# Patient Record
Sex: Male | Born: 1950 | Race: White | Hispanic: No | Marital: Single | Smoking: Current every day smoker
Health system: Southern US, Community
[De-identification: ages and names within clinical notes are randomized; demographics above are authoritative.]

## PROBLEM LIST (undated history)

## (undated) DIAGNOSIS — I5021 Acute systolic (congestive) heart failure: Secondary | ICD-10-CM

## (undated) DIAGNOSIS — E7800 Pure hypercholesterolemia, unspecified: Secondary | ICD-10-CM

## (undated) DIAGNOSIS — Z951 Presence of aortocoronary bypass graft: Secondary | ICD-10-CM

## (undated) DIAGNOSIS — Z720 Tobacco use: Secondary | ICD-10-CM

## (undated) DIAGNOSIS — J9601 Acute respiratory failure with hypoxia: Secondary | ICD-10-CM

## (undated) DIAGNOSIS — I10 Essential (primary) hypertension: Secondary | ICD-10-CM

## (undated) DIAGNOSIS — J449 Chronic obstructive pulmonary disease, unspecified: Secondary | ICD-10-CM

## (undated) HISTORY — PX: FEMORAL ARTERY - FEMORAL ARTERY BYPASS GRAFT: SUR179

## (undated) HISTORY — PX: OTHER SURGICAL HISTORY: SHX169

## (undated) HISTORY — PX: TOE AMPUTATION: SHX809

---

## 2003-01-29 ENCOUNTER — Emergency Department (HOSPITAL_COMMUNITY): Admission: EM | Admit: 2003-01-29 | Discharge: 2003-01-29 | Payer: Self-pay | Admitting: Emergency Medicine

## 2014-02-11 ENCOUNTER — Encounter (HOSPITAL_COMMUNITY): Payer: Self-pay | Admitting: Emergency Medicine

## 2014-02-11 ENCOUNTER — Inpatient Hospital Stay (HOSPITAL_COMMUNITY)
Admission: EM | Admit: 2014-02-11 | Discharge: 2014-02-20 | DRG: 233 | Disposition: A | Discharge status: Home / Self Care | Payer: BLUE CROSS/BLUE SHIELD | Attending: Thoracic Surgery (Cardiothoracic Vascular Surgery) | Admitting: Thoracic Surgery (Cardiothoracic Vascular Surgery)

## 2014-02-11 ENCOUNTER — Emergency Department (HOSPITAL_COMMUNITY): Payer: BLUE CROSS/BLUE SHIELD

## 2014-02-11 DIAGNOSIS — F1010 Alcohol abuse, uncomplicated: Secondary | ICD-10-CM | POA: Diagnosis present

## 2014-02-11 DIAGNOSIS — J9600 Acute respiratory failure, unspecified whether with hypoxia or hypercapnia: Secondary | ICD-10-CM | POA: Diagnosis present

## 2014-02-11 DIAGNOSIS — I5043 Acute on chronic combined systolic (congestive) and diastolic (congestive) heart failure: Secondary | ICD-10-CM | POA: Diagnosis present

## 2014-02-11 DIAGNOSIS — F17210 Nicotine dependence, cigarettes, uncomplicated: Secondary | ICD-10-CM | POA: Diagnosis present

## 2014-02-11 DIAGNOSIS — J9601 Acute respiratory failure with hypoxia: Secondary | ICD-10-CM | POA: Diagnosis present

## 2014-02-11 DIAGNOSIS — R9431 Abnormal electrocardiogram [ECG] [EKG]: Secondary | ICD-10-CM | POA: Diagnosis present

## 2014-02-11 DIAGNOSIS — R778 Other specified abnormalities of plasma proteins: Secondary | ICD-10-CM | POA: Diagnosis present

## 2014-02-11 DIAGNOSIS — I5022 Chronic systolic (congestive) heart failure: Secondary | ICD-10-CM | POA: Diagnosis present

## 2014-02-11 DIAGNOSIS — I471 Supraventricular tachycardia: Secondary | ICD-10-CM

## 2014-02-11 DIAGNOSIS — E7800 Pure hypercholesterolemia, unspecified: Secondary | ICD-10-CM | POA: Diagnosis present

## 2014-02-11 DIAGNOSIS — I214 Non-ST elevation (NSTEMI) myocardial infarction: Principal | ICD-10-CM | POA: Diagnosis present

## 2014-02-11 DIAGNOSIS — I25118 Atherosclerotic heart disease of native coronary artery with other forms of angina pectoris: Secondary | ICD-10-CM

## 2014-02-11 DIAGNOSIS — E222 Syndrome of inappropriate secretion of antidiuretic hormone: Secondary | ICD-10-CM | POA: Diagnosis not present

## 2014-02-11 DIAGNOSIS — J440 Chronic obstructive pulmonary disease with acute lower respiratory infection: Secondary | ICD-10-CM | POA: Diagnosis present

## 2014-02-11 DIAGNOSIS — J9811 Atelectasis: Secondary | ICD-10-CM | POA: Diagnosis not present

## 2014-02-11 DIAGNOSIS — I2582 Chronic total occlusion of coronary artery: Secondary | ICD-10-CM | POA: Diagnosis present

## 2014-02-11 DIAGNOSIS — R0602 Shortness of breath: Secondary | ICD-10-CM | POA: Diagnosis not present

## 2014-02-11 DIAGNOSIS — I5021 Acute systolic (congestive) heart failure: Secondary | ICD-10-CM | POA: Diagnosis present

## 2014-02-11 DIAGNOSIS — E872 Acidosis, unspecified: Secondary | ICD-10-CM | POA: Diagnosis present

## 2014-02-11 DIAGNOSIS — D62 Acute posthemorrhagic anemia: Secondary | ICD-10-CM | POA: Diagnosis not present

## 2014-02-11 DIAGNOSIS — I255 Ischemic cardiomyopathy: Secondary | ICD-10-CM | POA: Diagnosis present

## 2014-02-11 DIAGNOSIS — I509 Heart failure, unspecified: Secondary | ICD-10-CM | POA: Diagnosis present

## 2014-02-11 DIAGNOSIS — I10 Essential (primary) hypertension: Secondary | ICD-10-CM | POA: Diagnosis present

## 2014-02-11 DIAGNOSIS — J209 Acute bronchitis, unspecified: Secondary | ICD-10-CM | POA: Diagnosis present

## 2014-02-11 DIAGNOSIS — Z720 Tobacco use: Secondary | ICD-10-CM | POA: Diagnosis present

## 2014-02-11 DIAGNOSIS — I2699 Other pulmonary embolism without acute cor pulmonale: Secondary | ICD-10-CM

## 2014-02-11 DIAGNOSIS — Z79899 Other long term (current) drug therapy: Secondary | ICD-10-CM | POA: Diagnosis not present

## 2014-02-11 DIAGNOSIS — I2510 Atherosclerotic heart disease of native coronary artery without angina pectoris: Secondary | ICD-10-CM

## 2014-02-11 DIAGNOSIS — I2584 Coronary atherosclerosis due to calcified coronary lesion: Secondary | ICD-10-CM | POA: Diagnosis present

## 2014-02-11 DIAGNOSIS — R000 Tachycardia, unspecified: Secondary | ICD-10-CM | POA: Diagnosis not present

## 2014-02-11 DIAGNOSIS — J441 Chronic obstructive pulmonary disease with (acute) exacerbation: Secondary | ICD-10-CM | POA: Diagnosis present

## 2014-02-11 DIAGNOSIS — I25110 Atherosclerotic heart disease of native coronary artery with unstable angina pectoris: Secondary | ICD-10-CM | POA: Diagnosis present

## 2014-02-11 DIAGNOSIS — Z951 Presence of aortocoronary bypass graft: Secondary | ICD-10-CM

## 2014-02-11 DIAGNOSIS — I248 Other forms of acute ischemic heart disease: Secondary | ICD-10-CM | POA: Diagnosis present

## 2014-02-11 DIAGNOSIS — E785 Hyperlipidemia, unspecified: Secondary | ICD-10-CM | POA: Diagnosis present

## 2014-02-11 DIAGNOSIS — J449 Chronic obstructive pulmonary disease, unspecified: Secondary | ICD-10-CM | POA: Diagnosis present

## 2014-02-11 DIAGNOSIS — R7989 Other specified abnormal findings of blood chemistry: Secondary | ICD-10-CM | POA: Diagnosis present

## 2014-02-11 HISTORY — DX: Tobacco use: Z72.0

## 2014-02-11 HISTORY — DX: Pure hypercholesterolemia, unspecified: E78.00

## 2014-02-11 HISTORY — DX: Chronic obstructive pulmonary disease, unspecified: J44.9

## 2014-02-11 HISTORY — DX: Acute systolic (congestive) heart failure: I50.21

## 2014-02-11 HISTORY — DX: Presence of aortocoronary bypass graft: Z95.1

## 2014-02-11 HISTORY — DX: Essential (primary) hypertension: I10

## 2014-02-11 HISTORY — DX: Acute respiratory failure with hypoxia: J96.01

## 2014-02-11 LAB — CBC WITH DIFFERENTIAL/PLATELET
Basophils Absolute: 0 10*3/uL (ref 0.0–0.1)
Basophils Relative: 0 % (ref 0–1)
Eosinophils Absolute: 0.1 10*3/uL (ref 0.0–0.7)
Eosinophils Relative: 1 % (ref 0–5)
HCT: 40.8 % (ref 39.0–52.0)
Hemoglobin: 13.7 g/dL (ref 13.0–17.0)
Lymphocytes Relative: 10 % — ABNORMAL LOW (ref 12–46)
Lymphs Abs: 1 10*3/uL (ref 0.7–4.0)
MCH: 33.3 pg (ref 26.0–34.0)
MCHC: 33.6 g/dL (ref 30.0–36.0)
MCV: 99 fL (ref 78.0–100.0)
Monocytes Absolute: 0.6 10*3/uL (ref 0.1–1.0)
Monocytes Relative: 6 % (ref 3–12)
Neutro Abs: 8.2 10*3/uL — ABNORMAL HIGH (ref 1.7–7.7)
Neutrophils Relative %: 83 % — ABNORMAL HIGH (ref 43–77)
Platelets: 181 10*3/uL (ref 150–400)
RBC: 4.12 MIL/uL — ABNORMAL LOW (ref 4.22–5.81)
RDW: 12.9 % (ref 11.5–15.5)
WBC: 9.9 10*3/uL (ref 4.0–10.5)

## 2014-02-11 LAB — TROPONIN I
Troponin I: 0.09 ng/mL — ABNORMAL HIGH (ref <0.031)
Troponin I: 0.93 ng/mL (ref <0.031)

## 2014-02-11 LAB — COMPREHENSIVE METABOLIC PANEL
ALT: 24 U/L (ref 0–53)
AST: 33 U/L (ref 0–37)
Albumin: 3.6 g/dL (ref 3.5–5.2)
Alkaline Phosphatase: 52 U/L (ref 39–117)
Anion gap: 11 (ref 5–15)
BUN: 7 mg/dL (ref 6–23)
CO2: 23 mmol/L (ref 19–32)
Calcium: 8.9 mg/dL (ref 8.4–10.5)
Chloride: 97 mmol/L (ref 96–112)
Creatinine, Ser: 0.98 mg/dL (ref 0.50–1.35)
GFR calc Af Amer: >90 mL/min (ref >90)
GFR calc non Af Amer: 84 mL/min — ABNORMAL LOW (ref >90)
Glucose, Bld: 221 mg/dL — ABNORMAL HIGH (ref 70–99)
Potassium: 4.3 mmol/L (ref 3.5–5.1)
Sodium: 131 mmol/L — ABNORMAL LOW (ref 135–145)
Total Bilirubin: 0.5 mg/dL (ref 0.3–1.2)
Total Protein: 6.9 g/dL (ref 6.0–8.3)

## 2014-02-11 LAB — I-STAT ARTERIAL BLOOD GAS, ED
Acid-base deficit: 5 mmol/L — ABNORMAL HIGH (ref 0.0–2.0)
Bicarbonate: 20.9 mEq/L (ref 20.0–24.0)
O2 Saturation: 99 %
Patient temperature: 97.9
TCO2: 22 mmol/L (ref 0–100)
pCO2 arterial: 39.8 mmHg (ref 35.0–45.0)
pH, Arterial: 7.325 — ABNORMAL LOW (ref 7.350–7.450)
pO2, Arterial: 171 mmHg — ABNORMAL HIGH (ref 80.0–100.0)

## 2014-02-11 LAB — I-STAT CHEM 8, ED
BUN: 9 mg/dL (ref 6–23)
Calcium, Ion: 1.17 mmol/L (ref 1.13–1.30)
Chloride: 98 mmol/L (ref 96–112)
Creatinine, Ser: 0.9 mg/dL (ref 0.50–1.35)
Glucose, Bld: 220 mg/dL — ABNORMAL HIGH (ref 70–99)
HCT: 46 % (ref 39.0–52.0)
Hemoglobin: 15.6 g/dL (ref 13.0–17.0)
Potassium: 4.2 mmol/L (ref 3.5–5.1)
Sodium: 132 mmol/L — ABNORMAL LOW (ref 135–145)
TCO2: 20 mmol/L (ref 0–100)

## 2014-02-11 LAB — I-STAT CG4 LACTIC ACID, ED: Lactic Acid, Venous: 3.05 mmol/L (ref 0.5–2.0)

## 2014-02-11 LAB — I-STAT TROPONIN, ED: Troponin i, poc: 0.05 ng/mL (ref 0.00–0.08)

## 2014-02-11 LAB — TSH: TSH: 1.427 u[IU]/mL (ref 0.350–4.500)

## 2014-02-11 LAB — BRAIN NATRIURETIC PEPTIDE: B Natriuretic Peptide: 824.8 pg/mL — ABNORMAL HIGH (ref 0.0–100.0)

## 2014-02-11 MED ORDER — METHYLPREDNISOLONE SODIUM SUCC 125 MG IJ SOLR
125.0000 mg | Freq: Once | INTRAMUSCULAR | Status: AC
  Administered 2014-02-11: 125 mg via INTRAVENOUS
  Filled 2014-02-11: qty 2

## 2014-02-11 MED ORDER — GUAIFENESIN ER 600 MG PO TB12
600.0000 mg | ORAL_TABLET | Freq: Two times a day (BID) | ORAL | Status: DC
  Administered 2014-02-11 – 2014-02-16 (×11): 600 mg via ORAL
  Filled 2014-02-11 (×13): qty 1

## 2014-02-11 MED ORDER — ALBUTEROL (5 MG/ML) CONTINUOUS INHALATION SOLN
10.0000 mg/h | INHALATION_SOLUTION | RESPIRATORY_TRACT | Status: DC
  Administered 2014-02-11: 10 mg/h via RESPIRATORY_TRACT
  Filled 2014-02-11: qty 20

## 2014-02-11 MED ORDER — IPRATROPIUM-ALBUTEROL 0.5-2.5 (3) MG/3ML IN SOLN
3.0000 mL | RESPIRATORY_TRACT | Status: DC
  Administered 2014-02-11 – 2014-02-13 (×5): 3 mL via RESPIRATORY_TRACT
  Filled 2014-02-11 (×6): qty 3

## 2014-02-11 MED ORDER — METHYLPREDNISOLONE SODIUM SUCC 125 MG IJ SOLR
60.0000 mg | Freq: Four times a day (QID) | INTRAMUSCULAR | Status: DC
  Administered 2014-02-12 (×2): 60 mg via INTRAVENOUS
  Filled 2014-02-11: qty 0.96
  Filled 2014-02-11: qty 2
  Filled 2014-02-11 (×3): qty 0.96

## 2014-02-11 MED ORDER — PRAVASTATIN SODIUM 40 MG PO TABS
40.0000 mg | ORAL_TABLET | Freq: Every day | ORAL | Status: DC
  Administered 2014-02-11 – 2014-02-12 (×2): 40 mg via ORAL
  Filled 2014-02-11 (×2): qty 1

## 2014-02-11 MED ORDER — ACETAMINOPHEN 650 MG RE SUPP: 650.0000 mg | Freq: Four times a day (QID) | RECTAL | Status: DC | PRN

## 2014-02-11 MED ORDER — HEPARIN BOLUS VIA INFUSION
3000.0000 [IU] | Freq: Once | INTRAVENOUS | Status: AC
  Administered 2014-02-12: 3000 [IU] via INTRAVENOUS
  Filled 2014-02-11: qty 3000

## 2014-02-11 MED ORDER — ACETAMINOPHEN 325 MG PO TABS: 650.0000 mg | ORAL_TABLET | Freq: Four times a day (QID) | ORAL | Status: DC | PRN

## 2014-02-11 MED ORDER — CARVEDILOL 25 MG PO TABS
25.0000 mg | ORAL_TABLET | Freq: Two times a day (BID) | ORAL | Status: DC
  Administered 2014-02-12 – 2014-02-16 (×9): 25 mg via ORAL
  Filled 2014-02-11 (×13): qty 1

## 2014-02-11 MED ORDER — ENOXAPARIN SODIUM 40 MG/0.4ML ~~LOC~~ SOLN: 40.0000 mg | SUBCUTANEOUS | Status: DC

## 2014-02-11 MED ORDER — ONDANSETRON HCL 4 MG/2ML IJ SOLN: 4.0000 mg | Freq: Four times a day (QID) | INTRAMUSCULAR | Status: DC | PRN

## 2014-02-11 MED ORDER — AMLODIPINE BESYLATE 5 MG PO TABS
5.0000 mg | ORAL_TABLET | Freq: Every day | ORAL | Status: DC
  Administered 2014-02-12 – 2014-02-13 (×2): 5 mg via ORAL
  Filled 2014-02-11 (×2): qty 1

## 2014-02-11 MED ORDER — SODIUM CHLORIDE 0.9 % IJ SOLN
3.0000 mL | Freq: Two times a day (BID) | INTRAMUSCULAR | Status: DC
  Administered 2014-02-11 – 2014-02-12 (×2): 3 mL via INTRAVENOUS

## 2014-02-11 MED ORDER — ASPIRIN EC 325 MG PO TBEC
325.0000 mg | DELAYED_RELEASE_TABLET | Freq: Once | ORAL | Status: AC
  Administered 2014-02-11: 325 mg via ORAL
  Filled 2014-02-11: qty 1

## 2014-02-11 MED ORDER — AZITHROMYCIN 500 MG IV SOLR
500.0000 mg | INTRAVENOUS | Status: DC
  Administered 2014-02-11 – 2014-02-12 (×2): 500 mg via INTRAVENOUS
  Filled 2014-02-11 (×3): qty 500

## 2014-02-11 MED ORDER — ASPIRIN EC 325 MG PO TBEC
325.0000 mg | DELAYED_RELEASE_TABLET | Freq: Every day | ORAL | Status: DC
  Administered 2014-02-12 – 2014-02-16 (×5): 325 mg via ORAL
  Filled 2014-02-11 (×6): qty 1

## 2014-02-11 MED ORDER — SPIRONOLACTONE 25 MG PO TABS
25.0000 mg | ORAL_TABLET | Freq: Every day | ORAL | Status: DC
  Administered 2014-02-11 – 2014-02-16 (×6): 25 mg via ORAL
  Filled 2014-02-11 (×7): qty 1

## 2014-02-11 MED ORDER — ONDANSETRON HCL 4 MG PO TABS: 4.0000 mg | ORAL_TABLET | Freq: Four times a day (QID) | ORAL | Status: DC | PRN

## 2014-02-11 MED ORDER — DEXTROSE 5 % IV SOLN
1.0000 g | INTRAVENOUS | Status: DC
  Administered 2014-02-11: 1 g via INTRAVENOUS
  Filled 2014-02-11 (×2): qty 10

## 2014-02-11 MED ORDER — HEPARIN (PORCINE) IN NACL 100-0.45 UNIT/ML-% IJ SOLN
1000.0000 [IU]/h | INTRAMUSCULAR | Status: DC
  Administered 2014-02-12: 750 [IU]/h via INTRAVENOUS
  Filled 2014-02-11 (×3): qty 250

## 2014-02-11 NOTE — ED Notes (Signed)
Admitting MD in to assess pt for admission at this time 

## 2014-02-11 NOTE — ED Provider Notes (Signed)
CSN: [id number]     Arrival date & time 02/11/14  1910 History   First MD Initiated Contact with Patient 02/11/14 1920     Chief Complaint  Patient presents with  . Respiratory Distress     (Consider location/radiation/quality/duration/timing/severity/associated sxs/prior Treatment) Patient is a 65 y.o. male presenting with shortness of breath. The history is provided by the patient and the EMS personnel. The history is limited by the condition of the patient. No language interpreter was used.  Shortness of Breath Severity:  Severe Onset quality:  Gradual Timing:  Constant Progression:  Worsening Chronicity:  New Relieved by:  Nothing Worsened by:  Nothing tried Ineffective treatments:  None tried Associated symptoms: wheezing   Associated symptoms: no abdominal pain, no chest pain, no cough and no fever   Wheezing:    Severity:  Moderate   Onset quality:  Gradual   Timing:  Constant   Progression:  Unchanged   Chronicity:  New   Past Medical History  Diagnosis Date  . Hypertension   . High cholesterol    History reviewed. No pertinent past surgical history. History reviewed. No pertinent family history. History  Substance Use Topics  . Smoking status: Current Every Day Smoker    Types: Cigarettes  . Smokeless tobacco: Not on file  . Alcohol Use: Yes     Comment: Six pack a day of beer    Review of Systems  Unable to perform ROS: Acuity of condition  Constitutional: Negative for fever.  Respiratory: Positive for shortness of breath and wheezing. Negative for cough.   Cardiovascular: Negative for chest pain.  Gastrointestinal: Negative for abdominal pain.      Allergies  Review of patient's allergies indicates no known allergies.  Home Medications   Prior to Admission medications   Medication Sig Start Date End Date Taking? Authorizing Provider  amLODipine (NORVASC) 5 MG tablet Take 5 mg by mouth daily.   Yes Historical Provider, MD  carvedilol (COREG)  25 MG tablet Take 25 mg by mouth 2 (two) times daily with a meal.   Yes Historical Provider, MD  pravastatin (PRAVACHOL) 40 MG tablet Take 40 mg by mouth daily.   Yes Historical Provider, MD  spironolactone (ALDACTONE) 25 MG tablet Take 25 mg by mouth daily.   Yes Historical Provider, MD   BP 153/91 mmHg  Pulse 98  Temp(Src) 97 F (36.1 C) (Axillary)  Resp 24  Ht 5\' 5"  (1.651 m)  Wt 143 lb 1.3 oz (64.9 kg)  BMI 23.81 kg/m2  SpO2 95% Physical Exam  Constitutional: He is oriented to person, place, and time. He appears well-developed and well-nourished. He appears lethargic. He appears ill. He appears distressed.  HENT:  Head: Normocephalic and atraumatic.  Eyes: Pupils are equal, round, and reactive to light.  Neck: Normal range of motion.  Cardiovascular: Normal rate, regular rhythm and normal heart sounds.   Pulmonary/Chest: He is in respiratory distress (Severe, unable to answer yes or no questions.). He has decreased breath sounds in the right middle field, the right lower field, the left middle field and the left lower field. He has no wheezes. He has no rhonchi. He has no rales.  Abdominal: Soft. He exhibits no distension. There is no tenderness. There is no rebound and no guarding.  Musculoskeletal: He exhibits no edema or tenderness.  Neurological: He is oriented to person, place, and time. He appears lethargic. He exhibits normal muscle tone.  Skin: Skin is warm and dry.  Nursing note and  vitals reviewed.   ED Course  Procedures (including critical care time) Labs Review Labs Reviewed  COMPREHENSIVE METABOLIC PANEL - Abnormal; Notable for the following:    Sodium 131 (*)    Glucose, Bld 221 (*)    GFR calc non Af Amer 84 (*)    All other components within normal limits  TROPONIN I - Abnormal; Notable for the following:    Troponin I 0.09 (*)    All other components within normal limits  CBC WITH DIFFERENTIAL/PLATELET - Abnormal; Notable for the following:    RBC 4.12  (*)    Neutrophils Relative % 83 (*)    Neutro Abs 8.2 (*)    Lymphocytes Relative 10 (*)    All other components within normal limits  TROPONIN I - Abnormal; Notable for the following:    Troponin I 0.93 (*)    All other components within normal limits  TROPONIN I - Abnormal; Notable for the following:    Troponin I 1.27 (*)    All other components within normal limits  TROPONIN I - Abnormal; Notable for the following:    Troponin I 2.17 (*)    All other components within normal limits  BRAIN NATRIURETIC PEPTIDE - Abnormal; Notable for the following:    B Natriuretic Peptide 824.8 (*)    All other components within normal limits  HEPARIN LEVEL (UNFRACTIONATED) - Abnormal; Notable for the following:    Heparin Unfractionated 0.10 (*)    All other components within normal limits  I-STAT ARTERIAL BLOOD GAS, ED - Abnormal; Notable for the following:    pH, Arterial 7.325 (*)    pO2, Arterial 171.0 (*)    Acid-base deficit 5.0 (*)    All other components within normal limits  I-STAT CG4 LACTIC ACID, ED - Abnormal; Notable for the following:    Lactic Acid, Venous 3.05 (*)    All other components within normal limits  I-STAT CHEM 8, ED - Abnormal; Notable for the following:    Sodium 132 (*)    Glucose, Bld 220 (*)    All other components within normal limits  MRSA PCR SCREENING  TSH  INFLUENZA PANEL BY PCR (TYPE A & B, H1N1)  HEMOGLOBIN A1C  CBC  HEPARIN LEVEL (UNFRACTIONATED)  Randolm Idol, ED    Imaging Review Dg Chest Port 1 View  02/11/2014   CLINICAL DATA:  Respiratory distress.  Smoker.  Hypertension.  EXAM: PORTABLE CHEST - 1 VIEW  COMPARISON:  None.  FINDINGS: The heart size and mediastinal contours are within normal limits. Both lungs are clear. Pulmonary hyperinflation noted and suspicious for COPD. No evidence of pleural effusion or pneumothorax. The visualized skeletal structures are unremarkable.  IMPRESSION: Pulmonary hyperinflation, suspicious for COPD.  No  acute findings.   Electronically Signed   By: Earle Gell M.D.   On: 02/11/2014 19:34     EKG Interpretation   Date/Time:  Thursday February 11 2014 19:14:08 EST Ventricular Rate:  98 PR Interval:  165 QRS Duration: 105 QT Interval:  381 QTC Calculation: 486 R Axis:   92 Text Interpretation:  Normal sinus rhythm Probable inferior infarct,  recent Abnormal lateral Q waves Abnrm T, consider ischemia, anterolateral  lds No old tracing to compare Confirmed by GOLDSTON  MD, [place] ([id number]) on  02/11/2014 8:01:00 PM      MDM   Final diagnoses:  COPD with acute exacerbation  Elevated troponin  Abnormal EKG  Acute respiratory failure with hypoxia   Patient is a  64 year old Caucasian male with no pertinent past medical history however he has not been evaluated by a physician for several years who comes to the emergency department today with shortness of breath for the past week. Physical exam as above. In route to the emergency Department patient had significant hypoxia with his highest oxygen saturation being 85% on CPAP. Per EMS he had wheezing in the field and was treated with DuoNeb with moderate improvement. Upon arrival to the emergency department patient's O2 saturation is 81% on CPAP. Patient was transitioned to BiPAP with improvement in his oxygen saturations to 95%. Patient does appear lethargic with significant respiratory distress.  Patient does express improvement after being placed on BiPAP as a result we will not intubate on this time and will provide the patient with a trial of BiPAP.  Initial workup included a lactic acid, chem 8, troponin, CMP, CBC, chest x-ray, ABG, and an EKG.  Chest x-ray demonstrated no consolidations. There was pulmonary hyperinflation concerning for potential COPD. Patient has never been diagnosed with COPD in the past.  With no consolidations on chest x-ray doubt a pneumonia. Patient was treated with a continuous DuoNeb and Solu-Medrol after the results of his  chest x-ray. After being on the continuous DuoNeb, BiPAP and Solu-Medrol for approximately an hour patient had significant improvement in his shortness of breath and mental status. With improvement on treatment with COPD medications I doubt PE. The remainder of the workup demonstrated a pH of 7.325 with a PCO2 of 39. CBC was unremarkable. Troponin was elevated to 0.09. With the patient's hypoxia and significant shortness of breath this is likely secondary to demand however he was treated with aspirin in the event that this is secondary to ACS. Lactic acid was elevated at 3.05 which is consistent with the patient's significant respiratory distress prior to arrival. Patient was treated with fluids. Patient was felt to require admission to stepdown unit for BiPAP and a potential COPD exacerbation. The patient was admitted to the hospitalist service in a good condition. Labs and imaging were reviewed by myself and considered in medical decision-making. Imaging was interpreted by radiology. Care was discussed with my attending Dr. Regenia Skeeter.   Katheren Shams, MD 02/12/14 1204  Ephraim Hamburger, MD 02/20/14 [phone]

## 2014-02-11 NOTE — ED Notes (Signed)
Pt to ED via [facility] with c/o resp. Distress.  Wife reported to EMS that pt has had a cough for approx 1 week, after coming home from work today pt started to have resp. Distress.  On EMS arrival pt very pale and  diaphoretic and in tripod position.  Pt was given 2 neb tx enroute to hospital and placed on c-pap.  EMS reports pt was on c-pap for approx 10 mins before having 02 sats greater than 90.

## 2014-02-11 NOTE — ED Notes (Signed)
CRITICAL VALUE ALERT  Critical value received:  Lactic Acid 3.05  Date of notification:  02/11/2014  Time of notification:  1937  Critical value read back:Yes.    Nurse who received alert:  MG Edla Para,RN  MD notified (1st page):  Dr. Regenia Skeeter  Time of first page:  1937  MD notified (2nd page):  Time of second page:  Responding MD:  Dr. Regenia Skeeter  Time MD responded: [phone]

## 2014-02-11 NOTE — H&P (Signed)
[facility] History and Physical  Patient: Caleb [name]  MRN: [id number]  DOB: 26-Oct-1950  DOS: the patient was seen and examined on 02/11/2014 PCP: No primary care provider on file.  Chief Complaint: Shortness of breath  HPI: Caleb [name] is a 64 y.o. male with Past medical history of hypertension, active smoker. The patient presented with complains of shortness of breath. Patient today went to his wife with the complaint of severe shortness of breath and requested to call EMS. A she mentions that he has been having cough with a runny nose for last 1 week. Denies any fevers denies any chest pain denies any palpitation. He has been having progressively worsening shortness of breath as well over last 1 week and when EMS saw the patient the patient was diaphoretic and pale and was in significant respiratory distress. Patient was initially placed on C Pap and was brought here. At the time of my evaluation patient mentions his breathing was significantly better he continues to denies any complaint of chest pain. He denies any complaints of chest pain shortness of breath even on exertion prior to this one week episodes. He denies any fever or chills denies any nausea or vomiting denies any choking episode. Denies any recent travel surgeries or immobilization. Denies any prior diagnosis of COPD or heart failure or chest coronary artery disease. Patient is an active smoker smokes one to 2 pack a day. Patient mentions he drinks 6-7 cans of beer on a daily basis.  The patient is coming from home. And at his baseline independent for most of his ADL.  Review of Systems: as mentioned in the history of present illness.  A Comprehensive review of the other systems is negative.  Past Medical History  Diagnosis Date  . Hypertension   . High cholesterol    History reviewed. No pertinent past surgical history. Social History:  reports that he has been smoking Cigarettes.  He does not have any  smokeless tobacco history on file. He reports that he drinks alcohol. His drug history is not on file.  No Known Allergies  No family history on file.  Prior to Admission medications   Medication Sig Start Date End Date Taking? Authorizing Provider  amLODipine (NORVASC) 5 MG tablet Take 5 mg by mouth daily.   Yes Historical Provider, MD  carvedilol (COREG) 25 MG tablet Take 25 mg by mouth 2 (two) times daily with a meal.   Yes Historical Provider, MD  pravastatin (PRAVACHOL) 40 MG tablet Take 40 mg by mouth daily.   Yes Historical Provider, MD  spironolactone (ALDACTONE) 25 MG tablet Take 25 mg by mouth daily.   Yes Historical Provider, MD    Physical Exam: Filed Vitals:   02/11/14 2015 02/11/14 2030 02/11/14 2045 02/11/14 2051  BP: 151/78 149/81 159/86 159/86  Pulse: 89 87 93 93  Temp:      TempSrc:      Resp: 25 25 22 19   Height:      Weight:      SpO2: 100% 100% 100% 100%    General: Alert, Awake and Oriented to Time, Place and Person. Appear in mild distress Eyes: PERRL ENT: Oral Mucosa clear moist. Neck: mild JVD Cardiovascular: S1 and S2 Present, no Murmur, Peripheral Pulses Present Respiratory: Bilateral Air entry equal and Decreased, faint bilateral Crackles, no wheezes Abdomen: Bowel Sound present, Soft and no tender Skin: no Rash Extremities: no Pedal edema, no calf tenderness Neurologic: Grossly no focal neuro deficit.  Labs  on Admission:  CBC:  Recent Labs Lab 02/11/14 1930 02/11/14 1939  WBC 9.9  --   NEUTROABS 8.2*  --   HGB 13.7 15.6  HCT 40.8 46.0  MCV 99.0  --   PLT 181  --     CMP     Component Value Date/Time   NA 132* 02/11/2014 1939   K 4.2 02/11/2014 1939   CL 98 02/11/2014 1939   CO2 23 02/11/2014 1930   GLUCOSE 220* 02/11/2014 1939   BUN 9 02/11/2014 1939   CREATININE 0.90 02/11/2014 1939   CALCIUM 8.9 02/11/2014 1930   PROT 6.9 02/11/2014 1930   ALBUMIN 3.6 02/11/2014 1930   AST 33 02/11/2014 1930   ALT 24 02/11/2014 1930    ALKPHOS 52 02/11/2014 1930   BILITOT 0.5 02/11/2014 1930   GFRNONAA 84* 02/11/2014 1930   GFRAA >90 02/11/2014 1930    No results for input(s): LIPASE, AMYLASE in the last 168 hours.   Recent Labs Lab 02/11/14 1930  TROPONINI 0.09*   BNP (last 3 results) No results for input(s): PROBNP in the last 8760 hours.  Radiological Exams on Admission: Dg Chest Port 1 View  02/11/2014   CLINICAL DATA:  Respiratory distress.  Smoker.  Hypertension.  EXAM: PORTABLE CHEST - 1 VIEW  COMPARISON:  None.  FINDINGS: The heart size and mediastinal contours are within normal limits. Both lungs are clear. Pulmonary hyperinflation noted and suspicious for COPD. No evidence of pleural effusion or pneumothorax. The visualized skeletal structures are unremarkable.  IMPRESSION: Pulmonary hyperinflation, suspicious for COPD.  No acute findings.   Electronically Signed   By: Earle Gell M.D.   On: 02/11/2014 19:34    EKG: Independently reviewed. normal sinus rhythm, nonspecific ST and T waves changes.  Assessment/Plan Principal Problem:   COPD with acute exacerbation Active Problems:   Elevated troponin   Abnormal EKG   Acute respiratory failure   1. COPD with acute exacerbation The patient presented with complaints of shortness of breath, cough;  he had expiratory wheezing, respiratory distress, tachypnea, with use of accessory muscles of respiration, hypoxia. The patient has evidence of acute hypoxic respiratory failure.  Patient will be admitted for COPD exacerbation secondary to acute bronchitis. I will get sputum culture, urine antigens, influenza PCR. I will treat the patient with IV Solu-Medrol 60 mg Q6 hours, DUONEB every 4 hours, oxygen as needed, IV antibiotics ceftriaxone azithromycin. Mucinex as needed. BiPAP as needed  2. Elevated troponin. The patient presented with respiratory symptoms. His EKG showed nonspecific ST-T wave changes. His initial troponin was negative and on recheck was  elevated. He did receive aspirin. Patient denies any complaints of chest pain or exertional dyspnea. At present we will follow serial troponin.  3. Hypertension Patient has history of hypertension but is not compliant with his medications. At present I would resume his home medications. Continue close monitoring in stepdown.  4. Active smoker. Patient was counseled against smoking. Patient wants to quit smoking. We'll start with nicotine patch.  5. Alcohol abuse. Patient drinks 6-7 beers a day last drink was today. At present he does not have any evidence of withdrawal. I would place him on CIWA protocol and thiamine and folate acid  Advance goals of care discussion: Full code   DVT Prophylaxis: subcutaneous Heparin Nutrition: Cardiac diet  Family Communication: Family was present at bedside, opportunity was given to ask question and all questions were answered satisfactorily at the time of interview. Disposition: Admitted to inpatient in  step-down unit.  Author: Berle Mull, MD [facility] Pager: [phone] 02/11/2014, 9:29 PM    If 7PM-7AM, please contact night-coverage [email or web address] Password [email or web address]  Addendum: Patient's repeat troponin was significantly elevated from 0.09-0.93. Patient continues to deny any complaints of chest pain. Cardiology was consulted. Patient was placed on heparin for therapeutic anticoagulation. Aspirin 325. Echo program in the morning. Patient will be placed nothing by mouth.  Geroge Gilliam 5:16 AM 02/12/2014

## 2014-02-11 NOTE — ED Notes (Signed)
Dr. Goldston notified of elevated CG-4 

## 2014-02-11 NOTE — Progress Notes (Signed)
CRITICAL VALUE ALERT  Critical value received:  Troponin 0.93  Date of notification: 02/11/2014  Time of notification:  2325  Critical value read back:yes  Nurse who received alert:  Montez Morita, RN  MD notified (1st page):  Berle Mull, MD  Time of first page:  2330  Responding MD:  Berle Mull, MD  Time MD responded:  [phone]

## 2014-02-11 NOTE — Progress Notes (Signed)
Pt removed from BiPap per MD order. Pt placed on 4L [place]. Sats 93-94%. WOB is normal, no distress. Pt states he can breathe a lot better that before he came. Pt states he is comfortable.

## 2014-02-11 NOTE — ED Notes (Signed)
Attempted report 

## 2014-02-11 NOTE — ED Notes (Signed)
Pt tolerating bi-pap at this time with no complaints voiced.  Family at bedside

## 2014-02-11 NOTE — Progress Notes (Signed)
ANTICOAGULATION CONSULT NOTE - Initial Consult  Pharmacy Consult for heparin Indication: chest pain/ACS  No Known Allergies  Patient Measurements: Height: 5\' 5"  (165.1 cm) Weight: 140 lb (63.504 kg) IBW/kg (Calculated) : 61.5  Vital Signs: Temp: 97.7 F (36.5 C) (01/28 1917) Temp Source: Axillary (01/28 1917) BP: 156/87 mmHg (01/28 2130) Pulse Rate: 94 (01/28 2235)  Labs:  Recent Labs  02/11/14 1930 02/11/14 1939 02/11/14 2207  HGB 13.7 15.6  --   HCT 40.8 46.0  --   PLT 181  --   --   CREATININE 0.98 0.90  --   TROPONINI 0.09*  --  0.93*    Estimated Creatinine Clearance: 71.2 mL/min (by C-G formula based on Cr of 0.9).   Medical History: Past Medical History  Diagnosis Date  . Hypertension   . High cholesterol     Medications:  Prescriptions prior to admission  Medication Sig Dispense Refill Last Dose  . amLODipine (NORVASC) 5 MG tablet Take 5 mg by mouth daily.   Past Week at Unknown time  . carvedilol (COREG) 25 MG tablet Take 25 mg by mouth 2 (two) times daily with a meal.   02/11/2014 at 0800  . pravastatin (PRAVACHOL) 40 MG tablet Take 40 mg by mouth daily.   Past Week at Unknown time  . spironolactone (ALDACTONE) 25 MG tablet Take 25 mg by mouth daily.   Past Week at Unknown time   Scheduled:  . [START ON 02/12/2014] amLODipine  5 mg Oral Daily  . [START ON 02/12/2014] aspirin EC  325 mg Oral Daily  . azithromycin  500 mg Intravenous Q24H  . [START ON 02/12/2014] carvedilol  25 mg Oral BID WC  . cefTRIAXone (ROCEPHIN)  IV  1 g Intravenous Q24H  . [START ON 02/12/2014] enoxaparin (LOVENOX) injection  40 mg Subcutaneous Q24H  . guaiFENesin  600 mg Oral BID  . ipratropium-albuterol  3 mL Nebulization Q4H  . methylPREDNISolone (SOLU-MEDROL) injection  60 mg Intravenous Q6H  . pravastatin  40 mg Oral Daily  . sodium chloride  3 mL Intravenous Q12H  . spironolactone  25 mg Oral Daily   Infusions:  . albuterol 10 mg/hr (02/11/14 2051)     Assessment: 64yo male c/o respiratory distress, admitted for COPD exacerbation, w/ elevated and rising troponin, to begin heparin.  Goal of Therapy:  Heparin level 0.3-0.7 units/ml Monitor platelets by anticoagulation protocol: Yes   Plan:  Will give heparin 3000 units IV bolus x1 followed by gtt at 750 units/hr and monitor heparin levels and CBC.  Wynona Neat, PharmD, BCPS  02/11/2014,11:31 PM

## 2014-02-11 NOTE — ED Notes (Signed)
Port CXR at bedside at this time.  Pt tolerating c-pap.

## 2014-02-11 NOTE — Progress Notes (Signed)
Pt was placed back on BiPAP prior to transferring to 2C14. Pt was on 5L [place] w/ Sats 86-88%. Pt was is no distress, unable to maintain Sats at this time. Report given to RT for 2C and patient transported without complication.

## 2014-02-12 ENCOUNTER — Encounter (HOSPITAL_COMMUNITY)
Admission: EM | Disposition: A | Discharge status: Home / Self Care | Payer: Self-pay | Source: Home / Self Care | Attending: Internal Medicine

## 2014-02-12 DIAGNOSIS — I2510 Atherosclerotic heart disease of native coronary artery without angina pectoris: Secondary | ICD-10-CM

## 2014-02-12 DIAGNOSIS — E872 Acidosis, unspecified: Secondary | ICD-10-CM | POA: Diagnosis present

## 2014-02-12 DIAGNOSIS — R072 Precordial pain: Secondary | ICD-10-CM

## 2014-02-12 DIAGNOSIS — J9601 Acute respiratory failure with hypoxia: Secondary | ICD-10-CM | POA: Diagnosis present

## 2014-02-12 DIAGNOSIS — R000 Tachycardia, unspecified: Secondary | ICD-10-CM | POA: Diagnosis present

## 2014-02-12 DIAGNOSIS — R7989 Other specified abnormal findings of blood chemistry: Secondary | ICD-10-CM

## 2014-02-12 DIAGNOSIS — R778 Other specified abnormalities of plasma proteins: Secondary | ICD-10-CM | POA: Diagnosis present

## 2014-02-12 HISTORY — PX: LEFT HEART CATHETERIZATION WITH CORONARY ANGIOGRAM: SHX5451

## 2014-02-12 LAB — CREATININE, SERUM
Creatinine, Ser: 0.83 mg/dL (ref 0.50–1.35)
GFR calc Af Amer: >90 mL/min (ref >90)
GFR calc non Af Amer: >90 mL/min (ref >90)

## 2014-02-12 LAB — CBC
HCT: 41.3 % (ref 39.0–52.0)
HCT: 42 % (ref 39.0–52.0)
Hemoglobin: 14.3 g/dL (ref 13.0–17.0)
Hemoglobin: 14.7 g/dL (ref 13.0–17.0)
MCH: 33.4 pg (ref 26.0–34.0)
MCH: 34.5 pg — ABNORMAL HIGH (ref 26.0–34.0)
MCHC: 34.6 g/dL (ref 30.0–36.0)
MCHC: 35 g/dL (ref 30.0–36.0)
MCV: 96.5 fL (ref 78.0–100.0)
MCV: 98.6 fL (ref 78.0–100.0)
Platelets: 173 10*3/uL (ref 150–400)
Platelets: 168 10*3/uL (ref 150–400)
RBC: 4.28 MIL/uL (ref 4.22–5.81)
RBC: 4.26 MIL/uL (ref 4.22–5.81)
RDW: 12.9 % (ref 11.5–15.5)
RDW: 12.9 % (ref 11.5–15.5)
WBC: 11 10*3/uL — ABNORMAL HIGH (ref 4.0–10.5)
WBC: 10.6 10*3/uL — ABNORMAL HIGH (ref 4.0–10.5)

## 2014-02-12 LAB — TROPONIN I
Troponin I: 1.27 ng/mL (ref <0.031)
Troponin I: 2.17 ng/mL (ref <0.031)

## 2014-02-12 LAB — INFLUENZA PANEL BY PCR (TYPE A & B)
H1N1 flu by pcr: NOT DETECTED
Influenza A By PCR: NEGATIVE
Influenza B By PCR: NEGATIVE

## 2014-02-12 LAB — MRSA PCR SCREENING: MRSA by PCR: NEGATIVE

## 2014-02-12 LAB — HEPARIN LEVEL (UNFRACTIONATED)
Heparin Unfractionated: 0.1 IU/mL — ABNORMAL LOW (ref 0.30–0.70)
Heparin Unfractionated: 0.2 IU/mL — ABNORMAL LOW (ref 0.30–0.70)

## 2014-02-12 SURGERY — LEFT HEART CATHETERIZATION WITH CORONARY ANGIOGRAM
Anesthesia: LOCAL

## 2014-02-12 MED ORDER — SODIUM CHLORIDE 0.9 % IV SOLN: 250.0000 mL | INTRAVENOUS | Status: DC | PRN

## 2014-02-12 MED ORDER — CETYLPYRIDINIUM CHLORIDE 0.05 % MT LIQD
7.0000 mL | Freq: Two times a day (BID) | OROMUCOSAL | Status: DC
Start: 2014-02-12 — End: 2014-02-14
  Administered 2014-02-13 (×2): 7 mL via OROMUCOSAL

## 2014-02-12 MED ORDER — FENTANYL CITRATE 0.05 MG/ML IJ SOLN
INTRAMUSCULAR | Status: AC
  Filled 2014-02-12: qty 2

## 2014-02-12 MED ORDER — LIDOCAINE HCL (PF) 1 % IJ SOLN
INTRAMUSCULAR | Status: AC
  Filled 2014-02-12: qty 30

## 2014-02-12 MED ORDER — LORAZEPAM 1 MG PO TABS
1.0000 mg | ORAL_TABLET | Freq: Four times a day (QID) | ORAL | Status: DC | PRN
Start: 2014-02-12 — End: 2014-02-15
  Administered 2014-02-14: 1 mg via ORAL
  Filled 2014-02-12: qty 1

## 2014-02-12 MED ORDER — FUROSEMIDE 10 MG/ML IJ SOLN
40.0000 mg | Freq: Once | INTRAMUSCULAR | Status: AC
  Administered 2014-02-12: 40 mg via INTRAMUSCULAR

## 2014-02-12 MED ORDER — ENOXAPARIN SODIUM 80 MG/0.8ML ~~LOC~~ SOLN
1.0000 mg/kg | Freq: Two times a day (BID) | SUBCUTANEOUS | Status: DC
  Administered 2014-02-13 – 2014-02-15 (×5): 65 mg via SUBCUTANEOUS
  Filled 2014-02-12 (×7): qty 0.8

## 2014-02-12 MED ORDER — VITAMIN B-1 100 MG PO TABS
100.0000 mg | ORAL_TABLET | Freq: Every day | ORAL | Status: DC
  Administered 2014-02-12 – 2014-02-16 (×5): 100 mg via ORAL
  Filled 2014-02-12 (×6): qty 1

## 2014-02-12 MED ORDER — HEPARIN BOLUS VIA INFUSION
2000.0000 [IU] | Freq: Once | INTRAVENOUS | Status: AC
  Administered 2014-02-12: 2000 [IU] via INTRAVENOUS
  Filled 2014-02-12: qty 2000

## 2014-02-12 MED ORDER — NITROGLYCERIN 1 MG/10 ML FOR IR/CATH LAB
INTRA_ARTERIAL | Status: AC
  Filled 2014-02-12: qty 10

## 2014-02-12 MED ORDER — HEPARIN SODIUM (PORCINE) 1000 UNIT/ML IJ SOLN
INTRAMUSCULAR | Status: AC
  Filled 2014-02-12: qty 1

## 2014-02-12 MED ORDER — VERAPAMIL HCL 2.5 MG/ML IV SOLN
INTRAVENOUS | Status: AC
  Filled 2014-02-12: qty 2

## 2014-02-12 MED ORDER — CHLORHEXIDINE GLUCONATE 0.12 % MT SOLN
15.0000 mL | Freq: Two times a day (BID) | OROMUCOSAL | Status: DC
  Administered 2014-02-12 – 2014-02-14 (×4): 15 mL via OROMUCOSAL
  Filled 2014-02-12 (×5): qty 15

## 2014-02-12 MED ORDER — SODIUM CHLORIDE 0.9 % IJ SOLN: 3.0000 mL | INTRAMUSCULAR | Status: DC | PRN

## 2014-02-12 MED ORDER — HEPARIN (PORCINE) IN NACL 100-0.45 UNIT/ML-% IJ SOLN
1200.0000 [IU]/h | INTRAMUSCULAR | Status: DC
  Filled 2014-02-12: qty 250

## 2014-02-12 MED ORDER — SODIUM CHLORIDE 0.9 % IV SOLN: INTRAVENOUS | Status: DC

## 2014-02-12 MED ORDER — FUROSEMIDE 10 MG/ML IJ SOLN
INTRAMUSCULAR | Status: AC
  Filled 2014-02-12: qty 4

## 2014-02-12 MED ORDER — OXYCODONE-ACETAMINOPHEN 5-325 MG PO TABS: 1.0000 | ORAL_TABLET | ORAL | Status: DC | PRN

## 2014-02-12 MED ORDER — HEPARIN (PORCINE) IN NACL 2-0.9 UNIT/ML-% IJ SOLN
INTRAMUSCULAR | Status: AC
  Filled 2014-02-12: qty 1000

## 2014-02-12 MED ORDER — HYDRALAZINE HCL 25 MG PO TABS
25.0000 mg | ORAL_TABLET | Freq: Three times a day (TID) | ORAL | Status: DC
  Administered 2014-02-12 – 2014-02-16 (×13): 25 mg via ORAL
  Filled 2014-02-12 (×18): qty 1

## 2014-02-12 MED ORDER — ONDANSETRON HCL 4 MG/2ML IJ SOLN: 4.0000 mg | Freq: Four times a day (QID) | INTRAMUSCULAR | Status: DC | PRN

## 2014-02-12 MED ORDER — SODIUM CHLORIDE 0.9 % IJ SOLN: 3.0000 mL | Freq: Two times a day (BID) | INTRAMUSCULAR | Status: DC

## 2014-02-12 MED ORDER — LORAZEPAM 2 MG/ML IJ SOLN: 1.0000 mg | Freq: Four times a day (QID) | INTRAMUSCULAR | Status: DC | PRN

## 2014-02-12 MED ORDER — ACETAMINOPHEN 325 MG PO TABS: 650.0000 mg | ORAL_TABLET | ORAL | Status: DC | PRN

## 2014-02-12 MED ORDER — THIAMINE HCL 100 MG/ML IJ SOLN
100.0000 mg | Freq: Every day | INTRAMUSCULAR | Status: DC
  Filled 2014-02-12 (×2): qty 1

## 2014-02-12 MED ORDER — ADULT MULTIVITAMIN W/MINERALS CH
1.0000 | ORAL_TABLET | Freq: Every day | ORAL | Status: DC
  Administered 2014-02-12 – 2014-02-16 (×5): 1 via ORAL
  Filled 2014-02-12 (×6): qty 1

## 2014-02-12 MED ORDER — FOLIC ACID 1 MG PO TABS
1.0000 mg | ORAL_TABLET | Freq: Every day | ORAL | Status: DC
  Administered 2014-02-12 – 2014-02-16 (×5): 1 mg via ORAL
  Filled 2014-02-12 (×6): qty 1

## 2014-02-12 MED ORDER — ASPIRIN 81 MG PO CHEW
81.0000 mg | CHEWABLE_TABLET | ORAL | Status: DC
Start: 2014-02-13 — End: 2014-02-12

## 2014-02-12 MED ORDER — PREDNISONE 50 MG PO TABS
50.0000 mg | ORAL_TABLET | Freq: Every day | ORAL | Status: DC
  Administered 2014-02-13: 50 mg via ORAL
  Filled 2014-02-12 (×2): qty 1

## 2014-02-12 MED ORDER — IPRATROPIUM-ALBUTEROL 0.5-2.5 (3) MG/3ML IN SOLN
RESPIRATORY_TRACT | Status: AC
  Administered 2014-02-12: 3 mL
  Filled 2014-02-12: qty 3

## 2014-02-12 NOTE — Progress Notes (Signed)
Utilization Review Completed.  

## 2014-02-12 NOTE — Progress Notes (Signed)
Patient Demographics  Caleb [name], is a 64 y.o. male, DOB - [birth year], CNO:[id number]  Admit date - 02/11/2014   Admitting Physician Berle Mull, MD  Outpatient Primary MD for the patient is No primary care provider on file.  LOS - 1   Chief Complaint  Patient presents with  . Respiratory Distress        Subjective:   Caleb [name] today has, No headache, No chest pain, No abdominal pain - No Nausea, No new weakness tingling or numbness, No Cough -  Improved SOB.    Assessment & Plan    1. COPD with mild acute exacerbation - ABG not impressive, no wheezing on exam, tapered down to oral steroids along with oxygen and nebulizer treatments as needed.    2. NSTEMI Elevated troponin. This was the primary problem causing shortness of breath, cardiology on board, currently on heparin drip along with aspirin, beta blocker and statin for secondary prevention. We'll defer further management to cardiology. He is chest pain-free. Obtain echogram.    3. Essential Hypertension - poor compliance with home medications, home medications resumed and added hydralazine for better control, monitor blood pressure.    4. Ongoing smoking and I'll call abuse. Counseled to quit both. CIWA protocol.    Code Status: Full  Family Communication: None present  Disposition Plan: Home   Procedures   TTE   Consults Cardiology   Medications  Scheduled Meds: . amLODipine  5 mg Oral Daily  . antiseptic oral rinse  7 mL Mouth Rinse q12n4p  . aspirin EC  325 mg Oral Daily  . azithromycin  500 mg Intravenous Q24H  . carvedilol  25 mg Oral BID WC  . chlorhexidine  15 mL Mouth Rinse BID  . folic acid  1 mg Oral Daily  . guaiFENesin  600 mg Oral BID  . ipratropium-albuterol  3 mL Nebulization Q4H  .  multivitamin with minerals  1 tablet Oral Daily  . pravastatin  40 mg Oral Daily  . [START ON 02/13/2014] predniSONE  50 mg Oral Q breakfast  . sodium chloride  3 mL Intravenous Q12H  . spironolactone  25 mg Oral Daily  . thiamine  100 mg Oral Daily   Or  . thiamine  100 mg Intravenous Daily   Continuous Infusions: . albuterol 10 mg/hr (02/11/14 2051)  . heparin 1,000 Units/hr (02/12/14 0805)   PRN Meds:.acetaminophen **OR** [DISCONTINUED] acetaminophen, LORazepam **OR** LORazepam, ondansetron **OR** ondansetron (ZOFRAN) IV  DVT Prophylaxis   Heparin   Gtt  Lab Results  Component Value Date   PLT 181 02/11/2014    Antibiotics     Anti-infectives    Start     Dose/Rate Route Frequency Ordered Stop   02/11/14 2130  cefTRIAXone (ROCEPHIN) 1 g in dextrose 5 % 50 mL IVPB  Status:  Discontinued     1 g100 mL/hr over 30 Minutes Intravenous Every 24 hours 02/11/14 2128 02/12/14 1139   02/11/14 2130  azithromycin (ZITHROMAX) 500 mg in dextrose 5 % 250 mL IVPB     500 mg250 mL/hr over 60 Minutes Intravenous Every 24 hours 02/11/14 2128            Objective:   Filed Vitals:   02/12/14 0400 02/12/14  0500 02/12/14 0810 02/12/14 0827  BP: 153/89   153/91  Pulse: 97  107 98  Temp: 97 F (36.1 C)   97 F (36.1 C)  TempSrc: Oral   Axillary  Resp: 23  23 24   Height:      Weight:  64.9 kg (143 lb 1.3 oz)    SpO2: 97%  88% 95%    Wt Readings from Last 3 Encounters:  02/12/14 64.9 kg (143 lb 1.3 oz)     Intake/Output Summary (Last 24 hours) at 02/12/14 1140 Last data filed at 02/12/14 0947  Gross per 24 hour  Intake    365 ml  Output    350 ml  Net     15 ml     Physical Exam  Awake Alert, Oriented X 3, No new F.N deficits, Normal affect [place].AT,PERRAL Supple Neck,No JVD, No cervical lymphadenopathy appriciated.  Symmetrical Chest wall movement, Good air movement bilaterally, CTAB RRR,No Gallops,Rubs or new Murmurs, No Parasternal Heave +ve B.Sounds, Abd Soft, No  tenderness, No organomegaly appriciated, No rebound - guarding or rigidity. No Cyanosis, Clubbing or edema, No new Rash or bruise      Data Review   Micro Results Recent Results (from the past 240 hour(s))  MRSA PCR Screening     Status: None   Collection Time: 02/11/14 10:41 PM  Result Value Ref Range Status   MRSA by PCR NEGATIVE NEGATIVE Final    Comment:        The GeneXpert MRSA Assay (FDA approved for NASAL specimens only), is one component of a comprehensive MRSA colonization surveillance program. It is not intended to diagnose MRSA infection nor to guide or monitor treatment for MRSA infections.     Radiology Reports Dg Chest Port 1 View  02/11/2014   CLINICAL DATA:  Respiratory distress.  Smoker.  Hypertension.  EXAM: PORTABLE CHEST - 1 VIEW  COMPARISON:  None.  FINDINGS: The heart size and mediastinal contours are within normal limits. Both lungs are clear. Pulmonary hyperinflation noted and suspicious for COPD. No evidence of pleural effusion or pneumothorax. The visualized skeletal structures are unremarkable.  IMPRESSION: Pulmonary hyperinflation, suspicious for COPD.  No acute findings.   Electronically Signed   By: Earle Gell M.D.   On: 02/11/2014 19:34     CBC  Recent Labs Lab 02/11/14 1930 02/11/14 1939  WBC 9.9  --   HGB 13.7 15.6  HCT 40.8 46.0  PLT 181  --   MCV 99.0  --   MCH 33.3  --   MCHC 33.6  --   RDW 12.9  --   LYMPHSABS 1.0  --   MONOABS 0.6  --   EOSABS 0.1  --   BASOSABS 0.0  --     Chemistries   Recent Labs Lab 02/11/14 1930 02/11/14 1939  [name] 131* 132*  K 4.3 4.2  CL 97 98  CO2 23  --   GLUCOSE 221* 220*  BUN 7 9  CREATININE 0.98 0.90  CALCIUM 8.9  --   AST 33  --   ALT 24  --   ALKPHOS 52  --   BILITOT 0.5  --    ------------------------------------------------------------------------------------------------------------------ estimated creatinine clearance is 71.2 mL/min (by C-G formula based on Cr of  0.9). ------------------------------------------------------------------------------------------------------------------ No results for input(s): HGBA1C in the last 72 hours. ------------------------------------------------------------------------------------------------------------------ No results for input(s): CHOL, HDL, LDLCALC, TRIG, CHOLHDL, LDLDIRECT in the last 72 hours. ------------------------------------------------------------------------------------------------------------------  Recent Labs  02/11/14 2207  TSH 1.427   ------------------------------------------------------------------------------------------------------------------  No results for input(s): VITAMINB12, FOLATE, FERRITIN, TIBC, IRON, RETICCTPCT in the last 72 hours.  Coagulation profile No results for input(s): INR, PROTIME in the last 168 hours.  No results for input(s): DDIMER in the last 72 hours.  Cardiac Enzymes  Recent Labs Lab 02/11/14 2207 02/12/14 0357 02/12/14 0927  TROPONINI 0.93* 1.27* 2.17*   ------------------------------------------------------------------------------------------------------------------ Invalid input(s): POCBNP     Time Spent in minutes  35   Thiago Ragsdale K M.D on 02/12/2014 at 11:40 AM  Between 7am to 7pm - Pager - [phone]  After 7pm go to [email or web address] - [facility] Office  [phone]

## 2014-02-12 NOTE — Assessment & Plan Note (Signed)
Per [facility] - Dr. Candiss Norse

## 2014-02-12 NOTE — Progress Notes (Addendum)
ANTICOAGULATION CONSULT NOTE - Follow Up Consult  Pharmacy Consult for heparin Indication: chest pain/ACS  No Known Allergies  Patient Measurements: Height: 5\' 5"  (165.1 cm) Weight: 143 lb 1.3 oz (64.9 kg) IBW/kg (Calculated) : 61.5 Heparin Dosing Weight: 65 kg  Vital Signs: Temp: 97 F (36.1 C) (01/29 0400) Temp Source: Oral (01/29 0400) BP: 153/89 mmHg (01/29 0400) Pulse Rate: 97 (01/29 0400)  Labs:  Recent Labs  02/11/14 1930 02/11/14 1939 02/11/14 2207 02/12/14 0357 02/12/14 0650  HGB 13.7 15.6  --   --   --   HCT 40.8 46.0  --   --   --   PLT 181  --   --   --   --   HEPARINUNFRC  --   --   --   --  0.10*  CREATININE 0.98 0.90  --   --   --   TROPONINI 0.09*  --  0.93* 1.27*  --     Estimated Creatinine Clearance: 71.2 mL/min (by C-G formula based on Cr of 0.9).   Medications:  Infusions:  . albuterol 10 mg/hr (02/11/14 2051)  . heparin 750 Units/hr (02/12/14 0109)    Assessment: 64 yo male c/o respiratory distress, admitted for COPD exacerbation, with elevated and rising troponin, to begin heparin. Elevated troponin likely due to demand ischemia.   Heparin level is SUBtherpaeutic at 0.1 on 750 units/hr. No bleeding noted, CBC was normal last night. Spoke with RN and no problems with infusion.  Goal of Therapy:  Heparin level 0.3-0.7 units/ml Monitor platelets by anticoagulation protocol: Yes   Plan:  - Heparin 2000 units IV bolus then increase drip to 1000 units/hr - 6 hr heparin level and CBC - Daily heparin level and CBC - Monitor for s/sx of bleeding  [facility], [place].D., BCPS Clinical Pharmacist Pager: [phone] 02/12/2014 7:41 AM   Addendum:  Heparin level still comes back low this PM. Will adjust rate again.   Plan  Rebolus with 2000 units x1 Increase rate to 1200 units/hr F/u with 6 hr level  Onnie Boer, PharmD Pager: [phone] 02/12/2014 3:37 PM

## 2014-02-12 NOTE — Progress Notes (Signed)
Pt left floor for cath lab.

## 2014-02-12 NOTE — Assessment & Plan Note (Signed)
He does have lateral ST changes that are suggestive of Ischemia.   Definitely warrants ischemic evaluation - but depending upon clinical condition tomorrow - can determine if Myoview vs. Cath is best approach.

## 2014-02-12 NOTE — Assessment & Plan Note (Signed)
Probably related to underlying condition. Is already on high dose BB.  May consider (for short term) changing to Diltiazem from Amlodipine if HR continues to stay elevated once initial SIRS response dissipates.

## 2014-02-12 NOTE — CV Procedure (Addendum)
Left Heart Catheterization with Coronary Angiography Report  Caleb [name]  64 y.o.  male 22-Oct-1950  Procedure Date: 02/12/2014 Referring Physician: Glenetta Hew, M.D. Primary Cardiologist: Glenetta Hew, M.D.  INDICATIONS: Non-ST elevation MI  PROCEDURE: 1. Left heart catheterization; 2. Coronary angiography; 3. Left ventriculography  CONSENT:  The risks, benefits, and details of the procedure were explained in detail to the patient. Risks including death, stroke, heart attack, kidney injury, allergy, limb ischemia, bleeding and radiation injury were discussed.  The patient verbalized understanding and wanted to proceed.  Informed written consent was obtained.  PROCEDURE TECHNIQUE:  After Xylocaine anesthesia a 5 French Slender sheath was placed in the right radial artery with an angiocath and the modified Seldinger technique.  Coronary angiography was done using a 5 F JR4 and JL 3.5 cm catheter.  Left ventriculography was done using the JR 4 catheter and hand injection. A single dose of intracoronary nitroglycerin was given into the left main, 200 g. This was done prior to completion of left coronary images.  Images were reviewed.   CONTRAST:  Total of 75 cc.  COMPLICATIONS:  Brief hypoxemia with O2 saturations in the mid 70s a promptly improved into the low 90s with oxygen.  HEMODYNAMICS:  Aortic pressure 142/78 mmHg; LV pressure 145/22 mmHg; LVEDP 34 mmHg  ANGIOGRAPHIC DATA:   The left main coronary artery is calcified but widely patent..  The left anterior descending artery is heavily calcified in the proximal and mid segment. In the mid segment just beyond the origin of the first of perforator there is an eccentric high-grade stenosis that appears to represent a ruptured plaque. The first diagonal arises proximal to this stenosis and a large second diagonal arises distal to the stenosis. After the second diagonal there is segmental 70-80% stenosis in the LAD. This is also  within a heavily calcified segment. The LAD supplies collaterals to the PDA and distal right coronary..  The left circumflex artery is is severely diseased. After the small first obtuse marginal the circumflex is totally occluded and fills late by left to right collaterals. The late filling circumflex demonstrates a large second obtuse marginal and a smaller bifurcating third obtuse marginal.  The right coronary artery is heavily calcified and somewhat tortuous proximally. The distal artery contains an eccentric thrombus filled 95% stenosis. The PDA is subtotally occluded proximally. A moderate-sized left ventricular branch arises distally.[name]  PCI RESULTS: Not indicated  LEFT VENTRICULOGRAM:  Left ventricular angiogram was done in the 30 RAO projection and revealed global hypokinesis with ejection fraction of 25%.   IMPRESSIONS:  1. Severe three-vessel coronary disease with high-grade distal RCA, occlusion of the PDA, total occlusion of the proximal to mid circumflex, and high-grade obstruction in the proximal to mid LAD. The LAD supplies collaterals to the PDA and there are left to left collaterals to the circumflex.  2. Severe left ventricular systolic dysfunction with an EF of 25%, elevated LVEDP, and left ventricular enlargement. Findings are compatible with an ischemic cardiomyopathy.   RECOMMENDATION:  IV furosemide to establish diuresis.  Supplemental oxygen to keep O2 saturation greater than 90.  Transferred to the TCU/2H stepdown unit to be more closely observed.  The patient's coronary anatomy is surgical. He will need to have surgical consultation (not called as case completed at 7:10P) to determine if he is indeed a surgical candidate. Comorbidities may exclude surgery as a treatment. The anatomy is not ideally suited for PCI given heavy calcification, multifocal lesions, and total  occlusions of Cfx and PDA.[name]

## 2014-02-12 NOTE — Assessment & Plan Note (Signed)
With hypoxia to the point of metabolic (lactic) acidosis - I suspect that Demand ischemia is the most likely Dx.  He appears to have stabilized & plans are to d/c BiPAP. May consider gentle diuresis if CXR suggests pulm edema off of BiPap.

## 2014-02-12 NOTE — Progress Notes (Signed)
Inpatient Diabetes Program Recommendations  AACE/ADA: New Consensus Statement on Inpatient Glycemic Control (2013)  Target Ranges:  Prepandial:   less than 140 mg/dL      Peak postprandial:   less than 180 mg/dL (1-2 hours)      Critically ill patients:  140 - 180 mg/dL     Results for BASIM, BARTNIK (MRN [id number]) as of 02/12/2014 10:18  Ref. Range 02/11/2014 19:39  Glucose Latest Range: 70-99 mg/dL 220 (H)     Admitted with COPD.  History of HTN, Tobacco, Daily ETOH (6-7 cans beer daily).  No mention of DM in H&P.  Note A1c pending.  Patient currently receiving IV Solumedrol 60 mg Q6 hours.     MD- Please consider checking CBGs and cover with Novolog Sensitive SSI if elevated while patient on steroids     Will follow Wyn Quaker RN, MSN, CDE Diabetes Coordinator Inpatient Diabetes Program Team Pager: [phone] (8a-10p)

## 2014-02-12 NOTE — Progress Notes (Signed)
TR band removed at 2217. Gauze and tegaderm dressing applied. Site is a level 0.

## 2014-02-12 NOTE — H&P (View-Only) (Signed)
ANTICOAGULATION CONSULT NOTE - Follow Up Consult  Pharmacy Consult for heparin Indication: chest pain/ACS  No Known Allergies  Patient Measurements: Height: 5\' 5"  (165.1 cm) Weight: 143 lb 1.3 oz (64.9 kg) IBW/kg (Calculated) : 61.5 Heparin Dosing Weight: 65 kg  Vital Signs: Temp: 97 F (36.1 C) (01/29 0400) Temp Source: Oral (01/29 0400) BP: 153/89 mmHg (01/29 0400) Pulse Rate: 97 (01/29 0400)  Labs:  Recent Labs  02/11/14 1930 02/11/14 1939 02/11/14 2207 02/12/14 0357 02/12/14 0650  HGB 13.7 15.6  --   --   --   HCT 40.8 46.0  --   --   --   PLT 181  --   --   --   --   HEPARINUNFRC  --   --   --   --  0.10*  CREATININE 0.98 0.90  --   --   --   TROPONINI 0.09*  --  0.93* 1.27*  --     Estimated Creatinine Clearance: 71.2 mL/min (by C-G formula based on Cr of 0.9).   Medications:  Infusions:  . albuterol 10 mg/hr (02/11/14 2051)  . heparin 750 Units/hr (02/12/14 0109)    Assessment: 64 yo male c/o respiratory distress, admitted for COPD exacerbation, with elevated and rising troponin, to begin heparin. Elevated troponin likely due to demand ischemia.   Heparin level is SUBtherpaeutic at 0.1 on 750 units/hr. No bleeding noted, CBC was normal last night. Spoke with RN and no problems with infusion.  Goal of Therapy:  Heparin level 0.3-0.7 units/ml Monitor platelets by anticoagulation protocol: Yes   Plan:  - Heparin 2000 units IV bolus then increase drip to 1000 units/hr - 6 hr heparin level and CBC - Daily heparin level and CBC - Monitor for s/sx of bleeding  [facility], [place].D., BCPS Clinical Pharmacist Pager: [phone] 02/12/2014 7:41 AM   Addendum:  Heparin level still comes back low this PM. Will adjust rate again.   Plan  Rebolus with 2000 units x1 Increase rate to 1200 units/hr F/u with 6 hr level  Onnie Boer, PharmD Pager: [phone] 02/12/2014 3:37 PM

## 2014-02-12 NOTE — Assessment & Plan Note (Addendum)
No Sx that would be consistent with Angina - so ACS is less likely. However - elevated troponin to this level is a poor prognostic indicator -- cannot exclude underlying CAD (and concomitant NSTEMI).  Agree with Heparin x ~48 hr. Is on BB, ASA, & CCB + Statin. Would Check 2 D echo as this would help direct further diagnostic choices - Myoview ~Sunday vs. Cath Monday --> once stabilized from COPD/Pulm standpoint.

## 2014-02-12 NOTE — Interval H&P Note (Signed)
Cath Lab Visit (complete for each Cath Lab visit)  Clinical Evaluation Leading to the Procedure:   ACS: Yes.    Non-ACS:    Anginal Classification: CCS II  Anti-ischemic medical therapy: Maximal Therapy (2 or more classes of medications)  Non-Invasive Test Results: No non-invasive testing performed  Prior CABG: No previous CABG      History and Physical Interval Note:  02/12/2014 6:13 PM  Caleb [name]  has presented today for surgery, with the diagnosis of positive troponins  The various methods of treatment have been discussed with the patient and family. After consideration of risks, benefits and other options for treatment, the patient has consented to  Procedure(s): LEFT HEART CATHETERIZATION WITH CORONARY ANGIOGRAM (N/A) as a surgical intervention .  The patient's history has been reviewed, patient examined, no change in status, stable for surgery.  I have reviewed the patient's chart and labs.  Questions were answered to the patient's satisfaction.     Sinclair Grooms

## 2014-02-12 NOTE — Consult Note (Signed)
Referring Physician: Dr. Berle Mull Primary Cardiologist: None Reason for Consultation: elevated troponin   HPI: Caleb [name] is a 64 yo man with a significant smoking history, HTN and HLD who presented with SOB.  History is limited due to patient requiring BiPAP.  He has had progressive SOB over the last week and apparently at work today and became more SOB.  He arrived home after which time his wife called EMS.  Upon EMS arrival, he had wheezing in the field and was treated with DuoNeb with moderate improvement but was hypoxic even on CPAP with highest O2 sat 89%.  Upon arrival to ED, O2 sats on CPAP 81%, pt was lethargic and was then transitioned to BiPAP with improvement in sats to mid 90s.  CXR with hyperinflation but no infiltrates, LA notable at 3.05, BNP 825 and troponin 0.09.   He was admitted to step down unit by hospitalist but repeat troponin resulted at 0.9 prompting cardiology consultation.    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  He denies any chest pain now or prior to coming in.  He is resting comfortably on BiPAP.       Review of Systems:     Cardiac Review of Systems: {Y] = yes [ ]  = no  Chest Pain [    ]  Resting SOB [ y  ] Exertional SOB  [ y ]  Orthopnea [  ]   Pedal Edema [   ]    Palpitations [  ] Syncope  [  ]   Presyncope [   ]  General Review of Systems: [Y] = yes [  ]=no Constitional: recent weight change [  ]; anorexia [  ]; fatigue [  ]; nausea [  ]; night sweats [  ]; fever [  ]; or chills [  ];                                                                     Eyes : blurred vision [  ]; diplopia [   ]; vision changes [  ];  Amaurosis fugax[  ]; Resp: cough [  ];  wheezing[ y ];  hemoptysis[  ];  PND [  ];  GI:  gallstones[  ], vomiting[  ];  dysphagia[  ]; melena[  ];  hematochezia [  ]; heartburn[  ];   GU: kidney stones [  ]; hematuria[  ];   dysuria [  ];  nocturia[  ]; incontinence [  ];             Skin: rash,  swelling[  ];, hair loss[  ];  peripheral edema[  ];  or itching[  ]; Musculosketetal: myalgias[  ];  joint swelling[  ];  joint erythema[  ];  joint pain[  ];  back pain[  ];  Heme/Lymph: bruising[  ];  bleeding[  ];  anemia[  ];  Neuro: TIA[  ];  headaches[  ];  stroke[  ];  vertigo[  ];  seizures[  ];   paresthesias[  ];  difficulty walking[  ];  Psych:depression[  ]; anxiety[  ];  Endocrine: diabetes[  ];  thyroid dysfunction[  ];  Other:  Past  Medical History  Diagnosis Date  . Hypertension   . High cholesterol     Medications Prior to Admission  Medication Sig Dispense Refill  . amLODipine (NORVASC) 5 MG tablet Take 5 mg by mouth daily.    . carvedilol (COREG) 25 MG tablet Take 25 mg by mouth 2 (two) times daily with a meal.    . pravastatin (PRAVACHOL) 40 MG tablet Take 40 mg by mouth daily.    [name] spironolactone (ALDACTONE) 25 MG tablet Take 25 mg by mouth daily.       [name] amLODipine  5 mg Oral Daily  . aspirin EC  325 mg Oral Daily  . azithromycin  500 mg Intravenous Q24H  . carvedilol  25 mg Oral BID WC  . cefTRIAXone (ROCEPHIN)  IV  1 g Intravenous Q24H  . folic acid  1 mg Oral Daily  . guaiFENesin  600 mg Oral BID  . ipratropium-albuterol  3 mL Nebulization Q4H  . methylPREDNISolone (SOLU-MEDROL) injection  60 mg Intravenous Q6H  . multivitamin with minerals  1 tablet Oral Daily  . pravastatin  40 mg Oral Daily  . sodium chloride  3 mL Intravenous Q12H  . spironolactone  25 mg Oral Daily  . thiamine  100 mg Oral Daily   Or  . thiamine  100 mg Intravenous Daily    Infusions: . albuterol 10 mg/hr (02/11/14 2051)  . heparin 750 Units/hr (02/12/14 0109)    No Known Allergies  History   Social History  . Marital Status: Single    Spouse Name: N/A    Number of Children: N/A  . Years of Education: N/A   Occupational History  . Not on file.   Social History Main Topics  . Smoking status: Current Every Day Smoker    Types: Cigarettes  . Smokeless tobacco:  Not on file  . Alcohol Use: Yes     Comment: Six pack a day of beer  . Drug Use: Not on file  . Sexual Activity: Not on file   Other Topics Concern  . Not on file   Social History Narrative  . No narrative on file    History reviewed. No pertinent family history.  PHYSICAL EXAM: Filed Vitals:   02/12/14 0000  BP: 157/88  Pulse: 96  Temp:   Resp: 19     Intake/Output Summary (Last 24 hours) at 02/12/14 0132 Last data filed at 02/12/14 0000  Gross per 24 hour  Intake      0 ml  Output    350 ml  Net   -350 ml    General:  Older appearing gentleman, comfortable on BiPAP HEENT: normal Neck: supple. JVP elevated with +HJR. Carotids 2+ bilat; no bruits. No lymphadenopathy or thryomegaly appreciated. Cor: PMI nondisplaced. Tachy, regular. Normal S1, S2.  No rubs, gallops or murmurs. Lungs: Fair air movement, bibasilar crackles, expiratory wheezes much more notable with forced expiration, no rhonchi Abdomen: soft, nontender, nondistended. No hepatosplenomegaly. No bruits or masses. Good bowel sounds. Extremities: no cyanosis, clubbing, rash, edema Neuro: alert & oriented x 3, cranial nerves grossly intact. moves all 4 extremities w/o difficulty. Affect pleasant.  ECG: baseline wander, borderline sinus tach, rightward axis, nonspecific ST-T  Results for orders placed or performed during the hospital encounter of 02/11/14 (from the past 24 hour(s))  I-Stat arterial blood gas, ED     Status: Abnormal   Collection Time: 02/11/14  7:25 PM  Result Value Ref Range   pH, Arterial 7.325 (L)  7.350 - 7.450   pCO2 arterial 39.8 35.0 - 45.0 mmHg   pO2, Arterial 171.0 (H) 80.0 - 100.0 mmHg   Bicarbonate 20.9 20.0 - 24.0 mEq/L   TCO2 22 0 - 100 mmol/L   O2 Saturation 99.0 %   Acid-base deficit 5.0 (H) 0.0 - 2.0 mmol/L   Patient temperature 97.9 F    Collection site RADIAL, ALLEN'S TEST ACCEPTABLE    Drawn by RT    Sample type ARTERIAL   Comprehensive metabolic panel     Status:  Abnormal   Collection Time: 02/11/14  7:30 PM  Result Value Ref Range   Sodium 131 (L) 135 - 145 mmol/L   Potassium 4.3 3.5 - 5.1 mmol/L   Chloride 97 96 - 112 mmol/L   CO2 23 19 - 32 mmol/L   Glucose, Bld 221 (H) 70 - 99 mg/dL   BUN 7 6 - 23 mg/dL   Creatinine, Ser 0.98 0.50 - 1.35 mg/dL   Calcium 8.9 8.4 - 10.5 mg/dL   Total Protein 6.9 6.0 - 8.3 g/dL   Albumin 3.6 3.5 - 5.2 g/dL   AST 33 0 - 37 U/L   ALT 24 0 - 53 U/L   Alkaline Phosphatase 52 39 - 117 U/L   Total Bilirubin 0.5 0.3 - 1.2 mg/dL   GFR calc non Af Amer 84 (L) >90 mL/min   GFR calc Af Amer >90 >90 mL/min   Anion gap 11 5 - 15  Troponin I     Status: Abnormal   Collection Time: 02/11/14  7:30 PM  Result Value Ref Range   Troponin I 0.09 (H) <0.031 ng/mL  CBC with Differential     Status: Abnormal   Collection Time: 02/11/14  7:30 PM  Result Value Ref Range   WBC 9.9 4.0 - 10.5 K/uL   RBC 4.12 (L) 4.22 - 5.81 MIL/uL   Hemoglobin 13.7 13.0 - 17.0 g/dL   HCT 40.8 39.0 - 52.0 %   MCV 99.0 78.0 - 100.0 fL   MCH 33.3 26.0 - 34.0 pg   MCHC 33.6 30.0 - 36.0 g/dL   RDW 12.9 11.5 - 15.5 %   Platelets 181 150 - 400 K/uL   Neutrophils Relative % 83 (H) 43 - 77 %   Neutro Abs 8.2 (H) 1.7 - 7.7 K/uL   Lymphocytes Relative 10 (L) 12 - 46 %   Lymphs Abs 1.0 0.7 - 4.0 K/uL   Monocytes Relative 6 3 - 12 %   Monocytes Absolute 0.6 0.1 - 1.0 K/uL   Eosinophils Relative 1 0 - 5 %   Eosinophils Absolute 0.1 0.0 - 0.7 K/uL   Basophils Relative 0 0 - 1 %   Basophils Absolute 0.0 0.0 - 0.1 K/uL  I-stat troponin, ED     Status: None   Collection Time: 02/11/14  7:38 PM  Result Value Ref Range   Troponin i, poc 0.05 0.00 - 0.08 ng/mL   Comment 3          I-stat Chem 8, ED     Status: Abnormal   Collection Time: 02/11/14  7:39 PM  Result Value Ref Range   Sodium 132 (L) 135 - 145 mmol/L   Potassium 4.2 3.5 - 5.1 mmol/L   Chloride 98 96 - 112 mmol/L   BUN 9 6 - 23 mg/dL   Creatinine, Ser 0.90 0.50 - 1.35 mg/dL   Glucose,  Bld 220 (H) 70 - 99 mg/dL   Calcium, Ion 1.17  1.13 - 1.30 mmol/L   TCO2 20 0 - 100 mmol/L   Hemoglobin 15.6 13.0 - 17.0 g/dL   HCT 46.0 39.0 - 52.0 %  I-Stat CG4 Lactic Acid, ED     Status: Abnormal   Collection Time: 02/11/14  7:40 PM  Result Value Ref Range   Lactic Acid, Venous 3.05 (HH) 0.5 - 2.0 mmol/L   Comment NOTIFIED PHYSICIAN   Troponin I     Status: Abnormal   Collection Time: 02/11/14 10:07 PM  Result Value Ref Range   Troponin I 0.93 (HH) <0.031 ng/mL  Brain natriuretic peptide     Status: Abnormal   Collection Time: 02/11/14 10:07 PM  Result Value Ref Range   B Natriuretic Peptide 824.8 (H) 0.0 - 100.0 pg/mL  TSH     Status: None   Collection Time: 02/11/14 10:07 PM  Result Value Ref Range   TSH 1.427 0.350 - 4.500 uIU/mL  MRSA PCR Screening     Status: None   Collection Time: 02/11/14 10:41 PM  Result Value Ref Range   MRSA by PCR NEGATIVE NEGATIVE   Dg Chest Port 1 View  02/11/2014   CLINICAL DATA:  Respiratory distress.  Smoker.  Hypertension.  EXAM: PORTABLE CHEST - 1 VIEW  COMPARISON:  None.  FINDINGS: The heart size and mediastinal contours are within normal limits. Both lungs are clear. Pulmonary hyperinflation noted and suspicious for COPD. No evidence of pleural effusion or pneumothorax. The visualized skeletal structures are unremarkable.  IMPRESSION: Pulmonary hyperinflation, suspicious for COPD.  No acute findings.   Electronically Signed   By: Earle Gell M.D.   On: 02/11/2014 19:34     ASSESSMENT: 64 yo man found to have mildly elevated troponin in the setting of acute hypoxic respiratory failure and associated lactic acidosis.  It seems that the most likely etiology of his acute decompensation is underlying/undiagnosed COPD given presentation and clinical course in the ED.  However, I cannot exclude some component of CHF as he has elevated JVP with +HJR and a significantly elevated BNP.  His troponin is likely a result of demand ischemia in an older man  with multiple risk factor making the presence of CAD very likely with the above clinical scenario with hypoxia, lactic acidosis, tachycardia and hypertension.  ACS is unlikely in the above presentation and absence of ischemic symptoms.   PLAN/DISCUSSION: Recommend aggressive treatment of his underlying hypoxic respiratory failure likely due to COPD.  He does appear to have extra volume by JVP and BNP and would therefore recommend diuresis.  As for the demand ischemia, would recommend ASA, risk stratification with A1C and lipids, TSH, statin therapy and attempts to reduce demand by better BP and HR control.  Would recommend beta blocker but given respiratory status, could use CCB instead such as diltiazem.  Would obtain an echocardiogram once patient is more stable and HR slower for more accurate assessment. Further recommendations based on clinical course and above results.

## 2014-02-12 NOTE — Progress Notes (Signed)
Echocardiogram 2D Echocardiogram has been performed.  Caleb [name] 02/12/2014, 9:31 AM

## 2014-02-12 NOTE — Progress Notes (Signed)
Subjective/Objective Breathing much better - No CP. Denied PND/Orthopnea or edema PTA ~3 day of indolent worsening cough & dyspnea - No CP  Scheduled Meds: . amLODipine  5 mg Oral Daily  . antiseptic oral rinse  7 mL Mouth Rinse q12n4p  . aspirin EC  325 mg Oral Daily  . azithromycin  500 mg Intravenous Q24H  . carvedilol  25 mg Oral BID WC  . cefTRIAXone (ROCEPHIN)  IV  1 g Intravenous Q24H  . chlorhexidine  15 mL Mouth Rinse BID  . folic acid  1 mg Oral Daily  . guaiFENesin  600 mg Oral BID  . ipratropium-albuterol  3 mL Nebulization Q4H  . methylPREDNISolone (SOLU-MEDROL) injection  60 mg Intravenous Q6H  . multivitamin with minerals  1 tablet Oral Daily  . pravastatin  40 mg Oral Daily  . sodium chloride  3 mL Intravenous Q12H  . spironolactone  25 mg Oral Daily  . thiamine  100 mg Oral Daily   Or  . thiamine  100 mg Intravenous Daily   Continuous Infusions: . albuterol 10 mg/hr (02/11/14 2051)  . heparin 1,000 Units/hr (02/12/14 0805)   PRN Meds:acetaminophen **OR** acetaminophen, LORazepam **OR** LORazepam, ondansetron **OR** ondansetron (ZOFRAN) IV   Intake/Output Summary (Last 24 hours) at 02/12/14 0839 Last data filed at 02/12/14 0530  Gross per 24 hour  Intake    362 ml  Output    350 ml  Net     12 ml   Wt Readings from Last 3 Encounters:  02/12/14 143 lb 1.3 oz (64.9 kg)    Vital signs in last 24 hours: Temp:  [97 F (36.1 C)-97.7 F (36.5 C)] 97 F (36.1 C) (01/29 0827) Pulse Rate:  [87-107] 98 (01/29 0827) Resp:  [19-31] 24 (01/29 0827) BP: (138-159)/(73-91) 153/91 mmHg (01/29 0827) SpO2:  [88 %-100 %] 95 % (01/29 0827) FiO2 (%):  [40 %-100 %] 60 % (01/29 0400) Weight:  [140 lb (63.504 kg)-143 lb 1.3 oz (64.9 kg)] 143 lb 1.3 oz (64.9 kg) (01/29 0500)  Intake/Output last 3 shifts: I/O last 3 completed shifts: In: 362 [I.V.:62; IV Piggyback:300] Out: 350 [Urine:350] Intake/Output this shift:    PHYSICAL EXAM: General: Older appearing  gentleman, comfortable on BiPAP HEENT: normal Neck: supple. JVP mildly with +HJR. Carotids 2+ bilat; no bruits. No lymphadenopathy or thryomegaly appreciated. Cor: PMI nondisplaced. Tachy, regular. Normal S1, S2. No rubs, gallops or murmurs. Lungs: decreased air movement - but relatively clear on BiAPAP Abdomen: soft, nontender, nondistended. No hepatosplenomegaly. No bruits or masses. Good bowel sounds. Extremities: no cyanosis, clubbing, rash, edema Neuro: alert & oriented x 3, cranial nerves grossly intact. moves all 4 extremities w/o difficulty. Affect pleasant.    Problem Assessment/Plan Cardiovascular and Mediastinum Demand ischemia of myocardium Assessment & Plan No Sx that would be consistent with Angina - so ACS is less likely. However - elevated troponin to this level is a poor prognostic indicator -- cannot exclude underlying CAD (and concomitant NSTEMI).  Agree with Heparin x ~48 hr. Is on BB, ASA, & CCB + Statin. Would Check 2 D echo as this would help direct further diagnostic choices - Myoview ~Sunday vs. Cath Monday --> once stabilized from COPD/Pulm standpoint.  Respiratory * COPD with acute exacerbation Assessment & Plan Per [facility] - Dr. Candiss Norse  Acute respiratory failure with hypoxia Assessment & Plan With hypoxia to the point of metabolic (lactic) acidosis - I suspect that Demand ischemia is the most likely Dx.  He appears  to have stabilized & plans are to d/c BiPAP. May consider gentle diuresis if CXR suggests pulm edema off of BiPap.  Other Abnormal EKG Assessment & Plan He does have lateral ST changes that are suggestive of Ischemia.   Definitely warrants ischemic evaluation - but depending upon clinical condition tomorrow - can determine if Myoview vs. Cath is best approach.  Sinus tachycardia Assessment & Plan Probably related to underlying condition. Is already on high dose BB.  May consider (for short term) changing to Diltiazem from Amlodipine if HR  continues to stay elevated once initial SIRS response dissipates.    Leonie Man, M.D., M.S. Interventional Cardiologist   Pager # [phone]

## 2014-02-13 ENCOUNTER — Inpatient Hospital Stay (HOSPITAL_COMMUNITY): Payer: BLUE CROSS/BLUE SHIELD

## 2014-02-13 ENCOUNTER — Encounter (HOSPITAL_COMMUNITY): Payer: Self-pay | Admitting: Interventional Cardiology

## 2014-02-13 DIAGNOSIS — Z720 Tobacco use: Secondary | ICD-10-CM | POA: Diagnosis present

## 2014-02-13 DIAGNOSIS — I2510 Atherosclerotic heart disease of native coronary artery without angina pectoris: Secondary | ICD-10-CM

## 2014-02-13 DIAGNOSIS — I248 Other forms of acute ischemic heart disease: Secondary | ICD-10-CM

## 2014-02-13 DIAGNOSIS — I25118 Atherosclerotic heart disease of native coronary artery with other forms of angina pectoris: Secondary | ICD-10-CM

## 2014-02-13 DIAGNOSIS — E7800 Pure hypercholesterolemia, unspecified: Secondary | ICD-10-CM | POA: Diagnosis present

## 2014-02-13 DIAGNOSIS — I10 Essential (primary) hypertension: Secondary | ICD-10-CM | POA: Diagnosis present

## 2014-02-13 LAB — CBC
HCT: 42.1 % (ref 39.0–52.0)
Hemoglobin: 14.4 g/dL (ref 13.0–17.0)
MCH: 34.1 pg — ABNORMAL HIGH (ref 26.0–34.0)
MCHC: 34.2 g/dL (ref 30.0–36.0)
MCV: 99.8 fL (ref 78.0–100.0)
Platelets: 174 10*3/uL (ref 150–400)
RBC: 4.22 MIL/uL (ref 4.22–5.81)
RDW: 13.1 % (ref 11.5–15.5)
WBC: 12.5 10*3/uL — ABNORMAL HIGH (ref 4.0–10.5)

## 2014-02-13 LAB — URINALYSIS, ROUTINE W REFLEX MICROSCOPIC
Bilirubin Urine: NEGATIVE
Glucose, UA: NEGATIVE mg/dL
Hgb urine dipstick: NEGATIVE
Ketones, ur: NEGATIVE mg/dL
Leukocytes, UA: NEGATIVE
Nitrite: NEGATIVE
Protein, ur: NEGATIVE mg/dL
Specific Gravity, Urine: 1.025 (ref 1.005–1.030)
Urobilinogen, UA: 0.2 mg/dL (ref 0.0–1.0)
pH: 6.5 (ref 5.0–8.0)

## 2014-02-13 LAB — BASIC METABOLIC PANEL
Anion gap: 9 (ref 5–15)
BUN: 12 mg/dL (ref 6–23)
CO2: 25 mmol/L (ref 19–32)
Calcium: 8.8 mg/dL (ref 8.4–10.5)
Chloride: 96 mmol/L (ref 96–112)
Creatinine, Ser: 0.81 mg/dL (ref 0.50–1.35)
GFR calc Af Amer: >90 mL/min (ref >90)
GFR calc non Af Amer: >90 mL/min (ref >90)
Glucose, Bld: 145 mg/dL — ABNORMAL HIGH (ref 70–99)
Potassium: 3.7 mmol/L (ref 3.5–5.1)
Sodium: 130 mmol/L — ABNORMAL LOW (ref 135–145)

## 2014-02-13 LAB — HEMOGLOBIN A1C
Hgb A1c MFr Bld: 5.8 % — ABNORMAL HIGH (ref 4.8–5.6)
Mean Plasma Glucose: 120 mg/dL

## 2014-02-13 MED ORDER — LISINOPRIL 5 MG PO TABS
5.0000 mg | ORAL_TABLET | Freq: Every day | ORAL | Status: DC
  Administered 2014-02-13 – 2014-02-16 (×4): 5 mg via ORAL
  Filled 2014-02-13 (×5): qty 1

## 2014-02-13 MED ORDER — DEXTROSE 5 % IV SOLN
1.0000 g | Freq: Three times a day (TID) | INTRAVENOUS | Status: DC
  Administered 2014-02-13 – 2014-02-14 (×2): 1 g via INTRAVENOUS
  Filled 2014-02-13 (×5): qty 1

## 2014-02-13 MED ORDER — SODIUM CHLORIDE 0.9 % IV SOLN
INTRAVENOUS | Status: DC
  Administered 2014-02-13: via INTRAVENOUS

## 2014-02-13 MED ORDER — IOHEXOL 350 MG/ML SOLN
80.0000 mL | Freq: Once | INTRAVENOUS | Status: AC | PRN
  Administered 2014-02-13: 80 mL via INTRAVENOUS

## 2014-02-13 MED ORDER — IPRATROPIUM-ALBUTEROL 0.5-2.5 (3) MG/3ML IN SOLN: 3.0000 mL | RESPIRATORY_TRACT | Status: DC | PRN

## 2014-02-13 NOTE — Progress Notes (Signed)
Instructed patient on I/S and importance of using daily before surgery. Patient stated he understood and he is willing to use it 10 times per hour while he is awake. Demonstrated correct use technique.  Initial trail was up to 1500 and kept bulb between the arrows on the side of I/S. Will follow use with patient.

## 2014-02-13 NOTE — Progress Notes (Signed)
ANTIBIOTIC CONSULT NOTE - INITIAL  Pharmacy Consult for Cefepime Indication: acute bronchitis  No Known Allergies  Patient Measurements: Height: 5\' 5"  (165.1 cm) Weight: 141 lb 1.5 oz (64 kg) IBW/kg (Calculated) : 61.5  Vital Signs: Temp: 98.2 F (36.8 C) (01/30 1130) Temp Source: Oral (01/30 1130) BP: 130/56 mmHg (01/30 1337) Pulse Rate: 76 (01/30 1200) Intake/Output from previous day: 01/29 0701 - 01/30 0700 In: 313 [I.V.:63; IV Piggyback:250] Out: 950 [Urine:950] Intake/Output from this shift: Total I/O In: 1170 [P.O.:1100; I.V.:70] Out: [place]:  Recent Labs  02/11/14 1939 02/12/14 1448 02/12/14 2205 02/13/14 0405  WBC  --  11.0* 10.6* 12.5*  HGB 15.6 14.3 14.7 14.4  PLT  --  173 168 174  CREATININE 0.90  --  0.83 0.81   Estimated Creatinine Clearance: 79.1 mL/min (by C-G formula based on Cr of 0.81). No results for input(s): VANCOTROUGH, VANCOPEAK, VANCORANDOM, GENTTROUGH, GENTPEAK, GENTRANDOM, TOBRATROUGH, TOBRAPEAK, TOBRARND, AMIKACINPEAK, AMIKACINTROU, AMIKACIN in the last 72 hours.   Microbiology: Recent Results (from the past 720 hour(s))  MRSA PCR Screening     Status: None   Collection Time: 02/11/14 10:41 PM  Result Value Ref Range Status   MRSA by PCR NEGATIVE NEGATIVE Final    Comment:        The GeneXpert MRSA Assay (FDA approved for NASAL specimens only), is one component of a comprehensive MRSA colonization surveillance program. It is not intended to diagnose MRSA infection nor to guide or monitor treatment for MRSA infections.     Medical History: Past Medical History  Diagnosis Date  . Acute respiratory failure with hypoxia   . COPD (chronic obstructive pulmonary disease) 02/11/2014  . Acute systolic congestive heart failure 02/11/2014  . Tobacco abuse   . Essential hypertension   . Hypercholesteremia     Medications:  Prescriptions prior to admission  Medication Sig Dispense Refill Last Dose  . amLODipine  (NORVASC) 5 MG tablet Take 5 mg by mouth daily.   Past Week at Unknown time  . carvedilol (COREG) 25 MG tablet Take 25 mg by mouth 2 (two) times daily with a meal.   02/11/2014 at 0800  . pravastatin (PRAVACHOL) 40 MG tablet Take 40 mg by mouth daily.   Past Week at Unknown time  . spironolactone (ALDACTONE) 25 MG tablet Take 25 mg by mouth daily.   Past Week at Unknown time   Scheduled:  . antiseptic oral rinse  7 mL Mouth Rinse q12n4p  . aspirin EC  325 mg Oral Daily  . carvedilol  25 mg Oral BID WC  . chlorhexidine  15 mL Mouth Rinse BID  . enoxaparin (LOVENOX) injection  1 mg/kg Subcutaneous Q12H  . folic acid  1 mg Oral Daily  . guaiFENesin  600 mg Oral BID  . hydrALAZINE  25 mg Oral 3 times per day  . lisinopril  5 mg Oral Daily  . multivitamin with minerals  1 tablet Oral Daily  . spironolactone  25 mg Oral Daily  . thiamine  100 mg Oral Daily   Infusions:  . sodium chloride 10 mL/hr at 02/13/14 0000  . albuterol 10 mg/hr (02/11/14 2051)   Assessment: 64yo male presents with 2wk history of productive cough and SOB. Pharmacy is consulted to dose cefepime for acute bronchitis. Pt is afebrile, WBC 12.5, sCr 0.81 with CrCl ~ 80 mL/min.  Goal of Therapy:  Resolution of infection  Plan:  Cefepime 1g IV q8h Follow up culture results, renal function, and  clinical course  Andrey Cota. Diona Foley, PharmD Clinical Pharmacist Pager [phone] 02/13/2014,3:56 PM

## 2014-02-13 NOTE — Progress Notes (Signed)
Patient Demographics  Caleb [name], is a 64 y.o. male, DOB - 1950/05/16, ZHY:[id number]  Admit date - 02/11/2014   Admitting Physician Berle Mull, MD  Outpatient Primary MD for the patient is No primary care provider on file.  LOS - 2   Chief Complaint  Patient presents with  . Respiratory Distress        Subjective:   [name] today has, No headache, No chest pain, No abdominal pain - No Nausea, No new weakness tingling or numbness, No Cough -  Improved SOB.    Assessment & Plan    1. COPD without exacerbation - ABG not impressive, no wheezing on exam, stopped steroids and antibiotics.    2. NSTEMI Elevated troponin. This was the primary problem causing shortness of breath, cardiology on board, currently on full dose Lovenox along with aspirin, beta blocker and statin for secondary prevention. Left heart cath shows severe triple-vessel disease, cardiology will be consulting cardiothoracic surgery. He is currently pain-free.    3. Essential Hypertension - poor compliance with home medications, home medications resumed and added hydralazine for better control, monitor blood pressure.    4. Ongoing smoking and Alcohol abuse. Counseled to quit both. CIWA protocol.    5. 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 Compensated.    Code Status: Full  Family Communication: None present  Disposition Plan: Home   Procedures   TTE - There is akinesis in the basal and mid inferoseptal, inferior, inferolateral walls, all of the apical sepgments and in the true apex. This is suggestive of multivessel disease. Overall LVEF is  severely impaired estimated at 30-35%. There is abnormal relaxation and normal size left atrium, suggestive that LV dysfunction is fairly recent. RV size and function is normal.    Left heart cath 02-12-14 Dr Tamala Julian   IMPRESSIONS: 1. Severe three-vessel coronary disease with high-grade distal RCA, occlusion of the PDA, total occlusion of the proximal to mid circumflex, and high-grade obstruction in the proximal to mid LAD. The LAD supplies collaterals to the PDA and there are left to left collaterals to the circumflex.  2. Severe left ventricular systolic dysfunction with an EF of 25%, elevated LVEDP, and left ventricular enlargement. Findings are compatible with an ischemic cardiomyopathy.   RECOMMENDATION: IV furosemide to establish diuresis.  Supplemental oxygen to keep O2 saturation greater than 90.  Transferred to the TCU/2H stepdown unit to be more closely observed.  The patient's coronary anatomy is surgical. He will need to have surgical consultation (not called as case completed at 7:10P) to determine if he is indeed a surgical candidate. Comorbidities may exclude surgery as a treatment. The anatomy is not ideally suited for PCI given heavy calcification, multifocal lesions, and total occlusions of Cfx and PDA..    Consults Cardiology, [facility]   Medications  Scheduled Meds: . amLODipine  5 mg Oral Daily  . antiseptic oral rinse  7 mL Mouth Rinse q12n4p  . aspirin EC  325 mg Oral Daily  . carvedilol  25 mg Oral BID WC  . chlorhexidine  15 mL Mouth Rinse BID  . enoxaparin (LOVENOX) injection  1 mg/kg Subcutaneous Q12H  . folic acid  1 mg Oral Daily  . guaiFENesin  600 mg Oral BID  . hydrALAZINE  25 mg Oral 3 times per day  . multivitamin with minerals  1 tablet Oral Daily  . spironolactone  25 mg Oral Daily  . thiamine  100 mg Oral Daily   Or  . thiamine  100 mg Intravenous Daily   Continuous Infusions: . sodium chloride 10 mL/hr at 02/13/14 0000  . albuterol  10 mg/hr (02/11/14 2051)   PRN Meds:.acetaminophen, ipratropium-albuterol, LORazepam **OR** LORazepam, ondansetron (ZOFRAN) IV, oxyCODONE-acetaminophen  DVT Prophylaxis   Heparin   Gtt  Lab Results  Component Value Date   PLT 174 02/13/2014    Antibiotics     Anti-infectives    Start     Dose/Rate Route Frequency Ordered Stop   02/11/14 2130  cefTRIAXone (ROCEPHIN) 1 g in dextrose 5 % 50 mL IVPB  Status:  Discontinued     1 g100 mL/hr over 30 Minutes Intravenous Every 24 hours 02/11/14 2128 02/12/14 1139   02/11/14 2130  azithromycin (ZITHROMAX) 500 mg in dextrose 5 % 250 mL IVPB  Status:  Discontinued     500 mg250 mL/hr over 60 Minutes Intravenous Every 24 hours 02/11/14 2128 02/13/14 1048          Objective:   Filed Vitals:   02/13/14 0345 02/13/14 0400 02/13/14 0600 02/13/14 0800  BP: 139/58 147/77 156/72 153/75  Pulse: 72 74 83 92  Temp: 98.2 F (36.8 C)   98.1 F (36.7 C)  TempSrc: Oral   Oral  Resp: 16  16 17   Height:      Weight: 64 kg (141 lb 1.5 oz)     SpO2: 96%  96% 97%    Wt Readings from Last 3 Encounters:  02/13/14 64 kg (141 lb 1.5 oz)     Intake/Output Summary (Last 24 hours) at 02/13/14 1049 Last data filed at 02/13/14 1000  Gross per 24 hour  Intake    800 ml  Output   1225 ml  Net   -425 ml     Physical Exam  Awake Alert, Oriented X 3, No new F.N deficits, Normal affect [place].AT,PERRAL Supple Neck,No JVD, No cervical lymphadenopathy appriciated.  Symmetrical Chest wall movement, Good air movement bilaterally, CTAB RRR,No Gallops,Rubs or new Murmurs, No Parasternal Heave +ve B.Sounds, Abd Soft, No tenderness, No organomegaly appriciated, No rebound - guarding or rigidity. No Cyanosis, Clubbing or edema, No new Rash or bruise      Data Review   Micro Results Recent Results (from the past 240 hour(s))  MRSA PCR Screening     Status: None   Collection Time: 02/11/14 10:41 PM  Result Value Ref Range Status   MRSA by PCR NEGATIVE  NEGATIVE Final    Comment:        The GeneXpert MRSA Assay (FDA approved for NASAL specimens only), is one component of a comprehensive MRSA colonization surveillance program. It is not intended to diagnose MRSA infection nor to guide or monitor treatment for MRSA infections.     Radiology Reports Dg Chest Port 1 View  02/11/2014   CLINICAL DATA:  Respiratory distress.  Smoker.  Hypertension.  EXAM: PORTABLE CHEST - 1 VIEW  COMPARISON:  None.  FINDINGS: The heart size and mediastinal contours are within normal limits. Both lungs are clear. Pulmonary hyperinflation noted and suspicious for COPD. No evidence of pleural effusion or pneumothorax. The visualized skeletal structures are unremarkable.  IMPRESSION: Pulmonary hyperinflation, suspicious for COPD.  No acute findings.   Electronically Signed   By: Earle Gell M.D.   On: 02/11/2014 19:34     CBC  Recent Labs Lab 02/11/14 1930 02/11/14 1939 02/12/14 1448 02/12/14 2205 02/13/14 0405  WBC 9.9  --  11.0* 10.6* 12.5*  HGB 13.7 15.6 14.3 14.7 14.4  HCT 40.8 46.0 41.3 42.0 42.1  PLT 181  --  173 168 174  MCV 99.0  --  96.5 98.6 99.8  MCH 33.3  --  33.4 34.5* 34.1*  MCHC 33.6  --  34.6 35.0 34.2  RDW 12.9  --  12.9 12.9 13.1  LYMPHSABS 1.0  --   --   --   --   MONOABS 0.6  --   --   --   --   EOSABS 0.1  --   --   --   --   BASOSABS 0.0  --   --   --   --     Chemistries   Recent Labs Lab 02/11/14 1930 02/11/14 1939 02/12/14 2205 02/13/14 0405  NA 131* 132*  --  130*  K 4.3 4.2  --  3.7  CL 97 98  --  96  CO2 23  --   --  25  GLUCOSE 221* 220*  --  145*  BUN 7 9  --  12  CREATININE 0.98 0.90 0.83 0.81  CALCIUM 8.9  --   --  8.8  AST 33  --   --   --   ALT 24  --   --   --   ALKPHOS 52  --   --   --   BILITOT 0.5  --   --   --    ------------------------------------------------------------------------------------------------------------------ estimated creatinine clearance is 79.1 mL/min (by C-G formula  based on Cr of 0.81). ------------------------------------------------------------------------------------------------------------------  Recent Labs  02/11/14 2207  HGBA1C 5.8*   ------------------------------------------------------------------------------------------------------------------ No results for input(s): CHOL, HDL, LDLCALC, TRIG, CHOLHDL, LDLDIRECT in the last 72 hours. ------------------------------------------------------------------------------------------------------------------  Recent Labs  02/11/14 2207  TSH 1.427   ------------------------------------------------------------------------------------------------------------------ No results for input(s): VITAMINB12, FOLATE, FERRITIN, TIBC, IRON, RETICCTPCT in the last 72 hours.  Coagulation profile No results for input(s): INR, PROTIME in the last 168 hours.  No results for input(s): DDIMER in the last 72 hours.  Cardiac Enzymes  Recent Labs Lab 02/11/14 2207 02/12/14 0357 02/12/14 0927  TROPONINI 0.93* 1.27* 2.17*   ------------------------------------------------------------------------------------------------------------------ Invalid input(s): POCBNP     Time Spent in minutes  35   Quandarius Nill K M.D on 02/13/2014 at 10:49 AM  Between 7am to 7pm - Pager - [phone]  After 7pm go to [email or web address] - [facility] Office  [phone]

## 2014-02-13 NOTE — Consult Note (Addendum)
[address]       ,[place] [address]             [phone]          CARDIOTHORACIC SURGERY CONSULTATION REPORT  PCP is [name], MD Referring Provider is Sinclair Grooms, MD   Reason for consultation:  Severe 3-vessel CAD  HPI:  Patient is a 64 year old white male with no previous cardiac history but risk factors notable for history of hypertension, hypercholesterolemia, and long-standing tobacco abuse. Patient states that he first began to experience increased productive cough and shortness of breath approximately 2 weeks ago. Symptoms waxed and waned in severity until he developed somewhat acute onset of severe resting shortness of breath which prompted presentation to the emergency department 02/11/2014. On arrival of patient had diffuse wheezing in both lung fields and resting hypoxemia with oxygen saturation 89% on oxygen therapy with CPAP. Baseline electrocardiogram's were notable for sinus tachycardia with nonspecific ST and T wave changes, and portable chest x-ray revealed hyperinflation consistent with COPD.   He was has been treated for presumed acute flare of COPD with symptomatic improvement.  Initial set of cardiac enzymes were borderline positive, but the patient has subsequently ruled in for an acute non-ST segment elevation myocardial infarction with Troponin I measured 2.17.  BNP level was elevated 824.8.  The patient was subsequently seen in consultation by cardiology and transthoracic echocardiogram revealed severe left ventricular systolic dysfunction with ejection fraction estimated 30-35%. The patient underwent left heart catheterization by Dr. Tamala Julian which revealed severe three-vessel coronary artery disease and ischemic cardiomyopathy with ejection fraction estimated 25%. Left ventricular end-diastolic pressure was elevated to 34 mmHg. Right heart catheterization was not performed. Elective cardiothoracic surgical consultation was  requested.  The patient is divorced but lives with his ex-wife in [place]. He has 2 grown daughters. 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 He has a long-standing history of heavy tobacco abuse, typically smoking in excess of 1 pack of cigarettes daily. He drinks approximately one sixpack of beer daily. He lives a sedentary lifestyle.  He denies any history of chest pain or chest tightness either with activity or rest. He has not had any chest discomfort during his recent acute illness. He denies any problems with shortness of breath, although he admits that he gets short of breath with physical activity such as walking up a flight of stairs.  He states that his breathing got progressively worse over the past 2 weeks in association with a productive cough including intermittent yellow sputum production. He has not had associated fevers or chills. He denies any history of PND, orthopnea, palpitations, dizzy spells, syncope, or lower extremity edema.  Past Medical History  Diagnosis Date  . Acute respiratory failure with hypoxia   . COPD (chronic obstructive pulmonary disease) 02/11/2014  . Acute systolic congestive heart failure 02/11/2014  . Tobacco abuse   . Essential hypertension   . Hypercholesteremia     Past Surgical History  Procedure Laterality Date  . Left heart catheterization with coronary angiogram N/A 02/12/2014    Procedure: LEFT HEART CATHETERIZATION WITH CORONARY ANGIOGRAM;  Surgeon: Sinclair Grooms, MD;  Location: [facility] CATH LAB;  Service: Cardiovascular;  Laterality: N/A;    History reviewed. No pertinent family history.  History   Social History  . Marital Status: Single    Spouse Name: N/A    Number of Children: N/A  . Years of Education: N/A  Occupational History  . Not on file.   Social History Main Topics  . Smoking status: Current Every Day Smoker    Types: Cigarettes  . Smokeless tobacco: Not on file  . Alcohol Use: Yes     Comment: Six pack a day of  beer  . Drug Use: Not on file  . Sexual Activity: Not on file   Other Topics Concern  . Not on file   Social History Narrative    Prior to Admission medications   Medication Sig Start Date End Date Taking? Authorizing Provider  amLODipine (NORVASC) 5 MG tablet Take 5 mg by mouth daily.   Yes Historical Provider, MD  carvedilol (COREG) 25 MG tablet Take 25 mg by mouth 2 (two) times daily with a meal.   Yes Historical Provider, MD  pravastatin (PRAVACHOL) 40 MG tablet Take 40 mg by mouth daily.   Yes Historical Provider, MD  spironolactone (ALDACTONE) 25 MG tablet Take 25 mg by mouth daily.   Yes Historical Provider, MD    Current Facility-Administered Medications  Medication Dose Route Frequency Provider Last Rate Last Dose  . 0.9 %  sodium chloride infusion   Intravenous Continuous Thurnell Lose, MD 10 mL/hr at 02/13/14 0000    . acetaminophen (TYLENOL) tablet 650 mg  650 mg Oral Q4H PRN Belva Crome III, MD      . albuterol (PROVENTIL,VENTOLIN) solution continuous neb  10 mg/hr Nebulization Continuous Katheren Shams, MD 2 mL/hr at 02/11/14 2051 10 mg/hr at 02/11/14 2051  . antiseptic oral rinse (CPC / CETYLPYRIDINIUM CHLORIDE 0.05%) solution 7 mL  7 mL Mouth Rinse q12n4p Berle Mull, MD   7 mL at 02/13/14 1221  . aspirin EC tablet 325 mg  325 mg Oral Daily Berle Mull, MD   325 mg at 02/13/14 1006  . carvedilol (COREG) tablet 25 mg  25 mg Oral BID WC Berle Mull, MD   25 mg at 02/13/14 0836  . chlorhexidine (PERIDEX) 0.12 % solution 15 mL  15 mL Mouth Rinse BID Berle Mull, MD   15 mL at 02/13/14 0842  . enoxaparin (LOVENOX) injection 65 mg  1 mg/kg Subcutaneous Q12H Belva Crome III, MD   65 mg at 02/13/14 0836  . folic acid (FOLVITE) tablet 1 mg  1 mg Oral Daily Berle Mull, MD   1 mg at 02/13/14 1007  . guaiFENesin (MUCINEX) 12 hr tablet 600 mg  600 mg Oral BID Berle Mull, MD   600 mg at 02/13/14 1007  . hydrALAZINE (APRESOLINE) tablet 25 mg  25 mg Oral 3 times per day  Thurnell Lose, MD   25 mg at 02/13/14 1337  . ipratropium-albuterol (DUONEB) 0.5-2.5 (3) MG/3ML nebulizer solution 3 mL  3 mL Nebulization Q4H PRN John P Day, RRT      . lisinopril (PRINIVIL,ZESTRIL) tablet 5 mg  5 mg Oral Daily Thurnell Lose, MD   5 mg at 02/13/14 1219  . LORazepam (ATIVAN) tablet 1 mg  1 mg Oral Q6H PRN Berle Mull, MD       Or  . LORazepam (ATIVAN) injection 1 mg  1 mg Intravenous Q6H PRN Berle Mull, MD      . multivitamin with minerals tablet 1 tablet  1 tablet Oral Daily Berle Mull, MD   1 tablet at 02/13/14 1007  . ondansetron (ZOFRAN) injection 4 mg  4 mg Intravenous Q6H PRN Sinclair Grooms, MD      . oxyCODONE-acetaminophen (PERCOCET/ROXICET) 5-325 MG  per tablet 1-2 tablet  1-2 tablet Oral Q4H PRN Belva Crome III, MD      . spironolactone (ALDACTONE) tablet 25 mg  25 mg Oral Daily Berle Mull, MD   25 mg at 02/13/14 1006  . thiamine (VITAMIN B-1) tablet 100 mg  100 mg Oral Daily Berle Mull, MD   100 mg at 02/13/14 1007    No Known Allergies    Review of Systems:   General:  normal appetite, normal energy, no weight gain, no weight loss, no fever  Cardiac:  no chest pain with exertion, no chest pain at rest, +SOB with exertion, + resting SOB at the time of admission, no PND, no orthopnea, no palpitations, no arrhythmia, no atrial fibrillation, no LE edema, no dizzy spells, no syncope  Respiratory:  + shortness of breath, no home oxygen, + productive cough, no dry cough, no bronchitis, + wheezing, no hemoptysis, no asthma, no pain with inspiration or cough, no sleep apnea, no CPAP at night  GI:   no difficulty swallowing, no reflux, no frequent heartburn, no hiatal hernia, no abdominal pain, no constipation, no diarrhea, no hematochezia, no hematemesis, no melena  GU:   no dysuria,  no frequency, no urinary tract infection, no hematuria, no enlarged prostate, no kidney stones, no kidney disease  Vascular:  no pain suggestive of claudication, + pain and  numbness in feet, no leg cramps, no varicose veins, no DVT, no non-healing foot ulcer  Neuro:   no stroke, no TIA's, no seizures, no headaches, no temporary blindness one eye,  no slurred speech, + reported h/o peripheral neuropathy, no chronic pain, no instability of gait, no memory/cognitive dysfunction  Musculoskeletal: no arthritis, no joint swelling, no myalgias, no difficulty walking, normal mobility   Skin:   no rash, no itching, no skin infections, no pressure sores or ulcerations  Psych:   no anxiety, no depression, no nervousness, no unusual recent stress  Eyes:   no blurry vision, no floaters, no recent vision changes, + wears glasses   ENT:   no hearing loss, edentulous with no remaining teeth, + dentures, last saw dentist many years ago  Hematologic:  no easy bruising, no abnormal bleeding, no clotting disorder, no frequent epistaxis  Endocrine:  no diabetes, does not check CBG's at home     Physical Exam:   BP 130/56 mmHg  Pulse 76  Temp(Src) 98.2 F (36.8 C) (Oral)  Resp 19  Ht 5\' 5"  (1.651 m)  Wt 64 kg (141 lb 1.5 oz)  BMI 23.48 kg/m2  SpO2 97%  General:  Well developed male appears older than stated age in NAD  HEENT:  Unremarkable   Neck:   no JVD, no bruits, no adenopathy   Chest:   Scattered rhonchi both lung fields, symmetrical breath sounds, no wheezes, + inspiratory crackles  CV:   RRR, no murmur   Abdomen:  soft, non-tender, no masses   Extremities:  warm, well-perfused, pulses diminished, no lower extremity edema  Rectal/GU  Deferred  Neuro:   Grossly non-focal and symmetrical throughout  Skin:   Clean and dry, no rashes, no breakdown  Diagnostic Tests:  Transthoracic Echocardiography  Patient:  Jrue, Jarriel MR #:    [id number] Study Date: 02/12/2014 Gender:   M Age:    63 Height:   165.1 cm Weight:   64.9 kg BSA:    1.73 m^2 Pt. Status: Room:    [place] Tresa Res, RDCS ATTENDING  Lala Lund  K ADMITTING  Berle Mull D ORDERING   Patel, Pranav D REFERRING  Berle Mull D PERFORMING  [facility], Inpatient  cc:  ------------------------------------------------------------------- LV EF: 30% -  35%  ------------------------------------------------------------------- Indications:   Chest pain 786.51.  ------------------------------------------------------------------- History:  PMH:  Chronic obstructive pulmonary disease. Risk factors: Current tobacco use.  ------------------------------------------------------------------- Study Conclusions  - Left ventricle: The cavity size was normal. There was mild concentric hypertrophy. Systolic function was moderately to severely reduced. The estimated ejection fraction was in the range of 30% to 35%. Wall motion was normal; there were no regional wall motion abnormalities. Doppler parameters are consistent with abnormal left ventricular relaxation (grade 1 diastolic dysfunction). - Aortic valve: Trileaflet; normal thickness leaflets. There was no stenosis. There was no significant regurgitation. - Aortic root: The aortic root was normal in size. - Mitral valve: Structurally normal valve. Transvalvular velocity was within the normal range. There was no evidence for stenosis. There was mild regurgitation. - Left atrium: The atrium was normal in size. - Right ventricle: Systolic function was normal. - Right atrium: The atrium was normal in size. - Pulmonary arteries: Systolic pressure was within the normal range. PA peak pressure: 38 mm Hg (S). - Pericardium, extracardiac: There was no pericardial effusion.  Impressions:  - There is akinesis in the basal and mid inferoseptal, inferior, inferolateral walls, all of the apical sepgments and in the true apex. This is suggestive of multivessel disease. Overall LVEF is severely impaired estimated at 30-35%. There is abnormal  relaxation and normal size left atrium, suggestive that LV dysfunction is fairly recent. RV size and function is normal.  Transthoracic echocardiography. M-mode, complete 2D, spectral Doppler, and color Doppler. Birthdate: Patient birthdate: 01/24/1950. Age: Patient is 64 yr old. Sex: Gender: male. BMI: 23.8 kg/m^2. Blood pressure:   153/91 Patient status: Inpatient. Study date: Study date: 02/12/2014. Study time: 09:00 AM. Location: ICU/CCU  -------------------------------------------------------------------  ------------------------------------------------------------------- Left ventricle: The cavity size was normal. There was mild concentric hypertrophy. Systolic function was moderately to severely reduced. The estimated ejection fraction was in the range of 30% to 35%. Wall motion was normal; there were no regional wall motion abnormalities. Doppler parameters are consistent with abnormal left ventricular relaxation (grade 1 diastolic dysfunction).  ------------------------------------------------------------------- Aortic valve:  Trileaflet; normal thickness leaflets. Mobility was not restricted. Doppler:  There was no stenosis.  There was no significant regurgitation.  ------------------------------------------------------------------- Aorta: Aortic root: The aortic root was normal in size.  ------------------------------------------------------------------- Mitral valve:  Structurally normal valve.  Mobility was not restricted. Doppler: Transvalvular velocity was within the normal range. There was no evidence for stenosis. There was mild regurgitation.  ------------------------------------------------------------------- Left atrium: The atrium was normal in size.  ------------------------------------------------------------------- Right ventricle: The cavity size was normal. Wall thickness was normal. Systolic function was  normal.  ------------------------------------------------------------------- Pulmonic valve:  Structurally normal valve.  Cusp separation was normal. Doppler: Transvalvular velocity was within the normal range. There was no evidence for stenosis. There was no regurgitation.  ------------------------------------------------------------------- Tricuspid valve:  Structurally normal valve.  Doppler: Transvalvular velocity was within the normal range. There was trivial regurgitation.  ------------------------------------------------------------------- Pulmonary artery:  The main pulmonary artery was normal-sized. Systolic pressure was within the normal range.  ------------------------------------------------------------------- Right atrium: The atrium was normal in size.  ------------------------------------------------------------------- Pericardium: There was no pericardial effusion.  ------------------------------------------------------------------- Systemic veins: Inferior vena cava: The vessel was normal in size.  ------------------------------------------------------------------- Measurements  Left ventricle              Value  Reference LV ID, ED, PLAX chordal         48.8 mm   43 - 52 LV ID, ES, PLAX chordal     (H)   42.9 mm   23 - 38 LV fx shortening, PLAX chordal  (L)   12  %   >=29 LV PW thickness, ED           11.2 mm   --------- IVS/LV PW ratio, ED           1.17     <=1.3 LV ejection fraction, 1-p A4C      39  %   --------- LV end-diastolic volume, 2-p       114  ml   --------- LV end-systolic volume, 2-p       74  ml   --------- LV ejection fraction, 2-p        36  %   --------- Stroke volume, 2-p            41  ml   --------- LV end-diastolic volume/bsa, 2-p     66  ml/m^2 --------- LV end-systolic  volume/bsa, 2-p     42  ml/m^2 --------- Stroke volume/bsa, 2-p          23.4 ml/m^2 --------- LV e&', lateral              6.2  cm/s  --------- LV E/e&', lateral             9.23     --------- LV e&', medial              4.79 cm/s  --------- LV E/e&', medial             11.94    --------- LV e&', average              5.5  cm/s  --------- LV E/e&', average             10.41    ---------  Ventricular septum            Value    Reference IVS thickness, ED            13.1 mm   ---------  LVOT                   Value    Reference LVOT ID, S                18  mm   --------- LVOT area                2.54 cm^2  ---------  Aorta                  Value    Reference Aortic root ID, ED            32  mm   ---------  Left atrium               Value    Reference LA ID, A-P, ES              38  mm   --------- LA ID/bsa, A-P              2.19 cm/m^2 <=2.2 LA volume, S               54.9 ml   --------- LA volume/bsa, S             31.7 ml/m^2 --------- LA  volume, ES, 1-p A4C          46.5 ml   --------- LA volume/bsa, ES, 1-p A4C        26.8 ml/m^2 --------- LA volume, ES, 1-p A2C          59.8 ml   --------- LA volume/bsa, ES, 1-p A2C        34.5 ml/m^2 ---------  Mitral valve               Value    Reference Mitral E-wave peak velocity       57.2 cm/s  --------- Mitral A-wave peak velocity       83.9 cm/s  --------- Mitral deceleration time         169  ms   150 - 230 Mitral E/A ratio, peak          0.7     ---------  Pulmonary arteries             Value    Reference PA pressure, S, DP        (H)   38  mm Hg <=30  Tricuspid valve             Value    Reference Tricuspid regurg peak velocity      296  cm/s  --------- Tricuspid peak RV-RA gradient      35  mm Hg ---------  Systemic veins              Value    Reference Estimated CVP              3   mm Hg ---------  Right ventricle             Value    Reference RV pressure, S, DP        (H)   38  mm Hg <=30 RV s&', lateral, S            11.3 cm/s  ---------  Legend: (L) and (H) mark values outside specified reference range.  ------------------------------------------------------------------- Prepared and Electronically Authenticated by  Ena Dawley, M.D. 2016-01-29T11:51:38     Left Heart Catheterization with Coronary Angiography Report  FREDI GEILER  64 y.o.  male 04/06/1950  Procedure Date: 02/12/2014 Referring Physician: Glenetta Hew, M.D. Primary Cardiologist: Glenetta Hew, M.D.  INDICATIONS: Non-ST elevation MI  PROCEDURE: 1. Left heart catheterization; 2. Coronary angiography; 3. Left ventriculography  CONSENT:  The risks, benefits, and details of the procedure were explained in detail to the patient. Risks including death, stroke, heart attack, kidney injury, allergy, limb ischemia, bleeding and radiation injury were discussed. The patient verbalized understanding and wanted to proceed. Informed written consent was obtained.  PROCEDURE TECHNIQUE: After Xylocaine anesthesia a 5 French Slender sheath was placed in the right radial artery with an angiocath and the modified Seldinger technique. Coronary angiography was done using a 5 F JR4 and JL 3.5 cm catheter. Left ventriculography was done using the JR 4 catheter and hand injection. A single dose of intracoronary nitroglycerin was given into the left  main, 200 g. This was done prior to completion of left coronary images.  Images were reviewed.  CONTRAST: Total of 75 cc.  COMPLICATIONS: Brief hypoxemia with O2 saturations in the mid 70s a promptly improved into the low 90s with oxygen.  HEMODYNAMICS: Aortic pressure 142/78 mmHg; LV pressure 145/22 mmHg; LVEDP 34 mmHg  ANGIOGRAPHIC DATA: The left main coronary artery is calcified but widely patent..  The  left anterior descending artery is heavily calcified in the proximal and mid segment. In the mid segment just beyond the origin of the first of perforator there is an eccentric high-grade stenosis that appears to represent a ruptured plaque. The first diagonal arises proximal to this stenosis and a large second diagonal arises distal to the stenosis. After the second diagonal there is segmental 70-80% stenosis in the LAD. This is also within a heavily calcified segment. The LAD supplies collaterals to the PDA and distal right coronary..  The left circumflex artery is is severely diseased. After the small first obtuse marginal the circumflex is totally occluded and fills late by left to right collaterals. The late filling circumflex demonstrates a large second obtuse marginal and a smaller bifurcating third obtuse marginal.  The right coronary artery is heavily calcified and somewhat tortuous proximally. The distal artery contains an eccentric thrombus filled 95% stenosis. The PDA is subtotally occluded proximally. A moderate-sized left ventricular branch arises distally.[name]  PCI RESULTS: Not indicated  LEFT VENTRICULOGRAM: Left ventricular angiogram was done in the 30 RAO projection and revealed global hypokinesis with ejection fraction of 25%.   IMPRESSIONS: 1. Severe three-vessel coronary disease with high-grade distal RCA, occlusion of the PDA, total occlusion of the proximal to mid circumflex, and high-grade obstruction in the proximal to mid LAD. The LAD supplies collaterals  to the PDA and there are left to left collaterals to the circumflex.  2. Severe left ventricular systolic dysfunction with an EF of 25%, elevated LVEDP, and left ventricular enlargement. Findings are compatible with an ischemic cardiomyopathy.   RECOMMENDATION: IV furosemide to establish diuresis.  Supplemental oxygen to keep O2 saturation greater than 90.  Transferred to the TCU/2H stepdown unit to be more closely observed.  The patient's coronary anatomy is surgical. He will need to have surgical consultation (not called as case completed at 7:10P) to determine if he is indeed a surgical candidate. Comorbidities may exclude surgery as a treatment. The anatomy is not ideally suited for PCI given heavy calcification, multifocal lesions, and total occlusions of Cfx and PDA..   Impression:  I have personally reviewed the patient's transthoracic echocardiogram and diagnostic cardiac catheterization films. The patient has severe three-vessel coronary artery disease with likely chronic ischemic cardiomyopathy and recent acute myocardial infarction complicated by acute systolic congestive heart failure. The patient also has likely severe COPD with long-standing tobacco abuse and probable acute flare of COPD with bronchitis.  I agree the patient would likely benefit from coronary artery bypass grafting at some point, but the question will be one of timing and associated risks of respiratory failure.    Plan:  I have discussed the findings from the patient's echocardiogram and diagnostic cardiac catheterization at length with the patient and his family in his hospital room this afternoon. We discussed the indications, risks, and potential benefits of coronary artery bypass grafting.  Alternative treatment strategies have been discussed including long term medical therapy and PCI.  The impact of the patient's underlying chronic lung disease and long-standing tobacco abuse was discussed.  The importance  of long term risk modification have been emphasized.  All questions answered.  I agree with plans to continue medical treatment for COPD flare with bronchodilators and antibiotics as well as diuretic therapy for congestive heart failure. Will get CTA chest to r/o PE (doubtful) and better characterize the severity of COPD.  We will obtain formal pulmonary function tests early this week.  The patient should be seen in consultation by  Pulmonary Medicine to assist with risk stratification, medical tune-up and long-term follow up.  Depending upon clinical progress the patient may need to be evaluated by the advanced heart failure team.  We will continue to follow along over the next few days and establish a definitive plan for management depending upon subsequent diagnostic testing and the patient's clinical progress.   I spent in excess of 120 minutes during the conduct of this hospital consultation and >50% of this time involved direct face-to-face encounter for counseling and/or coordination of the patient's care.   Valentina Gu. [name], MD 02/13/2014 3:17 PM

## 2014-02-13 NOTE — Progress Notes (Signed)
PROGRESS NOTE  Subjective:   Sergio is a 64 yo with hx of COPD and CAD. Was admitted with unstable angina.  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 Denies any pain at this time   Objective:    Vital Signs:   Temp:  [98.1 F (36.7 C)-98.2 F (36.8 C)] 98.1 F (36.7 C) (01/30 0800) Pulse Rate:  [72-102] 92 (01/30 0800) Resp:  [16-24] 17 (01/30 0800) BP: (105-163)/(58-89) 153/75 mmHg (01/30 0800) SpO2:  [91 %-97 %] 97 % (01/30 0800) FiO2 (%):  [28 %-40 %] 28 % (01/29 1538) Weight:  [141 lb 1.5 oz (64 kg)] 141 lb 1.5 oz (64 kg) (01/30 0345)  Last BM Date: 02/12/14   24-hour weight change: Weight change: 1 lb 1.5 oz (0.496 kg)  Weight trends: Filed Weights   02/11/14 2300 02/12/14 0500 02/13/14 0345  Weight: 141 lb 1.5 oz (64 kg) 143 lb 1.3 oz (64.9 kg) 141 lb 1.5 oz (64 kg)    Intake/Output:  01/29 0701 - 01/30 0700 In: 313 [I.V.:63; IV Piggyback:250] Out: 950 [Urine:950] Total I/O In: 490 [P.O.:460; I.V.:30] Out: 275 [Urine:275]   Physical Exam: BP 153/75 mmHg  Pulse 92  Temp(Src) 98.1 F (36.7 C) (Oral)  Resp 17  Ht 5\' 5"  (1.651 m)  Wt 141 lb 1.5 oz (64 kg)  BMI 23.48 kg/m2  SpO2 97%  Wt Readings from Last 3 Encounters:  02/13/14 141 lb 1.5 oz (64 kg)    General: Vital signs reviewed and noted.   Head: Normocephalic, atraumatic.  Eyes: conjunctivae/corneas clear.  EOM's intact.   Throat: normal  Neck:  normal   Lungs:    basilar rales   Heart:   RR, normal S1S2   Abdomen:  Soft, non-tender, non-distended    Extremities: Right radial cath site look ok    Neurologic: A&O X3, CN II - XII are grossly intact.   Psych: Normal     Labs: BMET:  Recent Labs  02/11/14 1930 02/11/14 1939 02/12/14 2205 02/13/14 0405  NA 131* 132*  --  130*  K 4.3 4.2  --  3.7  CL 97 98  --  96  CO2 23  --   --  25  GLUCOSE 221* 220*  --  145*  BUN 7 9  --  12  CREATININE 0.98 0.90 0.83 0.81  CALCIUM 8.9  --   --  8.8     Liver function tests:  Recent Labs  02/11/14 1930  AST 33  ALT 24  ALKPHOS 52  BILITOT 0.5  PROT 6.9  ALBUMIN 3.6   No results for input(s): LIPASE, AMYLASE in the last 72 hours.  CBC:  Recent Labs  02/11/14 1930  02/12/14 2205 02/13/14 0405  WBC 9.9  < > 10.6* 12.5*  NEUTROABS 8.2*  --   --   --   HGB 13.7  < > 14.7 14.4  HCT 40.8  < > 42.0 42.1  MCV 99.0  < > 98.6 99.8  PLT 181  < > 168 174  < > = values in this interval not displayed.  Cardiac Enzymes:  Recent Labs  02/11/14 1930 02/11/14 2207 02/12/14 0357 02/12/14 0927  TROPONINI 0.09* 0.93* 1.27* 2.17*    Coagulation Studies: No results for input(s): LABPROT, INR in the last 72 hours.  Other: Invalid input(s): POCBNP No results for input(s): DDIMER in the last 72 hours.  Recent Labs  02/11/14 2207  HGBA1C 5.8*   No results for input(s): CHOL, HDL, LDLCALC, TRIG, CHOLHDL in the last 72 hours.  Recent Labs  02/11/14 2207  TSH 1.427   No results for input(s): VITAMINB12, FOLATE, FERRITIN, TIBC, IRON, RETICCTPCT in the last 72 hours.   Other results:  EKG :    NSR , ST depression in the ant. Lat leads.     Medications:    Infusions: . sodium chloride 10 mL/hr at 02/13/14 0000  . albuterol 10 mg/hr (02/11/14 2051)    Scheduled Medications: . antiseptic oral rinse  7 mL Mouth Rinse q12n4p  . aspirin EC  325 mg Oral Daily  . carvedilol  25 mg Oral BID WC  . chlorhexidine  15 mL Mouth Rinse BID  . enoxaparin (LOVENOX) injection  1 mg/kg Subcutaneous Q12H  . folic acid  1 mg Oral Daily  . guaiFENesin  600 mg Oral BID  . hydrALAZINE  25 mg Oral 3 times per day  . lisinopril  5 mg Oral Daily  . multivitamin with minerals  1 tablet Oral Daily  . spironolactone  25 mg Oral Daily  . thiamine  100 mg Oral Daily   Or  . thiamine  100 mg Intravenous Daily    I have reviewed the cardiac cath angiograms.   It shows a critical mid RCA stenosis and severe mid LAD disease , moderate LM  disease   Assessment/ Plan:   1. CAD.  Pt was admitted with progressive dyspnea.    Had +_ troponins.  Cath revealed significant  3 V CAD.  He has been referred to TCTS. Is currently pain free.  Breathing ok   2. COPD - advised him to stop smoking   3. Essential HTN:  BP is fairly well controlled.  A bit elevated ths am.   4. Cigarette smoker - advised cessation   Disposition: keep in step down.  TCTS to see .  Length of Stay: 2  Thayer Headings, Brooke Bonito., MD, [facility] 02/13/2014, 11:08 AM Office [phone] Pager [phone]

## 2014-02-14 DIAGNOSIS — I10 Essential (primary) hypertension: Secondary | ICD-10-CM

## 2014-02-14 DIAGNOSIS — E785 Hyperlipidemia, unspecified: Secondary | ICD-10-CM

## 2014-02-14 DIAGNOSIS — Z720 Tobacco use: Secondary | ICD-10-CM

## 2014-02-14 LAB — PREALBUMIN: Prealbumin: 24.6 mg/dL (ref 17.0–34.0)

## 2014-02-14 LAB — COMPREHENSIVE METABOLIC PANEL
ALT: 19 U/L (ref 0–53)
AST: 23 U/L (ref 0–37)
Albumin: 2.9 g/dL — ABNORMAL LOW (ref 3.5–5.2)
Alkaline Phosphatase: 38 U/L — ABNORMAL LOW (ref 39–117)
Anion gap: 5 (ref 5–15)
BUN: 17 mg/dL (ref 6–23)
CO2: 28 mmol/L (ref 19–32)
Calcium: 8.4 mg/dL (ref 8.4–10.5)
Chloride: 97 mmol/L (ref 96–112)
Creatinine, Ser: 0.81 mg/dL (ref 0.50–1.35)
GFR calc Af Amer: >90 mL/min (ref >90)
GFR calc non Af Amer: >90 mL/min (ref >90)
Glucose, Bld: 135 mg/dL — ABNORMAL HIGH (ref 70–99)
Potassium: 3.5 mmol/L (ref 3.5–5.1)
Sodium: 130 mmol/L — ABNORMAL LOW (ref 135–145)
Total Bilirubin: 0.7 mg/dL (ref 0.3–1.2)
Total Protein: 5.3 g/dL — ABNORMAL LOW (ref 6.0–8.3)

## 2014-02-14 LAB — CBC
HCT: 38.7 % — ABNORMAL LOW (ref 39.0–52.0)
Hemoglobin: 13.1 g/dL (ref 13.0–17.0)
MCH: 33.9 pg (ref 26.0–34.0)
MCHC: 33.9 g/dL (ref 30.0–36.0)
MCV: 100.3 fL — ABNORMAL HIGH (ref 78.0–100.0)
Platelets: 140 10*3/uL — ABNORMAL LOW (ref 150–400)
RBC: 3.86 MIL/uL — ABNORMAL LOW (ref 4.22–5.81)
RDW: 13 % (ref 11.5–15.5)
WBC: 8.8 10*3/uL (ref 4.0–10.5)

## 2014-02-14 LAB — LIPID PANEL
Cholesterol: 218 mg/dL — ABNORMAL HIGH (ref 0–200)
HDL: 50 mg/dL (ref >39)
LDL Cholesterol: 139 mg/dL — ABNORMAL HIGH (ref 0–99)
Total CHOL/HDL Ratio: 4.4 RATIO
Triglycerides: 143 mg/dL (ref <150)
VLDL: 29 mg/dL (ref 0–40)

## 2014-02-14 LAB — OSMOLALITY, URINE: Osmolality, Ur: 562 mOsm/kg (ref 390–1090)

## 2014-02-14 LAB — PROTIME-INR
INR: 1.1 (ref 0.00–1.49)
Prothrombin Time: 14.3 seconds (ref 11.6–15.2)

## 2014-02-14 LAB — CREATININE, URINE, RANDOM: Creatinine, Urine: 91.28 mg/dL

## 2014-02-14 LAB — OSMOLALITY: Osmolality: 275 mOsm/kg (ref 275–300)

## 2014-02-14 LAB — SODIUM, URINE, RANDOM: Sodium, Ur: 48 mmol/L

## 2014-02-14 LAB — APTT: aPTT: 32 seconds (ref 24–37)

## 2014-02-14 MED ORDER — LEVOFLOXACIN IN D5W 750 MG/150ML IV SOLN
750.0000 mg | INTRAVENOUS | Status: DC
  Administered 2014-02-14 – 2014-02-16 (×3): 750 mg via INTRAVENOUS
  Filled 2014-02-14 (×3): qty 150

## 2014-02-14 MED ORDER — BUDESONIDE 0.5 MG/2ML IN SUSP
0.5000 mg | Freq: Two times a day (BID) | RESPIRATORY_TRACT | Status: DC
  Administered 2014-02-14 – 2014-02-20 (×10): 0.5 mg via RESPIRATORY_TRACT
  Filled 2014-02-14 (×15): qty 2

## 2014-02-14 MED ORDER — ALBUTEROL SULFATE (2.5 MG/3ML) 0.083% IN NEBU
2.5000 mg | INHALATION_SOLUTION | Freq: Four times a day (QID) | RESPIRATORY_TRACT | Status: DC
  Administered 2014-02-14 – 2014-02-17 (×10): 2.5 mg via RESPIRATORY_TRACT
  Filled 2014-02-14 (×12): qty 3

## 2014-02-14 MED ORDER — MOMETASONE FURO-FORMOTEROL FUM 100-5 MCG/ACT IN AERO
2.0000 | INHALATION_SPRAY | Freq: Two times a day (BID) | RESPIRATORY_TRACT | Status: DC
Start: 2014-02-14 — End: 2014-02-14
  Administered 2014-02-14: 2 via RESPIRATORY_TRACT
  Filled 2014-02-14: qty 8.8

## 2014-02-14 MED ORDER — TIOTROPIUM BROMIDE MONOHYDRATE 18 MCG IN CAPS
18.0000 ug | ORAL_CAPSULE | Freq: Every day | RESPIRATORY_TRACT | Status: DC
  Administered 2014-02-14 – 2014-02-16 (×3): 18 ug via RESPIRATORY_TRACT
  Filled 2014-02-14: qty 5

## 2014-02-14 NOTE — Progress Notes (Addendum)
Patient Demographics  Caleb [name], is a 64 y.o. male, DOB - [birth year], KZL:[id number]  Admit date - 02/11/2014   Admitting Physician Caleb Mull, MD  Outpatient Primary MD for the patient is Caleb Seller, MD  LOS - 3   Chief Complaint  Patient presents with  . Respiratory Distress        Subjective:   Caleb [name] today has, No headache, No chest pain, No abdominal pain - No Nausea, No new weakness tingling or numbness, No Cough -  Improved SOB.    Assessment & Plan    1. COPD without exacerbation - ABG not impressive, no wheezing on exam, stopped steroids , added antibiotics per [facility] request. Added Spiriva, Dulera and PRN Nebs, titrate off COPD, per [facility] PCCM input before CABG.    2. NSTEMI Elevated troponin. This was the primary problem causing shortness of breath, cardiology on board, currently on full dose Lovenox along with aspirin, beta blocker and statin for secondary prevention. Left heart cath shows severe triple-vessel disease, consulted cardiothoracic surgery. He is currently pain-free. Likely CABG in 1-2 days.    3. Essential Hypertension - poor compliance with home medications, home medications resumed and added hydralazine for better control, monitor blood pressure.    4. Ongoing smoking and Alcohol abuse. Counseled to quit both. CIWA protocol.    5. 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 Compensated.    6.Mild hyponatremia, Euvolemic  - check Ur lytes and Osm, fluid restrict.     Code Status: Full  Family Communication: None present  Disposition Plan: Home   Procedures   TTE  - There is akinesis in  the basal and mid inferoseptal, inferior, inferolateral walls, all of the apical sepgments and in the trueapex.This is suggestive of multivessel disease.Overall LVEF is severely impaired estimated at 30-35%.There is abnormal relaxation and normal size left atrium,suggestive that LV dysfunction is fairly recent.RV size and function is normal.    Left heart cath 02-12-14 Dr Caleb Julian   IMPRESSIONS: 1. Severe three-vessel coronary disease with high-grade distal RCA, occlusion of the PDA, total occlusion of the proximal to mid circumflex, and high-grade obstruction in the proximal to mid LAD. The LAD supplies collaterals to the PDA and there are left to left collaterals to the circumflex.  2. Severe left ventricular systolic dysfunction with an EF of 25%, elevated LVEDP, and left ventricular enlargement. Findings are compatible with an ischemic cardiomyopathy.   RECOMMENDATION: IV furosemide to establish diuresis.  Supplemental oxygen to keep O2 saturation greater than 90.  Transferred to the TCU/2H stepdown unit to be more closely observed.  The patient's coronary anatomy is surgical. He will need to have surgical consultation (not called as case completed at 7:10P) to determine if he is indeed a surgical candidate. Comorbidities may exclude surgery as a treatment. The anatomy is not ideally suited for PCI given heavy calcification, multifocal lesions, and total occlusions of Cfx and PDA..    Consults Cardiology, [facility]   Medications  Scheduled Meds: . antiseptic oral rinse  7 mL Mouth Rinse q12n4p  . aspirin EC  325 mg Oral Daily  . carvedilol  25 mg Oral BID WC  . ceFEPime (MAXIPIME) IV  1 g Intravenous Q8H  . chlorhexidine  15 mL Mouth Rinse BID  . enoxaparin (LOVENOX) injection  1 mg/kg Subcutaneous Q12H  . folic acid  1 mg Oral Daily  . guaiFENesin  600 mg Oral BID  . hydrALAZINE  25 mg Oral 3 times per day  . lisinopril  5 mg Oral Daily  . mometasone-formoterol  2 puff  Inhalation BID  . multivitamin with minerals  1 tablet Oral Daily  . spironolactone  25 mg Oral Daily  . thiamine  100 mg Oral Daily  . tiotropium  18 mcg Inhalation Daily   Continuous Infusions: . sodium chloride 10 mL/hr at 02/13/14 0000   PRN Meds:.acetaminophen, ipratropium-albuterol, LORazepam **OR** LORazepam, ondansetron (ZOFRAN) IV, oxyCODONE-acetaminophen  DVT Prophylaxis   Heparin   Gtt  Lab Results  Component Value Date   PLT 140* 02/14/2014    Antibiotics     Anti-infectives    Start     Dose/Rate Route Frequency Ordered Stop   02/13/14 1630  ceFEPIme (MAXIPIME) 1 g in dextrose 5 % 50 mL IVPB     1 g100 mL/hr over 30 Minutes Intravenous Every 8 hours 02/13/14 1603     02/11/14 2130  cefTRIAXone (ROCEPHIN) 1 g in dextrose 5 % 50 mL IVPB  Status:  Discontinued     1 g100 mL/hr over 30 Minutes Intravenous Every 24 hours 02/11/14 2128 02/12/14 1139   02/11/14 2130  azithromycin (ZITHROMAX) 500 mg in dextrose 5 % 250 mL IVPB  Status:  Discontinued     500 mg250 mL/hr over 60 Minutes Intravenous Every 24 hours 02/11/14 2128 02/13/14 1048          Objective:   Filed Vitals:   02/14/14 0357 02/14/14 0400 02/14/14 0500 02/14/14 0642  BP: 133/69 125/53  123/59  Pulse: 62 58    Temp: 97.9 F (36.6 C)     TempSrc: Oral     Resp: 15 14    Height:      Weight:   62.7 kg (138 lb 3.7 oz)   SpO2: 97% 97%      Wt Readings from Last 3 Encounters:  02/14/14 62.7 kg (138 lb 3.7 oz)     Intake/Output Summary (Last 24 hours) at 02/14/14 0809 Last data filed at 02/14/14 0001  Gross per 24 hour  Intake    880 ml  Output    600 ml  Net    280 ml     Physical Exam  Awake Alert, Oriented X 3, No new F.N deficits, Normal affect [place].AT,PERRAL Supple Neck,No JVD, No cervical lymphadenopathy appriciated.  Symmetrical Chest wall movement, Good air movement bilaterally, CTAB RRR,No Gallops,Rubs or new Murmurs, No Parasternal Heave +ve B.Sounds, Abd Soft, No tenderness,  No organomegaly appriciated, No rebound - guarding or rigidity. No Cyanosis, Clubbing or edema, No new Rash or bruise      Data Review   Micro Results Recent Results (from the past 240 hour(s))  MRSA PCR Screening     Status: None   Collection Time: 02/11/14 10:41 PM  Result Value Ref Range Status   MRSA by PCR NEGATIVE NEGATIVE Final    Comment:        The GeneXpert MRSA Assay (FDA approved for NASAL specimens only), is one component of a comprehensive MRSA colonization surveillance program. It is not intended to diagnose MRSA infection nor to guide or monitor treatment for MRSA infections.  Radiology Reports Ct Angio Chest Pe W/cm &/or Wo Cm  02/13/2014   CLINICAL DATA:  Shortness of breath for 1 week.  EXAM: CT ANGIOGRAPHY CHEST WITH CONTRAST  TECHNIQUE: Multidetector CT imaging of the chest was performed using the standard protocol during bolus administration of intravenous contrast. Multiplanar CT image reconstructions and MIPs were obtained to evaluate the vascular anatomy.  CONTRAST:  80 mL OMNIPAQUE IOHEXOL 350 MG/ML SOLN  COMPARISON:  Single view of the chest 02/11/2014.  FINDINGS: No pulmonary embolus is identified. Small to moderate bilateral pleural effusions are seen. There is no pericardial effusion. Cardiomegaly is identified. There is calcific aortic and coronary atherosclerosis. No axillary, hilar or mediastinal lymphadenopathy is seen. The lungs demonstrate some compressive and dependent atelectatic change but are otherwise clear.  Visualized upper abdomen shows a few small calcifications in the liver consistent with old granulomatous disease. Imaged upper abdomen is otherwise unremarkable. Remote lower left rib fractures are noted. The patient has remote bilateral rib fractures. No lytic or sclerotic bony lesion is seen.  Review of the MIP images confirms the above findings.  IMPRESSION: Negative for pulmonary embolus.  Small to moderate bilateral pleural effusions  with associated mild compressive atelectasis.  Cardiomegaly without CT evidence of pulmonary edema.  Calcific aortic and coronary atherosclerosis.   Electronically Signed   By: Inge Rise M.D.   On: 02/13/2014 18:30   Dg Chest Port 1 View  02/11/2014   CLINICAL DATA:  Respiratory distress.  Smoker.  Hypertension.  EXAM: PORTABLE CHEST - 1 VIEW  COMPARISON:  None.  FINDINGS: The heart size and mediastinal contours are within normal limits. Both lungs are clear. Pulmonary hyperinflation noted and suspicious for COPD. No evidence of pleural effusion or pneumothorax. The visualized skeletal structures are unremarkable.  IMPRESSION: Pulmonary hyperinflation, suspicious for COPD.  No acute findings.   Electronically Signed   By: Earle Gell M.D.   On: 02/11/2014 19:34     CBC  Recent Labs Lab 02/11/14 1930 02/11/14 1939 02/12/14 1448 02/12/14 2205 02/13/14 0405 02/14/14 0243  WBC 9.9  --  11.0* 10.6* 12.5* 8.8  HGB 13.7 15.6 14.3 14.7 14.4 13.1  HCT 40.8 46.0 41.3 42.0 42.1 38.7*  PLT 181  --  173 168 174 140*  MCV 99.0  --  96.5 98.6 99.8 100.3*  MCH 33.3  --  33.4 34.5* 34.1* 33.9  MCHC 33.6  --  34.6 35.0 34.2 33.9  RDW 12.9  --  12.9 12.9 13.1 13.0  LYMPHSABS 1.0  --   --   --   --   --   MONOABS 0.6  --   --   --   --   --   EOSABS 0.1  --   --   --   --   --   BASOSABS 0.0  --   --   --   --   --     Chemistries   Recent Labs Lab 02/11/14 1930 02/11/14 1939 02/12/14 2205 02/13/14 0405 02/14/14 0243  [name] 131* 132*  --  130* 130*  K 4.3 4.2  --  3.7 3.5  CL 97 98  --  96 97  CO2 23  --   --  25 28  GLUCOSE 221* 220*  --  145* 135*  BUN 7 9  --  12 17  CREATININE 0.98 0.90 0.83 0.81 0.81  CALCIUM 8.9  --   --  8.8 8.4  AST 33  --   --   --  23  ALT 24  --   --   --  19  ALKPHOS 52  --   --   --  38*  BILITOT 0.5  --   --   --  0.7   ------------------------------------------------------------------------------------------------------------------ estimated  creatinine clearance is 79.1 mL/min (by C-G formula based on Cr of 0.81). ------------------------------------------------------------------------------------------------------------------  Recent Labs  02/11/14 2207  HGBA1C 5.8*   ------------------------------------------------------------------------------------------------------------------  Recent Labs  02/14/14 0243  CHOL 218*  HDL 50  LDLCALC 139*  TRIG 143  CHOLHDL 4.4   ------------------------------------------------------------------------------------------------------------------  Recent Labs  02/11/14 2207  TSH 1.427   ------------------------------------------------------------------------------------------------------------------ No results for input(s): VITAMINB12, FOLATE, FERRITIN, TIBC, IRON, RETICCTPCT in the last 72 hours.  Coagulation profile  Recent Labs Lab 02/14/14 0243  INR 1.10    No results for input(s): DDIMER in the last 72 hours.  Cardiac Enzymes  Recent Labs Lab 02/11/14 2207 02/12/14 0357 02/12/14 0927  TROPONINI 0.93* 1.27* 2.17*   ------------------------------------------------------------------------------------------------------------------ Invalid input(s): POCBNP     Time Spent in minutes  35   Corra Kaine K M.D on 02/14/2014 at 8:09 AM  Between 7am to 7pm - Pager - [phone]  After 7pm go to [email or web address] - [facility] Office  [phone]

## 2014-02-14 NOTE — Progress Notes (Signed)
Patient called out and stated his IV was bleeding. Small amount of blood on bed sheets and gown. IV line disconnected from hub. Patient stated he didn't touch his IV. Patient cleaned up gown changed and linens changed. While standing at bedside patient trembling and arms and legs shaking. CIWA score equaled 11 at 0100. Ativan 1mg  given PO. Will monitor closely.

## 2014-02-14 NOTE — Consult Note (Signed)
Dictation #:  [id number]

## 2014-02-14 NOTE — Progress Notes (Signed)
PROGRESS NOTE  Subjective:   Caleb [name] is a 64 yo with hx of COPD and CAD. Was admitted with unstable angina.  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 Denies any pain at this time   Objective:    Vital Signs:   Temp:  [97.8 F (36.6 C)-98.4 F (36.9 C)] 97.9 F (36.6 C) (01/31 0815) Pulse Rate:  [58-109] 65 (01/31 1100) Resp:  [14-19] 16 (01/31 1100) BP: (109-139)/(48-72) 139/54 mmHg (01/31 0915) SpO2:  [94 %-100 %] 97 % (01/31 1100) Weight:  [138 lb 3.7 oz (62.7 kg)] 138 lb 3.7 oz (62.7 kg) (01/31 0500)  Last BM Date: 02/12/14   24-hour weight change: Weight change: -2 lb 13.9 oz (-1.3 kg)  Weight trends: Filed Weights   02/12/14 0500 02/13/14 0345 02/14/14 0500  Weight: 143 lb 1.3 oz (64.9 kg) 141 lb 1.5 oz (64 kg) 138 lb 3.7 oz (62.7 kg)    Intake/Output:  01/30 0701 - 01/31 0700 In: 2751 [P.O.:1100; I.V.:90; IV Piggyback:100] Out: 875 [Urine:875] Total I/O In: 360 [P.O.:360] Out: 550 [Urine:550]   Physical Exam: BP 139/54 mmHg  Pulse 65  Temp(Src) 97.9 F (36.6 C) (Oral)  Resp 16  Ht 5\' 5"  (1.651 m)  Wt 138 lb 3.7 oz (62.7 kg)  BMI 23.00 kg/m2  SpO2 97%  Wt Readings from Last 3 Encounters:  02/14/14 138 lb 3.7 oz (62.7 kg)    General: Vital signs reviewed and noted.   Head: Normocephalic, atraumatic.  Eyes: conjunctivae/corneas clear.  EOM's intact.   Throat: normal  Neck:  normal   Lungs:    basilar rales , mmostly cleared with deep breaths  Heart:   RR, normal S1S2   Abdomen:  Soft, non-tender, non-distended    Extremities: Right radial cath site look ok    Neurologic: A&O X3, CN II - XII are grossly intact.   Psych: Normal     Labs: BMET:  Recent Labs  02/13/14 0405 02/14/14 0243  NA 130* 130*  K 3.7 3.5  CL 96 97  CO2 25 28  GLUCOSE 145* 135*  BUN 12 17  CREATININE 0.81 0.81  CALCIUM 8.8 8.4    Liver function tests:  Recent Labs  02/11/14 1930 02/14/14 0243  AST 33 23  ALT 24  19  ALKPHOS 52 38*  BILITOT 0.5 0.7  PROT 6.9 5.3*  ALBUMIN 3.6 2.9*   No results for input(s): LIPASE, AMYLASE in the last 72 hours.  CBC:  Recent Labs  02/11/14 1930  02/13/14 0405 02/14/14 0243  WBC 9.9  < > 12.5* 8.8  NEUTROABS 8.2*  --   --   --   HGB 13.7  < > 14.4 13.1  HCT 40.8  < > 42.1 38.7*  MCV 99.0  < > 99.8 100.3*  PLT 181  < > 174 140*  < > = values in this interval not displayed.  Cardiac Enzymes:  Recent Labs  02/11/14 1930 02/11/14 2207 02/12/14 0357 02/12/14 0927  TROPONINI 0.09* 0.93* 1.27* 2.17*    Coagulation Studies:  Recent Labs  02/14/14 0243  LABPROT 14.3  INR 1.10    Other: Invalid input(s): POCBNP No results for input(s): DDIMER in the last 72 hours.  Recent Labs  02/11/14 2207  HGBA1C 5.8*    Recent Labs  02/14/14 0243  CHOL 218*  HDL 50  LDLCALC 139*  TRIG 143  CHOLHDL 4.4    Recent Labs  02/11/14  2207  TSH 1.427   No results for input(s): VITAMINB12, FOLATE, FERRITIN, TIBC, IRON, RETICCTPCT in the last 72 hours.   Other results:  EKG :    NSR , ST depression in the ant. Lat leads.     Medications:    Infusions: . sodium chloride 10 mL/hr at 02/13/14 0000    Scheduled Medications: . aspirin EC  325 mg Oral Daily  . carvedilol  25 mg Oral BID WC  . enoxaparin (LOVENOX) injection  1 mg/kg Subcutaneous Q12H  . folic acid  1 mg Oral Daily  . guaiFENesin  600 mg Oral BID  . hydrALAZINE  25 mg Oral 3 times per day  . lisinopril  5 mg Oral Daily  . mometasone-formoterol  2 puff Inhalation BID  . multivitamin with minerals  1 tablet Oral Daily  . spironolactone  25 mg Oral Daily  . thiamine  100 mg Oral Daily  . tiotropium  18 mcg Inhalation Daily    I have reviewed the cardiac cath angiograms.   It shows a critical mid RCA stenosis and severe mid LAD disease , moderate LM disease   Assessment/ Plan:   1. CAD.  Pt was admitted with progressive dyspnea.    Had +_ troponins.  Cath revealed  significant  3 V CAD.  He has been referred to TCTS. Is currently pain free.  Breathing ok  2. COPD - advised him to stop smoking   3. Essential HTN:  BP is fairly well controlled.   4. Cigarette smoker - advised cessation  Disposition:      Length of Stay: 3  Thayer Headings, Brooke Bonito., MD, [facility] 02/14/2014, 11:15 AM Office [phone] Pager [phone]

## 2014-02-14 NOTE — Progress Notes (Signed)
ANTIBIOTIC CONSULT NOTE - INITIAL  Pharmacy Consult for Levaquin Indication: rule out pneumonia  No Known Allergies  Patient Measurements: Height: 5\' 5"  (165.1 cm) Weight: 138 lb 3.7 oz (62.7 kg) IBW/kg (Calculated) : 61.5   Vital Signs: Temp: 98 F (36.7 C) (01/31 1626) Temp Source: Oral (01/31 1626) BP: 147/65 mmHg (01/31 1600) Pulse Rate: 64 (01/31 1600) Intake/Output from previous day: 01/30 0701 - 01/31 0700 In: 9892 [P.O.:1100; I.V.:90; IV Piggyback:100] Out: 875 [Urine:875] Intake/Output from this shift: Total I/O In: 660 [P.O.:660] Out: 850 [Urine:850]  Labs:  Recent Labs  02/12/14 2205 02/13/14 0405 02/14/14 0243 02/14/14 1355  WBC 10.6* 12.5* 8.8  --   HGB 14.7 14.4 13.1  --   PLT 168 174 140*  --   LABCREA  --   --   --  91.28  CREATININE 0.83 0.81 0.81  --    Estimated Creatinine Clearance: 79.1 mL/min (by C-G formula based on Cr of 0.81). No results for input(s): VANCOTROUGH, VANCOPEAK, VANCORANDOM, GENTTROUGH, GENTPEAK, GENTRANDOM, TOBRATROUGH, TOBRAPEAK, TOBRARND, AMIKACINPEAK, AMIKACINTROU, AMIKACIN in the last 72 hours.   Microbiology: Recent Results (from the past 720 hour(s))  MRSA PCR Screening     Status: None   Collection Time: 02/11/14 10:41 PM  Result Value Ref Range Status   MRSA by PCR NEGATIVE NEGATIVE Final    Comment:        The GeneXpert MRSA Assay (FDA approved for NASAL specimens only), is one component of a comprehensive MRSA colonization surveillance program. It is not intended to diagnose MRSA infection nor to guide or monitor treatment for MRSA infections.     Medical History: Past Medical History  Diagnosis Date  . Acute respiratory failure with hypoxia   . COPD (chronic obstructive pulmonary disease) 02/11/2014  . Acute systolic congestive heart failure 02/11/2014  . Tobacco abuse   . Essential hypertension   . Hypercholesteremia     Medications:  Prescriptions prior to admission  Medication Sig Dispense  Refill Last Dose  . amLODipine (NORVASC) 5 MG tablet Take 5 mg by mouth daily.   Past Week at Unknown time  . carvedilol (COREG) 25 MG tablet Take 25 mg by mouth 2 (two) times daily with a meal.   02/11/2014 at 0800  . pravastatin (PRAVACHOL) 40 MG tablet Take 40 mg by mouth daily.   Past Week at Unknown time  . spironolactone (ALDACTONE) 25 MG tablet Take 25 mg by mouth daily.   Past Week at Unknown time   Scheduled:  . albuterol  2.5 mg Nebulization QID  . aspirin EC  325 mg Oral Daily  . budesonide (PULMICORT) nebulizer solution  0.5 mg Nebulization BID  . carvedilol  25 mg Oral BID WC  . enoxaparin (LOVENOX) injection  1 mg/kg Subcutaneous Q12H  . folic acid  1 mg Oral Daily  . guaiFENesin  600 mg Oral BID  . hydrALAZINE  25 mg Oral 3 times per day  . lisinopril  5 mg Oral Daily  . multivitamin with minerals  1 tablet Oral Daily  . spironolactone  25 mg Oral Daily  . thiamine  100 mg Oral Daily  . tiotropium  18 mcg Inhalation Daily   Infusions:  . sodium chloride 10 mL/hr at 02/13/14 0000   Assessment: 64yo male here for NSTEMI. Pharmacy is consulted to dose levaquin for possible CAP. Pt is afebrile, WBC 8.8, sCr 0.81 and QTc 487.  Goal of Therapy:  Resolution of infection  Plan:  Levaquin 750mg  IV  q24h Expected duration 7 days with resolution of temperature and/or normalization of WBC Follow up culture results, renal function, and clinical course  Andrey Cota. Diona Foley, PharmD Clinical Pharmacist Pager [phone] 02/14/2014,4:30 PM

## 2014-02-15 ENCOUNTER — Inpatient Hospital Stay (HOSPITAL_COMMUNITY): Payer: BLUE CROSS/BLUE SHIELD

## 2014-02-15 ENCOUNTER — Other Ambulatory Visit: Payer: Self-pay | Admitting: *Deleted

## 2014-02-15 DIAGNOSIS — I5022 Chronic systolic (congestive) heart failure: Secondary | ICD-10-CM | POA: Diagnosis present

## 2014-02-15 DIAGNOSIS — I509 Heart failure, unspecified: Secondary | ICD-10-CM

## 2014-02-15 DIAGNOSIS — I5021 Acute systolic (congestive) heart failure: Secondary | ICD-10-CM

## 2014-02-15 DIAGNOSIS — J410 Simple chronic bronchitis: Secondary | ICD-10-CM

## 2014-02-15 DIAGNOSIS — I2510 Atherosclerotic heart disease of native coronary artery without angina pectoris: Secondary | ICD-10-CM | POA: Diagnosis present

## 2014-02-15 LAB — PULMONARY FUNCTION TEST
FEF 25-75 Post: 1.68 L/sec
FEF 25-75 Pre: 1.44 L/sec
FEF2575-%Change-Post: 16 %
FEF2575-%Pred-Post: 74 %
FEF2575-%Pred-Pre: 63 %
FEV1-%Change-Post: 2 %
FEV1-%Pred-Post: 71 %
FEV1-%Pred-Pre: 69 %
FEV1-Post: 2.01 L
FEV1-Pre: 1.95 L
FEV1FVC-%Change-Post: 0 %
FEV1FVC-%Pred-Pre: 100 %
FEV6-%Change-Post: 3 %
FEV6-%Pred-Post: 73 %
FEV6-%Pred-Pre: 71 %
FEV6-Post: 2.61 L
FEV6-Pre: 2.54 L
FEV6FVC-%Change-Post: 0 %
FEV6FVC-%Pred-Post: 104 %
FEV6FVC-%Pred-Pre: 104 %
FVC-%Change-Post: 2 %
FVC-%Pred-Post: 70 %
FVC-%Pred-Pre: 68 %
FVC-Post: 2.67 L
FVC-Pre: 2.59 L
Post FEV1/FVC ratio: 75 %
Post FEV6/FVC ratio: 98 %
Pre FEV1/FVC ratio: 75 %
Pre FEV6/FVC Ratio: 98 %

## 2014-02-15 LAB — HEMOGLOBIN A1C
Hgb A1c MFr Bld: 5.2 % (ref 4.8–5.6)
Mean Plasma Glucose: 103 mg/dL

## 2014-02-15 LAB — CBC
HCT: 38.7 % — ABNORMAL LOW (ref 39.0–52.0)
Hemoglobin: 13.1 g/dL (ref 13.0–17.0)
MCH: 33.1 pg (ref 26.0–34.0)
MCHC: 33.9 g/dL (ref 30.0–36.0)
MCV: 97.7 fL (ref 78.0–100.0)
Platelets: 129 10*3/uL — ABNORMAL LOW (ref 150–400)
RBC: 3.96 MIL/uL — ABNORMAL LOW (ref 4.22–5.81)
RDW: 12.7 % (ref 11.5–15.5)
WBC: 6 10*3/uL (ref 4.0–10.5)

## 2014-02-15 LAB — BASIC METABOLIC PANEL
Anion gap: 6 (ref 5–15)
BUN: 14 mg/dL (ref 6–23)
CO2: 26 mmol/L (ref 19–32)
Calcium: 8.3 mg/dL — ABNORMAL LOW (ref 8.4–10.5)
Chloride: 96 mmol/L (ref 96–112)
Creatinine, Ser: 0.82 mg/dL (ref 0.50–1.35)
GFR calc Af Amer: >90 mL/min (ref >90)
GFR calc non Af Amer: >90 mL/min (ref >90)
Glucose, Bld: 103 mg/dL — ABNORMAL HIGH (ref 70–99)
Potassium: 3.6 mmol/L (ref 3.5–5.1)
Sodium: 128 mmol/L — ABNORMAL LOW (ref 135–145)

## 2014-02-15 LAB — SURGICAL PCR SCREEN
MRSA, PCR: NEGATIVE
Staphylococcus aureus: NEGATIVE

## 2014-02-15 LAB — PROTIME-INR
INR: 1.02 (ref 0.00–1.49)
Prothrombin Time: 13.5 seconds (ref 11.6–15.2)

## 2014-02-15 MED ORDER — CHLORDIAZEPOXIDE HCL 5 MG PO CAPS
5.0000 mg | ORAL_CAPSULE | Freq: Three times a day (TID) | ORAL | Status: DC
  Administered 2014-02-15 – 2014-02-16 (×6): 5 mg via ORAL
  Filled 2014-02-15 (×6): qty 1

## 2014-02-15 MED ORDER — FUROSEMIDE 10 MG/ML IJ SOLN
20.0000 mg | Freq: Once | INTRAMUSCULAR | Status: AC
  Administered 2014-02-15: 20 mg via INTRAVENOUS
  Filled 2014-02-15: qty 2

## 2014-02-15 MED ORDER — POLYETHYLENE GLYCOL 3350 17 G PO PACK
17.0000 g | PACK | Freq: Two times a day (BID) | ORAL | Status: DC
  Administered 2014-02-15 – 2014-02-16 (×3): 17 g via ORAL
  Filled 2014-02-15 (×5): qty 1

## 2014-02-15 MED ORDER — OXYCODONE-ACETAMINOPHEN 5-325 MG PO TABS: 1.0000 | ORAL_TABLET | ORAL | Status: DC | PRN

## 2014-02-15 MED ORDER — ENOXAPARIN SODIUM 60 MG/0.6ML ~~LOC~~ SOLN
1.0000 mg/kg | Freq: Two times a day (BID) | SUBCUTANEOUS | Status: AC
  Administered 2014-02-15 – 2014-02-16 (×2): 60 mg via SUBCUTANEOUS
  Filled 2014-02-15 (×3): qty 0.6

## 2014-02-15 MED ORDER — DOCUSATE SODIUM 100 MG PO CAPS
200.0000 mg | ORAL_CAPSULE | Freq: Two times a day (BID) | ORAL | Status: DC
  Administered 2014-02-15 – 2014-02-16 (×4): 200 mg via ORAL
  Filled 2014-02-15 (×6): qty 2

## 2014-02-15 MED ORDER — FUROSEMIDE 10 MG/ML IJ SOLN
40.0000 mg | Freq: Once | INTRAMUSCULAR | Status: AC
  Administered 2014-02-16: 40 mg via INTRAVENOUS
  Filled 2014-02-15: qty 4

## 2014-02-15 NOTE — Progress Notes (Signed)
CARDIAC REHAB PHASE I   PRE:  Rate/Rhythm: 72 SR    BP: sitting 122/53    SaO2: 98 RA  MODE:  Ambulation: 390 ft   POST:  Rate/Rhythm: 93 SR    BP: sitting 119/47     SaO2: 96 RA  Tolerated fairly well. Denied SOB or CP. Slightly off balance, he sts due to neuropathy. Encouraged him to bring in his shoes for walking. Discussed sternal precautions, mobility, IS. Gave [facility] booklet, guideline and instructions to watch preop video. Voiced understanding. Wants to know when his surgery is.  [id number]   Josephina Shih [place] CES, ACSM 02/15/2014 2:15 PM

## 2014-02-15 NOTE — Progress Notes (Signed)
Patient Demographics  Caleb [name], is a 64 y.o. male, DOB - 12-Feb-1950, VAN:[id number]  Admit date - 02/11/2014   Admitting Physician Berle Mull, MD  Outpatient Primary MD for the patient is [name], MD  LOS - 4   Chief Complaint  Patient presents with  . Respiratory Distress      Brief summary  This is a 64 year old Caucasian gentleman with history of alcohol and smoking abuse who was admitted to the hospital for shortness of breath, this was initially thought to be COPD exacerbation however further workup revealed that  his shortness of breath was secondary to non-ST elevation MI. He was seen by cardiology, pulmonary, underwent left heart cath showing severe three-vessel disease, now seen by thoracic surgery. Due for open heart surgery later this week. His pulmonary status is stable and he is oxygen free.    Subjective:   Caleb [name] today has, No headache, No chest pain, No abdominal pain - No Nausea, No new weakness tingling or numbness, No Cough -  Improved SOB.    Assessment & Plan    1. COPD without exacerbation - ABG not impressive, no wheezing on exam, stopped steroids , added Levaquin per [facility] request prior to CABG. Added Spiriva, Dulera and PRN Nebs, titrate off COPD, per [facility] PCCM input before CABG he twisted and appreciated. No acute issues he is off oxygen.    2. NSTEMI Elevated troponin. This was the primary problem causing shortness of breath, cardiology on board, currently on full dose Lovenox along with aspirin, beta blocker and statin for secondary prevention. Left heart cath shows severe triple-vessel disease, consulted cardiothoracic surgery. He is currently pain-free. Likely CABG in 1-2 days. He is undergoing preop vascular ultrasound.    3. Essential Hypertension  - poor compliance with home medications, home medications resumed and added hydralazine for better control, monitor blood pressure.    4. Ongoing smoking and Alcohol abuse. Counseled to quit both. CIWA protocol.    5. 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 Compensated.    6.Mild hyponatremia, Euvolemic  - per electrolytes likely SIADH, fluid restriction and gentle Lasix. Repeat BMP.    Code Status: Full  Family Communication: None present  Disposition Plan: Home   Procedures   TTE  - There is akinesis in the basal and mid inferoseptal, inferior, inferolateral walls, all of the apical sepgments and in the trueapex.This is suggestive of multivessel disease.Overall LVEF is severely impaired estimated at 30-35%.There is abnormal relaxation and normal size left atrium,suggestive that LV dysfunction is fairly recent.RV size and function is normal.   Vascular ultrasound pending.    Left heart cath 02-12-14 Dr Tamala Julian   IMPRESSIONS: 1. Severe three-vessel coronary disease with high-grade distal RCA, occlusion of the PDA, total occlusion of the proximal to mid circumflex, and high-grade obstruction in the proximal to mid LAD. The LAD supplies collaterals to the PDA and there are left to left collaterals to the circumflex.  2. Severe left ventricular systolic dysfunction with an EF of 25%, elevated LVEDP, and left ventricular enlargement. Findings are compatible with an ischemic cardiomyopathy.   RECOMMENDATION: IV furosemide to  establish diuresis.  Supplemental oxygen to keep O2 saturation greater than 90.  Transferred to the TCU/2H stepdown unit to be more closely observed.  The patient's coronary anatomy is surgical. He will need to have surgical consultation (not called as case completed at 7:10P) to determine if he is  indeed a surgical candidate. Comorbidities may exclude surgery as a treatment. The anatomy is not ideally suited for PCI given heavy calcification, multifocal lesions, and total occlusions of Cfx and PDA..    Consults Cardiology, [facility], pulmonary   Medications  Scheduled Meds: . albuterol  2.5 mg Nebulization QID  . aspirin EC  325 mg Oral Daily  . budesonide (PULMICORT) nebulizer solution  0.5 mg Nebulization BID  . carvedilol  25 mg Oral BID WC  . chlordiazePOXIDE  5 mg Oral TID  . docusate sodium  200 mg Oral BID  . enoxaparin (LOVENOX) injection  1 mg/kg Subcutaneous Q12H  . folic acid  1 mg Oral Daily  . furosemide  20 mg Intravenous Once  . guaiFENesin  600 mg Oral BID  . hydrALAZINE  25 mg Oral 3 times per day  . levofloxacin (LEVAQUIN) IV  750 mg Intravenous Q24H  . lisinopril  5 mg Oral Daily  . multivitamin with minerals  1 tablet Oral Daily  . polyethylene glycol  17 g Oral BID  . spironolactone  25 mg Oral Daily  . thiamine  100 mg Oral Daily  . tiotropium  18 mcg Inhalation Daily   Continuous Infusions: . sodium chloride 10 mL/hr at 02/13/14 0000   PRN Meds:.acetaminophen, ipratropium-albuterol, ondansetron (ZOFRAN) IV, oxyCODONE-acetaminophen  DVT Prophylaxis   Heparin   Gtt  Lab Results  Component Value Date   PLT 129* 02/15/2014    Antibiotics     Anti-infectives    Start     Dose/Rate Route Frequency Ordered Stop   02/14/14 1700  levofloxacin (LEVAQUIN) IVPB 750 mg     750 mg100 mL/hr over 90 Minutes Intravenous Every 24 hours 02/14/14 1635     02/13/14 1630  ceFEPIme (MAXIPIME) 1 g in dextrose 5 % 50 mL IVPB  Status:  Discontinued     1 g100 mL/hr over 30 Minutes Intravenous Every 8 hours 02/13/14 1603 02/14/14 0920   02/11/14 2130  cefTRIAXone (ROCEPHIN) 1 g in dextrose 5 % 50 mL IVPB  Status:  Discontinued     1 g100 mL/hr over 30 Minutes Intravenous Every 24 hours 02/11/14 2128 02/12/14 1139   02/11/14 2130  azithromycin (ZITHROMAX) 500 mg in  dextrose 5 % 250 mL IVPB  Status:  Discontinued     500 mg250 mL/hr over 60 Minutes Intravenous Every 24 hours 02/11/14 2128 02/13/14 1048          Objective:   Filed Vitals:   02/15/14 0345 02/15/14 0624 02/15/14 0752 02/15/14 0850  BP: 157/75 163/74 144/71   Pulse: 66  72   Temp: 98.5 F (36.9 C)  98.6 F (37 C)   TempSrc: Oral  Oral   Resp: 20  14   Height:      Weight: 60.4 kg (133 lb 2.5 oz)     SpO2: 95%  98% 99%    Wt Readings from Last 3 Encounters:  02/15/14 60.4 kg (133 lb 2.5 oz)     Intake/Output Summary (Last 24 hours) at 02/15/14 0939 Last data filed at 02/15/14 0900  Gross per 24 hour  Intake   1370 ml  Output   2925 ml  Net  -  1555 ml     Physical Exam  Awake Alert, Oriented X 3, No new F.N deficits, Normal affect [place].AT,PERRAL Supple Neck,No JVD, No cervical lymphadenopathy appriciated.  Symmetrical Chest wall movement, Good air movement bilaterally, CTAB RRR,No Gallops,Rubs or new Murmurs, No Parasternal Heave +ve B.Sounds, Abd Soft, No tenderness, No organomegaly appriciated, No rebound - guarding or rigidity. No Cyanosis, Clubbing or edema, No new Rash or bruise      Data Review   Micro Results Recent Results (from the past 240 hour(s))  MRSA PCR Screening     Status: None   Collection Time: 02/11/14 10:41 PM  Result Value Ref Range Status   MRSA by PCR NEGATIVE NEGATIVE Final    Comment:        The GeneXpert MRSA Assay (FDA approved for NASAL specimens only), is one component of a comprehensive MRSA colonization surveillance program. It is not intended to diagnose MRSA infection nor to guide or monitor treatment for MRSA infections.     Radiology Reports Ct Angio Chest Pe W/cm &/or Wo Cm  02/13/2014   CLINICAL DATA:  Shortness of breath for 1 week.  EXAM: CT ANGIOGRAPHY CHEST WITH CONTRAST  TECHNIQUE: Multidetector CT imaging of the chest was performed using the standard protocol during bolus administration of intravenous  contrast. Multiplanar CT image reconstructions and MIPs were obtained to evaluate the vascular anatomy.  CONTRAST:  80 mL OMNIPAQUE IOHEXOL 350 MG/ML SOLN  COMPARISON:  Single view of the chest 02/11/2014.  FINDINGS: No pulmonary embolus is identified. Small to moderate bilateral pleural effusions are seen. There is no pericardial effusion. Cardiomegaly is identified. There is calcific aortic and coronary atherosclerosis. No axillary, hilar or mediastinal lymphadenopathy is seen. The lungs demonstrate some compressive and dependent atelectatic change but are otherwise clear.  Visualized upper abdomen shows a few small calcifications in the liver consistent with old granulomatous disease. Imaged upper abdomen is otherwise unremarkable. Remote lower left rib fractures are noted. The patient has remote bilateral rib fractures. No lytic or sclerotic bony lesion is seen.  Review of the MIP images confirms the above findings.  IMPRESSION: Negative for pulmonary embolus.  Small to moderate bilateral pleural effusions with associated mild compressive atelectasis.  Cardiomegaly without CT evidence of pulmonary edema.  Calcific aortic and coronary atherosclerosis.   Electronically Signed   By: Inge Rise M.D.   On: 02/13/2014 18:30   Dg Chest Port 1 View  02/11/2014   CLINICAL DATA:  Respiratory distress.  Smoker.  Hypertension.  EXAM: PORTABLE CHEST - 1 VIEW  COMPARISON:  None.  FINDINGS: The heart size and mediastinal contours are within normal limits. Both lungs are clear. Pulmonary hyperinflation noted and suspicious for COPD. No evidence of pleural effusion or pneumothorax. The visualized skeletal structures are unremarkable.  IMPRESSION: Pulmonary hyperinflation, suspicious for COPD.  No acute findings.   Electronically Signed   By: Earle Gell M.D.   On: 02/11/2014 19:34     CBC  Recent Labs Lab 02/11/14 1930  02/12/14 1448 02/12/14 2205 02/13/14 0405 02/14/14 0243 02/15/14 0245  WBC 9.9  --   11.0* 10.6* 12.5* 8.8 6.0  HGB 13.7  < > 14.3 14.7 14.4 13.1 13.1  HCT 40.8  < > 41.3 42.0 42.1 38.7* 38.7*  PLT 181  --  173 168 174 140* 129*  MCV 99.0  --  96.5 98.6 99.8 100.3* 97.7  MCH 33.3  --  33.4 34.5* 34.1* 33.9 33.1  MCHC 33.6  --  34.6 35.0  34.2 33.9 33.9  RDW 12.9  --  12.9 12.9 13.1 13.0 12.7  LYMPHSABS 1.0  --   --   --   --   --   --   MONOABS 0.6  --   --   --   --   --   --   EOSABS 0.1  --   --   --   --   --   --   BASOSABS 0.0  --   --   --   --   --   --   < > = values in this interval not displayed.  Chemistries   Recent Labs Lab 02/11/14 1930 02/11/14 1939 02/12/14 2205 02/13/14 0405 02/14/14 0243 02/15/14 0245  [name] 131* 132*  --  130* 130* 128*  K 4.3 4.2  --  3.7 3.5 3.6  CL 97 98  --  96 97 96  CO2 23  --   --  25 28 26   GLUCOSE 221* 220*  --  145* 135* 103*  BUN 7 9  --  12 17 14   CREATININE 0.98 0.90 0.83 0.81 0.81 0.82  CALCIUM 8.9  --   --  8.8 8.4 8.3*  AST 33  --   --   --  23  --   ALT 24  --   --   --  19  --   ALKPHOS 52  --   --   --  38*  --   BILITOT 0.5  --   --   --  0.7  --    ------------------------------------------------------------------------------------------------------------------ estimated creatinine clearance is 76.7 mL/min (by C-G formula based on Cr of 0.82). ------------------------------------------------------------------------------------------------------------------ No results for input(s): HGBA1C in the last 72 hours. ------------------------------------------------------------------------------------------------------------------  Recent Labs  02/14/14 0243  CHOL 218*  HDL 50  LDLCALC 139*  TRIG 143  CHOLHDL 4.4   ------------------------------------------------------------------------------------------------------------------ No results for input(s): TSH, T4TOTAL, T3FREE, THYROIDAB in the last 72 hours.  Invalid input(s):  FREET3 ------------------------------------------------------------------------------------------------------------------ No results for input(s): VITAMINB12, FOLATE, FERRITIN, TIBC, IRON, RETICCTPCT in the last 72 hours.  Coagulation profile  Recent Labs Lab 02/14/14 0243  INR 1.10    No results for input(s): DDIMER in the last 72 hours.  Cardiac Enzymes  Recent Labs Lab 02/11/14 2207 02/12/14 0357 02/12/14 0927  TROPONINI 0.93* 1.27* 2.17*   ------------------------------------------------------------------------------------------------------------------ Invalid input(s): POCBNP     Time Spent in minutes  35   Joseline Mccampbell K M.D on 02/15/2014 at 9:39 AM  Between 7am to 7pm - Pager - [phone]  After 7pm go to [email or web address] - [facility] Office  [phone]

## 2014-02-15 NOTE — Progress Notes (Signed)
Patient ID: Caleb [name], male   DOB: Aug 31, 1950, 64 y.o.   MRN: [id number]      PROGRESS NOTE  Subjective:   Caleb [name] is a 64 yo with hx of COPD and CAD. Was admitted with unstable angina.  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 Denies any pain at this time   Objective:    Vital Signs:   Temp:  [97.3 F (36.3 C)-98.5 F (36.9 C)] 98.5 F (36.9 C) (02/01 0345) Pulse Rate:  [63-76] 66 (02/01 0345) Resp:  [13-22] 20 (02/01 0345) BP: (126-163)/(51-75) 163/74 mmHg (02/01 0624) SpO2:  [92 %-100 %] 95 % (02/01 0345) Weight:  [60.4 kg (133 lb 2.5 oz)] 60.4 kg (133 lb 2.5 oz) (02/01 0345)  Last BM Date: 02/12/14   24-hour weight change: Weight change: -2.3 kg (-5 lb 1.1 oz)  Weight trends: Filed Weights   02/13/14 0345 02/14/14 0500 02/15/14 0345  Weight: 64 kg (141 lb 1.5 oz) 62.7 kg (138 lb 3.7 oz) 60.4 kg (133 lb 2.5 oz)    Intake/Output:  01/31 0701 - 02/01 0700 In: 1250 [P.O.:1100; IV Piggyback:150] Out: 2925 [Urine:2925]     Physical Exam: BP 163/74 mmHg  Pulse 66  Temp(Src) 98.5 F (36.9 C) (Oral)  Resp 20  Ht 5\' 5"  (1.651 m)  Wt 60.4 kg (133 lb 2.5 oz)  BMI 22.16 kg/m2  SpO2 95%  Wt Readings from Last 3 Encounters:  02/15/14 60.4 kg (133 lb 2.5 oz)    General: Vital signs reviewed and noted.   Head: Normocephalic, atraumatic.  Eyes: conjunctivae/corneas clear.  EOM's intact.   Throat: normal  Neck:  normal   Lungs:    basilar rales , mmostly cleared with deep breaths  Heart:   RR, normal S1S2   Abdomen:  Soft, non-tender, non-distended    Extremities: Right radial cath site look ok    Neurologic: A&O X3, CN II - XII are grossly intact.   Psych: Normal     Labs: BMET:  Recent Labs  02/14/14 0243 02/15/14 0245  NA 130* 128*  K 3.5 3.6  CL 97 96  CO2 28 26  GLUCOSE 135* 103*  BUN 17 14  CREATININE 0.81 0.82  CALCIUM 8.4 8.3*    Liver function tests:  Recent Labs  02/14/14 0243  AST 23  ALT 19    ALKPHOS 38*  BILITOT 0.7  PROT 5.3*  ALBUMIN 2.9*   No results for input(s): LIPASE, AMYLASE in the last 72 hours.  CBC:  Recent Labs  02/14/14 0243 02/15/14 0245  WBC 8.8 6.0  HGB 13.1 13.1  HCT 38.7* 38.7*  MCV 100.3* 97.7  PLT 140* 129*    Cardiac Enzymes:  Recent Labs  02/12/14 0927  TROPONINI 2.17*    Coagulation Studies:  Recent Labs  02/14/14 0243  LABPROT 14.3  INR 1.10    Other: Invalid input(s): POCBNP No results for input(s): DDIMER in the last 72 hours. No results for input(s): HGBA1C in the last 72 hours.  Recent Labs  02/14/14 0243  CHOL 218*  HDL 50  LDLCALC 139*  TRIG 143  CHOLHDL 4.4     Other results:  EKG :    NSR , ST depression in the ant. Lat leads.     Medications:    Infusions: . sodium chloride 10 mL/hr at 02/13/14 0000    Scheduled Medications: . albuterol  2.5 mg Nebulization QID  . aspirin EC  325 mg  Oral Daily  . budesonide (PULMICORT) nebulizer solution  0.5 mg Nebulization BID  . carvedilol  25 mg Oral BID WC  . chlordiazePOXIDE  5 mg Oral TID  . docusate sodium  200 mg Oral BID  . enoxaparin (LOVENOX) injection  1 mg/kg Subcutaneous Q12H  . folic acid  1 mg Oral Daily  . furosemide  20 mg Intravenous Once  . guaiFENesin  600 mg Oral BID  . hydrALAZINE  25 mg Oral 3 times per day  . levofloxacin (LEVAQUIN) IV  750 mg Intravenous Q24H  . lisinopril  5 mg Oral Daily  . multivitamin with minerals  1 tablet Oral Daily  . polyethylene glycol  17 g Oral BID  . spironolactone  25 mg Oral Daily  . thiamine  100 mg Oral Daily  . tiotropium  18 mcg Inhalation Daily    I have reviewed the cardiac cath angiograms.   It shows a critical mid RCA stenosis and severe mid LAD disease , moderate LM disease   Assessment/ Plan:   1. CAD.   Severe 3VD  Positive troponin  Dr [name] seeing  Suspect he will have surgery latter this week Lungs improved  Carotids and PFT's pending for today  2. COPD - advised him to stop  smoking  Dr Gwenette Greet does not think "COPD" prohibitive of surgery PFTls pending   3. Essential HTN:  BP is fairly well controlled.   4. Cigarette smoker - advised cessation  Disposition:      Length of Stay: 4  [place]

## 2014-02-15 NOTE — Care Management Note (Signed)
    Page 1 of 1   02/15/2014     10:41:31 AM CARE MANAGEMENT NOTE 02/15/2014  Patient:  Caleb [name], Caleb [name]   Account Number:  [id number]  Date Initiated:  02/15/2014  Documentation initiated by:  Elissa Hefty  Subjective/Objective Assessment:   adm w resp failure     Action/Plan:   lives w fam, pcp dr Josph Macho wilson   Anticipated DC Date:     Anticipated DC Plan:  [place] offered to / List presented to:             Status of service:   Medicare Important Message given?  YES (If response is "NO", the following Medicare IM given date fields will be blank) Date Medicare IM given:  02/15/2014 Medicare IM given by:  Elissa Hefty Date Additional Medicare IM given:   Additional Medicare IM given by:    Discharge Disposition:    Per UR Regulation:  Reviewed for med. necessity/level of care/duration of stay  If discussed at [place] of Stay Meetings, dates discussed:   02/16/2014    Comments:

## 2014-02-15 NOTE — Progress Notes (Signed)
[facility]  S: Feeling well, denies chest pain O:  Filed Vitals:   02/15/14 0624 02/15/14 0752 02/15/14 0850 02/15/14 1223  BP: 163/74 144/71  119/58  Pulse:  72  67  Temp:  98.6 F (37 C)  98.2 F (36.8 C)  TempSrc:  Oral  Oral  Resp:  14    Height:      Weight:      SpO2:  98% 99% 95%   Gen: sitting up in chair, comfortable HEENT: NCAT, EOMi PULM: CTA B CV: RRR, no mgr AB: BS+, soft, nontender Ext: warm, no edema Neuro: A&Ox4, maew  Spirometry reviewed> only moderate small airway airway obstruction, not consistent with COPD  Impression: 1) Moderate small airways disease related to active smoking, but no COPD 2) CAD  Plan: OK to proceed with surgery from pulmonary standpoint  PCCM will be available prn  Roselie Awkward, MD [facility] Pager: [phone] Cell: [phone] If no response, call [phone]

## 2014-02-15 NOTE — Consult Note (Signed)
Caleb [name], PRIOLA NO.:  [id number]  MEDICAL RECORD NO.:  [id number]  LOCATION:  2H23C                        FACILITY:  [facility]  PHYSICIAN:  Kathee Delton, MD,FCCPDATE OF BIRTH:  1950/12/26  DATE OF CONSULTATION:  02/14/2014 DATE OF DISCHARGE:                                CONSULTATION   REFERRING PHYSICIAN:  Sinclair Grooms, MD.  HISTORY OF PRESENT ILLNESS:  The patient is a 64 year old male, who I have been asked to see for preop pulmonary evaluation prior to CABG. The patient has a long history of tobacco use with breathing issues, but has never had pulmonary function studies or formally diagnosed with COPD.  At baseline, he tells me that he went to bring groceries from the car, walking up 1 flight of stairs, but not doing slow walking over short distances.  He has an intermittent cough that is occasionally productive of foamy mucus.  He continues to smoke 1 pack of cigarettes a day.  He was recently admitted with worsening shortness of breath, chest congestion, and some cough with purulence.  Since that time, he has had a cardiac evaluation, which shows an ejection fraction of 30-35% and 3- vessel coronary disease, which is going to require CABG.  The patient has been treated with bronchodilators and feels that he is much improved from admission.  PAST MEDICAL HISTORY:  Significant for: 1. Hypertension. 2. Dyslipidemia.  ALLERGIES:  The patient has known drug allergies.  SOCIAL HISTORY:  He has been smoking all of his life anywhere from 1-2 packs per day.  He does drink alcohol, but denies recreational drug use.  FAMILY HISTORY:  Noncontributory to current issues.  REVIEW OF SYSTEMS:  A 10-point review of systems was unremarkable except for that listed in history of present illness.  PHYSICAL EXAMINATION:  GENERAL:  He is an overweight male in no acute distress. VITAL SIGNS:  Blood pressure is 135/72, pulse is 60 and regular, respiratory  rate 14.  He is afebrile.  O2 saturation on room air is 96%. HEENT:  Pupils equal, round, reactive to light and accommodation. Extraocular muscles are intact.  Nares are patent without discharge. Oropharynx is clear. NECK:  Supple without JVD or lymphadenopathy. CHEST:  Reveals totally clear breath sounds with no wheezes, and only very faint basilar crackles. CARDIAC:  Regular rate and rhythm. ABDOMEN:  Soft, nontender, nondistended with good bowel sounds. GENITAL/RECTAL/BREAST:  Not done and not indicated. EXTREMITIES:  Lower extremities are without edema.  No cyanosis. NEUROLOGIC:  He is alert and oriented, and moves all 4 extremities.  LABORATORY DATA:  Chest x-ray shows mild hyperinflation with no acute process.  IMPRESSION:  Acute asthmatic bronchitis related to ongoing smoking with airway inflammation versus a true chronic obstructive pulmonary disease exacerbation.  It is really unclear whether the patient even has chronic obstructive pulmonary disease, and will obviously need pulmonary function studies for further evaluation.  He is much improved since being in the hospital and treated aggressively.  He has been found to have a cardiomyopathy and 3-vessel coronary disease, and he is needing to undergo coronary artery bypass graft.  Without pulmonary function studies, it is  very difficult to risk stratify this patient for pulmonary risk postoperatively.  However, his lungs are totally clear today with no wheezing, and his oxygen saturations are 96% on room air. I suspect that he is not at significant risk for postop pulmonary complications at this time.  If the surgery is felt to be more urgent and the benefits outweigh the risks, then I would proceed with the planned surgery and will be happy to follow with you in the postoperative period.  If it is not of an emergent nature, perhaps we can get pulmonary function studies while he is in the hospital for further risk  stratification.  SUGGESTIONS: 1. Continue on an aggressive bronchodilator regimen while in the     hospital. 2. Schedule for PFTs either prior to his surgery or as an outpatient     after the patient's surgery. 3. I have encouraged the patient to consider total smoking cessation.     Kathee Delton, MD,FCCP     [facility]/MEDQ  D:  02/14/2014  T:  02/15/2014  Job:  [id number]

## 2014-02-15 NOTE — Progress Notes (Signed)
[address]       [place] [address]             [phone]     CARDIOTHORACIC SURGERY PROGRESS NOTE  3 Days Post-Op  S/P Procedure(s) (LRB): LEFT HEART CATHETERIZATION WITH CORONARY ANGIOGRAM (N/A)  Subjective: No chest pain.  Breathing improved.  Cough improved.  Objective: Vital signs in last 24 hours: Temp:  [97.3 F (36.3 C)-98.9 F (37.2 C)] 98.9 F (37.2 C) (02/01 1609) Pulse Rate:  [63-72] 68 (02/01 1609) Cardiac Rhythm:  [-] Normal sinus rhythm (02/01 0830) Resp:  [14-22] 14 (02/01 0752) BP: (116-163)/(51-75) 116/60 mmHg (02/01 1609) SpO2:  [92 %-99 %] 96 % (02/01 1609) Weight:  [60.4 kg (133 lb 2.5 oz)] 60.4 kg (133 lb 2.5 oz) (02/01 0345)  Physical Exam:  Rhythm:   sinus  Breath sounds: Bibasilar insp crackles  Heart sounds:  RRR  Incisions:  n/a  Abdomen:  soft  Extremities:  warm   Intake/Output from previous day: 01/31 0701 - 02/01 0700 In: 1250 [P.O.:1100; IV Piggyback:150] Out: 2925 [Urine:2925] Intake/Output this shift: Total I/O In: 120 [P.O.:120] Out: 1000 [Urine:1000]  Lab Results:  Recent Labs  02/14/14 0243 02/15/14 0245  WBC 8.8 6.0  HGB 13.1 13.1  HCT 38.7* 38.7*  PLT 140* 129*   BMET:  Recent Labs  02/14/14 0243 02/15/14 0245  NA 130* 128*  K 3.5 3.6  CL 97 96  CO2 28 26  GLUCOSE 135* 103*  BUN 17 14  CREATININE 0.81 0.82  CALCIUM 8.4 8.3*    CBG (last 3)  No results for input(s): GLUCAP in the last 72 hours. PT/INR:   Recent Labs  02/15/14 1105  LABPROT 13.5  INR 1.02    CT:   CT ANGIOGRAPHY CHEST WITH CONTRAST  TECHNIQUE: Multidetector CT imaging of the chest was performed using the standard protocol during bolus administration of intravenous contrast. Multiplanar CT image reconstructions and MIPs were obtained to evaluate the vascular anatomy.  CONTRAST: 80 mL OMNIPAQUE IOHEXOL 350 MG/ML SOLN  COMPARISON: Single view of the chest 02/11/2014.  FINDINGS: No pulmonary  embolus is identified. Small to moderate bilateral pleural effusions are seen. There is no pericardial effusion. Cardiomegaly is identified. There is calcific aortic and coronary atherosclerosis. No axillary, hilar or mediastinal lymphadenopathy is seen. The lungs demonstrate some compressive and dependent atelectatic change but are otherwise clear.  Visualized upper abdomen shows a few small calcifications in the liver consistent with old granulomatous disease. Imaged upper abdomen is otherwise unremarkable. Remote lower left rib fractures are noted. The patient has remote bilateral rib fractures. No lytic or sclerotic bony lesion is seen.  Review of the MIP images confirms the above findings.  IMPRESSION: Negative for pulmonary embolus.  Small to moderate bilateral pleural effusions with associated mild compressive atelectasis.  Cardiomegaly without CT evidence of pulmonary edema.  Calcific aortic and coronary atherosclerosis.   Electronically Signed  By: Inge Rise M.D.  On: 02/13/2014 18:30       Pulmonary Function Tests  Baseline      Post-bronchodilator  FVC  2.59 L  (68% predicted) FVC  2.67 L  (70% predicted) FEV1  1.95 L  (69% predicted) FEV1  2.01 L  (71% predicted) FEF25-75 1.44 L  (63% predicted) FEF25-75 1.68 L  (74% predicted)  TLC  Not done RV  Not done DLCO  Not done     Assessment/Plan:  The patient has severe three-vessel coronary artery disease  with likely chronic ischemic cardiomyopathy and recent acute myocardial infarction complicated by acute systolic congestive heart failure.  Symptoms have improved with diuretic therapy, The patient also has COPD with long-standing tobacco abuse and acute flare of productive cough consistent with bronchitis.   However, his cough has cleared rapidly with bronchodilators and antibiotics, and PFT's reveal only mild-moderate obstruction.  I agree that it's probably reasonable to proceed with  CABG in the near future.  I have reviewed the indications, risks, and potential benefits of coronary artery bypass grafting with the patient this evening.  Alternative treatment strategies have been discussed.  The patient understands and accepts all potential associated risks of surgery including but not limited to risk of death, stroke or other neurologic complication, myocardial infarction, congestive heart failure, respiratory failure, renal failure, bleeding requiring blood transfusion and/or reexploration, aortic dissection or other major vascular complication, arrhythmia, heart block or bradycardia requiring permanent pacemaker, pneumonia, pleural effusion, wound infection, pulmonary embolus or other thromboembolic complication, chronic pain or other delayed complications related to median sternotomy, or the late recurrence of symptomatic ischemic heart disease and/or congestive heart failure.  The importance of long term risk modification have been emphasized.  All questions answered.  We plan to proceed with surgery on Wednesday 02/17/2014.   I spent in excess of 30 minutes during the conduct of this hospital encounter and >50% of this time involved direct face-to-face encounter with the patient for counseling and/or coordination of their care.   Otto Felkins H 02/15/2014 6:28 PM

## 2014-02-16 ENCOUNTER — Encounter (HOSPITAL_COMMUNITY): Payer: BLUE CROSS/BLUE SHIELD

## 2014-02-16 DIAGNOSIS — Z01810 Encounter for preprocedural cardiovascular examination: Secondary | ICD-10-CM

## 2014-02-16 LAB — CBC
HCT: 39.8 % (ref 39.0–52.0)
Hemoglobin: 13.8 g/dL (ref 13.0–17.0)
MCH: 33.9 pg (ref 26.0–34.0)
MCHC: 34.7 g/dL (ref 30.0–36.0)
MCV: 97.8 fL (ref 78.0–100.0)
Platelets: 121 10*3/uL — ABNORMAL LOW (ref 150–400)
RBC: 4.07 MIL/uL — ABNORMAL LOW (ref 4.22–5.81)
RDW: 12.6 % (ref 11.5–15.5)
WBC: 5.6 10*3/uL (ref 4.0–10.5)

## 2014-02-16 LAB — POCT I-STAT 3, ART BLOOD GAS (G3+)
Acid-base deficit: 2 mmol/L (ref 0.0–2.0)
Bicarbonate: 21.5 mEq/L (ref 20.0–24.0)
O2 Saturation: 97 %
TCO2: 22 mmol/L (ref 0–100)
pCO2 arterial: 32.8 mmHg — ABNORMAL LOW (ref 35.0–45.0)
pH, Arterial: 7.424 (ref 7.350–7.450)
pO2, Arterial: 84 mmHg (ref 80.0–100.0)

## 2014-02-16 LAB — ABO/RH: ABO/RH(D): O POS

## 2014-02-16 LAB — TYPE AND SCREEN
ABO/RH(D): O POS
Antibody Screen: NEGATIVE

## 2014-02-16 LAB — BASIC METABOLIC PANEL
Anion gap: 6 (ref 5–15)
BUN: 13 mg/dL (ref 6–23)
CO2: 25 mmol/L (ref 19–32)
Calcium: 8.2 mg/dL — ABNORMAL LOW (ref 8.4–10.5)
Chloride: 98 mmol/L (ref 96–112)
Creatinine, Ser: 0.9 mg/dL (ref 0.50–1.35)
GFR calc Af Amer: >90 mL/min (ref >90)
GFR calc non Af Amer: 87 mL/min — ABNORMAL LOW (ref >90)
Glucose, Bld: 112 mg/dL — ABNORMAL HIGH (ref 70–99)
Potassium: 3.7 mmol/L (ref 3.5–5.1)
Sodium: 129 mmol/L — ABNORMAL LOW (ref 135–145)

## 2014-02-16 MED ORDER — PLASMA-LYTE 148 IV SOLN
INTRAVENOUS | Status: AC
  Administered 2014-02-17: 400 mL
  Filled 2014-02-16: qty 2.5

## 2014-02-16 MED ORDER — DEXTROSE 5 % IV SOLN
30.0000 ug/min | INTRAVENOUS | Status: AC
  Administered 2014-02-17: 15 ug/min via INTRAVENOUS
  Filled 2014-02-16: qty 2

## 2014-02-16 MED ORDER — MAGNESIUM SULFATE 50 % IJ SOLN
40.0000 meq | INTRAMUSCULAR | Status: DC
  Filled 2014-02-16: qty 10

## 2014-02-16 MED ORDER — DEXTROSE 5 % IV SOLN
1.5000 g | INTRAVENOUS | Status: AC
  Administered 2014-02-17: .75 g via INTRAVENOUS
  Administered 2014-02-17: 1.5 g via INTRAVENOUS
  Filled 2014-02-16 (×2): qty 1.5

## 2014-02-16 MED ORDER — METOPROLOL TARTRATE 12.5 MG HALF TABLET
12.5000 mg | ORAL_TABLET | Freq: Once | ORAL | Status: AC
  Administered 2014-02-17: 12.5 mg via ORAL
  Filled 2014-02-16: qty 1

## 2014-02-16 MED ORDER — VANCOMYCIN HCL 10 G IV SOLR
1250.0000 mg | INTRAVENOUS | Status: AC
  Administered 2014-02-17: 1250 mg via INTRAVENOUS
  Filled 2014-02-16 (×2): qty 1250

## 2014-02-16 MED ORDER — SODIUM CHLORIDE 0.9 % IV SOLN
INTRAVENOUS | Status: AC
  Administered 2014-02-17: 2 [IU]/h via INTRAVENOUS
  Filled 2014-02-16: qty 2.5

## 2014-02-16 MED ORDER — HEPARIN SODIUM (PORCINE) 1000 UNIT/ML IJ SOLN
INTRAMUSCULAR | Status: DC
  Filled 2014-02-16: qty 30

## 2014-02-16 MED ORDER — DEXTROSE 5 % IV SOLN
750.0000 mg | INTRAVENOUS | Status: DC
  Filled 2014-02-16 (×2): qty 750

## 2014-02-16 MED ORDER — CHLORHEXIDINE GLUCONATE 4 % EX LIQD
60.0000 mL | Freq: Once | CUTANEOUS | Status: AC
  Administered 2014-02-17: 4 via TOPICAL
  Filled 2014-02-16: qty 60

## 2014-02-16 MED ORDER — DOPAMINE-DEXTROSE 3.2-5 MG/ML-% IV SOLN
0.0000 ug/kg/min | INTRAVENOUS | Status: DC
  Filled 2014-02-16: qty 250

## 2014-02-16 MED ORDER — POTASSIUM CHLORIDE 2 MEQ/ML IV SOLN
80.0000 meq | INTRAVENOUS | Status: DC
  Filled 2014-02-16: qty 40

## 2014-02-16 MED ORDER — CHLORHEXIDINE GLUCONATE 4 % EX LIQD
60.0000 mL | Freq: Once | CUTANEOUS | Status: AC
  Administered 2014-02-16: 4 via TOPICAL
  Filled 2014-02-16: qty 60

## 2014-02-16 MED ORDER — TEMAZEPAM 15 MG PO CAPS
15.0000 mg | ORAL_CAPSULE | Freq: Once | ORAL | Status: AC | PRN
  Filled 2014-02-16: qty 1

## 2014-02-16 MED ORDER — SODIUM CHLORIDE 0.9 % IV SOLN
INTRAVENOUS | Status: AC
  Administered 2014-02-17: 69.7 mL/h via INTRAVENOUS
  Filled 2014-02-16: qty 40

## 2014-02-16 MED ORDER — EPINEPHRINE HCL 1 MG/ML IJ SOLN
0.0000 ug/min | INTRAVENOUS | Status: DC
  Filled 2014-02-16: qty 4

## 2014-02-16 MED ORDER — DEXMEDETOMIDINE HCL IN NACL 400 MCG/100ML IV SOLN
0.1000 ug/kg/h | INTRAVENOUS | Status: AC
  Administered 2014-02-17: 0.3 ug/kg/h via INTRAVENOUS
  Filled 2014-02-16: qty 100

## 2014-02-16 MED ORDER — VANCOMYCIN HCL 1000 MG IV SOLR
INTRAVENOUS | Status: AC
  Administered 2014-02-17: 1000 mL
  Filled 2014-02-16 (×2): qty 1000

## 2014-02-16 MED ORDER — NITROGLYCERIN IN D5W 200-5 MCG/ML-% IV SOLN
2.0000 ug/min | INTRAVENOUS | Status: AC
  Administered 2014-02-17: 16.61 ug/min via INTRAVENOUS
  Filled 2014-02-16: qty 250

## 2014-02-16 MED ORDER — BISACODYL 5 MG PO TBEC
5.0000 mg | DELAYED_RELEASE_TABLET | Freq: Once | ORAL | Status: AC
  Administered 2014-02-16: 5 mg via ORAL
  Filled 2014-02-16: qty 1

## 2014-02-16 NOTE — Progress Notes (Signed)
Patient Demographics  Caleb [name], is a 64 y.o. male, DOB - 1950-11-25, WFU:[id number]  Admit date - 02/11/2014   Admitting Physician Berle Mull, MD  Outpatient Primary MD for the patient is [name], MD  LOS - 5   Chief Complaint  Patient presents with  . Respiratory Distress      Brief summary  This is a 64 year old Caucasian gentleman with history of alcohol and smoking abuse who was admitted to the hospital for shortness of breath, this was initially thought to be COPD exacerbation however further workup revealed that  his shortness of breath was secondary to non-ST elevation MI. He was seen by cardiology, pulmonary, underwent left heart cath showing severe three-vessel disease, now seen by thoracic surgery. Due for open heart surgery on 02/17/2014. His pulmonary status is stable and he is oxygen free.    Subjective:   [name] today has, No headache, No chest pain, No abdominal pain - No Nausea, No new weakness tingling or numbness, No Cough -  Improved SOB.    Assessment & Plan    1. COPD without exacerbation - ABG not impressive, no wheezing on exam, stopped steroids , added Levaquin per [facility] request prior to CABG. Added Spiriva, Dulera and PRN Nebs, titrate off COPD, per [facility] PCCM input before CABG was requested and appreciated. No acute issues he is off oxygen. CABG likely to 02/17/2014. He is symptom-free.    2. NSTEMI Elevated troponin. This was the primary problem causing shortness of breath, cardiology on board, currently on full dose Lovenox along with aspirin, beta blocker and statin for secondary prevention. Left heart cath shows severe triple-vessel disease, consulted cardiothoracic surgery. He is currently pain-free. CABG likely to 02/17/2014. He is undergoing preop  vascular ultrasound.    3. Essential Hypertension - poor compliance with home medications, home medications resumed and added hydralazine for better control, stable blood pressure.    4. Ongoing smoking and Alcohol abuse. Counseled to quit both. No signs of DTs. On low-dose Librium. CIWA protocol as needed.    5. 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 Compensated.    6.Mild hyponatremia, Euvolemic  - per electrolytes likely SIADH, fluid restriction and gentle Lasix. Monitor BMP.    Code Status: Full  Family Communication: None present  Disposition Plan: Home   Procedures   TTE  - There is akinesis in the basal and mid inferoseptal, inferior, inferolateral walls, all of the apical sepgments and in the trueapex.This is suggestive of multivessel disease.Overall LVEF is severely impaired estimated at 30-35%.There is abnormal relaxation and normal size left atrium,suggestive that LV dysfunction is fairly recent.RV size and function is normal.   Vascular ultrasound pending.    Left heart cath 02-12-14 Dr Tamala Julian   IMPRESSIONS: 1. Severe three-vessel coronary disease with high-grade distal RCA, occlusion of the PDA, total occlusion of the proximal to mid circumflex, and high-grade obstruction in the proximal to mid LAD. The LAD supplies collaterals to the PDA and there are left to left collaterals to the circumflex.  2. Severe left ventricular systolic dysfunction with an EF of 25%, elevated LVEDP, and left ventricular  enlargement. Findings are compatible with an ischemic cardiomyopathy.   RECOMMENDATION: IV furosemide to establish diuresis.  Supplemental oxygen to keep O2 saturation greater than 90.  Transferred to the TCU/2H stepdown unit to be more closely observed.  The patient's coronary anatomy is surgical. He  will need to have surgical consultation (not called as case completed at 7:10P) to determine if he is indeed a surgical candidate. Comorbidities may exclude surgery as a treatment. The anatomy is not ideally suited for PCI given heavy calcification, multifocal lesions, and total occlusions of Cfx and PDA..    Consults Cardiology, [facility], pulmonary   Medications  Scheduled Meds: . albuterol  2.5 mg Nebulization QID  . aspirin EC  325 mg Oral Daily  . budesonide (PULMICORT) nebulizer solution  0.5 mg Nebulization BID  . carvedilol  25 mg Oral BID WC  . chlordiazePOXIDE  5 mg Oral TID  . docusate sodium  200 mg Oral BID  . enoxaparin (LOVENOX) injection  1 mg/kg Subcutaneous Q12H  . folic acid  1 mg Oral Daily  . furosemide  40 mg Intravenous Once  . guaiFENesin  600 mg Oral BID  . hydrALAZINE  25 mg Oral 3 times per day  . levofloxacin (LEVAQUIN) IV  750 mg Intravenous Q24H  . lisinopril  5 mg Oral Daily  . multivitamin with minerals  1 tablet Oral Daily  . polyethylene glycol  17 g Oral BID  . spironolactone  25 mg Oral Daily  . thiamine  100 mg Oral Daily  . tiotropium  18 mcg Inhalation Daily   Continuous Infusions: . sodium chloride 10 mL/hr at 02/13/14 0000   PRN Meds:.acetaminophen, ipratropium-albuterol, ondansetron (ZOFRAN) IV, oxyCODONE-acetaminophen  DVT Prophylaxis   Lovenox  Lab Results  Component Value Date   PLT 121* 02/16/2014    Antibiotics     Anti-infectives    Start     Dose/Rate Route Frequency Ordered Stop   02/14/14 1700  levofloxacin (LEVAQUIN) IVPB 750 mg     750 mg100 mL/hr over 90 Minutes Intravenous Every 24 hours 02/14/14 1635     02/13/14 1630  ceFEPIme (MAXIPIME) 1 g in dextrose 5 % 50 mL IVPB  Status:  Discontinued     1 g100 mL/hr over 30 Minutes Intravenous Every 8 hours 02/13/14 1603 02/14/14 0920   02/11/14 2130  cefTRIAXone (ROCEPHIN) 1 g in dextrose 5 % 50 mL IVPB  Status:  Discontinued     1 g100 mL/hr over 30 Minutes Intravenous  Every 24 hours 02/11/14 2128 02/12/14 1139   02/11/14 2130  azithromycin (ZITHROMAX) 500 mg in dextrose 5 % 250 mL IVPB  Status:  Discontinued     500 mg250 mL/hr over 60 Minutes Intravenous Every 24 hours 02/11/14 2128 02/13/14 1048          Objective:   Filed Vitals:   02/15/14 2320 02/16/14 0354 02/16/14 0621 02/16/14 0816  BP: 117/59 152/76 139/67 134/64  Pulse: 62 71  65  Temp: 98.1 F (36.7 C) 98.2 F (36.8 C)  98 F (36.7 C)  TempSrc: Oral Oral  Oral  Resp: 18 18    Height:      Weight:  61.689 kg (136 lb)    SpO2: 96% 99%  98%    Wt Readings from Last 3 Encounters:  02/16/14 61.689 kg (136 lb)     Intake/Output Summary (Last 24 hours) at 02/16/14 0857 Last data filed at 02/15/14 1858  Gross per 24 hour  Intake  520 ml  Output   1000 ml  Net   -480 ml     Physical Exam  Awake Alert, Oriented X 3, No new F.N deficits, Normal affect [place].AT,PERRAL Supple Neck,No JVD, No cervical lymphadenopathy appriciated.  Symmetrical Chest wall movement, Good air movement bilaterally, CTAB RRR,No Gallops,Rubs or new Murmurs, No Parasternal Heave +ve B.Sounds, Abd Soft, No tenderness, No organomegaly appriciated, No rebound - guarding or rigidity. No Cyanosis, Clubbing or edema, No new Rash or bruise      Data Review   Micro Results Recent Results (from the past 240 hour(s))  MRSA PCR Screening     Status: None   Collection Time: 02/11/14 10:41 PM  Result Value Ref Range Status   MRSA by PCR NEGATIVE NEGATIVE Final    Comment:        The GeneXpert MRSA Assay (FDA approved for NASAL specimens only), is one component of a comprehensive MRSA colonization surveillance program. It is not intended to diagnose MRSA infection nor to guide or monitor treatment for MRSA infections.   Surgical pcr screen     Status: None   Collection Time: 02/15/14  9:26 PM  Result Value Ref Range Status   MRSA, PCR NEGATIVE NEGATIVE Final   Staphylococcus aureus NEGATIVE NEGATIVE  Final    Comment:        The Xpert SA Assay (FDA approved for NASAL specimens in patients over 66 years of age), is one component of a comprehensive surveillance program.  Test performance has been validated by [facility] for patients greater than or equal to 31 year old. It is not intended to diagnose infection nor to guide or monitor treatment.     Radiology Reports Ct Angio Chest Pe W/cm &/or Wo Cm  02/13/2014   CLINICAL DATA:  Shortness of breath for 1 week.  EXAM: CT ANGIOGRAPHY CHEST WITH CONTRAST  TECHNIQUE: Multidetector CT imaging of the chest was performed using the standard protocol during bolus administration of intravenous contrast. Multiplanar CT image reconstructions and MIPs were obtained to evaluate the vascular anatomy.  CONTRAST:  80 mL OMNIPAQUE IOHEXOL 350 MG/ML SOLN  COMPARISON:  Single view of the chest 02/11/2014.  FINDINGS: No pulmonary embolus is identified. Small to moderate bilateral pleural effusions are seen. There is no pericardial effusion. Cardiomegaly is identified. There is calcific aortic and coronary atherosclerosis. No axillary, hilar or mediastinal lymphadenopathy is seen. The lungs demonstrate some compressive and dependent atelectatic change but are otherwise clear.  Visualized upper abdomen shows a few small calcifications in the liver consistent with old granulomatous disease. Imaged upper abdomen is otherwise unremarkable. Remote lower left rib fractures are noted. The patient has remote bilateral rib fractures. No lytic or sclerotic bony lesion is seen.  Review of the MIP images confirms the above findings.  IMPRESSION: Negative for pulmonary embolus.  Small to moderate bilateral pleural effusions with associated mild compressive atelectasis.  Cardiomegaly without CT evidence of pulmonary edema.  Calcific aortic and coronary atherosclerosis.   Electronically Signed   By: Inge Rise M.D.   On: 02/13/2014 18:30   Dg Chest Port 1 View  02/11/2014    CLINICAL DATA:  Respiratory distress.  Smoker.  Hypertension.  EXAM: PORTABLE CHEST - 1 VIEW  COMPARISON:  None.  FINDINGS: The heart size and mediastinal contours are within normal limits. Both lungs are clear. Pulmonary hyperinflation noted and suspicious for COPD. No evidence of pleural effusion or pneumothorax. The visualized skeletal structures are unremarkable.  IMPRESSION: Pulmonary hyperinflation, suspicious  for COPD.  No acute findings.   Electronically Signed   By: Earle Gell M.D.   On: 02/11/2014 19:34     CBC  Recent Labs Lab 02/11/14 1930  02/12/14 2205 02/13/14 0405 02/14/14 0243 02/15/14 0245 02/16/14 0240  WBC 9.9  < > 10.6* 12.5* 8.8 6.0 5.6  HGB 13.7  < > 14.7 14.4 13.1 13.1 13.8  HCT 40.8  < > 42.0 42.1 38.7* 38.7* 39.8  PLT 181  < > 168 174 140* 129* 121*  MCV 99.0  < > 98.6 99.8 100.3* 97.7 97.8  MCH 33.3  < > 34.5* 34.1* 33.9 33.1 33.9  MCHC 33.6  < > 35.0 34.2 33.9 33.9 34.7  RDW 12.9  < > 12.9 13.1 13.0 12.7 12.6  LYMPHSABS 1.0  --   --   --   --   --   --   MONOABS 0.6  --   --   --   --   --   --   EOSABS 0.1  --   --   --   --   --   --   BASOSABS 0.0  --   --   --   --   --   --   < > = values in this interval not displayed.  Chemistries   Recent Labs Lab 02/11/14 1930 02/11/14 1939 02/12/14 2205 02/13/14 0405 02/14/14 0243 02/15/14 0245 02/16/14 0240  NA 131* 132*  --  130* 130* 128* 129*  K 4.3 4.2  --  3.7 3.5 3.6 3.7  CL 97 98  --  96 97 96 98  CO2 23  --   --  25 28 26 25   GLUCOSE 221* 220*  --  145* 135* 103* 112*  BUN 7 9  --  12 17 14 13   CREATININE 0.98 0.90 0.83 0.81 0.81 0.82 0.90  CALCIUM 8.9  --   --  8.8 8.4 8.3* 8.2*  AST 33  --   --   --  23  --   --   ALT 24  --   --   --  19  --   --   ALKPHOS 52  --   --   --  38*  --   --   BILITOT 0.5  --   --   --  0.7  --   --    ------------------------------------------------------------------------------------------------------------------ estimated creatinine clearance is  71.2 mL/min (by C-G formula based on Cr of 0.9). ------------------------------------------------------------------------------------------------------------------  Recent Labs  02/14/14 0243  HGBA1C 5.2   ------------------------------------------------------------------------------------------------------------------  Recent Labs  02/14/14 0243  CHOL 218*  HDL 50  LDLCALC 139*  TRIG 143  CHOLHDL 4.4   ------------------------------------------------------------------------------------------------------------------ No results for input(s): TSH, T4TOTAL, T3FREE, THYROIDAB in the last 72 hours.  Invalid input(s): FREET3 ------------------------------------------------------------------------------------------------------------------ No results for input(s): VITAMINB12, FOLATE, FERRITIN, TIBC, IRON, RETICCTPCT in the last 72 hours.  Coagulation profile  Recent Labs Lab 02/14/14 0243 02/15/14 1105  INR 1.10 1.02    No results for input(s): DDIMER in the last 72 hours.  Cardiac Enzymes  Recent Labs Lab 02/11/14 2207 02/12/14 0357 02/12/14 0927  TROPONINI 0.93* 1.27* 2.17*   ------------------------------------------------------------------------------------------------------------------ Invalid input(s): POCBNP     Time Spent in minutes  35   Waunetta Riggle K M.D on 02/16/2014 at 8:57 AM  Between 7am to 7pm - Pager - [phone]  After 7pm go to [email or web address] - [facility] Office  [phone]

## 2014-02-16 NOTE — Progress Notes (Addendum)
VASCULAR LAB PRELIMINARY  PRELIMINARY  PRELIMINARY  PRELIMINARY  Pre-op Cardiac Surgery  Carotid Findings:  Bilateral:  1-39% ICA stenosis.  Vertebral artery flow is antegrade on the right. The left vertebral artery could not be visualized well enough to evaluate   Upper Extremity Right Left  Brachial Pressures 95  Triphasic  96  Triphasic   Radial Waveforms Triphasic  Triphasic   Ulnar Waveforms Triphasic  Triphasic   Palmar Arch (Allen's Test) PPG obliterates with both radial and ulnar compression.  Really.  Did it multiple times PPG obliterates with radial compression, normal with ulnar compression.   Findings:      Lower  Extremity Right Left  Dorsalis Pedis    Anterior Tibial Palpable   Posterior Tibial    Ankle/Brachial Indices      Findings:  Bilateral palpable pedal pulses   SLAUGHTER, [place], RVS 02/16/2014, 10:53 AM

## 2014-02-16 NOTE — Progress Notes (Signed)
Subjective:   Day of hospitalization: 5  64 YOM p/w cough and SOB.  EKG showed nonspecific ST-T wave changes with initial trop neg.  Repeat trop was elevated at 2/17.  TTE showed LVEF of 30-35%.  LHC showed severe 3V disease.     Objective:   Vital signs in last 24 hours: Filed Vitals:   02/15/14 2121 02/15/14 2320 02/16/14 0354 02/16/14 0621  BP: 125/55 117/59 152/76 139/67  Pulse:  62 71   Temp:  98.1 F (36.7 C) 98.2 F (36.8 C)   TempSrc:  Oral Oral   Resp:  18 18   Height:      Weight:   136 lb (61.689 kg)   SpO2:  96% 99%     Weight: Filed Weights   02/14/14 0500 02/15/14 0345 02/16/14 0354  Weight: 138 lb 3.7 oz (62.7 kg) 133 lb 2.5 oz (60.4 kg) 136 lb (61.689 kg)    I/Os:  Intake/Output Summary (Last 24 hours) at 02/16/14 1093 Last data filed at 02/15/14 1858  Gross per 24 hour  Intake    520 ml  Output   1000 ml  Net   -480 ml    Physical Exam: Constitutional: Vital signs reviewed.  Patient is sitting up/lying in bed in no acute distress and cooperative with exam.   HEENT: [place]/AT; PERRL, EOMI, conjunctivae normal/pale, no scleral icterus  Cardiovascular: RRR, no MRG Pulmonary/Chest: normal respiratory effort, no accessory muscle use, CTAB, no wheezes, rales, or rhonchi Abdominal: Soft. +BS, NT/ND Neurological: A&O x3, CN II-XII grossly intact; non-focal exam Extremities: 2+DP b/l, no C/C/E  Skin: Warm, dry and intact. No rash  Lab Results:  BMP:  Recent Labs Lab 02/15/14 0245 02/16/14 0240  NA 128* 129*  K 3.6 3.7  CL 96 98  CO2 26 25  GLUCOSE 103* 112*  BUN 14 13  CREATININE 0.82 0.90  CALCIUM 8.3* 8.2*    CBC:  Recent Labs Lab 02/11/14 1930  02/15/14 0245 02/16/14 0240  WBC 9.9  < > 6.0 5.6  NEUTROABS 8.2*  --   --   --   HGB 13.7  < > 13.1 13.8  HCT 40.8  < > 38.7* 39.8  MCV 99.0  < > 97.7 97.8  PLT 181  < > 129* 121*  < > = values in this interval not displayed.  Coagulation:  Recent Labs Lab 02/14/14 0243  02/15/14 1105  LABPROT 14.3 13.5  INR 1.10 1.02    CBG:           No results for input(s): GLUCAP in the last 168 hours.         HA1C:       Recent Labs Lab 02/14/14 0243  HGBA1C 5.2    Lipid Panel:  Recent Labs Lab 02/14/14 0243  CHOL 218*  HDL 50  LDLCALC 139*  TRIG 143  CHOLHDL 4.4    LFTs:  Recent Labs Lab 02/11/14 1930 02/14/14 0243  AST 33 23  ALT 24 19  ALKPHOS 52 38*  BILITOT 0.5 0.7  PROT 6.9 5.3*  ALBUMIN 3.6 2.9*    Pancreatic Enzymes: No results for input(s): LIPASE, AMYLASE in the last 168 hours.  Lactic Acid/Procalcitonin:  Recent Labs Lab 02/11/14 1940  LATICACIDVEN 3.05*    Ammonia: No results for input(s): AMMONIA in the last 168 hours.  Cardiac Enzymes:  Recent Labs Lab 02/11/14 2207 02/12/14 0357 02/12/14 0927  TROPONINI 0.93* 1.27* 2.17*    EKG: EKG Interpretation  Date/Time:  Thursday February 11 2014 19:14:08 EST Ventricular Rate:  98 PR Interval:  165 QRS Duration: 105 QT Interval:  381 QTC Calculation: 486 R Axis:   92 Text Interpretation:  Normal sinus rhythm Probable inferior infarct, recent Abnormal lateral Q waves Abnrm T, consider ischemia, anterolateral lds No old tracing to compare Confirmed by GOLDSTON  MD, SCOTT ([id number]) on 02/11/2014 8:01:00 PM   BNP: No results for input(s): PROBNP in the last 168 hours.  D-Dimer: No results for input(s): DDIMER in the last 168 hours.  Urinalysis:  Recent Labs Lab 02/13/14 1645  COLORURINE YELLOW  LABSPEC 1.025  PHURINE 6.5  GLUCOSEU NEGATIVE  HGBUR NEGATIVE  BILIRUBINUR NEGATIVE  KETONESUR NEGATIVE  PROTEINUR NEGATIVE  UROBILINOGEN 0.2  NITRITE NEGATIVE  LEUKOCYTESUR NEGATIVE    Micro Results: Recent Results (from the past 240 hour(s))  MRSA PCR Screening     Status: None   Collection Time: 02/11/14 10:41 PM  Result Value Ref Range Status   MRSA by PCR NEGATIVE NEGATIVE Final    Comment:        The GeneXpert MRSA Assay (FDA approved for  NASAL specimens only), is one component of a comprehensive MRSA colonization surveillance program. It is not intended to diagnose MRSA infection nor to guide or monitor treatment for MRSA infections.   Surgical pcr screen     Status: None   Collection Time: 02/15/14  9:26 PM  Result Value Ref Range Status   MRSA, PCR NEGATIVE NEGATIVE Final   Staphylococcus aureus NEGATIVE NEGATIVE Final    Comment:        The Xpert SA Assay (FDA approved for NASAL specimens in patients over 20 years of age), is one component of a comprehensive surveillance program.  Test performance has been validated by [facility] for patients greater than or equal to 20 year old. It is not intended to diagnose infection nor to guide or monitor treatment.     Blood Culture: No results found for: SDES, SPECREQUEST, CULT, REPTSTATUS  Studies/Results: No results found.  Medications:  Scheduled Meds: . albuterol  2.5 mg Nebulization QID  . aspirin EC  325 mg Oral Daily  . budesonide (PULMICORT) nebulizer solution  0.5 mg Nebulization BID  . carvedilol  25 mg Oral BID WC  . chlordiazePOXIDE  5 mg Oral TID  . docusate sodium  200 mg Oral BID  . enoxaparin (LOVENOX) injection  1 mg/kg Subcutaneous Q12H  . folic acid  1 mg Oral Daily  . furosemide  40 mg Intravenous Once  . guaiFENesin  600 mg Oral BID  . hydrALAZINE  25 mg Oral 3 times per day  . levofloxacin (LEVAQUIN) IV  750 mg Intravenous Q24H  . lisinopril  5 mg Oral Daily  . multivitamin with minerals  1 tablet Oral Daily  . polyethylene glycol  17 g Oral BID  . spironolactone  25 mg Oral Daily  . thiamine  100 mg Oral Daily  . tiotropium  18 mcg Inhalation Daily   Continuous Infusions: . sodium chloride 10 mL/hr at 02/13/14 0000   PRN Meds: acetaminophen, ipratropium-albuterol, ondansetron (ZOFRAN) IV, oxyCODONE-acetaminophen  Antibiotics: Antibiotics Given (last 72 hours)    Date/Time Action Medication Dose Rate   02/13/14 1643  Given   ceFEPIme (MAXIPIME) 1 g in dextrose 5 % 50 mL IVPB 1 g 100 mL/hr   02/14/14 0001 Given   ceFEPIme (MAXIPIME) 1 g in dextrose 5 % 50 mL IVPB 1 g 100 mL/hr   02/14/14 1723 Given  levofloxacin (LEVAQUIN) IVPB 750 mg 750 mg 100 mL/hr   02/15/14 1700 Given   levofloxacin (LEVAQUIN) IVPB 750 mg 750 mg 100 mL/hr      Day of Hospitalization: 5  Consults: Treatment Team:  Rounding Lbcardiology, MD Rexene Alberts, MD  Assessment/Plan:   1. CAD- Severe 3VD s/p LHC.  Carotids pending.  CABG planned for tomorrow per Dr. [name].   2. COPD- OK to proceed with surgery from pulmonology standpoint.   3. Essential HTN- BP controlled.   4. Cigarette smoker- Advised cessation    LOS: 5 days   Jones Bales, MD PGY-2, Internal Medicine Teaching Service 02/16/2014, 6:29 AM   Patient examined chart reviewed.  No chest pain  For CABG in am  Consider decreasing lasix given Hyponatremia  On aldactone as well  No murmur on exam  Jenkins Rouge

## 2014-02-17 ENCOUNTER — Inpatient Hospital Stay (HOSPITAL_COMMUNITY): Payer: BLUE CROSS/BLUE SHIELD | Admitting: Certified Registered Nurse Anesthetist

## 2014-02-17 ENCOUNTER — Inpatient Hospital Stay (HOSPITAL_COMMUNITY): Payer: BLUE CROSS/BLUE SHIELD

## 2014-02-17 ENCOUNTER — Encounter (HOSPITAL_COMMUNITY): Payer: Self-pay | Admitting: Thoracic Surgery (Cardiothoracic Vascular Surgery)

## 2014-02-17 ENCOUNTER — Encounter (HOSPITAL_COMMUNITY)
Admission: EM | Disposition: A | Discharge status: Home / Self Care | Payer: BLUE CROSS/BLUE SHIELD | Source: Home / Self Care | Attending: Internal Medicine

## 2014-02-17 DIAGNOSIS — Z951 Presence of aortocoronary bypass graft: Secondary | ICD-10-CM

## 2014-02-17 DIAGNOSIS — I5020 Unspecified systolic (congestive) heart failure: Secondary | ICD-10-CM

## 2014-02-17 DIAGNOSIS — I2510 Atherosclerotic heart disease of native coronary artery without angina pectoris: Secondary | ICD-10-CM

## 2014-02-17 HISTORY — PX: INTRAOPERATIVE TRANSESOPHAGEAL ECHOCARDIOGRAM: SHX5062

## 2014-02-17 HISTORY — DX: Presence of aortocoronary bypass graft: Z95.1

## 2014-02-17 HISTORY — PX: CORONARY ARTERY BYPASS GRAFT: SHX141

## 2014-02-17 LAB — POCT I-STAT, CHEM 8
BUN: 14 mg/dL (ref 6–23)
BUN: 12 mg/dL (ref 6–23)
BUN: 10 mg/dL (ref 6–23)
BUN: 12 mg/dL (ref 6–23)
BUN: 11 mg/dL (ref 6–23)
BUN: 10 mg/dL (ref 6–23)
BUN: 8 mg/dL (ref 6–23)
Calcium, Ion: 1.19 mmol/L (ref 1.13–1.30)
Calcium, Ion: 1.2 mmol/L (ref 1.13–1.30)
Calcium, Ion: 0.94 mmol/L — ABNORMAL LOW (ref 1.13–1.30)
Calcium, Ion: 1.2 mmol/L (ref 1.13–1.30)
Calcium, Ion: 1.02 mmol/L — ABNORMAL LOW (ref 1.13–1.30)
Calcium, Ion: 1 mmol/L — ABNORMAL LOW (ref 1.13–1.30)
Calcium, Ion: 1.25 mmol/L (ref 1.13–1.30)
Chloride: 96 mmol/L (ref 96–112)
Chloride: 96 mmol/L (ref 96–112)
Chloride: 91 mmol/L — ABNORMAL LOW (ref 96–112)
Chloride: 97 mmol/L (ref 96–112)
Chloride: 95 mmol/L — ABNORMAL LOW (ref 96–112)
Chloride: 93 mmol/L — ABNORMAL LOW (ref 96–112)
Chloride: 104 mmol/L (ref 96–112)
Creatinine, Ser: 0.6 mg/dL (ref 0.50–1.35)
Creatinine, Ser: 0.6 mg/dL (ref 0.50–1.35)
Creatinine, Ser: 0.4 mg/dL — ABNORMAL LOW (ref 0.50–1.35)
Creatinine, Ser: 0.6 mg/dL (ref 0.50–1.35)
Creatinine, Ser: 0.5 mg/dL (ref 0.50–1.35)
Creatinine, Ser: 0.6 mg/dL (ref 0.50–1.35)
Creatinine, Ser: 0.6 mg/dL (ref 0.50–1.35)
Glucose, Bld: 127 mg/dL — ABNORMAL HIGH (ref 70–99)
Glucose, Bld: 120 mg/dL — ABNORMAL HIGH (ref 70–99)
Glucose, Bld: 103 mg/dL — ABNORMAL HIGH (ref 70–99)
Glucose, Bld: 117 mg/dL — ABNORMAL HIGH (ref 70–99)
Glucose, Bld: 111 mg/dL — ABNORMAL HIGH (ref 70–99)
Glucose, Bld: 111 mg/dL — ABNORMAL HIGH (ref 70–99)
Glucose, Bld: 118 mg/dL — ABNORMAL HIGH (ref 70–99)
HCT: 39 % (ref 39.0–52.0)
HCT: 35 % — ABNORMAL LOW (ref 39.0–52.0)
HCT: 24 % — ABNORMAL LOW (ref 39.0–52.0)
HCT: 35 % — ABNORMAL LOW (ref 39.0–52.0)
HCT: 26 % — ABNORMAL LOW (ref 39.0–52.0)
HCT: 24 % — ABNORMAL LOW (ref 39.0–52.0)
HCT: 24 % — ABNORMAL LOW (ref 39.0–52.0)
Hemoglobin: 13.3 g/dL (ref 13.0–17.0)
Hemoglobin: 11.9 g/dL — ABNORMAL LOW (ref 13.0–17.0)
Hemoglobin: 11.9 g/dL — ABNORMAL LOW (ref 13.0–17.0)
Hemoglobin: 8.2 g/dL — ABNORMAL LOW (ref 13.0–17.0)
Hemoglobin: 8.8 g/dL — ABNORMAL LOW (ref 13.0–17.0)
Hemoglobin: 8.2 g/dL — ABNORMAL LOW (ref 13.0–17.0)
Hemoglobin: 8.2 g/dL — ABNORMAL LOW (ref 13.0–17.0)
Potassium: 3.7 mmol/L (ref 3.5–5.1)
Potassium: 3.9 mmol/L (ref 3.5–5.1)
Potassium: 3.7 mmol/L (ref 3.5–5.1)
Potassium: 3.8 mmol/L (ref 3.5–5.1)
Potassium: 4.5 mmol/L (ref 3.5–5.1)
Potassium: 4.3 mmol/L (ref 3.5–5.1)
Potassium: 4.4 mmol/L (ref 3.5–5.1)
Sodium: 129 mmol/L — ABNORMAL LOW (ref 135–145)
Sodium: 129 mmol/L — ABNORMAL LOW (ref 135–145)
Sodium: 130 mmol/L — ABNORMAL LOW (ref 135–145)
Sodium: 132 mmol/L — ABNORMAL LOW (ref 135–145)
Sodium: 130 mmol/L — ABNORMAL LOW (ref 135–145)
Sodium: 129 mmol/L — ABNORMAL LOW (ref 135–145)
Sodium: 133 mmol/L — ABNORMAL LOW (ref 135–145)
TCO2: 22 mmol/L (ref 0–100)
TCO2: 23 mmol/L (ref 0–100)
TCO2: 22 mmol/L (ref 0–100)
TCO2: 24 mmol/L (ref 0–100)
TCO2: 22 mmol/L (ref 0–100)
TCO2: 20 mmol/L (ref 0–100)
TCO2: 17 mmol/L (ref 0–100)

## 2014-02-17 LAB — POCT I-STAT 3, ART BLOOD GAS (G3+)
Acid-Base Excess: 1 mmol/L (ref 0.0–2.0)
Acid-base deficit: 1 mmol/L (ref 0.0–2.0)
Acid-base deficit: 4 mmol/L — ABNORMAL HIGH (ref 0.0–2.0)
Acid-base deficit: 6 mmol/L — ABNORMAL HIGH (ref 0.0–2.0)
Acid-base deficit: 4 mmol/L — ABNORMAL HIGH (ref 0.0–2.0)
Bicarbonate: 24.3 mEq/L — ABNORMAL HIGH (ref 20.0–24.0)
Bicarbonate: 25.8 mEq/L — ABNORMAL HIGH (ref 20.0–24.0)
Bicarbonate: 24.1 mEq/L — ABNORMAL HIGH (ref 20.0–24.0)
Bicarbonate: 21.3 mEq/L (ref 20.0–24.0)
Bicarbonate: 19.7 mEq/L — ABNORMAL LOW (ref 20.0–24.0)
Bicarbonate: 20.3 mEq/L (ref 20.0–24.0)
O2 Saturation: 100 %
O2 Saturation: 100 %
O2 Saturation: 99 %
O2 Saturation: 98 %
O2 Saturation: 99 %
O2 Saturation: 95 %
Patient temperature: 35.7
Patient temperature: 36.6
Patient temperature: 36.7
TCO2: 25 mmol/L (ref 0–100)
TCO2: 27 mmol/L (ref 0–100)
TCO2: 25 mmol/L (ref 0–100)
TCO2: 22 mmol/L (ref 0–100)
TCO2: 21 mmol/L (ref 0–100)
TCO2: 21 mmol/L (ref 0–100)
pCO2 arterial: 36.7 mmHg (ref 35.0–45.0)
pCO2 arterial: 39.9 mmHg (ref 35.0–45.0)
pCO2 arterial: 42.4 mmHg (ref 35.0–45.0)
pCO2 arterial: 38.1 mmHg (ref 35.0–45.0)
pCO2 arterial: 38.1 mmHg (ref 35.0–45.0)
pCO2 arterial: 34.4 mmHg — ABNORMAL LOW (ref 35.0–45.0)
pH, Arterial: 7.43 (ref 7.350–7.450)
pH, Arterial: 7.419 (ref 7.350–7.450)
pH, Arterial: 7.362 (ref 7.350–7.450)
pH, Arterial: 7.348 — ABNORMAL LOW (ref 7.350–7.450)
pH, Arterial: 7.321 — ABNORMAL LOW (ref 7.350–7.450)
pH, Arterial: 7.378 (ref 7.350–7.450)
pO2, Arterial: 404 mmHg — ABNORMAL HIGH (ref 80.0–100.0)
pO2, Arterial: 416 mmHg — ABNORMAL HIGH (ref 80.0–100.0)
pO2, Arterial: 138 mmHg — ABNORMAL HIGH (ref 80.0–100.0)
pO2, Arterial: 115 mmHg — ABNORMAL HIGH (ref 80.0–100.0)
pO2, Arterial: 124 mmHg — ABNORMAL HIGH (ref 80.0–100.0)
pO2, Arterial: 76 mmHg — ABNORMAL LOW (ref 80.0–100.0)

## 2014-02-17 LAB — CBC
HCT: 40.2 % (ref 39.0–52.0)
HCT: 27.3 % — ABNORMAL LOW (ref 39.0–52.0)
HCT: 25 % — ABNORMAL LOW (ref 39.0–52.0)
Hemoglobin: 14.1 g/dL (ref 13.0–17.0)
Hemoglobin: 9.5 g/dL — ABNORMAL LOW (ref 13.0–17.0)
Hemoglobin: 8.5 g/dL — ABNORMAL LOW (ref 13.0–17.0)
MCH: 33.7 pg (ref 26.0–34.0)
MCH: 33.3 pg (ref 26.0–34.0)
MCH: 33.1 pg (ref 26.0–34.0)
MCHC: 35.1 g/dL (ref 30.0–36.0)
MCHC: 34.8 g/dL (ref 30.0–36.0)
MCHC: 34 g/dL (ref 30.0–36.0)
MCV: 96.2 fL (ref 78.0–100.0)
MCV: 95.8 fL (ref 78.0–100.0)
MCV: 97.3 fL (ref 78.0–100.0)
Platelets: 124 10*3/uL — ABNORMAL LOW (ref 150–400)
Platelets: 87 10*3/uL — ABNORMAL LOW (ref 150–400)
Platelets: 76 10*3/uL — ABNORMAL LOW (ref 150–400)
RBC: 4.18 MIL/uL — ABNORMAL LOW (ref 4.22–5.81)
RBC: 2.85 MIL/uL — ABNORMAL LOW (ref 4.22–5.81)
RBC: 2.57 MIL/uL — ABNORMAL LOW (ref 4.22–5.81)
RDW: 12.6 % (ref 11.5–15.5)
RDW: 12.6 % (ref 11.5–15.5)
RDW: 12.9 % (ref 11.5–15.5)
WBC: 8.8 10*3/uL (ref 4.0–10.5)
WBC: 7.9 10*3/uL (ref 4.0–10.5)
WBC: 7.8 10*3/uL (ref 4.0–10.5)

## 2014-02-17 LAB — HEMOGLOBIN AND HEMATOCRIT, BLOOD
HCT: 23.9 % — ABNORMAL LOW (ref 39.0–52.0)
Hemoglobin: 8.5 g/dL — ABNORMAL LOW (ref 13.0–17.0)

## 2014-02-17 LAB — GLUCOSE, CAPILLARY
Glucose-Capillary: 108 mg/dL — ABNORMAL HIGH (ref 70–99)
Glucose-Capillary: 113 mg/dL — ABNORMAL HIGH (ref 70–99)
Glucose-Capillary: 115 mg/dL — ABNORMAL HIGH (ref 70–99)
Glucose-Capillary: 119 mg/dL — ABNORMAL HIGH (ref 70–99)
Glucose-Capillary: 164 mg/dL — ABNORMAL HIGH (ref 70–99)
Glucose-Capillary: 69 mg/dL — ABNORMAL LOW (ref 70–99)
Glucose-Capillary: 93 mg/dL (ref 70–99)
Glucose-Capillary: 97 mg/dL (ref 70–99)

## 2014-02-17 LAB — BASIC METABOLIC PANEL
Anion gap: 9 (ref 5–15)
BUN: 16 mg/dL (ref 6–23)
CO2: 22 mmol/L (ref 19–32)
Calcium: 8.5 mg/dL (ref 8.4–10.5)
Chloride: 96 mmol/L (ref 96–112)
Creatinine, Ser: 0.88 mg/dL (ref 0.50–1.35)
GFR calc Af Amer: >90 mL/min (ref >90)
GFR calc non Af Amer: 88 mL/min — ABNORMAL LOW (ref >90)
Glucose, Bld: 113 mg/dL — ABNORMAL HIGH (ref 70–99)
Potassium: 3.6 mmol/L (ref 3.5–5.1)
Sodium: 127 mmol/L — ABNORMAL LOW (ref 135–145)

## 2014-02-17 LAB — MAGNESIUM: Magnesium: 3.3 mg/dL — ABNORMAL HIGH (ref 1.5–2.5)

## 2014-02-17 LAB — PROTIME-INR
INR: 1.31 (ref 0.00–1.49)
Prothrombin Time: 16.4 seconds — ABNORMAL HIGH (ref 11.6–15.2)

## 2014-02-17 LAB — POCT I-STAT 4, (NA,K, GLUC, HGB,HCT)
Glucose, Bld: 98 mg/dL (ref 70–99)
HCT: 28 % — ABNORMAL LOW (ref 39.0–52.0)
Hemoglobin: 9.5 g/dL — ABNORMAL LOW (ref 13.0–17.0)
Potassium: 4.1 mmol/L (ref 3.5–5.1)
Sodium: 132 mmol/L — ABNORMAL LOW (ref 135–145)

## 2014-02-17 LAB — APTT: aPTT: 35 seconds (ref 24–37)

## 2014-02-17 LAB — PLATELET COUNT: Platelets: 83 10*3/uL — ABNORMAL LOW (ref 150–400)

## 2014-02-17 LAB — CREATININE, SERUM
Creatinine, Ser: 0.73 mg/dL (ref 0.50–1.35)
GFR calc Af Amer: >90 mL/min (ref >90)
GFR calc non Af Amer: >90 mL/min (ref >90)

## 2014-02-17 SURGERY — CORONARY ARTERY BYPASS GRAFTING (CABG)
Anesthesia: General | Site: Chest

## 2014-02-17 MED ORDER — MORPHINE SULFATE 2 MG/ML IJ SOLN: 1.0000 mg | INTRAMUSCULAR | Status: DC | PRN

## 2014-02-17 MED ORDER — CEFUROXIME SODIUM 1.5 G IJ SOLR
1.5000 g | Freq: Two times a day (BID) | INTRAMUSCULAR | Status: AC
  Administered 2014-02-17 – 2014-02-19 (×4): 1.5 g via INTRAVENOUS
  Filled 2014-02-17 (×5): qty 1.5

## 2014-02-17 MED ORDER — BISACODYL 5 MG PO TBEC
10.0000 mg | DELAYED_RELEASE_TABLET | Freq: Every day | ORAL | Status: DC
  Administered 2014-02-18 – 2014-02-20 (×3): 10 mg via ORAL
  Filled 2014-02-17 (×3): qty 2

## 2014-02-17 MED ORDER — NITROGLYCERIN IN D5W 200-5 MCG/ML-% IV SOLN: 0.0000 ug/min | INTRAVENOUS | Status: DC

## 2014-02-17 MED ORDER — LACTATED RINGERS IV SOLN: 500.0000 mL | Freq: Once | INTRAVENOUS | Status: AC | PRN

## 2014-02-17 MED ORDER — PROPOFOL 10 MG/ML IV BOLUS
INTRAVENOUS | Status: DC | PRN
  Administered 2014-02-17: 50 mg via INTRAVENOUS

## 2014-02-17 MED ORDER — TRAMADOL HCL 50 MG PO TABS
50.0000 mg | ORAL_TABLET | ORAL | Status: DC | PRN
  Administered 2014-02-18: 100 mg via ORAL
  Filled 2014-02-17: qty 2

## 2014-02-17 MED ORDER — LIDOCAINE HCL (CARDIAC) 20 MG/ML IV SOLN
INTRAVENOUS | Status: DC | PRN
  Administered 2014-02-17: 100 mg via INTRAVENOUS

## 2014-02-17 MED ORDER — FENTANYL CITRATE 0.05 MG/ML IJ SOLN
INTRAMUSCULAR | Status: AC
  Filled 2014-02-17: qty 5

## 2014-02-17 MED ORDER — PROTAMINE SULFATE 10 MG/ML IV SOLN
INTRAVENOUS | Status: AC
  Filled 2014-02-17: qty 25

## 2014-02-17 MED ORDER — DEXTROSE 50 % IV SOLN
12.0000 mL | Freq: Once | INTRAVENOUS | Status: AC
  Administered 2014-02-17: 12 mL via INTRAVENOUS
  Filled 2014-02-17: qty 50

## 2014-02-17 MED ORDER — DIPHENHYDRAMINE HCL 50 MG/ML IJ SOLN
INTRAMUSCULAR | Status: DC | PRN
  Administered 2014-02-17: 12.5 mg via INTRAVENOUS

## 2014-02-17 MED ORDER — SODIUM CHLORIDE 0.45 % IV SOLN
INTRAVENOUS | Status: DC
  Administered 2014-02-17: 15:00:00 via INTRAVENOUS

## 2014-02-17 MED ORDER — PROTAMINE SULFATE 10 MG/ML IV SOLN
INTRAVENOUS | Status: DC | PRN
Start: 2014-02-17 — End: 2014-02-17
  Administered 2014-02-17: 100 mg via INTRAVENOUS

## 2014-02-17 MED ORDER — NITROGLYCERIN 0.2 MG/ML ON CALL CATH LAB
INTRAVENOUS | Status: DC | PRN
  Administered 2014-02-17: 20 ug via INTRAVENOUS

## 2014-02-17 MED ORDER — FENTANYL CITRATE 0.05 MG/ML IJ SOLN
INTRAMUSCULAR | Status: DC | PRN
  Administered 2014-02-17: 200 ug via INTRAVENOUS
  Administered 2014-02-17: 50 ug via INTRAVENOUS
  Administered 2014-02-17: 1000 ug via INTRAVENOUS
  Administered 2014-02-17: 150 ug via INTRAVENOUS
  Administered 2014-02-17: 100 ug via INTRAVENOUS
  Administered 2014-02-17: 250 ug via INTRAVENOUS

## 2014-02-17 MED ORDER — SUCCINYLCHOLINE CHLORIDE 20 MG/ML IJ SOLN
INTRAMUSCULAR | Status: AC
  Filled 2014-02-17: qty 1

## 2014-02-17 MED ORDER — SODIUM CHLORIDE 0.9 % IV SOLN
INTRAVENOUS | Status: AC
  Administered 2014-02-17: 15:00:00 via INTRAVENOUS

## 2014-02-17 MED ORDER — ROCURONIUM BROMIDE 50 MG/5ML IV SOLN
INTRAVENOUS | Status: AC
  Filled 2014-02-17: qty 2

## 2014-02-17 MED ORDER — MIDAZOLAM HCL 2 MG/2ML IJ SOLN
INTRAMUSCULAR | Status: AC
  Filled 2014-02-17: qty 2

## 2014-02-17 MED ORDER — SODIUM CHLORIDE 0.9 % IV SOLN
INTRAVENOUS | Status: DC
  Administered 2014-02-17: 15:00:00 via INTRAVENOUS

## 2014-02-17 MED ORDER — LACTATED RINGERS IV SOLN
INTRAVENOUS | Status: DC
  Administered 2014-02-17: 17:00:00 via INTRAVENOUS

## 2014-02-17 MED ORDER — ONDANSETRON HCL 4 MG/2ML IJ SOLN: 4.0000 mg | Freq: Four times a day (QID) | INTRAMUSCULAR | Status: DC | PRN

## 2014-02-17 MED ORDER — ARTIFICIAL TEARS OP OINT
TOPICAL_OINTMENT | OPHTHALMIC | Status: AC
  Filled 2014-02-17: qty 3.5

## 2014-02-17 MED ORDER — INSULIN REGULAR BOLUS VIA INFUSION
0.0000 [IU] | Freq: Three times a day (TID) | INTRAVENOUS | Status: DC
  Filled 2014-02-17: qty 10

## 2014-02-17 MED ORDER — PROPOFOL 10 MG/ML IV BOLUS
INTRAVENOUS | Status: AC
  Filled 2014-02-17: qty 20

## 2014-02-17 MED ORDER — MIDAZOLAM HCL 5 MG/5ML IJ SOLN
INTRAMUSCULAR | Status: DC | PRN
  Administered 2014-02-17: 1 mg via INTRAVENOUS
  Administered 2014-02-17: 5 mg via INTRAVENOUS
  Administered 2014-02-17 (×3): 2 mg via INTRAVENOUS

## 2014-02-17 MED ORDER — LACTATED RINGERS IV SOLN
INTRAVENOUS | Status: DC | PRN
  Administered 2014-02-17 (×2): via INTRAVENOUS

## 2014-02-17 MED ORDER — PANTOPRAZOLE SODIUM 40 MG PO TBEC
40.0000 mg | DELAYED_RELEASE_TABLET | Freq: Every day | ORAL | Status: DC
  Administered 2014-02-19 – 2014-02-20 (×2): 40 mg via ORAL
  Filled 2014-02-17 (×2): qty 1

## 2014-02-17 MED ORDER — HEMOSTATIC AGENTS (NO CHARGE) OPTIME
TOPICAL | Status: DC | PRN
  Administered 2014-02-17: 3 via TOPICAL

## 2014-02-17 MED ORDER — METOPROLOL TARTRATE 25 MG/10 ML ORAL SUSPENSION
12.5000 mg | Freq: Two times a day (BID) | ORAL | Status: DC
  Filled 2014-02-17 (×3): qty 5

## 2014-02-17 MED ORDER — INSULIN ASPART 100 UNIT/ML ~~LOC~~ SOLN
0.0000 [IU] | SUBCUTANEOUS | Status: DC
  Administered 2014-02-18 (×2): 4 [IU] via SUBCUTANEOUS

## 2014-02-17 MED ORDER — OXYCODONE HCL 5 MG PO TABS: 5.0000 mg | ORAL_TABLET | ORAL | Status: DC | PRN

## 2014-02-17 MED ORDER — HEPARIN SODIUM (PORCINE) 1000 UNIT/ML IJ SOLN
INTRAMUSCULAR | Status: DC | PRN
  Administered 2014-02-17: 17000 [IU] via INTRAVENOUS
  Administered 2014-02-17: 3000 [IU] via INTRAVENOUS

## 2014-02-17 MED ORDER — SODIUM CHLORIDE 0.9 % IV SOLN: 250.0000 mL | INTRAVENOUS | Status: DC

## 2014-02-17 MED ORDER — ASPIRIN 81 MG PO CHEW: 324.0000 mg | CHEWABLE_TABLET | Freq: Every day | ORAL | Status: DC

## 2014-02-17 MED ORDER — DEXTROSE 50 % IV SOLN
INTRAVENOUS | Status: AC
  Administered 2014-02-17: 17:00:00
  Filled 2014-02-17: qty 50

## 2014-02-17 MED ORDER — ALBUMIN HUMAN 5 % IV SOLN
INTRAVENOUS | Status: DC | PRN
  Administered 2014-02-17 (×3): via INTRAVENOUS

## 2014-02-17 MED ORDER — ASPIRIN EC 325 MG PO TBEC
325.0000 mg | DELAYED_RELEASE_TABLET | Freq: Every day | ORAL | Status: DC
  Administered 2014-02-18 – 2014-02-20 (×3): 325 mg via ORAL
  Filled 2014-02-17 (×3): qty 1

## 2014-02-17 MED ORDER — LACTATED RINGERS IV SOLN
INTRAVENOUS | Status: DC | PRN
  Administered 2014-02-17: 08:00:00 via INTRAVENOUS

## 2014-02-17 MED ORDER — VANCOMYCIN HCL IN DEXTROSE 1-5 GM/200ML-% IV SOLN
1000.0000 mg | Freq: Once | INTRAVENOUS | Status: AC
  Administered 2014-02-17: 1000 mg via INTRAVENOUS
  Filled 2014-02-17: qty 200

## 2014-02-17 MED ORDER — FAMOTIDINE IN NACL 20-0.9 MG/50ML-% IV SOLN
20.0000 mg | Freq: Two times a day (BID) | INTRAVENOUS | Status: AC
  Administered 2014-02-17 – 2014-02-18 (×2): 20 mg via INTRAVENOUS
  Filled 2014-02-17: qty 50

## 2014-02-17 MED ORDER — METOPROLOL TARTRATE 12.5 MG HALF TABLET
12.5000 mg | ORAL_TABLET | Freq: Two times a day (BID) | ORAL | Status: DC
  Administered 2014-02-18: 12.5 mg via ORAL
  Filled 2014-02-17 (×5): qty 1

## 2014-02-17 MED ORDER — ACETAMINOPHEN 160 MG/5ML PO SOLN: 1000.0000 mg | Freq: Four times a day (QID) | ORAL | Status: DC

## 2014-02-17 MED ORDER — ROCURONIUM BROMIDE 100 MG/10ML IV SOLN
INTRAVENOUS | Status: DC | PRN
  Administered 2014-02-17: 50 mg via INTRAVENOUS
  Administered 2014-02-17 (×2): 20 mg via INTRAVENOUS
  Administered 2014-02-17: 30 mg via INTRAVENOUS

## 2014-02-17 MED ORDER — LIDOCAINE HCL (CARDIAC) 20 MG/ML IV SOLN
INTRAVENOUS | Status: AC
  Filled 2014-02-17: qty 5

## 2014-02-17 MED ORDER — POTASSIUM CHLORIDE 10 MEQ/50ML IV SOLN: 10.0000 meq | INTRAVENOUS | Status: AC

## 2014-02-17 MED ORDER — ACETAMINOPHEN 160 MG/5ML PO SOLN
650.0000 mg | Freq: Once | ORAL | Status: AC
Start: 2014-02-17 — End: 2014-02-17

## 2014-02-17 MED ORDER — METOPROLOL TARTRATE 1 MG/ML IV SOLN: 2.5000 mg | INTRAVENOUS | Status: DC | PRN

## 2014-02-17 MED ORDER — ACETAMINOPHEN 500 MG PO TABS
1000.0000 mg | ORAL_TABLET | Freq: Four times a day (QID) | ORAL | Status: DC
  Administered 2014-02-18 – 2014-02-20 (×9): 1000 mg via ORAL
  Filled 2014-02-17 (×14): qty 2

## 2014-02-17 MED ORDER — SODIUM CHLORIDE 0.9 % IJ SOLN
3.0000 mL | Freq: Two times a day (BID) | INTRAMUSCULAR | Status: DC
  Administered 2014-02-18 – 2014-02-19 (×4): 3 mL via INTRAVENOUS

## 2014-02-17 MED ORDER — ALBUMIN HUMAN 5 % IV SOLN
250.0000 mL | INTRAVENOUS | Status: AC | PRN
  Administered 2014-02-17 (×3): 250 mL via INTRAVENOUS
  Filled 2014-02-17: qty 250

## 2014-02-17 MED ORDER — PHENYLEPHRINE HCL 10 MG/ML IJ SOLN: 0.0000 ug/min | INTRAVENOUS | Status: DC

## 2014-02-17 MED ORDER — ALBUTEROL SULFATE (2.5 MG/3ML) 0.083% IN NEBU: 2.5000 mg | INHALATION_SOLUTION | RESPIRATORY_TRACT | Status: DC | PRN

## 2014-02-17 MED ORDER — CHLORHEXIDINE GLUCONATE 0.12 % MT SOLN
OROMUCOSAL | Status: AC
  Administered 2014-02-17: 15 mL
  Filled 2014-02-17: qty 15

## 2014-02-17 MED ORDER — LACTATED RINGERS IV SOLN
INTRAVENOUS | Status: DC | PRN
  Administered 2014-02-17 (×2): via INTRAVENOUS

## 2014-02-17 MED ORDER — CALCIUM CHLORIDE 10 % IV SOLN
INTRAVENOUS | Status: DC | PRN
  Administered 2014-02-17 (×3): 300 mg via INTRAVENOUS

## 2014-02-17 MED ORDER — MIDAZOLAM HCL 2 MG/2ML IJ SOLN: 2.0000 mg | INTRAMUSCULAR | Status: DC | PRN

## 2014-02-17 MED ORDER — MORPHINE SULFATE 2 MG/ML IJ SOLN: 2.0000 mg | INTRAMUSCULAR | Status: DC | PRN

## 2014-02-17 MED ORDER — 0.9 % SODIUM CHLORIDE (POUR BTL) OPTIME
TOPICAL | Status: DC | PRN
  Administered 2014-02-17: 2000 mL

## 2014-02-17 MED ORDER — DEXMEDETOMIDINE HCL IN NACL 200 MCG/50ML IV SOLN
0.0000 ug/kg/h | INTRAVENOUS | Status: DC
  Administered 2014-02-17: 0.2 ug/kg/h via INTRAVENOUS
  Filled 2014-02-17: qty 50

## 2014-02-17 MED ORDER — CHLORHEXIDINE GLUCONATE 0.12 % MT SOLN
15.0000 mL | Freq: Two times a day (BID) | OROMUCOSAL | Status: DC
  Administered 2014-02-17 – 2014-02-19 (×4): 15 mL via OROMUCOSAL
  Filled 2014-02-17 (×8): qty 15

## 2014-02-17 MED ORDER — MAGNESIUM SULFATE 4 GM/100ML IV SOLN
4.0000 g | Freq: Once | INTRAVENOUS | Status: AC
  Administered 2014-02-17: 4 g via INTRAVENOUS
  Filled 2014-02-17: qty 100

## 2014-02-17 MED ORDER — SODIUM CHLORIDE 0.9 % IJ SOLN
INTRAMUSCULAR | Status: AC
  Filled 2014-02-17: qty 20

## 2014-02-17 MED ORDER — ACETAMINOPHEN 650 MG RE SUPP
650.0000 mg | Freq: Once | RECTAL | Status: AC
  Administered 2014-02-17: 650 mg via RECTAL

## 2014-02-17 MED ORDER — SODIUM CHLORIDE 0.9 % IV SOLN: INTRAVENOUS | Status: DC

## 2014-02-17 MED ORDER — SODIUM CHLORIDE 0.9 % IJ SOLN: 3.0000 mL | INTRAMUSCULAR | Status: DC | PRN

## 2014-02-17 MED ORDER — BISACODYL 10 MG RE SUPP: 10.0000 mg | Freq: Every day | RECTAL | Status: DC

## 2014-02-17 MED ORDER — DOCUSATE SODIUM 100 MG PO CAPS
200.0000 mg | ORAL_CAPSULE | Freq: Every day | ORAL | Status: DC
  Administered 2014-02-18 – 2014-02-20 (×3): 200 mg via ORAL
  Filled 2014-02-17 (×3): qty 2

## 2014-02-17 MED ORDER — MIDAZOLAM HCL 10 MG/2ML IJ SOLN
INTRAMUSCULAR | Status: AC
  Filled 2014-02-17: qty 2

## 2014-02-17 MED ORDER — CETYLPYRIDINIUM CHLORIDE 0.05 % MT LIQD
7.0000 mL | Freq: Four times a day (QID) | OROMUCOSAL | Status: DC
  Administered 2014-02-18 (×2): 7 mL via OROMUCOSAL

## 2014-02-17 SURGICAL SUPPLY — 127 items
ADAPTER CARDIO PERF ANTE/RETRO (ADAPTER) ×1 IMPLANT
ADPR PRFSN 84XANTGRD RTRGD (ADAPTER) ×2
APL SKNCLS STERI-STRIP NONHPOA (GAUZE/BANDAGES/DRESSINGS) ×2
APPLIER CLIP 9.375 MED OPEN (MISCELLANEOUS)
APPLIER CLIP 9.375 SM OPEN (CLIP)
APR CLP MED 9.3 20 MLT OPN (MISCELLANEOUS)
APR CLP SM 9.3 20 MLT OPN (CLIP)
ATTRACTOMAT 16X20 MAGNETIC DRP (DRAPES) ×3 IMPLANT
BAG DECANTER FOR FLEXI CONT (MISCELLANEOUS) ×3 IMPLANT
BANDAGE ELASTIC 4 VELCRO ST LF (GAUZE/BANDAGES/DRESSINGS) ×3 IMPLANT
BANDAGE ELASTIC 6 VELCRO ST LF (GAUZE/BANDAGES/DRESSINGS) ×3 IMPLANT
BASKET HEART (ORDER IN 25'S) (MISCELLANEOUS) ×1
BASKET HEART (ORDER IN 25S) (MISCELLANEOUS) ×2 IMPLANT
BENZOIN TINCTURE PRP APPL 2/3 (GAUZE/BANDAGES/DRESSINGS) ×3 IMPLANT
BLADE STERNUM SYSTEM 6 (BLADE) ×3 IMPLANT
BLADE SURG ROTATE 9660 (MISCELLANEOUS) IMPLANT
BNDG GAUZE ELAST 4 BULKY (GAUZE/BANDAGES/DRESSINGS) ×3 IMPLANT
CANISTER SUCTION 2500CC (MISCELLANEOUS) ×3 IMPLANT
CANNULA EZ GLIDE AORTIC 21FR (CANNULA) ×6 IMPLANT
CANNULA GUNDRY RCSP 15FR (MISCELLANEOUS) IMPLANT
CANNULA VENOUS LOW PROF 34X46 (CANNULA) ×3 IMPLANT
CARDIAC SUCTION (MISCELLANEOUS) ×3 IMPLANT
CATH CPB KIT OWEN (MISCELLANEOUS) ×3 IMPLANT
CATH THORACIC 28FR (CATHETERS) IMPLANT
CATH THORACIC 28FR RT ANG (CATHETERS) IMPLANT
CATH THORACIC 36FR (CATHETERS) ×3 IMPLANT
CATH THORACIC 36FR RT ANG (CATHETERS) ×3 IMPLANT
CLIP APPLIE 9.375 MED OPEN (MISCELLANEOUS) IMPLANT
CLIP APPLIE 9.375 SM OPEN (CLIP) IMPLANT
CLIP FOGARTY SPRING 6M (CLIP) IMPLANT
CLIP TI MEDIUM 24 (CLIP) IMPLANT
CLIP TI WIDE RED SMALL 24 (CLIP) IMPLANT
CONN ST 1/4X3/8  BEN (MISCELLANEOUS) ×1
CONN ST 1/4X3/8 BEN (MISCELLANEOUS) IMPLANT
CONN Y 3/8X3/8X3/8  BEN (MISCELLANEOUS) ×1
CONN Y 3/8X3/8X3/8 BEN (MISCELLANEOUS) IMPLANT
COVER SURGICAL LIGHT HANDLE (MISCELLANEOUS) ×3 IMPLANT
CRADLE DONUT ADULT HEAD (MISCELLANEOUS) ×3 IMPLANT
DRAIN CHANNEL 32F RND 10.7 FF (WOUND CARE) ×3 IMPLANT
DRAPE CARDIOVASCULAR INCISE (DRAPES) ×3
DRAPE INCISE IOBAN 66X45 STRL (DRAPES) ×3 IMPLANT
DRAPE SLUSH/WARMER DISC (DRAPES) ×3 IMPLANT
DRAPE SRG 135X102X78XABS (DRAPES) ×2 IMPLANT
DRSG COVADERM 4X10 (GAUZE/BANDAGES/DRESSINGS) ×1 IMPLANT
DRSG COVADERM 4X14 (GAUZE/BANDAGES/DRESSINGS) ×3 IMPLANT
ELECT BLADE 4.0 EZ CLEAN MEGAD (MISCELLANEOUS) ×3
ELECT REM PT RETURN 9FT ADLT (ELECTROSURGICAL) ×6
ELECTRODE BLDE 4.0 EZ CLN MEGD (MISCELLANEOUS) IMPLANT
ELECTRODE REM PT RTRN 9FT ADLT (ELECTROSURGICAL) ×4 IMPLANT
GAUZE SPONGE 4X4 12PLY STRL (GAUZE/BANDAGES/DRESSINGS) ×6 IMPLANT
GLOVE BIO SURGEON STRL SZ 6 (GLOVE) IMPLANT
GLOVE BIO SURGEON STRL SZ 6.5 (GLOVE) IMPLANT
GLOVE BIO SURGEON STRL SZ7 (GLOVE) IMPLANT
GLOVE BIO SURGEON STRL SZ7.5 (GLOVE) IMPLANT
GLOVE BIOGEL PI IND STRL 6 (GLOVE) IMPLANT
GLOVE BIOGEL PI IND STRL 6.5 (GLOVE) IMPLANT
GLOVE BIOGEL PI IND STRL 7.0 (GLOVE) IMPLANT
GLOVE BIOGEL PI INDICATOR 6 (GLOVE)
GLOVE BIOGEL PI INDICATOR 6.5 (GLOVE)
GLOVE BIOGEL PI INDICATOR 7.0 (GLOVE)
GLOVE EUDERMIC 7 POWDERFREE (GLOVE) IMPLANT
GLOVE ORTHO TXT STRL SZ7.5 (GLOVE) ×6 IMPLANT
GOWN STRL REUS W/ TWL LRG LVL3 (GOWN DISPOSABLE) ×8 IMPLANT
GOWN STRL REUS W/TWL LRG LVL3 (GOWN DISPOSABLE) ×12
HEMOSTAT POWDER SURGIFOAM 1G (HEMOSTASIS) ×9 IMPLANT
INSERT FOGARTY 61MM (MISCELLANEOUS) IMPLANT
INSERT FOGARTY XLG (MISCELLANEOUS) ×3 IMPLANT
KIT BASIN OR (CUSTOM PROCEDURE TRAY) ×3 IMPLANT
KIT ROOM TURNOVER OR (KITS) ×3 IMPLANT
KIT SUCTION CATH 14FR (SUCTIONS) ×15 IMPLANT
KIT VASOVIEW W/TROCAR VH 2000 (KITS) ×3 IMPLANT
LEAD PACING MYOCARDI (MISCELLANEOUS) ×3 IMPLANT
LIQUID BAND (GAUZE/BANDAGES/DRESSINGS) ×1 IMPLANT
MARKER GRAFT CORONARY BYPASS (MISCELLANEOUS) ×9 IMPLANT
NS IRRIG 1000ML POUR BTL (IV SOLUTION) ×15 IMPLANT
PACK OPEN HEART (CUSTOM PROCEDURE TRAY) ×3 IMPLANT
PAD ARMBOARD 7.5X6 YLW CONV (MISCELLANEOUS) ×3 IMPLANT
PAD ELECT DEFIB RADIOL ZOLL (MISCELLANEOUS) ×3 IMPLANT
PENCIL BUTTON HOLSTER BLD 10FT (ELECTRODE) ×3 IMPLANT
PUNCH AORTIC ROTATE 4.0MM (MISCELLANEOUS) ×1 IMPLANT
PUNCH AORTIC ROTATE 4.5MM 8IN (MISCELLANEOUS) IMPLANT
PUNCH AORTIC ROTATE 5MM 8IN (MISCELLANEOUS) IMPLANT
SET CARDIOPLEGIA MPS 5001102 (MISCELLANEOUS) ×1 IMPLANT
SOLUTION ANTI FOG 6CC (MISCELLANEOUS) IMPLANT
SPONGE GAUZE 4X4 12PLY STER LF (GAUZE/BANDAGES/DRESSINGS) ×2 IMPLANT
SPONGE LAP 18X18 X RAY DECT (DISPOSABLE) IMPLANT
SPONGE LAP 4X18 X RAY DECT (DISPOSABLE) IMPLANT
SUT BONE WAX W31G (SUTURE) ×3 IMPLANT
SUT ETHIBOND X763 2 0 SH 1 (SUTURE) ×6 IMPLANT
SUT MNCRL AB 3-0 PS2 18 (SUTURE) ×6 IMPLANT
SUT MNCRL AB 4-0 PS2 18 (SUTURE) IMPLANT
SUT PDS AB 1 CTX 36 (SUTURE) ×6 IMPLANT
SUT PROLENE 2 0 SH DA (SUTURE) IMPLANT
SUT PROLENE 3 0 SH DA (SUTURE) ×3 IMPLANT
SUT PROLENE 3 0 SH1 36 (SUTURE) IMPLANT
SUT PROLENE 4 0 RB 1 (SUTURE)
SUT PROLENE 4 0 SH DA (SUTURE) ×1 IMPLANT
SUT PROLENE 4-0 RB1 .5 CRCL 36 (SUTURE) IMPLANT
SUT PROLENE 5 0 C 1 36 (SUTURE) IMPLANT
SUT PROLENE 6 0 C 1 30 (SUTURE) ×2 IMPLANT
SUT PROLENE 7.0 RB 3 (SUTURE) ×9 IMPLANT
SUT PROLENE 8 0 BV175 6 (SUTURE) ×2 IMPLANT
SUT PROLENE BLUE 7 0 (SUTURE) ×3 IMPLANT
SUT PROLENE POLY MONO (SUTURE) IMPLANT
SUT SILK  1 MH (SUTURE) ×2
SUT SILK 1 MH (SUTURE) ×2 IMPLANT
SUT STEEL 6MS V (SUTURE) IMPLANT
SUT STEEL STERNAL CCS#1 18IN (SUTURE) IMPLANT
SUT STEEL SZ 6 DBL 3X14 BALL (SUTURE) IMPLANT
SUT VIC AB 1 CTX 36 (SUTURE)
SUT VIC AB 1 CTX36XBRD ANBCTR (SUTURE) IMPLANT
SUT VIC AB 2-0 CT1 27 (SUTURE) ×3
SUT VIC AB 2-0 CT1 TAPERPNT 27 (SUTURE) IMPLANT
SUT VIC AB 2-0 CTX 27 (SUTURE) IMPLANT
SUT VIC AB 3-0 SH 27 (SUTURE)
SUT VIC AB 3-0 SH 27X BRD (SUTURE) IMPLANT
SUT VIC AB 3-0 X1 27 (SUTURE) ×1 IMPLANT
SUT VICRYL 4-0 PS2 18IN ABS (SUTURE) IMPLANT
SUTURE E-PAK OPEN HEART (SUTURE) ×3 IMPLANT
SYSTEM SAHARA CHEST DRAIN ATS (WOUND CARE) ×3 IMPLANT
TAPE CLOTH SURG 4X10 WHT LF (GAUZE/BANDAGES/DRESSINGS) ×1 IMPLANT
TOWEL OR 17X24 6PK STRL BLUE (TOWEL DISPOSABLE) ×6 IMPLANT
TOWEL OR 17X26 10 PK STRL BLUE (TOWEL DISPOSABLE) ×6 IMPLANT
TRAY FOLEY IC TEMP SENS 16FR (CATHETERS) ×3 IMPLANT
TUBING INSUFFLATION (TUBING) ×3 IMPLANT
UNDERPAD 30X30 INCONTINENT (UNDERPADS AND DIAPERS) ×3 IMPLANT
WATER STERILE IRR 1000ML POUR (IV SOLUTION) ×6 IMPLANT

## 2014-02-17 NOTE — Progress Notes (Signed)
  Echocardiogram Echocardiogram Transesophageal has been performed.  Darlina Sicilian M 02/17/2014, 9:26 AM

## 2014-02-17 NOTE — Brief Op Note (Addendum)
02/11/2014 - 02/17/2014      [address]       [place] [address]             [phone]     02/11/2014 - 02/17/2014  12:06 PM  PATIENT:  Caleb [name]  64 y.o. male  PRE-OPERATIVE DIAGNOSIS:  CAD  POST-OPERATIVE DIAGNOSIS:  CAD  PROCEDURE:  Procedure(s): CORONARY ARTERY BYPASS GRAFTING (CABG)X3 LIMA-LAD; RIMA-RCA; SVG-OM1 INTRAOPERATIVE TRANSESOPHAGEAL ECHOCARDIOGRAM [facility] RIGHT THIGH  SURGEON:    Rexene Alberts, MD  ASSISTANTS:  Jadene Pierini, PA-C and Lemar Lofty, PA-S  ANESTHESIA:   Lillia Abed, MD  CROSSCLAMP TIME:   66'  CARDIOPULMONARY BYPASS TIME: 40'  FINDINGS:  Mild global LV systolic dysfunction, EF 56%  Good quality LIMA and RIMA conduit for grafting  Good quality SVG conduit for grafting  Diffuse CAD with fair target vessels for grafting  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

## 2014-02-17 NOTE — Anesthesia Procedure Notes (Addendum)
Anesthesia Procedure Note PA catheter:  Routine monitors. Timeout, sterile prep, drape, FBP R neck.  Trendelenburg position.  1% [place] local, finder and trocar RIJ 1st pass with US guidance.  Cordis placed over J wire. PA catheter in easily.  Sterile dressing applied.  Patient tolerated well, VSS.  C Jackson, MD  07:55-08:06 Procedure Name: Intubation Date/Time: 02/17/2014 8:50 AM Performed by: FLORES, ROBERT Pre-anesthesia Checklist: Patient identified, Emergency Drugs available, Suction available and Patient being monitored Patient Re-evaluated:Patient Re-evaluated prior to inductionOxygen Delivery Method: Circle system utilized Preoxygenation: Pre-oxygenation with 100% oxygen Intubation Type: IV induction Ventilation: Mask ventilation without difficulty and Oral airway inserted - appropriate to patient size Laryngoscope Size: Miller and 3 Grade View: Grade I Tube type: Oral Tube size: 7.5 mm Number of attempts: 1 Airway Equipment and Method: Stylet and Oral airway Placement Confirmation: ETT inserted through vocal cords under direct vision,  breath sounds checked- equal and bilateral,  positive ETCO2 and CO2 detector Secured at: 22 cm Tube secured with: Tape Dental Injury: Teeth and Oropharynx as per pre-operative assessment      

## 2014-02-17 NOTE — Anesthesia Postprocedure Evaluation (Signed)
Anesthesia Post Note  Patient: Caleb [name]  Procedure(s) Performed: Procedure(s) (LRB): CORONARY ARTERY BYPASS GRAFTING (CABG) (N/A) INTRAOPERATIVE TRANSESOPHAGEAL ECHOCARDIOGRAM (N/A)  Anesthesia type: General  Patient location: ICU  Post pain: Pain level controlled  Post assessment: Post-op Vital signs reviewed  Last Vitals:  Filed Vitals:   02/17/14 1515  BP:   Pulse: 86  Temp: 35.4 C  Resp: 12    Post vital signs: stable  Level of consciousness: Patient remains intubated per anesthesia plan  Complications: No apparent anesthesia complications

## 2014-02-17 NOTE — Anesthesia Preprocedure Evaluation (Signed)
Anesthesia Evaluation  Patient identified by MRN, date of birth, ID band Patient awake    Reviewed: Allergy & Precautions, NPO status , Patient's Chart, lab work & pertinent test results, reviewed documented beta blocker date and time   Airway Mallampati: II  TM Distance: >3 FB Neck ROM: Full    Dental  (+) Lower Dentures, Upper Dentures   Pulmonary neg shortness of breath, neg sleep apnea, COPDCurrent Smoker,  breath sounds clear to auscultation        Cardiovascular Exercise Tolerance: Poor METS: 3 - Mets hypertension, Pt. on medications and Pt. on home beta blockers + angina at rest and with exertion + CAD, +CHF and + DOE Rhythm:Regular Rate:Normal     Neuro/Psych Neuropathy in feet negative neurological ROS  negative psych ROS   GI/Hepatic negative GI ROS, (+)     substance abuse  alcohol use, 6 beers/day   Endo/Other  neg diabetes  Renal/GU negative Renal ROS     Musculoskeletal negative musculoskeletal ROS (+)   Abdominal Normal abdominal exam  (+)   Peds  Hematology negative hematology ROS (+)   Anesthesia Other Findings   Reproductive/Obstetrics negative OB ROS                             Anesthesia Physical Anesthesia Plan  ASA: IV  Anesthesia Plan: General   Post-op Pain Management:    Induction: Intravenous  Airway Management Planned: Oral ETT  Additional Equipment: Arterial line, CVP, PA Cath and TEE  Intra-op Plan:   Post-operative Plan: Post-operative intubation/ventilation  Informed Consent: I have reviewed the patients History and Physical, chart, labs and discussed the procedure including the risks, benefits and alternatives for the proposed anesthesia with the patient or authorized representative who has indicated his/her understanding and acceptance.     Plan Discussed with:   Anesthesia Plan Comments:         Anesthesia Quick Evaluation

## 2014-02-17 NOTE — Progress Notes (Signed)
Patient extubated at 1830 and put on 4L [place]. Following commands, performing IS and coughing and deep breathing. Patient is A&O, states that he isn't in any pain. Will continue to monitor. Richardean Sale, RN

## 2014-02-17 NOTE — Progress Notes (Signed)
Subjective:   Day of hospitalization: 6  63 YOM p/w cough and SOB.  LHC revealed multivessel disease--going for CABG today.  Denies CP.     Objective:   Vital signs in last 24 hours: Filed Vitals:   02/16/14 2308 02/17/14 0000 02/17/14 0500 02/17/14 0538  BP: 154/61 127/47 138/64 138/62  Pulse: 60 59 62 67  Temp: 98.1 F (36.7 C)  98.2 F (36.8 C)   TempSrc: Oral  Oral   Resp: 16  20   Height:      Weight:      SpO2: 99% 96% 96%     Weight: Filed Weights   02/14/14 0500 02/15/14 0345 02/16/14 0354  Weight: 62.7 kg (138 lb 3.7 oz) 60.4 kg (133 lb 2.5 oz) 61.689 kg (136 lb)    I/Os:  Intake/Output Summary (Last 24 hours) at 02/17/14 0635 Last data filed at 02/16/14 1653  Gross per 24 hour  Intake   1110 ml  Output      0 ml  Net   1110 ml    Physical Exam: Constitutional: Vital signs reviewed.  Patient is in no acute distress and cooperative with exam.   HEENT: [place]/AT; PERRL, EOMI, conjunctivae normal, no scleral icterus  Cardiovascular: RRR, no MRG Pulmonary/Chest: normal respiratory effort, no accessory muscle use, CTAB, no wheezes, rales, or rhonchi Abdominal: Soft. +BS, NT/ND Neurological: A&O x3, CN II-XII grossly intact; non-focal exam Extremities: 2+DP b/l, no C/C/E  Skin: Warm, dry and intact. No rash  Lab Results:  BMP:  Recent Labs Lab 02/16/14 0240 02/17/14 0300  NA 129* 127*  K 3.7 3.6  CL 98 96  CO2 25 22  GLUCOSE 112* 113*  BUN 13 16  CREATININE 0.90 0.88  CALCIUM 8.2* 8.5    CBC:  Recent Labs Lab 02/11/14 1930  02/16/14 0240 02/17/14 0300  WBC 9.9  < > 5.6 8.8  NEUTROABS 8.2*  --   --   --   HGB 13.7  < > 13.8 14.1  HCT 40.8  < > 39.8 40.2  MCV 99.0  < > 97.8 96.2  PLT 181  < > 121* 124*  < > = values in this interval not displayed.  Coagulation:  Recent Labs Lab 02/14/14 0243 02/15/14 1105  LABPROT 14.3 13.5  INR 1.10 1.02    CBG:           No results for input(s): GLUCAP in the last 168  hours.         HA1C:       Recent Labs Lab 02/14/14 0243  HGBA1C 5.2    Lipid Panel:  Recent Labs Lab 02/14/14 0243  CHOL 218*  HDL 50  LDLCALC 139*  TRIG 143  CHOLHDL 4.4    LFTs:  Recent Labs Lab 02/11/14 1930 02/14/14 0243  AST 33 23  ALT 24 19  ALKPHOS 52 38*  BILITOT 0.5 0.7  PROT 6.9 5.3*  ALBUMIN 3.6 2.9*    Pancreatic Enzymes: No results for input(s): LIPASE, AMYLASE in the last 168 hours.  Lactic Acid/Procalcitonin:  Recent Labs Lab 02/11/14 1940  LATICACIDVEN 3.05*    Ammonia: No results for input(s): AMMONIA in the last 168 hours.  Cardiac Enzymes:  Recent Labs Lab 02/11/14 2207 02/12/14 0357 02/12/14 0927  TROPONINI 0.93* 1.27* 2.17*    EKG: EKG Interpretation  Date/Time:  Thursday February 11 2014 19:14:08 EST Ventricular Rate:  98 PR Interval:  165 QRS Duration: 105 QT Interval:  381 QTC Calculation: 486  R Axis:   92 Text Interpretation:  Normal sinus rhythm Probable inferior infarct, recent Abnormal lateral Q waves Abnrm T, consider ischemia, anterolateral lds No old tracing to compare Confirmed by GOLDSTON  MD, SCOTT ([id number]) on 02/11/2014 8:01:00 PM   BNP: No results for input(s): PROBNP in the last 168 hours.  D-Dimer: No results for input(s): DDIMER in the last 168 hours.  Urinalysis:  Recent Labs Lab 02/13/14 1645  COLORURINE YELLOW  LABSPEC 1.025  PHURINE 6.5  GLUCOSEU NEGATIVE  HGBUR NEGATIVE  BILIRUBINUR NEGATIVE  KETONESUR NEGATIVE  PROTEINUR NEGATIVE  UROBILINOGEN 0.2  NITRITE NEGATIVE  LEUKOCYTESUR NEGATIVE    Micro Results: Recent Results (from the past 240 hour(s))  MRSA PCR Screening     Status: None   Collection Time: 02/11/14 10:41 PM  Result Value Ref Range Status   MRSA by PCR NEGATIVE NEGATIVE Final    Comment:        The GeneXpert MRSA Assay (FDA approved for NASAL specimens only), is one component of a comprehensive MRSA colonization surveillance program. It is not intended to  diagnose MRSA infection nor to guide or monitor treatment for MRSA infections.   Surgical pcr screen     Status: None   Collection Time: 02/15/14  9:26 PM  Result Value Ref Range Status   MRSA, PCR NEGATIVE NEGATIVE Final   Staphylococcus aureus NEGATIVE NEGATIVE Final    Comment:        The Xpert SA Assay (FDA approved for NASAL specimens in patients over 81 years of age), is one component of a comprehensive surveillance program.  Test performance has been validated by [facility] for patients greater than or equal to 41 year old. It is not intended to diagnose infection nor to guide or monitor treatment.     Blood Culture: No results found for: SDES, SPECREQUEST, CULT, REPTSTATUS  Studies/Results: No results found.  Medications:  Scheduled Meds: . albuterol  2.5 mg Nebulization QID  . aminocaproic acid (AMICAR) for OHS   Intravenous To OR  . aspirin EC  325 mg Oral Daily  . budesonide (PULMICORT) nebulizer solution  0.5 mg Nebulization BID  . carvedilol  25 mg Oral BID WC  . cefUROXime (ZINACEF)  IV  1.5 g Intravenous To OR  . cefUROXime (ZINACEF)  IV  750 mg Intravenous To OR  . chlordiazePOXIDE  5 mg Oral TID  . dexmedetomidine  0.1-0.7 mcg/kg/hr Intravenous To OR  . docusate sodium  200 mg Oral BID  . DOPamine  0-10 mcg/kg/min Intravenous To OR  . epinephrine  0-10 mcg/min Intravenous To OR  . folic acid  1 mg Oral Daily  . guaiFENesin  600 mg Oral BID  . heparin-papaverine-plasmalyte irrigation   Irrigation To OR  . heparin 30,000 units/NS 1000 mL solution for CELLSAVER   Other To OR  . hydrALAZINE  25 mg Oral 3 times per day  . insulin (NOVOLIN-R) infusion   Intravenous To OR  . levofloxacin (LEVAQUIN) IV  750 mg Intravenous Q24H  . lisinopril  5 mg Oral Daily  . magnesium sulfate  40 mEq Other To OR  . multivitamin with minerals  1 tablet Oral Daily  . nitroGLYCERIN  2-200 mcg/min Intravenous To OR  . phenylephrine (NEO-SYNEPHRINE) Adult infusion   30-200 mcg/min Intravenous To OR  . polyethylene glycol  17 g Oral BID  . potassium chloride  80 mEq Other To OR  . spironolactone  25 mg Oral Daily  . thiamine  100 mg  Oral Daily  . tiotropium  18 mcg Inhalation Daily  . vancomycin 1000 mg in NS (1000 ml) irrigation for Dr. [name] case   Irrigation To OR  . vancomycin  1,250 mg Intravenous To OR   Continuous Infusions: . sodium chloride 10 mL/hr at 02/13/14 0000   PRN Meds: acetaminophen, ipratropium-albuterol, ondansetron (ZOFRAN) IV, oxyCODONE-acetaminophen  Antibiotics: Antibiotics Given (last 72 hours)    Date/Time Action Medication Dose Rate   02/14/14 1723 Given   levofloxacin (LEVAQUIN) IVPB 750 mg 750 mg 100 mL/hr   02/15/14 1700 Given   levofloxacin (LEVAQUIN) IVPB 750 mg 750 mg 100 mL/hr   02/16/14 1653 Given   levofloxacin (LEVAQUIN) IVPB 750 mg 750 mg 100 mL/hr      Day of Hospitalization: 6  Consults: Treatment Team:  Rounding Lbcardiology, MD Rexene Alberts, MD  Assessment/Plan:   1. CAD- Denies CP.  Severe 3VD s/p LHC.  CABG today by Dr. [name]  2. COPD- OK to proceed with surgery from pulmonology standpoint  3. Essential HTN- BP controlled  4. Cigarette smoker- Advised cessation  5. Acute hyponatremia- Cont to monitor closely     LOS: 6 days   Jones Bales, MD PGY-2, Internal Medicine Teaching Service 02/17/2014, 6:35 AM   Patient examined chart reviewed.  Lungs improved with no active wheezing  BP well controlled For CABG today.  All questions answered  Jenkins Rouge

## 2014-02-17 NOTE — Progress Notes (Signed)
[facility] BRIEF SICU PROGRESS NOTE  Day of Surgery  S/P Procedure(s) (LRB): CORONARY ARTERY BYPASS GRAFTING (CABG) (N/A) INTRAOPERATIVE TRANSESOPHAGEAL ECHOCARDIOGRAM (N/A)   Starting to wake on vent, follows commands NSR w/ stable hemodynamics on very low dose Neo drip O2 sats 100% Chest tube output low UOP excellent Labs okay  Plan: Continue routine early postop  Norton Bivins H 02/17/2014 6:28 PM

## 2014-02-17 NOTE — Transfer of Care (Signed)
Immediate Anesthesia Transfer of Care Note  Patient: Caleb [name]  Procedure(s) Performed: Procedure(s) with comments: CORONARY ARTERY BYPASS GRAFTING (CABG) (N/A) - Times 3 using bilateral mammary arteries and endoscopically harvested right saphenous vein INTRAOPERATIVE TRANSESOPHAGEAL ECHOCARDIOGRAM (N/A)  Patient Location: SICU  Anesthesia Type:General  Level of Consciousness: sedated and Patient remains intubated per anesthesia plan  Airway & Oxygen Therapy: Patient remains intubated per anesthesia plan and Patient placed on Ventilator (see vital sign flow sheet for setting)  Post-op Assessment: Report given to RN and Post -op Vital signs reviewed and stable  Post vital signs: Reviewed and stable  Last Vitals:  Filed Vitals:   02/17/14 0538  BP: 138/62  Pulse: 67  Temp:   Resp:     Complications: No apparent anesthesia complications

## 2014-02-17 NOTE — Op Note (Signed)
CARDIOTHORACIC SURGERY OPERATIVE NOTE  Date of Procedure: 02/17/2014  Preoperative Diagnosis:   Severe 3-vessel Coronary Artery Disease  S/P Acute Non-ST Segment Elevation Myocardial Infarction  Postoperative Diagnosis: Same  Procedure:    Coronary Artery Bypass Grafting x 3   Left Internal Mammary Artery to Distal Left Anterior Descending Coronary Artery  Right Internal Mammary Artery to Distal Right Coronary Artery  Saphenous Vein Graft to First Obtuse Marginal Branch of Left Circumflex Coronary Artery  Endoscopic Vein Harvest from Right Thigh  Surgeon: Valentina Gu. [name], MD  Assistant: John Giovanni, PA-C and Lemar Lofty, PA-S  Anesthesia: Lillia Abed, MD  Operative Findings:  Mild global LV systolic dysfunction, EF 95%  Good quality LIMA and RIMA conduit for grafting  Good quality SVG conduit for grafting  Diffuse CAD with fair target vessels for grafting  PDA chronically occluded - not graftable    BRIEF CLINICAL NOTE AND INDICATIONS FOR SURGERY  Patient is a 64 year old white male with no previous cardiac history but risk factors notable for history of hypertension, hypercholesterolemia, and long-standing tobacco abuse. Patient states that he first began to experience increased productive cough and shortness of breath approximately 2 weeks ago. Symptoms waxed and waned in severity until he developed somewhat acute onset of severe resting shortness of breath which prompted presentation to the emergency department 02/11/2014. On arrival of patient had diffuse wheezing in both lung fields and resting hypoxemia with oxygen saturation 89% on oxygen therapy with CPAP. Baseline electrocardiogram's were notable for sinus tachycardia with nonspecific ST and T wave changes, and portable chest x-ray revealed hyperinflation consistent with COPD. He was has been treated for presumed acute flare of COPD with symptomatic improvement. Initial set of cardiac enzymes were borderline  positive, but the patient has subsequently ruled in for an acute non-ST segment elevation myocardial infarction with Troponin I measured 2.17. BNP level was elevated 824.8. The patient was subsequently seen in consultation by cardiology and transthoracic echocardiogram revealed severe left ventricular systolic dysfunction with ejection fraction estimated 30-35%. The patient underwent left heart catheterization by Dr. Tamala Julian which revealed severe three-vessel coronary artery disease and ischemic cardiomyopathy with ejection fraction estimated 25%. Left ventricular end-diastolic pressure was elevated to 34 mmHg. Right heart catheterization was not performed. Elective cardiothoracic surgical consultation was requested.  The patient has been seen in consultation and counseled at length regarding the indications, risks and potential benefits of surgery.  All questions have been answered, and the patient provides full informed consent for the operation as described.    DETAILS OF THE OPERATIVE PROCEDURE  Preparation:  The patient is brought to the operating room on the above mentioned date and central monitoring was established by the anesthesia team including placement of Swan-Ganz catheter and radial arterial line. The patient is placed in the supine position on the operating table.  Intravenous antibiotics are administered. General endotracheal anesthesia is induced uneventfully. A Foley catheter is placed.  Baseline transesophageal echocardiogram was performed.  Findings were notable for mild global LV systolic dysfunction with ejection fraction estimated 50%.  LV function appeared considerably improved in comparison with how it looked at the time of admission.  There were no other significant abnormalities.  The patient's chest, abdomen, both groins, and both lower extremities are prepared and draped in a sterile manner. A time out procedure is performed.   Surgical Approach and Conduit Harvest:  A  median sternotomy incision was performed and both the left and right internal mammary arteries are dissected from the chest  wall and prepared for bypass grafting. The internal mammary arteries are both notably good quality conduit. Simultaneously, saphenous vein is obtained from the patient's right thigh using endoscopic vein harvest technique. The saphenous vein is notably good quality conduit. After removal of the saphenous vein, the small surgical incisions in the lower extremity are closed with absorbable suture. Following systemic heparinization, the internal mammary arteries were transected distally noted to have excellent flow.   Extracorporeal Cardiopulmonary Bypass and Myocardial Protection:  The pericardium is opened. The ascending aorta is normal size and mildly diseased in appearance.  There was hard calcified plaque in the transverse arch The ascending aorta and the right atrium are cannulated for cardioplegia bypass.  Adequate heparinization is verified.    A retrograde cardioplegia cannula is placed through the right atrium into the coronary sinus.  The entire pre-bypass portion of the operation was notable for stable hemodynamics.  Cardiopulmonary bypass was begun and the surface of the heart is inspected. Distal target vessels are selected for coronary artery bypass grafting. A cardioplegia cannula is placed in the ascending aorta.  A temperature probe was placed in the interventricular septum.  The patient is allowed to cool passively to [facility] systemic temperature.  The aortic cross clamp is applied and cold blood cardioplegia is delivered initially in an antegrade fashion through the aortic root.   Supplemental cardioplegia is given retrograde through the coronary sinus catheter.  Iced saline slush is applied for topical hypothermia.  The initial cardioplegic arrest is rapid with early diastolic arrest.  Repeat doses of cardioplegia are administered intermittently throughout the entire  cross clamp portion of the operation through the aortic root, through the coronary sinus catheter, and through subsequently placed vein grafts in order to maintain completely flat electrocardiogram and septal myocardial temperature below 15C.  Myocardial protection was felt to be excellent.  Coronary Artery Bypass Grafting:   The first obtuse marginal branch of the left circumflex coronary artery was grafted using a reversed saphenous vein graft in an end-to-side fashion.  At the site of distal anastomosis the target vessel was fair to good quality and measured approximately 1.5 mm in diameter.  The distal right coronary artery was grafted with the right internal mammary artery in an end-to-side fashion.  At the site of distal anastomosis the target vessel was diffusely diseased but good quality and measured approximately 2.0 mm in diameter.  The distal left anterior coronary artery was grafted with the left internal mammary artery in an end-to-side fashion.  At the site of distal anastomosis the target vessel was diffusely diseased and fair quality and measured approximately 1.5 mm in diameter.  The single proximal vein graft anastomosis was placed directly to the ascending aorta prior to removal of the aortic cross clamp.  The septal myocardial temperature rose rapidly after reperfusion of the internal mammary artery grafts.  The aortic cross clamp was removed after a total cross clamp time of 77 minutes.   Procedure Completion:  All proximal and distal coronary anastomoses were inspected for hemostasis and appropriate graft orientation. Epicardial pacing wires are fixed to the right ventricular outflow tract and to the right atrial appendage. The patient is rewarmed to 37C temperature. The patient is weaned and disconnected from cardiopulmonary bypass.  The patient's rhythm at separation from bypass was sinus.  The patient was weaned from cardiopulmonary bypass without any inotropic support.  Total cardiopulmonary bypass time for the operation was 99 minutes.  Followup transesophageal echocardiogram performed after separation from bypass revealed no  changes from the preoperative exam.  The aortic and venous cannula were removed uneventfully. Protamine was administered to reverse the anticoagulation. The mediastinum and pleural space were inspected for hemostasis and irrigated with saline solution. The mediastinum and both pleural spaces were drained using 4 chest tubes placed through separate stab incisions inferiorly.  The soft tissues anterior to the aorta were reapproximated loosely. The sternum is closed with double strength sternal wire. The soft tissues anterior to the sternum were closed in multiple layers and the skin is closed with a running subcuticular skin closure.  The post-bypass portion of the operation was notable for stable rhythm and hemodynamics.  No blood products were administered during the operation.   Disposition:  The patient tolerated the procedure well and is transported to the surgical intensive care in stable condition. There are no intraoperative complications. All sponge instrument and needle counts are verified correct at completion of the operation.    Valentina Gu. [name] MD 02/17/2014 1:29 PM

## 2014-02-17 NOTE — Progress Notes (Signed)
Notified RT Abigail Butts that patient meets criteria for wean. Informed that she will be here a few minutes is in the middle of a procedure.

## 2014-02-18 ENCOUNTER — Encounter (HOSPITAL_COMMUNITY): Payer: Self-pay | Admitting: Thoracic Surgery (Cardiothoracic Vascular Surgery)

## 2014-02-18 ENCOUNTER — Inpatient Hospital Stay (HOSPITAL_COMMUNITY): Payer: BLUE CROSS/BLUE SHIELD

## 2014-02-18 DIAGNOSIS — Z951 Presence of aortocoronary bypass graft: Secondary | ICD-10-CM

## 2014-02-18 LAB — POCT I-STAT 3, ART BLOOD GAS (G3+)
Acid-base deficit: 5 mmol/L — ABNORMAL HIGH (ref 0.0–2.0)
Bicarbonate: 19 mEq/L — ABNORMAL LOW (ref 20.0–24.0)
O2 Saturation: 97 %
Patient temperature: 36.9
TCO2: 20 mmol/L (ref 0–100)
pCO2 arterial: 31.6 mmHg — ABNORMAL LOW (ref 35.0–45.0)
pH, Arterial: 7.386 (ref 7.350–7.450)
pO2, Arterial: 96 mmHg (ref 80.0–100.0)

## 2014-02-18 LAB — CBC
HCT: 25.4 % — ABNORMAL LOW (ref 39.0–52.0)
HCT: 28.4 % — ABNORMAL LOW (ref 39.0–52.0)
Hemoglobin: 8.7 g/dL — ABNORMAL LOW (ref 13.0–17.0)
Hemoglobin: 9.6 g/dL — ABNORMAL LOW (ref 13.0–17.0)
MCH: 33.3 pg (ref 26.0–34.0)
MCH: 33.7 pg (ref 26.0–34.0)
MCHC: 34.3 g/dL (ref 30.0–36.0)
MCHC: 33.8 g/dL (ref 30.0–36.0)
MCV: 97.3 fL (ref 78.0–100.0)
MCV: 99.6 fL (ref 78.0–100.0)
Platelets: 96 10*3/uL — ABNORMAL LOW (ref 150–400)
Platelets: 115 10*3/uL — ABNORMAL LOW (ref 150–400)
RBC: 2.61 MIL/uL — ABNORMAL LOW (ref 4.22–5.81)
RBC: 2.85 MIL/uL — ABNORMAL LOW (ref 4.22–5.81)
RDW: 13 % (ref 11.5–15.5)
RDW: 13.1 % (ref 11.5–15.5)
WBC: 10.1 10*3/uL (ref 4.0–10.5)
WBC: 12.4 10*3/uL — ABNORMAL HIGH (ref 4.0–10.5)

## 2014-02-18 LAB — CREATININE, SERUM
Creatinine, Ser: 0.84 mg/dL (ref 0.50–1.35)
GFR calc Af Amer: >90 mL/min (ref >90)
GFR calc non Af Amer: 90 mL/min — ABNORMAL LOW (ref >90)

## 2014-02-18 LAB — BASIC METABOLIC PANEL
Anion gap: 5 (ref 5–15)
BUN: 8 mg/dL (ref 6–23)
CO2: 21 mmol/L (ref 19–32)
Calcium: 7.9 mg/dL — ABNORMAL LOW (ref 8.4–10.5)
Chloride: 106 mmol/L (ref 96–112)
Creatinine, Ser: 0.76 mg/dL (ref 0.50–1.35)
GFR calc Af Amer: >90 mL/min (ref >90)
GFR calc non Af Amer: >90 mL/min (ref >90)
Glucose, Bld: 113 mg/dL — ABNORMAL HIGH (ref 70–99)
Potassium: 4.3 mmol/L (ref 3.5–5.1)
Sodium: 132 mmol/L — ABNORMAL LOW (ref 135–145)

## 2014-02-18 LAB — GLUCOSE, CAPILLARY
Glucose-Capillary: 104 mg/dL — ABNORMAL HIGH (ref 70–99)
Glucose-Capillary: 107 mg/dL — ABNORMAL HIGH (ref 70–99)
Glucose-Capillary: 112 mg/dL — ABNORMAL HIGH (ref 70–99)
Glucose-Capillary: 127 mg/dL — ABNORMAL HIGH (ref 70–99)
Glucose-Capillary: 139 mg/dL — ABNORMAL HIGH (ref 70–99)
Glucose-Capillary: 184 mg/dL — ABNORMAL HIGH (ref 70–99)

## 2014-02-18 LAB — MAGNESIUM
Magnesium: 3 mg/dL — ABNORMAL HIGH (ref 1.5–2.5)
Magnesium: 2.4 mg/dL (ref 1.5–2.5)

## 2014-02-18 MED ORDER — SODIUM CHLORIDE 0.9 % IJ SOLN: 3.0000 mL | INTRAMUSCULAR | Status: DC | PRN

## 2014-02-18 MED ORDER — FUROSEMIDE 40 MG PO TABS
40.0000 mg | ORAL_TABLET | Freq: Every day | ORAL | Status: DC
  Administered 2014-02-19 – 2014-02-20 (×2): 40 mg via ORAL
  Filled 2014-02-18 (×2): qty 1

## 2014-02-18 MED ORDER — MOVING RIGHT ALONG BOOK
Freq: Once | Status: AC
  Administered 2014-02-18: 09:00:00
  Filled 2014-02-18: qty 1

## 2014-02-18 MED ORDER — MORPHINE SULFATE 2 MG/ML IJ SOLN
2.0000 mg | INTRAMUSCULAR | Status: DC | PRN
  Administered 2014-02-18: 2 mg via INTRAVENOUS
  Filled 2014-02-18: qty 1

## 2014-02-18 MED ORDER — POTASSIUM CHLORIDE CRYS ER 20 MEQ PO TBCR
20.0000 meq | EXTENDED_RELEASE_TABLET | Freq: Every day | ORAL | Status: DC
  Administered 2014-02-19 – 2014-02-20 (×2): 20 meq via ORAL
  Filled 2014-02-18 (×3): qty 1

## 2014-02-18 MED ORDER — SODIUM CHLORIDE 0.9 % IV SOLN
250.0000 mL | INTRAVENOUS | Status: DC | PRN
Start: 2014-02-18 — End: 2014-02-20

## 2014-02-18 MED ORDER — SODIUM CHLORIDE 0.9 % IJ SOLN
3.0000 mL | Freq: Two times a day (BID) | INTRAMUSCULAR | Status: DC
  Administered 2014-02-18 – 2014-02-19 (×3): 3 mL via INTRAVENOUS

## 2014-02-18 MED ORDER — FUROSEMIDE 10 MG/ML IJ SOLN
20.0000 mg | Freq: Four times a day (QID) | INTRAMUSCULAR | Status: AC
  Administered 2014-02-18 (×3): 20 mg via INTRAVENOUS
  Filled 2014-02-18 (×3): qty 2

## 2014-02-18 MED ORDER — PRAVASTATIN SODIUM 40 MG PO TABS
40.0000 mg | ORAL_TABLET | Freq: Every day | ORAL | Status: DC
  Administered 2014-02-18 – 2014-02-19 (×2): 40 mg via ORAL
  Filled 2014-02-18 (×3): qty 1

## 2014-02-18 MED FILL — Heparin Sodium (Porcine) Inj 1000 Unit/ML: INTRAMUSCULAR | Qty: 30 | Status: AC

## 2014-02-18 MED FILL — Magnesium Sulfate Inj 50%: INTRAMUSCULAR | Qty: 10 | Status: AC

## 2014-02-18 MED FILL — Lidocaine HCl IV Inj 20 MG/ML: INTRAVENOUS | Qty: 5 | Status: AC

## 2014-02-18 MED FILL — Heparin Sodium (Porcine) Inj 1000 Unit/ML: INTRAMUSCULAR | Qty: 10 | Status: AC

## 2014-02-18 MED FILL — Mannitol IV Soln 20%: INTRAVENOUS | Qty: 500 | Status: AC

## 2014-02-18 MED FILL — Potassium Chloride Inj 2 mEq/ML: INTRAVENOUS | Qty: 40 | Status: AC

## 2014-02-18 MED FILL — Sodium Chloride IV Soln 0.9%: INTRAVENOUS | Qty: 1000 | Status: AC

## 2014-02-18 MED FILL — Electrolyte-R (PH 7.4) Solution: INTRAVENOUS | Qty: 5000 | Status: AC

## 2014-02-18 NOTE — Progress Notes (Signed)
[address]       ,[place] [address]             [phone]        CARDIOTHORACIC SURGERY PROGRESS NOTE   R1 Day Post-Op Procedure(s) (LRB): CORONARY ARTERY BYPASS GRAFTING (CABG) (N/A) INTRAOPERATIVE TRANSESOPHAGEAL ECHOCARDIOGRAM (N/A)  Subjective: Looks good.  Mild soreness in chest.  Breathing comfortably.  Objective: Vital signs: BP Readings from Last 1 Encounters:  02/18/14 117/56   Pulse Readings from Last 1 Encounters:  02/18/14 73   Resp Readings from Last 1 Encounters:  02/18/14 19   Temp Readings from Last 1 Encounters:  02/18/14 98.8 F (37.1 C)     Hemodynamics: PAP: (25-45)/(13-24) 33/14 mmHg CO:  [3.1 L/min-4.2 L/min] 4.1 L/min CI:  [1.9 L/min/m2-2.5 L/min/m2] 2.4 L/min/m2  Physical Exam:  Rhythm:   sinus  Breath sounds: clear  Heart sounds:  RRR  Incisions:  Dressing dry, intact  Abdomen:  Soft, non-distended, non-tender  Extremities:  Warm, well-perfused  Chest tubes:  Low volume thin serosanguinous output   Intake/Output from previous day: 02/03 0701 - 02/04 0700 In: 6630.4 [I.V.:4737.4; Blood:263; NG/GT:30; IV Piggyback:1600] Out: 9794 [Urine:3815; Blood:500; Chest Tube:746] Intake/Output this shift: Total I/O In: 50 [I.V.:50] Out: 80 [Urine:50; Chest Tube:30]  Lab Results:  CBC: Recent Labs  02/17/14 1947 02/17/14 1951 02/18/14 0415  WBC 7.8  --  10.1  HGB 8.5* 8.2* 8.7*  HCT 25.0* 24.0* 25.4*  PLT 76*  --  96*    BMET:  Recent Labs  02/17/14 0300  02/17/14 1951 02/18/14 0415  NA 127*  < > 133* 132*  K 3.6  < > 4.4 4.3  CL 96  < > 104 106  CO2 22  --   --  21  GLUCOSE 113*  < > 118* 113*  BUN 16  < > 8 8  CREATININE 0.88  < > 0.60 0.76  CALCIUM 8.5  --   --  7.9*  < > = values in this interval not displayed.   CBG (last 3)   Recent Labs  02/17/14 1857 02/17/14 2330 02/18/14 0350  GLUCAP 108* 164* 107*    ABG    Component Value Date/Time   PHART 7.386 02/18/2014 0436   PCO2ART 31.6* 02/18/2014 0436   PO2ART 96.0 02/18/2014 0436   HCO3 19.0* 02/18/2014 0436   TCO2 20 02/18/2014 0436   ACIDBASEDEF 5.0* 02/18/2014 0436   O2SAT 97.0 02/18/2014 0436    CXR: PORTABLE CHEST - 1 VIEW  COMPARISON: Portable chest x-ray of 02/17/2014  FINDINGS: The endotracheal tube has been removed. There is little change in aeration with bilateral chest tubes present. No pneumothorax is seen. Cardiomegaly is stable. Median sternotomy sutures are noted. Swan-Ganz catheter is unchanged in position  IMPRESSION: 1. Endotracheal tube removed. No change in aeration. 2. Bilateral chest tubes remain. No pneumothorax.   Electronically Signed  By: Ivar Drape M.D.  On: 02/18/2014 08:00   EKG: NSR w/out acute ischemic changes     Assessment/Plan: S/P Procedure(s) (LRB): CORONARY ARTERY BYPASS GRAFTING (CABG) (N/A) INTRAOPERATIVE TRANSESOPHAGEAL ECHOCARDIOGRAM (N/A)  Doing well POD1 Maintaining NSR w/ stable hemodynamics, no drips Breathing comfortably w/ O2 sats 97-99% on room air Expected post op acute blood loss anemia, stable Expected post op atelectasis, very mild Expected post op volume excess, mild S/P acute non-STEMI on presentation Acute systolic CHF resolved HTN COPD Tobacco abuse   Mobilize  D/C tubes and lines  Diuresis  Watch  anemia  Continue nebs for now  Transfer step down   Deadrick Stidd H 02/18/2014 9:01 AM

## 2014-02-18 NOTE — Progress Notes (Signed)
Report called to Adriene on 2W, made aware of foley cath DC time.  Transfer to 2W22 via wheelchair.

## 2014-02-18 NOTE — Progress Notes (Signed)
CARDIAC REHAB PHASE I   PRE:  Rate/Rhythm: 76 SR    BP: sitting 139/54    SaO2: 93 RA  MODE:  Ambulation: 150 ft   POST:  Rate/Rhythm: 95 SR    BP: sitting 138/64     SaO2: 91 RA  Pt able to stand with just min assist. Ambulated with RW. Steady. Tolerated remarkably well. Return to recliner. No c/o. Encouraged IS. [phone]   Josephina Shih [place] CES, ACSM 02/18/2014 3:08 PM

## 2014-02-19 ENCOUNTER — Inpatient Hospital Stay (HOSPITAL_COMMUNITY): Payer: BLUE CROSS/BLUE SHIELD

## 2014-02-19 LAB — CBC
HCT: 24.7 % — ABNORMAL LOW (ref 39.0–52.0)
Hemoglobin: 8.3 g/dL — ABNORMAL LOW (ref 13.0–17.0)
MCH: 33.1 pg (ref 26.0–34.0)
MCHC: 33.6 g/dL (ref 30.0–36.0)
MCV: 98.4 fL (ref 78.0–100.0)
Platelets: 102 10*3/uL — ABNORMAL LOW (ref 150–400)
RBC: 2.51 MIL/uL — ABNORMAL LOW (ref 4.22–5.81)
RDW: 13.1 % (ref 11.5–15.5)
WBC: 9.1 10*3/uL (ref 4.0–10.5)

## 2014-02-19 LAB — BASIC METABOLIC PANEL
Anion gap: 6 (ref 5–15)
BUN: 8 mg/dL (ref 6–23)
CO2: 24 mmol/L (ref 19–32)
Calcium: 7.9 mg/dL — ABNORMAL LOW (ref 8.4–10.5)
Chloride: 100 mmol/L (ref 96–112)
Creatinine, Ser: 0.74 mg/dL (ref 0.50–1.35)
GFR calc Af Amer: >90 mL/min (ref >90)
GFR calc non Af Amer: >90 mL/min (ref >90)
Glucose, Bld: 113 mg/dL — ABNORMAL HIGH (ref 70–99)
Potassium: 3.7 mmol/L (ref 3.5–5.1)
Sodium: 130 mmol/L — ABNORMAL LOW (ref 135–145)

## 2014-02-19 LAB — GLUCOSE, CAPILLARY
Glucose-Capillary: 111 mg/dL — ABNORMAL HIGH (ref 70–99)
Glucose-Capillary: 118 mg/dL — ABNORMAL HIGH (ref 70–99)

## 2014-02-19 MED ORDER — POTASSIUM CHLORIDE CRYS ER 20 MEQ PO TBCR
40.0000 meq | EXTENDED_RELEASE_TABLET | Freq: Once | ORAL | Status: AC
  Administered 2014-02-19: 40 meq via ORAL
  Filled 2014-02-19: qty 2

## 2014-02-19 MED ORDER — CARVEDILOL 6.25 MG PO TABS
6.2500 mg | ORAL_TABLET | Freq: Two times a day (BID) | ORAL | Status: DC
  Administered 2014-02-19 – 2014-02-20 (×2): 6.25 mg via ORAL
  Filled 2014-02-19 (×4): qty 1

## 2014-02-19 MED ORDER — LISINOPRIL 2.5 MG PO TABS
2.5000 mg | ORAL_TABLET | Freq: Every day | ORAL | Status: DC
  Administered 2014-02-19: 2.5 mg via ORAL
  Filled 2014-02-19 (×2): qty 1

## 2014-02-19 NOTE — Progress Notes (Signed)
EPW removed. Kathleen Argue S 9:14 AM

## 2014-02-19 NOTE — Progress Notes (Signed)
Pt ambulated 500 ft with the assistance of a walker. Pt tolerated activity well. Pt now resting in chair. Will continue to monitor.    Caleb [name]

## 2014-02-19 NOTE — Discharge Summary (Signed)
Physician Discharge Summary  Patient ID: Caleb [name] MRN: [id number] DOB/AGE: 20-May-1950 64 y.o.  Admit date: 02/11/2014 Discharge date: 02/20/2014  Admission Diagnoses:  Patient Active Problem List   Diagnosis Date Noted  . Congestive heart disease   . Coronary artery disease involving native coronary artery of native heart without angina pectoris   . Coronary artery disease involving native coronary artery of native heart with other form of angina pectoris   . Tobacco abuse   . Essential hypertension   . Hypercholesteremia   . Lactic acidosis 02/12/2014  . Sinus tachycardia 02/12/2014  . Elevated troponin   . COPD (chronic obstructive pulmonary disease) 02/11/2014  . COPD with acute exacerbation 02/11/2014  . Demand ischemia of myocardium 02/11/2014  . Abnormal EKG 02/11/2014  . Acute systolic congestive heart failure 02/11/2014   Discharge Diagnoses:   Patient Active Problem List   Diagnosis Date Noted  . 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 Congestive heart disease   . Coronary artery disease involving native coronary artery of native heart without angina pectoris   . Coronary artery disease involving native coronary artery of native heart with other form of angina pectoris   . Tobacco abuse   . Essential hypertension   . Hypercholesteremia   . Lactic acidosis 02/12/2014  . Sinus tachycardia 02/12/2014  . Elevated troponin   . COPD (chronic obstructive pulmonary disease) 02/11/2014  . COPD with acute exacerbation 02/11/2014  . Demand ischemia of myocardium 02/11/2014  . Abnormal EKG 02/11/2014  . Acute systolic congestive heart failure 02/11/2014   Discharged Condition: good  History of Present Illness:  Caleb [name] is a 64 year old white male with no previous cardiac history but risk factors notable for history of hypertension, hypercholesterolemia, and long-standing tobacco abuse. Patient states that he first began to experience increased productive cough and shortness of  breath approximately 2 weeks ago. Symptoms waxed and waned in severity until he developed somewhat acute onset of severe resting shortness of breath which prompted presentation to the emergency department 02/11/2014. On arrival of patient had diffuse wheezing in both lung fields and resting hypoxemia with oxygen saturation 89% on oxygen therapy with CPAP. Baseline electrocardiogram's were notable for sinus tachycardia with nonspecific ST and T wave changes, and portable chest x-ray revealed hyperinflation consistent with COPD. He was has been treated for presumed acute flare of COPD with symptomatic improvement. Initial set of cardiac enzymes were borderline positive, but the patient has subsequently ruled in for an acute non-ST segment elevation myocardial infarction with Troponin I measured 2.17. BNP level was elevated 824.8.Cardiology consultation was obtained.  He underwent Echocardiogram which showed a reduced EF of 25%.  He also underwent cardiac catheterization which revealed 3 vessel CAD.  It was felt CABG would be the best treatment option and TCTS was consulted.  The patient was evaluated by Dr. [name] who was in agreement the patient would benefit from CABG procedure.  The risks and benefits of the procedure were explained to the patient and he was agreeable to proceed.   Hospital Course:   The patient was taken to the operating room on 02/17/2014.  He underwent CABG x 3 utilizing LIMA to LAD, RIMA to Distal RCA and SVG to OM.  He also underwent endoscopic saphenous vein harvesting from his right thigh.  He tolerated the procedure well and was taken to the SICU in stable condition.  The patient was extubated the evening of surgery.  During the stay in the SICU the patient was  weaned off Neo drip as tolerated.  His chest tubes and arterial lines were removed without difficulty.  He was ambulating without difficulty.  He was maintaining NSR and transferred to the stepdown unit in stable condition.   The patient continues to progress.  He has been weaned off oxygen.  His CXR has some residual atelectasis and small amount of pleural fluid in the right base.  He continues to maintain NSR and his pacing wires have been removed without difficulty.  He is ambulating without assistance and tolerating a regular diet.  Should no further issues arise we anticipate discharge home on 02/20/2014.      Significant Diagnostic Studies: cardiac graphics:   HEMODYNAMICS: Aortic pressure 142/78 mmHg; LV pressure 145/22 mmHg; LVEDP 34 mmHg  ANGIOGRAPHIC DATA: The left main coronary artery is calcified but widely patent..  The left anterior descending artery is heavily calcified in the proximal and mid segment. In the mid segment just beyond the origin of the first of perforator there is an eccentric high-grade stenosis that appears to represent a ruptured plaque. The first diagonal arises proximal to this stenosis and a large second diagonal arises distal to the stenosis. After the second diagonal there is segmental 70-80% stenosis in the LAD. This is also within a heavily calcified segment. The LAD supplies collaterals to the PDA and distal right coronary..  The left circumflex artery is is severely diseased. After the small first obtuse marginal the circumflex is totally occluded and fills late by left to right collaterals. The late filling circumflex demonstrates a large second obtuse marginal and a smaller bifurcating third obtuse marginal.  The right coronary artery is heavily calcified and somewhat tortuous proximally. The distal artery contains an eccentric thrombus filled 95% stenosis. The PDA is subtotally occluded proximally. A moderate-sized left ventricular branch arises distally.[name]  PCI RESULTS: Not indicated  LEFT VENTRICULOGRAM: Left ventricular angiogram was done in the 30 RAO projection and revealed global hypokinesis with ejection fraction of 25%.  Treatments: surgery:    Coronary Artery  Bypass Grafting x 3 Left Internal Mammary Artery to Distal Left Anterior Descending Coronary Artery Right Internal Mammary Artery to Distal Right Coronary Artery Saphenous Vein Graft to First Obtuse Marginal Branch of Left Circumflex Coronary Artery Endoscopic Vein Harvest from Right Thigh  Disposition: Home  Discharge Medications:  The patient has been discharged on:   1.Beta Blocker:  Yes [ x  ]                              No   [   ]                              If No, reason:  2.Ace Inhibitor/ARB: Yes [x   ]                                     No  [   ]                                     If No, reason:   3.Statin:   Yes [  x ]                  No  [   ]  If No, reason:  4.Ecasa:  Yes  [ x  ]                  No   [   ]                  If No, reason:    Medication List    STOP taking these medications        amLODipine 5 MG tablet  Commonly known as:  NORVASC      TAKE these medications        aspirin 325 MG EC tablet  Take 1 tablet (325 mg total) by mouth daily.     carvedilol 6.25 MG tablet  Commonly known as:  COREG  Take 1 tablet (6.25 mg total) by mouth 2 (two) times daily with a meal.     lisinopril 5 MG tablet  Commonly known as:  PRINIVIL,ZESTRIL  Take 1 tablet (5 mg total) by mouth daily.     oxyCODONE 5 MG immediate release tablet  Commonly known as:  Oxy IR/ROXICODONE  Take 1-2 tablets (5-10 mg total) by mouth every 3 (three) hours as needed for severe pain.     pravastatin 40 MG tablet  Commonly known as:  PRAVACHOL  Take 40 mg by mouth daily.     spironolactone 25 MG tablet  Commonly known as:  ALDACTONE  Take 25 mg by mouth daily.     traMADol 50 MG tablet  Commonly known as:  ULTRAM  Take 1-2 tablets (50-100 mg total) by mouth every 4 (four) hours as needed for moderate pain.              Follow-up Information    Follow up with Rexene Alberts, MD.    Specialty:  Cardiothoracic Surgery   Why:  Office will contact you with appointment   Contact information:   [address] [place] [address] [phone]       Follow up with [facility].   Why:  Please get CXR 1 hour prior to your appointment with Dr. Leota Sauers information:   [facility]       Follow up with Richardson Dopp, PA-C On 03/11/2014.   Specialty:  Physician Assistant   Why:  Appointment is at 11:30   Contact information:   [address] [place] [address] [phone]       Signed: Ellwood Handler 02/20/2014, 9:18 AM

## 2014-02-19 NOTE — Progress Notes (Signed)
CARDIAC REHAB PHASE I   PRE:  Rate/Rhythm: 78 SR    BP: sitting 139/67    SaO2: 97 RA  MODE:  Ambulation: 350 ft   POST:  Rate/Rhythm: 98 SR    BP: sitting 155/52     SaO2: 98 RA  Tolerated well with RW, fairly steady. Tired toward end. Ed completed. Voiced understanding and requests his name be sent to [place]. Pt motivated to quit smoking. [phone]   Caleb [name] [place] CES, ACSM 02/19/2014 11:53 AM

## 2014-02-19 NOTE — Progress Notes (Signed)
Pt ambulated in hall 529ft, stopped once, some sob noted, educated on deep breathing and pursed lip breathing, used front wheel walker. Sherrie Mustache 5:48 PM

## 2014-02-19 NOTE — Progress Notes (Signed)
02/18/2014 07:45 PM Patient ambulated about 150 feet in the hallway with a front wheel walker and nurse. Patient tolerated the activity well.  Will continue to monitor the patient. Lupita Dawn

## 2014-02-19 NOTE — Discharge Instructions (Signed)
Coronary Artery Bypass Grafting, Care After °Refer to this sheet in the next few weeks. These instructions provide you with information on caring for yourself after your procedure. Your health care provider may also give you more specific instructions. Your treatment has been planned according to current medical practices, but problems sometimes occur. Call your health care provider if you have any problems or questions after your procedure. °WHAT TO EXPECT AFTER THE PROCEDURE °Recovery from surgery will be different for everyone. Some people feel well after 3 or 4 weeks, while for others it takes longer. After your procedure, it is typical to have the following: °· Nausea and a lack of appetite.   °· Constipation. °· Weakness and fatigue.   °· Depression or irritability.   °· Pain or discomfort at your incision site. °HOME CARE INSTRUCTIONS °· Take medicines only as directed by your health care provider. Do not stop taking medicines or start any new medicines without first checking with your health care provider. °· Take your pulse as directed by your health care provider. °· Perform deep breathing as directed by your health care provider. If you were given a device called an incentive spirometer, use it to practice deep breathing several times a day. Support your chest with a pillow or your arms when you take deep breaths or cough. °· Keep incision areas clean, dry, and protected. Remove or change any bandages (dressings) only as directed by your health care provider. You may have skin adhesive strips over the incision areas. Do not take the strips off. They will fall off on their own. °· Check incision areas daily for any swelling, redness, or drainage. °· If incisions were made in your legs, do the following: °¨ Avoid crossing your legs.   °¨ Avoid sitting for long periods of time. Change positions every 30 minutes.   °¨ Elevate your legs when you are sitting. °· Wear compression stockings as directed by your  health care provider. These stockings help keep blood clots from forming in your legs. °· Take showers once your health care provider approves. Until then, only take sponge baths. Pat incisions dry. Do not rub incisions with a washcloth or towel. Do not take baths, swim, or use a hot tub until your health care provider approves. °· Eat foods that are high in fiber, such as raw fruits and vegetables, whole grains, beans, and nuts. Meats should be lean cut. Avoid canned, processed, and fried foods. °· Drink enough fluid to keep your urine clear or pale yellow. °· Weigh yourself every day. This helps identify if you are retaining fluid that may make your heart and lungs work harder. °· Rest and limit activity as directed by your health care provider. You may be instructed to: °¨ Stop any activity at once if you have chest pain, shortness of breath, irregular heartbeats, or dizziness. Get help right away if you have any of these symptoms. °¨ Move around frequently for short periods or take short walks as directed by your health care provider. Increase your activities gradually. You may need physical therapy or cardiac rehabilitation to help strengthen your muscles and build your endurance. °¨ Avoid lifting, pushing, or pulling anything heavier than 10 lb (4.5 kg) for at least 6 weeks after surgery. °· Do not drive until your health care provider approves.  °· Ask your health care provider when you may return to work. °· Ask your health care provider when you may resume sexual activity. °· Keep all follow-up visits as directed by your health care   provider. This is important. °SEEK MEDICAL CARE IF: °· You have swelling, redness, increasing pain, or drainage at the site of an incision. °· You have a fever. °· You have swelling in your ankles or legs. °· You have pain in your legs.   °· You gain 2 or more pounds (0.9 kg) a day. °· You are nauseous or vomit. °· You have diarrhea.  °SEEK IMMEDIATE MEDICAL CARE IF: °· You have  chest pain that goes to your jaw or arms. °· You have shortness of breath.   °· You have a fast or irregular heartbeat.   °· You notice a "clicking" in your breastbone (sternum) when you move.   °· You have numbness or weakness in your arms or legs. °· You feel dizzy or light-headed.   °MAKE SURE YOU: °· Understand these instructions. °· Will watch your condition. °· Will get help right away if you are not doing well or get worse. °Document Released: 07/21/2004 Document Revised: 05/18/2013 Document Reviewed: 06/10/2012 °ExitCare® Patient Information ©2015 [employer]. This information is not intended to replace advice given to you by your health care provider. Make sure you discuss any questions you have with your health care provider. ° °Endoscopic Saphenous Vein Harvesting °Care After °Refer to this sheet in the next few weeks. These instructions provide you with information on caring for yourself after your procedure. Your health care provider may also give you more specific instructions. Your treatment has been planned according to current medical practices, but problems sometimes occur. Call your health care provider if you have any problems or questions after your procedure. °HOME CARE INSTRUCTIONS °Medicine °· Take whatever pain medicine your surgeon prescribes. Follow the directions carefully. Do not take over-the-counter pain medicine unless your surgeon says it is okay. Some pain medicine can cause bleeding problems for several weeks after surgery. °· Follow your surgeon's instructions about driving. You will probably not be permitted to drive after heart surgery. °· Take any medicines your surgeon prescribes. Any medicines you took before your heart surgery should be checked with your health care provider before you start taking them again. °Wound care °· If your surgeon has prescribed an elastic bandage or stocking, ask how long you should wear it. °· Check the area around your surgical cuts  (incisions) whenever your bandages (dressings) are changed. Look for any redness or swelling. °· You will need to return to have the stitches (sutures) or staples taken out. Ask your surgeon when to do that. °· Ask your surgeon when you can shower or bathe. °Activity °· Try to keep your legs raised when you are sitting. °· Do any exercises your health care providers have given you. These may include deep breathing exercises, coughing, walking, or other exercises. °SEEK MEDICAL CARE IF: °· You have any questions about your medicines. °· You have more leg pain, especially if your pain medicine stops working. °· New or growing bruises develop on your leg. °· Your leg swells, feels tight, or becomes red. °· You have numbness in your leg. °SEEK IMMEDIATE MEDICAL CARE IF: °· Your pain gets much worse. °· Blood or fluid leaks from any of the incisions. °· Your incisions become warm, swollen, or red. °· You have chest pain. °· You have trouble breathing. °· You have a fever. °· You have more pain near your leg incision. °MAKE SURE YOU: °· Understand these instructions. °· Will watch your condition. °· Will get help right away if you are not doing well or get worse. °Document Released: 09/13/2010   Document Revised: 01/06/2013 Document Reviewed: 09/13/2010 °ExitCare® Patient Information ©2015 [employer]. This information is not intended to replace advice given to you by your health care provider. Make sure you discuss any questions you have with your health care provider. ° ° °

## 2014-02-19 NOTE — Progress Notes (Addendum)
      [address]       [place] [address]             [phone]      2 Days Post-Op Procedure(s) (LRB): CORONARY ARTERY BYPASS GRAFTING (CABG) (N/A) INTRAOPERATIVE TRANSESOPHAGEAL ECHOCARDIOGRAM (N/A)   Subjective:  Caleb [name] has no complaints.  He is doing well and wants to go home.  He is ambulating without difficulty. + BM  Objective: Vital signs in last 24 hours: Temp:  [97.8 F (36.6 C)-99.4 F (37.4 C)] 99.4 F (37.4 C) (02/05 0419) Pulse Rate:  [69-79] 74 (02/05 0419) Cardiac Rhythm:  [-] Normal sinus rhythm (02/04 1945) Resp:  [17-29] 18 (02/05 0419) BP: (111-140)/(51-58) 112/51 mmHg (02/05 0419) SpO2:  [94 %-99 %] 95 % (02/05 0419) Weight:  [147 lb 14.9 oz (67.1 kg)] 147 lb 14.9 oz (67.1 kg) (02/05 0419)  Hemodynamic parameters for last 24 hours: PAP: (33-38)/(14-15) 38/15 mmHg CO:  [4.1 L/min] 4.1 L/min CI:  [2.4 L/min/m2] 2.4 L/min/m2  Intake/Output from previous day: 02/04 0701 - 02/05 0700 In: 620 [P.O.:420; I.V.:100; IV Piggyback:100] Out: 855 [Urine:775; Chest Tube:80]  General appearance: alert, cooperative and no distress Heart: regular rate and rhythm Lungs: clear to auscultation bilaterally Abdomen: soft, non-tender; bowel sounds normal; no masses,  no organomegaly Extremities: edema minimal Wound: clean and dry  Lab Results:  Recent Labs  02/18/14 1904 02/19/14 0520  WBC 12.4* 9.1  HGB 9.6* 8.3*  HCT 28.4* 24.7*  PLT 115* 102*   BMET:  Recent Labs  02/18/14 0415 02/18/14 1904 02/19/14 0520  NA 132*  --  130*  K 4.3  --  3.7  CL 106  --  100  CO2 21  --  24  GLUCOSE 113*  --  113*  BUN 8  --  8  CREATININE 0.76 0.84 0.74  CALCIUM 7.9*  --  7.9*    PT/INR:  Recent Labs  02/17/14 1413  LABPROT 16.4*  INR 1.31   ABG    Component Value Date/Time   PHART 7.386 02/18/2014 0436   HCO3 19.0* 02/18/2014 0436   TCO2 20 02/18/2014 0436   ACIDBASEDEF 5.0* 02/18/2014 0436   O2SAT 97.0 02/18/2014 0436    CBG (last 3)   Recent Labs  02/18/14 2133 02/18/14 2341 02/19/14 0414  GLUCAP 127* 112* 111*    Assessment/Plan: S/P Procedure(s) (LRB): CORONARY ARTERY BYPASS GRAFTING (CABG) (N/A) INTRAOPERATIVE TRANSESOPHAGEAL ECHOCARDIOGRAM (N/A)  1. CV- NSR good rate and pressure control continue Lopressor at 12.5 mg bid, will watch pressure if able to tolerate can hopefully start ACE prior to discharge 2. Pulm- no acute issues, off oxygen encouraged good use of IS 3. Renal- creatinine WNL, weight is up 14 lbs, continue lasix 4. CBGs controlled, patient not a diabetic will d/c fingersticks 5. Dispo- patient stable, d/c EPW, likely home in AM   LOS: 8 days    BARRETT, ERIN 02/19/2014  I have seen and examined the patient and agree with the assessment and plan as outlined.  Will stop metoprolol and restart carvedilol at reduced dose.  Start low dose ACE-I.  Stop lasix and restart spironolactone at d/c.  Possible d/c home 1-2 days.  Caleb [name] H 02/19/2014 9:04 AM

## 2014-02-20 MED ORDER — TRAMADOL HCL 50 MG PO TABS: 50.0000 mg | ORAL_TABLET | ORAL | Status: DC | PRN

## 2014-02-20 MED ORDER — CARVEDILOL 6.25 MG PO TABS: 6.2500 mg | ORAL_TABLET | Freq: Two times a day (BID) | ORAL | Status: DC

## 2014-02-20 MED ORDER — OXYCODONE HCL 5 MG PO TABS: 5.0000 mg | ORAL_TABLET | ORAL | Status: DC | PRN

## 2014-02-20 MED ORDER — ASPIRIN 325 MG PO TBEC: 325.0000 mg | DELAYED_RELEASE_TABLET | Freq: Every day | ORAL | Status: DC

## 2014-02-20 MED ORDER — LISINOPRIL 5 MG PO TABS
5.0000 mg | ORAL_TABLET | Freq: Every day | ORAL | Status: DC
  Administered 2014-02-20: 5 mg via ORAL
  Filled 2014-02-20: qty 1

## 2014-02-20 MED ORDER — LISINOPRIL 5 MG PO TABS: 5.0000 mg | ORAL_TABLET | Freq: Every day | ORAL | Status: DC

## 2014-02-20 NOTE — Progress Notes (Signed)
Pt discharged home with wife Discharge instructions given & reviewed Education discussed  IV dc'd  Tele dc'd  Sutures removed, steri strips applied with benzoine Pt discharged via wheelchair with RN and NT All patient belongs at side.   Kathleen Argue S 1:07 PM

## 2014-02-20 NOTE — Progress Notes (Addendum)
      [address]       [place] [address]             [phone]      3 Days Post-Op Procedure(s) (LRB): CORONARY ARTERY BYPASS GRAFTING (CABG) (N/A) INTRAOPERATIVE TRANSESOPHAGEAL ECHOCARDIOGRAM (N/A)   Subjective:  Caleb [name] complains of cough this morning.  States for the most part its dry, but he occasionally gets clear sputum up.  He is ambulating without difficulty.  + BM  Objective: Vital signs in last 24 hours: Temp:  [99 F (37.2 C)-99.6 F (37.6 C)] 99 F (37.2 C) (02/06 0300) Pulse Rate:  [78-93] 93 (02/06 0300) Cardiac Rhythm:  [-] Normal sinus rhythm (02/06 0719) Resp:  [17-20] 17 (02/06 0300) BP: (116-146)/(49-61) 146/61 mmHg (02/06 0300) SpO2:  [96 %-100 %] 99 % (02/06 0300) Weight:  [147 lb 14.9 oz (67.1 kg)] 147 lb 14.9 oz (67.1 kg) (02/06 0300)  Intake/Output from previous day: 02/05 0701 - 02/06 0700 In: 480 [P.O.:480] Out: -   General appearance: alert, cooperative and no distress Heart: regular rate and rhythm Lungs: clear to auscultation bilaterally Abdomen: soft, non-tender; bowel sounds normal; no masses,  no organomegaly Extremities: edema trace Wound: clean and dry  Lab Results:  Recent Labs  02/18/14 1904 02/19/14 0520  WBC 12.4* 9.1  HGB 9.6* 8.3*  HCT 28.4* 24.7*  PLT 115* 102*   BMET:  Recent Labs  02/18/14 0415 02/18/14 1904 02/19/14 0520  NA 132*  --  130*  K 4.3  --  3.7  CL 106  --  100  CO2 21  --  24  GLUCOSE 113*  --  113*  BUN 8  --  8  CREATININE 0.76 0.84 0.74  CALCIUM 7.9*  --  7.9*    PT/INR:  Recent Labs  02/17/14 1413  LABPROT 16.4*  INR 1.31   ABG    Component Value Date/Time   PHART 7.386 02/18/2014 0436   HCO3 19.0* 02/18/2014 0436   TCO2 20 02/18/2014 0436   ACIDBASEDEF 5.0* 02/18/2014 0436   O2SAT 97.0 02/18/2014 0436   CBG (last 3)   Recent Labs  02/18/14 2341 02/19/14 0414 02/19/14 0753  GLUCAP 112* 111* 118*    Assessment/Plan: S/P Procedure(s)  (LRB): CORONARY ARTERY BYPASS GRAFTING (CABG) (N/A) INTRAOPERATIVE TRANSESOPHAGEAL ECHOCARDIOGRAM (N/A)  1. CV- NSR, mild hypertension this morning- will continue Coreg, Increase Lisinopril to 5 mg daily 2. Pulm- off oxygen, dry cough, encouraged patient to use IS 3. Renal- creatinine has been WNL, remains hypervolemic will continue diuresis with Lasix 4. Fever- patient with low grade temp since yesterday, no leukocytosis present, likely SIRS with atelectasis on CXR 5. Dispo- patient stable, wants to go home today, low grade temp likely atelectasis will d/c home today if okay with Dr. Roxan Hockey   LOS: 9 days    Caleb [name] 02/20/2014  Seen and examined Low grade temp likely atelectasis Home today

## 2014-02-20 NOTE — Progress Notes (Signed)
CARDIAC REHAB PHASE I   PRE:  Rate/Rhythm: 78 sinus  BP:  Supine:   Sitting: 116/82  Standing:    SaO2: 98 RA  MODE:  Ambulation: 550 ft   POST:  Rate/Rhythem: 88 sinus  BP:  Supine:   Sitting: 118/58  Standing:    SaO2: 99 RA  Pt ambulated 550 ft with assist x1 using rolling walker.  Pt tolerated walk well without complaint.  He had no f/u questions from yesterday's education. Alberteen Sam, MA, ACSM RCEP [phone]  Clotilde Dieter

## 2014-02-20 NOTE — Progress Notes (Signed)
Sutures removed. Caleb [name] S 11:17 AM

## 2014-03-11 ENCOUNTER — Telehealth: Payer: Self-pay | Admitting: Physician Assistant

## 2014-03-11 ENCOUNTER — Other Ambulatory Visit: Payer: Self-pay | Admitting: Thoracic Surgery (Cardiothoracic Vascular Surgery)

## 2014-03-11 ENCOUNTER — Encounter: Payer: Self-pay | Admitting: Physician Assistant

## 2014-03-11 ENCOUNTER — Ambulatory Visit (INDEPENDENT_AMBULATORY_CARE_PROVIDER_SITE_OTHER): Payer: BLUE CROSS/BLUE SHIELD | Admitting: Physician Assistant

## 2014-03-11 VITALS — BP 134/66 | HR 70 | Ht 65.0 in | Wt 135.0 lb

## 2014-03-11 DIAGNOSIS — I25709 Atherosclerosis of coronary artery bypass graft(s), unspecified, with unspecified angina pectoris: Secondary | ICD-10-CM

## 2014-03-11 DIAGNOSIS — I255 Ischemic cardiomyopathy: Secondary | ICD-10-CM

## 2014-03-11 DIAGNOSIS — I10 Essential (primary) hypertension: Secondary | ICD-10-CM

## 2014-03-11 DIAGNOSIS — I5021 Acute systolic (congestive) heart failure: Secondary | ICD-10-CM

## 2014-03-11 DIAGNOSIS — E785 Hyperlipidemia, unspecified: Secondary | ICD-10-CM

## 2014-03-11 DIAGNOSIS — I5022 Chronic systolic (congestive) heart failure: Secondary | ICD-10-CM

## 2014-03-11 DIAGNOSIS — I2510 Atherosclerotic heart disease of native coronary artery without angina pectoris: Secondary | ICD-10-CM

## 2014-03-11 LAB — BASIC METABOLIC PANEL
BUN: 8 mg/dL (ref 6–23)
CO2: 27 mEq/L (ref 19–32)
Calcium: 9.9 mg/dL (ref 8.4–10.5)
Chloride: 94 mEq/L — ABNORMAL LOW (ref 96–112)
Creatinine, Ser: 0.88 mg/dL (ref 0.40–1.50)
GFR: 92.29 mL/min (ref >60.00)
Glucose, Bld: 112 mg/dL — ABNORMAL HIGH (ref 70–99)
Potassium: 5 mEq/L (ref 3.5–5.1)
Sodium: 127 mEq/L — ABNORMAL LOW (ref 135–145)

## 2014-03-11 MED ORDER — CARVEDILOL 6.25 MG PO TABS: 9.3750 mg | ORAL_TABLET | Freq: Two times a day (BID) | ORAL | Status: DC

## 2014-03-11 MED ORDER — PRAVASTATIN SODIUM 80 MG PO TABS: 80.0000 mg | ORAL_TABLET | Freq: Every day | ORAL | Status: DC

## 2014-03-11 NOTE — Progress Notes (Signed)
Cardiology Office Note   Date:  03/11/2014   ID:  [name], DOB 01-19-1950, MRN [id number]  PCP:  [name], MD  Cardiologist:  Dr. Jenkins Rouge     Chief Complaint  Patient presents with  . Coronary Artery Disease    POST CABG VISIT     History of Present Illness: Caleb [name] is a 64 y.o. male with a hx of HTN, HL, tobacco abuse.  He was admitted 1/28-2/6 with non-ST elevation myocardial infarction. He initially presented with respiratory distress and was treated for presumed AE COPD. Echocardiogram demonstrated multiple wall motion of the malleus with an EF of 30-35%. LHC demonstrated 3 vessel CAD with an EF of 25%. He was referred for bypass.  He underwent CABG x 3 utilizing LIMA to LAD, RIMA to Distal RCA and SVG to OM with Dr. [name].  Post-op course was uneventful. He remained in sinus rhythm. He returns for follow-up.  He is doing very well since DC.  The patient denies chest pain, shortness of breath, syncope, orthopnea, PND or significant pedal edema.  He has left a dressing on his median sternotomy wound since DC.  Denies erythema or drainage.   Studies/Reports Reviewed Today:  Cardiac Cath 02/12/14 LAD:  mid eccentric high-grade stenosis that appears to represent a ruptured plaque, 70-80% stenosis after D2.  The LAD supplies collaterals to the PDA and distal right coronary.[name] LCx:    occluded and fills late by left to right collaterals. RCA:   Distal 95% stenosis. The PDA is subtotally occluded proximally.  EF:  25%.  Echocardiogram 02/12/14 - Mild concentric hypertrophy. EF 30-35%. Wall motion was normal; Grade 1   diastolic dysfunction.  Akinesis in the basal and mid inferoseptal, inferior,   inferolateral walls, all of the apical sepgments and in the true   apex.  This is suggestive of multivessel disease. - Mild MR - PA peak pressure: 38 mm Hg (S). - Pericardium, extracardiac: There was no pericardial effusion.  Carotid US 02/16/14 Bilateral - 1% to 39%  ICA stenosis   Past Medical History  Diagnosis Date  . Acute respiratory failure with hypoxia   . COPD (chronic obstructive pulmonary disease) 02/11/2014  . Acute systolic congestive heart failure 02/11/2014  . Tobacco abuse   . Essential hypertension   . Hypercholesteremia   . 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 Left heart catheterization with coronary angiogram N/A 02/12/2014    Procedure: LEFT HEART CATHETERIZATION WITH CORONARY ANGIOGRAM;  Surgeon: Sinclair Grooms, MD;  Location: [facility] CATH LAB;  Service: Cardiovascular;  Laterality: N/A;  . Coronary artery bypass graft N/A 02/17/2014    Procedure: CORONARY ARTERY BYPASS GRAFTING (CABG);  Surgeon: Rexene Alberts, MD;  Location: [facility];  Service: Open Heart Surgery;  Laterality: N/A;  Times 3 using bilateral mammary arteries and endoscopically harvested right saphenous vein  . Intraoperative transesophageal echocardiogram N/A 02/17/2014    Procedure: INTRAOPERATIVE TRANSESOPHAGEAL ECHOCARDIOGRAM;  Surgeon: Rexene Alberts, MD;  Location: [facility];  Service: Open Heart Surgery;  Laterality: N/A;     Current Outpatient Prescriptions  Medication Sig Dispense Refill  . aspirin EC 325 MG EC tablet Take 1 tablet (325 mg total) by mouth daily. 30 tablet 0  . carvedilol (COREG) 6.25 MG tablet Take 1 tablet (6.25 mg total) by mouth 2 (two)  times daily with a meal. 60 tablet 3  . lisinopril (PRINIVIL,ZESTRIL) 5 MG tablet Take 1 tablet (5 mg total) by mouth daily. 30 tablet 3  . oxyCODONE (OXY IR/ROXICODONE) 5 MG immediate release tablet Take 1-2 tablets (5-10 mg total) by mouth every 3 (three) hours as needed for severe pain. 30 tablet 0  . pravastatin (PRAVACHOL) 40 MG tablet Take 40 mg by mouth daily.    [name] spironolactone (ALDACTONE) 25 MG tablet Take 25 mg by mouth daily.    . traMADol (ULTRAM) 50 MG tablet Take 1-2 tablets (50-100 mg total) by  mouth every 4 (four) hours as needed for moderate pain. 30 tablet 0   No current facility-administered medications for this visit.    Allergies:   Review of patient's allergies indicates no known allergies.    Social History:  The patient  reports that he has been smoking Cigarettes.  He does not have any smokeless tobacco history on file. He reports that he drinks alcohol.   Family History:  The patient's family history includes Cancer in his sister; Diabetes in his brother and mother; Heart attack in his brother and mother. There is no history of Stroke.    ROS:   Please see the history of present illness.   Review of Systems  Constitution: Negative for chills and fever.  Respiratory: Negative for cough.   Hematologic/Lymphatic: Negative for bleeding problem.  Gastrointestinal: Negative for diarrhea and vomiting.  All other systems reviewed and are negative.    PHYSICAL EXAM: VS:  BP 134/66 mmHg  Pulse 70  Ht 5\' 5"  (1.651 m)  Wt 135 lb (61.236 kg)  BMI 22.47 kg/m2    Wt Readings from Last 3 Encounters:  02/20/14 147 lb 14.9 oz (67.1 kg)     GEN: Well nourished, well developed, in no acute distress HEENT: normal Neck: no JVD, no masses Cardiac:  Normal S1/S2, RRR; no murmur, no rubs or gallops, no edema  Chest:  Median sternotomy wound well healed.  Gauze dressing on lower portion of incision removed and scab formation noted. No erythema or drainage. Respiratory:  clear to auscultation bilaterally, no wheezing, rhonchi or rales. GI: soft, nontender, nondistended, + BS MS: no deformity or atrophy Skin: warm and dry  Neuro:  CNs II-XII intact, Strength and sensation are intact Psych: Normal affect   EKG:  EKG is ordered today.  It demonstrates:   NSR, HR 70, lateral TWI   Recent Labs: 02/11/2014: B Natriuretic Peptide 824.8*; TSH 1.427 02/14/2014: ALT 19 02/18/2014: Magnesium 2.4 02/19/2014: BUN 8; Creatinine 0.74; Hemoglobin 8.3*; Platelets 102*; Potassium 3.7; Sodium  130*    Lipid Panel    Component Value Date/Time   CHOL 218* 02/14/2014 0243   TRIG 143 02/14/2014 0243   HDL 50 02/14/2014 0243   CHOLHDL 4.4 02/14/2014 0243   VLDL 29 02/14/2014 0243   LDLCALC 139* 02/14/2014 0243      ASSESSMENT AND PLAN:  1.  CAD s/p CABG:  Progressing well since DC from the hospital.      -  Continue ASA, beta blocker, statin, ACEI.    -  Refer to cardiac rehab.    -  FMLA paperwork completed at [employer] today.  2.  Ischemic Cardiomyopathy:  Continue beta blocker, ACEI, Spironolactone.    -  Increase Coreg to 9.375 mg Twice daily     -  BMET today.    -  Arrange FU echo 3 mos post MI/CABG. 3.  Chronic Systolic CHF:  No evidence of volume excess.  He is NYHA 2-2b.  4.  HTN:  Fair control.  Medication adjustments as noted above.  5.  Hyperlipidemia:  He should be on high dose statin.  LDL at the time of admission was 139.  He tells me that he had significant myalgias with Lipitor.      -  Increase Pravastatin to 80 mg QHS.    -  Check Lipids and LFTs in 6 weeks.   6.  Tobacco Abuse:  He has quit smoking!   Current medicines are reviewed at length with the patient today.  The patient does not have concerns regarding medicines.  The following changes have been made:  As above.   Labs/ tests ordered today include:  Orders Placed This Encounter  Procedures  . Basic Metabolic Panel (BMET)  . Hepatic function panel  . Lipid Profile  . EKG 12-Lead     Disposition:   FU with Dr. Jenkins Rouge  in 6 weeks.   Signed, Versie Starks, MHS 03/11/2014 11:28 AM    [facility] [address], [place], [place]  [address] Phone: [phone]; Fax: [phone]

## 2014-03-11 NOTE — Patient Instructions (Signed)
Your physician has recommended you make the following change in your medication:    START TAKING COREG 9.375 MG TWICE A DAY   START TAKING  PRAVASTATIN 80 MG ONCE DAY    LABS TODAY BMET   LABS IN 6 WEEKS LFT AND LIPIDS  ORDERS NEED TO BE LINKED TO APPOINTMENT TIME   FOLLOW UP WITH DR Johnsie Cancel IN 6 WEEKS OR NEXT AVAILABLE APPOINTMENT    CARDIAC REHAB REFERRAL HAS BEEN SENT IN FOR YOU AND YOU WILL CONTACTED BY A CARDIAC REHAB [facility] FOR FURTHER PROCESSING

## 2014-03-11 NOTE — Telephone Encounter (Signed)
Please schedule follow up echo after 05/18/2014 (90 days post CABG) to reassess EF. Order in chart. Richardson Dopp, PA-C   03/11/2014 3:30 PM

## 2014-03-12 ENCOUNTER — Telehealth: Payer: Self-pay | Admitting: *Deleted

## 2014-03-12 DIAGNOSIS — I5021 Acute systolic (congestive) heart failure: Secondary | ICD-10-CM

## 2014-03-12 NOTE — Telephone Encounter (Signed)
pt notified about lab results with verbal understanding. Pt advised to limit fluid intake to < 50 oz daily due to Na low 127. BMET 3/11. Pt agreeable to Plan of care.

## 2014-03-15 ENCOUNTER — Ambulatory Visit: Payer: Self-pay | Admitting: Thoracic Surgery (Cardiothoracic Vascular Surgery)

## 2014-03-15 ENCOUNTER — Ambulatory Visit (INDEPENDENT_AMBULATORY_CARE_PROVIDER_SITE_OTHER): Payer: Self-pay | Admitting: Physician Assistant

## 2014-03-15 ENCOUNTER — Ambulatory Visit
Admission: RE | Admit: 2014-03-15 | Discharge: 2014-03-15 | Disposition: A | Discharge status: Home / Self Care | Payer: BLUE CROSS/BLUE SHIELD | Source: Ambulatory Visit | Attending: Thoracic Surgery (Cardiothoracic Vascular Surgery) | Admitting: Thoracic Surgery (Cardiothoracic Vascular Surgery)

## 2014-03-15 VITALS — BP 111/64 | HR 75 | Resp 20 | Ht 65.0 in | Wt 135.0 lb

## 2014-03-15 DIAGNOSIS — Z951 Presence of aortocoronary bypass graft: Secondary | ICD-10-CM

## 2014-03-15 DIAGNOSIS — I5021 Acute systolic (congestive) heart failure: Secondary | ICD-10-CM

## 2014-03-15 NOTE — Progress Notes (Signed)
[address]       [place] [address]             [phone]          HPI: Patient returns for routine postoperative follow-up having undergone CABG x 3 by Dr. [name] on 02/17/2014.   The patient's postoperative course was generally uneventful and he was discharged home on 02/20/2014 in good condition.  Since hospital discharge, the patient has continued to progress well.  He is ambulating 1-2 times daily and denies shortness of breath or dizziness.  He denies chest pain and is not taking any pain medication at this point.  He denies lower extremity edema.  He saw Richardson Dopp, PA-C last week and at that point his beta blocker and statin doses were titrated up for his ischemic cardiomyopathy and hypertension.  The patient states he is tolerating these changes well.   He was noted on labs to have a mild hyponatremia and was encouraged to limit his fluid intake.  He is scheduled to follow up with Dr. Johnsie Cancel next month.    Current Outpatient Prescriptions  Medication Sig Dispense Refill  . aspirin EC 325 MG EC tablet Take 1 tablet (325 mg total) by mouth daily. 30 tablet 0  . carvedilol (COREG) 6.25 MG tablet Take 1.5 tablets (9.375 mg total) by mouth 2 (two) times daily. 90 tablet 6  . lisinopril (PRINIVIL,ZESTRIL) 5 MG tablet Take 1 tablet (5 mg total) by mouth daily. 30 tablet 3  . oxyCODONE (OXY IR/ROXICODONE) 5 MG immediate release tablet Take 1-2 tablets (5-10 mg total) by mouth every 3 (three) hours as needed for severe pain. 30 tablet 0  . pravastatin (PRAVACHOL) 80 MG tablet Take 1 tablet (80 mg total) by mouth daily. 30 tablet 6  . spironolactone (ALDACTONE) 25 MG tablet Take 25 mg by mouth daily.    . traMADol (ULTRAM) 50 MG tablet Take 1-2 tablets (50-100 mg total) by mouth every 4 (four) hours as needed for moderate pain. 30 tablet 0   No current facility-administered medications for this visit.     Physical Exam: BP 111/64 HR 75 Resp 20 Wounds:  Sternal and right leg EVH incisions are healing well with no drainage or erythema. No sternal instability. Heart: regular rate and rhythm Lungs: Clear to auscultation Extremities: No lower extremity edema   Diagnostic Tests: Chest xray: Dg Chest 2 View  03/15/2014   CLINICAL DATA:  Status post CABG on February 17, 2014, asymptomatic today.  EXAM: CHEST  2 VIEW  COMPARISON:  PA and lateral chest x-ray of February 19, 2014  FINDINGS: The lungs are well-expanded and clear. Traces of pleural fluid blunt the costophrenic angles bilaterally. This is improved since the previous study. The heart is normal in size. The pulmonary vascularity is normal. The mediastinum is normal in width. There is 7 intact sternal wires present. No acute bony abnormality is demonstrated. There is old deformity of the lateral aspect of the left ninth rib.  IMPRESSION: COPD and trace pleural effusions. There is no CHF nor other acute cardiopulmonary abnormality.   Electronically Signed   By: David  Martinique   On: 03/15/2014 14:51       Assessment/Plan: Mr. Mcgeehan is doing well status post CABG x 3. His blood pressures are stable today after increase in his Coreg dose.  He is progressing well with mobility and is encouraged to increase his activity daily.  He has been contacted by  outpatient cardiac rehab and plans to enroll.  The patient states he has quit smoking. He may begin driving at this point.  We will see him back in 1 month for follow up.

## 2014-03-15 NOTE — Telephone Encounter (Signed)
pt aware needs to have repeat echo after 05/18/14 90 from CABG, to reassess EF. Pt aware [facility] will call and schedule appt.

## 2014-03-26 ENCOUNTER — Other Ambulatory Visit (INDEPENDENT_AMBULATORY_CARE_PROVIDER_SITE_OTHER): Payer: BLUE CROSS/BLUE SHIELD | Admitting: *Deleted

## 2014-03-26 ENCOUNTER — Telehealth: Payer: Self-pay | Admitting: *Deleted

## 2014-03-26 DIAGNOSIS — I5021 Acute systolic (congestive) heart failure: Secondary | ICD-10-CM

## 2014-03-26 LAB — BASIC METABOLIC PANEL
BUN: 7 mg/dL (ref 6–23)
CO2: 28 mEq/L (ref 19–32)
Calcium: 9.8 mg/dL (ref 8.4–10.5)
Chloride: 96 mEq/L (ref 96–112)
Creatinine, Ser: 0.86 mg/dL (ref 0.40–1.50)
GFR: 94.76 mL/min (ref >60.00)
Glucose, Bld: 112 mg/dL — ABNORMAL HIGH (ref 70–99)
Potassium: 4.5 mEq/L (ref 3.5–5.1)
Sodium: 130 mEq/L — ABNORMAL LOW (ref 135–145)

## 2014-03-26 NOTE — Telephone Encounter (Signed)
pt notified about lab results with verbal understanding  

## 2014-04-01 ENCOUNTER — Encounter (HOSPITAL_COMMUNITY)
Admission: RE | Admit: 2014-04-01 | Discharge: 2014-04-01 | Disposition: A | Discharge status: Home / Self Care | Payer: BLUE CROSS/BLUE SHIELD | Source: Ambulatory Visit | Attending: Cardiovascular Disease | Admitting: Cardiovascular Disease

## 2014-04-01 DIAGNOSIS — I252 Old myocardial infarction: Secondary | ICD-10-CM | POA: Insufficient documentation

## 2014-04-01 DIAGNOSIS — Z951 Presence of aortocoronary bypass graft: Secondary | ICD-10-CM | POA: Insufficient documentation

## 2014-04-01 DIAGNOSIS — Z5189 Encounter for other specified aftercare: Secondary | ICD-10-CM | POA: Insufficient documentation

## 2014-04-01 NOTE — Progress Notes (Signed)
Cardiac Rehab Medication Review by a Pharmacist  Does the patient  feel that his/her medications are working for him/her?  yes  Has the patient been experiencing any side effects to the medications prescribed?  no  Does the patient measure his/her own blood pressure or blood glucose at home?  yes   Does the patient have any problems obtaining medications due to transportation or finances?   no  Understanding of regimen: excellent Understanding of indications: excellent Potential of compliance: excellent    Pharmacist comments: Pt has a good understanding of medications and indications for use.  He complains of a dry cough a few days ago but not currently.  I told him to monitor for improvement but that if it persists to contact his provider to switch lisinopril.  Will contact provider regarding full dose ASA, lack of SL NTG, and consideration of switch from pravastatin to rosuvastatin (intolerance to atorvastatin in past).  Drucie Opitz, PharmD Clinical Pharmacy Resident Pager: [phone] 04/01/2014 8:57 AM

## 2014-04-05 ENCOUNTER — Encounter (HOSPITAL_COMMUNITY)
Admission: RE | Admit: 2014-04-05 | Discharge: 2014-04-05 | Disposition: A | Discharge status: Home / Self Care | Payer: BLUE CROSS/BLUE SHIELD | Source: Ambulatory Visit | Attending: Cardiovascular Disease | Admitting: Cardiovascular Disease

## 2014-04-05 DIAGNOSIS — Z5189 Encounter for other specified aftercare: Secondary | ICD-10-CM | POA: Diagnosis not present

## 2014-04-05 DIAGNOSIS — Z951 Presence of aortocoronary bypass graft: Secondary | ICD-10-CM | POA: Diagnosis not present

## 2014-04-05 DIAGNOSIS — I252 Old myocardial infarction: Secondary | ICD-10-CM | POA: Diagnosis not present

## 2014-04-05 NOTE — Progress Notes (Addendum)
Pt started cardiac rehab today.  Pt tolerated light exercise without difficulty. Telemetry rhythm Sinus. Vital signs stable. PHQ=0. Caleb [name]'s short and long term goals are to get back to his normal activities and to get back to work. .Will continue to monitor the patient throughout  the program.

## 2014-04-06 ENCOUNTER — Telehealth: Payer: Self-pay | Admitting: *Deleted

## 2014-04-06 MED ORDER — ASPIRIN 81 MG PO TBEC: 81.0000 mg | DELAYED_RELEASE_TABLET | Freq: Every day | ORAL | Status: DC

## 2014-04-06 MED ORDER — ATORVASTATIN CALCIUM 40 MG PO TABS: 40.0000 mg | ORAL_TABLET | Freq: Every day | ORAL | Status: DC

## 2014-04-06 NOTE — Telephone Encounter (Signed)
-----   Message from Rowe Pavy, RN sent at 04/05/2014  8:12 AM EDT ----- Regarding: FW: medication review Hi Nabil Bubolz  Forward this to you - do you work with dr. Johnsie Cancel? I sent this only as a FYI based upon the pharmacist recommendations.  Thanks  Carlette  ----- Message -----    From: Josue Hector, MD    Sent: 04/04/2014  10:44 AM      To: Rowe Pavy, RN Subject: RE: medication review                          Ok to change to baby aspirin and crestor 20 if crestor too expensive can use lipitor 40 mg  F/u lipids and liver in 3 months  ----- Message -----    From: Rowe Pavy, RN    Sent: 04/01/2014   8:59 AM      To: Josue Hector, MD Subject: medication review                               Dr. Johnsie Cancel, Pt in today for orientation to cardiac rehab s/p 12/12/13 nstemi and 02/17/14 cabg x 3 with lima.  As a part of the orientation appointment, pt's receive a review of their medications with a pharmacist.  Pt spoke to [employer]. Tegan asked me to in basket you with the following recommendations: (see his note in epic)  Pt is taking full strength ASA ? Decrease to baby asa 81 mg Pt is taking pravastatin 80 mg ? Switch to Crestor 20-40mg   Thanks so much for your review  Maurice Small RN

## 2014-04-06 NOTE — Telephone Encounter (Signed)
PT  AWARE  UNABLE  TO  TAKE  CRESTOR  MEDS  NEED TO BE  GENERIC  WILL TRY   ATORVASTATIN AND  DECREASE  ASA TO 81 MG ./CY

## 2014-04-07 ENCOUNTER — Encounter (HOSPITAL_COMMUNITY)
Admission: RE | Admit: 2014-04-07 | Discharge: 2014-04-07 | Disposition: A | Discharge status: Home / Self Care | Payer: BLUE CROSS/BLUE SHIELD | Source: Ambulatory Visit | Attending: Cardiovascular Disease | Admitting: Cardiovascular Disease

## 2014-04-07 DIAGNOSIS — Z5189 Encounter for other specified aftercare: Secondary | ICD-10-CM | POA: Diagnosis not present

## 2014-04-09 ENCOUNTER — Encounter (HOSPITAL_COMMUNITY)
Admission: RE | Admit: 2014-04-09 | Discharge: 2014-04-09 | Disposition: A | Discharge status: Home / Self Care | Payer: BLUE CROSS/BLUE SHIELD | Source: Ambulatory Visit | Attending: Cardiovascular Disease | Admitting: Cardiovascular Disease

## 2014-04-09 DIAGNOSIS — Z5189 Encounter for other specified aftercare: Secondary | ICD-10-CM | POA: Diagnosis not present

## 2014-04-12 ENCOUNTER — Encounter (HOSPITAL_COMMUNITY)
Admission: RE | Admit: 2014-04-12 | Discharge: 2014-04-12 | Disposition: A | Discharge status: Home / Self Care | Payer: BLUE CROSS/BLUE SHIELD | Source: Ambulatory Visit | Attending: Cardiovascular Disease | Admitting: Cardiovascular Disease

## 2014-04-12 DIAGNOSIS — Z5189 Encounter for other specified aftercare: Secondary | ICD-10-CM | POA: Diagnosis not present

## 2014-04-14 ENCOUNTER — Encounter (HOSPITAL_COMMUNITY)
Admission: RE | Admit: 2014-04-14 | Discharge: 2014-04-14 | Disposition: A | Discharge status: Home / Self Care | Payer: BLUE CROSS/BLUE SHIELD | Source: Ambulatory Visit | Attending: Cardiovascular Disease | Admitting: Cardiovascular Disease

## 2014-04-14 DIAGNOSIS — Z5189 Encounter for other specified aftercare: Secondary | ICD-10-CM | POA: Diagnosis not present

## 2014-04-16 ENCOUNTER — Encounter (HOSPITAL_COMMUNITY)
Admission: RE | Admit: 2014-04-16 | Discharge: 2014-04-16 | Disposition: A | Discharge status: Home / Self Care | Payer: BLUE CROSS/BLUE SHIELD | Source: Ambulatory Visit | Attending: Cardiovascular Disease | Admitting: Cardiovascular Disease

## 2014-04-16 DIAGNOSIS — Z951 Presence of aortocoronary bypass graft: Secondary | ICD-10-CM | POA: Diagnosis not present

## 2014-04-16 DIAGNOSIS — I252 Old myocardial infarction: Secondary | ICD-10-CM | POA: Insufficient documentation

## 2014-04-16 DIAGNOSIS — Z5189 Encounter for other specified aftercare: Secondary | ICD-10-CM | POA: Insufficient documentation

## 2014-04-19 ENCOUNTER — Encounter: Payer: Self-pay | Admitting: Thoracic Surgery (Cardiothoracic Vascular Surgery)

## 2014-04-19 ENCOUNTER — Encounter (HOSPITAL_COMMUNITY): Admission: RE | Admit: 2014-04-19 | Payer: BLUE CROSS/BLUE SHIELD | Source: Ambulatory Visit

## 2014-04-19 ENCOUNTER — Ambulatory Visit (INDEPENDENT_AMBULATORY_CARE_PROVIDER_SITE_OTHER): Payer: Self-pay | Admitting: Thoracic Surgery (Cardiothoracic Vascular Surgery)

## 2014-04-19 VITALS — BP 134/69 | HR 80 | Resp 20 | Ht 64.0 in

## 2014-04-19 DIAGNOSIS — Z951 Presence of aortocoronary bypass graft: Secondary | ICD-10-CM

## 2014-04-19 NOTE — Patient Instructions (Addendum)
The patient should continue to avoid any heavy lifting or strenuous use of arms or shoulders for at least a total of three months from the time of surgery.  Otherwise the patient may resume unrestricted physical activity.  The patient is encouraged to participate in the outpatient cardiac rehab program beginning as soon as practical.   The patient should make every effort to continue to refrain from smoking permanently.

## 2014-04-19 NOTE — Progress Notes (Signed)
      [address]       [place], [address]             [phone]     CARDIOTHORACIC SURGERY OFFICE NOTE  Referring Provider is Belva Crome, MD  Primary Cardiologist is Jenkins Rouge, MD PCP is [name], MD   HPI:  Patient is a 64 year old male who returns for routine follow-up approximately 2 months status post coronary artery bypass grafting 3 using bilateral internal mammary arteries on 02/17/2014 for severe three-vessel artery disease status post acute non-ST segment elevation myocardial infarction.  The patient's postoperative recovery was uncomplicated and he was discharged from the hospital on the third postoperative day.  Since hospital discharge she was seen in follow-up in our office on 03/15/2014 at which time he was doing well. He returns for routine follow-up today. He has been participating in outpatient cardiac rehabilitation program and reports excellent progress. He continues to abstain from any tobacco smoking. He reports minimal residual soreness in his chest. He denies any symptoms of exertional shortness of breath or chest discomfort. He wants to go back to work. He feels well.   Current Outpatient Prescriptions  Medication Sig Dispense Refill  . aspirin 81 MG EC tablet Take 1 tablet (81 mg total) by mouth daily.    [name] atorvastatin (LIPITOR) 40 MG tablet Take 1 tablet (40 mg total) by mouth daily. 90 tablet 3  . carvedilol (COREG) 6.25 MG tablet Take 1.5 tablets (9.375 mg total) by mouth 2 (two) times daily. 90 tablet 6  . lisinopril (PRINIVIL,ZESTRIL) 5 MG tablet Take 1 tablet (5 mg total) by mouth daily. 30 tablet 3  . oxyCODONE (OXY IR/ROXICODONE) 5 MG immediate release tablet Take 1-2 tablets (5-10 mg total) by mouth every 3 (three) hours as needed for severe pain. 30 tablet 0  . spironolactone (ALDACTONE) 25 MG tablet Take 25 mg by mouth daily.    . traMADol (ULTRAM) 50 MG tablet Take 1-2 tablets (50-100 mg total) by mouth every 4  (four) hours as needed for moderate pain. 30 tablet 0   No current facility-administered medications for this visit.      Physical Exam:   BP 134/69 mmHg  Pulse 80  Resp 20  Ht 5\' 4"  (1.626 m)  SpO2 98%  General:  Well-appearing  Chest:   Clear  CV:   Regular rate and rhythm without murmur  Incisions:  Healing nicely, sternum is stable  Abdomen:  Soft and nontender  Extremities:  Warm and well-perfused  Diagnostic Tests:  n/a   Impression:  Patient is doing very well approximately 2 months status post coronary artery bypass grafting 3 using bilateral internal mammary arteries.  Plan:  I have encouraged the patient to continue to gradually increase his physical activity as tolerated. I've reminded him to refrain from any heavy lifting or strenuous use of his arms or shoulders for least another month or so.  As long as he can avoid such activities I think he could return to work at any time. We have discussed how important it will remain for him to continue to abstain from all tobacco use.  We have not recommended any changes to his current medications at this time.  All of his questions have been addressed. The patient will return for routine follow-up in 10 months, approximately 1 year following his original surgery.   Valentina Gu. [name], MD 04/19/2014 11:58 AM

## 2014-04-21 ENCOUNTER — Encounter (HOSPITAL_COMMUNITY): Payer: BLUE CROSS/BLUE SHIELD

## 2014-04-23 ENCOUNTER — Encounter (HOSPITAL_COMMUNITY): Payer: BLUE CROSS/BLUE SHIELD

## 2014-04-23 ENCOUNTER — Ambulatory Visit (INDEPENDENT_AMBULATORY_CARE_PROVIDER_SITE_OTHER): Payer: BLUE CROSS/BLUE SHIELD | Admitting: Cardiovascular Disease

## 2014-04-23 ENCOUNTER — Other Ambulatory Visit (INDEPENDENT_AMBULATORY_CARE_PROVIDER_SITE_OTHER): Payer: BLUE CROSS/BLUE SHIELD | Admitting: *Deleted

## 2014-04-23 ENCOUNTER — Encounter: Payer: Self-pay | Admitting: Cardiovascular Disease

## 2014-04-23 ENCOUNTER — Telehealth: Payer: Self-pay | Admitting: *Deleted

## 2014-04-23 VITALS — BP 130/62 | HR 69 | Ht 65.0 in | Wt 139.4 lb

## 2014-04-23 DIAGNOSIS — I10 Essential (primary) hypertension: Secondary | ICD-10-CM

## 2014-04-23 DIAGNOSIS — E7800 Pure hypercholesterolemia, unspecified: Secondary | ICD-10-CM

## 2014-04-23 DIAGNOSIS — R931 Abnormal findings on diagnostic imaging of heart and coronary circulation: Secondary | ICD-10-CM | POA: Diagnosis not present

## 2014-04-23 DIAGNOSIS — Z736 Limitation of activities due to disability: Secondary | ICD-10-CM

## 2014-04-23 DIAGNOSIS — I2510 Atherosclerotic heart disease of native coronary artery without angina pectoris: Secondary | ICD-10-CM | POA: Diagnosis not present

## 2014-04-23 LAB — HEPATIC FUNCTION PANEL
ALT: 6 U/L (ref 0–53)
AST: 11 U/L (ref 0–37)
Albumin: 3.7 g/dL (ref 3.5–5.2)
Alkaline Phosphatase: 79 U/L (ref 39–117)
Bilirubin, Direct: 0 mg/dL (ref 0.0–0.3)
Total Bilirubin: 0.3 mg/dL (ref 0.2–1.2)
Total Protein: 7 g/dL (ref 6.0–8.3)

## 2014-04-23 LAB — LIPID PANEL
Cholesterol: 144 mg/dL (ref 0–200)
HDL: 30 mg/dL — ABNORMAL LOW (ref >39.00)
LDL Cholesterol: 94 mg/dL (ref 0–99)
NonHDL: 114
Total CHOL/HDL Ratio: 5
Triglycerides: 101 mg/dL (ref 0.0–149.0)
VLDL: 20.2 mg/dL (ref 0.0–40.0)

## 2014-04-23 MED ORDER — ATORVASTATIN CALCIUM 80 MG PO TABS: 80.0000 mg | ORAL_TABLET | Freq: Every day | ORAL | Status: DC

## 2014-04-23 NOTE — Addendum Note (Signed)
Addended by: Eulis Foster on: 04/23/2014 10:15 AM   Modules accepted: Orders

## 2014-04-23 NOTE — Telephone Encounter (Signed)
pt notified of lab reuslts and to increase lipitor to 80 mg daily; new Rx sent in today. Pt states he will have to call back to schedule FLP/LFT since he was out at a store right now.

## 2014-04-23 NOTE — Progress Notes (Signed)
Patient ID: Caleb [name], male   DOB: May 29, 1950, 64 y.o.   MRN: [id number]   Patient is a 64 y.o. old male patient of [name]  post coronary artery bypass grafting 3 using bilateral internal mammary arteries on 02/17/2014 for severe three-vessel artery disease status post acute non-ST segment elevation myocardial infarction. The patient's postoperative recovery was uncomplicated and he was discharged from the hospital on the third postoperative day. Since hospital discharge she was seen in follow-up in our office on 03/15/2014 at which time he was doing well. He returns for routine follow-up today. He has been participating in outpatient cardiac rehabilitation program and reports excellent progress. He continues to abstain from any tobacco smoking. He reports minimal residual soreness in his chest. He denies any symptoms of exertional shortness of breath or chest discomfort. He wants to go back to work. He feels well.  Due to see [name] as primary for colonoscopy and labs Going back to work Monday at [employer]  Notes from [name] and [name] make no mention of his preop echo showing EF 30-35%  No mention of life vest in hospital  Discussed need to reevaluate EF post revascularization Prefer cardiac MRI  ROS: Denies fever, malais, weight loss, blurry vision, decreased visual acuity, cough, sputum, SOB, hemoptysis, pleuritic pain, palpitaitons, heartburn, abdominal pain, melena, lower extremity edema, claudication, or rash.  All other systems reviewed and negative  General: Affect appropriate Healthy:  appears stated age 61: normal Neck supple with no adenopathy JVP normal no bruits no thyromegaly Lungs clear with no wheezing and good diaphragmatic motion Heart:  S1/S2 no murmur, no rub, gallop or click Stenoromy well healed  PMI normal Abdomen: benighn, BS positve, no tenderness, no AAA no bruit.  No HSM or HJR Distal pulses intact with no bruits No edema Neuro non-focal Skin  warm and dry venectomy sights legs well healed  No muscular weakness   Current Outpatient Prescriptions  Medication Sig Dispense Refill  . aspirin 81 MG EC tablet Take 1 tablet (81 mg total) by mouth daily.    [name] atorvastatin (LIPITOR) 40 MG tablet Take 1 tablet (40 mg total) by mouth daily. 90 tablet 3  . carvedilol (COREG) 6.25 MG tablet Take 1.5 tablets (9.375 mg total) by mouth 2 (two) times daily. 90 tablet 6  . lisinopril (PRINIVIL,ZESTRIL) 5 MG tablet Take 1 tablet (5 mg total) by mouth daily. 30 tablet 3  . oxyCODONE (OXY IR/ROXICODONE) 5 MG immediate release tablet Take 1-2 tablets (5-10 mg total) by mouth every 3 (three) hours as needed for severe pain. 30 tablet 0  . spironolactone (ALDACTONE) 25 MG tablet Take 25 mg by mouth daily.    . traMADol (ULTRAM) 50 MG tablet Take 1-2 tablets (50-100 mg total) by mouth every 4 (four) hours as needed for moderate pain. 30 tablet 0   No current facility-administered medications for this visit.    Allergies  Review of patient's allergies indicates no known allergies.  Electrocardiogram:  03/11/14  SR IMI  Lateral T wave inversions   Assessment and Plan CAD:  Post CABG  Doing well sternum well healed continue asa and beta blocker DCM:  F/u MRI 8 weeks if EF still less than 35% refer to EP for considering AICD continue ARB/aldactone will Need BMET a week before getting gadolinium Chol :  On statin labs with Dr Redmond Pulling  Smoking:  Continues to abstain since surgery

## 2014-04-23 NOTE — Patient Instructions (Addendum)
Your physician wants you to follow-up in  AFTER  MRI TO SEE DR Blima Singer will receive a reminder letter in the mail two months in advance. If you don't receive a letter, please call our office to schedule the follow-up appointment. Your physician recommends that you continue on your current medications as directed. Please refer to the Current Medication list given to you today. Your physician has requested that you have a cardiac MRI. Cardiac MRI uses a computer to create images of your heart as its beating, producing both still and moving pictures of your heart and major blood vessels. For further information please visit [email or web address] Please follow the instruction sheet given to you today for more information. [place]  [place] physician recommends that you return for lab work in: BMET   [place]  [address]

## 2014-04-26 ENCOUNTER — Encounter (HOSPITAL_COMMUNITY): Payer: BLUE CROSS/BLUE SHIELD

## 2014-04-28 ENCOUNTER — Encounter (HOSPITAL_COMMUNITY): Payer: BLUE CROSS/BLUE SHIELD

## 2014-04-30 ENCOUNTER — Encounter (HOSPITAL_COMMUNITY): Payer: BLUE CROSS/BLUE SHIELD

## 2014-05-03 ENCOUNTER — Encounter: Payer: Self-pay | Admitting: Cardiovascular Disease

## 2014-05-03 ENCOUNTER — Encounter (HOSPITAL_COMMUNITY): Payer: BLUE CROSS/BLUE SHIELD

## 2014-05-04 ENCOUNTER — Telehealth (HOSPITAL_COMMUNITY): Payer: Self-pay | Admitting: *Deleted

## 2014-05-05 ENCOUNTER — Encounter (HOSPITAL_COMMUNITY): Payer: BLUE CROSS/BLUE SHIELD

## 2014-05-05 ENCOUNTER — Encounter: Payer: Self-pay | Admitting: Cardiovascular Disease

## 2014-05-07 ENCOUNTER — Encounter (HOSPITAL_COMMUNITY): Payer: BLUE CROSS/BLUE SHIELD

## 2014-05-10 ENCOUNTER — Encounter (HOSPITAL_COMMUNITY): Payer: BLUE CROSS/BLUE SHIELD

## 2014-05-12 ENCOUNTER — Encounter (HOSPITAL_COMMUNITY): Payer: BLUE CROSS/BLUE SHIELD

## 2014-05-14 ENCOUNTER — Encounter (HOSPITAL_COMMUNITY): Payer: BLUE CROSS/BLUE SHIELD

## 2014-05-17 ENCOUNTER — Encounter (HOSPITAL_COMMUNITY): Payer: BLUE CROSS/BLUE SHIELD

## 2014-05-19 ENCOUNTER — Encounter (HOSPITAL_COMMUNITY): Payer: BLUE CROSS/BLUE SHIELD

## 2014-05-21 ENCOUNTER — Encounter (HOSPITAL_COMMUNITY): Payer: BLUE CROSS/BLUE SHIELD

## 2014-05-21 ENCOUNTER — Ambulatory Visit (HOSPITAL_COMMUNITY)
Admission: RE | Admit: 2014-05-21 | Discharge: 2014-05-21 | Disposition: A | Discharge status: Home / Self Care | Payer: BLUE CROSS/BLUE SHIELD | Source: Ambulatory Visit | Attending: Cardiovascular Disease | Admitting: Cardiovascular Disease

## 2014-05-21 DIAGNOSIS — I2510 Atherosclerotic heart disease of native coronary artery without angina pectoris: Secondary | ICD-10-CM | POA: Diagnosis not present

## 2014-05-21 DIAGNOSIS — Z951 Presence of aortocoronary bypass graft: Secondary | ICD-10-CM | POA: Insufficient documentation

## 2014-05-21 DIAGNOSIS — R931 Abnormal findings on diagnostic imaging of heart and coronary circulation: Secondary | ICD-10-CM

## 2014-05-21 LAB — CREATININE, SERUM
Creatinine, Ser: 0.99 mg/dL (ref 0.61–1.24)
GFR calc Af Amer: >60 mL/min (ref >60)
GFR calc non Af Amer: >60 mL/min (ref >60)

## 2014-05-21 MED ORDER — GADOBENATE DIMEGLUMINE 529 MG/ML IV SOLN
20.0000 mL | Freq: Once | INTRAVENOUS | Status: AC | PRN
  Administered 2014-05-21: 20 mL via INTRAVENOUS

## 2014-05-24 ENCOUNTER — Encounter (HOSPITAL_COMMUNITY): Payer: BLUE CROSS/BLUE SHIELD

## 2014-05-25 ENCOUNTER — Telehealth (HOSPITAL_COMMUNITY): Payer: Self-pay | Admitting: *Deleted

## 2014-05-26 ENCOUNTER — Encounter (HOSPITAL_COMMUNITY): Payer: BLUE CROSS/BLUE SHIELD

## 2014-05-28 ENCOUNTER — Encounter (HOSPITAL_COMMUNITY): Payer: BLUE CROSS/BLUE SHIELD

## 2014-05-31 ENCOUNTER — Other Ambulatory Visit: Payer: Self-pay

## 2014-05-31 ENCOUNTER — Telehealth: Payer: Self-pay

## 2014-05-31 ENCOUNTER — Encounter (HOSPITAL_COMMUNITY): Payer: BLUE CROSS/BLUE SHIELD

## 2014-06-02 ENCOUNTER — Encounter (HOSPITAL_COMMUNITY): Payer: BLUE CROSS/BLUE SHIELD

## 2014-06-04 ENCOUNTER — Encounter (HOSPITAL_COMMUNITY): Payer: BLUE CROSS/BLUE SHIELD

## 2014-06-07 ENCOUNTER — Encounter (HOSPITAL_COMMUNITY): Payer: BLUE CROSS/BLUE SHIELD

## 2014-06-09 ENCOUNTER — Encounter (HOSPITAL_COMMUNITY): Payer: BLUE CROSS/BLUE SHIELD

## 2014-06-11 ENCOUNTER — Encounter (HOSPITAL_COMMUNITY): Payer: BLUE CROSS/BLUE SHIELD

## 2014-06-16 ENCOUNTER — Encounter (HOSPITAL_COMMUNITY): Payer: BLUE CROSS/BLUE SHIELD

## 2014-06-18 ENCOUNTER — Encounter (HOSPITAL_COMMUNITY): Payer: BLUE CROSS/BLUE SHIELD

## 2014-06-21 ENCOUNTER — Encounter (HOSPITAL_COMMUNITY): Payer: BLUE CROSS/BLUE SHIELD

## 2014-06-23 ENCOUNTER — Encounter (HOSPITAL_COMMUNITY): Payer: BLUE CROSS/BLUE SHIELD

## 2014-06-23 ENCOUNTER — Other Ambulatory Visit: Payer: Self-pay | Admitting: Family Medicine

## 2014-06-23 DIAGNOSIS — I739 Peripheral vascular disease, unspecified: Secondary | ICD-10-CM

## 2014-06-24 ENCOUNTER — Ambulatory Visit
Admission: RE | Admit: 2014-06-24 | Discharge: 2014-06-24 | Disposition: A | Discharge status: Home / Self Care | Payer: BLUE CROSS/BLUE SHIELD | Source: Ambulatory Visit | Attending: Family Medicine | Admitting: Family Medicine

## 2014-06-24 DIAGNOSIS — I739 Peripheral vascular disease, unspecified: Secondary | ICD-10-CM

## 2014-06-25 ENCOUNTER — Encounter (HOSPITAL_COMMUNITY): Payer: BLUE CROSS/BLUE SHIELD

## 2014-06-28 ENCOUNTER — Encounter (HOSPITAL_COMMUNITY): Payer: BLUE CROSS/BLUE SHIELD

## 2014-06-30 ENCOUNTER — Encounter (HOSPITAL_COMMUNITY): Payer: BLUE CROSS/BLUE SHIELD

## 2014-07-02 ENCOUNTER — Encounter (HOSPITAL_COMMUNITY): Payer: BLUE CROSS/BLUE SHIELD

## 2014-07-05 ENCOUNTER — Encounter (HOSPITAL_COMMUNITY): Payer: BLUE CROSS/BLUE SHIELD

## 2014-07-07 ENCOUNTER — Encounter (HOSPITAL_COMMUNITY): Payer: BLUE CROSS/BLUE SHIELD

## 2014-07-09 ENCOUNTER — Encounter (HOSPITAL_COMMUNITY): Payer: BLUE CROSS/BLUE SHIELD

## 2014-11-21 ENCOUNTER — Other Ambulatory Visit: Payer: Self-pay | Admitting: Physician Assistant

## 2015-02-28 ENCOUNTER — Ambulatory Visit (INDEPENDENT_AMBULATORY_CARE_PROVIDER_SITE_OTHER): Payer: BLUE CROSS/BLUE SHIELD | Admitting: Thoracic Surgery (Cardiothoracic Vascular Surgery)

## 2015-02-28 ENCOUNTER — Encounter: Payer: Self-pay | Admitting: Thoracic Surgery (Cardiothoracic Vascular Surgery)

## 2015-02-28 VITALS — BP 169/73 | HR 65 | Resp 16 | Ht 65.0 in | Wt 152.5 lb

## 2015-02-28 DIAGNOSIS — I214 Non-ST elevation (NSTEMI) myocardial infarction: Secondary | ICD-10-CM

## 2015-02-28 DIAGNOSIS — I2510 Atherosclerotic heart disease of native coronary artery without angina pectoris: Secondary | ICD-10-CM | POA: Diagnosis not present

## 2015-02-28 DIAGNOSIS — Z951 Presence of aortocoronary bypass graft: Secondary | ICD-10-CM

## 2015-02-28 NOTE — Patient Instructions (Addendum)
Continue all previous medications without any changes at this time  Keep a copy of your medications in your wallet for reference.  Schedule a follow up appointment with your cardiologist (Dr. Johnsie Cancel)  Keep track of your blood pressure and review your findings with your primary care physician (Dr. Redmond Pulling)  Make every effort to stay physically active, get some type of exercise on a regular basis, and stick to a "heart healthy diet".  The long term benefits for regular exercise and a healthy diet are critically important to your overall health and wellbeing.  Stop smoking immediately and permanently.

## 2015-02-28 NOTE — Progress Notes (Addendum)
[address]       [place] [address]             [phone]     CARDIOTHORACIC SURGERY OFFICE NOTE  Referring Provider is Belva Crome, MD  Primary Cardiologist is Jenkins Rouge, MD PCP is [name], MD   HPI:  Patient is a 65 year old male who returns for routine follow-up approximately one year status post coronary artery bypass grafting 3 using bilateral internal mammary arteries on 02/17/2014 for severe three-vessel coronary artery disease status post acute non-ST segment elevation myocardial infarction. The patient's postoperative recovery was uneventful and he was last seen here in our office on 04/19/2014. Shortly after that he was seen in follow-up by Dr. Johnsie Cancel and he underwent a follow-up cardiac MRI which demonstrated excellent recovery of left ventricular systolic function with ejection fraction estimated 56%.  The patient returns for office for routine follow-up today. He reports that he is doing very well. He specifically denies any symptoms of exertional shortness of breath or chest discomfort. He is back at work and back to normal activity without any significant limitations. He states that he is limited only by chronic pain and numbness in his feet related to his long-standing peripheral neuropathy.  Unfortunately he has gone back to smoking.   Current Outpatient Prescriptions  Medication Sig Dispense Refill  . aspirin 81 MG EC tablet Take 1 tablet (81 mg total) by mouth daily.    [name] atorvastatin (LIPITOR) 80 MG tablet Take 1 tablet (80 mg total) by mouth daily. 30 tablet 11  . carvedilol (COREG) 6.25 MG tablet Take 1.5 tablets (9.375 mg total) by mouth 2 (two) times daily. 90 tablet 6  . gabapentin (NEURONTIN) 300 MG capsule Take 300 mg by mouth 3 (three) times daily.    [name] lisinopril (PRINIVIL,ZESTRIL) 5 MG tablet Take 1 tablet (5 mg total) by mouth daily. 30 tablet 3  . spironolactone (ALDACTONE) 25 MG tablet Take 25 mg by mouth daily.      No current facility-administered medications for this visit.      Physical Exam:   BP 169/73 mmHg  Pulse 65  Resp 16  Ht 5\' 5"  (1.651 m)  Wt 152 lb 8 oz (69.174 kg)  BMI 25.38 kg/m2  SpO2 99%  General:  Well-appearing  Chest:   Clear to auscultation  CV:   Regular rate and rhythm without murmur  Incisions:  Completely healed, sternum is stable  Abdomen:  Soft and nontender  Extremities:  Warm and well-perfused  Diagnostic Tests:  CARDIAC MRI  TECHNIQUE: The patient was scanned on a 1.5 Tesla GE magnet. A dedicated cardiac coil was used. Functional imaging was done using Fiesta sequences. 2,3, and 4 chamber views were done to assess for RWMA's. Modified Simpson's rule using a short axis stack was used to calculate an ejection fraction on a dedicated work Conservation officer, nature. The patient received 20 cc of Multihance. After 10 minutes inversion recovery sequences were used to assess for infiltration and scar tissue.  CONTRAST: 20 cc Multihance  FINDINGS: All 4 cardiac chambers were normal in size and function. There was no ASD/VSD. There was no pericardial effusion. The ascending aorta was normal. Patient is s/p sternotomy. There were no discrete RWMA;s. The quantitative EF was 58%. (EDV 73 cc, ESV 30 cc SV 43 cc) Post gadolinium inversion recovery sequences showed a small area of sub-endocardial scar at the inferior apex. The aortic, mitral and tricuspid valves  were normal  IMPRESSION: 1) Normal LV size and function EF 58% This would indicate excellent myocardial recovery post revascularization with CABG  2) Small subendocardial scar in inferior apex on delayed gadolinium images  Jenkins Rouge   Electronically Signed  By: Jenkins Rouge M.D.  On: 05/21/2014 12:29   Impression:  Patient is doing very well approximately one year status post coronary artery bypass grafting. His blood pressure is a little bit elevated here in our office  today. The patient is not certain regarding his current medications and he did not bring a list with him to our office for review.    Plan:  The patient has been counseled that he needs to find a way to stop smoking.  We have not recommended any changes to the patient's current medications at this time. I have suggested that the patient should check his blood pressure intermittently at home and discuss his medication regimen further with his primary care physician, Dr. Redmond Pulling. I have also reminded the patient to make certain that he is scheduled for a routine follow-up appointment at some point with Dr. Johnsie Cancel. All of his questions have been addressed. In the future he will call and return to see [place] as needed.  I spent in excess of 15 minutes during the conduct of this office consultation and >50% of this time involved direct face-to-face encounter with the patient for counseling and/or coordination of their care.   Valentina Gu. [name], MD 02/28/2015 1:28 PM

## 2015-07-03 ENCOUNTER — Other Ambulatory Visit: Payer: Self-pay | Admitting: Cardiovascular Disease

## 2015-08-05 ENCOUNTER — Other Ambulatory Visit: Payer: Self-pay | Admitting: Cardiovascular Disease

## 2015-09-09 ENCOUNTER — Telehealth: Payer: Self-pay | Admitting: Cardiovascular Disease

## 2015-09-09 MED ORDER — ATORVASTATIN CALCIUM 80 MG PO TABS: 80.0000 mg | ORAL_TABLET | Freq: Every day | ORAL | 0 refills | Status: DC

## 2015-09-09 NOTE — Telephone Encounter (Signed)
New message       *STAT* If patient is at the pharmacy, call can be transferred to refill team.   1. Which medications need to be refilled? (please list name of each medication and dose if known)  Atorvastatin 80mg  2. Which pharmacy/location (including street and city if local pharmacy) is medication to be sent to? [facility] on [address]  3. Do they need a 30 day or 90 day supply? [place]

## 2015-11-23 IMAGING — CR DG CHEST 2V
2 series · 2 of 2 positions shown · non-contrast
Comparison: February 18, 2014

CLINICAL DATA: Coronary artery disease. Status post coronary artery
bypass grafting

EXAM:
CHEST  2 VIEW

[chest pa]
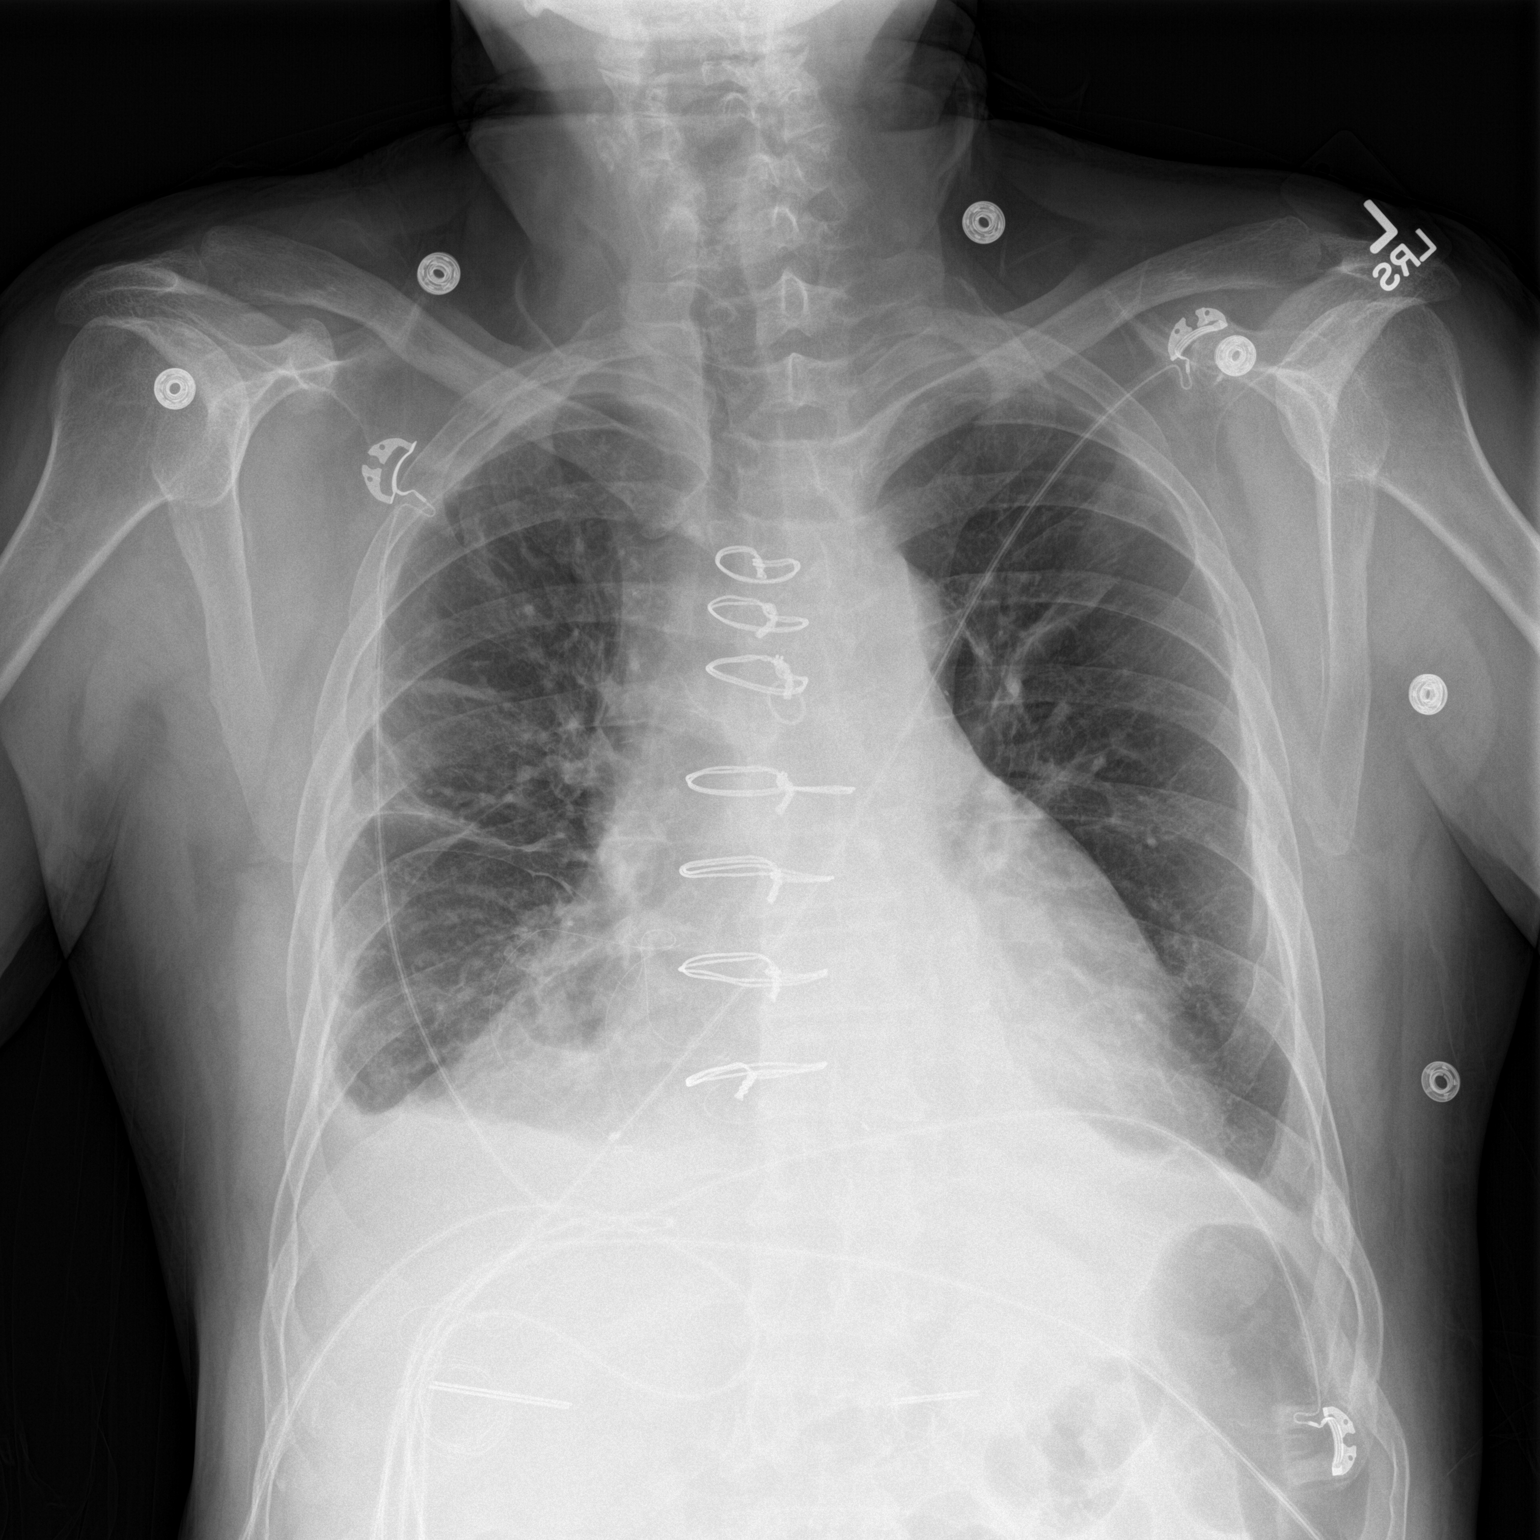

[chest lat]
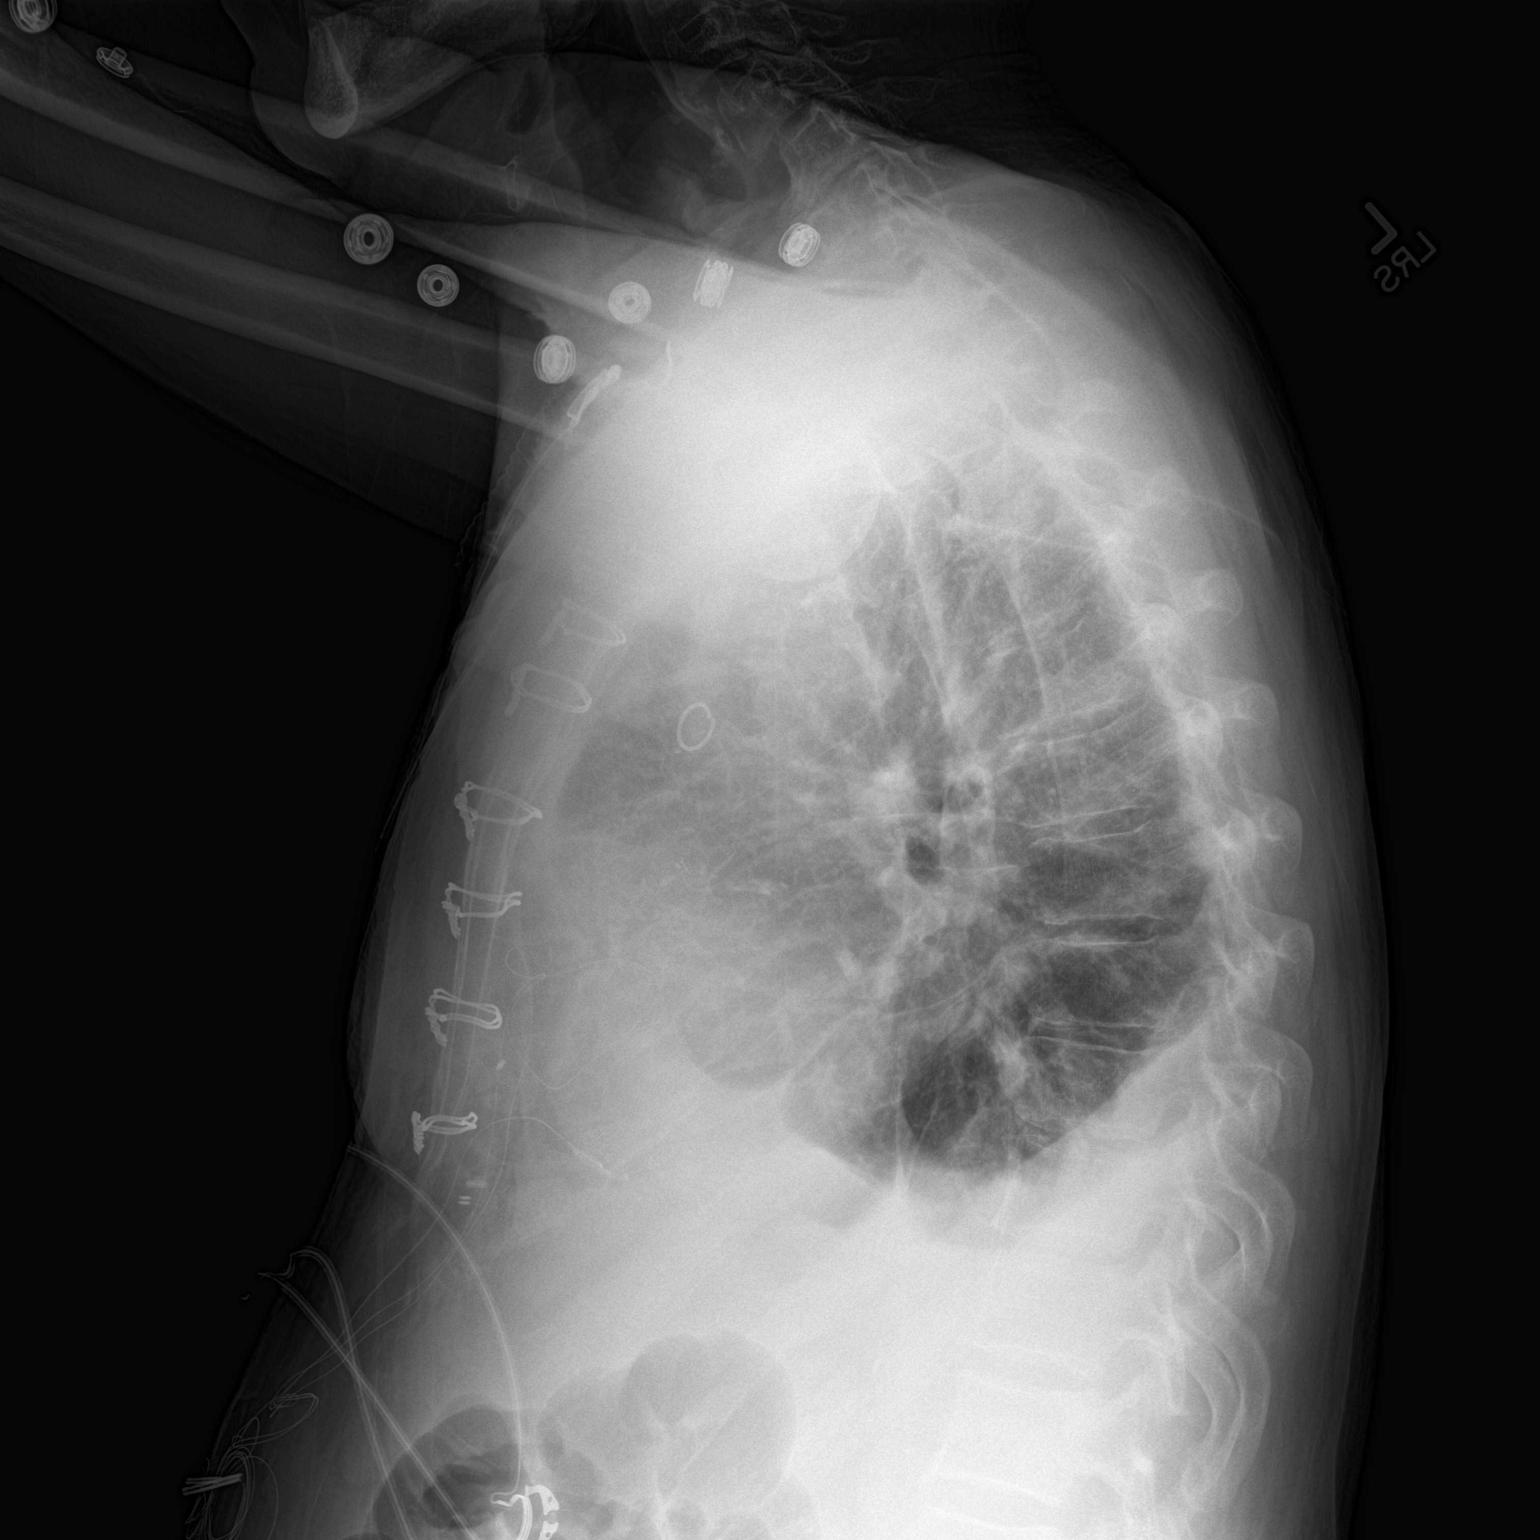

[2 of 2 positions shown; findings below may reference images not displayed]

FINDINGS: Swan-Ganz catheter, bilateral chest tubes, and mediastinal drain
have been removed. There is no appreciable pneumothorax. Temporary
pacemaker wires remain attached to the right heart. There is
underlying emphysema. There is a small right effusion. There is
patchy atelectasis in the right mid lung in both bases. Elsewhere
lungs are clear. Heart is borderline enlarged with pulmonary
vascularity within normal limits. No adenopathy. There is
calcification in the right carotid artery.
IMPRESSION: No pneumothorax. Small right effusion with patchy areas of
atelectatic change, more on the right than on the left. No airspace
consolidation. Heart prominent but stable. Right carotid artery
calcification noted.

## 2015-12-11 NOTE — Progress Notes (Signed)
Patient ID: Caleb [name], male   DOB: [birth year], 65 y.o.   MRN: [id number]   Patient is a 65 y.o. old male patient of Caleb [name]  post coronary artery bypass grafting 3 using bilateral internal mammary arteries on 02/17/2014 for severe three-vessel artery disease status post acute non-ST segment elevation myocardial infarction. The patient's postoperative recovery was uncomplicated and he was discharged from the hospital on the third postoperative day. Since hospital discharge she was seen in follow-up in our office on 03/15/2014 at which time he was doing well. He returns for routine follow-up today. He has been participating in outpatient cardiac rehabilitation program and reports excellent progress. He continues to abstain from any tobacco smoking. He reports minimal residual soreness in his chest. He denies any symptoms of exertional shortness of breath or chest discomfort. He wants to go back to work. He feels well.  Due to see Caleb [name] as primary for colonoscopy and labs Going back to work Monday at [employer]  Notes from Caleb [name] and Caleb [name] make no mention of his preop echo showing EF 30-35%  No mention of life vest in hospital  MRI 05/21/15 reviewed EF recovered to 58% with only small inferior apical SE scar  BP very high today Indicates being compliant with meds   ROS: Denies fever, malais, weight loss, blurry vision, decreased visual acuity, cough, sputum, SOB, hemoptysis, pleuritic pain, palpitaitons, heartburn, abdominal pain, melena, lower extremity edema, claudication, or rash.  All other systems reviewed and negative  General: Affect appropriate Healthy:  appears stated age 65: normal Neck supple with no adenopathy JVP normal no bruits no thyromegaly Lungs clear with no wheezing and good diaphragmatic motion Heart:  S1/S2 no murmur, no rub, gallop or click Stenoromy well healed  PMI normal Abdomen: benighn, BS positve, no tenderness, no AAA ? Renal bruit  no bruit.  No  HSM or HJR Distal pulses intact with no bruits No edema Neuro non-focal Skin warm and dry venectomy sights legs well healed  No muscular weakness   Current Outpatient Prescriptions  Medication Sig Dispense Refill  . aspirin 81 MG EC tablet Take 1 tablet (81 mg total) by mouth daily.    [name] atorvastatin (LIPITOR) 80 MG tablet Take 1 tablet (80 mg total) by mouth daily. 90 tablet 0  . carvedilol (COREG) 6.25 MG tablet Take 1.5 tablets (9.375 mg total) by mouth 2 (two) times daily. 90 tablet 6  . gabapentin (NEURONTIN) 300 MG capsule Take 300 mg by mouth 3 (three) times daily.    [name] losartan (COZAAR) 25 MG tablet Take 25 mg by mouth daily.  1  . spironolactone (ALDACTONE) 25 MG tablet Take 25 mg by mouth daily.     No current facility-administered medications for this visit.     Allergies  Patient has no known allergies.  Electrocardiogram:  03/11/14  SR IMI  Lateral T wave inversions  12/14/15 SR rate 74 LVH inferior lateral  T wave inversions   Assessment and Plan CAD:  Post CABG 02/17/14  Doing well sternum well healed continue asa and beta blocker DCM:  F/u MRI 05/21/14 with EF 58% small SE scar inferior apex great recovery post revascularization no need for AICD Chol :  On statin lipitor increased to 80 mg  Lab Results  Component Value Date   LDLCALC 94 04/23/2014   HTN:  Increase losartan to 100 mg continue beta blocker and diuretic f/u nurse BP check  In 2 weeks Renal Duplex to r/o RAS  Smoking:  Continues to abstain since surgery    F/U me next available after nurse visit for BP  Caleb [name]

## 2015-12-14 ENCOUNTER — Encounter: Payer: Self-pay | Admitting: Cardiovascular Disease

## 2015-12-14 ENCOUNTER — Ambulatory Visit (INDEPENDENT_AMBULATORY_CARE_PROVIDER_SITE_OTHER): Payer: BLUE CROSS/BLUE SHIELD | Admitting: Cardiovascular Disease

## 2015-12-14 ENCOUNTER — Encounter (INDEPENDENT_AMBULATORY_CARE_PROVIDER_SITE_OTHER): Payer: Self-pay

## 2015-12-14 VITALS — BP 210/84 | HR 77 | Ht 65.0 in | Wt 152.1 lb

## 2015-12-14 DIAGNOSIS — I2510 Atherosclerotic heart disease of native coronary artery without angina pectoris: Secondary | ICD-10-CM

## 2015-12-14 DIAGNOSIS — I701 Atherosclerosis of renal artery: Secondary | ICD-10-CM | POA: Diagnosis not present

## 2015-12-14 MED ORDER — ATORVASTATIN CALCIUM 80 MG PO TABS: 80.0000 mg | ORAL_TABLET | Freq: Every day | ORAL | 3 refills | Status: DC

## 2015-12-14 MED ORDER — CARVEDILOL 6.25 MG PO TABS: 9.3750 mg | ORAL_TABLET | Freq: Two times a day (BID) | ORAL | 3 refills | Status: DC

## 2015-12-14 MED ORDER — LOSARTAN POTASSIUM 100 MG PO TABS: 100.0000 mg | ORAL_TABLET | Freq: Every day | ORAL | 3 refills | Status: DC

## 2015-12-14 NOTE — Patient Instructions (Addendum)
Medication Instructions:  Your physician has recommended you make the following change in your medication:  1-Losartan 100 mg by mouth daily- please take in the afternoon  Labwork: NONE  Testing/Procedures: Your physician has requested that you have a renal artery duplex. During this test, an ultrasound is used to evaluate blood flow to the kidneys. Allow one hour for this exam. Do not eat after midnight the day before and avoid carbonated beverages. Take your medications as you usually do.  Follow-Up: Your physician wants you to follow-up in: next available with Dr. Johnsie Cancel.   Your physician recommends that you schedule a follow-up appointment in: 2 weeks with Blood Pressure clinic.    If you need a refill on your cardiac medications before your next appointment, please call your pharmacy.

## 2015-12-27 ENCOUNTER — Ambulatory Visit (INDEPENDENT_AMBULATORY_CARE_PROVIDER_SITE_OTHER): Payer: BLUE CROSS/BLUE SHIELD | Admitting: Pharmacist

## 2015-12-27 VITALS — BP 208/82 | HR 68 | Wt 153.0 lb

## 2015-12-27 DIAGNOSIS — E785 Hyperlipidemia, unspecified: Secondary | ICD-10-CM

## 2015-12-27 DIAGNOSIS — I10 Essential (primary) hypertension: Secondary | ICD-10-CM | POA: Diagnosis not present

## 2015-12-27 MED ORDER — CARVEDILOL 12.5 MG PO TABS: 12.5000 mg | ORAL_TABLET | Freq: Two times a day (BID) | ORAL | 11 refills | Status: DC

## 2015-12-27 NOTE — Patient Instructions (Addendum)
Increase carvedilol to 12.5mg  twice a day. You can double up and take 2 tablets twice a day until you run out of your current prescription. I sent in a prescription for the 12.5mg  tablet strength to start taking 1 tablet twice a day after.  Cut back on soup and look for a low sodium option.  I will call you tomorrow with lab results - we will plan to increase your spironolactone dose if lab work is stable. We would need to recheck labs in 1 week.  Follow up in blood pressure clinic in 2 weeks.

## 2015-12-27 NOTE — Progress Notes (Signed)
Patient ID: Caleb [name]                 DOB: May 24, 1950                      MRN: [id number]     HPI: Caleb [name] is a 65 y.o. male referred by Dr. Johnsie Cancel to HTN clinic. PMH is significant for HTN, CABG x3 vessels in February 2016 for severe CAD s/p NSTEMI. LVEF improved in May 2017 to 58%. At last OV with Dr Johnsie Cancel 2 weeks ago, patient's BP was elevated to 210/84 and losartan was increased from 25mg  to 100mg . Pt scheduled for renal ultrasound on 12/26 to r/o renal artery stenosis. Patient presents today for follow up.  Pt reports tolerating higher dose of losartan. Reports compliance with all of his medications. He denies symptoms of dizziness or headache. He has checked his BP once since the dose increase of losartan 2 weeks ago, reports his systolic reading was in the 170s. States he does not have any symptoms when his BP is elevated over 200. Pt eats a high sodium diet with soup every day - either ramen or spaghettio's. Each serving of soup has 600mg  - 1000mg  of sodium in it.  Pt is also on atorvastatin 80mg  daily post ACS. Most recent lipid panel 04/2014 showed LDL 94 and atorvastatin was increased to 80mg  at that time. Pt has not had follow up lipid panel since then.   Current HTN meds: carvedilol 9.375mg  BID, losartan 100mg  daily, spironolactone 25mg  daily BP goal: <130/80mmHg  Family History: Mother with MI and DM, brother with MI and DM.  Social History: Former smoker. Works at a [employer].  Diet: Soup for lunch - ramen noodles or spaghettios. Each serving of soup has 600-1000mg  of sodium in it. Snacks on crackers.  Exercise: Cardiac rehab, walks a lot with work.  Wt Readings from Last 3 Encounters:  12/14/15 152 lb 1.9 oz (69 kg)  02/28/15 152 lb 8 oz (69.2 kg)  04/23/14 139 lb 6.4 oz (63.2 kg)   BP Readings from Last 3 Encounters:  12/14/15 (!) 210/84  02/28/15 (!) 169/73  04/23/14 130/62   Pulse Readings from Last 3 Encounters:  12/14/15 77  02/28/15 65  04/23/14 69      Renal function: CrCl cannot be calculated (Patient's most recent lab result is older than the maximum 21 days allowed.).  Past Medical History:  Diagnosis Date  . Acute respiratory failure with hypoxia ([place])   . Acute systolic congestive heart failure ([place]) 02/11/2014  . COPD (chronic obstructive pulmonary disease) ([place]) 02/11/2014  . Essential hypertension   . Hypercholesteremia   . 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 Tobacco abuse     Current Outpatient Prescriptions on File Prior to Visit  Medication Sig Dispense Refill  . aspirin 81 MG EC tablet Take 1 tablet (81 mg total) by mouth daily.    [name] atorvastatin (LIPITOR) 80 MG tablet Take 1 tablet (80 mg total) by mouth daily. 90 tablet 3  . carvedilol (COREG) 6.25 MG tablet Take 1.5 tablets (9.375 mg total) by mouth 2 (two) times daily. 270 tablet 3  . gabapentin (NEURONTIN) 300 MG capsule Take 300 mg by mouth 3 (three) times daily.    [name] losartan (COZAAR) 100 MG tablet Take 1 tablet (100 mg total) by mouth daily. 90 tablet 3  .  spironolactone (ALDACTONE) 25 MG tablet Take 25 mg by mouth daily.     No current facility-administered medications on file prior to visit.     No Known Allergies   Assessment/Plan:  1. Hypertension - BP still extremely elevated at 208/82 today despite dose increase of losartan. Will increase carvedilol to 12.5mg  BID. Will also check BMET and if stable, will increase spironolactone dose. Pt eats high sodium soup every day - he will work to cut back on soup intake and will look for low sodium options. Will check labs in 1 week and f/u with BP check in 2 weeks.  2. Hyperlipidemia -  Most recent LDL was 96 above goal 70mg /dL given ASCVD. Pt's Lipitor was increased to 80mg  at that time but follow up lipids were not checked. Will check lipid panel today, pt has been fasting for the past 8 hours.   Fabian Coca E. Mercede Rollo, PharmD, CPP, [place] [id number], [place], [place] [address] Phone: [phone]; Fax: [phone] 12/27/2015 3:11 PM

## 2015-12-28 ENCOUNTER — Telehealth: Payer: Self-pay | Admitting: Pharmacist

## 2015-12-28 ENCOUNTER — Telehealth: Payer: Self-pay | Admitting: Cardiovascular Disease

## 2015-12-28 DIAGNOSIS — I10 Essential (primary) hypertension: Secondary | ICD-10-CM

## 2015-12-28 LAB — BASIC METABOLIC PANEL
BUN: 18 mg/dL (ref 7–25)
CO2: 23 mmol/L (ref 20–31)
Calcium: 9.5 mg/dL (ref 8.6–10.3)
Chloride: 98 mmol/L (ref 98–110)
Creat: 1.15 mg/dL (ref 0.70–1.25)
Glucose, Bld: 126 mg/dL — ABNORMAL HIGH (ref 65–99)
Potassium: 6.1 mmol/L — ABNORMAL HIGH (ref 3.5–5.3)
Sodium: 130 mmol/L — ABNORMAL LOW (ref 135–146)

## 2015-12-28 LAB — LIPID PANEL
Cholesterol: 176 mg/dL (ref <200)
HDL: 52 mg/dL (ref >40)
LDL Cholesterol: 75 mg/dL (ref <100)
Total CHOL/HDL Ratio: 3.4 Ratio (ref <5.0)
Triglycerides: 247 mg/dL — ABNORMAL HIGH (ref <150)
VLDL: 49 mg/dL — ABNORMAL HIGH (ref <30)

## 2015-12-28 MED ORDER — CHLORTHALIDONE 25 MG PO TABS: 25.0000 mg | ORAL_TABLET | Freq: Every day | ORAL | 11 refills | Status: DC

## 2015-12-28 NOTE — Telephone Encounter (Signed)
BMET checked at HTN visit yesterday after recent dose increase of losartan from 25mg  to 100mg  daily. K increased to 6.1 Discussed with Dr Johnsie Cancel, will plan to stop spironolactone and start chlorthalidone 25mg  daily with repeat BMET in 1 week. Pt will keep HTN appt in 2 weeks. Pt is aware of plan.

## 2015-12-28 NOTE — Telephone Encounter (Signed)
Duplicate, see below  Please call at lunch time, 11:50

## 2015-12-28 NOTE — Telephone Encounter (Signed)
Spoke with pt, see other telephone encounter from today for details.

## 2015-12-28 NOTE — Telephone Encounter (Signed)
New message ° °Pt is returning call  ° °Please call back °

## 2016-01-06 ENCOUNTER — Other Ambulatory Visit: Payer: BLUE CROSS/BLUE SHIELD | Admitting: *Deleted

## 2016-01-06 ENCOUNTER — Telehealth: Payer: Self-pay | Admitting: Pharmacist

## 2016-01-06 DIAGNOSIS — I10 Essential (primary) hypertension: Secondary | ICD-10-CM

## 2016-01-06 LAB — BASIC METABOLIC PANEL
BUN: 18 mg/dL (ref 7–25)
CO2: 23 mmol/L (ref 20–31)
Calcium: 8.6 mg/dL (ref 8.6–10.3)
Chloride: 92 mmol/L — ABNORMAL LOW (ref 98–110)
Creat: 1.07 mg/dL (ref 0.70–1.25)
Glucose, Bld: 110 mg/dL — ABNORMAL HIGH (ref 65–99)
Potassium: 3.8 mmol/L (ref 3.5–5.3)
Sodium: 126 mmol/L — ABNORMAL LOW (ref 135–146)

## 2016-01-06 MED ORDER — AMLODIPINE BESYLATE 5 MG PO TABS: 5.0000 mg | ORAL_TABLET | Freq: Every day | ORAL | 11 refills | Status: DC

## 2016-01-06 NOTE — Telephone Encounter (Signed)
Called pt regarding BMET results from today. K much improved since stopping spironolactone 1 week ago but now Na and Cl are low. Pt already eats a high sodium diet but had been working to cut back. Na decrease over the past week is likely due to addition of chlorthalidone and sodium restriction in diet. Will d/c chlorthalidone and start patient on amlodipine 5mg  daily. Will have pt continue with low sodium diet since his systolic BP was still over 200 at last visit. Pt is aware of results and will f/u in HTN clinic in 1 week as scheduled.

## 2016-01-10 ENCOUNTER — Ambulatory Visit (HOSPITAL_COMMUNITY)
Admission: RE | Admit: 2016-01-10 | Discharge: 2016-01-10 | Disposition: A | Discharge status: Home / Self Care | Payer: BLUE CROSS/BLUE SHIELD | Source: Ambulatory Visit | Attending: Cardiology | Admitting: Cardiology

## 2016-01-10 DIAGNOSIS — I2510 Atherosclerotic heart disease of native coronary artery without angina pectoris: Secondary | ICD-10-CM

## 2016-01-10 DIAGNOSIS — I10 Essential (primary) hypertension: Secondary | ICD-10-CM | POA: Diagnosis not present

## 2016-01-10 DIAGNOSIS — I701 Atherosclerosis of renal artery: Secondary | ICD-10-CM

## 2016-01-13 ENCOUNTER — Ambulatory Visit: Payer: BLUE CROSS/BLUE SHIELD

## 2016-02-23 NOTE — Progress Notes (Signed)
Patient ID: Caleb [name], male   DOB: 1950-09-14, 66 y.o.   MRN: [id number]   Patient is a 66 y.o. old male patient of [name]  post coronary artery bypass grafting 3 using bilateral internal mammary arteries on 02/17/2014 for severe three-vessel artery disease status post acute non-ST segment elevation myocardial infarction. The patient's postoperative recovery was uncomplicated and he was discharged from the hospital on the third postoperative day.He returns for routine follow-up today. [name] He continues to abstain from any tobacco smoking. He reports minimal residual soreness in his chest. He denies any symptoms of exertional shortness of breath or chest discomfort.    Due to see [name] as primary for colonoscopy and labs Works at [employer] from [name] and [name] make no mention of his preop echo showing EF 30-35%  No mention of life vest in hospital  MRI 05/21/15 reviewed EF recovered to 58% with only small inferior apical SE scar  BP been high seeing nurse for visits  Amlodipine started last visit with improvement   ROS: Denies fever, malais, weight loss, blurry vision, decreased visual acuity, cough, sputum, SOB, hemoptysis, pleuritic pain, palpitaitons, heartburn, abdominal pain, melena, lower extremity edema, claudication, or rash.  All other systems reviewed and negative  General: Affect appropriate Healthy:  appears stated age 66: normal Neck supple with no adenopathy JVP normal no bruits no thyromegaly Lungs clear with no wheezing and good diaphragmatic motion Heart:  S1/S2 no murmur, no rub, gallop or click Stenoromy well healed  PMI normal Abdomen: benighn, BS positve, no tenderness, no AAA ? Renal bruit  no bruit.  No HSM or HJR Distal pulses intact with no bruits No edema Neuro non-focal Skin warm and dry venectomy sights legs well healed  No muscular weakness   Current Outpatient Prescriptions  Medication Sig Dispense Refill  . amLODipine (NORVASC)  5 MG tablet Take 1 tablet (5 mg total) by mouth daily. 30 tablet 11  . aspirin 81 MG EC tablet Take 1 tablet (81 mg total) by mouth daily.    [name] atorvastatin (LIPITOR) 80 MG tablet Take 1 tablet (80 mg total) by mouth daily. 90 tablet 3  . carvedilol (COREG) 12.5 MG tablet Take 1 tablet (12.5 mg total) by mouth 2 (two) times daily. 60 tablet 11  . gabapentin (NEURONTIN) 300 MG capsule Take 300 mg by mouth 3 (three) times daily.    [name] losartan (COZAAR) 100 MG tablet Take 1 tablet (100 mg total) by mouth daily. 90 tablet 3   No current facility-administered medications for this visit.     Allergies  Patient has no known allergies.  Electrocardiogram:  03/11/14  SR IMI  Lateral T wave inversions  12/14/15 SR rate 74 LVH inferior lateral  T wave inversions   Assessment and Plan CAD:  Post CABG 02/17/14  Doing well sternum well healed continue asa and beta blocker DCM:  F/u MRI 05/21/14 with EF 58% small SE scar inferior apex great recovery post revascularization no need for AICD Chol :  On statin lipitor increased to 80 mg  Lab Results  Component Value Date   LDLCALC 75 12/27/2015   HTN:  Issues with K/Na on diuretics   Smoking:  Continues to abstain since surgery    F/U me  A year   Jenkins Rouge

## 2016-03-07 ENCOUNTER — Encounter: Payer: Self-pay | Admitting: Cardiovascular Disease

## 2016-03-07 ENCOUNTER — Encounter (INDEPENDENT_AMBULATORY_CARE_PROVIDER_SITE_OTHER): Payer: Self-pay

## 2016-03-07 ENCOUNTER — Ambulatory Visit (INDEPENDENT_AMBULATORY_CARE_PROVIDER_SITE_OTHER): Payer: BLUE CROSS/BLUE SHIELD | Admitting: Cardiovascular Disease

## 2016-03-07 VITALS — BP 140/60 | HR 70 | Ht 64.0 in | Wt 155.0 lb

## 2016-03-07 DIAGNOSIS — I2510 Atherosclerotic heart disease of native coronary artery without angina pectoris: Secondary | ICD-10-CM | POA: Diagnosis not present

## 2016-03-07 NOTE — Patient Instructions (Signed)

## 2016-11-08 ENCOUNTER — Other Ambulatory Visit: Payer: Self-pay | Admitting: *Deleted

## 2016-11-08 DIAGNOSIS — I701 Atherosclerosis of renal artery: Secondary | ICD-10-CM

## 2017-03-04 NOTE — Progress Notes (Signed)
Patient ID: Caleb [name], male   DOB: [birth year], 67 y.o.   MRN: [id number]   Patient is a 67 y.o. old male patient of [name]  post coronary artery bypass grafting 3 using bilateral internal mammary arteries on 02/17/2014 for severe three-vessel artery disease status post acute non-ST segment elevation myocardial infarction. The patient's postoperative recovery was uncomplicated and he was discharged from the hospital on the third postoperative day.He returns for routine follow-up today. Caleb [name] He continues to abstain from any tobacco smoking. He reports minimal residual soreness in his chest. He denies any symptoms of exertional shortness of breath or chest discomfort.    Due to see [name] as primary for colonoscopy and labs Works at [employer] from [name] and [name] make no mention of his preop echo showing EF 30-35%  No mention of life vest in hospital  MRI 05/21/15 reviewed EF recovered to 58% with only small inferior apical SE scar  BP been high seeing nurse for visits  Amlodipine started last visit still high Seems depressed ex wife died in 02-08-23. Stated smoking again   ROS: Denies fever, malais, weight loss, blurry vision, decreased visual acuity, cough, sputum, SOB, hemoptysis, pleuritic pain, palpitaitons, heartburn, abdominal pain, melena, lower extremity edema, claudication, or rash.  All other systems reviewed and negative  General: BP (!) 168/78   Pulse 83   Ht 5\' 5"  (1.651 m)   Wt 151 lb 8 oz (68.7 kg)   SpO2 98%   BMI 25.21 kg/m  Affect appropriate Healthy:  appears stated age 16: normal Neck supple with no adenopathy JVP normal right  bruits no thyromegaly Lungs clear with no wheezing and good diaphragmatic motion Heart:  S1/S2 no murmur, no rub, gallop or click PMI normal post sternotomy  Abdomen: benighn, BS positve, no tenderness, no AAA no bruit.  No HSM or HJR Distal pulses intact with no bruits No edema Neuro non-focal Skin warm and  dry No muscular weakness    Current Outpatient Medications  Medication Sig Dispense Refill  . amLODipine (NORVASC) 10 MG tablet Take 1 tablet (10 mg total) by mouth daily. 90 tablet 3  . aspirin 81 MG EC tablet Take 1 tablet (81 mg total) by mouth daily.    Caleb [name] atorvastatin (LIPITOR) 80 MG tablet Take 1 tablet (80 mg total) by mouth daily. 90 tablet 3  . carvedilol (COREG) 12.5 MG tablet Take 1 tablet (12.5 mg total) by mouth 2 (two) times daily. 60 tablet 11  . gabapentin (NEURONTIN) 300 MG capsule Take 300 mg by mouth 3 (three) times daily.    Caleb [name] losartan (COZAAR) 100 MG tablet Take 1 tablet (100 mg total) by mouth daily. 90 tablet 3   No current facility-administered medications for this visit.     Allergies  Patient has no known allergies.  Electrocardiogram:  03/11/17 SR rate 75 insignificant Q waves in 2,3,F  Assessment and Plan CAD:  Post CABG 02/17/14  No angina continue medical Rx beta blocker and ASA  DCM:  F/u MRI 05/21/14 with EF 58% small SE scar inferior apex F/U echo to reassess and make sure still normal  Chol :  On statin lipitor increased to 80 mg labs with primary  HTN:  Still elevated increase norvasc to 10 mg schedule written out for patient  Smoking:  Started back 8 months ago f/u CXR counseled on cessation for less than 10 minutes Right Bruit:  ASA consider f/u duplex next visit patient wants to  limit testing   Echo and CXR  F/U with me in a year   Jenkins Rouge

## 2017-03-11 ENCOUNTER — Ambulatory Visit
Admission: RE | Admit: 2017-03-11 | Discharge: 2017-03-11 | Disposition: A | Discharge status: Home / Self Care | Payer: BLUE CROSS/BLUE SHIELD | Source: Ambulatory Visit | Attending: Cardiovascular Disease | Admitting: Cardiovascular Disease

## 2017-03-11 ENCOUNTER — Encounter: Payer: Self-pay | Admitting: Cardiovascular Disease

## 2017-03-11 ENCOUNTER — Ambulatory Visit (INDEPENDENT_AMBULATORY_CARE_PROVIDER_SITE_OTHER): Payer: BLUE CROSS/BLUE SHIELD | Admitting: Cardiovascular Disease

## 2017-03-11 VITALS — BP 168/78 | HR 83 | Ht 65.0 in | Wt 151.5 lb

## 2017-03-11 DIAGNOSIS — I420 Dilated cardiomyopathy: Secondary | ICD-10-CM

## 2017-03-11 DIAGNOSIS — E785 Hyperlipidemia, unspecified: Secondary | ICD-10-CM | POA: Diagnosis not present

## 2017-03-11 DIAGNOSIS — J449 Chronic obstructive pulmonary disease, unspecified: Secondary | ICD-10-CM | POA: Diagnosis not present

## 2017-03-11 DIAGNOSIS — F17200 Nicotine dependence, unspecified, uncomplicated: Secondary | ICD-10-CM | POA: Diagnosis not present

## 2017-03-11 DIAGNOSIS — I25810 Atherosclerosis of coronary artery bypass graft(s) without angina pectoris: Secondary | ICD-10-CM | POA: Diagnosis not present

## 2017-03-11 DIAGNOSIS — I10 Essential (primary) hypertension: Secondary | ICD-10-CM | POA: Diagnosis not present

## 2017-03-11 MED ORDER — AMLODIPINE BESYLATE 10 MG PO TABS: 10.0000 mg | ORAL_TABLET | Freq: Every day | ORAL | 3 refills | Status: DC

## 2017-03-11 NOTE — Patient Instructions (Addendum)
Medication Instructions:  Your physician has recommended you make the following change in your medication:  1-Increase Amlodipine 10 mg by mouth daily.  Lab work: NONE  Testing/Procedures: Your physician has requested that you have an echocardiogram. Echocardiography is a painless test that uses sound waves to create images of your heart. It provides your doctor with information about the size and shape of your heart and how well your heart's chambers and valves are working. This procedure takes approximately one hour. There are no restrictions for this procedure.  A chest x-ray takes a picture of the organs and structures inside the chest, including the heart, lungs, and blood vessels. This test can show several things, including, whether the heart is enlarges; whether fluid is building up in the lungs; and whether pacemaker / defibrillator leads are still in place. At [place]: Your physician wants you to follow-up in: 12 months with Dr. Johnsie Cancel. You will receive a reminder letter in the mail two months in advance. If you don't receive a letter, please call our office to schedule the follow-up appointment.   If you need a refill on your cardiac medications before your next appointment, please call your pharmacy.

## 2017-03-22 ENCOUNTER — Other Ambulatory Visit: Payer: Self-pay

## 2017-03-22 ENCOUNTER — Ambulatory Visit (HOSPITAL_COMMUNITY): Payer: BLUE CROSS/BLUE SHIELD | Attending: Cardiology

## 2017-03-22 DIAGNOSIS — I10 Essential (primary) hypertension: Secondary | ICD-10-CM | POA: Diagnosis not present

## 2017-03-22 DIAGNOSIS — E7800 Pure hypercholesterolemia, unspecified: Secondary | ICD-10-CM | POA: Diagnosis not present

## 2017-03-22 DIAGNOSIS — I420 Dilated cardiomyopathy: Secondary | ICD-10-CM

## 2017-03-22 DIAGNOSIS — E785 Hyperlipidemia, unspecified: Secondary | ICD-10-CM | POA: Diagnosis not present

## 2017-03-22 DIAGNOSIS — F17200 Nicotine dependence, unspecified, uncomplicated: Secondary | ICD-10-CM | POA: Diagnosis not present

## 2017-03-22 DIAGNOSIS — J449 Chronic obstructive pulmonary disease, unspecified: Secondary | ICD-10-CM | POA: Diagnosis not present

## 2017-03-22 DIAGNOSIS — I25810 Atherosclerosis of coronary artery bypass graft(s) without angina pectoris: Secondary | ICD-10-CM | POA: Insufficient documentation

## 2017-08-29 ENCOUNTER — Encounter (HOSPITAL_COMMUNITY): Payer: BLUE CROSS/BLUE SHIELD

## 2017-09-06 ENCOUNTER — Ambulatory Visit (HOSPITAL_COMMUNITY)
Admission: RE | Admit: 2017-09-06 | Discharge: 2017-09-06 | Disposition: A | Discharge status: Home / Self Care | Payer: Medicare HMO | Source: Ambulatory Visit | Attending: Internal Medicine | Admitting: Internal Medicine

## 2017-09-06 DIAGNOSIS — I701 Atherosclerosis of renal artery: Secondary | ICD-10-CM | POA: Diagnosis not present

## 2017-09-20 ENCOUNTER — Telehealth: Payer: Self-pay | Admitting: Cardiovascular Disease

## 2017-09-20 DIAGNOSIS — I701 Atherosclerosis of renal artery: Secondary | ICD-10-CM

## 2017-09-20 NOTE — Telephone Encounter (Signed)
Patient aware of renal results. Per Dr. 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 Will send message to scheduling to call patient with an appointment. Patient verbalized understanding.

## 2017-09-20 NOTE — Telephone Encounter (Signed)
° ° °  Please call with renal US results

## 2017-10-22 ENCOUNTER — Encounter: Payer: Self-pay | Admitting: Cardiovascular Disease

## 2017-10-22 ENCOUNTER — Ambulatory Visit (INDEPENDENT_AMBULATORY_CARE_PROVIDER_SITE_OTHER): Payer: Medicare HMO | Admitting: Cardiovascular Disease

## 2017-10-22 VITALS — BP 162/64 | HR 63 | Ht 65.0 in | Wt 150.4 lb

## 2017-10-22 DIAGNOSIS — I25118 Atherosclerotic heart disease of native coronary artery with other forms of angina pectoris: Secondary | ICD-10-CM | POA: Diagnosis not present

## 2017-10-22 DIAGNOSIS — I701 Atherosclerosis of renal artery: Secondary | ICD-10-CM | POA: Diagnosis not present

## 2017-10-22 NOTE — Patient Instructions (Signed)
Medication Instructions:  Your physician recommends that you continue on your current medications as directed. Please refer to the Current Medication list given to you today.  If you need a refill on your cardiac medications before your next appointment, please call your pharmacy.   Lab work: none If you have labs (blood work) drawn today and your tests are completely normal, you will receive your results only by: [name] MyChart Message (if you have MyChart) OR . A paper copy in the mail If you have any lab test that is abnormal or we need to change your treatment, we will call you to review the results.  Testing/Procedures: Your physician has requested that you have a renal artery duplex. During this test, an ultrasound is used to evaluate blood flow to the kidneys. Allow one hour for this exam. Do not eat after midnight the day before and avoid carbonated beverages. Take your medications as you usually do.    Follow-Up: At [facility], you and your health needs are our priority.  As part of our continuing mission to provide you with exceptional heart care, we have created designated Provider Care Teams.  These Care Teams include your primary Cardiologist (physician) and Advanced Practice Providers (APPs -  Physician Assistants and Nurse Practitioners) who all work together to provide you with the care you need, when you need it. You will need a follow up appointment in 12 months.  Please call our office 2 months in advance to schedule this appointment.  You may see  Dr. Gwenlyn Found - [facility]  or one of the following Advanced Practice Providers on your designated Care Team:   Kerin Ransom, PA-C Roby Lofts, [place] . Sande Rives, PA-C  Any Other Special Instructions Will Be Listed Below (If Applicable).

## 2017-10-22 NOTE — Assessment & Plan Note (Signed)
Caleb [name] was referred to me by Dr. Johnsie Cancel for evaluation of duplex evidence of moderate left renal artery stenosis.  He had renal Dopplers performed 01/10/2016 that revealed a left RAR 3.03 the peak velocity of 321 cm/s.  His left renal dimension was 11 cm pole-to-pole.  His most recent renal Dopplers performed 09/06/2017 revealed a left RAR of 4 similar multiple dimensions.  He is on amlodipine and carvedilol if blood pressures to run in the 140 range at home.  At this point, I do not see an indication to perform intervention but we will continue to monitor him noninvasively on an annual basis.

## 2017-10-22 NOTE — Progress Notes (Signed)
Lorretta Harp MD FACP,FACC,FAHA, FSCAI 10/22/2017 10:26 AM    10/22/2017 [name]   1950/04/30  [id number]  Primary Physician Christain Sacramento, MD Primary Cardiologist: Lorretta Harp MD Garret Reddish, [place], [place]  HPI:  Caleb [name] is a 67 y.o. recently widowed (second wife died 28-Jan-2023) father of 2, grandfather one grandchild who is accompanied by his daughter Levada Dy today.  Referred by Dr. Johnsie Cancel for peripheral vascular evaluation because of duplex evidence of moderate left renal artery stenosis.  He continues to work doing Actuary.  His risk factors include ongoing tobacco abuse of 1/2 pack/day treated hypertension hyperlipidemia.  He did have coronary artery bypass grafting 02/17/2014 with bilateral IMA's with recent echo that showed normal LV function.  He had a right femoropopliteal bypass graft at [facility] 3/19 because of critical limb ischemia.  He currently denies chest pain, shortness of breath does have mild claudication.  Renal Dopplers performed 01/10/2016 revealed a left RAR 3.03 with a pole-to-pole dimension of 11 cm.  His most recent renal Doppler study performed 09/06/2017 revealed a left R AR of 4 with unchanged renal dimensions.  Blood pressures under fairly good control at home on 2 medications.   Current Meds  Medication Sig  . amLODipine (NORVASC) 10 MG tablet Take 1 tablet (10 mg total) by mouth daily.  [name] atorvastatin (LIPITOR) 80 MG tablet Take 1 tablet (80 mg total) by mouth daily.  . carvedilol (COREG) 12.5 MG tablet Take 1 tablet (12.5 mg total) by mouth 2 (two) times daily.  [name] gabapentin (NEURONTIN) 300 MG capsule Take 300 mg by mouth 3 (three) times daily.  . rivaroxaban (XARELTO) 10 MG TABS tablet Take 10 mg by mouth daily.     Allergies  Allergen Reactions  . Sulfamethoxazole-Trimethoprim Other (See Comments)  . Cilostazol Swelling  . Lisinopril Other (See Comments)    Unknown    Social History   Socioeconomic  History  . Marital status: Single    Spouse name: Not on file  . Number of children: Not on file  . Years of education: Not on file  . Highest education level: Not on file  Occupational History  . Not on file  Social Needs  . Financial resource strain: Not on file  . Food insecurity:    Worry: Not on file    Inability: Not on file  . Transportation needs:    Medical: Not on file    Non-medical: Not on file  Tobacco Use  . Smoking status: Former Smoker    Types: Cigarettes    Last attempt to quit: 02/11/2014    Years since quitting: 3.6  . Smokeless tobacco: Never Used  Substance and Sexual Activity  . Alcohol use: Yes    Alcohol/week: 0.0 standard drinks    Comment: Six pack a day of beer  . Drug use: Not on file  . Sexual activity: Not on file  Lifestyle  . Physical activity:    Days per week: Not on file    Minutes per session: Not on file  . Stress: Not on file  Relationships  . Social connections:    Talks on phone: Not on file    Gets together: Not on file    Attends religious service: Not on file    Active member of club or organization: Not on file    Attends meetings of clubs or organizations: Not on file    Relationship status: Not on  file  . Intimate partner violence:    Fear of current or ex partner: Not on file    Emotionally abused: Not on file    Physically abused: Not on file    Forced sexual activity: Not on file  Other Topics Concern  . Not on file  Social History Narrative  . Not on file     Review of Systems: General: negative for chills, fever, night sweats or weight changes.  Cardiovascular: negative for chest pain, dyspnea on exertion, edema, orthopnea, palpitations, paroxysmal nocturnal dyspnea or shortness of breath Dermatological: negative for rash Respiratory: negative for cough or wheezing Urologic: negative for hematuria Abdominal: negative for nausea, vomiting, diarrhea, bright red blood per rectum, melena, or  hematemesis Neurologic: negative for visual changes, syncope, or dizziness All other systems reviewed and are otherwise negative except as noted above.    Blood pressure (!) 162/64, pulse 63, height 5\' 5"  (1.651 m), weight 150 lb 6.4 oz (68.2 kg).  General appearance: alert and no distress Neck: no adenopathy, no carotid bruit, no JVD, supple, symmetrical, trachea midline and thyroid not enlarged, symmetric, no tenderness/mass/nodules Lungs: clear to auscultation bilaterally Heart: regular rate and rhythm, S1, S2 normal, no murmur, click, rub or gallop Extremities: extremities normal, atraumatic, no cyanosis or edema Pulses: 2+ and symmetric Skin: Skin color, texture, turgor normal. No rashes or lesions Neurologic: Alert and oriented X 3, normal strength and tone. Normal symmetric reflexes. Normal coordination and gait  EKG sinus rhythm at 63 without ST or T wave changes.  I personally reviewed this EKG.  ASSESSMENT AND PLAN:   Renal artery stenosis [facility]) Mr. Sollenberger was referred to me by Dr. Johnsie Cancel for evaluation of duplex evidence of moderate left renal artery stenosis.  He had renal Dopplers performed 01/10/2016 that revealed a left RAR 3.03 the peak velocity of 321 cm/s.  His left renal dimension was 11 cm pole-to-pole.  His most recent renal Dopplers performed 09/06/2017 revealed a left RAR of 4 similar multiple dimensions.  He is on amlodipine and carvedilol if blood pressures to run in the 140 range at home.  At this point, I do not see an indication to perform intervention but we will continue to monitor him noninvasively on an annual basis.      Lorretta Harp MD FACP,FACC,FAHA, [facility] 10/22/2017 10:26 AM

## 2018-01-30 ENCOUNTER — Telehealth: Payer: Self-pay | Admitting: *Deleted

## 2018-01-30 NOTE — Telephone Encounter (Signed)
   [facility] Pre-operative Risk Assessment    Request for surgical clearance:  1. What type of surgery is being performed? 02/10/18 CATARACT EXTRACTION W/INTRAOCULAR LENS IMPLANTATION OF THE RIGHT EYE. 03/03/18 LEFT EYE CATARACT EXTRACTION W/INTRAOCULAR LENS IMPLANTATIONS  2. When is this surgery scheduled? 02/10/18 AND 03/03/18   3. What type of clearance is required (medical clearance vs. Pharmacy clearance to hold med vs. Both)? BOTH  4. Are there any medications that need to be held prior to surgery and how long?NONE TO BE HELD PER DR. BEVIS  5. Practice name and name of physician performing surgery? [facility] ; DR.TIMOTHY BEVIS  6. What is your office phone number [phone]    7.   What is your office fax number [phone]  8.   Anesthesia type (None, local, MAC, general) ? TOPICAL W/IV MEDICATION   Julaine Hua 01/30/2018, 4:25 PM  _________________________________________________________________   (provider comments below)

## 2018-01-31 NOTE — Telephone Encounter (Signed)
Agree with Dr. Talbert Forest. No need to hold xarelto for cataract surgery

## 2018-01-31 NOTE — Telephone Encounter (Signed)
   Primary Cardiologist: Jenkins Rouge, MD  Chart reviewed as part of pre-operative protocol coverage. Cataract extractions are recognized in guidelines as low risk surgeries that do not typically require specific preoperative testing or holding of blood thinner therapy. Therefore, given past medical history and time since last visit, based on ACC/AHA guidelines, Caleb [name] would be at acceptable risk for the planned procedure without further cardiovascular testing.   I will route this recommendation to the requesting party via Epic fax function and remove from pre-op pool.  Please call with questions.  Abigail Butts, PA-C 01/31/2018, 9:25 AM

## 2018-02-03 ENCOUNTER — Telehealth: Payer: Self-pay | Admitting: Cardiovascular Disease

## 2018-02-03 NOTE — Telephone Encounter (Signed)
Follow Up:   Clarene Critchley from Dr Talbert Forest office, checking on the status of pt's clearance. Please fax to [phone] Att::Theresa

## 2018-02-05 NOTE — Telephone Encounter (Signed)
Faxed clearance via faxed machine, call office to see if they have received it, she stated she will call back if she has not received it.

## 2018-03-11 NOTE — Progress Notes (Signed)
Patient ID: Caleb [name], male   DOB: 1950-12-26, 68 y.o.   MRN: [id number]     Patient is a 68 y.o. old male post coronary artery bypass grafting 3 using bilateral internal mammary arteries on 02/17/2014 for severe three-vessel artery disease status post acute non-ST segment elevation myocardial infarction.      MRI 05/21/15 reviewed EF recovered to 58% with only small inferior apical SE scar  Ex wife died in 01-23-2017 . Stated smoking again   Has moderate left renal artery stenosis seen by J[facility] 10/22/17 no intervention needed [facility] as well with previous right fem pop bypass and chronic LE edema   BP is well controlled Cr is normal No claudication but RLE edema   ROS: Denies fever, malais, weight loss, blurry vision, decreased visual acuity, cough, sputum, SOB, hemoptysis, pleuritic pain, palpitaitons, heartburn, abdominal pain, melena, lower extremity edema, claudication, or rash.  All other systems reviewed and negative  General: BP 124/66   Pulse 66   Ht 5\' 4"  (1.626 m)   Wt 68.4 kg   SpO2 99%   BMI 25.88 kg/m  Affect appropriate Healthy:  appears stated age 68: normal Neck supple with no adenopathy JVP normal right  bruits no thyromegaly Lungs clear with no wheezing and good diaphragmatic motion Heart:  S1/S2 no murmur, no rub, gallop or click PMI normal post sternotomy  Abdomen: benighn, BS positve, no tenderness, no AAA no bruit.  No HSM or HJR Post right fempop bypass Plus 2 right LE  edema Neuro non-focal Skin warm and dry No muscular weakness    Current Outpatient Medications  Medication Sig Dispense Refill  . acetaminophen (TYLENOL) 500 MG tablet Take 1 tablet by mouth daily.    [name] amLODipine (NORVASC) 10 MG tablet Take 1 tablet (10 mg total) by mouth daily. 90 tablet 3  . atorvastatin (LIPITOR) 80 MG tablet Take 1 tablet (80 mg total) by mouth daily. 90 tablet 3  . carvedilol (COREG) 12.5 MG tablet Take 1 tablet (12.5 mg total) by mouth 2 (two)  times daily. 60 tablet 11  . gabapentin (NEURONTIN) 300 MG capsule Take 300 mg by mouth 3 (three) times daily.    [name] HYDROcodone-acetaminophen (NORCO/VICODIN) 5-325 MG tablet Take 1 tablet by mouth as needed.    . rivaroxaban (XARELTO) 10 MG TABS tablet Take 10 mg by mouth daily.    [name] losartan-hydrochlorothiazide (HYZAAR) 50-12.5 MG tablet Take 1 tablet by mouth daily. 90 tablet 3   No current facility-administered medications for this visit.     Allergies  Sulfamethoxazole-trimethoprim; Cilostazol; and Lisinopril  Electrocardiogram:  03/11/17 SR rate 75 insignificant Q waves in 2,3,F  Assessment and Plan CAD:  Post CABG 02/17/14  No angina continue medical Rx beta blocker and ASA  DCM:  EF 60-65% TTE 03/22/17 continue medical Rx good improvement post CABG  Chol :  On statin lipitor increased to 80 mg labs with primary  HTN:  Well controlled.  Continue current medications and low sodium Dash type diet.   Smoking:  Started back CXR 03/11/17 NAD lung cancer screening CT ordered  Right Bruit:  ASA consider f/u duplex next visit patient wants to limit testing  PVD:  Follows at [facility] for right fempop denies claudication Seen by Dr Gwenlyn Found for left RAS observation warranted  Edema: chronic RLE Has compression stocking on add 12.5 mg HCTZ to cozaar f/u BMET in 4 weeks    F/U with me in a year   [employer]

## 2018-03-14 ENCOUNTER — Encounter: Payer: Self-pay | Admitting: Cardiovascular Disease

## 2018-03-14 ENCOUNTER — Ambulatory Visit: Payer: Medicare HMO | Admitting: Cardiovascular Disease

## 2018-03-14 VITALS — BP 124/66 | HR 66 | Ht 64.0 in | Wt 150.8 lb

## 2018-03-14 DIAGNOSIS — I25810 Atherosclerosis of coronary artery bypass graft(s) without angina pectoris: Secondary | ICD-10-CM

## 2018-03-14 DIAGNOSIS — E785 Hyperlipidemia, unspecified: Secondary | ICD-10-CM | POA: Diagnosis not present

## 2018-03-14 DIAGNOSIS — Z87891 Personal history of nicotine dependence: Secondary | ICD-10-CM

## 2018-03-14 DIAGNOSIS — I420 Dilated cardiomyopathy: Secondary | ICD-10-CM

## 2018-03-14 DIAGNOSIS — I10 Essential (primary) hypertension: Secondary | ICD-10-CM | POA: Diagnosis not present

## 2018-03-14 DIAGNOSIS — Z720 Tobacco use: Secondary | ICD-10-CM

## 2018-03-14 DIAGNOSIS — R0989 Other specified symptoms and signs involving the circulatory and respiratory systems: Secondary | ICD-10-CM

## 2018-03-14 MED ORDER — LOSARTAN POTASSIUM-HCTZ 50-12.5 MG PO TABS: 1.0000 | ORAL_TABLET | Freq: Every day | ORAL | 3 refills | Status: DC

## 2018-03-14 NOTE — Patient Instructions (Addendum)
Medication Instructions:  Your physician has recommended you make the following change in your medication:  1-STOP Losartan  2-START Losartan/HCTZ 50/12.5 mg by mouth daily.  If you need a refill on your cardiac medications before your next appointment, please call your pharmacy.   Lab work: Your physician recommends that you return for lab work in: 4 weeks for BMET  If you have labs (blood work) drawn today and your tests are completely normal, you will receive your results only by: [name] MyChart Message (if you have MyChart) OR . A paper copy in the mail If you have any lab test that is abnormal or we need to change your treatment, we will call you to review the results.  Testing/Procedures: Non-Cardiac CT scanning for lung cancer, (CAT scanning), is a noninvasive, special x-ray that produces cross-sectional images of the body using x-rays and a computer. CT scans help physicians diagnose and treat medical conditions. For some CT exams, a contrast material is used to enhance visibility in the area of the body being studied. CT scans provide greater clarity and reveal more details than regular x-ray exams.  Follow-Up: At [facility], you and your health needs are our priority.  As part of our continuing mission to provide you with exceptional heart care, we have created designated Provider Care Teams.  These Care Teams include your primary Cardiologist (physician) and Advanced Practice Providers (APPs -  Physician Assistants and Nurse Practitioners) who all work together to provide you with the care you need, when you need it. You will need a follow up appointment in 12  months.  Please call our office 2 months in advance to schedule this appointment.  You may see Jenkins Rouge, MD or one of the following Advanced Practice Providers on your designated Care Team:   Truitt Merle, NP Cecilie Kicks, NP . Kathyrn Drown, NP

## 2018-04-09 ENCOUNTER — Other Ambulatory Visit: Payer: Medicare HMO

## 2018-04-09 ENCOUNTER — Inpatient Hospital Stay: Admission: RE | Admit: 2018-04-09 | Payer: Medicare HMO | Source: Ambulatory Visit

## 2018-04-11 ENCOUNTER — Other Ambulatory Visit: Payer: Medicare HMO

## 2018-05-30 ENCOUNTER — Telehealth: Payer: Self-pay | Admitting: Cardiovascular Disease

## 2018-05-30 NOTE — Telephone Encounter (Signed)
New Message    Pt is calling to schedule pts CT   Please call

## 2018-06-06 ENCOUNTER — Telehealth: Payer: Self-pay | Admitting: *Deleted

## 2018-06-06 NOTE — Telephone Encounter (Signed)
Called pt Caleb [name] to schedule CT gave direct number [phone] to call back.

## 2018-06-11 ENCOUNTER — Other Ambulatory Visit: Payer: Medicare HMO

## 2018-06-12 NOTE — Telephone Encounter (Signed)
CT scheduled 07/04/18.

## 2018-07-04 ENCOUNTER — Other Ambulatory Visit: Payer: Self-pay

## 2018-07-04 ENCOUNTER — Telehealth: Payer: Self-pay | Admitting: *Deleted

## 2018-07-04 ENCOUNTER — Ambulatory Visit (INDEPENDENT_AMBULATORY_CARE_PROVIDER_SITE_OTHER)
Admission: RE | Admit: 2018-07-04 | Discharge: 2018-07-04 | Disposition: A | Discharge status: Home / Self Care | Payer: Medicare HMO | Source: Ambulatory Visit | Attending: Cardiovascular Disease | Admitting: Cardiovascular Disease

## 2018-07-04 ENCOUNTER — Other Ambulatory Visit: Payer: Medicare HMO | Admitting: *Deleted

## 2018-07-04 ENCOUNTER — Other Ambulatory Visit: Payer: Medicare HMO

## 2018-07-04 DIAGNOSIS — R0989 Other specified symptoms and signs involving the circulatory and respiratory systems: Secondary | ICD-10-CM

## 2018-07-04 DIAGNOSIS — Z87891 Personal history of nicotine dependence: Secondary | ICD-10-CM

## 2018-07-04 DIAGNOSIS — I420 Dilated cardiomyopathy: Secondary | ICD-10-CM

## 2018-07-04 DIAGNOSIS — I10 Essential (primary) hypertension: Secondary | ICD-10-CM

## 2018-07-04 DIAGNOSIS — I25810 Atherosclerosis of coronary artery bypass graft(s) without angina pectoris: Secondary | ICD-10-CM

## 2018-07-04 DIAGNOSIS — Z720 Tobacco use: Secondary | ICD-10-CM

## 2018-07-04 DIAGNOSIS — E785 Hyperlipidemia, unspecified: Secondary | ICD-10-CM

## 2018-07-04 NOTE — Telephone Encounter (Signed)

## 2018-07-05 LAB — BASIC METABOLIC PANEL
BUN/Creatinine Ratio: 9 — ABNORMAL LOW (ref 10–24)
BUN: 8 mg/dL (ref 8–27)
CO2: 23 mmol/L (ref 20–29)
Calcium: 9.5 mg/dL (ref 8.6–10.2)
Chloride: 92 mmol/L — ABNORMAL LOW (ref 96–106)
Creatinine, Ser: 0.93 mg/dL (ref 0.76–1.27)
GFR calc Af Amer: 96 mL/min/{1.73_m2} (ref >59)
GFR calc non Af Amer: 83 mL/min/{1.73_m2} (ref >59)
Glucose: 103 mg/dL — ABNORMAL HIGH (ref 65–99)
Potassium: 4 mmol/L (ref 3.5–5.2)
Sodium: 129 mmol/L — ABNORMAL LOW (ref 134–144)

## 2018-07-10 ENCOUNTER — Telehealth: Payer: Self-pay | Admitting: Cardiovascular Disease

## 2018-07-10 NOTE — Telephone Encounter (Signed)
New Message     Pt c/o Shortness Of Breath: STAT if SOB developed within the last 24 hours or pt is noticeably SOB on the phone  1. Are you currently SOB (can you hear that pt is SOB on the phone)? 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 How long have you been experiencing SOB? A couple weeks  3. Are you SOB when sitting or when up moving around? Moving around   4. Are you currently experiencing any other symptoms? No

## 2018-07-10 NOTE — Telephone Encounter (Signed)
Left VM for pt to call back. Spoke with his daughter Levada Dy ([place]). She was reviewing his lung screening CT w him and when she talked about emphysema he said "maybe that is why I get tired a lot" - she states that he means when he goes to the store and walks around he gets sob and has to stop and rest during the activity. He has a cough and is a smoker who "is never going to quit" per daughter. She wants to know who should he see in regard to breathing.  I rec pt call back to discuss if any chest pressure or pain with the SOB/fatigue. Stopping to rest/sob is not new for him.  It has worsened gradually.  He hasn't said if he has any associated symptoms.  She will speak with him further.  If not, they will contact PCP to be seen to discuss further.

## 2018-07-14 NOTE — Telephone Encounter (Signed)
His primary can follow or refer to pulmonary

## 2018-07-24 ENCOUNTER — Other Ambulatory Visit: Payer: Self-pay

## 2018-07-24 ENCOUNTER — Encounter: Payer: Self-pay | Admitting: Pulmonary Disease

## 2018-07-24 ENCOUNTER — Ambulatory Visit: Payer: Medicare HMO | Admitting: Pulmonary Disease

## 2018-07-24 VITALS — BP 118/58 | HR 60 | Temp 98.1°F | Ht 64.0 in | Wt 156.0 lb

## 2018-07-24 DIAGNOSIS — R0600 Dyspnea, unspecified: Secondary | ICD-10-CM | POA: Diagnosis not present

## 2018-07-24 MED ORDER — ALBUTEROL SULFATE HFA 108 (90 BASE) MCG/ACT IN AERS: 2.0000 | INHALATION_SPRAY | Freq: Four times a day (QID) | RESPIRATORY_TRACT | 3 refills | Status: AC | PRN

## 2018-07-24 NOTE — Progress Notes (Signed)
Subjective:     Patient ID: Caleb [name], male   DOB: 09-28-1950, 68 y.o.   MRN: [id number]  Patient being evaluated for shortness of breath  He is short of breath with moderate exertion Has a lot of pain and discomfort in his lower extremity This is related to having heart surgery done in the past on the vein removed Has recently had to have surgery to have a vein repair  Shortness of breath with moderate exertion Denies a productive cough, does have an occasional cough Denies any chest pains or chest discomfort  He is an active smoker Was able to quit for about a year in the past  Denies any fevers or chills  He works as a Furniture conservator/restorer and is exposed to a lot of occupational triggers   Past Medical History:  Diagnosis Date  . Acute respiratory failure with hypoxia ([place])   . Acute systolic congestive heart failure ([place]) 02/11/2014  . COPD (chronic obstructive pulmonary disease) ([place]) 02/11/2014  . Essential hypertension   . Hypercholesteremia   . 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 Tobacco abuse    Social History   Socioeconomic History  . Marital status: Single    Spouse name: Not on file  . Number of children: Not on file  . Years of education: Not on file  . Highest education level: Not on file  Occupational History  . Not on file  Social Needs  . Financial resource strain: Not on file  . Food insecurity    Worry: Not on file    Inability: Not on file  . Transportation needs    Medical: Not on file    Non-medical: Not on file  Tobacco Use  . Smoking status: Current Every Day Smoker    Packs/day: 0.50    Years: 35.00    Pack years: 17.50    Types: Cigarettes    Last attempt to quit: 02/11/2014    Years since quitting: 4.4  . Smokeless tobacco: Never Used  . Tobacco comment: has quit twice before   Substance and Sexual Activity  . Alcohol use: Yes    Alcohol/week: 0.0 standard drinks    Comment: Six pack a  day of beer  . Drug use: Not on file  . Sexual activity: Not on file  Lifestyle  . Physical activity    Days per week: Not on file    Minutes per session: Not on file  . Stress: Not on file  Relationships  . Social Herbalist on phone: Not on file    Gets together: Not on file    Attends religious service: Not on file    Active member of club or organization: Not on file    Attends meetings of clubs or organizations: Not on file    Relationship status: Not on file  . Intimate partner violence    Fear of current or ex partner: Not on file    Emotionally abused: Not on file    Physically abused: Not on file    Forced sexual activity: Not on file  Other Topics Concern  . Not on file  Social History Narrative  . Not on file   Family History  Problem Relation Age of Onset  . Heart attack Mother   . Diabetes Mother   . Heart attack Brother   . Diabetes Brother   . Cancer  Sister   . Stroke Neg Hx      Review of Systems  Constitutional: Positive for fatigue.  HENT: Positive for rhinorrhea.   Eyes: Negative.   Respiratory: Positive for cough, shortness of breath and wheezing.   Cardiovascular: Positive for leg swelling.  Gastrointestinal: Negative.   Endocrine: Negative.   Genitourinary: Negative.   Musculoskeletal: Negative.   Skin: Negative.   Allergic/Immunologic: Positive for environmental allergies.  Neurological: Negative.   Hematological: Bruises/bleeds easily.  Psychiatric/Behavioral: Negative.        Objective:   Physical Exam Constitutional:      Appearance: Normal appearance.  HENT:     Head: Normocephalic and atraumatic.  Eyes:     General:        Right eye: No discharge.        Left eye: No discharge.     Pupils: Pupils are equal, round, and reactive to light.  Neck:     Musculoskeletal: Normal range of motion and neck supple. No neck rigidity or muscular tenderness.  Cardiovascular:     Rate and Rhythm: Normal rate and regular  rhythm.     Heart sounds: No murmur.  Pulmonary:     Effort: Pulmonary effort is normal.     Breath sounds: Normal breath sounds. Stridor present.  Abdominal:     General: There is no distension.     Palpations: There is no mass.     Tenderness: There is no abdominal tenderness.  Musculoskeletal: Normal range of motion.        General: No swelling or tenderness.  Skin:    General: Skin is warm and dry.     Coloration: Skin is not jaundiced or pale.  Neurological:     General: No focal deficit present.     Mental Status: He is alert.     Cranial Nerves: No cranial nerve deficit.     Sensory: No sensory deficit.  Psychiatric:        Mood and Affect: Mood normal.    Vitals:   07/24/18 1407  BP: (!) 118/58  Pulse: 60  Temp: 98.1 F (36.7 C)  SpO2: 98%       Assessment:     Dyspnea on exertion -Dyspnea on moderate exertion  Emphysema on recent CT scan -Has not been labeled with COPD -No previous evaluation  History of congestive heart failure -On medications  History of coronary artery disease  Tobacco abuse    Plan:     Obtain a pulmonary function study  Obtain an echocardiogram  Counseled about the need to quit smoking  Albuterol to be used as needed  We will escalate treatment depending on PFT findings  Importance of regular exercises discussed with the patient  The risks with continuing to smoke was discussed

## 2018-07-24 NOTE — Patient Instructions (Signed)
Shortness of breath Recent CT showing evidence of emphysema  Obtain breathing study Obtain echocardiogram  Prescription for albuterol to be used up to 4 times a day as needed  I will see you back in the office in 3 months  I encourage you to strongly work on quitting smoking

## 2018-07-31 ENCOUNTER — Ambulatory Visit (HOSPITAL_COMMUNITY): Payer: Medicare HMO | Attending: Internal Medicine

## 2018-07-31 ENCOUNTER — Other Ambulatory Visit: Payer: Self-pay

## 2018-07-31 DIAGNOSIS — R0600 Dyspnea, unspecified: Secondary | ICD-10-CM | POA: Diagnosis present

## 2018-08-13 ENCOUNTER — Encounter (HOSPITAL_COMMUNITY): Payer: Self-pay | Admitting: *Deleted

## 2018-08-13 ENCOUNTER — Inpatient Hospital Stay (HOSPITAL_COMMUNITY)
Admission: EM | Admit: 2018-08-13 | Discharge: 2018-08-16 | DRG: 641 | Disposition: A | Discharge status: Home / Self Care | Payer: Medicare HMO | Attending: Internal Medicine | Admitting: Internal Medicine

## 2018-08-13 ENCOUNTER — Other Ambulatory Visit: Payer: Self-pay

## 2018-08-13 DIAGNOSIS — Z7901 Long term (current) use of anticoagulants: Secondary | ICD-10-CM

## 2018-08-13 DIAGNOSIS — I701 Atherosclerosis of renal artery: Secondary | ICD-10-CM | POA: Diagnosis present

## 2018-08-13 DIAGNOSIS — Z951 Presence of aortocoronary bypass graft: Secondary | ICD-10-CM | POA: Diagnosis not present

## 2018-08-13 DIAGNOSIS — J449 Chronic obstructive pulmonary disease, unspecified: Secondary | ICD-10-CM | POA: Diagnosis present

## 2018-08-13 DIAGNOSIS — N179 Acute kidney failure, unspecified: Secondary | ICD-10-CM

## 2018-08-13 DIAGNOSIS — Z79899 Other long term (current) drug therapy: Secondary | ICD-10-CM | POA: Diagnosis not present

## 2018-08-13 DIAGNOSIS — E860 Dehydration: Secondary | ICD-10-CM | POA: Diagnosis present

## 2018-08-13 DIAGNOSIS — G629 Polyneuropathy, unspecified: Secondary | ICD-10-CM | POA: Diagnosis present

## 2018-08-13 DIAGNOSIS — E222 Syndrome of inappropriate secretion of antidiuretic hormone: Secondary | ICD-10-CM | POA: Diagnosis present

## 2018-08-13 DIAGNOSIS — I110 Hypertensive heart disease with heart failure: Secondary | ICD-10-CM | POA: Diagnosis present

## 2018-08-13 DIAGNOSIS — Z881 Allergy status to other antibiotic agents status: Secondary | ICD-10-CM | POA: Diagnosis not present

## 2018-08-13 DIAGNOSIS — D509 Iron deficiency anemia, unspecified: Secondary | ICD-10-CM | POA: Diagnosis present

## 2018-08-13 DIAGNOSIS — Z888 Allergy status to other drugs, medicaments and biological substances status: Secondary | ICD-10-CM

## 2018-08-13 DIAGNOSIS — I10 Essential (primary) hypertension: Secondary | ICD-10-CM | POA: Diagnosis not present

## 2018-08-13 DIAGNOSIS — Z8249 Family history of ischemic heart disease and other diseases of the circulatory system: Secondary | ICD-10-CM | POA: Diagnosis not present

## 2018-08-13 DIAGNOSIS — E871 Hypo-osmolality and hyponatremia: Secondary | ICD-10-CM | POA: Diagnosis present

## 2018-08-13 DIAGNOSIS — F17210 Nicotine dependence, cigarettes, uncomplicated: Secondary | ICD-10-CM | POA: Diagnosis present

## 2018-08-13 DIAGNOSIS — Z20828 Contact with and (suspected) exposure to other viral communicable diseases: Secondary | ICD-10-CM | POA: Diagnosis present

## 2018-08-13 DIAGNOSIS — I5022 Chronic systolic (congestive) heart failure: Secondary | ICD-10-CM | POA: Diagnosis present

## 2018-08-13 DIAGNOSIS — D649 Anemia, unspecified: Secondary | ICD-10-CM | POA: Diagnosis not present

## 2018-08-13 DIAGNOSIS — I2510 Atherosclerotic heart disease of native coronary artery without angina pectoris: Secondary | ICD-10-CM | POA: Diagnosis present

## 2018-08-13 LAB — SODIUM, URINE, RANDOM: Sodium, Ur: 43 mmol/L

## 2018-08-13 LAB — IRON AND TIBC
Iron: 51 ug/dL (ref 45–182)
Saturation Ratios: 15 % — ABNORMAL LOW (ref 17.9–39.5)
TIBC: 351 ug/dL (ref 250–450)
UIBC: 300 ug/dL

## 2018-08-13 LAB — CBC
HCT: 26.4 % — ABNORMAL LOW (ref 39.0–52.0)
Hemoglobin: 9 g/dL — ABNORMAL LOW (ref 13.0–17.0)
MCH: 30.1 pg (ref 26.0–34.0)
MCHC: 34.1 g/dL (ref 30.0–36.0)
MCV: 88.3 fL (ref 80.0–100.0)
Platelets: 177 10*3/uL (ref 150–400)
RBC: 2.99 MIL/uL — ABNORMAL LOW (ref 4.22–5.81)
RDW: 12.5 % (ref 11.5–15.5)
WBC: 6.4 10*3/uL (ref 4.0–10.5)
nRBC: 0 % (ref 0.0–0.2)

## 2018-08-13 LAB — BASIC METABOLIC PANEL
Anion gap: 12 (ref 5–15)
Anion gap: 12 (ref 5–15)
Anion gap: 9 (ref 5–15)
BUN: 16 mg/dL (ref 8–23)
BUN: 16 mg/dL (ref 8–23)
BUN: 16 mg/dL (ref 8–23)
CO2: 22 mmol/L (ref 22–32)
CO2: 22 mmol/L (ref 22–32)
CO2: 25 mmol/L (ref 22–32)
Calcium: 9.6 mg/dL (ref 8.9–10.3)
Calcium: 9.5 mg/dL (ref 8.9–10.3)
Calcium: 9.6 mg/dL (ref 8.9–10.3)
Chloride: 86 mmol/L — ABNORMAL LOW (ref 98–111)
Chloride: 90 mmol/L — ABNORMAL LOW (ref 98–111)
Chloride: 90 mmol/L — ABNORMAL LOW (ref 98–111)
Creatinine, Ser: 1.64 mg/dL — ABNORMAL HIGH (ref 0.61–1.24)
Creatinine, Ser: 1.39 mg/dL — ABNORMAL HIGH (ref 0.61–1.24)
Creatinine, Ser: 1.51 mg/dL — ABNORMAL HIGH (ref 0.61–1.24)
GFR calc Af Amer: 49 mL/min — ABNORMAL LOW (ref >60)
GFR calc Af Amer: 60 mL/min — ABNORMAL LOW (ref >60)
GFR calc Af Amer: 54 mL/min — ABNORMAL LOW (ref >60)
GFR calc non Af Amer: 42 mL/min — ABNORMAL LOW (ref >60)
GFR calc non Af Amer: 51 mL/min — ABNORMAL LOW (ref >60)
GFR calc non Af Amer: 46 mL/min — ABNORMAL LOW (ref >60)
Glucose, Bld: 106 mg/dL — ABNORMAL HIGH (ref 70–99)
Glucose, Bld: 108 mg/dL — ABNORMAL HIGH (ref 70–99)
Glucose, Bld: 197 mg/dL — ABNORMAL HIGH (ref 70–99)
Potassium: 4.3 mmol/L (ref 3.5–5.1)
Potassium: 3.8 mmol/L (ref 3.5–5.1)
Potassium: 3.3 mmol/L — ABNORMAL LOW (ref 3.5–5.1)
Sodium: 120 mmol/L — ABNORMAL LOW (ref 135–145)
Sodium: 124 mmol/L — ABNORMAL LOW (ref 135–145)
Sodium: 124 mmol/L — ABNORMAL LOW (ref 135–145)

## 2018-08-13 LAB — URINALYSIS, ROUTINE W REFLEX MICROSCOPIC
Bilirubin Urine: NEGATIVE
Glucose, UA: NEGATIVE mg/dL
Hgb urine dipstick: NEGATIVE
Ketones, ur: NEGATIVE mg/dL
Leukocytes,Ua: NEGATIVE
Nitrite: NEGATIVE
Protein, ur: NEGATIVE mg/dL
Specific Gravity, Urine: 1.002 — ABNORMAL LOW (ref 1.005–1.030)
pH: 7 (ref 5.0–8.0)

## 2018-08-13 LAB — OSMOLALITY, URINE: Osmolality, Ur: 140 mOsm/kg — ABNORMAL LOW (ref 300–900)

## 2018-08-13 LAB — OSMOLALITY: Osmolality: 252 mOsm/kg — ABNORMAL LOW (ref 275–295)

## 2018-08-13 LAB — RETICULOCYTES
Immature Retic Fract: 8 % (ref 2.3–15.9)
RBC.: 2.99 MIL/uL — ABNORMAL LOW (ref 4.22–5.81)
Retic Count, Absolute: 34.7 10*3/uL (ref 19.0–186.0)
Retic Ct Pct: 1.2 % (ref 0.4–3.1)

## 2018-08-13 LAB — TSH: TSH: 1.419 u[IU]/mL (ref 0.350–4.500)

## 2018-08-13 LAB — SARS CORONAVIRUS 2 BY RT PCR (HOSPITAL ORDER, PERFORMED IN ~~LOC~~ HOSPITAL LAB): SARS Coronavirus 2: NEGATIVE

## 2018-08-13 LAB — FERRITIN: Ferritin: 27 ng/mL (ref 24–336)

## 2018-08-13 MED ORDER — SODIUM CHLORIDE 0.9% FLUSH
3.0000 mL | Freq: Two times a day (BID) | INTRAVENOUS | Status: DC
  Administered 2018-08-13 – 2018-08-16 (×2): 3 mL via INTRAVENOUS

## 2018-08-13 MED ORDER — RIVAROXABAN 20 MG PO TABS
20.0000 mg | ORAL_TABLET | Freq: Every day | ORAL | Status: DC
  Administered 2018-08-14 – 2018-08-16 (×3): 20 mg via ORAL
  Filled 2018-08-13 (×3): qty 1

## 2018-08-13 MED ORDER — SODIUM CHLORIDE 0.9 % IV BOLUS
500.0000 mL | Freq: Once | INTRAVENOUS | Status: AC
  Administered 2018-08-13: 500 mL via INTRAVENOUS

## 2018-08-13 MED ORDER — AMLODIPINE BESYLATE 10 MG PO TABS
10.0000 mg | ORAL_TABLET | Freq: Every day | ORAL | Status: DC
  Administered 2018-08-14 – 2018-08-16 (×3): 10 mg via ORAL
  Filled 2018-08-13 (×3): qty 1

## 2018-08-13 MED ORDER — ALBUTEROL SULFATE HFA 108 (90 BASE) MCG/ACT IN AERS: 2.0000 | INHALATION_SPRAY | Freq: Four times a day (QID) | RESPIRATORY_TRACT | Status: DC | PRN

## 2018-08-13 MED ORDER — RIVAROXABAN 10 MG PO TABS: 10.0000 mg | ORAL_TABLET | Freq: Every day | ORAL | Status: DC

## 2018-08-13 MED ORDER — POTASSIUM CHLORIDE CRYS ER 20 MEQ PO TBCR
40.0000 meq | EXTENDED_RELEASE_TABLET | Freq: Once | ORAL | Status: AC
  Administered 2018-08-14: 40 meq via ORAL
  Filled 2018-08-13: qty 2

## 2018-08-13 MED ORDER — SENNOSIDES-DOCUSATE SODIUM 8.6-50 MG PO TABS
1.0000 | ORAL_TABLET | Freq: Every evening | ORAL | Status: DC | PRN
  Administered 2018-08-15: 1 via ORAL
  Filled 2018-08-13: qty 1

## 2018-08-13 MED ORDER — ATORVASTATIN CALCIUM 80 MG PO TABS
80.0000 mg | ORAL_TABLET | Freq: Every day | ORAL | Status: DC
  Administered 2018-08-14 – 2018-08-16 (×3): 80 mg via ORAL
  Filled 2018-08-13 (×3): qty 1

## 2018-08-13 MED ORDER — CARVEDILOL 12.5 MG PO TABS
12.5000 mg | ORAL_TABLET | Freq: Two times a day (BID) | ORAL | Status: DC
  Administered 2018-08-13 – 2018-08-16 (×6): 12.5 mg via ORAL
  Filled 2018-08-13 (×6): qty 1

## 2018-08-13 MED ORDER — GABAPENTIN 300 MG PO CAPS
300.0000 mg | ORAL_CAPSULE | Freq: Three times a day (TID) | ORAL | Status: DC
  Administered 2018-08-13 – 2018-08-16 (×8): 300 mg via ORAL
  Filled 2018-08-13 (×8): qty 1

## 2018-08-13 MED ORDER — SODIUM CHLORIDE 0.9% FLUSH
3.0000 mL | Freq: Once | INTRAVENOUS | Status: AC
  Administered 2018-08-13: 3 mL via INTRAVENOUS

## 2018-08-13 MED ORDER — ACETAMINOPHEN 325 MG PO TABS: 650.0000 mg | ORAL_TABLET | Freq: Four times a day (QID) | ORAL | Status: DC | PRN

## 2018-08-13 MED ORDER — ACETAMINOPHEN 650 MG RE SUPP: 650.0000 mg | Freq: Four times a day (QID) | RECTAL | Status: DC | PRN

## 2018-08-13 MED ORDER — ALBUTEROL SULFATE (2.5 MG/3ML) 0.083% IN NEBU: 2.5000 mg | INHALATION_SOLUTION | Freq: Four times a day (QID) | RESPIRATORY_TRACT | Status: DC | PRN

## 2018-08-13 NOTE — Progress Notes (Signed)
New Admission Note:  Arrival Method: Stretcher Mental Orientation: Alert and oriented x 4 Telemetry: 18 Assessment: Completed Skin: Warm and dry IV: NSL Pain: Denies  Tubes: N/A Safety Measures: Safety Fall Prevention Plan initiated.  Admission: Completed 5 M  Orientation: Patient has been orientated to the room, unit and the staff. Welcome booklet given.  Family:N/A  Orders have been reviewed and implemented. Will continue to monitor the patient. Call light has been placed within reach and bed alarm has been activated.   Sima Matas BSN, RN  Phone Number: [phone]

## 2018-08-13 NOTE — ED Notes (Signed)
ED TO INPATIENT HANDOFF REPORT  ED Nurse Name and Phone #: Annie Main [phone]  S Name/Age/Gender Caleb [name] 68 y.o. male Room/Bed: 017C/017C  Code Status   Code Status: Prior  Home/SNF/Other Home Patient oriented to: self, place, time and situation Is this baseline? Yes   Triage Complete: Triage complete  Chief Complaint Declining kidney function  Triage Note Pt went to pcp yesterday for generalized weakness, sent here today due to abnormal labs and decreased renal function. Pt is in no distress and no other complaints at triage.   Allergies Allergies  Allergen Reactions  . Sulfamethoxazole-Trimethoprim Other (See Comments)  . Cilostazol Swelling  . Lisinopril Other (See Comments)    Unknown    Level of Care/Admitting Diagnosis ED Disposition    ED Disposition Condition Comment   Admit  The patient appears reasonably stabilized for admission considering the current resources, flow, and capabilities available in the ED at this time, and I doubt any other [facility] requiring further screening and/or treatment in the ED prior to admission is  present.       B Medical/Surgery History Past Medical History:  Diagnosis Date  . Acute respiratory failure with hypoxia ([place])   . Acute systolic congestive heart failure ([place]) 02/11/2014  . COPD (chronic obstructive pulmonary disease) ([place]) 02/11/2014  . Essential hypertension   . Hypercholesteremia   . 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 Tobacco abuse    Past Surgical History:  Procedure Laterality Date  . CORONARY ARTERY BYPASS GRAFT N/A 02/17/2014   Procedure: CORONARY ARTERY BYPASS GRAFTING (CABG);  Surgeon: Rexene Alberts, MD;  Location: [place];  Service: Open Heart Surgery;  Laterality: N/A;  Times 3 using bilateral mammary arteries and endoscopically harvested right saphenous vein  . INTRAOPERATIVE TRANSESOPHAGEAL ECHOCARDIOGRAM N/A 02/17/2014   Procedure: INTRAOPERATIVE  TRANSESOPHAGEAL ECHOCARDIOGRAM;  Surgeon: Rexene Alberts, MD;  Location: [place];  Service: Open Heart Surgery;  Laterality: N/A;  . LEFT HEART CATHETERIZATION WITH CORONARY ANGIOGRAM N/A 02/12/2014   Procedure: LEFT HEART CATHETERIZATION WITH CORONARY ANGIOGRAM;  Surgeon: Sinclair Grooms, MD;  Location: [facility] CATH LAB;  Service: Cardiovascular;  Laterality: N/A;     A IV Location/Drains/Wounds Patient Lines/Drains/Airways Status   Active Line/Drains/Airways    Name:   Placement date:   Placement time:   Site:   Days:   Peripheral IV 02/17/14 Left Forearm   02/17/14    0745    Forearm   1638   Peripheral IV 02/17/14 Right Forearm   02/17/14    0740    Forearm   1638   Peripheral IV 05/21/14 Left Forearm   05/21/14    1031    Forearm   1545   Peripheral IV 08/13/18 Right Forearm   08/13/18    1548    Forearm   less than 1   Incision (Closed) 02/17/14 Chest   02/17/14    0947     1638   Incision (Closed) 02/17/14 Leg Right   02/17/14    0947     1638          Intake/Output Last 24 hours No intake or output data in the 24 hours ending 08/13/18 1649  Labs/Imaging Results for orders placed or performed during the hospital encounter of 08/13/18 (from the past 48 hour(s))  Basic metabolic panel     Status: Abnormal   Collection Time: 08/13/18  1:16  PM  Result Value Ref Range   Sodium 120 (L) 135 - 145 mmol/L   Potassium 4.3 3.5 - 5.1 mmol/L   Chloride 86 (L) 98 - 111 mmol/L   CO2 22 22 - 32 mmol/L   Glucose, Bld 106 (H) 70 - 99 mg/dL   BUN 16 8 - 23 mg/dL   Creatinine, Ser 1.64 (H) 0.61 - 1.24 mg/dL   Calcium 9.6 8.9 - 10.3 mg/dL   GFR calc non Af Amer 42 (L) >60 mL/min   GFR calc Af Amer 49 (L) >60 mL/min   Anion gap 12 5 - 15    Comment: Performed at [facility] [address] [place], [place] [address]  CBC     Status: Abnormal   Collection Time: 08/13/18  1:16 PM  Result Value Ref Range   WBC 6.4 4.0 - 10.5 K/uL   RBC 2.99 (L) 4.22 - 5.81 MIL/uL   Hemoglobin 9.0 (L)  13.0 - 17.0 g/dL   HCT 26.4 (L) 39.0 - 52.0 %   MCV 88.3 80.0 - 100.0 fL   MCH 30.1 26.0 - 34.0 pg   MCHC 34.1 30.0 - 36.0 g/dL   RDW 12.5 11.5 - 15.5 %   Platelets 177 150 - 400 K/uL   nRBC 0.0 0.0 - 0.2 %    Comment: Performed at [facility], [place] [address] [place], [place] [address]  Urinalysis, Routine w reflex microscopic     Status: Abnormal   Collection Time: 08/13/18  3:43 PM  Result Value Ref Range   Color, Urine STRAW (A) YELLOW   APPearance CLEAR CLEAR   Specific Gravity, Urine 1.002 (L) 1.005 - 1.030   pH 7.0 5.0 - 8.0   Glucose, UA NEGATIVE NEGATIVE mg/dL   Hgb urine dipstick NEGATIVE NEGATIVE   Bilirubin Urine NEGATIVE NEGATIVE   Ketones, ur NEGATIVE NEGATIVE mg/dL   Protein, ur NEGATIVE NEGATIVE mg/dL   Nitrite NEGATIVE NEGATIVE   Leukocytes,Ua NEGATIVE NEGATIVE    Comment: Performed at [facility] [address] [place], [place] [address]   No results found.  Pending Labs Unresulted Labs (From admission, onward)    Start     Ordered   08/13/18 1615  Sodium, urine, random  Add-on,   AD     08/13/18 1614   08/13/18 1615  Osmolality, urine  Add-on,   AD     08/13/18 1614   08/13/18 1615  Osmolality  Add-on,   AD     08/13/18 1614   08/13/18 2440  Basic metabolic panel  STAT Now then every 4 hours ,   R (with STAT occurrences)     08/13/18 1614   08/13/18 1513  SARS Coronavirus 2 (CEPHEID - Performed in [facility] hospital lab), Hosp Order  (Asymptomatic Patients Labs)  Once,   STAT    Question:  Rule Out  Answer:  Yes   08/13/18 1513          Vitals/Pain Today's Vitals   08/13/18 1515 08/13/18 1530 08/13/18 1545 08/13/18 1600  BP: (!) 177/71 (!) 147/66 (!) 153/72 (!) 174/63  Pulse: 71 69 69 76  Resp: 18 16 18 20   Temp:      TempSrc:      SpO2: 100% 99% 100% 96%  PainSc:        Isolation Precautions No active isolations  Medications Medications  sodium chloride flush (NS) 0.9 % injection 3 mL (has no administration in time  range)  sodium chloride 0.9 % bolus 500 mL (500 mLs Intravenous New Bag/Given 08/13/18 1608)    Mobility walks Low fall risk   Focused Assessments Hyponatremia   R Recommendations: See Admitting Provider Note  Report given to:   Additional Notes:

## 2018-08-13 NOTE — Progress Notes (Signed)
Called ER RN for report. Room ready.  

## 2018-08-13 NOTE — ED Triage Notes (Signed)
Pt went to pcp yesterday for generalized weakness, sent here today due to abnormal labs and decreased renal function. Pt is in no distress and no other complaints at triage.

## 2018-08-13 NOTE — ED Provider Notes (Signed)
[facility] EMERGENCY DEPARTMENT Provider Note   CSN: [id number] Arrival date & time: 08/13/18  1209    History   Chief Complaint Chief Complaint  Patient presents with  . Abnormal Lab    HPI Caleb [name] is a 68 y.o. male.     HPI  Patient presents via POV for evaluation of lab abnormality and leg weakness for the last 1 to 2 weeks.  He was seen by his PCP yesterday with complaint of feeling fatigued and lower extremity weakness.  It has been gradual in onset and symmetric.  Labs at PCPs were notable for abnormalities and he was called and directed to come here.  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  Fans only for the last several weeks it is been especially hot.  Has been adherent to his medication regimen without skipping doses.  Does report chronic nonproductive cough that is nagging and he attributes to smoking.  Denies significant weight loss or night sweats.  Past Medical History:  Diagnosis Date  . Acute respiratory failure with hypoxia ([place])   . Acute systolic congestive heart failure ([place]) 02/11/2014  . COPD (chronic obstructive pulmonary disease) ([place]) 02/11/2014  . Essential hypertension   . Hypercholesteremia   . 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 Tobacco abuse     Patient Active Problem List   Diagnosis Date Noted  . Hyponatremia 08/13/2018  . Renal artery stenosis ([place]) 10/22/2017  . 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 Congestive heart disease ([place])   . Coronary artery disease involving native coronary artery of native heart without angina pectoris   . Coronary artery disease involving native coronary artery of native heart with other form of angina pectoris ([place])   . Tobacco abuse   . Essential hypertension   . Hypercholesteremia   . Lactic acidosis 02/12/2014  . Sinus tachycardia 02/12/2014  . Elevated troponin   . COPD (chronic obstructive pulmonary  disease) ([place]) 02/11/2014  . COPD with acute exacerbation ([place]) 02/11/2014  . Demand ischemia of myocardium ([place]) 02/11/2014  . Abnormal EKG 02/11/2014  . Acute systolic congestive heart failure ([place]) 02/11/2014    Past Surgical History:  Procedure Laterality Date  . CORONARY ARTERY BYPASS GRAFT N/A 02/17/2014   Procedure: CORONARY ARTERY BYPASS GRAFTING (CABG);  Surgeon: Rexene Alberts, MD;  Location: [facility];  Service: Open Heart Surgery;  Laterality: N/A;  Times 3 using bilateral mammary arteries and endoscopically harvested right saphenous vein  . INTRAOPERATIVE TRANSESOPHAGEAL ECHOCARDIOGRAM N/A 02/17/2014   Procedure: INTRAOPERATIVE TRANSESOPHAGEAL ECHOCARDIOGRAM;  Surgeon: Rexene Alberts, MD;  Location: [facility];  Service: Open Heart Surgery;  Laterality: N/A;  . LEFT HEART CATHETERIZATION WITH CORONARY ANGIOGRAM N/A 02/12/2014   Procedure: LEFT HEART CATHETERIZATION WITH CORONARY ANGIOGRAM;  Surgeon: Sinclair Grooms, MD;  Location: [facility] CATH LAB;  Service: Cardiovascular;  Laterality: N/A;        Home Medications    Prior to Admission medications   Medication Sig Start Date End Date Taking? Authorizing Provider  acetaminophen (TYLENOL) 500 MG tablet Take 500 mg by mouth 2 (two) times a day.    Yes [provider]  albuterol (VENTOLIN HFA) 108 (90 Base) MCG/ACT inhaler Inhale 2 puffs into the lungs every 6 (six) hours as needed for wheezing or shortness of breath. 07/24/18  Yes Laurin Coder, MD  amLODipine (NORVASC) 10 MG tablet Take 1 tablet (10 mg total) by mouth daily. 03/11/17  Yes Josue Hector, MD  atorvastatin (LIPITOR) 80 MG tablet Take 1 tablet (80 mg total) by mouth daily. 12/14/15  Yes Josue Hector, MD  carvedilol (COREG) 12.5 MG tablet Take 1 tablet (12.5 mg total) by mouth 2 (two) times daily. 12/27/15  Yes Josue Hector, MD  Cholecalciferol (VITAMIN D-3 PO) Take 1 capsule by mouth daily with breakfast.   Yes [provider]  gabapentin  (NEURONTIN) 300 MG capsule Take 300 mg by mouth 3 (three) times daily.   Yes [provider]  losartan-hydrochlorothiazide (HYZAAR) 50-12.5 MG tablet Take 1 tablet by mouth daily. 03/14/18  Yes Josue Hector, MD  vitamin B-12 (CYANOCOBALAMIN) 1000 MCG tablet Take 1,000 mcg by mouth daily with breakfast.   Yes [provider]  XARELTO 20 MG TABS tablet Take 20 mg by mouth daily before breakfast. 06/13/18  Yes [provider]    Family History Family History  Problem Relation Age of Onset  . Heart attack Mother   . Diabetes Mother   . Heart attack Brother   . Diabetes Brother   . Cancer Sister   . Stroke Neg Hx     Social History Social History   Tobacco Use  . Smoking status: Current Every Day Smoker    Packs/day: 0.50    Years: 35.00    Pack years: 17.50    Types: Cigarettes    Last attempt to quit: 02/11/2014    Years since quitting: 4.5  . Smokeless tobacco: Never Used  . Tobacco comment: has quit twice before   Substance Use Topics  . Alcohol use: Yes    Alcohol/week: 0.0 standard drinks    Comment: Six pack a day of beer  . Drug use: Not on file     Allergies   Sulfamethoxazole-trimethoprim, Cilostazol, and Lisinopril   Review of Systems Review of Systems  Constitutional: Positive for fatigue.  Respiratory: Positive for cough.   Neurological: Positive for weakness.  All other systems reviewed and are negative.    Physical Exam Updated Vital Signs BP (!) 146/66 (BP Location: Left Arm)   Pulse 71   Temp 99.1 F (37.3 C) (Oral)   Resp 19   SpO2 100%   Physical Exam Vitals signs and nursing note reviewed.  Constitutional:      Appearance: He is well-developed.  HENT:     Head: Normocephalic and atraumatic.  Eyes:     Conjunctiva/sclera: Conjunctivae normal.  Neck:     Musculoskeletal: Neck supple.  Cardiovascular:     Rate and Rhythm: Normal rate and regular rhythm.     Heart sounds: Gallop present.   Pulmonary:      Effort: Pulmonary effort is normal. No respiratory distress.     Breath sounds: Normal breath sounds.  Abdominal:     Palpations: Abdomen is soft.     Tenderness: There is no abdominal tenderness.  Musculoskeletal:     Right lower leg: Edema present.     Left lower leg: Edema present.     Comments: Lower extremity edema, right worse than left, compression stocking in place on right.  He reports this is baseline.  Skin:    General: Skin is warm and dry.  Neurological:     Mental Status: He is alert.  Psychiatric:        Mood and Affect: Mood normal.      ED Treatments /  Results  Labs (all labs ordered are listed, but only abnormal results are displayed) Labs Reviewed  BASIC METABOLIC PANEL - Abnormal; Notable for the following components:      Result Value   Sodium 120 (*)    Chloride 86 (*)    Glucose, Bld 106 (*)    Creatinine, Ser 1.64 (*)    GFR calc non Af Amer 42 (*)    GFR calc Af Amer 49 (*)    All other components within normal limits  CBC - Abnormal; Notable for the following components:   RBC 2.99 (*)    Hemoglobin 9.0 (*)    HCT 26.4 (*)    All other components within normal limits  URINALYSIS, ROUTINE W REFLEX MICROSCOPIC - Abnormal; Notable for the following components:   Color, Urine STRAW (*)    Specific Gravity, Urine 1.002 (*)    All other components within normal limits  OSMOLALITY, URINE - Abnormal; Notable for the following components:   Osmolality, Ur 140 (*)    All other components within normal limits  OSMOLALITY - Abnormal; Notable for the following components:   Osmolality 252 (*)    All other components within normal limits  BASIC METABOLIC PANEL - Abnormal; Notable for the following components:   Sodium 124 (*)    Chloride 90 (*)    Glucose, Bld 108 (*)    Creatinine, Ser 1.39 (*)    GFR calc non Af Amer 51 (*)    GFR calc Af Amer 60 (*)    All other components within normal limits  BASIC METABOLIC PANEL - Abnormal; Notable for the  following components:   Sodium 124 (*)    Potassium 3.3 (*)    Chloride 90 (*)    Glucose, Bld 197 (*)    Creatinine, Ser 1.51 (*)    GFR calc non Af Amer 46 (*)    GFR calc Af Amer 54 (*)    All other components within normal limits  IRON AND TIBC - Abnormal; Notable for the following components:   Saturation Ratios 15 (*)    All other components within normal limits  RETICULOCYTES - Abnormal; Notable for the following components:   RBC. 2.99 (*)    All other components within normal limits  SARS CORONAVIRUS 2 (HOSPITAL ORDER, [facility] LAB)  SODIUM, URINE, RANDOM  TSH  FERRITIN  BASIC METABOLIC PANEL  HIV ANTIBODY (ROUTINE TESTING W REFLEX)  BASIC METABOLIC PANEL    EKG EKG Interpretation  Date/Time:  Wednesday August 13 2018 13:12:49 EDT Ventricular Rate:  66 PR Interval:  196 QRS Duration: 98 QT Interval:  440 QTC Calculation: 461 R Axis:   59 Text Interpretation:  Sinus rhythm with occasional Premature ventricular complexes Possible Inferior infarct , age undetermined Abnormal ECG Confirmed by Pattricia Boss [phone]) on 08/13/2018 3:55:30 PM   Radiology No results found.  Procedures Procedures (including critical care time)  Medications Ordered in ED Medications  amLODipine (NORVASC) tablet 10 mg (has no administration in time range)  carvedilol (COREG) tablet 12.5 mg (12.5 mg Oral Given 08/13/18 2142)  atorvastatin (LIPITOR) tablet 80 mg (has no administration in time range)  gabapentin (NEURONTIN) capsule 300 mg (300 mg Oral Given 08/13/18 2142)  sodium chloride flush (NS) 0.9 % injection 3 mL (3 mLs Intravenous Given 08/13/18 2143)  senna-docusate (Senokot-S) tablet 1 tablet (has no administration in time range)  acetaminophen (TYLENOL) tablet 650 mg (has no administration in time range)  Or  acetaminophen (TYLENOL) suppository 650 mg (has no administration in time range)  albuterol (PROVENTIL) (2.5 MG/3ML) 0.083% nebulizer solution 2.5  mg (has no administration in time range)  rivaroxaban (XARELTO) tablet 20 mg (has no administration in time range)  sodium chloride flush (NS) 0.9 % injection 3 mL (3 mLs Intravenous Given 08/13/18 1726)  sodium chloride 0.9 % bolus 500 mL (0 mLs Intravenous Stopped 08/13/18 1727)  potassium chloride SA (K-DUR) CR tablet 40 mEq (40 mEq Oral Given 08/14/18 0021)     Initial Impression / Assessment and Plan / ED Course  I have reviewed the triage vital signs and the nursing notes.  Pertinent labs & imaging results that were available during my care of the patient were reviewed by me and considered in my medical decision making (see chart for details).        Mr. Reckart is a 68 year old gentleman with a history of CAD status post bypass with CHF on chronic anticoagulation with Xarelto, carvedilol, amlodipine, gabapentin, and losartan-hydrochlorothiazide.  Overall his labs and history are most consistent with dehydration leading to AKI and hyponatremia.  I suspect this is compounded by his antihypertensive/diuretics.  His creatinine is elevated and sodium is 120.  Instructed for admission.  While in the emergency department I gave 500 cc of normal saline for hydration and to begin correction of sodium.  Discussed with inpatient medicine service who is in agreement and admitted patient for inpatient treatment.  Patient seen in conjunction with Dr. Jeanell Sparrow.  Final Clinical Impressions(s) / ED Diagnoses   Final diagnoses:  Hyponatremia  AKI (acute kidney injury) [facility])  Dehydration    ED Discharge Orders    None       Tillie Fantasia, MD 08/14/18 Greggory Keen    Pattricia Boss, MD 08/14/18 [phone]

## 2018-08-13 NOTE — ED Notes (Signed)
No answer in lobby.

## 2018-08-13 NOTE — H&P (Addendum)
Date: 08/13/2018               Patient Name:  Caleb [name] MRN: [id number]  DOB: 10-20-1950 Age / Sex: 68 y.o., male   PCP: Caleb Sacramento, MD              Medical Service: Internal Medicine Teaching Service              Attending Physician: Dr. Rebeca Alert Raynaldo Opitz, MD    First Contact: Caleb [name], MS4 Pager: [phone]  Second Contact: Dr. Trilby [name] Pager: [phone]            After Hours (After 5p/  First Contact Pager: [phone]  weekends / holidays): Second Contact Pager: [phone]   Chief Complaint: Leg weakness  History of Present Illness:  Caleb [name] is a 68 yo male with history of HTN, COPD, CABGx3, CAD, RAS who presented with bilateral lower extremity weakness which developed over the last few days. He works as a Furniture conservator/restorer and it has been particularly hot and he has been sweating a lot in the last two weeks. He has been drinking water but does not feel he has been drinking enough. He reports having two falls in this time period but did not lose consciousness or hit his head. No pain or new numbness in his lower extremities. No weakness in any other region of his body. 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 He has been taking his home medications regularly including HCTZ.  ROS: negative for confusion, headache, dizziness, SOB, chest pain,  abd pain, nausea, vomiting, diarrhea, constipation, dysuria, reduced urine, chills. Patient has been taking his medications per ordered.   Meds:  Current Meds  Medication Sig  . acetaminophen (TYLENOL) 500 MG tablet Take 500 mg by mouth 2 (two) times a day.   . albuterol (VENTOLIN HFA) 108 (90 Base) MCG/ACT inhaler Inhale 2 puffs into the lungs every 6 (six) hours as needed for wheezing or shortness of breath.  [name] amLODipine (NORVASC) 10 MG tablet Take 1 tablet (10 mg  total) by mouth daily.  [name] atorvastatin (LIPITOR) 80 MG tablet Take 1 tablet (80 mg total) by mouth daily.  . carvedilol (COREG) 12.5 MG tablet Take 1 tablet (12.5 mg total) by mouth 2 (two) times daily.  . Cholecalciferol (VITAMIN D-3 PO) Take 1 capsule by mouth daily with breakfast.  . gabapentin (NEURONTIN) 300 MG capsule Take 300 mg by mouth 3 (three) times daily.  [name] losartan-hydrochlorothiazide (HYZAAR) 50-12.5 MG tablet Take 1 tablet by mouth daily.  . vitamin B-12 (CYANOCOBALAMIN) 1000 MCG tablet Take 1,000 mcg by mouth daily with breakfast.  . XARELTO 20 MG TABS tablet Take 20 mg by mouth daily before breakfast.   Allergies: Allergies as of 08/13/2018 - Review Complete 08/13/2018  Allergen Reaction Noted  . Sulfamethoxazole-trimethoprim Other (See Comments) 05/31/2017  . Cilostazol Swelling 04/12/2017  . Lisinopril Cough 05/10/2015   Past Medical History:  Diagnosis Date  . Acute respiratory failure with hypoxia ([place])   . Acute systolic congestive heart failure ([place]) 02/11/2014  . COPD (chronic obstructive pulmonary disease) ([place]) 02/11/2014  . Essential hypertension   . Hypercholesteremia   . 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 Tobacco abuse    Family  History:  Mother had bypass surgery and DM.  Brother has Multiple Sclerosis.  Other siblings have heart disease.    Social History: Patient is here with his daughter, Caleb [name]. He lives alone with his two cats and two dogs. Caleb [name] lives nearby. He smokes half a pack of cigarettes daily. Drinks two tall whiskey bottles daily. No other substance use.   Review of Systems: A complete ROS was negative except as per HPI.   Physical Exam: Blood pressure (!) 168/63, pulse 73, temperature 98.9 F (37.2 C), temperature source Oral, resp. rate 17, SpO2 99 %.  Physical Exam  Constitutional: He appears well-developed and well-nourished. No distress.  HENT:  Head: Normocephalic and  atraumatic.  Mouth/Throat: Oropharynx is clear and moist.  Eyes: EOM are normal. Right eye exhibits no discharge. Left eye exhibits no discharge. No scleral icterus.  Neck: Normal range of motion.  Cardiovascular: Normal rate, regular rhythm and normal heart sounds.  No murmur heard. Chronic LE Edema  Respiratory: Effort normal and breath sounds normal. No respiratory distress. He has no wheezes. He exhibits no tenderness.  GI: Soft. Bowel sounds are normal. He exhibits no distension. There is no abdominal tenderness. There is no rebound and no guarding.  Musculoskeletal: Normal range of motion.     Comments: LE: 5/5 strengths in knee extension, knee flexion, plantar flexion, dorsiflexion bilaterally;   Neurological: He is alert. Coordination normal.  Skin: Skin is warm and dry. He is not diaphoretic.  Psychiatric: He has a normal mood and affect. His behavior is normal.   EKG: no EKGs during admission  CXR: no CXR during admission  Assessment & Plan by Problem: Active Problems:   Hyponatremia Caleb [name] is a 68 yo male with history of HTN, COPD, CABGx3, CAD, RAS who presented with bilateral lower extremity weakness which developed over the last few days.  Hypotonic Hyponatermia The patient's sodium on admission was 120 without any significant hyperglycemia. After 3 hours, Sodium increased to 124 after pt received 500 mL NS. Serum Osm 252 along with UOsm of 140 is concerning for hypovolemia. Clinical presentation along with reported history of excess sweating and working in a hot outside environment along with home HCTZ is suggestive of hypovolemic hyponatremia from extrarenal losses. -BMP q4h -Slow repletion with isotonic IVF, With Rate of increase >7 per day  Normocytic Anemia In the ED, pt's Hgb was 9.0. Per Chart Review, patient's Hgb ranged between 8.2-9.6. Low suspicion for acute GI blood loss. Possible etiology is his underlying kidney disease (moderate left renal artery stenosis)  -Iron Panel and Retic Count for further anemia workup  AKI Initial Cr in the ED was 1.64. After 3 hours and s/p IVF, Cr is down to 1.39. Patient's baseline Cr is between 0.93-1.15. Given history of dehydration, low fluid intake, and decreasing Cr, most likely prerenal AKI. - Continue slow repletion of fluids with IVF - Closely monitor  HTN and RAS: -Continue home Amlodipine 10mg  -Continue home carvedilol 12.5mg   COPD: -Albuterol q6h prn  Peripheral Neuropathy: -Continue home gabapentin 300mg   Diet: , Senna PRN for constipation DVT prophylaxis: Home Xarelto 10mg    CODE Status: Full code  Dispo: Admit patient to Inpatient with expected length of stay greater than 2 midnights.  Signed: Zara [name], Medical Student 08/13/2018, 7:21 PM  Pager: [phone]  Attestation for Student Documentation:  I personally was present and performed or re-performed the history, physical exam and medical decision-making activities of this service and have verified that the service and findings are  accurately documented in the student's note with some additions/corrections.  Neva Seat, MD 08/13/2018, 8:36 PM

## 2018-08-14 DIAGNOSIS — E871 Hypo-osmolality and hyponatremia: Principal | ICD-10-CM

## 2018-08-14 DIAGNOSIS — Z951 Presence of aortocoronary bypass graft: Secondary | ICD-10-CM

## 2018-08-14 DIAGNOSIS — J449 Chronic obstructive pulmonary disease, unspecified: Secondary | ICD-10-CM

## 2018-08-14 DIAGNOSIS — N179 Acute kidney failure, unspecified: Secondary | ICD-10-CM | POA: Diagnosis present

## 2018-08-14 DIAGNOSIS — G629 Polyneuropathy, unspecified: Secondary | ICD-10-CM

## 2018-08-14 DIAGNOSIS — D509 Iron deficiency anemia, unspecified: Secondary | ICD-10-CM | POA: Diagnosis present

## 2018-08-14 DIAGNOSIS — I701 Atherosclerosis of renal artery: Secondary | ICD-10-CM

## 2018-08-14 DIAGNOSIS — Z7901 Long term (current) use of anticoagulants: Secondary | ICD-10-CM

## 2018-08-14 DIAGNOSIS — I2510 Atherosclerotic heart disease of native coronary artery without angina pectoris: Secondary | ICD-10-CM

## 2018-08-14 DIAGNOSIS — Z79899 Other long term (current) drug therapy: Secondary | ICD-10-CM

## 2018-08-14 DIAGNOSIS — D649 Anemia, unspecified: Secondary | ICD-10-CM

## 2018-08-14 DIAGNOSIS — I10 Essential (primary) hypertension: Secondary | ICD-10-CM

## 2018-08-14 LAB — BASIC METABOLIC PANEL
Anion gap: 10 (ref 5–15)
Anion gap: 8 (ref 5–15)
Anion gap: 10 (ref 5–15)
Anion gap: 11 (ref 5–15)
Anion gap: 13 (ref 5–15)
BUN: 16 mg/dL (ref 8–23)
BUN: 15 mg/dL (ref 8–23)
BUN: 13 mg/dL (ref 8–23)
BUN: 14 mg/dL (ref 8–23)
BUN: 14 mg/dL (ref 8–23)
CO2: 25 mmol/L (ref 22–32)
CO2: 25 mmol/L (ref 22–32)
CO2: 24 mmol/L (ref 22–32)
CO2: 23 mmol/L (ref 22–32)
CO2: 23 mmol/L (ref 22–32)
Calcium: 9.3 mg/dL (ref 8.9–10.3)
Calcium: 9 mg/dL (ref 8.9–10.3)
Calcium: 9.1 mg/dL (ref 8.9–10.3)
Calcium: 8.9 mg/dL (ref 8.9–10.3)
Calcium: 8.9 mg/dL (ref 8.9–10.3)
Chloride: 92 mmol/L — ABNORMAL LOW (ref 98–111)
Chloride: 93 mmol/L — ABNORMAL LOW (ref 98–111)
Chloride: 90 mmol/L — ABNORMAL LOW (ref 98–111)
Chloride: 90 mmol/L — ABNORMAL LOW (ref 98–111)
Chloride: 87 mmol/L — ABNORMAL LOW (ref 98–111)
Creatinine, Ser: 1.35 mg/dL — ABNORMAL HIGH (ref 0.61–1.24)
Creatinine, Ser: 1.32 mg/dL — ABNORMAL HIGH (ref 0.61–1.24)
Creatinine, Ser: 1.27 mg/dL — ABNORMAL HIGH (ref 0.61–1.24)
Creatinine, Ser: 1.36 mg/dL — ABNORMAL HIGH (ref 0.61–1.24)
Creatinine, Ser: 1.32 mg/dL — ABNORMAL HIGH (ref 0.61–1.24)
GFR calc Af Amer: >60 mL/min (ref >60)
GFR calc Af Amer: >60 mL/min (ref >60)
GFR calc Af Amer: >60 mL/min (ref >60)
GFR calc Af Amer: >60 mL/min (ref >60)
GFR calc Af Amer: >60 mL/min (ref >60)
GFR calc non Af Amer: 53 mL/min — ABNORMAL LOW (ref >60)
GFR calc non Af Amer: 55 mL/min — ABNORMAL LOW (ref >60)
GFR calc non Af Amer: 57 mL/min — ABNORMAL LOW (ref >60)
GFR calc non Af Amer: 53 mL/min — ABNORMAL LOW (ref >60)
GFR calc non Af Amer: 55 mL/min — ABNORMAL LOW (ref >60)
Glucose, Bld: 90 mg/dL (ref 70–99)
Glucose, Bld: 194 mg/dL — ABNORMAL HIGH (ref 70–99)
Glucose, Bld: 154 mg/dL — ABNORMAL HIGH (ref 70–99)
Glucose, Bld: 116 mg/dL — ABNORMAL HIGH (ref 70–99)
Glucose, Bld: 129 mg/dL — ABNORMAL HIGH (ref 70–99)
Potassium: 4 mmol/L (ref 3.5–5.1)
Potassium: 3.7 mmol/L (ref 3.5–5.1)
Potassium: 3.5 mmol/L (ref 3.5–5.1)
Potassium: 3.9 mmol/L (ref 3.5–5.1)
Potassium: 3.7 mmol/L (ref 3.5–5.1)
Sodium: 127 mmol/L — ABNORMAL LOW (ref 135–145)
Sodium: 126 mmol/L — ABNORMAL LOW (ref 135–145)
Sodium: 124 mmol/L — ABNORMAL LOW (ref 135–145)
Sodium: 124 mmol/L — ABNORMAL LOW (ref 135–145)
Sodium: 123 mmol/L — ABNORMAL LOW (ref 135–145)

## 2018-08-14 LAB — HIV ANTIBODY (ROUTINE TESTING W REFLEX): HIV Screen 4th Generation wRfx: NONREACTIVE

## 2018-08-14 MED ORDER — DEXTROSE 5 % IV SOLN
INTRAVENOUS | Status: DC
  Administered 2018-08-14: 06:00:00 via INTRAVENOUS

## 2018-08-14 MED ORDER — VITAMIN B-1 100 MG PO TABS
100.0000 mg | ORAL_TABLET | Freq: Every day | ORAL | Status: DC
  Administered 2018-08-14 – 2018-08-16 (×3): 100 mg via ORAL
  Filled 2018-08-14 (×3): qty 1

## 2018-08-14 MED ORDER — SODIUM CHLORIDE 0.9 % IV SOLN
INTRAVENOUS | Status: AC
  Administered 2018-08-14: 19:00:00 via INTRAVENOUS

## 2018-08-14 MED ORDER — FOLIC ACID 1 MG PO TABS
1.0000 mg | ORAL_TABLET | Freq: Every day | ORAL | Status: DC
  Administered 2018-08-14 – 2018-08-16 (×3): 1 mg via ORAL
  Filled 2018-08-14 (×3): qty 1

## 2018-08-14 MED ORDER — SODIUM CHLORIDE 0.9 % IV SOLN: INTRAVENOUS | Status: AC

## 2018-08-14 MED ORDER — ADULT MULTIVITAMIN W/MINERALS CH
1.0000 | ORAL_TABLET | Freq: Every day | ORAL | Status: DC
  Administered 2018-08-14 – 2018-08-16 (×3): 1 via ORAL
  Filled 2018-08-14 (×3): qty 1

## 2018-08-14 NOTE — Progress Notes (Addendum)
Subjective: Patient reports feeling better today. Not experiencing any significant weakness. Was able to ambulate on his own and go to restroom this AM. No nausea or vomiting. Reports no concerns or questions for the team. Spoke to patient's daughter, Levada Dy, over phone and updated her with our impression and plans for the day. She reports that she is aware patient has anemia from outpatient providers. However, she does not recall if they were told the reason for anemia. She reports patient never took iron supplements before for anemia or otherwise.   Objective: Vital signs in last 24 hours: Vitals:   08/13/18 1957 08/14/18 0529 08/14/18 0849 08/14/18 1234  BP: (!) 146/66 130/62 (!) 129/45 (!) 129/45  Pulse: 71 65 (!) 57 (!) 57  Resp: 19 16 18 18   Temp: 99.1 F (37.3 C) 99.3 F (37.4 C)  99.3 F (37.4 C)  TempSrc: Oral Oral  Oral  SpO2: 100% 97% 98%   Weight:  65.5 kg  65.5 kg  Height:    5\' 4"  (1.626 m)   Weight change:   Intake/Output Summary (Last 24 hours) at 08/14/2018 1710 Last data filed at 08/14/2018 1450 Gross per 24 hour  Intake 2150.75 ml  Output 1625 ml  Net 525.75 ml   Physical Exam  Constitutional: He appears well-developed and well-nourished. No distress.  HENT:  Head: Normocephalic.  Eyes: EOM are normal.  Cardiovascular: Normal rate, regular rhythm, normal heart sounds and intact distal pulses.  Respiratory: Effort normal.  Musculoskeletal:        General: Edema present. No tenderness.     Comments: Mild bilateral LE edema  Neurological: He is alert.  Skin: Skin is warm. He is not diaphoretic.  Psychiatric: He has a normal mood and affect. His behavior is normal.   Assessment/Plan: Caleb [name] is a 68 yo male with history of HTN, COPD, CABGx3, CAD, RAS who presented with bilateral lower extremity weakness which developed over the last few days.   Hypotonic Hyponatermia The patient's sodium on admission was 120 without any hyperglycemia. Serum Osm 252, UOsm  140, and Una 43. After 3 hours, Sodium increased to 124. In the meantime, pt received 500 mL NS. Clinical presentation along with reported history of excess sweating with free water replacement and home HCTZ is suggestive of hypovolemic hyponatremia.  -Continue close monitoring of BMP q4h -Slow repletion with normal saline, with rate of increase <6 per day   Normocytic Anemia In the ED, pt's Hgb was 9.0. Per Chart Review, patient's Hgb ranged between 8.2-9.6. Low suspicion for acute GI blood loss. Possible etiology: nutritional deficiency vs.  underlying kidney disease (moderate left renal artery stenosis). Retic Count: 1.2.. Iron Panel results: Iron-51, TIBC 351, Ferritin 27, Saturation Ratio:15.  -Low ferritin concerning for iron deficiency anemia -Consider starting PO Iron supplement starting tomorrow after further improvement of hyponatremia Will need referral to GI at discharge for further evaluation of source of his iron deficiency   AKI Initial Cr in the ED was 1.64. After 3 hours and s/p IVF, Cr is down to 1.39. Patient's baseline Cr is between 0.93-1.15. Given history of dehydration, low fluid intake, and decreasing Cr, most likely prerenal AKI.  - Serum Cr is down to 1.27 as of noon on 7/30.  - Continue slow repletion of fluids with IVF - Closely monitor   HTN and RAS: -Continue home Amlodipine 10mg  -Continue home carvedilol 12.5mg    COPD: -Albuterol q6h prn   Peripheral Neuropathy: -Continue home gabapentin 300mg   CAD: -Atorvastatin  80mg    Diet: Heart healthy, Senna PRN for constipation DVT prophylaxis: Home Xarelto 20mg     CODE Status: Full code   Dispo: Continue hospitalization for close monitoring and correction of hyponatremia   LOS: 1 day   Caleb [name], Medical Student 08/14/2018, 5:10 PM Pager: [phone]

## 2018-08-15 DIAGNOSIS — D509 Iron deficiency anemia, unspecified: Secondary | ICD-10-CM

## 2018-08-15 LAB — BASIC METABOLIC PANEL
Anion gap: 7 (ref 5–15)
Anion gap: 11 (ref 5–15)
Anion gap: 12 (ref 5–15)
Anion gap: 10 (ref 5–15)
BUN: 14 mg/dL (ref 8–23)
BUN: 12 mg/dL (ref 8–23)
BUN: 9 mg/dL (ref 8–23)
BUN: 9 mg/dL (ref 8–23)
CO2: 25 mmol/L (ref 22–32)
CO2: 23 mmol/L (ref 22–32)
CO2: 22 mmol/L (ref 22–32)
CO2: 23 mmol/L (ref 22–32)
Calcium: 8.6 mg/dL — ABNORMAL LOW (ref 8.9–10.3)
Calcium: 8.6 mg/dL — ABNORMAL LOW (ref 8.9–10.3)
Calcium: 8.8 mg/dL — ABNORMAL LOW (ref 8.9–10.3)
Calcium: 8.6 mg/dL — ABNORMAL LOW (ref 8.9–10.3)
Chloride: 92 mmol/L — ABNORMAL LOW (ref 98–111)
Chloride: 92 mmol/L — ABNORMAL LOW (ref 98–111)
Chloride: 91 mmol/L — ABNORMAL LOW (ref 98–111)
Chloride: 91 mmol/L — ABNORMAL LOW (ref 98–111)
Creatinine, Ser: 1.26 mg/dL — ABNORMAL HIGH (ref 0.61–1.24)
Creatinine, Ser: 1.17 mg/dL (ref 0.61–1.24)
Creatinine, Ser: 0.94 mg/dL (ref 0.61–1.24)
Creatinine, Ser: 1.03 mg/dL (ref 0.61–1.24)
GFR calc Af Amer: >60 mL/min (ref >60)
GFR calc Af Amer: >60 mL/min (ref >60)
GFR calc Af Amer: >60 mL/min (ref >60)
GFR calc Af Amer: >60 mL/min (ref >60)
GFR calc non Af Amer: 58 mL/min — ABNORMAL LOW (ref >60)
GFR calc non Af Amer: >60 mL/min (ref >60)
GFR calc non Af Amer: >60 mL/min (ref >60)
GFR calc non Af Amer: >60 mL/min (ref >60)
Glucose, Bld: 114 mg/dL — ABNORMAL HIGH (ref 70–99)
Glucose, Bld: 110 mg/dL — ABNORMAL HIGH (ref 70–99)
Glucose, Bld: 123 mg/dL — ABNORMAL HIGH (ref 70–99)
Glucose, Bld: 153 mg/dL — ABNORMAL HIGH (ref 70–99)
Potassium: 3.7 mmol/L (ref 3.5–5.1)
Potassium: 3.6 mmol/L (ref 3.5–5.1)
Potassium: 3.5 mmol/L (ref 3.5–5.1)
Potassium: 3.2 mmol/L — ABNORMAL LOW (ref 3.5–5.1)
Sodium: 124 mmol/L — ABNORMAL LOW (ref 135–145)
Sodium: 126 mmol/L — ABNORMAL LOW (ref 135–145)
Sodium: 125 mmol/L — ABNORMAL LOW (ref 135–145)
Sodium: 124 mmol/L — ABNORMAL LOW (ref 135–145)

## 2018-08-15 MED ORDER — SODIUM CHLORIDE 1 G PO TABS
1.0000 g | ORAL_TABLET | Freq: Once | ORAL | Status: AC
  Administered 2018-08-16: 1 g via ORAL
  Filled 2018-08-15: qty 1

## 2018-08-15 MED ORDER — SODIUM CHLORIDE 0.9 % IV SOLN
INTRAVENOUS | Status: DC
  Administered 2018-08-15: 20:00:00 via INTRAVENOUS

## 2018-08-15 MED ORDER — POTASSIUM CHLORIDE CRYS ER 20 MEQ PO TBCR
40.0000 meq | EXTENDED_RELEASE_TABLET | Freq: Once | ORAL | Status: AC
  Administered 2018-08-16: 40 meq via ORAL
  Filled 2018-08-15: qty 2

## 2018-08-15 MED ORDER — SODIUM CHLORIDE 0.9 % IV SOLN
INTRAVENOUS | Status: AC
  Administered 2018-08-15: 06:00:00 via INTRAVENOUS

## 2018-08-15 NOTE — Progress Notes (Signed)
  Date: 08/15/2018  Patient name: Caleb [name]  Medical record number: [id number]  Date of birth: Jan 03, 1951   I have seen and evaluated this patient and I have discussed the plan of care with the house staff. Please see their note for complete details. I concur with their findings with the following additions/corrections:   67 year old man admitted with hypovolemic hyponatremia like related to HCTZ use and working in non-air-conditioned setting.  Initial sodium 120, correcting slowly with IV normal saline, most recent sodium 126, aiming for correction of 6 every 24 hours, goal sodium 132 by tomorrow morning.  We have told him he will need to stop taking HCTZ and may need to start a new blood pressure medicine in its place.  He also has iron deficiency anemia of unclear etiology.  He should be evaluated with a colonoscopy after discharge.  Lenice Pressman, M.D., Ph.D. 08/15/2018, 4:51 PM

## 2018-08-15 NOTE — Progress Notes (Signed)
     Subjective: Patient reports feeling well and ready to go. We discussed we would like to see his sodium correct to a safer level before discharge. Patient agreeable. Reports having bowel movement yesterday. Appetite is good.  Patient is followed by Dr.Wilson and recommended to schedule appointment for a follow up on hyponatremia and on HCTZ. In addition we recommend he have a colonoscopy scheduled due to his history of anemia and age. He has never had a colonoscopy.   Objective:  Vital signs in last 24 hours: Vitals:   08/14/18 1743 08/14/18 2130 08/15/18 0547 08/15/18 0937  BP: (!) 126/92 (!) 132/56 (!) 139/46 (!) 132/55  Pulse: (!) 55 (!) 59 68 65  Resp: 18   18  Temp:  98.5 F (36.9 C) 99 F (37.2 C) 98.9 F (37.2 C)  TempSrc:  Oral Oral Oral  SpO2: 98% 98% 100% 100%  Weight:      Height:       Cardiac: JVD flat, normal rate and rhythm, clear s1 and s2, no murmurs, rubs or gallops, no LE edema Pulmonary: CTAB, not in distress Abdominal: non distended abdomen, soft and nontender Psych: Alert, conversant, in good spirits   Assessment/Plan:  Principal Problem:   Hyponatremia Active Problems:   Acute kidney injury (HCC)   Iron deficiency anemia  Hypovolemic hypotonic hyponatremia: Secondary to dehydration and taking thiazide diuretic initially causing some generalized weakness  -Treating with slow IV fluid resuscitation to avoid overcorrection -Continue IV fluids we will check another sodium level tonight and once in the morning  AKI: Improving with IV fluid hydration  Hypertension: Holding losartan and HCTZ combination, continuing amlodipine and carvedilol  COPD: -Albuterol q6h prn  Peripheral Neuropathy: -Continue home gabapentin 300mg   CAD: -Atorvastatin 80mg   Dispo: Anticipated discharge in approximately 1 day(s).   Katherine Roan, MD 08/15/2018, 3:16 PM Vickki Muff MD PGY-3 Internal Medicine Pager # [phone]

## 2018-08-15 NOTE — Plan of Care (Signed)
  Problem: Education: Goal: Knowledge of General Education information will improve Description: Including pain rating scale, medication(s)/side effects and non-pharmacologic comfort measures Outcome: Completed/Met   Problem: Health Behavior/Discharge Planning: Goal: Ability to manage health-related needs will improve Outcome: Completed/Met   Problem: Activity: Goal: Risk for activity intolerance will decrease Outcome: Completed/Met   Problem: Nutrition: Goal: Adequate nutrition will be maintained Outcome: Completed/Met   Problem: Coping: Goal: Level of anxiety will decrease Outcome: Completed/Met   Problem: Elimination: Goal: Will not experience complications related to bowel motility Outcome: Completed/Met Goal: Will not experience complications related to urinary retention Outcome: Completed/Met   Problem: Pain Managment: Goal: General experience of comfort will improve Outcome: Completed/Met   Problem: Skin Integrity: Goal: Risk for impaired skin integrity will decrease Outcome: Completed/Met

## 2018-08-16 DIAGNOSIS — Z881 Allergy status to other antibiotic agents status: Secondary | ICD-10-CM

## 2018-08-16 DIAGNOSIS — Z888 Allergy status to other drugs, medicaments and biological substances status: Secondary | ICD-10-CM

## 2018-08-16 LAB — BASIC METABOLIC PANEL
Anion gap: 9 (ref 5–15)
Anion gap: 9 (ref 5–15)
BUN: 9 mg/dL (ref 8–23)
BUN: 7 mg/dL — ABNORMAL LOW (ref 8–23)
CO2: 23 mmol/L (ref 22–32)
CO2: 22 mmol/L (ref 22–32)
Calcium: 8.8 mg/dL — ABNORMAL LOW (ref 8.9–10.3)
Calcium: 8.6 mg/dL — ABNORMAL LOW (ref 8.9–10.3)
Chloride: 96 mmol/L — ABNORMAL LOW (ref 98–111)
Chloride: 98 mmol/L (ref 98–111)
Creatinine, Ser: 0.95 mg/dL (ref 0.61–1.24)
Creatinine, Ser: 0.88 mg/dL (ref 0.61–1.24)
GFR calc Af Amer: >60 mL/min (ref >60)
GFR calc Af Amer: >60 mL/min (ref >60)
GFR calc non Af Amer: >60 mL/min (ref >60)
GFR calc non Af Amer: >60 mL/min (ref >60)
Glucose, Bld: 108 mg/dL — ABNORMAL HIGH (ref 70–99)
Glucose, Bld: 106 mg/dL — ABNORMAL HIGH (ref 70–99)
Potassium: 4.2 mmol/L (ref 3.5–5.1)
Potassium: 3.9 mmol/L (ref 3.5–5.1)
Sodium: 128 mmol/L — ABNORMAL LOW (ref 135–145)
Sodium: 129 mmol/L — ABNORMAL LOW (ref 135–145)

## 2018-08-16 NOTE — Progress Notes (Signed)
Internal Medicine Attending:   I saw and examined the patient. I reviewed the resident's note and I agree with the resident's findings and plan as documented in the resident's note.  Patient states that he feels well today and has no new complaints.  Patient was initially made to the hospital with hypovolemic hyponatremia and was found to have a sodium of 120.  Patient sodium is slowly improved over the last 48 hours to 129.  Patient has no evidence of weakness and is able to ambulate without any difficulty.  Patient stable for DC home today.  He will need outpatient follow-up with his PCP with repeat BMP next week.  Continue to hold HCTZ.

## 2018-08-16 NOTE — Plan of Care (Signed)
  Problem: Clinical Measurements: Goal: Diagnostic test results will improve Outcome: Progressing   Problem: Clinical Measurements: Goal: Respiratory complications will improve Outcome: Progressing   

## 2018-08-16 NOTE — Plan of Care (Signed)
  Problem: Clinical Measurements: Goal: Ability to maintain clinical measurements within normal limits will improve Outcome: Adequate for Discharge   Problem: Clinical Measurements: Goal: Will remain free from infection Outcome: Adequate for Discharge   Problem: Clinical Measurements: Goal: Diagnostic test results will improve 08/16/2018 1342 by Dolores Hoose, RN Outcome: Adequate for Discharge 08/16/2018 0739 by Dolores Hoose, RN Outcome: Progressing   Problem: Clinical Measurements: Goal: Respiratory complications will improve 08/16/2018 1342 by Dolores Hoose, RN Outcome: Adequate for Discharge 08/16/2018 0739 by Dolores Hoose, RN Outcome: Progressing   Problem: Clinical Measurements: Goal: Cardiovascular complication will be avoided Outcome: Adequate for Discharge

## 2018-08-16 NOTE — Progress Notes (Signed)
     Subjective: Patient reports feeling well and ready to go. He was happy to hear his sodium improved.  No cp or SOB, feeling well no more weakness.    Objective:  Vital signs in last 24 hours: Vitals:   08/15/18 1700 08/15/18 1943 08/16/18 0525 08/16/18 0843  BP: (!) 134/53 (!) 143/52 (!) 135/50 (!) 131/49  Pulse: 62 (!) 57 61 60  Resp: 18 16 16 18   Temp: 98.4 F (36.9 C) 99.1 F (37.3 C) 99.3 F (37.4 C) 98.5 F (36.9 C)  TempSrc: Oral Oral Oral Oral  SpO2: 97% 98% 98% 98%  Weight:   66.8 kg   Height:       Cardiac: JVD flat, normal rate and rhythm, clear s1 and s2, no murmurs, rubs or gallops, no LE edema Pulmonary: CTAB, not in distress Abdominal: non distended abdomen, soft and nontender Psych: Alert, conversant, in good spirits   Assessment/Plan:  Principal Problem:   Hyponatremia Active Problems:   Acute kidney injury (HCC)   Iron deficiency anemia  Hypovolemic hypotonic hyponatremia: Secondary to dehydration and taking thiazide diuretic initially causing some generalized weakness.  Depleted osms and dehydration   -Improved 129 this am will discharge pt with close follow up  AKI: Resolved  Hypertension: Holding losartan and HCTZ combination, continuing amlodipine and carvedilol  COPD: -Albuterol q6h prn  Peripheral Neuropathy: -Continue home gabapentin 300mg   CAD: -Atorvastatin 80mg   Dispo: Anticipated discharge home today  Katherine Roan, MD 08/16/2018, 1:01 PM Vickki Muff MD PGY-3 Internal Medicine Pager # [phone]

## 2018-08-16 NOTE — Progress Notes (Signed)
DISCHARGE NOTE  [name] to be discharged Home per MD order. Patient verbalized understanding.  Skin clean, dry and intact without evidence of skin break down, no evidence of skin tears noted. IV catheter discontinued intact. Site without signs and symptoms of complications. Dressing and pressure applied. Pt denies pain at the site currently. No complaints noted.  Patient free of lines, drains, and wounds.   Discharge packet assembled. An After Visit Summary (AVS) was printed and given to the EMS personnel. Patient escorted via wheelchair and discharged to designated family member  via private vehicle. All questions and concerns addressed.   Dolores Hoose, RN

## 2018-08-17 NOTE — Discharge Summary (Signed)
Name: Caleb [name] MRN: [id number] DOB: [birth year] 68 y.o. PCP: Christain Sacramento, MD  Date of Admission: 08/13/2018 12:43 PM Date of Discharge: 08/16/2018 Attending Physician: Dr. Aldine Contes  Discharge Diagnosis: 1. Hyponatremia 2. Iron Deficiency Anemia of Unclear Etiology  Discharge Medications: Allergies as of 08/16/2018      Reactions   Sulfamethoxazole-trimethoprim Other (See Comments)   "Made potassium go high and sodium go low"   Cilostazol Swelling   Site of swelling not recalled by the patient   Lisinopril Cough      Medication List    STOP taking these medications   losartan-hydrochlorothiazide 50-12.5 MG tablet Commonly known as: Hyzaar     TAKE these medications   acetaminophen 500 MG tablet Commonly known as: TYLENOL Take 500 mg by mouth 2 (two) times a day.   albuterol 108 (90 Base) MCG/ACT inhaler Commonly known as: VENTOLIN HFA Inhale 2 puffs into the lungs every 6 (six) hours as needed for wheezing or shortness of breath.   amLODipine 10 MG tablet Commonly known as: NORVASC Take 1 tablet (10 mg total) by mouth daily.   atorvastatin 80 MG tablet Commonly known as: LIPITOR Take 1 tablet (80 mg total) by mouth daily.   carvedilol 12.5 MG tablet Commonly known as: COREG Take 1 tablet (12.5 mg total) by mouth 2 (two) times daily.   gabapentin 300 MG capsule Commonly known as: NEURONTIN Take 300 mg by mouth 3 (three) times daily.   vitamin B-12 1000 MCG tablet Commonly known as: CYANOCOBALAMIN Take 1,000 mcg by mouth daily with breakfast.   VITAMIN D-3 PO Take 1 capsule by mouth daily with breakfast.   Xarelto 20 MG Tabs tablet Generic drug: rivaroxaban Take 20 mg by mouth daily before breakfast.       Disposition and follow-up:   Mr.Caleb [name] was discharged from [facility] in Stable condition.  At the hospital follow up visit please address:  1.    Hyponatremia:  -ensure pt has d/ced his losartan HCTZ combo  pill and is staying well hydrated   Normocytic anemia: -Needs a colonoscopy due to normocytic iron deficiency anemia of unclear etiology -daughter started him on OTC iron supplementation just before admission, will need to follow up with PCP to check dosage and see if adequate -encourage alcohol cessation as this could be suppressing red cell production     2.  Labs / imaging needed at time of follow-up: BMP  3.  Pending labs/ test needing follow-up: None  Follow-up Appointments: Follow-up Information    Christain Sacramento, MD Follow up in 4 day(s).   Specialty: Family Medicine Why: Please follow up with your pcp within the weak to repeat your labwork and request a colonoscopy order.   Contact information: [address] [place] [address] [phone]        Josue Hector, MD .   Specialty: Cardiology Contact information: [phone] [address] [place] [address] [place] by problem list: 1. Hyponatremia In the ED, Sodium was 120, Serum Osm 252, UOsm 140, and Una 43 which suggests hypovolemic hyponatremia likely related to HCTZ use and working in non-air-conditioned setting. In the ED, patient received a bolus of 500 ml normal saline. Over the next 48 hours, we cautiously increased patient's sodium with normal saline at 50 ml-150 ml/hr rates. At time of discharge, patient's sodium was 129. His weakness resolved and  he was able to ambulate without any difficulty.   2. Acute Kidney Injury Initial Cr in the ED was 1.64. With slow repletion with normal saline, patient's Cr progressively improved. On day of discharge, AKI resolved and Cr normalized to 0.88.   3. Hypertension We DISCONTINUED patient's home Losartan-HCTZ but continued patient's other home BP meds: Amlodipine 10mg  and Carvedilol 12.5mg . His BP remained in 130s/50s.   4. CAD, COPD, Peripheral Neuropathy: Continued patient's home Atorvastatin 80mg , gabapentin 300mg ,  and albuterol PRN. Patient remained asymptomatic.   Discharge Vitals:   BP (!) 131/49 (BP Location: Right Arm)   Pulse 60   Temp 98.5 F (36.9 C) (Oral)   Resp 18   Ht 5\' 4"  (1.626 m)   Wt 66.8 kg   SpO2 98%   BMI 25.28 kg/m   Pertinent Labs, Studies, and Procedures:  Sodium: -Day 1: 120 -Day 3 (day of discharge): 129  Creatinine: -Day 1: 1.64 -Day 3: 0.88  Discharge Instructions: Discharge Instructions    Diet - low sodium heart healthy   Complete by: As directed    Discharge instructions   Complete by: As directed    Mr. Caleb [name] please follow up with your pcp as we discussed.  We have discontinued your losartan/HCTZ combination blood pressure pill this can be found in your discharge paperwork.  Please do not take any more of this.  Please stay well hydrated consider drinking fluids with electrolytes when you are working outside such as no calorie or low calorie gatorade.  Please make sure to eat plenty of fruits and vegetables and feel free to add some extra salt to your diet over the next week.   Increase activity slowly   Complete by: As directed

## 2018-09-09 ENCOUNTER — Other Ambulatory Visit: Payer: Self-pay | Admitting: Pulmonary Disease

## 2018-09-13 ENCOUNTER — Other Ambulatory Visit (HOSPITAL_COMMUNITY)
Admission: RE | Admit: 2018-09-13 | Discharge: 2018-09-13 | Disposition: A | Discharge status: Home / Self Care | Payer: Medicare HMO | Source: Ambulatory Visit | Attending: Pulmonary Disease | Admitting: Pulmonary Disease

## 2018-09-13 DIAGNOSIS — Z01812 Encounter for preprocedural laboratory examination: Secondary | ICD-10-CM | POA: Diagnosis present

## 2018-09-13 DIAGNOSIS — Z20828 Contact with and (suspected) exposure to other viral communicable diseases: Secondary | ICD-10-CM | POA: Diagnosis not present

## 2018-09-13 LAB — SARS CORONAVIRUS 2 (TAT 6-24 HRS): SARS Coronavirus 2: NEGATIVE

## 2018-09-17 ENCOUNTER — Ambulatory Visit (INDEPENDENT_AMBULATORY_CARE_PROVIDER_SITE_OTHER): Payer: Medicare HMO | Admitting: Pulmonary Disease

## 2018-09-17 ENCOUNTER — Ambulatory Visit: Payer: Medicare HMO | Admitting: Pulmonary Disease

## 2018-09-17 ENCOUNTER — Encounter: Payer: Self-pay | Admitting: Pulmonary Disease

## 2018-09-17 ENCOUNTER — Other Ambulatory Visit: Payer: Self-pay

## 2018-09-17 VITALS — BP 138/80 | HR 66 | Ht 65.0 in | Wt 155.0 lb

## 2018-09-17 DIAGNOSIS — J410 Simple chronic bronchitis: Secondary | ICD-10-CM | POA: Diagnosis not present

## 2018-09-17 DIAGNOSIS — R0600 Dyspnea, unspecified: Secondary | ICD-10-CM

## 2018-09-17 LAB — PULMONARY FUNCTION TEST
DL/VA % pred: 89 %
DL/VA: 3.7 ml/min/mmHg/L
DLCO unc % pred: 78 %
DLCO unc: 17.42 ml/min/mmHg
FEF 25-75 Post: 1.29 L/sec
FEF 25-75 Pre: 1.02 L/sec
FEF2575-%Change-Post: 26 %
FEF2575-%Pred-Post: 61 %
FEF2575-%Pred-Pre: 49 %
FEV1-%Change-Post: 5 %
FEV1-%Pred-Post: 70 %
FEV1-%Pred-Pre: 66 %
FEV1-Post: 1.9 L
FEV1-Pre: 1.79 L
FEV1FVC-%Change-Post: 3 %
FEV1FVC-%Pred-Pre: 93 %
FEV6-%Change-Post: 3 %
FEV6-%Pred-Post: 77 %
FEV6-%Pred-Pre: 74 %
FEV6-Post: 2.64 L
FEV6-Pre: 2.55 L
FEV6FVC-%Change-Post: 1 %
FEV6FVC-%Pred-Post: 105 %
FEV6FVC-%Pred-Pre: 104 %
FVC-%Change-Post: 2 %
FVC-%Pred-Post: 72 %
FVC-%Pred-Pre: 71 %
FVC-Post: 2.66 L
FVC-Pre: 2.6 L
Post FEV1/FVC ratio: 71 %
Post FEV6/FVC ratio: 99 %
Pre FEV1/FVC ratio: 69 %
Pre FEV6/FVC Ratio: 98 %
RV % pred: 108 %
RV: 2.35 L
TLC % pred: 81 %
TLC: 4.91 L

## 2018-09-17 MED ORDER — BREO ELLIPTA 200-25 MCG/INH IN AEPB: 1.0000 | INHALATION_SPRAY | Freq: Every day | RESPIRATORY_TRACT | 0 refills | Status: DC

## 2018-09-17 NOTE — Addendum Note (Signed)
Addended by: Hildred Alamin I on: 09/17/2018 04:16 PM   Modules accepted: Orders

## 2018-09-17 NOTE — Progress Notes (Signed)
PFT done today. 

## 2018-09-17 NOTE — Patient Instructions (Signed)
COPD  We will give you a sample of Breo Use Breo once a day-regardless of how you are feeling(ensure you always rinse your mouth following using the inhaler )  rescue inhaler use as needed-if you are short of breath  Give [place] a call after trying the [facility] for a few days, if it is helping then we will send in a prescription  I will see you back in the office in about 3 months  Call us with any concerns

## 2018-09-17 NOTE — Progress Notes (Signed)
Subjective:     Patient ID: Caleb [name], male   DOB: [birth year], 68 y.o.   MRN: [id number]  Follow-up of shortness of breath  Feels a little bit better  Uses albuterol as needed-did not feel it helped much Has a lot of limitation regarding pain and discomfort in his lower extremity  Has reduced his smoking to about 6 cigarettes a day Still working on quitting  He has an occasional cough, minimal phlegm production Has not felt acutely ill  No ED visits  Shortness of breath with moderate exertion  He is an active smoker Was able to quit for about a year in the past  Denies any fevers or chills  He works as a Furniture conservator/restorer and is exposed to a lot of occupational triggers   Past Medical History:  Diagnosis Date  . Acute respiratory failure with hypoxia ([place])   . Acute systolic congestive heart failure ([place]) 02/11/2014  . COPD (chronic obstructive pulmonary disease) ([place]) 02/11/2014  . Essential hypertension   . Hypercholesteremia   . 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 Tobacco abuse    Social History   Socioeconomic History  . Marital status: Single    Spouse name: Not on file  . Number of children: Not on file  . Years of education: Not on file  . Highest education level: Not on file  Occupational History  . Not on file  Social Needs  . Financial resource strain: Not on file  . Food insecurity    Worry: Not on file    Inability: Not on file  . Transportation needs    Medical: Not on file    Non-medical: Not on file  Tobacco Use  . Smoking status: Current Every Day Smoker    Packs/day: 0.50    Years: 35.00    Pack years: 17.50    Types: Cigarettes    Last attempt to quit: 02/11/2014    Years since quitting: 4.6  . Smokeless tobacco: Never Used  . Tobacco comment: has quit twice before   Substance and Sexual Activity  . Alcohol use: Yes    Alcohol/week: 0.0 standard drinks    Comment: Six pack a day of beer   . Drug use: Not on file  . Sexual activity: Not on file  Lifestyle  . Physical activity    Days per week: Not on file    Minutes per session: Not on file  . Stress: Not on file  Relationships  . Social Herbalist on phone: Not on file    Gets together: Not on file    Attends religious service: Not on file    Active member of club or organization: Not on file    Attends meetings of clubs or organizations: Not on file    Relationship status: Not on file  . Intimate partner violence    Fear of current or ex partner: Not on file    Emotionally abused: Not on file    Physically abused: Not on file    Forced sexual activity: Not on file  Other Topics Concern  . Not on file  Social History Narrative  . Not on file   Family History  Problem Relation Age of Onset  . Heart attack Mother   . Diabetes Mother   . Heart attack Brother   . Diabetes Brother   . Cancer Sister   .  Stroke Neg Hx      Review of Systems  Constitutional: Positive for fatigue.  HENT: Negative for rhinorrhea.   Eyes: Negative.   Respiratory: Positive for cough, shortness of breath and wheezing.   Cardiovascular: Positive for leg swelling.  Gastrointestinal: Negative.   Endocrine: Negative.   Genitourinary: Negative.   Musculoskeletal: Negative.   Skin: Negative.   Neurological: Negative.   Psychiatric/Behavioral: Negative.        Objective:   Physical Exam Constitutional:      Appearance: Normal appearance.  HENT:     Head: Normocephalic and atraumatic.  Eyes:     General:        Right eye: No discharge.        Left eye: No discharge.     Pupils: Pupils are equal, round, and reactive to light.  Neck:     Musculoskeletal: Normal range of motion and neck supple. No neck rigidity or muscular tenderness.  Cardiovascular:     Rate and Rhythm: Normal rate and regular rhythm.     Heart sounds: No murmur.  Pulmonary:     Effort: Pulmonary effort is normal.     Breath sounds: Normal  breath sounds. No stridor.  Abdominal:     General: There is no distension.     Palpations: There is no mass.     Tenderness: There is no abdominal tenderness.  Neurological:     Mental Status: He is alert.    Vitals:   09/17/18 1556  BP: 138/80  Pulse: 66  SpO2: 99%   Echocardiogram reviewed with the patient  Pulmonary function test reviewed with patient revealing mild obstructive airway disease    Assessment:     Dyspnea on exertion -Dyspnea on moderate exertion  Mild obstructive airway disease on PFT Emphysema on recent CT scan of the chest  History of congestive heart failure -On medications  History of coronary artery disease  Tobacco abuse -Continues to work on quitting    Plan:     We will give him a trial with Breo 200, 1 puff daily  Is to give [place] a call if he feels he is benefiting from using Breo  Encouraged about the need to continue to work on quitting smoking  Albuterol to be used as needed  Importance of regular exercises discussed with the patient  The risks with continuing to smoke was discussed

## 2018-10-23 ENCOUNTER — Other Ambulatory Visit: Payer: Self-pay | Admitting: Gastroenterology

## 2018-10-24 ENCOUNTER — Ambulatory Visit (HOSPITAL_COMMUNITY)
Admission: RE | Admit: 2018-10-24 | Discharge: 2018-10-24 | Disposition: A | Discharge status: Home / Self Care | Payer: Medicare HMO | Source: Ambulatory Visit | Attending: Cardiology | Admitting: Cardiology

## 2018-10-24 ENCOUNTER — Other Ambulatory Visit: Payer: Self-pay

## 2018-10-24 ENCOUNTER — Other Ambulatory Visit (HOSPITAL_COMMUNITY): Payer: Self-pay | Admitting: Cardiovascular Disease

## 2018-10-24 DIAGNOSIS — I701 Atherosclerosis of renal artery: Secondary | ICD-10-CM | POA: Diagnosis present

## 2018-10-28 ENCOUNTER — Other Ambulatory Visit (HOSPITAL_COMMUNITY)
Admission: RE | Admit: 2018-10-28 | Discharge: 2018-10-28 | Disposition: A | Discharge status: Home / Self Care | Payer: Medicare HMO | Source: Ambulatory Visit | Attending: Gastroenterology | Admitting: Gastroenterology

## 2018-10-28 DIAGNOSIS — Z20828 Contact with and (suspected) exposure to other viral communicable diseases: Secondary | ICD-10-CM | POA: Diagnosis not present

## 2018-10-28 DIAGNOSIS — Z01812 Encounter for preprocedural laboratory examination: Secondary | ICD-10-CM | POA: Diagnosis present

## 2018-10-29 LAB — NOVEL CORONAVIRUS, NAA (HOSP ORDER, SEND-OUT TO REF LAB; TAT 18-24 HRS): SARS-CoV-2, NAA: NOT DETECTED

## 2018-10-30 ENCOUNTER — Other Ambulatory Visit: Payer: Self-pay

## 2018-10-30 ENCOUNTER — Encounter (HOSPITAL_COMMUNITY): Payer: Self-pay | Admitting: Emergency Medicine

## 2018-10-30 NOTE — Progress Notes (Signed)
Pre-op endo call completed.  Patient daughter Levada Dy provided hx. Patient has been off xarelto for the last 2 weeks per Levada Dy .

## 2018-10-30 NOTE — Progress Notes (Signed)
Attempted to call patient about quarantine since Covid. Unable to leave message.

## 2018-10-30 NOTE — Progress Notes (Signed)
Pre-op endo call attempted. No answer. lmtcb 

## 2018-10-31 ENCOUNTER — Encounter (HOSPITAL_COMMUNITY): Payer: Self-pay | Admitting: *Deleted

## 2018-10-31 ENCOUNTER — Ambulatory Visit (HOSPITAL_COMMUNITY): Payer: Medicare HMO | Admitting: Anesthesiology

## 2018-10-31 ENCOUNTER — Encounter (HOSPITAL_COMMUNITY)
Admission: RE | Disposition: A | Discharge status: Home / Self Care | Payer: Self-pay | Source: Home / Self Care | Attending: Gastroenterology

## 2018-10-31 ENCOUNTER — Ambulatory Visit (HOSPITAL_COMMUNITY)
Admission: RE | Admit: 2018-10-31 | Discharge: 2018-10-31 | Disposition: A | Discharge status: Home / Self Care | Payer: Medicare HMO | Attending: Gastroenterology | Admitting: Gastroenterology

## 2018-10-31 DIAGNOSIS — D120 Benign neoplasm of cecum: Secondary | ICD-10-CM | POA: Diagnosis not present

## 2018-10-31 DIAGNOSIS — I739 Peripheral vascular disease, unspecified: Secondary | ICD-10-CM | POA: Diagnosis not present

## 2018-10-31 DIAGNOSIS — D132 Benign neoplasm of duodenum: Secondary | ICD-10-CM | POA: Insufficient documentation

## 2018-10-31 DIAGNOSIS — D509 Iron deficiency anemia, unspecified: Secondary | ICD-10-CM | POA: Insufficient documentation

## 2018-10-31 DIAGNOSIS — E7800 Pure hypercholesterolemia, unspecified: Secondary | ICD-10-CM | POA: Diagnosis not present

## 2018-10-31 DIAGNOSIS — I509 Heart failure, unspecified: Secondary | ICD-10-CM | POA: Diagnosis not present

## 2018-10-31 DIAGNOSIS — Z7951 Long term (current) use of inhaled steroids: Secondary | ICD-10-CM | POA: Diagnosis not present

## 2018-10-31 DIAGNOSIS — E785 Hyperlipidemia, unspecified: Secondary | ICD-10-CM | POA: Insufficient documentation

## 2018-10-31 DIAGNOSIS — Z7901 Long term (current) use of anticoagulants: Secondary | ICD-10-CM | POA: Diagnosis not present

## 2018-10-31 DIAGNOSIS — J449 Chronic obstructive pulmonary disease, unspecified: Secondary | ICD-10-CM | POA: Insufficient documentation

## 2018-10-31 DIAGNOSIS — I110 Hypertensive heart disease with heart failure: Secondary | ICD-10-CM | POA: Insufficient documentation

## 2018-10-31 DIAGNOSIS — Z79899 Other long term (current) drug therapy: Secondary | ICD-10-CM | POA: Insufficient documentation

## 2018-10-31 DIAGNOSIS — Z951 Presence of aortocoronary bypass graft: Secondary | ICD-10-CM | POA: Diagnosis not present

## 2018-10-31 DIAGNOSIS — K2951 Unspecified chronic gastritis with bleeding: Secondary | ICD-10-CM | POA: Diagnosis not present

## 2018-10-31 DIAGNOSIS — I2510 Atherosclerotic heart disease of native coronary artery without angina pectoris: Secondary | ICD-10-CM | POA: Diagnosis not present

## 2018-10-31 DIAGNOSIS — D122 Benign neoplasm of ascending colon: Secondary | ICD-10-CM | POA: Diagnosis not present

## 2018-10-31 DIAGNOSIS — F17210 Nicotine dependence, cigarettes, uncomplicated: Secondary | ICD-10-CM | POA: Insufficient documentation

## 2018-10-31 HISTORY — PX: POLYPECTOMY: SHX5525

## 2018-10-31 HISTORY — PX: BIOPSY: SHX5522

## 2018-10-31 HISTORY — PX: HEMOSTASIS CLIP PLACEMENT: SHX6857

## 2018-10-31 HISTORY — PX: SUBMUCOSAL LIFTING INJECTION: SHX6855

## 2018-10-31 HISTORY — PX: COLONOSCOPY WITH PROPOFOL: SHX5780

## 2018-10-31 HISTORY — PX: ESOPHAGOGASTRODUODENOSCOPY (EGD) WITH PROPOFOL: SHX5813

## 2018-10-31 LAB — GLUCOSE, CAPILLARY: Glucose-Capillary: 116 mg/dL — ABNORMAL HIGH (ref 70–99)

## 2018-10-31 SURGERY — ESOPHAGOGASTRODUODENOSCOPY (EGD) WITH PROPOFOL
Anesthesia: Monitor Anesthesia Care

## 2018-10-31 MED ORDER — PROPOFOL 500 MG/50ML IV EMUL
INTRAVENOUS | Status: AC
  Filled 2018-10-31: qty 50

## 2018-10-31 MED ORDER — PROPOFOL 10 MG/ML IV BOLUS
INTRAVENOUS | Status: AC
  Filled 2018-10-31: qty 20

## 2018-10-31 MED ORDER — PROPOFOL 10 MG/ML IV BOLUS
INTRAVENOUS | Status: AC
  Filled 2018-10-31: qty 40

## 2018-10-31 MED ORDER — LACTATED RINGERS IV SOLN
INTRAVENOUS | Status: DC
  Administered 2018-10-31: 11:00:00 via INTRAVENOUS
  Administered 2018-10-31: 1000 mL via INTRAVENOUS

## 2018-10-31 MED ORDER — SODIUM CHLORIDE 0.9 % IV SOLN: INTRAVENOUS | Status: DC

## 2018-10-31 MED ORDER — PROPOFOL 500 MG/50ML IV EMUL
INTRAVENOUS | Status: DC | PRN
  Administered 2018-10-31: 150 ug/kg/min via INTRAVENOUS

## 2018-10-31 MED ORDER — PROPOFOL 500 MG/50ML IV EMUL
INTRAVENOUS | Status: AC
  Filled 2018-10-31: qty 200

## 2018-10-31 MED ORDER — PROPOFOL 500 MG/50ML IV EMUL
INTRAVENOUS | Status: DC | PRN
  Administered 2018-10-31: 30 mg via INTRAVENOUS

## 2018-10-31 SURGICAL SUPPLY — 25 items

## 2018-10-31 NOTE — H&P (Signed)
  Caleb [name] HPI: The patient is here today for further evaluation of his IDA.  Past Medical History:  Diagnosis Date  . Acute respiratory failure with hypoxia ([place])   . Acute systolic congestive heart failure ([place]) 02/11/2014  . COPD (chronic obstructive pulmonary disease) ([place]) 02/11/2014  . Essential hypertension   . Hypercholesteremia   . 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 Tobacco abuse     Past Surgical History:  Procedure Laterality Date  . cataract surgery  Bilateral 12-2017 and 01-2018  . CORONARY ARTERY BYPASS GRAFT N/A 02/17/2014   Procedure: CORONARY ARTERY BYPASS GRAFTING (CABG);  Surgeon: Rexene Alberts, MD;  Location: [place];  Service: Open Heart Surgery;  Laterality: N/A;  Times 3 using bilateral mammary arteries and endoscopically harvested right saphenous vein  . FEMORAL ARTERY - FEMORAL ARTERY BYPASS GRAFT      right femoral artery to below-knee popliteal artery bypass with PTFE and right first ray amputation 04/25/17  . INTRAOPERATIVE TRANSESOPHAGEAL ECHOCARDIOGRAM N/A 02/17/2014   Procedure: INTRAOPERATIVE TRANSESOPHAGEAL ECHOCARDIOGRAM;  Surgeon: Rexene Alberts, MD;  Location: [place];  Service: Open Heart Surgery;  Laterality: N/A;  . LEFT HEART CATHETERIZATION WITH CORONARY ANGIOGRAM N/A 02/12/2014   Procedure: LEFT HEART CATHETERIZATION WITH CORONARY ANGIOGRAM;  Surgeon: Sinclair Grooms, MD;  Location: [facility] CATH LAB;  Service: Cardiovascular;  Laterality: N/A;  . TOE AMPUTATION Right    right great toe     Family History  Problem Relation Age of Onset  . Heart attack Mother   . Diabetes Mother   . Heart attack Brother   . Diabetes Brother   . Cancer Sister   . Stroke Neg Hx     Social History:  reports that he has been smoking cigarettes. He has a 17.50 pack-year smoking history. He has never used smokeless tobacco. He reports current alcohol use. No history on file for drug.  Allergies:  Allergies  Allergen  Reactions  . Sulfamethoxazole-Trimethoprim Other (See Comments)    "Made potassium go high and sodium go low"  . Cilostazol Swelling    Site of swelling not recalled by the patient  . Lisinopril Cough    Medications:  Scheduled:  Continuous: . sodium chloride    . lactated ringers 1,000 mL (10/31/18 0902)    No results found for this or any previous visit (from the past 24 hour(s)).   No results found.  ROS:  As stated above in the HPI otherwise negative.  Pulse 64, temperature 98 F (36.7 C), temperature source Oral, resp. rate 20, height 5\' 5"  (1.651 m), weight 68.9 kg, SpO2 100 %.    PE: Gen: NAD, Alert and Oriented HEENT:  [place]/AT, EOMI Neck: Supple, no LAD Lungs: CTA Bilaterally CV: RRR without M/G/R ABM: Soft, NTND, +BS Ext: No C/C/E  Assessment/Plan: 1) IDA - Enteroscopy and colonoscopy.  Marcella Charlson D 10/31/2018, 9:25 AM

## 2018-10-31 NOTE — Anesthesia Preprocedure Evaluation (Addendum)
Anesthesia Evaluation  Patient identified by MRN, date of birth, ID band Patient awake    Reviewed: Allergy & Precautions, NPO status , Patient's Chart, lab work & pertinent test results, reviewed documented beta blocker date and time   Airway Mallampati: III  TM Distance: >3 FB Neck ROM: Full    Dental no notable dental hx. (+) Edentulous Upper, Edentulous Lower   Pulmonary COPD, Current Smoker and Patient abstained from smoking.,    Pulmonary exam normal breath sounds clear to auscultation       Cardiovascular hypertension, Pt. on home beta blockers and Pt. on medications + angina + CAD, + CABG (3v CABG 2016), + Peripheral Vascular Disease and +CHF  Normal cardiovascular exam Rhythm:Regular Rate:Normal  HLD  TTE 07/2018 Normal LVEF, valves ok     Neuro/Psych negative neurological ROS  negative psych ROS   GI/Hepatic negative GI ROS, Neg liver ROS,   Endo/Other  negative endocrine ROS  Renal/GU negative Renal ROS  negative genitourinary   Musculoskeletal negative musculoskeletal ROS (+)   Abdominal   Peds  Hematology  (+) Blood dyscrasia (on xarelto), anemia ,   Anesthesia Other Findings EGD/colonoscopy for anemia/screening  Reproductive/Obstetrics                            Anesthesia Physical Anesthesia Plan  ASA: III  Anesthesia Plan: MAC   Post-op Pain Management:    Induction: Intravenous  PONV Risk Score and Plan: Propofol infusion and Treatment may vary due to age or medical condition  Airway Management Planned: Natural Airway  Additional Equipment:   Intra-op Plan:   Post-operative Plan:   Informed Consent: I have reviewed the patients History and Physical, chart, labs and discussed the procedure including the risks, benefits and alternatives for the proposed anesthesia with the patient or authorized representative who has indicated his/her understanding and  acceptance.     Dental advisory given  Plan Discussed with: CRNA  Anesthesia Plan Comments:         Anesthesia Quick Evaluation

## 2018-10-31 NOTE — Anesthesia Postprocedure Evaluation (Signed)
Anesthesia Post Note  Patient: Caleb [name]  Procedure(s) Performed: ESOPHAGOGASTRODUODENOSCOPY (EGD) WITH PROPOFOL (N/A ) COLONOSCOPY WITH PROPOFOL (N/A ) POLYPECTOMY HEMOSTASIS CLIP PLACEMENT BIOPSY SUBMUCOSAL LIFTING INJECTION     Patient location during evaluation: Endoscopy Anesthesia Type: MAC Level of consciousness: awake and alert Pain management: pain level controlled Vital Signs Assessment: post-procedure vital signs reviewed and stable Respiratory status: spontaneous breathing, nonlabored ventilation, respiratory function stable and patient connected to nasal cannula oxygen Cardiovascular status: blood pressure returned to baseline and stable Postop Assessment: no apparent nausea or vomiting Anesthetic complications: no    Last Vitals:  Vitals:   10/31/18 1230 10/31/18 1240  BP: (!) 188/59 (!) 193/65  Pulse: (!) 52 (!) 57  Resp: (!) 22 19  Temp:    SpO2: 97% 99%    Last Pain:  Vitals:   10/31/18 1240  TempSrc:   PainSc: 0-No pain                 Kimmy Parish L Athan Casalino

## 2018-10-31 NOTE — Discharge Instructions (Signed)
Restart Xarelto on Friday November 07, 2018    YOU HAD AN ENDOSCOPIC PROCEDURE TODAY: Refer to the procedure report and other information in the discharge instructions given to you for any specific questions about what was found during the examination. If this information does not answer your questions, please call [place] at [phone] to clarify.   YOU SHOULD EXPECT: Some feelings of bloating in the abdomen. Passage of more gas than usual. Walking can help get rid of the air that was put into your GI tract during the procedure and reduce the bloating. If you had a lower endoscopy (such as a colonoscopy or flexible sigmoidoscopy) you may notice spotting of blood in your stool or on the toilet paper. Some abdominal soreness may be present for a day or two, also.  DIET: Your first meal following the procedure should be a light meal and then it is ok to progress to your normal diet. A half-sandwich or bowl of soup is an example of a good first meal. Heavy or fried foods are harder to digest and may make you feel nauseous or bloated. Drink plenty of fluids but you should avoid alcoholic beverages for 24 hours. If you had an esophageal dilation, please see attached information for diet.   ACTIVITY: Your care partner should take you home directly after the procedure. You should plan to take it easy, moving slowly for the rest of the day. You can resume normal activity the day after the procedure however YOU SHOULD NOT DRIVE, use power tools, machinery or perform tasks that involve climbing or major physical exertion for 24 hours (because of the sedation medicines used during the test).   SYMPTOMS TO REPORT IMMEDIATELY: A gastroenterologist can be reached at any hour. Please call [phone]  for any of the following symptoms:  Following lower endoscopy (colonoscopy, flexible sigmoidoscopy) Excessive amounts of blood in the stool  Significant tenderness, worsening of abdominal pains    Swelling of the abdomen that is new, acute  Fever of 100 or higher  Following upper endoscopy (EGD, EUS, ERCP, esophageal dilation) Vomiting of blood or coffee ground material  New, significant abdominal pain  New, significant chest pain or pain under the shoulder blades  Painful or persistently difficult swallowing  New shortness of breath  Black, tarry-looking or red, bloody stools  FOLLOW UP:  If any biopsies were taken you will be contacted by phone or by letter within the next 1-3 weeks. Call [phone]  if you have not heard about the biopsies in 3 weeks.  Please also call with any specific questions about appointments or follow up tests.

## 2018-10-31 NOTE — Op Note (Signed)
[facility] Patient Name: Caleb [name] Procedure Date: 10/31/2018 MRN: [id number] Attending MD: Carol Ada , MD Date of Birth: 1950-09-28 CSN: [id number] Age: 68 Admit Type: Outpatient Procedure:                Colonoscopy Indications:              Iron deficiency anemia Providers:                Carol Ada, MD, Ashley Jacobs, RN, Ladona Ridgel, Technician Referring MD:              Medicines:                Propofol per Anesthesia Complications:            No immediate complications. Estimated Blood Loss:     Estimated blood loss was minimal. Procedure:                Pre-Anesthesia Assessment:                           - Prior to the procedure, a History and Physical                            was performed, and patient medications and                            allergies were reviewed. The patient's tolerance of                            previous anesthesia was also reviewed. The risks                            and benefits of the procedure and the sedation                            options and risks were discussed with the patient.                            All questions were answered, and informed consent                            was obtained. Prior Anticoagulants: The patient has                            taken Eliquis (apixaban), last dose was 2 days                            prior to procedure. ASA Grade Assessment: III - A                            patient with severe systemic disease. After  reviewing the risks and benefits, the patient was                            deemed in satisfactory condition to undergo the                            procedure.                           - Sedation was administered by an anesthesia                            professional. Deep sedation was attained.                           After obtaining informed consent, the colonoscope                            was  passed under direct vision. Throughout the                            procedure, the patient's blood pressure, pulse, and                            oxygen saturations were monitored continuously. The                            [id number] Olympus pediatric colonscope                            was introduced through the anus and advanced to the                            the cecum, identified by appendiceal orifice and                            ileocecal valve. The colonoscopy was technically                            difficult and complex. The patient tolerated the                            procedure well. The quality of the bowel                            preparation was excellent. The ileocecal valve,                            appendiceal orifice, and rectum were photographed. Scope In: 10:05:48 AM Scope Out: [id number] AM Scope Withdrawal Time: 1 hour 34 minutes 5 seconds  Total Procedure Duration: 1 hour 38 minutes 30 seconds  Findings:      Two sessile polyps were found in the ascending colon and cecum. The       polyps were 20 to 30 mm in size. These polyps  were removed with a saline       injection-lift technique using a hot snare. Resection and retrieval were       complete. To prevent bleeding post-intervention, twelve hemostatic clips       were successfully placed (MR unsafe). There was no bleeding during the       procedure.      The patient exhibited at least 5 very large polyps. The cecal polyp,       which was adjacent to the TI, measured 2 cm. A large distal ascending       colon polyp was identified and it measured approximately 3 cm. Both       polyps were successfully removed with using submucosal injections and       piecemeal resection. The cecal polypectomy site was closed with 4       hemoclips and the ascending colon polyp was closed with 8 hemoclips.       There was a large mid ascending colon sessile polyp, a few proximal       transverse colon  polyps, a descending colon polyp, and a very large       sigmoid colon polyp that was not removed. The decision was made to       removed some of these polyps at a later date as all these polyps were       large. With his need for anticoagulation, the risk of bleeding from       these multiple polypectomy sites would be unacceptably high. Impression:               - Two 20 to 30 mm polyps in the ascending colon and                            in the cecum, removed using injection-lift and a                            hot snare. Resected and retrieved. Clips (MR                            unsafe) were placed. Moderate Sedation:      Not Applicable - Patient had care per Anesthesia. Recommendation:           - Patient has a contact number available for                            emergencies. The signs and symptoms of potential                            delayed complications were discussed with the                            patient. Return to normal activities tomorrow.                            Written discharge instructions were provided to the                            patient.                           -  Resume previous diet.                           - Continue present medications.                           - Await pathology results.                           - Repeat colonoscopy in 3 months for retreatment.                            He will most likely require 2-3 additional                            colonoscopies to safely eradicate all the polyps.                           - Resume Eliquis in 7 days. Procedure Code(s):        --- Professional ---                           [phone], Colonoscopy, flexible; with removal of                            tumor(s), polyp(s), or other lesion(s) by snare                            technique                           [id number], Colonoscopy, flexible; with directed                            submucosal injection(s), any substance Diagnosis  Code(s):        --- Professional ---                           K63.5, Polyp of colon                           D50.9, Iron deficiency anemia, unspecified CPT copyright 2019 [employer]. All rights reserved. The codes documented in this report are preliminary and upon coder review may  be revised to meet current compliance requirements. Carol Ada, MD Carol Ada, MD 10/31/2018 11:59:43 AM This report has been signed electronically. Number of Addenda: 0

## 2018-10-31 NOTE — Op Note (Signed)
[facility] Patient Name: Caleb [name] Procedure Date: 10/31/2018 MRN: [id number] Attending MD: Carol Ada , MD Date of Birth: Sep 19, 1950 CSN: [id number] Age: 68 Admit Type: Inpatient Procedure:                Small bowel enteroscopy Indications:              Iron deficiency anemia Providers:                Carol Ada, MD, Ashley Jacobs, RN, Ladona Ridgel, Technician Referring MD:              Medicines:                 Complications:            No immediate complications. Estimated Blood Loss:     Estimated blood loss: none. Procedure:                Pre-Anesthesia Assessment:                           - Prior to the procedure, a History and Physical                            was performed, and patient medications and                            allergies were reviewed. The patient's tolerance of                            previous anesthesia was also reviewed. The risks                            and benefits of the procedure and the sedation                            options and risks were discussed with the patient.                            All questions were answered, and informed consent                            was obtained. Prior Anticoagulants: The patient has                            taken Eliquis (apixaban), last dose was 2 days                            prior to procedure. ASA Grade Assessment: III - A                            patient with severe systemic disease. After                            reviewing  the risks and benefits, the patient was                            deemed in satisfactory condition to undergo the                            procedure.                           - Sedation was administered by an anesthesia                            professional. Deep sedation was attained.                           After obtaining informed consent, the endoscope was                            passed under direct  vision. Throughout the                            procedure, the patient's blood pressure, pulse, and                            oxygen saturations were monitored continuously. The                            [id number] Olympus pediatric colonscope                            was introduced through the mouth, and advanced to                            the small bowel distal to the Ligament of Treitz.                            After obtaining informed consent, the endoscope was                            passed under direct vision. Throughout the                            procedure, the patient's blood pressure, pulse, and                            oxygen saturations were monitored continuously.The                            small bowel enteroscopy was accomplished without                            difficulty. The patient tolerated the procedure                            well. Scope In:  Scope Out: Findings:      The esophagus was normal.      Diffuse mild inflammation with hemorrhage characterized by erythema was       found in the gastric fundus and in the gastric body. Biopsies were taken       with a cold forceps for histology.      A single 7 mm sessile polyp with no bleeding was found in the third       portion of the duodenum. The polyp was removed with a hot snare.       Resection and retrieval were complete. To prevent bleeding       post-intervention, two hemostatic clips were successfully placed (MR       unsafe). There was no bleeding during the procedure.      There was no evidence of significant pathology in the entire examined       portion of jejunum. Impression:               - Normal esophagus.                           - Gastritis with hemorrhage. Biopsied.                           - A single duodenal polyp. Resected and retrieved.                            Clips (MR unsafe) were placed.                           - The examined portion of the jejunum was  normal. Recommendation:           - Patient has a contact number available for                            emergencies. The signs and symptoms of potential                            delayed complications were discussed with the                            patient. Return to normal activities tomorrow.                            Written discharge instructions were provided to the                            patient.                           - Resume previous diet.                           - Await pathology results.                           - Hold Eliquis for an additional 7 days. Procedure Code(s):        --- Professional ---  [phone], Small intestinal endoscopy, enteroscopy                            beyond second portion of duodenum, not including                            ileum; with removal of tumor(s), polyp(s), or other                            lesion(s) by snare technique Diagnosis Code(s):        --- Professional ---                           K29.71, Gastritis, unspecified, with bleeding                           K31.7, Polyp of stomach and duodenum                           D50.9, Iron deficiency anemia, unspecified CPT copyright 2019 American [employer] Association. All rights reserved. The codes documented in this report are preliminary and upon coder review may  be revised to meet current compliance requirements. Carol Ada, MD Carol Ada, MD 10/31/2018 12:03:51 PM This report has been signed electronically. Number of Addenda: 0

## 2018-10-31 NOTE — Transfer of Care (Signed)
Immediate Anesthesia Transfer of Care Note  Patient: Caleb [name]  Procedure(s) Performed: ESOPHAGOGASTRODUODENOSCOPY (EGD) WITH PROPOFOL (N/A ) COLONOSCOPY WITH PROPOFOL (N/A ) POLYPECTOMY HEMOSTASIS CLIP PLACEMENT BIOPSY SUBMUCOSAL LIFTING INJECTION  Patient Location: PACU  Anesthesia Type:Spinal  Level of Consciousness: sedated, patient cooperative and responds to stimulation  Airway & Oxygen Therapy: Patient Spontanous Breathing and Patient connected to nasal cannula oxygen  Post-op Assessment: Report given to RN and Post -op Vital signs reviewed and stable  Post vital signs: Reviewed and stable  Last Vitals:  Vitals Value Taken Time  BP    Temp    Pulse 48 10/31/18 1151  Resp 15 10/31/18 1151  SpO2 97 % 10/31/18 1151  Vitals shown include unvalidated device data.  Last Pain:  Vitals:   10/31/18 0850  TempSrc: Oral  PainSc: 0-No pain         Complications: No apparent anesthesia complications

## 2018-11-03 LAB — SURGICAL PATHOLOGY

## 2018-11-07 ENCOUNTER — Ambulatory Visit: Payer: Medicare HMO | Admitting: Cardiovascular Disease

## 2019-02-03 ENCOUNTER — Telehealth (INDEPENDENT_AMBULATORY_CARE_PROVIDER_SITE_OTHER): Payer: Medicare HMO | Admitting: Cardiology

## 2019-02-03 ENCOUNTER — Telehealth: Payer: Self-pay

## 2019-02-03 ENCOUNTER — Encounter: Payer: Self-pay | Admitting: Cardiology

## 2019-02-03 VITALS — Ht 64.0 in | Wt 152.0 lb

## 2019-02-03 DIAGNOSIS — E785 Hyperlipidemia, unspecified: Secondary | ICD-10-CM

## 2019-02-03 DIAGNOSIS — Z951 Presence of aortocoronary bypass graft: Secondary | ICD-10-CM | POA: Diagnosis not present

## 2019-02-03 DIAGNOSIS — I701 Atherosclerosis of renal artery: Secondary | ICD-10-CM

## 2019-02-03 DIAGNOSIS — Z7901 Long term (current) use of anticoagulants: Secondary | ICD-10-CM | POA: Insufficient documentation

## 2019-02-03 DIAGNOSIS — Z720 Tobacco use: Secondary | ICD-10-CM

## 2019-02-03 DIAGNOSIS — I739 Peripheral vascular disease, unspecified: Secondary | ICD-10-CM

## 2019-02-03 DIAGNOSIS — K2971 Gastritis, unspecified, with bleeding: Secondary | ICD-10-CM | POA: Insufficient documentation

## 2019-02-03 DIAGNOSIS — Z7189 Other specified counseling: Secondary | ICD-10-CM

## 2019-02-03 DIAGNOSIS — K2931 Chronic superficial gastritis with bleeding: Secondary | ICD-10-CM

## 2019-02-03 DIAGNOSIS — I10 Essential (primary) hypertension: Secondary | ICD-10-CM | POA: Diagnosis not present

## 2019-02-03 DIAGNOSIS — J410 Simple chronic bronchitis: Secondary | ICD-10-CM

## 2019-02-03 MED ORDER — NITROGLYCERIN 0.4 MG SL SUBL: 0.4000 mg | SUBLINGUAL_TABLET | SUBLINGUAL | 3 refills | Status: DC | PRN

## 2019-02-03 NOTE — Progress Notes (Signed)
Virtual Visit via Telephone Note   This visit type was conducted due to national recommendations for restrictions regarding the COVID-19 Pandemic (e.g. social distancing) in an effort to limit this patient's exposure and mitigate transmission in our community.  Due to his co-morbid illnesses, this patient is at least at moderate risk for complications without adequate follow up.  This format is felt to be most appropriate for this patient at this time.  The patient did not have access to video technology/had technical difficulties with video requiring transitioning to audio format only (telephone).  All issues noted in this document were discussed and addressed.  No physical exam could be performed with this format.  Please refer to the patient's chart for his  consent to telehealth for [facility].   Date:  02/03/2019   ID:  Caleb [name], DOB [birth year], MRN [id number]  Patient Location: Home Provider Location: Home  PCP:  Christain Sacramento, MD  Cardiologist:  Jenkins Rouge, MD  Electrophysiologist:  None   Evaluation Performed:  Follow-Up Visit  Chief Complaint:  none  History of Present Illness:    Caleb [name] is a 69 y.o. male with a history of CAD. He had CABG x3 in 2016. Echocardiogram in July 2020 showed normal LV function. He also has peripheral vascular disease, he had a right femoropopliteal bypass graft April 2019 at [facility]. He has known left renal artery narrowing that is described as moderate. He has treated hypertension and dyslipidemia. He is 1/2 pack a day smoker. In October 2020 he had the endoscopy which showed some gastritis with superficial bleeding.  Patient was contacted today for routine follow-up. He denies having any chest pain. He monitors his blood pressure from time to time and says it is "okay" but could not give me any numbers. He followed up recently at [facility] with Dr. Jimmye Norman for his vascular disease. He is on chronic Xarelto for this.  The patient  does not have symptoms concerning for COVID-19 infection (fever, chills, cough, or new shortness of breath).    Past Medical History:  Diagnosis Date  . Acute respiratory failure with hypoxia ([place])   . Acute systolic congestive heart failure ([place]) 02/11/2014  . COPD (chronic obstructive pulmonary disease) ([place]) 02/11/2014  . Essential hypertension   . Hypercholesteremia   . 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 Tobacco abuse    Past Surgical History:  Procedure Laterality Date  . BIOPSY  10/31/2018   Procedure: BIOPSY;  Surgeon: Carol Ada, MD;  [facility];  Service: Endoscopy;;  . cataract surgery  Bilateral 12-2017 and 01-2018  . COLONOSCOPY WITH PROPOFOL N/A 10/31/2018   Procedure: COLONOSCOPY WITH PROPOFOL;  Surgeon: Carol Ada, MD;  [facility];  Service: Endoscopy;  Laterality: N/A;  . CORONARY ARTERY BYPASS GRAFT N/A 02/17/2014   Procedure: CORONARY ARTERY BYPASS GRAFTING (CABG);  Surgeon: Rexene Alberts, MD;  Location: [place];  Service: Open Heart Surgery;  Laterality: N/A;  Times 3 using bilateral mammary arteries and endoscopically harvested right saphenous vein  . ESOPHAGOGASTRODUODENOSCOPY (EGD) WITH PROPOFOL N/A 10/31/2018   Procedure: ESOPHAGOGASTRODUODENOSCOPY (EGD) WITH PROPOFOL;  Surgeon: Carol Ada, MD;  [facility];  Service: Endoscopy;  Laterality: N/A;  . FEMORAL ARTERY - FEMORAL ARTERY BYPASS GRAFT      right femoral artery to below-knee popliteal artery bypass with PTFE and right first ray amputation 04/25/17  .  HEMOSTASIS CLIP PLACEMENT  10/31/2018   Procedure: HEMOSTASIS CLIP PLACEMENT;  Surgeon: Carol Ada, MD;  [facility];  Service: Endoscopy;;  . INTRAOPERATIVE TRANSESOPHAGEAL ECHOCARDIOGRAM N/A 02/17/2014   Procedure: INTRAOPERATIVE TRANSESOPHAGEAL ECHOCARDIOGRAM;  Surgeon: Rexene Alberts, MD;  Location: [place];  Service: Open Heart Surgery;  Laterality: N/A;   . LEFT HEART CATHETERIZATION WITH CORONARY ANGIOGRAM N/A 02/12/2014   Procedure: LEFT HEART CATHETERIZATION WITH CORONARY ANGIOGRAM;  Surgeon: Sinclair Grooms, MD;  Location: [facility] CATH LAB;  Service: Cardiovascular;  Laterality: N/A;  . POLYPECTOMY  10/31/2018   Procedure: POLYPECTOMY;  Surgeon: Carol Ada, MD;  [facility];  Service: Endoscopy;;  . SUBMUCOSAL LIFTING INJECTION  10/31/2018   Procedure: SUBMUCOSAL LIFTING INJECTION;  Surgeon: Carol Ada, MD;  [facility];  Service: Endoscopy;;  . TOE AMPUTATION Right    right great toe      Current Meds  Medication Sig  . acetaminophen (TYLENOL) 500 MG tablet Take 500 mg by mouth 2 (two) times a day.   . albuterol (VENTOLIN HFA) 108 (90 Base) MCG/ACT inhaler Inhale 2 puffs into the lungs every 6 (six) hours as needed for wheezing or shortness of breath.  [name] amLODipine (NORVASC) 10 MG tablet Take 1 tablet (10 mg total) by mouth daily.  [name] atorvastatin (LIPITOR) 80 MG tablet Take 1 tablet (80 mg total) by mouth daily.  . carvedilol (COREG) 12.5 MG tablet Take 1 tablet (12.5 mg total) by mouth 2 (two) times daily.  . Cholecalciferol (VITAMIN D-3 PO) Take 1 capsule by mouth daily with breakfast.  . gabapentin (NEURONTIN) 300 MG capsule Take 300 mg by mouth 3 (three) times daily.  . vitamin B-12 (CYANOCOBALAMIN) 1000 MCG tablet Take 1,000 mcg by mouth daily with breakfast.  . XARELTO 20 MG TABS tablet Take 20 mg by mouth daily before breakfast.     Allergies:   Sulfamethoxazole-trimethoprim, Cilostazol, and Lisinopril   Social History   Tobacco Use  . Smoking status: Current Every Day Smoker    Packs/day: 0.50    Years: 35.00    Pack years: 17.50    Types: Cigarettes    Last attempt to quit: 02/11/2014    Years since quitting: 4.9  . Smokeless tobacco: Never Used  . Tobacco comment: has quit twice before   Substance Use Topics  . Alcohol use: Yes    Alcohol/week: 0.0 standard drinks    Comment: Six pack a  day of beer  . Drug use: Not on file     Family Hx: The patient's family history includes Cancer in his sister; Diabetes in his brother and mother; Heart attack in his brother and mother. There is no history of Stroke.  ROS:   Please see the history of present illness.    All other systems reviewed and are negative.   Prior CV studies:   The following studies were reviewed today: Echo July 2020  Labs/Other Tests and Data Reviewed:    EKG:  No ECG reviewed.  Recent Labs: 08/13/2018: Hemoglobin 9.0; Platelets 177; TSH 1.419 08/16/2018: BUN 7; Creatinine, Ser 0.88; Potassium 3.9; Sodium 129   Recent Lipid Panel Lab Results  Component Value Date/Time   CHOL 176 12/27/2015 02:48 PM   TRIG 247 (H) 12/27/2015 02:48 PM   HDL 52 12/27/2015 02:48 PM   CHOLHDL 3.4 12/27/2015 02:48 PM   LDLCALC 75 12/27/2015 02:48 PM    Wt Readings from Last 3 Encounters:  02/03/19 152 lb (68.9 kg)  10/31/18 152 lb (  68.9 kg)  09/17/18 155 lb (70.3 kg)     Objective:    Vital Signs:  Ht 5\' 4"  (1.626 m)   Wt 152 lb (68.9 kg)   BMI 26.09 kg/m    VITAL SIGNS:  reviewed  ASSESSMENT & PLAN:    CABG x 3 2016- No recent angina, will provide an Rx for SL NTG PRN.  PVD- S/p RFP and ray procedure April 2019, he follows with dr Jimmye Norman at [facility].   Chronic anticoagulation- On Xarelto for PVD  HTN- B/P 152/66 at [facility] 01/30/2019  HLD- On high dose statin Rx followed by PCP   COVID-19 Education: The signs and symptoms of COVID-19 were discussed with the patient and how to seek care for testing (follow up with PCP or arrange E-visit).  The importance of social distancing was discussed today.  Time:   Today, I have spent 10 minutes with the patient with telehealth technology discussing the above problems.     Medication Adjustments/Labs and Tests Ordered: Current medicines are reviewed at length with the patient today.  Concerns regarding medicines are outlined above.   Tests Ordered: No  orders of the defined types were placed in this encounter.   Medication Changes: Meds ordered this encounter  Medications  . nitroGLYCERIN (NITROSTAT) 0.4 MG SL tablet    Sig: Place 1 tablet (0.4 mg total) under the tongue every 5 (five) minutes as needed for chest pain.    Dispense:  25 tablet    Refill:  3    Follow Up:  In Person Dr Johnsie Cancel as scheduled  Signed, Kerin Ransom, PA-C  02/03/2019 3:46 PM    [facility]

## 2019-02-03 NOTE — Patient Instructions (Addendum)
Medication Instructions:  Your physician recommends that you continue on your current medications as directed. Please refer to the Current Medication list given to you today. *If you need a refill on your cardiac medications before your next appointment, please call your pharmacy*  Lab Work: None  If you have labs (blood work) drawn today and your tests are completely normal, you will receive your results only by: [name] MyChart Message (if you have MyChart) OR . A paper copy in the mail If you have any lab test that is abnormal or we need to change your treatment, we will call you to review the results.  Testing/Procedures: None   Follow-Up: At [facility], you and your health needs are our priority.  As part of our continuing mission to provide you with exceptional heart care, we have created designated Provider Care Teams.  These Care Teams include your primary Cardiologist (physician) and Advanced Practice Providers (APPs -  Physician Assistants and Nurse Practitioners) who all work together to provide you with the care you need, when you need it.  Your next appointment:   12 month(s)  The format for your next appointment:   In Person  Provider:   Jenkins Rouge, MD You can keep your follow up in March with Dr Johnsie Cancel if you like but it is not necessary.   Other Instructions

## 2019-02-03 NOTE — Telephone Encounter (Signed)

## 2019-04-07 NOTE — Progress Notes (Signed)
Date:  04/10/2019   ID:  Caleb [name], DOB [birth year], MRN [id number]  PCP:  Christain Sacramento, MD  Cardiologist:  Jenkins Rouge, MD  Electrophysiologist:  None   Evaluation Performed:  Follow-Up Visit  Chief Complaint:  none  History of Present Illness:    Caleb [name] is a 68 y.o. male with a history of CAD. He had CABG x3 in 2016. Echocardiogram in July 2020 showed normal LV function. He also has peripheral vascular disease, he had a right femoropopliteal bypass graft April 2019 at [facility]. He has known left renal artery narrowing that is described as moderate. He has treated hypertension and dyslipidemia. He is 1/2 pack a day smoker. In October 2020 he had the endoscopy which showed some gastritis with superficial bleeding.  No angina BP good at home  He followed up recently at [facility] with Dr. Jimmye Norman for his vascular disease. He is on chronic Xarelto for this.  Discussed vaccine for COVID but he does not want to get it  Past Medical History:  Diagnosis Date  . Acute respiratory failure with hypoxia ([place])   . Acute systolic congestive heart failure ([place]) 02/11/2014  . COPD (chronic obstructive pulmonary disease) ([place]) 02/11/2014  . Essential hypertension   . Hypercholesteremia   . 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 Tobacco abuse    Past Surgical History:  Procedure Laterality Date  . BIOPSY  10/31/2018   Procedure: BIOPSY;  Surgeon: Carol Ada, MD;  [facility];  Service: Endoscopy;;  . cataract surgery  Bilateral 12-2017 and 01-2018  . COLONOSCOPY WITH PROPOFOL N/A 10/31/2018   Procedure: COLONOSCOPY WITH PROPOFOL;  Surgeon: Carol Ada, MD;  [facility];  Service: Endoscopy;  Laterality: N/A;  . CORONARY ARTERY BYPASS GRAFT N/A 02/17/2014   Procedure: CORONARY ARTERY BYPASS GRAFTING (CABG);  Surgeon: Rexene Alberts, MD;  Location: [place];  Service: Open Heart Surgery;  Laterality: N/A;  Times 3  using bilateral mammary arteries and endoscopically harvested right saphenous vein  . ESOPHAGOGASTRODUODENOSCOPY (EGD) WITH PROPOFOL N/A 10/31/2018   Procedure: ESOPHAGOGASTRODUODENOSCOPY (EGD) WITH PROPOFOL;  Surgeon: Carol Ada, MD;  [facility];  Service: Endoscopy;  Laterality: N/A;  . FEMORAL ARTERY - FEMORAL ARTERY BYPASS GRAFT      right femoral artery to below-knee popliteal artery bypass with PTFE and right first ray amputation 04/25/17  . HEMOSTASIS CLIP PLACEMENT  10/31/2018   Procedure: HEMOSTASIS CLIP PLACEMENT;  Surgeon: Carol Ada, MD;  [facility];  Service: Endoscopy;;  . INTRAOPERATIVE TRANSESOPHAGEAL ECHOCARDIOGRAM N/A 02/17/2014   Procedure: INTRAOPERATIVE TRANSESOPHAGEAL ECHOCARDIOGRAM;  Surgeon: Rexene Alberts, MD;  Location: [place];  Service: Open Heart Surgery;  Laterality: N/A;  . LEFT HEART CATHETERIZATION WITH CORONARY ANGIOGRAM N/A 02/12/2014   Procedure: LEFT HEART CATHETERIZATION WITH CORONARY ANGIOGRAM;  Surgeon: Sinclair Grooms, MD;  Location: [facility] CATH LAB;  Service: Cardiovascular;  Laterality: N/A;  . POLYPECTOMY  10/31/2018   Procedure: POLYPECTOMY;  Surgeon: Carol Ada, MD;  [facility];  Service: Endoscopy;;  . SUBMUCOSAL LIFTING INJECTION  10/31/2018   Procedure: SUBMUCOSAL LIFTING INJECTION;  Surgeon: Carol Ada, MD;  [facility];  Service: Endoscopy;;  . TOE AMPUTATION Right    right great toe      Current Meds  Medication Sig  . acetaminophen (TYLENOL) 500 MG tablet Take 500 mg by mouth 2 (two) times a day.   [name]  albuterol (VENTOLIN HFA) 108 (90 Base) MCG/ACT inhaler Inhale 2 puffs into the lungs every 6 (six) hours as needed for wheezing or shortness of breath.  [name] amLODipine (NORVASC) 10 MG tablet Take 1 tablet (10 mg total) by mouth daily.  [name] atorvastatin (LIPITOR) 80 MG tablet Take 1 tablet (80 mg total) by mouth daily.  . carvedilol (COREG) 12.5 MG tablet Take 1 tablet (12.5 mg total) by mouth 2  (two) times daily.  . Cholecalciferol (VITAMIN D-3 PO) Take 1 capsule by mouth daily with breakfast.  . gabapentin (NEURONTIN) 300 MG capsule Take 300 mg by mouth 3 (three) times daily.  . nitroGLYCERIN (NITROSTAT) 0.4 MG SL tablet Place 1 tablet (0.4 mg total) under the tongue every 5 (five) minutes as needed for chest pain.  . vitamin B-12 (CYANOCOBALAMIN) 1000 MCG tablet Take 1,000 mcg by mouth daily with breakfast.  . XARELTO 20 MG TABS tablet Take 20 mg by mouth daily before breakfast.     Allergies:   Sulfamethoxazole-trimethoprim, Cilostazol, and Lisinopril   Social History   Tobacco Use  . Smoking status: Current Every Day Smoker    Packs/day: 0.50    Years: 35.00    Pack years: 17.50    Types: Cigarettes    Last attempt to quit: 02/11/2014    Years since quitting: 5.1  . Smokeless tobacco: Never Used  . Tobacco comment: has quit twice before   Substance Use Topics  . Alcohol use: Yes    Alcohol/week: 0.0 standard drinks    Comment: Six pack a day of beer  . Drug use: Not on file     Family Hx: The patient's family history includes Cancer in his sister; Diabetes in his brother and mother; Heart attack in his brother and mother. There is no history of Stroke.  ROS:   Please see the history of present illness.    All other systems reviewed and are negative.   Prior CV studies:   The following studies were reviewed today: Echo July 2020  Labs/Other Tests and Data Reviewed:    EKG:  SR rate 3 PVC old IMI 08/25/18   Recent Labs: 08/13/2018: Hemoglobin 9.0; Platelets 177; TSH 1.419 08/16/2018: BUN 7; Creatinine, Ser 0.88; Potassium 3.9; Sodium 129   Recent Lipid Panel Lab Results  Component Value Date/Time   CHOL 176 12/27/2015 02:48 PM   TRIG 247 (H) 12/27/2015 02:48 PM   HDL 52 12/27/2015 02:48 PM   CHOLHDL 3.4 12/27/2015 02:48 PM   LDLCALC 75 12/27/2015 02:48 PM    Wt Readings from Last 3 Encounters:  04/10/19 149 lb (67.6 kg)  02/03/19 152 lb (68.9 kg)   10/31/18 152 lb (68.9 kg)     Objective:    Vital Signs:  BP (!) 122/46   Pulse 69   Ht 5\' 4"  (1.626 m)   Wt 149 lb (67.6 kg)   SpO2 98%   BMI 25.58 kg/m    Affect appropriate Healthy:  appears stated age HEENT: normal Neck supple with no adenopathy JVP normal no bruits no thyromegaly Lungs clear with no wheezing and good diaphragmatic motion Heart:  S1/S2 no murmur, no rub, gallop or click PMI normal post sternotomy  Abdomen: benighn, BS positve, no tenderness, no AAA no bruit.  No HSM or HJR Post right fem-pop with femoral bruit  No edema Neuro non-focal Skin warm and dry No muscular weakness   ASSESSMENT & PLAN:    CABG x 3 2016- No recent angina, will provide an  Rx for SL NTG PRN.  PVD- S/p RFP and ray procedure April 2019, he follows with dr Jimmye Norman at [facility].   Chronic anticoagulation- On Xarelto for PVD no bleeding issues   HTN- Well controlled.  Continue current medications and low sodium Dash type diet.    HLD- On high dose statin Rx followed by PCP  Smoking:   Counseled on cessation <10 minutes consider f/u lung cancer screening CT per primary    COVID-19 Education: The signs and symptoms of COVID-19 were discussed with the patient and how to seek care for testing (follow up with PCP or arrange E-visit).  The importance of social distancing was discussed today.   Medication Adjustments/Labs and Tests Ordered: Current medicines are reviewed at length with the patient today.  Concerns regarding medicines are outlined above.   Tests Ordered:  None    Medication Changes: No orders of the defined types were placed in this encounter.   Follow Up:  With me in a year   Signed, Jenkins Rouge, MD  04/10/2019 11:09 AM    [place]

## 2019-04-10 ENCOUNTER — Other Ambulatory Visit: Payer: Self-pay

## 2019-04-10 ENCOUNTER — Encounter: Payer: Self-pay | Admitting: Cardiovascular Disease

## 2019-04-10 ENCOUNTER — Ambulatory Visit: Payer: Medicare HMO | Admitting: Cardiovascular Disease

## 2019-04-10 VITALS — BP 122/46 | HR 69 | Ht 64.0 in | Wt 149.0 lb

## 2019-04-10 DIAGNOSIS — Z951 Presence of aortocoronary bypass graft: Secondary | ICD-10-CM

## 2019-04-10 NOTE — Patient Instructions (Signed)
Medication Instructions:  Your physician recommends that you continue on your current medications as directed. Please refer to the Current Medication list given to you today.  *If you need a refill on your cardiac medications before your next appointment, please call your pharmacy*   Lab Work: None ordered  If you have labs (blood work) drawn today and your tests are completely normal, you will receive your results only by: [name] MyChart Message (if you have MyChart) OR . A paper copy in the mail If you have any lab test that is abnormal or we need to change your treatment, we will call you to review the results.   Testing/Procedures: None ordered   Follow-Up: At [facility], you and your health needs are our priority.  As part of our continuing mission to provide you with exceptional heart care, we have created designated Provider Care Teams.  These Care Teams include your primary Cardiologist (physician) and Advanced Practice Providers (APPs -  Physician Assistants and Nurse Practitioners) who all work together to provide you with the care you need, when you need it.  We recommend signing up for the patient portal called "MyChart".  Sign up information is provided on this After Visit Summary.  MyChart is used to connect with patients for Virtual Visits (Telemedicine).  Patients are able to view lab/test results, encounter notes, upcoming appointments, etc.  Non-urgent messages can be sent to your provider as well.   To learn more about what you can do with MyChart, go to [email or web address].    Your next appointment:   2 month(s)  The format for your next appointment:   In Person  Provider:   You may see Jenkins Rouge, MD or one of the following Advanced Practice Providers on your designated Care Team:    Truitt Merle, NP  Cecilie Kicks, NP  Kathyrn Drown, NP    Other Instructions

## 2019-08-07 ENCOUNTER — Inpatient Hospital Stay (HOSPITAL_COMMUNITY): Payer: Medicare HMO

## 2019-08-07 ENCOUNTER — Inpatient Hospital Stay (HOSPITAL_COMMUNITY)
Admission: EM | Admit: 2019-08-07 | Discharge: 2019-08-16 | DRG: 871 | Disposition: A | Discharge status: Home Health | Payer: Medicare HMO | Attending: Internal Medicine | Admitting: Internal Medicine

## 2019-08-07 ENCOUNTER — Encounter (HOSPITAL_COMMUNITY): Payer: Self-pay | Admitting: Emergency Medicine

## 2019-08-07 ENCOUNTER — Other Ambulatory Visit: Payer: Self-pay

## 2019-08-07 ENCOUNTER — Emergency Department (HOSPITAL_COMMUNITY): Payer: Medicare HMO

## 2019-08-07 DIAGNOSIS — Z833 Family history of diabetes mellitus: Secondary | ICD-10-CM

## 2019-08-07 DIAGNOSIS — I10 Essential (primary) hypertension: Secondary | ICD-10-CM | POA: Diagnosis present

## 2019-08-07 DIAGNOSIS — I5022 Chronic systolic (congestive) heart failure: Secondary | ICD-10-CM

## 2019-08-07 DIAGNOSIS — I509 Heart failure, unspecified: Secondary | ICD-10-CM

## 2019-08-07 DIAGNOSIS — Z8249 Family history of ischemic heart disease and other diseases of the circulatory system: Secondary | ICD-10-CM | POA: Diagnosis not present

## 2019-08-07 DIAGNOSIS — I5023 Acute on chronic systolic (congestive) heart failure: Secondary | ICD-10-CM | POA: Diagnosis not present

## 2019-08-07 DIAGNOSIS — E785 Hyperlipidemia, unspecified: Secondary | ICD-10-CM | POA: Diagnosis present

## 2019-08-07 DIAGNOSIS — E871 Hypo-osmolality and hyponatremia: Secondary | ICD-10-CM | POA: Diagnosis present

## 2019-08-07 DIAGNOSIS — J189 Pneumonia, unspecified organism: Secondary | ICD-10-CM | POA: Diagnosis present

## 2019-08-07 DIAGNOSIS — R6510 Systemic inflammatory response syndrome (SIRS) of non-infectious origin without acute organ dysfunction: Secondary | ICD-10-CM | POA: Diagnosis present

## 2019-08-07 DIAGNOSIS — E7800 Pure hypercholesterolemia, unspecified: Secondary | ICD-10-CM | POA: Diagnosis present

## 2019-08-07 DIAGNOSIS — I429 Cardiomyopathy, unspecified: Secondary | ICD-10-CM | POA: Diagnosis present

## 2019-08-07 DIAGNOSIS — F1010 Alcohol abuse, uncomplicated: Secondary | ICD-10-CM | POA: Diagnosis present

## 2019-08-07 DIAGNOSIS — I5021 Acute systolic (congestive) heart failure: Secondary | ICD-10-CM

## 2019-08-07 DIAGNOSIS — Z720 Tobacco use: Secondary | ICD-10-CM | POA: Diagnosis present

## 2019-08-07 DIAGNOSIS — Z20822 Contact with and (suspected) exposure to covid-19: Secondary | ICD-10-CM | POA: Diagnosis present

## 2019-08-07 DIAGNOSIS — R0602 Shortness of breath: Secondary | ICD-10-CM

## 2019-08-07 DIAGNOSIS — R778 Other specified abnormalities of plasma proteins: Secondary | ICD-10-CM | POA: Diagnosis present

## 2019-08-07 DIAGNOSIS — A419 Sepsis, unspecified organism: Secondary | ICD-10-CM | POA: Diagnosis not present

## 2019-08-07 DIAGNOSIS — Z79899 Other long term (current) drug therapy: Secondary | ICD-10-CM

## 2019-08-07 DIAGNOSIS — I739 Peripheral vascular disease, unspecified: Secondary | ICD-10-CM | POA: Diagnosis present

## 2019-08-07 DIAGNOSIS — Z7901 Long term (current) use of anticoagulants: Secondary | ICD-10-CM

## 2019-08-07 DIAGNOSIS — I5043 Acute on chronic combined systolic (congestive) and diastolic (congestive) heart failure: Secondary | ICD-10-CM | POA: Diagnosis present

## 2019-08-07 DIAGNOSIS — Z951 Presence of aortocoronary bypass graft: Secondary | ICD-10-CM | POA: Diagnosis not present

## 2019-08-07 DIAGNOSIS — I5042 Chronic combined systolic (congestive) and diastolic (congestive) heart failure: Secondary | ICD-10-CM | POA: Diagnosis not present

## 2019-08-07 DIAGNOSIS — I5031 Acute diastolic (congestive) heart failure: Secondary | ICD-10-CM | POA: Diagnosis not present

## 2019-08-07 DIAGNOSIS — I2510 Atherosclerotic heart disease of native coronary artery without angina pectoris: Secondary | ICD-10-CM | POA: Diagnosis present

## 2019-08-07 DIAGNOSIS — Z809 Family history of malignant neoplasm, unspecified: Secondary | ICD-10-CM | POA: Diagnosis not present

## 2019-08-07 DIAGNOSIS — F17210 Nicotine dependence, cigarettes, uncomplicated: Secondary | ICD-10-CM | POA: Diagnosis present

## 2019-08-07 DIAGNOSIS — R509 Fever, unspecified: Secondary | ICD-10-CM

## 2019-08-07 DIAGNOSIS — J441 Chronic obstructive pulmonary disease with (acute) exacerbation: Secondary | ICD-10-CM | POA: Diagnosis present

## 2019-08-07 DIAGNOSIS — I110 Hypertensive heart disease with heart failure: Secondary | ICD-10-CM | POA: Diagnosis present

## 2019-08-07 DIAGNOSIS — J9601 Acute respiratory failure with hypoxia: Secondary | ICD-10-CM

## 2019-08-07 DIAGNOSIS — D509 Iron deficiency anemia, unspecified: Secondary | ICD-10-CM | POA: Diagnosis present

## 2019-08-07 DIAGNOSIS — T502X5A Adverse effect of carbonic-anhydrase inhibitors, benzothiadiazides and other diuretics, initial encounter: Secondary | ICD-10-CM | POA: Diagnosis not present

## 2019-08-07 DIAGNOSIS — I248 Other forms of acute ischemic heart disease: Secondary | ICD-10-CM | POA: Diagnosis present

## 2019-08-07 DIAGNOSIS — E876 Hypokalemia: Secondary | ICD-10-CM | POA: Diagnosis not present

## 2019-08-07 DIAGNOSIS — E222 Syndrome of inappropriate secretion of antidiuretic hormone: Secondary | ICD-10-CM | POA: Diagnosis present

## 2019-08-07 DIAGNOSIS — J449 Chronic obstructive pulmonary disease, unspecified: Secondary | ICD-10-CM | POA: Diagnosis not present

## 2019-08-07 DIAGNOSIS — R7989 Other specified abnormal findings of blood chemistry: Secondary | ICD-10-CM | POA: Diagnosis present

## 2019-08-07 DIAGNOSIS — D72825 Bandemia: Secondary | ICD-10-CM

## 2019-08-07 DIAGNOSIS — Z882 Allergy status to sulfonamides status: Secondary | ICD-10-CM

## 2019-08-07 DIAGNOSIS — D649 Anemia, unspecified: Secondary | ICD-10-CM | POA: Diagnosis not present

## 2019-08-07 DIAGNOSIS — Z888 Allergy status to other drugs, medicaments and biological substances status: Secondary | ICD-10-CM

## 2019-08-07 DIAGNOSIS — J9600 Acute respiratory failure, unspecified whether with hypoxia or hypercapnia: Secondary | ICD-10-CM | POA: Diagnosis not present

## 2019-08-07 LAB — BASIC METABOLIC PANEL
Anion gap: 12 (ref 5–15)
BUN: 8 mg/dL (ref 8–23)
CO2: 21 mmol/L — ABNORMAL LOW (ref 22–32)
Calcium: 9 mg/dL (ref 8.9–10.3)
Chloride: 92 mmol/L — ABNORMAL LOW (ref 98–111)
Creatinine, Ser: 0.81 mg/dL (ref 0.61–1.24)
GFR calc Af Amer: >60 mL/min (ref >60)
GFR calc non Af Amer: >60 mL/min (ref >60)
Glucose, Bld: 144 mg/dL — ABNORMAL HIGH (ref 70–99)
Potassium: 3.5 mmol/L (ref 3.5–5.1)
Sodium: 125 mmol/L — ABNORMAL LOW (ref 135–145)

## 2019-08-07 LAB — URINALYSIS, ROUTINE W REFLEX MICROSCOPIC
Bilirubin Urine: NEGATIVE
Glucose, UA: 50 mg/dL — AB
Hgb urine dipstick: NEGATIVE
Ketones, ur: NEGATIVE mg/dL
Leukocytes,Ua: NEGATIVE
Nitrite: NEGATIVE
Protein, ur: 100 mg/dL — AB
Specific Gravity, Urine: 1.015 (ref 1.005–1.030)
pH: 6 (ref 5.0–8.0)

## 2019-08-07 LAB — I-STAT ARTERIAL BLOOD GAS, ED
Acid-base deficit: 1 mmol/L (ref 0.0–2.0)
Bicarbonate: 23.1 mmol/L (ref 20.0–28.0)
Calcium, Ion: 1.19 mmol/L (ref 1.15–1.40)
HCT: 25 % — ABNORMAL LOW (ref 39.0–52.0)
Hemoglobin: 8.5 g/dL — ABNORMAL LOW (ref 13.0–17.0)
O2 Saturation: 100 %
Patient temperature: 99.5
Potassium: 4 mmol/L (ref 3.5–5.1)
Sodium: 124 mmol/L — ABNORMAL LOW (ref 135–145)
TCO2: 24 mmol/L (ref 22–32)
pCO2 arterial: 36.1 mmHg (ref 32.0–48.0)
pH, Arterial: 7.416 (ref 7.350–7.450)
pO2, Arterial: 287 mmHg — ABNORMAL HIGH (ref 83.0–108.0)

## 2019-08-07 LAB — FERRITIN: Ferritin: 51 ng/mL (ref 24–336)

## 2019-08-07 LAB — PHOSPHORUS: Phosphorus: 3.2 mg/dL (ref 2.5–4.6)

## 2019-08-07 LAB — ECHOCARDIOGRAM COMPLETE
Area-P 1/2: 7.16 cm2
Calc EF: 46.3 %
Height: 67 in
S' Lateral: 3.99 cm
Single Plane A2C EF: 44.2 %
Single Plane A4C EF: 46.8 %
Weight: 2645.52 oz

## 2019-08-07 LAB — RESPIRATORY PANEL BY PCR

## 2019-08-07 LAB — PROCALCITONIN: Procalcitonin: 0.84 ng/mL

## 2019-08-07 LAB — CBC WITH DIFFERENTIAL/PLATELET
Abs Immature Granulocytes: 0.18 10*3/uL — ABNORMAL HIGH (ref 0.00–0.07)
Basophils Absolute: 0.1 10*3/uL (ref 0.0–0.1)
Basophils Relative: 0 %
Eosinophils Absolute: 0 10*3/uL (ref 0.0–0.5)
Eosinophils Relative: 0 %
HCT: 25.2 % — ABNORMAL LOW (ref 39.0–52.0)
Hemoglobin: 7.6 g/dL — ABNORMAL LOW (ref 13.0–17.0)
Immature Granulocytes: 1 %
Lymphocytes Relative: 3 %
Lymphs Abs: 0.5 10*3/uL — ABNORMAL LOW (ref 0.7–4.0)
MCH: 23.8 pg — ABNORMAL LOW (ref 26.0–34.0)
MCHC: 30.2 g/dL (ref 30.0–36.0)
MCV: 79 fL — ABNORMAL LOW (ref 80.0–100.0)
Monocytes Absolute: 1.2 10*3/uL — ABNORMAL HIGH (ref 0.1–1.0)
Monocytes Relative: 7 %
Neutro Abs: 14.1 10*3/uL — ABNORMAL HIGH (ref 1.7–7.7)
Neutrophils Relative %: 89 %
Platelets: 228 10*3/uL (ref 150–400)
RBC: 3.19 MIL/uL — ABNORMAL LOW (ref 4.22–5.81)
RDW: 16.8 % — ABNORMAL HIGH (ref 11.5–15.5)
WBC: 16 10*3/uL — ABNORMAL HIGH (ref 4.0–10.5)
nRBC: 0 % (ref 0.0–0.2)

## 2019-08-07 LAB — COMPREHENSIVE METABOLIC PANEL
ALT: 11 U/L (ref 0–44)
AST: 24 U/L (ref 15–41)
Albumin: 3 g/dL — ABNORMAL LOW (ref 3.5–5.0)
Alkaline Phosphatase: 53 U/L (ref 38–126)
Anion gap: 11 (ref 5–15)
BUN: 6 mg/dL — ABNORMAL LOW (ref 8–23)
CO2: 21 mmol/L — ABNORMAL LOW (ref 22–32)
Calcium: 8.8 mg/dL — ABNORMAL LOW (ref 8.9–10.3)
Chloride: 92 mmol/L — ABNORMAL LOW (ref 98–111)
Creatinine, Ser: 0.85 mg/dL (ref 0.61–1.24)
GFR calc Af Amer: >60 mL/min (ref >60)
GFR calc non Af Amer: >60 mL/min (ref >60)
Glucose, Bld: 179 mg/dL — ABNORMAL HIGH (ref 70–99)
Potassium: 4 mmol/L (ref 3.5–5.1)
Sodium: 124 mmol/L — ABNORMAL LOW (ref 135–145)
Total Bilirubin: 0.8 mg/dL (ref 0.3–1.2)
Total Protein: 6.4 g/dL — ABNORMAL LOW (ref 6.5–8.1)

## 2019-08-07 LAB — APTT: aPTT: 36 seconds (ref 24–36)

## 2019-08-07 LAB — PROTIME-INR
INR: 2 — ABNORMAL HIGH (ref 0.8–1.2)
Prothrombin Time: 21.6 seconds — ABNORMAL HIGH (ref 11.4–15.2)

## 2019-08-07 LAB — IRON AND TIBC
Iron: 7 ug/dL — ABNORMAL LOW (ref 45–182)
Saturation Ratios: 2 % — ABNORMAL LOW (ref 17.9–39.5)
TIBC: 326 ug/dL (ref 250–450)
UIBC: 319 ug/dL

## 2019-08-07 LAB — OSMOLALITY: Osmolality: 272 mOsm/kg — ABNORMAL LOW (ref 275–295)

## 2019-08-07 LAB — BRAIN NATRIURETIC PEPTIDE: B Natriuretic Peptide: 1092 pg/mL — ABNORMAL HIGH (ref 0.0–100.0)

## 2019-08-07 LAB — LACTIC ACID, PLASMA
Lactic Acid, Venous: 1.9 mmol/L (ref 0.5–1.9)
Lactic Acid, Venous: 1.4 mmol/L (ref 0.5–1.9)

## 2019-08-07 LAB — TROPONIN I (HIGH SENSITIVITY)
Troponin I (High Sensitivity): 68 ng/L — ABNORMAL HIGH (ref <18)
Troponin I (High Sensitivity): 201 ng/L (ref <18)

## 2019-08-07 LAB — MAGNESIUM: Magnesium: 1.9 mg/dL (ref 1.7–2.4)

## 2019-08-07 LAB — HEMOGLOBIN AND HEMATOCRIT, BLOOD
HCT: 26.4 % — ABNORMAL LOW (ref 39.0–52.0)
Hemoglobin: 7.8 g/dL — ABNORMAL LOW (ref 13.0–17.0)

## 2019-08-07 LAB — SODIUM, URINE, RANDOM: Sodium, Ur: 26 mmol/L

## 2019-08-07 LAB — SARS CORONAVIRUS 2 BY RT PCR (HOSPITAL ORDER, PERFORMED IN ~~LOC~~ HOSPITAL LAB): SARS Coronavirus 2: NEGATIVE

## 2019-08-07 MED ORDER — FOLIC ACID 1 MG PO TABS
1.0000 mg | ORAL_TABLET | Freq: Every day | ORAL | Status: DC
  Administered 2019-08-07 – 2019-08-16 (×10): 1 mg via ORAL
  Filled 2019-08-07 (×10): qty 1

## 2019-08-07 MED ORDER — SODIUM CHLORIDE 0.9 % IV SOLN
500.0000 mg | INTRAVENOUS | Status: DC
  Administered 2019-08-07 – 2019-08-15 (×9): 500 mg via INTRAVENOUS
  Filled 2019-08-07 (×10): qty 500

## 2019-08-07 MED ORDER — THIAMINE HCL 100 MG/ML IJ SOLN
100.0000 mg | Freq: Every day | INTRAMUSCULAR | Status: DC
  Administered 2019-08-07: 100 mg via INTRAVENOUS
  Filled 2019-08-07: qty 2

## 2019-08-07 MED ORDER — RIVAROXABAN 20 MG PO TABS
20.0000 mg | ORAL_TABLET | Freq: Every day | ORAL | Status: DC
  Administered 2019-08-07 – 2019-08-09 (×3): 20 mg via ORAL
  Filled 2019-08-07 (×3): qty 1

## 2019-08-07 MED ORDER — AMLODIPINE BESYLATE 5 MG PO TABS
10.0000 mg | ORAL_TABLET | Freq: Every day | ORAL | Status: DC
  Administered 2019-08-07: 10 mg via ORAL
  Filled 2019-08-07: qty 2

## 2019-08-07 MED ORDER — CARVEDILOL 12.5 MG PO TABS
12.5000 mg | ORAL_TABLET | Freq: Two times a day (BID) | ORAL | Status: DC
  Administered 2019-08-07 – 2019-08-10 (×7): 12.5 mg via ORAL
  Filled 2019-08-07 (×7): qty 1

## 2019-08-07 MED ORDER — SODIUM CHLORIDE 0.9 % IV SOLN
250.0000 mL | INTRAVENOUS | Status: DC | PRN
  Administered 2019-08-08 – 2019-08-09 (×2): 250 mL via INTRAVENOUS

## 2019-08-07 MED ORDER — SODIUM CHLORIDE 0.9% FLUSH: 3.0000 mL | INTRAVENOUS | Status: DC | PRN

## 2019-08-07 MED ORDER — LORAZEPAM 2 MG/ML IJ SOLN: 1.0000 mg | INTRAMUSCULAR | Status: AC | PRN

## 2019-08-07 MED ORDER — PERFLUTREN LIPID MICROSPHERE
1.0000 mL | INTRAVENOUS | Status: AC | PRN
  Administered 2019-08-07: 2 mL via INTRAVENOUS
  Filled 2019-08-07: qty 10

## 2019-08-07 MED ORDER — ATORVASTATIN CALCIUM 80 MG PO TABS
80.0000 mg | ORAL_TABLET | Freq: Every day | ORAL | Status: DC
  Administered 2019-08-07 – 2019-08-16 (×10): 80 mg via ORAL
  Filled 2019-08-07 (×10): qty 1

## 2019-08-07 MED ORDER — ADULT MULTIVITAMIN W/MINERALS CH
1.0000 | ORAL_TABLET | Freq: Every day | ORAL | Status: DC
  Administered 2019-08-07 – 2019-08-16 (×10): 1 via ORAL
  Filled 2019-08-07 (×10): qty 1

## 2019-08-07 MED ORDER — FUROSEMIDE 10 MG/ML IJ SOLN
40.0000 mg | Freq: Two times a day (BID) | INTRAMUSCULAR | Status: DC
  Administered 2019-08-07 – 2019-08-09 (×5): 40 mg via INTRAVENOUS
  Filled 2019-08-07 (×5): qty 4

## 2019-08-07 MED ORDER — ACETAMINOPHEN 650 MG RE SUPP
650.0000 mg | Freq: Once | RECTAL | Status: AC
  Administered 2019-08-07: 650 mg via RECTAL
  Filled 2019-08-07: qty 1

## 2019-08-07 MED ORDER — SODIUM CHLORIDE 0.9% FLUSH
3.0000 mL | Freq: Two times a day (BID) | INTRAVENOUS | Status: DC
  Administered 2019-08-07 – 2019-08-16 (×19): 3 mL via INTRAVENOUS

## 2019-08-07 MED ORDER — PANTOPRAZOLE SODIUM 40 MG PO TBEC
40.0000 mg | DELAYED_RELEASE_TABLET | Freq: Every day | ORAL | Status: DC
  Administered 2019-08-07 – 2019-08-16 (×10): 40 mg via ORAL
  Filled 2019-08-07 (×10): qty 1

## 2019-08-07 MED ORDER — ONDANSETRON HCL 4 MG/2ML IJ SOLN: 4.0000 mg | Freq: Four times a day (QID) | INTRAMUSCULAR | Status: DC | PRN

## 2019-08-07 MED ORDER — IPRATROPIUM-ALBUTEROL 0.5-2.5 (3) MG/3ML IN SOLN: 3.0000 mL | Freq: Four times a day (QID) | RESPIRATORY_TRACT | Status: DC | PRN

## 2019-08-07 MED ORDER — SODIUM CHLORIDE 0.9 % IV SOLN
2.0000 g | INTRAVENOUS | Status: DC
  Administered 2019-08-07 – 2019-08-15 (×9): 2 g via INTRAVENOUS
  Filled 2019-08-07 (×5): qty 2
  Filled 2019-08-07 (×4): qty 20
  Filled 2019-08-07: qty 2

## 2019-08-07 MED ORDER — NICOTINE 21 MG/24HR TD PT24
21.0000 mg | MEDICATED_PATCH | Freq: Every day | TRANSDERMAL | Status: DC
  Administered 2019-08-07 – 2019-08-16 (×10): 21 mg via TRANSDERMAL
  Filled 2019-08-07 (×10): qty 1

## 2019-08-07 MED ORDER — GABAPENTIN 300 MG PO CAPS
300.0000 mg | ORAL_CAPSULE | Freq: Three times a day (TID) | ORAL | Status: DC
  Administered 2019-08-07 – 2019-08-16 (×28): 300 mg via ORAL
  Filled 2019-08-07 (×28): qty 1

## 2019-08-07 MED ORDER — LOSARTAN POTASSIUM 25 MG PO TABS: 25.0000 mg | ORAL_TABLET | Freq: Every day | ORAL | Status: DC

## 2019-08-07 MED ORDER — ACETAMINOPHEN 325 MG PO TABS
650.0000 mg | ORAL_TABLET | ORAL | Status: DC | PRN
  Administered 2019-08-13: 650 mg via ORAL
  Filled 2019-08-07 (×2): qty 2

## 2019-08-07 MED ORDER — THIAMINE HCL 100 MG PO TABS
100.0000 mg | ORAL_TABLET | Freq: Every day | ORAL | Status: DC
  Administered 2019-08-08 – 2019-08-16 (×9): 100 mg via ORAL
  Filled 2019-08-07 (×10): qty 1

## 2019-08-07 MED ORDER — LORAZEPAM 2 MG/ML IJ SOLN
0.0000 mg | Freq: Four times a day (QID) | INTRAMUSCULAR | Status: AC
  Administered 2019-08-07: 1 mg via INTRAVENOUS
  Administered 2019-08-08 (×2): 2 mg via INTRAVENOUS
  Filled 2019-08-07 (×3): qty 1

## 2019-08-07 MED ORDER — LORAZEPAM 2 MG/ML IJ SOLN
0.0000 mg | Freq: Two times a day (BID) | INTRAMUSCULAR | Status: AC
  Administered 2019-08-09 – 2019-08-10 (×2): 2 mg via INTRAVENOUS
  Filled 2019-08-07 (×2): qty 1

## 2019-08-07 MED ORDER — LORAZEPAM 1 MG PO TABS: 1.0000 mg | ORAL_TABLET | ORAL | Status: AC | PRN

## 2019-08-07 NOTE — ED Notes (Signed)
Breakfast Ordered 

## 2019-08-07 NOTE — ED Notes (Signed)
Respiratory has checked this pt and will follow

## 2019-08-07 NOTE — ED Notes (Signed)
Respiratory notified of the pts low sats

## 2019-08-07 NOTE — ED Triage Notes (Addendum)
Patient arrived with EMS from home on CPAP reports worsening SOB since yesterday , received Solumedrol 125 mg IV by EMS , Duoneb treatment and NTG sl x2 with improvement , placed on a BIPAP at arrival by RT .

## 2019-08-07 NOTE — ED Notes (Signed)
The pt   Has no complaints  He just wants to know when he will get a  room

## 2019-08-07 NOTE — ED Notes (Signed)
Echo in progress.

## 2019-08-07 NOTE — ED Notes (Signed)
Alert no pain  sats sitting at 88-91

## 2019-08-07 NOTE — Progress Notes (Signed)
RT note: RT removed patient from BIPAP and titrated O2 too HFNC 8L from sats of 93% or greater. Patient resting comfortable at this time. MD aware.

## 2019-08-07 NOTE — Consult Note (Addendum)
Cardiology Consultation:   Patient ID: Caleb [name]; [id number]; 1950/08/17   Admit date: 08/07/2019 Date of Consult: 08/07/2019  Primary Care Provider: Christain Sacramento, MD Primary Cardiologist: Dr. Johnsie Cancel, MD   Patient Profile:   Caleb [name] is a 69 y.o. 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 Tamala Julian.  History of Present Illness:   Caleb [name] is a 69yo M with a hx as stated above who presented to [facility] 08/07/19 with worsening SOB for the last 2 days. Reports he has had associated wheezing, intermittent diaphoresis, nausea, and vomiting which began earlier today. He denies recent illness or sick contacts. No loss of taste or smell. He tried using his albuterol inhaler without symptom relief. He continues to smoke approximately 1/2 PPD of cigarettes and drinks several beers per day as well.  On EMS arrival, pt was found to be hypoxic with O2 saturations in the mid 70's on RA. He was treated with CPAP en route and given Solumedrol IV, DuoNeb treatments and 2x SL NTG with moderate symptom relief.   On ED presentation, temp found to be 101.1 with a WBC at 16. BNP elevated at 1092 with a hsT at 68 and no anginal symptoms. CXR with bibasilar atelectasis and possible small right pleural effusion. COVID-19 negative. Empiric ABX with Rocephin and azithromycin inititaed. Echocardiogram performed which unfortunately shows new LV dysfunction with an EF at 45-50% with global hypokinesis and G2DD and no valvular disease. Previous echo from 07/31/2018 with normal EF.   He is followed by Dr. Johnsie Cancel for his cardiology care. He was last seen 04/10/2019 and seemed to be doing well from a CV standpoint. No changes were  made with plans to follow in one year.   On my exam, he reports symptom improvement although appears very ill on observation. He is currently off Bipap but remains borderline hypoxic on [place] 6L. He denies recent anginal symptoms including chest pain, dyspnea, orthopnea, worsening LE edema, recent dizziness or syncope.   Past Medical History:  Diagnosis Date  . Acute respiratory failure with hypoxia ([place])   . Acute systolic congestive heart failure ([place]) 02/11/2014  . COPD (chronic obstructive pulmonary disease) ([place]) 02/11/2014  . Essential hypertension   . Hypercholesteremia   . 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 Tobacco abuse     Past Surgical History:  Procedure Laterality Date  . BIOPSY  10/31/2018   Procedure: BIOPSY;  Surgeon: Carol Ada, MD;  Location: [facility];  Service: Endoscopy;;  . cataract surgery  Bilateral 12-2017 and 01-2018  . COLONOSCOPY WITH PROPOFOL N/A 10/31/2018   Procedure: COLONOSCOPY WITH PROPOFOL;  Surgeon: Carol Ada, MD;  Location: [facility];  Service: Endoscopy;  Laterality: N/A;  . CORONARY ARTERY BYPASS GRAFT N/A 02/17/2014   Procedure: CORONARY ARTERY BYPASS GRAFTING (CABG);  Surgeon: Rexene Alberts, MD;  Location: [place];  Service: Open Heart Surgery;  Laterality: N/A;  Times 3 using bilateral mammary arteries and endoscopically harvested right saphenous vein  . ESOPHAGOGASTRODUODENOSCOPY (EGD) WITH PROPOFOL N/A 10/31/2018   Procedure: ESOPHAGOGASTRODUODENOSCOPY (EGD) WITH PROPOFOL;  Surgeon: Benson Norway,  Saralyn Pilar, MD;  Location: [facility];  Service: Endoscopy;  Laterality: N/A;  . FEMORAL ARTERY - FEMORAL ARTERY BYPASS GRAFT      right femoral artery to below-knee popliteal artery bypass with PTFE and right first ray amputation 04/25/17  . HEMOSTASIS CLIP PLACEMENT  10/31/2018   Procedure: HEMOSTASIS CLIP PLACEMENT;  Surgeon: Carol Ada, MD;  Location: [facility];  Service: Endoscopy;;  .  INTRAOPERATIVE TRANSESOPHAGEAL ECHOCARDIOGRAM N/A 02/17/2014   Procedure: INTRAOPERATIVE TRANSESOPHAGEAL ECHOCARDIOGRAM;  Surgeon: Rexene Alberts, MD;  Location: [place];  Service: Open Heart Surgery;  Laterality: N/A;  . LEFT HEART CATHETERIZATION WITH CORONARY ANGIOGRAM N/A 02/12/2014   Procedure: LEFT HEART CATHETERIZATION WITH CORONARY ANGIOGRAM;  Surgeon: Sinclair Grooms, MD;  Location: [facility] CATH LAB;  Service: Cardiovascular;  Laterality: N/A;  . POLYPECTOMY  10/31/2018   Procedure: POLYPECTOMY;  Surgeon: Carol Ada, MD;  Location: [facility];  Service: Endoscopy;;  . SUBMUCOSAL LIFTING INJECTION  10/31/2018   Procedure: SUBMUCOSAL LIFTING INJECTION;  Surgeon: Carol Ada, MD;  Location: [facility];  Service: Endoscopy;;  . TOE AMPUTATION Right    right great toe      Prior to Admission medications   Medication Sig Start Date End Date Taking? Authorizing Provider  acetaminophen (TYLENOL) 500 MG tablet Take 500 mg by mouth 2 (two) times daily as needed for mild pain or moderate pain.    Yes [provider]  albuterol (VENTOLIN HFA) 108 (90 Base) MCG/ACT inhaler Inhale 2 puffs into the lungs every 6 (six) hours as needed for wheezing or shortness of breath. 07/24/18  Yes Olalere, Adewale A, MD  amLODipine (NORVASC) 10 MG tablet Take 1 tablet (10 mg total) by mouth daily. 03/11/17  Yes Josue Hector, MD  atorvastatin (LIPITOR) 80 MG tablet Take 1 tablet (80 mg total) by mouth daily. 12/14/15  Yes Josue Hector, MD  carvedilol (COREG) 12.5 MG tablet Take 1 tablet (12.5 mg total) by mouth 2 (two) times daily. 12/27/15  Yes Josue Hector, MD  Cholecalciferol (VITAMIN D-3 PO) Take 1 capsule by mouth daily with breakfast.   Yes [provider]  gabapentin (NEURONTIN) 300 MG capsule Take 300 mg by mouth 3 (three) times daily.   Yes [provider]  nitroGLYCERIN (NITROSTAT) 0.4 MG SL tablet Place 1 tablet (0.4 mg total) under the tongue every 5 (five) minutes as  needed for chest pain. 02/03/19 08/06/20 Yes Kilroy, Doreene Burke, PA-C  vitamin B-12 (CYANOCOBALAMIN) 1000 MCG tablet Take 1,000 mcg by mouth daily with breakfast.   Yes [provider]  XARELTO 20 MG TABS tablet Take 20 mg by mouth daily before breakfast. 06/13/18  Yes [provider]    Inpatient Medications: Scheduled Meds: . amLODipine  10 mg Oral Daily  . atorvastatin  80 mg Oral Daily  . carvedilol  12.5 mg Oral BID  . folic acid  1 mg Oral Daily  . furosemide  40 mg Intravenous BID  . gabapentin  300 mg Oral TID  . LORazepam  0-4 mg Intravenous Q6H   Followed by  . [START ON 08/09/2019] LORazepam  0-4 mg Intravenous Q12H  . multivitamin with minerals  1 tablet Oral Daily  . nicotine  21 mg Transdermal Daily  . pantoprazole  40 mg Oral Daily  . rivaroxaban  20 mg Oral Q supper  . sodium chloride flush  3 mL Intravenous Q12H  . thiamine  100 mg Oral Daily   Or  . thiamine  100 mg  Intravenous Daily   Continuous Infusions: . sodium chloride    . azithromycin Stopped (08/07/19 0726)  . cefTRIAXone (ROCEPHIN)  IV Stopped (08/07/19 0646)   PRN Meds: sodium chloride, acetaminophen, ipratropium-albuterol, LORazepam **OR** LORazepam, ondansetron (ZOFRAN) IV, sodium chloride flush  Allergies:    Allergies  Allergen Reactions  . Sulfamethoxazole-Trimethoprim Other (See Comments)    "Made potassium go high and sodium go low"  . Cilostazol Swelling    Site of swelling not recalled by the patient  . Lisinopril Cough    Social History:   Social History   Socioeconomic History  . Marital status: Single    Spouse name: Not on file  . Number of children: Not on file  . Years of education: Not on file  . Highest education level: Not on file  Occupational History  . Not on file  Tobacco Use  . Smoking status: Current Every Day Smoker    Packs/day: 0.50    Years: 35.00    Pack years: 17.50    Types: Cigarettes    Last attempt to quit: 02/11/2014    Years since  quitting: 5.4  . Smokeless tobacco: Never Used  . Tobacco comment: has quit twice before   Vaping Use  . Vaping Use: Never used  Substance and Sexual Activity  . Alcohol use: Yes    Alcohol/week: 0.0 standard drinks    Comment: Six pack a day of beer  . Drug use: Not on file  . Sexual activity: Not on file  Other Topics Concern  . Not on file  Social History Narrative  . Not on file   Social Determinants of Health   Financial Resource Strain:   . Difficulty of Paying Living Expenses:   Food Insecurity:   . Worried About Charity fundraiser in the Last Year:   . Arboriculturist in the Last Year:   Transportation Needs:   . Film/video editor (Medical):   [name] Lack of Transportation (Non-Medical):   Physical Activity:   . Days of Exercise per Week:   . Minutes of Exercise per Session:   Stress:   . Feeling of Stress :   Social Connections:   . Frequency of Communication with Friends and Family:   . Frequency of Social Gatherings with Friends and Family:   . Attends Religious Services:   . Active Member of Clubs or Organizations:   . Attends Archivist Meetings:   [name] Marital Status:   Intimate Partner Violence:   . Fear of Current or Ex-Partner:   . Emotionally Abused:   [name] Physically Abused:   . Sexually Abused:     Family History:   Family History  Problem Relation Age of Onset  . Heart attack Mother   . Diabetes Mother   . Heart attack Brother   . Diabetes Brother   . Cancer Sister   . Stroke Neg Hx    Family Status:  Family Status  Relation Name Status  . Mother  Deceased  . Father  Deceased  . Brother  (Not Specified)  . Brother  (Not Specified)  . Sister  (Not Specified)  . Neg Hx  (Not Specified)    ROS:  Please see the history of present illness.  All other ROS reviewed and negative.     Physical Exam/Data:   Vitals:   08/07/19 0945 08/07/19 1016 08/07/19 1024 08/07/19 1400  BP: (!) 147/60 (!) 144/87 (!) 144/87 (!) 141/60  Pulse:  74  76 76 68  Resp: 18  20 18   Temp:   97.7 F (36.5 C)   TempSrc:   Oral   SpO2: 93%  94% 96%  Weight:      Height:        Intake/Output Summary (Last 24 hours) at 08/07/2019 1414 Last data filed at 08/07/2019 1312 Gross per 24 hour  Intake --  Output 1475 ml  Net -1475 ml   Filed Weights   08/07/19 0504  Weight: 75 kg   Body mass index is 25.9 kg/m.   General: Ill appearing, NAD Neck: Negative for carotid bruits. No JVD Lungs: Diminished in bilateral upper and lower lobes. No wheezes, rales, or rhonchi. Breathing is unlabored. Requiring 6L [place]  Cardiovascular: RRR with S1 S2. No murmurs Abdomen: Soft, non-tender, non-distended. No obvious abdominal masses. Extremities: No edema. Radial pulses 2+ bilaterally Neuro: Alert and oriented. No focal deficits. No facial asymmetry. MAE spontaneously. Psych: Responds to questions appropriately with normal affect.    EKG:  The EKG was personally reviewed and demonstrates: 08/07/19 NSR with HR 91bpm and no acute ST changes  Telemetry:  Telemetry was personally reviewed and demonstrates: 08/07/19 NSR with HR in the 80-90  Relevant CV Studies:  ECHO: 08/07/19:  1. Left ventricular ejection fraction, by estimation, is 45 to 50%. The  left ventricle has mildly decreased function. The left ventricle  demonstrates global hypokinesis. Left ventricular diastolic parameters are  consistent with Grade II diastolic  dysfunction (pseudonormalization). Elevated left ventricular end-diastolic  pressure.  2. Right ventricular systolic function is moderately reduced. The right  ventricular size is normal. There is normal pulmonary artery systolic  pressure.  3. Left atrial size was mild to moderately dilated.  4. The mitral valve is normal in structure. Mild mitral valve  regurgitation. No evidence of mitral stenosis.  5. Tricuspid valve regurgitation is mild to moderate.  6. The aortic valve is tricuspid. Aortic valve regurgitation is not   visualized. No aortic stenosis is present.  7. The inferior vena cava is normal in size with greater than 50%  respiratory variability, suggesting right atrial pressure of 3 mmHg.   ECHO 07/31/2018:  1. The left ventricle has normal systolic function with an ejection  fraction of 60-65%. The cavity size was normal. There is mildly increased  left ventricular wall thickness. Left ventricular diastolic parameters  were normal.  2. Global longitudinal strain is -20.2%.  3. The right ventricle has normal systolic function. The cavity was  normal. There is no increase in right ventricular wall thickness.  4. The aortic root is normal in size and structure.   Laboratory Data:  Chemistry Recent Labs  Lab 08/07/19 0513 08/07/19 0518 08/07/19 0901  NA 124* 124* 125*  K 4.0 4.0 3.5  CL  --  92* 92*  CO2  --  21* 21*  GLUCOSE  --  179* 144*  BUN  --  6* 8  CREATININE  --  0.85 0.81  CALCIUM  --  8.8* 9.0  GFRNONAA  --  >60 >60  GFRAA  --  >60 >60  ANIONGAP  --  11 12    Total Protein  Date Value Ref Range Status  08/07/2019 6.4 (L) 6.5 - 8.1 g/dL Final   Albumin  Date Value Ref Range Status  08/07/2019 3.0 (L) 3.5 - 5.0 g/dL Final   AST  Date Value Ref Range Status  08/07/2019 24 15 - 41 U/L Final   ALT  Date Value Ref  Range Status  08/07/2019 11 0 - 44 U/L Final   Alkaline Phosphatase  Date Value Ref Range Status  08/07/2019 53 38 - 126 U/L Final   Total Bilirubin  Date Value Ref Range Status  08/07/2019 0.8 0.3 - 1.2 mg/dL Final   Hematology Recent Labs  Lab 08/07/19 0513 08/07/19 0518 08/07/19 0901  WBC  --  16.0*  --   RBC  --  3.19*  --   HGB 8.5* 7.6* 7.8*  HCT 25.0* 25.2* 26.4*  MCV  --  79.0*  --   MCH  --  23.8*  --   MCHC  --  30.2  --   RDW  --  16.8*  --   PLT  --  228  --    Cardiac EnzymesNo results for input(s): TROPONINI in the last 168 hours. No results for input(s): TROPIPOC in the last 168 hours.  BNP Recent Labs  Lab  08/07/19 0519  BNP 1,092.0*    DDimer No results for input(s): DDIMER in the last 168 hours. TSH:  Lab Results  Component Value Date   TSH 1.419 08/13/2018   Lipids: Lab Results  Component Value Date   CHOL 176 12/27/2015   HDL 52 12/27/2015   LDLCALC 75 12/27/2015   TRIG 247 (H) 12/27/2015   CHOLHDL 3.4 12/27/2015   HgbA1c: Lab Results  Component Value Date   HGBA1C 5.2 02/14/2014    Radiology/Studies:  [facility] Chest Port 1 View  Result Date: 08/07/2019 CLINICAL DATA:  Worsening shortness of breath since yesterday. EXAM: PORTABLE CHEST 1 VIEW COMPARISON:  Chest x-ray 03/11/2017 FINDINGS: Stable surgical changes from bypass surgery. Mild stable cardiac enlargement. Stable tortuosity and calcification of the thoracic aorta. Mild central vascular congestion but no pulmonary edema. Low lung volumes with vascular crowding and streaky basilar atelectasis. Possible small right effusion. The bony thorax is intact. Remote healed bilateral rib fractures are noted. IMPRESSION: 1. Low lung volumes with vascular crowding and streaky basilar atelectasis. 2. Possible small right effusion. Electronically Signed   By: Marijo Sanes M.D.   On: 08/07/2019 06:34   ECHOCARDIOGRAM COMPLETE  Result Date: 08/07/2019    ECHOCARDIOGRAM REPORT   Patient Name:   Caleb [name] Date of Exam: 08/07/2019 Medical Rec #:  [id number]   Height:       67.0 in Accession #:    [id number]  Weight:       165.3 lb Date of Birth:  October 25, 1950   BSA:          1.865 m Patient Age:    29 years    BP:           144/87 mmHg Patient Gender: M           HR:           76 bpm. Exam Location:  Inpatient Procedure: 2D Echo and Intracardiac Opacification Agent Indications:    CHF-Acute Diastolic 314.97 / W26.37  History:        Patient has prior history of Echocardiogram examinations, most                 recent 07/31/2018. CAD, Prior CABG, PAD and COPD; Risk                 Factors:Current Smoker, Hypertension and Dyslipidemia.  Sonographer:     Darlina Sicilian RDCS Referring Phys: [id number] [place]  1. Left ventricular ejection fraction, by estimation, is 45 to 50%. The left ventricle  has mildly decreased function. The left ventricle demonstrates global hypokinesis. Left ventricular diastolic parameters are consistent with Grade II diastolic dysfunction (pseudonormalization). Elevated left ventricular end-diastolic pressure.  2. Right ventricular systolic function is moderately reduced. The right ventricular size is normal. There is normal pulmonary artery systolic pressure.  3. Left atrial size was mild to moderately dilated.  4. The mitral valve is normal in structure. Mild mitral valve regurgitation. No evidence of mitral stenosis.  5. Tricuspid valve regurgitation is mild to moderate.  6. The aortic valve is tricuspid. Aortic valve regurgitation is not visualized. No aortic stenosis is present.  7. The inferior vena cava is normal in size with greater than 50% respiratory variability, suggesting right atrial pressure of 3 mmHg. Comparison(s): Changes from prior study are noted. Compared to prior study, mildly reduced LV and moderately reduced RV function without focal wall motion abnormalities. FINDINGS  Left Ventricle: Left ventricular ejection fraction, by estimation, is 45 to 50%. The left ventricle has mildly decreased function. The left ventricle demonstrates global hypokinesis. Definity contrast agent was given IV to delineate the left ventricular  endocardial borders. The left ventricular internal cavity size was normal in size. There is no left ventricular hypertrophy. Left ventricular diastolic parameters are consistent with Grade II diastolic dysfunction (pseudonormalization). Elevated left ventricular end-diastolic pressure. Right Ventricle: The right ventricular size is normal. Right vetricular wall thickness was not assessed. Right ventricular systolic function is moderately reduced. There is normal pulmonary artery  systolic pressure. The tricuspid regurgitant velocity is 2.87 m/s, and with an assumed right atrial pressure of 3 mmHg, the estimated right ventricular systolic pressure is 86.5 mmHg. Left Atrium: Left atrial size was mild to moderately dilated. Right Atrium: Right atrial size was normal in size. Pericardium: There is no evidence of pericardial effusion. Mitral Valve: The mitral valve is normal in structure. Mild mitral valve regurgitation. No evidence of mitral valve stenosis. Tricuspid Valve: The tricuspid valve is normal in structure. Tricuspid valve regurgitation is mild to moderate. No evidence of tricuspid stenosis. Aortic Valve: The aortic valve is tricuspid. Aortic valve regurgitation is not visualized. No aortic stenosis is present. Pulmonic Valve: The pulmonic valve was not well visualized. Pulmonic valve regurgitation is trivial. No evidence of pulmonic stenosis. Aorta: The aortic root, ascending aorta and aortic arch are all structurally normal, with no evidence of dilitation or obstruction. Venous: The inferior vena cava is normal in size with greater than 50% respiratory variability, suggesting right atrial pressure of 3 mmHg. IAS/Shunts: The atrial septum is grossly normal.  LEFT VENTRICLE PLAX 2D LVIDd:         5.03 cm      Diastology LVIDs:         3.99 cm      LV e' lateral:   5.51 cm/s LV PW:         0.93 cm      LV E/e' lateral: 21.1 LV IVS:        1.06 cm      LV e' medial:    2.59 cm/s LVOT diam:     2.00 cm      LV E/e' medial:  44.8 LV SV:         67 LV SV Index:   36 LVOT Area:     3.14 cm  LV Volumes (MOD) LV vol d, MOD A2C: 130.0 ml LV vol d, MOD A4C: 123.0 ml LV vol s, MOD A2C: 72.5 ml LV vol s, MOD A4C: 65.4 ml LV  SV MOD A2C:     57.5 ml LV SV MOD A4C:     123.0 ml LV SV MOD BP:      59.5 ml RIGHT VENTRICLE TAPSE (M-mode): 1.2 cm LEFT ATRIUM             Index       RIGHT ATRIUM           Index LA diam:        3.80 cm 2.04 cm/m  RA Area:     14.60 cm LA Vol (A2C):   74.3 ml 39.84 ml/m  RA Volume:   36.00 ml  19.30 ml/m LA Vol (A4C):   44.9 ml 24.07 ml/m LA Biplane Vol: 58.9 ml 31.58 ml/m  AORTIC VALVE LVOT Vmax:   84.90 cm/s LVOT Vmean:  59.400 cm/s LVOT VTI:    0.213 m  AORTA Ao Root diam: 3.30 cm MITRAL VALVE                TRICUSPID VALVE MV Area (PHT): 7.16 cm     TR Peak grad:   32.9 mmHg MV Decel Time: 106 msec     TR Vmax:        287.00 cm/s MV E velocity: 116.00 cm/s MV A velocity: 72.80 cm/s   SHUNTS MV E/A ratio:  1.59         Systemic VTI:  0.21 m                             Systemic Diam: 2.00 cm Buford Dresser MD Electronically signed by Buford Dresser MD Signature Date/Time: 08/07/2019/1:43:58 PM    Final    Assessment and Plan:   1. Acute systolic CHF exacerbation: -Pt presented with worsening dyspnea with acute hypoxic respiratory failure found to have an elevated BNP and CXR consistent with CHF however also with fever, hypoxia and cough. Respiratory and COVID panels were found to be negative. Echocardiogram performed 08/07/19 with new LV dysfunction and global hypokinesis and G2DD with no valvular disease -Pt has prior hx of CABG although denies recent anginal symptoms. 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  -Plan to continue with diuresis for now and follow response.  -Will place on GDMT for LV dysfunction. Plan to follow while hospitalized and will repeat echo after pt optimized on therapy. Will also plan for OP ischemic evaluation given hx of CAD with CABG with stress test to ensure no change.  -Weight, 165lb  -I&O, net negative 1.4L -IV Lasix 40mg  BID initiated per primary team>>continue  -Continue carvedilol>>may need to up-titrate given elevated BP with discontinuation of amlodipine with LV dysfunction   2. Acute SIRS/acute hypoxic respiratory  failure: -On presentation, temp elevated at 101.1 with WBC at 16 and LA at 1.9>>COVID screen negative.  -CXR with bibasilar atelectasis.  -Respiratory panel obtained -Started on abx with Rocephin and azithromycin  -Management per primary team   3. HTN: -Elevated, 147/58>141/60>144/87 -PTA amlodipine 10, carvedilol 12.5 -Stop amlodipine and start losartan 25  4. COPD/tobacco use: -Given Solumedrol 125mg  along with DuoNeb -Current everyday tobacco use, 1/2 PPD -Management per primary team    5. PVD: -On chronic Xarelto with hx of prior digit amputation  -No reports of bleeding in stool or urine   6. HLD: -Last  LDL, 75 from 12/27/2015 -Repeat lipid panel while hospitalized  -Continue high intensity statin    For questions or updates, please contact [facility] Please consult [email or web address] for contact info under Cardiology/STEMI.   SignedKathyrn Drown NP-C [place] Pager: [phone] 08/07/2019 2:14 PM

## 2019-08-07 NOTE — H&P (Addendum)
History and Physical    Caleb [name] PZW:[id number] DOB: 1950-02-29 DOA: 08/07/2019  Referring MD/NP/PA: Addison Lank, MD PCP: Christain Sacramento, MD  Patient coming from: Home via EMS  Chief Complaint: "Couldn't breath"  I have personally briefly reviewed patient's old medical records in [place]   HPI: Caleb [name] is a 69 y.o. male with medical history significant of hypertension, hyperlipidemia, CHF, PVD, CAD s/p CABG, COPD, and tobacco abuse presents with complaints of difficulty breathing for last 2 days. He complains of having a intermittent productive cough with clear sputum. Patient reported that he has airconditioning that was working properly, but complained of being hot. Associated symptoms of wheezing, intermittent sweats, nausea with couple episodes of nonbloody emesis, and generalized malaise. He notes that he chronically has leg swelling worse on the right than on the left as a result of vessels removed for CABG. Denies having any fever, chest pain, dysuria, recent sick contacts, change in taste/smell, diarrhea, blood in stools, NSAID use, or change in weight. At home he had tried utilizing his albuterol inhaler, but it did not seem to be working properly. He has not had any of the COVID-19 vaccines. He admits to smoking half pack cigarettes so per day on average and drinks 412 ounce beers per day on average.  En route with EMS patient was reported to have O2 saturations into the 70s on room air for which patient was placed on CPAP.  He received Solu-Medrol 125 mg IV, DuoNeb, and 2 sublingual nitroglycerin with improvement in symptoms.    ED Course: Upon admission into the emergency department patient was found to be febrile up to 101.1 F, respiration 19-22, and initially placed on BiPAP to maintain O2 saturation.  ABG revealed pH 7.416, PCO2 36 point, PO2 287 on BiPAP labs significant for WBC 16, hemoglobin 7.6 with low MCV/MCH, hemoglobin 7.6, sodium 124, glucose 179, INR 2,  BNP 1092.8, and troponin 68.  Chest x-ray was noted to have low lung volumes with bibasilar atelectasis and possible small right pleural effusion.  COVID-19 screening was negative.  Sepsis protocol has been initiated without fluid bolus and patient was given empiric antibiotics of Rocephin and azithromycin as there was concern for respiratory source.  Patient was able to be weaned off BiPAP to nasal cannula oxygen.   Review of Systems  Constitutional: Positive for diaphoresis and malaise/fatigue. Negative for fever.  HENT: Negative for ear discharge and nosebleeds.   Eyes: Negative for photophobia and pain.  Respiratory: Positive for cough, sputum production and shortness of breath.   Cardiovascular: Positive for leg swelling. Negative for chest pain.  Gastrointestinal: Positive for nausea and vomiting. Negative for abdominal pain and blood in stool.  Genitourinary: Negative for dysuria and hematuria.  Musculoskeletal: Negative for falls.  Skin: Negative for rash.  Neurological: Negative for focal weakness and loss of consciousness.  Endo/Heme/Allergies: Negative for polydipsia.  Psychiatric/Behavioral: Positive for substance abuse. Negative for memory loss.    Past Medical History:  Diagnosis Date  . Acute respiratory failure with hypoxia ([place])   . Acute systolic congestive heart failure ([place]) 02/11/2014  . COPD (chronic obstructive pulmonary disease) ([place]) 02/11/2014  . Essential hypertension   . Hypercholesteremia   . 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 Tobacco abuse     Past Surgical History:  Procedure Laterality Date  . BIOPSY  10/31/2018   Procedure: BIOPSY;  Surgeon:  Carol Ada, MD;  Location: [facility];  Service: Endoscopy;;  . cataract surgery  Bilateral 12-2017 and 01-2018  . COLONOSCOPY WITH PROPOFOL N/A 10/31/2018   Procedure: COLONOSCOPY WITH PROPOFOL;  Surgeon: Carol Ada, MD;  Location: [facility];  Service:  Endoscopy;  Laterality: N/A;  . CORONARY ARTERY BYPASS GRAFT N/A 02/17/2014   Procedure: CORONARY ARTERY BYPASS GRAFTING (CABG);  Surgeon: Rexene Alberts, MD;  Location: [facility];  Service: Open Heart Surgery;  Laterality: N/A;  Times 3 using bilateral mammary arteries and endoscopically harvested right saphenous vein  . ESOPHAGOGASTRODUODENOSCOPY (EGD) WITH PROPOFOL N/A 10/31/2018   Procedure: ESOPHAGOGASTRODUODENOSCOPY (EGD) WITH PROPOFOL;  Surgeon: Carol Ada, MD;  Location: [facility];  Service: Endoscopy;  Laterality: N/A;  . FEMORAL ARTERY - FEMORAL ARTERY BYPASS GRAFT      right femoral artery to below-knee popliteal artery bypass with PTFE and right first ray amputation 04/25/17  . HEMOSTASIS CLIP PLACEMENT  10/31/2018   Procedure: HEMOSTASIS CLIP PLACEMENT;  Surgeon: Carol Ada, MD;  Location: [facility];  Service: Endoscopy;;  . INTRAOPERATIVE TRANSESOPHAGEAL ECHOCARDIOGRAM N/A 02/17/2014   Procedure: INTRAOPERATIVE TRANSESOPHAGEAL ECHOCARDIOGRAM;  Surgeon: Rexene Alberts, MD;  Location: [facility];  Service: Open Heart Surgery;  Laterality: N/A;  . LEFT HEART CATHETERIZATION WITH CORONARY ANGIOGRAM N/A 02/12/2014   Procedure: LEFT HEART CATHETERIZATION WITH CORONARY ANGIOGRAM;  Surgeon: Sinclair Grooms, MD;  Location: [facility];  Service: Cardiovascular;  Laterality: N/A;  . POLYPECTOMY  10/31/2018   Procedure: POLYPECTOMY;  Surgeon: Carol Ada, MD;  Location: [facility];  Service: Endoscopy;;  . SUBMUCOSAL LIFTING INJECTION  10/31/2018   Procedure: SUBMUCOSAL LIFTING INJECTION;  Surgeon: Carol Ada, MD;  Location: [facility];  Service: Endoscopy;;  . TOE AMPUTATION Right    right great toe      reports that he has been smoking cigarettes. He has a 17.50 pack-year smoking history. He has never used smokeless tobacco. He reports current alcohol use. No history on file for drug use.  Allergies  Allergen Reactions  . Sulfamethoxazole-Trimethoprim Other (See Comments)     "Made potassium go high and sodium go low"  . Cilostazol Swelling    Site of swelling not recalled by the patient  . Lisinopril Cough    Family History  Problem Relation Age of Onset  . Heart attack Mother   . Diabetes Mother   . Heart attack Brother   . Diabetes Brother   . Cancer Sister   . Stroke Neg Hx     Prior to Admission medications   Medication Sig Start Date End Date Taking? Authorizing Provider  acetaminophen (TYLENOL) 500 MG tablet Take 500 mg by mouth 2 (two) times a day.     [provider]  albuterol (VENTOLIN HFA) 108 (90 Base) MCG/ACT inhaler Inhale 2 puffs into the lungs every 6 (six) hours as needed for wheezing or shortness of breath. 07/24/18   Olalere, Cicero Duck A, MD  amLODipine (NORVASC) 10 MG tablet Take 1 tablet (10 mg total) by mouth daily. 03/11/17   Josue Hector, MD  atorvastatin (LIPITOR) 80 MG tablet Take 1 tablet (80 mg total) by mouth daily. 12/14/15   Josue Hector, MD  carvedilol (COREG) 12.5 MG tablet Take 1 tablet (12.5 mg total) by mouth 2 (two) times daily. 12/27/15   Josue Hector, MD  Cholecalciferol (VITAMIN D-3 PO) Take 1 capsule by mouth daily with breakfast.    [provider]  gabapentin (NEURONTIN) 300 MG capsule Take 300 mg  by mouth 3 (three) times daily.    [provider]  nitroGLYCERIN (NITROSTAT) 0.4 MG SL tablet Place 1 tablet (0.4 mg total) under the tongue every 5 (five) minutes as needed for chest pain. 02/03/19 05/04/19  Erlene Quan, PA-C  vitamin B-12 (CYANOCOBALAMIN) 1000 MCG tablet Take 1,000 mcg by mouth daily with breakfast.    [provider]  XARELTO 20 MG TABS tablet Take 20 mg by mouth daily before breakfast. 06/13/18   [provider]    Physical Exam:  Constitutional: Elderly male who appears acutely sick, but in no acute distress at this time Vitals:   08/07/19 0509 08/07/19 0530 08/07/19 0545 08/07/19 0630  BP:  (!) 124/53  (!) 115/53  Pulse: 94 75  75  Resp: 22 19   20   Temp: 99.5 F (37.5 C)  (!) 101.1 F (38.4 C)   TempSrc: Axillary  Rectal   SpO2: 100% 96%  91%  Weight:      Height:       Eyes: PERRL, lids and conjunctivae normal ENMT: Mucous membranes are dry. Posterior pharynx clear of any exudate or lesions.  Neck: normal, supple, no masses, no thyromegaly Respiratory: Decreased aeration without any acute wheezes appreciated. Patient currently on 8 L of high flow nasal cannula oxygen Cardiovascular: Regular rate and rhythm, no murmurs / rubs / gallops. Median sternotomy scar present. 2+ pitting edema noted on the right lower extremity and 1+ pitting edema on the left lower extremity Abdomen: Soft, no tenderness palpation, and bowel sounds present. Musculoskeletal: no clubbing / cyanosis. Amputation of the right first digit. Skin: diaphoretic. Healed scar of the right leg. Neurologic: CN 2-12 grossly intact. Sensation intact, DTR normal. Strength 5/5 in all 4.  Psychiatric: Normal judgment and insight. Lethargic, but oriented x 3. Normal mood.     Labs on Admission: I have personally reviewed following labs and imaging studies  CBC: Recent Labs  Lab 08/07/19 0513 08/07/19 0518  WBC  --  16.0*  NEUTROABS  --  14.1*  HGB 8.5* 7.6*  HCT 25.0* 25.2*  MCV  --  79.0*  PLT  --  371   Basic Metabolic Panel: Recent Labs  Lab 08/07/19 0513 08/07/19 0518  NA 124* 124*  K 4.0 4.0  CL  --  92*  CO2  --  21*  GLUCOSE  --  179*  BUN  --  6*  CREATININE  --  0.85  CALCIUM  --  8.8*   GFR: Estimated Creatinine Clearance: 75.6 mL/min (by C-G formula based on SCr of 0.85 mg/dL). Liver Function Tests: Recent Labs  Lab 08/07/19 0518  AST 24  ALT 11  ALKPHOS 53  BILITOT 0.8  PROT 6.4*  ALBUMIN 3.0*   No results for input(s): LIPASE, AMYLASE in the last 168 hours. No results for input(s): AMMONIA in the last 168 hours. Coagulation Profile: Recent Labs  Lab 08/07/19 0630  INR 2.0*   Cardiac Enzymes: No results for input(s):  CKTOTAL, CKMB, CKMBINDEX, TROPONINI in the last 168 hours. BNP (last 3 results) No results for input(s): PROBNP in the last 8760 hours. HbA1C: No results for input(s): HGBA1C in the last 72 hours. CBG: No results for input(s): GLUCAP in the last 168 hours. Lipid Profile: No results for input(s): CHOL, HDL, LDLCALC, TRIG, CHOLHDL, LDLDIRECT in the last 72 hours. Thyroid Function Tests: No results for input(s): TSH, T4TOTAL, FREET4, T3FREE, THYROIDAB in the last 72 hours. Anemia Panel: No results for input(s): VITAMINB12, FOLATE,  FERRITIN, TIBC, IRON, RETICCTPCT in the last 72 hours. Urine analysis:    Component Value Date/Time   COLORURINE STRAW (A) 08/13/2018 1543   APPEARANCEUR CLEAR 08/13/2018 1543   LABSPEC 1.002 (L) 08/13/2018 1543   PHURINE 7.0 08/13/2018 1543   GLUCOSEU NEGATIVE 08/13/2018 1543   HGBUR NEGATIVE 08/13/2018 1543   BILIRUBINUR NEGATIVE 08/13/2018 1543   KETONESUR NEGATIVE 08/13/2018 1543   PROTEINUR NEGATIVE 08/13/2018 1543   UROBILINOGEN 0.2 02/13/2014 1645   NITRITE NEGATIVE 08/13/2018 1543   LEUKOCYTESUR NEGATIVE 08/13/2018 1543   Sepsis Labs: Recent Results (from the past 240 hour(s))  SARS Coronavirus 2 by RT PCR (hospital order, performed in [facility] hospital lab) Nasopharyngeal Nasopharyngeal Swab     Status: None   Collection Time: 08/07/19  5:06 AM   Specimen: Nasopharyngeal Swab  Result Value Ref Range Status   SARS Coronavirus 2 NEGATIVE NEGATIVE Final    Comment: (NOTE) SARS-CoV-2 target nucleic acids are NOT DETECTED.  The SARS-CoV-2 RNA is generally detectable in upper and lower respiratory specimens during the acute phase of infection. The lowest concentration of SARS-CoV-2 viral copies this assay can detect is 250 copies / mL. A negative result does not preclude SARS-CoV-2 infection and should not be used as the sole basis for treatment or other patient management decisions.  A negative result may occur with improper specimen  collection / handling, submission of specimen other than nasopharyngeal swab, presence of viral mutation(s) within the areas targeted by this assay, and inadequate number of viral copies (<250 copies / mL). A negative result must be combined with clinical observations, patient history, and epidemiological information.  Fact Sheet for Patients:   StrictlyIdeas.no  Fact Sheet for Healthcare Providers: BankingDealers.co.za  This test is not yet approved or  cleared by the [place] FDA and has been authorized for detection and/or diagnosis of SARS-CoV-2 by FDA under an Emergency Use Authorization (EUA).  This EUA will remain in effect (meaning this test can be used) for the duration of the COVID-19 declaration under Section 564(b)(1) of the Act, 21 U.S.C. section 360bbb-3(b)(1), unless the authorization is terminated or revoked sooner.  Performed at [facility], [place] [address], [place], [place] [address]      Radiological Exams on Admission: DG Chest Port 1 View  Result Date: 08/07/2019 CLINICAL DATA:  Worsening shortness of breath since yesterday. EXAM: PORTABLE CHEST 1 VIEW COMPARISON:  Chest x-ray 03/11/2017 FINDINGS: Stable surgical changes from bypass surgery. Mild stable cardiac enlargement. Stable tortuosity and calcification of the thoracic aorta. Mild central vascular congestion but no pulmonary edema. Low lung volumes with vascular crowding and streaky basilar atelectasis. Possible small right effusion. The bony thorax is intact. Remote healed bilateral rib fractures are noted. IMPRESSION: 1. Low lung volumes with vascular crowding and streaky basilar atelectasis. 2. Possible small right effusion. Electronically Signed   By: Marijo Sanes M.D.   On: 08/07/2019 06:34    EKG: Independently reviewed. Sinus rhythm at 91 bpm with borderline ST depressions  Assessment/Plan SIRS: Acute.  Patient presented with complaints of  feeling hot with sweats and intermittent cough. Fever up to 101.1 F and WBC 16.  Lactic acid was noted to be reassuring at 1.9.  COVID-19 screening was negative. Chest x-ray noted vascular crowding and streaky bibasilar atelectasis.  Urinalysis was still pending.  Sepsis protocol has been initiated with empiric antibiotics of Rocephin and azithromycin.  -Admit to a progressive bed -Follow-up panculture -Check respiratory virus panel  -Continue empiric antibiotics of  Rocephin and azithromycin  Acute respiratory failure with hypoxia: Patient presented and was initially noted to be hypoxic with O2 saturations into the 70s on room air by report. Patient had been placed on BiPAP in the emergency department with ABG reassuring with pH 7.416, PCO2 36, and PO2 287. Patient was weaned from BiPAP to 8L high flow nasal cannula oxygen. Suspect aspect of this is secondary to possible upper respiratory infection, fluid overload, and/or COPD exacerbation. Lower suspicion for PE/blood clot as patient has been compliant with anticoagulation. -Continuous pulse oximetry with nasal cannula oxygen to maintain O2 saturations greater than 90% -Wean to room air as tolerated  Diastolic congestive heart failure exacerbation: On physical exam patient was noted to have lower extremity edema. BNP elevated up to 1092.8. Chest x-ray noting concern for possible small right-sided pleural effusion. Last echocardiogram from 07/31/2018 noted EF to be around 60-65%. Patient follows with Dr. Johnsie Cancel  of cardiology in outpatient setting. -Strict intake and output  -Daily weights -Check echocardiogram -Lasix 40 mg IV twice daily  [facility] cardiology consulted  Elevated troponin: Acute. Troponin initially noted to be 68 on admission. Patient denied any reports of chest pain. Prior  to arrival he had received 2 nitroglycerin. Elevated troponin could possibly secondary to demand. -Continue to monitor troponin   Microcytic anemia: Acute on  chronic: Hemoglobin initially noted to be 7.6 with low MCV and MCH.  Baseline hemoglobin appears to range from 8-9. Last colonoscopy with Dr. Benson Norway from 10/31/2018 revealed 220 to 30 mm sessile polyps that were removed. Endoscopically revealed gastritis with hemorrhage and sessile polyp in the duodenum. Patient was recommended to have repeat colonoscopy, but this does not appear to have been done. Patient denies any reports of bleeding. -Type and screen for possible need blood products -Check iron studies -Continue to monitor and transfuse for hemoglobins less than 8 g/dL -Encourage patient on the need to follow back up with Dr. Benson Norway of gastroenterology unless needing to formally consult during this hospitalization  Hyponatremia: Acute on chronic.  Sodium 124 on admission, but suspect possible hypervolemic hyponatremia given suspected CHF exacerbation. -Check urine sodium and osmolarity -Continue to monitor sodium level   Essential hypertension: Blood pressures currently stable. Home medications include amlodipine 10 mg daily and Coreg 12.5 mg twice daily. -Continue home blood pressure regimen as tolerated  COPD: Patient reported having some wheezing at home. On physical exam no wheezing appreciated, but aeration was decreased. Patient had received 125 mg of Solu-Medrol IV prior to arrival. -DuoNebs as needed for shortness of breath/flu  Peripheral vascular disease: Patient with prior history of right first digit amputation in 2019. -Continue statin and Xarelto  On chronic anticoagulation: For history of peripheral vascular disease. -Continue Xarelto  CAD: Patient with prior history of three-vessel CABG back in 2016. -Continue statin  Hyperlipidemia: Patient on atorvastatin 80 mg nightly. -Continue atorvastatin  Tobacco abuse: Patient admits to still smoking half pack cigarettes per day on average. -Counseled on the need for cessation of tobacco use -Nicotine patch offered  Alcohol  abuse: Patient reports drinking four 12 ounce beers per day on average. -CIWA protocol was initiated   History of gastritis: As noted on last enteroscopy by Dr. Epifania Gore from 10/2018. -Protonix  DVT prophylaxis: Xarelto Code Status: Full Family Communication: Daughter updated over the phone Disposition Plan: Possible discharge home in 2 to 3 days Consults called: Cardiology Admission status: Inpatient   Norval Morton MD [facility] Pager [phone]   If 7PM-7AM, please contact night-coverage  [email or web address] Password [facility]  08/07/2019, 7:59 AM

## 2019-08-07 NOTE — ED Provider Notes (Addendum)
[name] EMERGENCY DEPARTMENT Provider Note  CSN: [id number] Arrival date & time:    Chief Complaint(s) Shortness of Breath (BIPAP)  HPI Caleb [name] is a 69 y.o. male   The history is provided by the patient and the EMS personnel.  Shortness of Breath Severity:  Severe Onset quality:  Gradual Duration:  1 day Timing:  Constant Progression:  Worsening Chronicity:  Recurrent Relieved by: CPAP and NTG by EMS. Worsened by:  Exertion, movement and coughing Associated symptoms: cough   Associated symptoms: no abdominal pain, no chest pain, no fever, no hemoptysis and no wheezing    Received duonebs, and solumedrol by EMS  Past Medical History Past Medical History:  Diagnosis Date  . Acute respiratory failure with hypoxia ([place])   . Acute systolic congestive heart failure ([place]) 02/11/2014  . COPD (chronic obstructive pulmonary disease) ([place]) 02/11/2014  . Essential hypertension   . Hypercholesteremia   . 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 Tobacco abuse    Patient Active Problem List   Diagnosis Date Noted  . Gastritis with hemorrhage 02/03/2019  . PVD (peripheral vascular disease) ([place]) 02/03/2019  . Chronic anticoagulation 02/03/2019  . Acute kidney injury ([place]) 08/14/2018  . Iron deficiency anemia 08/14/2018  . Hyponatremia 08/13/2018  . Renal artery stenosis ([place]) 10/22/2017  . 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 Congestive heart disease ([place])   . Tobacco abuse   . Essential hypertension   . Hypercholesteremia   . Lactic acidosis 02/12/2014  . Sinus tachycardia 02/12/2014  . Elevated troponin   . COPD (chronic obstructive pulmonary disease) ([place]) 02/11/2014  . COPD with acute exacerbation ([place]) 02/11/2014  . Abnormal EKG 02/11/2014  . Acute systolic congestive heart failure ([place]) 02/11/2014   Home Medication(s) Prior to Admission medications   Medication Sig Start Date End Date Taking? Authorizing  Provider  acetaminophen (TYLENOL) 500 MG tablet Take 500 mg by mouth 2 (two) times a day.     [provider]  albuterol (VENTOLIN HFA) 108 (90 Base) MCG/ACT inhaler Inhale 2 puffs into the lungs every 6 (six) hours as needed for wheezing or shortness of breath. 07/24/18   Olalere, Cicero Duck A, MD  amLODipine (NORVASC) 10 MG tablet Take 1 tablet (10 mg total) by mouth daily. 03/11/17   Josue Hector, MD  atorvastatin (LIPITOR) 80 MG tablet Take 1 tablet (80 mg total) by mouth daily. 12/14/15   Josue Hector, MD  carvedilol (COREG) 12.5 MG tablet Take 1 tablet (12.5 mg total) by mouth 2 (two) times daily. 12/27/15   Josue Hector, MD  Cholecalciferol (VITAMIN D-3 PO) Take 1 capsule by mouth daily with breakfast.    [provider]  gabapentin (NEURONTIN) 300 MG capsule Take 300 mg by mouth 3 (three) times daily.    [provider]  nitroGLYCERIN (NITROSTAT) 0.4 MG SL tablet Place 1 tablet (0.4 mg total) under the tongue every 5 (five) minutes as needed for chest pain. 02/03/19 05/04/19  Erlene Quan, PA-C  vitamin B-12 (CYANOCOBALAMIN) 1000 MCG tablet Take 1,000 mcg by mouth daily with breakfast.    [provider]  XARELTO 20 MG TABS tablet Take 20 mg by mouth daily before breakfast. 06/13/18   [provider]  Past Surgical History Past Surgical History:  Procedure Laterality Date  . BIOPSY  10/31/2018   Procedure: BIOPSY;  Surgeon: Carol Ada, MD;  Location: [facility];  Service: Endoscopy;;  . cataract surgery  Bilateral 12-2017 and 01-2018  . COLONOSCOPY WITH PROPOFOL N/A 10/31/2018   Procedure: COLONOSCOPY WITH PROPOFOL;  Surgeon: Carol Ada, MD;  Location: [facility];  Service: Endoscopy;  Laterality: N/A;  . CORONARY ARTERY BYPASS GRAFT N/A 02/17/2014   Procedure: CORONARY ARTERY BYPASS GRAFTING (CABG);   Surgeon: Rexene Alberts, MD;  Location: [facility];  Service: Open Heart Surgery;  Laterality: N/A;  Times 3 using bilateral mammary arteries and endoscopically harvested right saphenous vein  . ESOPHAGOGASTRODUODENOSCOPY (EGD) WITH PROPOFOL N/A 10/31/2018   Procedure: ESOPHAGOGASTRODUODENOSCOPY (EGD) WITH PROPOFOL;  Surgeon: Carol Ada, MD;  Location: [facility];  Service: Endoscopy;  Laterality: N/A;  . FEMORAL ARTERY - FEMORAL ARTERY BYPASS GRAFT      right femoral artery to below-knee popliteal artery bypass with PTFE and right first ray amputation 04/25/17  . HEMOSTASIS CLIP PLACEMENT  10/31/2018   Procedure: HEMOSTASIS CLIP PLACEMENT;  Surgeon: Carol Ada, MD;  Location: [facility];  Service: Endoscopy;;  . INTRAOPERATIVE TRANSESOPHAGEAL ECHOCARDIOGRAM N/A 02/17/2014   Procedure: INTRAOPERATIVE TRANSESOPHAGEAL ECHOCARDIOGRAM;  Surgeon: Rexene Alberts, MD;  Location: [facility];  Service: Open Heart Surgery;  Laterality: N/A;  . LEFT HEART CATHETERIZATION WITH CORONARY ANGIOGRAM N/A 02/12/2014   Procedure: LEFT HEART CATHETERIZATION WITH CORONARY ANGIOGRAM;  Surgeon: Sinclair Grooms, MD;  Location: [facility];  Service: Cardiovascular;  Laterality: N/A;  . POLYPECTOMY  10/31/2018   Procedure: POLYPECTOMY;  Surgeon: Carol Ada, MD;  Location: [facility];  Service: Endoscopy;;  . SUBMUCOSAL LIFTING INJECTION  10/31/2018   Procedure: SUBMUCOSAL LIFTING INJECTION;  Surgeon: Carol Ada, MD;  Location: [facility];  Service: Endoscopy;;  . TOE AMPUTATION Right    right great toe    Family History Family History  Problem Relation Age of Onset  . Heart attack Mother   . Diabetes Mother   . Heart attack Brother   . Diabetes Brother   . Cancer Sister   . Stroke Neg Hx     Social History Social History   Tobacco Use  . Smoking status: Current Every Day Smoker    Packs/day: 0.50    Years: 35.00    Pack years: 17.50    Types: Cigarettes    Last attempt to quit: 02/11/2014     Years since quitting: 5.4  . Smokeless tobacco: Never Used  . Tobacco comment: has quit twice before   Vaping Use  . Vaping Use: Never used  Substance Use Topics  . Alcohol use: Yes    Alcohol/week: 0.0 standard drinks    Comment: Six pack a day of beer  . Drug use: Not on file   Allergies Sulfamethoxazole-trimethoprim, Cilostazol, and Lisinopril  Review of Systems Review of Systems  Constitutional: Negative for fever.  Respiratory: Positive for cough and shortness of breath. Negative for hemoptysis and wheezing.   Cardiovascular: Negative for chest pain.  Gastrointestinal: Negative for abdominal pain.   All other systems are reviewed and are negative for acute change except as noted in the HPI  Physical Exam Vital Signs  I have reviewed the triage vital signs Pulse 94   Temp 99.5 F (37.5 C) (Axillary)   Resp 22   Ht 5\' 7"  (1.702 m)   Wt 75 kg   SpO2 100%   BMI 25.90 kg/m  Physical Exam Vitals reviewed.  Constitutional:      General: He is not in acute distress.    Appearance: He is well-developed. He is not diaphoretic.  HENT:     Head: Normocephalic and atraumatic.     Nose: Nose normal.  Eyes:     General: No scleral icterus.       Right eye: No discharge.        Left eye: No discharge.     Conjunctiva/sclera: Conjunctivae normal.     Pupils: Pupils are equal, round, and reactive to light.  Cardiovascular:     Rate and Rhythm: Normal rate and regular rhythm.     Heart sounds: No murmur heard.  No friction rub. No gallop.   Pulmonary:     Effort: Pulmonary effort is normal. Tachypnea present.     Breath sounds: No stridor. Examination of the right-middle field reveals rales. Examination of the left-middle field reveals rales. Examination of the right-lower field reveals decreased breath sounds and rales. Examination of the left-lower field reveals rales. Decreased breath sounds and rales present.  Abdominal:     General: There is no distension.      Palpations: Abdomen is soft.     Tenderness: There is no abdominal tenderness.  Musculoskeletal:        General: No tenderness.     Cervical back: Normal range of motion and neck supple.     Right lower leg: 3+ Edema present.     Left lower leg: 1+ Edema present.     Right Lower Extremity: (great toe) Skin:    General: Skin is warm and dry.     Findings: No erythema or rash.  Neurological:     Mental Status: He is alert and oriented to person, place, and time.     ED Results and Treatments Labs (all labs ordered are listed, but only abnormal results are displayed) Labs Reviewed  CBC WITH DIFFERENTIAL/PLATELET - Abnormal; Notable for the following components:      Result Value   WBC 16.0 (*)    RBC 3.19 (*)    Hemoglobin 7.6 (*)    HCT 25.2 (*)    MCV 79.0 (*)    MCH 23.8 (*)    RDW 16.8 (*)    Neutro Abs 14.1 (*)    Lymphs Abs 0.5 (*)    Monocytes Absolute 1.2 (*)    Abs Immature Granulocytes 0.18 (*)    All other components within normal limits  COMPREHENSIVE METABOLIC PANEL - Abnormal; Notable for the following components:   Sodium 124 (*)    Chloride 92 (*)    CO2 21 (*)    Glucose, Bld 179 (*)    BUN 6 (*)    Calcium 8.8 (*)    Total Protein 6.4 (*)    Albumin 3.0 (*)    All other components within normal limits  PROTIME-INR - Abnormal; Notable for the following components:   Prothrombin Time 21.6 (*)    INR 2.0 (*)    All other components within normal limits  I-STAT ARTERIAL BLOOD GAS, ED - Abnormal; Notable for the following components:   pO2, Arterial 287 (*)    Sodium 124 (*)    HCT 25.0 (*)    Hemoglobin 8.5 (*)    All other components within normal limits  TROPONIN I (HIGH SENSITIVITY) - Abnormal; Notable for the following components:   Troponin I (High Sensitivity) 68 (*)    All other components within normal limits  SARS CORONAVIRUS 2 BY RT PCR (HOSPITAL ORDER, [facility])  CULTURE, BLOOD (ROUTINE X 2)  CULTURE, BLOOD  (ROUTINE X 2)  URINE CULTURE  LACTIC ACID, PLASMA  APTT  BLOOD GAS, ARTERIAL  BRAIN NATRIURETIC PEPTIDE  LACTIC ACID, PLASMA  URINALYSIS, ROUTINE W REFLEX MICROSCOPIC  TROPONIN I (HIGH SENSITIVITY)                                                                                                                         EKG  EKG Interpretation  Date/Time:  Friday August 07 2019 05:09:09 EDT Ventricular Rate:  91 PR Interval:    QRS Duration: 107 QT Interval:  381 QTC Calculation: 469 R Axis:   66 Text Interpretation: Sinus rhythm Borderline ST depression, lateral leads Confirmed by Addison Lank [phone]) on 08/07/2019 5:11:32 AM      Radiology DG Chest Port 1 View  Result Date: 08/07/2019 CLINICAL DATA:  Worsening shortness of breath since yesterday. EXAM: PORTABLE CHEST 1 VIEW COMPARISON:  Chest x-ray 03/11/2017 FINDINGS: Stable surgical changes from bypass surgery. Mild stable cardiac enlargement. Stable tortuosity and calcification of the thoracic aorta. Mild central vascular congestion but no pulmonary edema. Low lung volumes with vascular crowding and streaky basilar atelectasis. Possible small right effusion. The bony thorax is intact. Remote healed bilateral rib fractures are noted. IMPRESSION: 1. Low lung volumes with vascular crowding and streaky basilar atelectasis. 2. Possible small right effusion. Electronically Signed   By: Marijo Sanes M.D.   On: 08/07/2019 06:34    Pertinent labs & imaging results that were available during my care of the patient were reviewed by me and considered in my medical decision making (see chart for details).  Medications Ordered in ED Medications  cefTRIAXone (ROCEPHIN) 2 g in sodium chloride 0.9 % 100 mL IVPB (0 g Intravenous Stopped 08/07/19 0646)  azithromycin (ZITHROMAX) 500 mg in sodium chloride 0.9 % 250 mL IVPB (500 mg Intravenous New Bag/Given 08/07/19 0614)  acetaminophen (TYLENOL) suppository 650 mg (650 mg Rectal Given 08/07/19 0554)                                                                                                                                     Procedures .Critical Care Performed by: Fatima Blank, MD Authorized by: Fatima Blank, MD     CRITICAL CARE Performed by: Grayce Sessions Roshonda Sperl Total  critical care time: 55 minutes Critical care time was exclusive of separately billable procedures and treating other patients. Critical care was necessary to treat or prevent imminent or life-threatening deterioration. Critical care was time spent personally by me on the following activities: development of treatment plan with patient and/or surrogate as well as nursing, discussions with consultants, evaluation of patient's response to treatment, examination of patient, obtaining history from patient or surrogate, ordering and performing treatments and interventions, ordering and review of laboratory studies, ordering and review of radiographic studies, pulse oximetry and re-evaluation of patient's condition.   (including critical care time)  Medical Decision Making / ED Course I have reviewed the nursing notes for this encounter and the patient's prior records (if available in EHR or on provided paperwork).   [name] was evaluated in Emergency Department on 08/07/2019 for the symptoms described in the history of present illness. He was evaluated in the context of the global COVID-19 pandemic, which necessitated consideration that the patient might be at risk for infection with the SARS-CoV-2 virus that causes COVID-19. Institutional protocols and algorithms that pertain to the evaluation of patients at risk for COVID-19 are in a state of rapid change based on information released by regulatory bodies including the CDC and federal and state organizations. These policies and algorithms were followed during the patient's care in the ED.  Transitioned to BiPAP on arrival. satting well. ABG  reassuring. Will wean.  Noted to be febrile with rectal temp. Lungs with basilar rales. Code sepsis initiated and started on empiric Abx for likely pulmonary source.  Given the elevated BP reported by EMS and improvement in sx with NTG, there appears to be a component of HTN emergency. BP now is well controlled.  CXR did not reveal evidence of PNA.  Patient reassess and has no abd pain. COVID negative. Pending UA.  Patient is sating well on minimal BiPAP settings. Will attempt to wean. Plan to admit for further work up and management.  7:50 AM Weaned to [facility]       Final Clinical Impression(s) / ED Diagnoses Final diagnoses:  SOB (shortness of breath)  Acute respiratory failure with hypoxia (HCC)  Fever in adult  Bandemia      This chart was dictated using voice recognition software.  Despite best efforts to proofread,  errors can occur which can change the documentation meaning.     Fatima Blank, MD 08/07/19 [phone]

## 2019-08-07 NOTE — Progress Notes (Signed)
  Echocardiogram 2D Echocardiogram with definity has been performed.  Darlina Sicilian M 08/07/2019, 10:57 AM

## 2019-08-07 NOTE — ED Notes (Signed)
Report given to rn on 2w 

## 2019-08-08 ENCOUNTER — Encounter (HOSPITAL_COMMUNITY): Payer: Self-pay | Admitting: Internal Medicine

## 2019-08-08 DIAGNOSIS — R6510 Systemic inflammatory response syndrome (SIRS) of non-infectious origin without acute organ dysfunction: Secondary | ICD-10-CM | POA: Diagnosis not present

## 2019-08-08 LAB — BASIC METABOLIC PANEL
Anion gap: 11 (ref 5–15)
BUN: 13 mg/dL (ref 8–23)
CO2: 25 mmol/L (ref 22–32)
Calcium: 8.9 mg/dL (ref 8.9–10.3)
Chloride: 93 mmol/L — ABNORMAL LOW (ref 98–111)
Creatinine, Ser: 0.87 mg/dL (ref 0.61–1.24)
GFR calc Af Amer: >60 mL/min (ref >60)
GFR calc non Af Amer: >60 mL/min (ref >60)
Glucose, Bld: 134 mg/dL — ABNORMAL HIGH (ref 70–99)
Potassium: 3.1 mmol/L — ABNORMAL LOW (ref 3.5–5.1)
Sodium: 129 mmol/L — ABNORMAL LOW (ref 135–145)

## 2019-08-08 LAB — URINE CULTURE: Culture: NO GROWTH

## 2019-08-08 LAB — CBC WITH DIFFERENTIAL/PLATELET
Abs Immature Granulocytes: 0.06 10*3/uL (ref 0.00–0.07)
Basophils Absolute: 0 10*3/uL (ref 0.0–0.1)
Basophils Relative: 0 %
Eosinophils Absolute: 0 10*3/uL (ref 0.0–0.5)
Eosinophils Relative: 0 %
HCT: 25 % — ABNORMAL LOW (ref 39.0–52.0)
Hemoglobin: 7.7 g/dL — ABNORMAL LOW (ref 13.0–17.0)
Immature Granulocytes: 1 %
Lymphocytes Relative: 2 %
Lymphs Abs: 0.2 10*3/uL — ABNORMAL LOW (ref 0.7–4.0)
MCH: 23.8 pg — ABNORMAL LOW (ref 26.0–34.0)
MCHC: 30.8 g/dL (ref 30.0–36.0)
MCV: 77.2 fL — ABNORMAL LOW (ref 80.0–100.0)
Monocytes Absolute: 0.6 10*3/uL (ref 0.1–1.0)
Monocytes Relative: 5 %
Neutro Abs: 9.6 10*3/uL — ABNORMAL HIGH (ref 1.7–7.7)
Neutrophils Relative %: 92 %
Platelets: 216 10*3/uL (ref 150–400)
RBC: 3.24 MIL/uL — ABNORMAL LOW (ref 4.22–5.81)
RDW: 17 % — ABNORMAL HIGH (ref 11.5–15.5)
WBC: 10.4 10*3/uL (ref 4.0–10.5)
nRBC: 0 % (ref 0.0–0.2)

## 2019-08-08 MED ORDER — BUDESONIDE 0.25 MG/2ML IN SUSP
0.2500 mg | Freq: Two times a day (BID) | RESPIRATORY_TRACT | Status: DC
  Administered 2019-08-08 – 2019-08-10 (×5): 0.25 mg via RESPIRATORY_TRACT
  Filled 2019-08-08 (×5): qty 2

## 2019-08-08 MED ORDER — POTASSIUM CHLORIDE CRYS ER 20 MEQ PO TBCR
40.0000 meq | EXTENDED_RELEASE_TABLET | Freq: Two times a day (BID) | ORAL | Status: DC
  Administered 2019-08-08 – 2019-08-10 (×5): 40 meq via ORAL
  Filled 2019-08-08 (×5): qty 2

## 2019-08-08 MED ORDER — LOSARTAN POTASSIUM 25 MG PO TABS
25.0000 mg | ORAL_TABLET | Freq: Every day | ORAL | Status: DC
  Administered 2019-08-08 – 2019-08-10 (×3): 25 mg via ORAL
  Filled 2019-08-08 (×3): qty 1

## 2019-08-08 MED ORDER — IPRATROPIUM-ALBUTEROL 0.5-2.5 (3) MG/3ML IN SOLN
3.0000 mL | Freq: Four times a day (QID) | RESPIRATORY_TRACT | Status: DC
  Administered 2019-08-08: 3 mL via RESPIRATORY_TRACT
  Filled 2019-08-08: qty 3

## 2019-08-08 MED ORDER — IPRATROPIUM-ALBUTEROL 0.5-2.5 (3) MG/3ML IN SOLN: 3.0000 mL | Freq: Four times a day (QID) | RESPIRATORY_TRACT | Status: DC | PRN

## 2019-08-08 MED ORDER — METHYLPREDNISOLONE SODIUM SUCC 40 MG IJ SOLR
40.0000 mg | Freq: Two times a day (BID) | INTRAMUSCULAR | Status: DC
  Administered 2019-08-08: 40 mg via INTRAVENOUS
  Filled 2019-08-08: qty 1

## 2019-08-08 MED ORDER — PREDNISONE 20 MG PO TABS
40.0000 mg | ORAL_TABLET | Freq: Every day | ORAL | Status: DC
  Administered 2019-08-09 – 2019-08-13 (×5): 40 mg via ORAL
  Filled 2019-08-08 (×5): qty 2

## 2019-08-08 MED ORDER — ASPIRIN 81 MG PO CHEW
81.0000 mg | CHEWABLE_TABLET | Freq: Every day | ORAL | Status: DC
  Administered 2019-08-08 – 2019-08-16 (×9): 81 mg via ORAL
  Filled 2019-08-08 (×9): qty 1

## 2019-08-08 NOTE — Progress Notes (Addendum)
PROGRESS NOTE    Caleb [name]  GHW:[id number] DOB: [birth year] DOA: 08/07/2019 PCP: Christain Sacramento, MD   Brief Narrative:  Per HPI: Caleb [name] is a 69 y.o. male with medical history significant of hypertension, hyperlipidemia, CHF, PVD, CAD s/p CABG, COPD, and tobacco abuse presents with complaints of difficulty breathing for last 2 days. He complains of having a intermittent productive cough with clear sputum. Patient reported that he has airconditioning that was working properly, but complained of being hot. Associated symptoms of wheezing, intermittent sweats, nausea with couple episodes of nonbloody emesis, and generalized malaise. He notes that he chronically has leg swelling worse on the right than on the left as a result of vessels removed for CABG. Denies having any fever, chest pain, dysuria, recent sick contacts, change in taste/smell, diarrhea, blood in stools, NSAID use, or change in weight. At home he had tried utilizing his albuterol inhaler, but it did not seem to be working properly. He has not had any of the COVID-19 vaccines. He admits to smoking half pack cigarettes so per day on average and drinks 412 ounce beers per day on average.  En route with EMS patient was reported to have O2 saturations into the 70s on room air for which patient was placed on CPAP.  He received Solu-Medrol 125 mg IV, DuoNeb, and 2 sublingual nitroglycerin with improvement in symptoms.    -Patient was admitted with acute hypoxemic respiratory failure that appears to be multifactorial with acute CHF exacerbation as well as COPD exacerbation.  He was also started on Rocephin and azithromycin with SIRS criteria present.  Procalcitonin is elevated.  Respiratory viral panel negative and urine analysis negative for UTI.  Assessment & Plan:   Principal Problem:   SIRS (systemic inflammatory response syndrome) (HCC) Active Problems:   COPD (chronic obstructive pulmonary disease) (HCC)   Acute respiratory  failure with hypoxia (HCC)   Elevated troponin   Tobacco abuse   Essential hypertension   S/P CABG x 3   Hyponatremia   PVD (peripheral vascular disease) (HCC)   Chronic anticoagulation   SIRS: Acute.  Patient presented with complaints of feeling hot with sweats and intermittent cough. Fever up to 101.1 F and WBC 16.  Lactic acid was noted to be reassuring at 1.9.  COVID-19 screening was negative. Chest x-ray noted vascular crowding and streaky bibasilar atelectasis.  Urinalysis was still pending.  Sepsis protocol has been initiated with empiric antibiotics of Rocephin and azithromycin.  -Respiratory virus panel negative -Urinalysis with no signs of UTI -Procalcitonin elevated -Fever is improving -Continue Rocephin and azithromycin empirically for now  Acute respiratory failure with hypoxia: Patient presented and was initially noted to be hypoxic with O2 saturations into the 70s on room air by report. Patient had been placed on BiPAP in the emergency department with ABG reassuring with pH 7.416, PCO2 36, and PO2 287. Patient was weaned from BiPAP to 8L high flow nasal cannula oxygen. Suspect aspect of this is secondary to possible upper respiratory infection, fluid overload, and/or COPD exacerbation. Lower suspicion for PE/blood clot as patient has been compliant with anticoagulation. -Continuous pulse oximetry with nasal cannula oxygen to maintain O2 saturations greater than 90% -Wean to room air as tolerated -He is on room air at home -We will start on prednisone as well as Pulmicort for possible COPD -Continue breathing treatments as scheduled for COPD  Systolic congestive heart failure exacerbation: On physical exam patient was noted to have lower extremity edema. BNP elevated  up to 1092.8. Chest x-ray noting concern for possible small right-sided pleural effusion. Last echocardiogram from 07/31/2018 noted EF to be around 60-65%. Patient follows with Dr. Johnsie Cancel  of cardiology in  outpatient setting. -Strict intake and output  -Daily weights -2D echocardiogram LVEF 45-50% with global hypokinesis -Lasix 40 mg IV twice daily  --2 L fluid balance over last 24 hours -[facility] cardiology consulted and further recommendations appreciated  Elevated troponin: Acute. Troponin initially noted to be 68 on admission. Patient denied any reports of chest pain. Prior  to arrival he had received 2 nitroglycerin. Elevated troponin could possibly secondary to demand. -Likely secondary to demand ischemia   Microcytic anemia: Acute on chronic: Hemoglobin initially noted to be 7.6 with low MCV and MCH.  Baseline hemoglobin appears to range from 8-9. Last colonoscopy with Dr. Benson Norway from 10/31/2018 revealed 220 to 30 mm sessile polyps that were removed. Endoscopically revealed gastritis with hemorrhage and sessile polyp in the duodenum. Patient was recommended to have repeat colonoscopy, but this does not appear to have been done. Patient denies any reports of bleeding. -Iron deficiency anemia noted on anemia panel, hold off on iron supplementation with acute infection currently -Plan to transfuse if hemoglobin less than 7 -Recheck CBC in a.m.  Hyponatremia: Acute on chronic.  Sodium 124 on admission, but suspect possible hypervolemic hyponatremia given suspected CHF exacerbation. -Urine osmolarity is low when appropriate -Hyponatremia is improving, continue diuresis -Recheck in a.m.  Hypokalemia-likely secondary to diuresis -Replete and reevaluate in a.m. along with magnesium  Essential hypertension: Blood pressures currently stable.  -Continue on Coreg and losartan as ordered by cardiology  COPD: Patient reported having some wheezing at home. On physical exam no wheezing appreciated, but aeration was decreased. Patient had received 125 mg of Solu-Medrol IV prior to arrival. -With potential mild exacerbation -Steroids and breathing treatments as noted above  Peripheral vascular  disease: Patient with prior history of right first digit amputation in 2019. -Continue statin and Xarelto  On chronic anticoagulation: For history of peripheral vascular disease. -Continue Xarelto  CAD: Patient with prior history of three-vessel CABG back in 2016. -Continue statin  Hyperlipidemia: Patient on atorvastatin 80 mg nightly. -Continue atorvastatin  Tobacco abuse: Patient admits to still smoking half pack cigarettes per day on average. -Counseled on the need for cessation of tobacco use -Nicotine patch offered  Alcohol abuse: Patient reports drinking four 12 ounce beers per day on average. -CIWA protocol was initiated   History of gastritis: As noted on last enteroscopy by Dr. Epifania Gore from 10/2018. -Protonix   DVT prophylaxis: Xarelto Code Status: Full code Family Communication: None at bedside, will call daughter Disposition Plan:   Status is: Inpatient  Remains inpatient appropriate because:IV treatments appropriate due to intensity of illness or inability to take PO and Inpatient level of care appropriate due to severity of illness   Dispo: The patient is from: Home              Anticipated d/c is to: Home              Anticipated d/c date is: 2 days              Patient currently is not medically stable to d/c.  Consultants:   Cardiology  Procedures:   See below  Antimicrobials:  Anti-infectives (From admission, onward)   Start     Dose/Rate Route Frequency Ordered Stop   08/07/19 0600  cefTRIAXone (ROCEPHIN) 2 g in sodium chloride 0.9 % 100 mL  IVPB     Discontinue     2 g 200 mL/hr over 30 Minutes Intravenous Every 24 hours 08/07/19 0550     08/07/19 0600  azithromycin (ZITHROMAX) 500 mg in sodium chloride 0.9 % 250 mL IVPB     Discontinue     500 mg 250 mL/hr over 60 Minutes Intravenous Every 24 hours 08/07/19 0550         Subjective: Patient seen and evaluated today with no new acute complaints or concerns. No acute concerns or events  noted overnight.  He seems to be breathing better this morning.  Objective: Vitals:   08/08/19 0300 08/08/19 0430 08/08/19 0500 08/08/19 0800  BP: (!) 120/37 (!) 119/48  (!) 133/45  Pulse:  73  83  Resp:  20  20  Temp: 98.7 F (37.1 C) (!) 100.8 F (38.2 C)  100 F (37.8 C)  TempSrc:  Oral    SpO2:  99%  93%  Weight:   66.4 kg   Height:        Intake/Output Summary (Last 24 hours) at 08/08/2019 1019 Last data filed at 08/08/2019 0700 Gross per 24 hour  Intake 379.59 ml  Output 2475 ml  Net -2095.41 ml   Filed Weights   08/07/19 0504 08/07/19 2039 08/08/19 0500  Weight: 75 kg 65.5 kg 66.4 kg    Examination:  General exam: Appears calm and comfortable  Respiratory system: Clear to auscultation. Respiratory effort normal.  Currently on 3 L nasal cannula oxygen. Cardiovascular system: S1 & S2 heard, RRR. No JVD, murmurs, rubs, gallops or clicks. No pedal edema. Gastrointestinal system: Abdomen is nondistended, soft and nontender. No organomegaly or masses felt. Normal bowel sounds heard. Central nervous system: Alert and oriented. No focal neurological deficits. Extremities: Symmetric 5 x 5 power. Skin: No rashes, lesions or ulcers Psychiatry: Flat affect    Data Reviewed: I have personally reviewed following labs and imaging studies  CBC: Recent Labs  Lab 08/07/19 0513 08/07/19 0518 08/07/19 0901 08/08/19 0323  WBC  --  16.0*  --  10.4  NEUTROABS  --  14.1*  --  9.6*  HGB 8.5* 7.6* 7.8* 7.7*  HCT 25.0* 25.2* 26.4* 25.0*  MCV  --  79.0*  --  77.2*  PLT  --  228  --  259   Basic Metabolic Panel: Recent Labs  Lab 08/07/19 0513 08/07/19 0518 08/07/19 0901 08/07/19 1418 08/08/19 0323  NA 124* 124* 125*  --  129*  K 4.0 4.0 3.5  --  3.1*  CL  --  92* 92*  --  93*  CO2  --  21* 21*  --  25  GLUCOSE  --  179* 144*  --  134*  BUN  --  6* 8  --  13  CREATININE  --  0.85 0.81  --  0.87  CALCIUM  --  8.8* 9.0  --  8.9  MG  --   --   --  1.9  --   PHOS  --    --   --  3.2  --    GFR: Estimated Creatinine Clearance: 66.2 mL/min (by C-G formula based on SCr of 0.87 mg/dL). Liver Function Tests: Recent Labs  Lab 08/07/19 0518  AST 24  ALT 11  ALKPHOS 53  BILITOT 0.8  PROT 6.4*  ALBUMIN 3.0*   No results for input(s): LIPASE, AMYLASE in the last 168 hours. No results for input(s): AMMONIA in the last 168 hours. Coagulation Profile:  Recent Labs  Lab 08/07/19 0630  INR 2.0*   Cardiac Enzymes: No results for input(s): CKTOTAL, CKMB, CKMBINDEX, TROPONINI in the last 168 hours. BNP (last 3 results) No results for input(s): PROBNP in the last 8760 hours. HbA1C: No results for input(s): HGBA1C in the last 72 hours. CBG: No results for input(s): GLUCAP in the last 168 hours. Lipid Profile: No results for input(s): CHOL, HDL, LDLCALC, TRIG, CHOLHDL, LDLDIRECT in the last 72 hours. Thyroid Function Tests: No results for input(s): TSH, T4TOTAL, FREET4, T3FREE, THYROIDAB in the last 72 hours. Anemia Panel: Recent Labs    08/07/19 1418  FERRITIN 51  TIBC 326  IRON 7*   Sepsis Labs: Recent Labs  Lab 08/07/19 0558 08/07/19 0737 08/07/19 0946  PROCALCITON  --   --  0.84  LATICACIDVEN 1.9 1.4  --     Recent Results (from the past 240 hour(s))  SARS Coronavirus 2 by RT PCR (hospital order, performed in [facility] hospital lab) Nasopharyngeal Nasopharyngeal Swab     Status: None   Collection Time: 08/07/19  5:06 AM   Specimen: Nasopharyngeal Swab  Result Value Ref Range Status   SARS Coronavirus 2 NEGATIVE NEGATIVE Final    Comment: (NOTE) SARS-CoV-2 target nucleic acids are NOT DETECTED.  The SARS-CoV-2 RNA is generally detectable in upper and lower respiratory specimens during the acute phase of infection. The lowest concentration of SARS-CoV-2 viral copies this assay can detect is 250 copies / mL. A negative result does not preclude SARS-CoV-2 infection and should not be used as the sole basis for treatment or other patient  management decisions.  A negative result may occur with improper specimen collection / handling, submission of specimen other than nasopharyngeal swab, presence of viral mutation(s) within the areas targeted by this assay, and inadequate number of viral copies (<250 copies / mL). A negative result must be combined with clinical observations, patient history, and epidemiological information.  Fact Sheet for Patients:   StrictlyIdeas.no  Fact Sheet for Healthcare Providers: BankingDealers.co.za  This test is not yet approved or  cleared by the [place] FDA and has been authorized for detection and/or diagnosis of SARS-CoV-2 by FDA under an Emergency Use Authorization (EUA).  This EUA will remain in effect (meaning this test can be used) for the duration of the COVID-19 declaration under Section 564(b)(1) of the Act, 21 U.S.C. section 360bbb-3(b)(1), unless the authorization is terminated or revoked sooner.  Performed at [facility], [place] [address], [place], [place] [address]   Urine culture     Status: None   Collection Time: 08/07/19  5:58 AM   Specimen: In/Out Cath Urine  Result Value Ref Range Status   Specimen Description IN/OUT CATH URINE  Final   Special Requests NONE  Final   Culture   Final    NO GROWTH Performed at [facility], [place] [address], [place], [place] [address]    Report Status 08/08/2019 FINAL  Final  Blood Culture (routine x 2)     Status: None (Preliminary result)   Collection Time: 08/07/19  6:05 AM   Specimen: BLOOD RIGHT FOREARM  Result Value Ref Range Status   Specimen Description BLOOD RIGHT FOREARM  Final   Special Requests   Final    BOTTLES DRAWN AEROBIC AND ANAEROBIC Blood Culture adequate volume   Culture   Final    NO GROWTH 1 DAY Performed at [facility], [place] [address] [place], [place] [address]  Report Status PENDING  Incomplete  Blood Culture (routine x 2)      Status: None (Preliminary result)   Collection Time: 08/07/19  6:20 AM   Specimen: BLOOD LEFT HAND  Result Value Ref Range Status   Specimen Description BLOOD LEFT HAND  Final   Special Requests   Final    BOTTLES DRAWN AEROBIC AND ANAEROBIC Blood Culture results may not be optimal due to an inadequate volume of blood received in culture bottles   Culture   Final    NO GROWTH 1 DAY Performed at [facility], [place] [address] [place], [place] [address]    Report Status PENDING  Incomplete  Respiratory Panel by PCR     Status: None   Collection Time: 08/07/19  8:47 AM   Specimen: Nasopharyngeal Swab; Respiratory  Result Value Ref Range Status   Adenovirus NOT DETECTED NOT DETECTED Final   Coronavirus 229E NOT DETECTED NOT DETECTED Final    Comment: (NOTE) The Coronavirus on the Respiratory Panel, DOES NOT test for the novel  Coronavirus (2019 nCoV)    Coronavirus HKU1 NOT DETECTED NOT DETECTED Final   Coronavirus NL63 NOT DETECTED NOT DETECTED Final   Coronavirus OC43 NOT DETECTED NOT DETECTED Final   Metapneumovirus NOT DETECTED NOT DETECTED Final   Rhinovirus / Enterovirus NOT DETECTED NOT DETECTED Final   Influenza A NOT DETECTED NOT DETECTED Final   Influenza B NOT DETECTED NOT DETECTED Final   Parainfluenza Virus 1 NOT DETECTED NOT DETECTED Final   Parainfluenza Virus 2 NOT DETECTED NOT DETECTED Final   Parainfluenza Virus 3 NOT DETECTED NOT DETECTED Final   Parainfluenza Virus 4 NOT DETECTED NOT DETECTED Final   Respiratory Syncytial Virus NOT DETECTED NOT DETECTED Final   Bordetella pertussis NOT DETECTED NOT DETECTED Final   Chlamydophila pneumoniae NOT DETECTED NOT DETECTED Final   Mycoplasma pneumoniae NOT DETECTED NOT DETECTED Final    Comment: Performed at [facility], [place]. [address], [place], [place] [address]         Radiology Studies: DG Chest Port 1 View  Result Date: 08/07/2019 CLINICAL DATA:  Worsening shortness of breath since yesterday.  EXAM: PORTABLE CHEST 1 VIEW COMPARISON:  Chest x-ray 03/11/2017 FINDINGS: Stable surgical changes from bypass surgery. Mild stable cardiac enlargement. Stable tortuosity and calcification of the thoracic aorta. Mild central vascular congestion but no pulmonary edema. Low lung volumes with vascular crowding and streaky basilar atelectasis. Possible small right effusion. The bony thorax is intact. Remote healed bilateral rib fractures are noted. IMPRESSION: 1. Low lung volumes with vascular crowding and streaky basilar atelectasis. 2. Possible small right effusion. Electronically Signed   By: Marijo Sanes M.D.   On: 08/07/2019 06:34   ECHOCARDIOGRAM COMPLETE  Result Date: 08/07/2019    ECHOCARDIOGRAM REPORT   Patient Name:   MARCELLA DUNNAWAY Date of Exam: 08/07/2019 Medical Rec #:  [id number]   Height:       67.0 in Accession #:    [id number]  Weight:       165.3 lb Date of Birth:  [birth year]   BSA:          1.865 m Patient Age:    1 years    BP:           144/87 mmHg Patient Gender: M           HR:           76 bpm. Exam Location:  Inpatient Procedure: 2D Echo and  Intracardiac Opacification Agent Indications:    CHF-Acute Diastolic 673.41 / P37.90  History:        Patient has prior history of Echocardiogram examinations, most                 recent 07/31/2018. CAD, Prior CABG, PAD and COPD; Risk                 Factors:Current Smoker, Hypertension and Dyslipidemia.  Sonographer:    Darlina Sicilian RDCS Referring Phys: [id number] [place]  1. Left ventricular ejection fraction, by estimation, is 45 to 50%. The left ventricle has mildly decreased function. The left ventricle demonstrates global hypokinesis. Left ventricular diastolic parameters are consistent with Grade II diastolic dysfunction (pseudonormalization). Elevated left ventricular end-diastolic pressure.  2. Right ventricular systolic function is moderately reduced. The right ventricular size is normal. There is normal pulmonary artery systolic  pressure.  3. Left atrial size was mild to moderately dilated.  4. The mitral valve is normal in structure. Mild mitral valve regurgitation. No evidence of mitral stenosis.  5. Tricuspid valve regurgitation is mild to moderate.  6. The aortic valve is tricuspid. Aortic valve regurgitation is not visualized. No aortic stenosis is present.  7. The inferior vena cava is normal in size with greater than 50% respiratory variability, suggesting right atrial pressure of 3 mmHg. Comparison(s): Changes from prior study are noted. Compared to prior study, mildly reduced LV and moderately reduced RV function without focal wall motion abnormalities. FINDINGS  Left Ventricle: Left ventricular ejection fraction, by estimation, is 45 to 50%. The left ventricle has mildly decreased function. The left ventricle demonstrates global hypokinesis. Definity contrast agent was given IV to delineate the left ventricular  endocardial borders. The left ventricular internal cavity size was normal in size. There is no left ventricular hypertrophy. Left ventricular diastolic parameters are consistent with Grade II diastolic dysfunction (pseudonormalization). Elevated left ventricular end-diastolic pressure. Right Ventricle: The right ventricular size is normal. Right vetricular wall thickness was not assessed. Right ventricular systolic function is moderately reduced. There is normal pulmonary artery systolic pressure. The tricuspid regurgitant velocity is 2.87 m/s, and with an assumed right atrial pressure of 3 mmHg, the estimated right ventricular systolic pressure is 32.9 mmHg. Left Atrium: Left atrial size was mild to moderately dilated. Right Atrium: Right atrial size was normal in size. Pericardium: There is no evidence of pericardial effusion. Mitral Valve: The mitral valve is normal in structure. Mild mitral valve regurgitation. No evidence of mitral valve stenosis. Tricuspid Valve: The tricuspid valve is normal in structure. Tricuspid  valve regurgitation is mild to moderate. No evidence of tricuspid stenosis. Aortic Valve: The aortic valve is tricuspid. Aortic valve regurgitation is not visualized. No aortic stenosis is present. Pulmonic Valve: The pulmonic valve was not well visualized. Pulmonic valve regurgitation is trivial. No evidence of pulmonic stenosis. Aorta: The aortic root, ascending aorta and aortic arch are all structurally normal, with no evidence of dilitation or obstruction. Venous: The inferior vena cava is normal in size with greater than 50% respiratory variability, suggesting right atrial pressure of 3 mmHg. IAS/Shunts: The atrial septum is grossly normal.  LEFT VENTRICLE PLAX 2D LVIDd:         5.03 cm      Diastology LVIDs:         3.99 cm      LV e' lateral:   5.51 cm/s LV PW:         0.93 cm  LV E/e' lateral: 21.1 LV IVS:        1.06 cm      LV e' medial:    2.59 cm/s LVOT diam:     2.00 cm      LV E/e' medial:  44.8 LV SV:         67 LV SV Index:   36 LVOT Area:     3.14 cm  LV Volumes (MOD) LV vol d, MOD A2C: 130.0 ml LV vol d, MOD A4C: 123.0 ml LV vol s, MOD A2C: 72.5 ml LV vol s, MOD A4C: 65.4 ml LV SV MOD A2C:     57.5 ml LV SV MOD A4C:     123.0 ml LV SV MOD BP:      59.5 ml RIGHT VENTRICLE TAPSE (M-mode): 1.2 cm LEFT ATRIUM             Index       RIGHT ATRIUM           Index LA diam:        3.80 cm 2.04 cm/m  RA Area:     14.60 cm LA Vol (A2C):   74.3 ml 39.84 ml/m RA Volume:   36.00 ml  19.30 ml/m LA Vol (A4C):   44.9 ml 24.07 ml/m LA Biplane Vol: 58.9 ml 31.58 ml/m  AORTIC VALVE LVOT Vmax:   84.90 cm/s LVOT Vmean:  59.400 cm/s LVOT VTI:    0.213 m  AORTA Ao Root diam: 3.30 cm MITRAL VALVE                TRICUSPID VALVE MV Area (PHT): 7.16 cm     TR Peak grad:   32.9 mmHg MV Decel Time: 106 msec     TR Vmax:        287.00 cm/s MV E velocity: 116.00 cm/s MV A velocity: 72.80 cm/s   SHUNTS MV E/A ratio:  1.59         Systemic VTI:  0.21 m                             Systemic Diam: 2.00 cm Buford Dresser MD Electronically signed by Buford Dresser MD Signature Date/Time: 08/07/2019/1:43:58 PM    Final         Scheduled Meds: . atorvastatin  80 mg Oral Daily  . budesonide (PULMICORT) nebulizer solution  0.25 mg Nebulization BID  . carvedilol  12.5 mg Oral BID  . folic acid  1 mg Oral Daily  . furosemide  40 mg Intravenous BID  . gabapentin  300 mg Oral TID  . LORazepam  0-4 mg Intravenous Q6H   Followed by  . [START ON 08/09/2019] LORazepam  0-4 mg Intravenous Q12H  . losartan  25 mg Oral Daily  . multivitamin with minerals  1 tablet Oral Daily  . nicotine  21 mg Transdermal Daily  . pantoprazole  40 mg Oral Daily  . potassium chloride  40 mEq Oral BID  . [START ON 08/09/2019] predniSONE  40 mg Oral Q breakfast  . rivaroxaban  20 mg Oral Q supper  . sodium chloride flush  3 mL Intravenous Q12H  . thiamine  100 mg Oral Daily   Or  . thiamine  100 mg Intravenous Daily   Continuous Infusions: . sodium chloride 250 mL (08/08/19 0653)  . azithromycin 500 mg (08/08/19 0655)  . cefTRIAXone (ROCEPHIN)  IV 2 g (08/08/19 0656)  LOS: 1 day    Time spent: 35 minutes    Lailyn Appelbaum Darleen Crocker, DO [facility]  If 7PM-7AM, please contact night-coverage [email or web address] 08/08/2019, 10:19 AM

## 2019-08-08 NOTE — Progress Notes (Signed)
DAILY PROGRESS NOTE   Patient Name: Caleb [name] Date of Encounter: 08/08/2019 Cardiologist: Jenkins Rouge, MD  Chief Complaint   Breathing better  Patient Profile   Caleb [name] is a 69 y.o. 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 Tamala Julian.  Subjective   Caleb [name] is breathing better, but somnolent today. Rhonchorous cough. Diuresed 2.5L negative overnight. Troponin increased from 68-201. He is anemic with hemoglobin 7.7. Not a cath candidate at this point.  Objective   Vitals:   08/08/19 0300 08/08/19 0430 08/08/19 0500 08/08/19 0800  BP: (!) 120/37 (!) 119/48  (!) 133/45  Pulse:  73  83  Resp:  20  20  Temp: 98.7 F (37.1 C) (!) 100.8 F (38.2 C)  100 F (37.8 C)  TempSrc:  Oral    SpO2:  99%  93%  Weight:   66.4 kg   Height:        Intake/Output Summary (Last 24 hours) at 08/08/2019 1200 Last data filed at 08/08/2019 0700 Gross per 24 hour  Intake 379.59 ml  Output 1875 ml  Net -1495.41 ml   Filed Weights   08/07/19 0504 08/07/19 2039 08/08/19 0500  Weight: 75 kg 65.5 kg 66.4 kg    Physical Exam   General appearance: somnolent, in no distress Neck: JVD - 3 cm above sternal notch, no carotid bruit and thyroid not enlarged, symmetric, no tenderness/mass/nodules Lungs: diminished breath sounds bibasilar and rhonchi bilaterally Heart: regular rate and rhythm Abdomen: soft, non-tender; bowel sounds normal; no masses,  no organomegaly Extremities: extremities normal, atraumatic, no cyanosis or edema Pulses: 2+ and symmetric Skin: Skin color, texture, turgor normal. No rashes or lesions Neurologic: Mental status: somnolent, awakens to stimulus Psych:  non-agitated  Inpatient Medications    Scheduled Meds: . atorvastatin  80 mg Oral Daily  . budesonide (PULMICORT) nebulizer solution  0.25 mg Nebulization BID  . carvedilol  12.5 mg Oral BID  . folic acid  1 mg Oral Daily  . furosemide  40 mg Intravenous BID  . gabapentin  300 mg Oral TID  . LORazepam  0-4 mg Intravenous Q6H   Followed by  . [START ON 08/09/2019] LORazepam  0-4 mg Intravenous Q12H  . losartan  25 mg Oral Daily  . multivitamin with minerals  1 tablet Oral Daily  . nicotine  21 mg Transdermal Daily  . pantoprazole  40 mg Oral Daily  . potassium chloride  40 mEq Oral BID  . [START ON 08/09/2019] predniSONE  40 mg Oral Q breakfast  . rivaroxaban  20 mg Oral Q supper  . sodium chloride flush  3 mL Intravenous Q12H  . thiamine  100 mg Oral Daily   Or  . thiamine  100 mg Intravenous Daily    Continuous Infusions: . sodium chloride 250 mL (08/08/19 0653)  . azithromycin 500 mg (08/08/19 0655)  . cefTRIAXone (ROCEPHIN)  IV 2 g (08/08/19 0656)    PRN Meds: sodium chloride, acetaminophen, ipratropium-albuterol, LORazepam **OR** LORazepam, ondansetron (ZOFRAN) IV, sodium chloride flush   Labs   Results for orders placed or performed during the hospital encounter of 08/07/19 (from the past 48 hour(s))  SARS Coronavirus  2 by RT PCR (hospital order, performed in [facility] hospital lab) Nasopharyngeal Nasopharyngeal Swab     Status: None   Collection Time: 08/07/19  5:06 AM   Specimen: Nasopharyngeal Swab  Result Value Ref Range   SARS Coronavirus 2 NEGATIVE NEGATIVE    Comment: (NOTE) SARS-CoV-2 target nucleic acids are NOT DETECTED.  The SARS-CoV-2 RNA is generally detectable in upper and lower respiratory specimens during the acute phase of infection. The lowest concentration of SARS-CoV-2 viral copies this assay can detect is 250 copies / mL. A negative result does not preclude SARS-CoV-2 infection and should not be used as the sole basis for treatment or  other patient management decisions.  A negative result may occur with improper specimen collection / handling, submission of specimen other than nasopharyngeal swab, presence of viral mutation(s) within the areas targeted by this assay, and inadequate number of viral copies (<250 copies / mL). A negative result must be combined with clinical observations, patient history, and epidemiological information.  Fact Sheet for Patients:   StrictlyIdeas.no  Fact Sheet for Healthcare Providers: BankingDealers.co.za  This test is not yet approved or  cleared by the [place] FDA and has been authorized for detection and/or diagnosis of SARS-CoV-2 by FDA under an Emergency Use Authorization (EUA).  This EUA will remain in effect (meaning this test can be used) for the duration of the COVID-19 declaration under Section 564(b)(1) of the Act, 21 U.S.C. section 360bbb-3(b)(1), unless the authorization is terminated or revoked sooner.  Performed at [facility], [place] [address], [place], [place] [address]   I-Stat arterial blood gas, ED     Status: Abnormal   Collection Time: 08/07/19  5:13 AM  Result Value Ref Range   pH, Arterial 7.416 7.35 - 7.45   pCO2 arterial 36.1 32 - 48 mmHg   pO2, Arterial 287 (H) 83 - 108 mmHg   Bicarbonate 23.1 20.0 - 28.0 mmol/L   TCO2 24 22 - 32 mmol/L   O2 Saturation 100.0 %   Acid-base deficit 1.0 0.0 - 2.0 mmol/L   Sodium 124 (L) 135 - 145 mmol/L   Potassium 4.0 3.5 - 5.1 mmol/L   Calcium, Ion 1.19 1.15 - 1.40 mmol/L   HCT 25.0 (L) 39 - 52 %   Hemoglobin 8.5 (L) 13.0 - 17.0 g/dL   Patient temperature 99.5 F    Sample type ARTERIAL   CBC with Differential/Platelet     Status: Abnormal   Collection Time: 08/07/19  5:18 AM  Result Value Ref Range   WBC 16.0 (H) 4.0 - 10.5 K/uL   RBC 3.19 (L) 4.22 - 5.81 MIL/uL   Hemoglobin 7.6 (L) 13.0 - 17.0 g/dL   HCT 25.2 (L) 39 - 52 %   MCV 79.0 (L) 80.0 - 100.0  fL   MCH 23.8 (L) 26.0 - 34.0 pg   MCHC 30.2 30.0 - 36.0 g/dL   RDW 16.8 (H) 11.5 - 15.5 %   Platelets 228 150 - 400 K/uL   nRBC 0.0 0.0 - 0.2 %   Neutrophils Relative % 89 %   Neutro Abs 14.1 (H) 1.7 - 7.7 K/uL   Lymphocytes Relative 3 %   Lymphs Abs 0.5 (L) 0.7 - 4.0 K/uL   Monocytes Relative 7 %   Monocytes Absolute 1.2 (H) 0 - 1 K/uL   Eosinophils Relative 0 %   Eosinophils Absolute 0.0 0 - 0 K/uL   Basophils Relative 0 %   Basophils Absolute 0.1 0 -  0 K/uL   Immature Granulocytes 1 %   Abs Immature Granulocytes 0.18 (H) 0.00 - 0.07 K/uL    Comment: Performed at [facility], [place] [address], [place], [place] [address]  Comprehensive metabolic panel     Status: Abnormal   Collection Time: 08/07/19  5:18 AM  Result Value Ref Range   Sodium 124 (L) 135 - 145 mmol/L   Potassium 4.0 3.5 - 5.1 mmol/L   Chloride 92 (L) 98 - 111 mmol/L   CO2 21 (L) 22 - 32 mmol/L   Glucose, Bld 179 (H) 70 - 99 mg/dL    Comment: Glucose reference range applies only to samples taken after fasting for at least 8 hours.   BUN 6 (L) 8 - 23 mg/dL   Creatinine, Ser 0.85 0.61 - 1.24 mg/dL   Calcium 8.8 (L) 8.9 - 10.3 mg/dL   Total Protein 6.4 (L) 6.5 - 8.1 g/dL   Albumin 3.0 (L) 3.5 - 5.0 g/dL   AST 24 15 - 41 U/L   ALT 11 0 - 44 U/L   Alkaline Phosphatase 53 38 - 126 U/L   Total Bilirubin 0.8 0.3 - 1.2 mg/dL   GFR calc non Af Amer >60 >60 mL/min   GFR calc Af Amer >60 >60 mL/min   Anion gap 11 5 - 15    Comment: Performed at [facility] [address] [place], [place] [address]  Troponin I (High Sensitivity)     Status: Abnormal   Collection Time: 08/07/19  5:18 AM  Result Value Ref Range   Troponin I (High Sensitivity) 68 (H) <18 ng/L    Comment: (NOTE) Elevated high sensitivity troponin I (hsTnI) values and significant  changes across serial measurements may suggest ACS but many other  chronic and acute conditions are known to elevate hsTnI results.  Refer to the "Links" section  for chest pain algorithms and additional  guidance. Performed at [facility], [place] [address], [place], [place] [address]   Brain natriuretic peptide     Status: Abnormal   Collection Time: 08/07/19  5:19 AM  Result Value Ref Range   B Natriuretic Peptide 1,092.0 (H) 0.0 - 100.0 pg/mL    Comment: Performed at [facility] [address], [place], [place] [address]  Lactic acid, plasma     Status: None   Collection Time: 08/07/19  5:58 AM  Result Value Ref Range   Lactic Acid, Venous 1.9 0.5 - 1.9 mmol/L    Comment: Performed at [facility] [address] [place], [place] [address]  Urine culture     Status: None   Collection Time: 08/07/19  5:58 AM   Specimen: In/Out Cath Urine  Result Value Ref Range   Specimen Description IN/OUT CATH URINE    Special Requests NONE    Culture      NO GROWTH Performed at [facility], [place] [address] [place], [place] [address]    Report Status 08/08/2019 FINAL   Blood Culture (routine x 2)     Status: None (Preliminary result)   Collection Time: 08/07/19  6:05 AM   Specimen: BLOOD RIGHT FOREARM  Result Value Ref Range   Specimen Description BLOOD RIGHT FOREARM    Special Requests      BOTTLES DRAWN AEROBIC AND ANAEROBIC Blood Culture adequate volume   Culture      NO GROWTH 1 DAY Performed at [facility], [place]  [address] [place], [place] [address]    Report Status PENDING   Blood Culture (routine x 2)     Status: None (Preliminary result)   Collection Time: 08/07/19  6:20 AM   Specimen: BLOOD LEFT HAND  Result Value Ref Range   Specimen Description BLOOD LEFT HAND    Special Requests      BOTTLES DRAWN AEROBIC AND ANAEROBIC Blood Culture results may not be optimal due to an inadequate volume of blood received in culture bottles   Culture      NO GROWTH 1 DAY Performed at [facility], [place] [address], [place], [place] [address]    Report Status PENDING   Protime-INR     Status: Abnormal    Collection Time: 08/07/19  6:30 AM  Result Value Ref Range   Prothrombin Time 21.6 (H) 11.4 - 15.2 seconds   INR 2.0 (H) 0.8 - 1.2    Comment: (NOTE) INR goal varies based on device and disease states. Performed at [facility], [place] [address] [place], [place] [address]   APTT     Status: None   Collection Time: 08/07/19  6:30 AM  Result Value Ref Range   aPTT 36 24 - 36 seconds    Comment: Performed at [facility] [address] [place], [place] [address]  Lactic acid, plasma     Status: None   Collection Time: 08/07/19  7:37 AM  Result Value Ref Range   Lactic Acid, Venous 1.4 0.5 - 1.9 mmol/L    Comment: Performed at [facility] [address], [place], [place] [address]  Troponin I (High Sensitivity)     Status: Abnormal   Collection Time: 08/07/19  7:37 AM  Result Value Ref Range   Troponin I (High Sensitivity) 201 (HH) <18 ng/L    Comment: CRITICAL RESULT CALLED TO, READ BACK BY AND VERIFIED WITH: A.OAKLEY,RN [id number] 08/07/19 CLARK,S (NOTE) Elevated high sensitivity troponin I (hsTnI) values and significant  changes across serial measurements may suggest ACS but many other  chronic and acute conditions are known to elevate hsTnI results.  Refer to the Links section for chest pain algorithms and additional  guidance. Performed at [facility], [place] [address], [place], [place] [address]   Sodium, urine, random     Status: None   Collection Time: 08/07/19  8:32 AM  Result Value Ref Range   Sodium, Ur 26 mmol/L    Comment: Performed at [facility] [address] [place], [place] [address]  Urinalysis, Routine w reflex microscopic     Status: Abnormal   Collection Time: 08/07/19  8:40 AM  Result Value Ref Range   Color, Urine YELLOW YELLOW   APPearance CLEAR CLEAR   Specific Gravity, Urine 1.015 1.005 - 1.030   pH 6.0 5.0 - 8.0   Glucose, UA 50 (A) NEGATIVE mg/dL   Hgb urine dipstick NEGATIVE NEGATIVE   Bilirubin Urine NEGATIVE  NEGATIVE   Ketones, ur NEGATIVE NEGATIVE mg/dL   Protein, ur 100 (A) NEGATIVE mg/dL   Nitrite NEGATIVE NEGATIVE   Leukocytes,Ua NEGATIVE NEGATIVE   RBC / HPF 0-5 0 - 5 RBC/hpf   WBC, UA 0-5 0 - 5 WBC/hpf   Bacteria, UA RARE (A) NONE SEEN   Mucus PRESENT    Amorphous Crystal PRESENT     Comment: Performed at [facility] [address] [place], [place] [address]  Respiratory Panel by PCR  Status: None   Collection Time: 08/07/19  8:47 AM   Specimen: Nasopharyngeal Swab; Respiratory  Result Value Ref Range   Adenovirus NOT DETECTED NOT DETECTED   Coronavirus 229E NOT DETECTED NOT DETECTED    Comment: (NOTE) The Coronavirus on the Respiratory Panel, DOES NOT test for the novel  Coronavirus (2019 nCoV)    Coronavirus HKU1 NOT DETECTED NOT DETECTED   Coronavirus NL63 NOT DETECTED NOT DETECTED   Coronavirus OC43 NOT DETECTED NOT DETECTED   Metapneumovirus NOT DETECTED NOT DETECTED   Rhinovirus / Enterovirus NOT DETECTED NOT DETECTED   Influenza A NOT DETECTED NOT DETECTED   Influenza B NOT DETECTED NOT DETECTED   Parainfluenza Virus 1 NOT DETECTED NOT DETECTED   Parainfluenza Virus 2 NOT DETECTED NOT DETECTED   Parainfluenza Virus 3 NOT DETECTED NOT DETECTED   Parainfluenza Virus 4 NOT DETECTED NOT DETECTED   Respiratory Syncytial Virus NOT DETECTED NOT DETECTED   Bordetella pertussis NOT DETECTED NOT DETECTED   Chlamydophila pneumoniae NOT DETECTED NOT DETECTED   Mycoplasma pneumoniae NOT DETECTED NOT DETECTED    Comment: Performed at [facility], [place] [address] [place], [place] [address]  Hemoglobin and hematocrit, blood     Status: Abnormal   Collection Time: 08/07/19  9:01 AM  Result Value Ref Range   Hemoglobin 7.8 (L) 13.0 - 17.0 g/dL   HCT 26.4 (L) 39 - 52 %    Comment: Performed at [facility] [address] [place], [place] [address]  Basic metabolic panel     Status: Abnormal   Collection Time: 08/07/19  9:01 AM  Result Value Ref Range    Sodium 125 (L) 135 - 145 mmol/L   Potassium 3.5 3.5 - 5.1 mmol/L   Chloride 92 (L) 98 - 111 mmol/L   CO2 21 (L) 22 - 32 mmol/L   Glucose, Bld 144 (H) 70 - 99 mg/dL    Comment: Glucose reference range applies only to samples taken after fasting for at least 8 hours.   BUN 8 8 - 23 mg/dL   Creatinine, Ser 0.81 0.61 - 1.24 mg/dL   Calcium 9.0 8.9 - 10.3 mg/dL   GFR calc non Af Amer >60 >60 mL/min   GFR calc Af Amer >60 >60 mL/min   Anion gap 12 5 - 15    Comment: Performed at [facility] [address], [place], [place] [address]  Type and screen [place]     Status: None   Collection Time: 08/07/19  9:09 AM  Result Value Ref Range   ABO/RH(D) O POS    Antibody Screen NEG    Sample Expiration      08/10/2019,2359 Performed at [facility], [place] [address] [place], [place] [address]   Procalcitonin     Status: None   Collection Time: 08/07/19  9:46 AM  Result Value Ref Range   Procalcitonin 0.84 ng/mL    Comment:        Interpretation: PCT > 0.5 ng/mL and <= 2 ng/mL: Systemic infection (sepsis) is possible, but other conditions are known to elevate PCT as well. (NOTE)       Sepsis PCT Algorithm           Lower Respiratory Tract  Infection PCT Algorithm    ----------------------------     ----------------------------         PCT < 0.25 ng/mL                PCT < 0.10 ng/mL          Strongly encourage             Strongly discourage   discontinuation of antibiotics    initiation of antibiotics    ----------------------------     -----------------------------       PCT 0.25 - 0.50 ng/mL            PCT 0.10 - 0.25 ng/mL               OR       >80% decrease in PCT            Discourage initiation of                                            antibiotics      Encourage discontinuation           of antibiotics    ----------------------------     -----------------------------         PCT >= 0.50 ng/mL               PCT 0.26 - 0.50 ng/mL                AND       <80% decrease in PCT             Encourage initiation of                                             antibiotics       Encourage continuation           of antibiotics    ----------------------------     -----------------------------        PCT >= 0.50 ng/mL                  PCT > 0.50 ng/mL               AND         increase in PCT                  Strongly encourage                                      initiation of antibiotics    Strongly encourage escalation           of antibiotics                                     -----------------------------                                           PCT <= 0.25 ng/mL  OR                                        > 80% decrease in PCT                                      Discontinue / Do not initiate                                             antibiotics  Performed at [facility], [place] [address] [place], [place] [address]   Osmolality     Status: Abnormal   Collection Time: 08/07/19  2:18 PM  Result Value Ref Range   Osmolality 272 (L) 275 - 295 mOsm/kg    Comment: Performed at [facility], [place] [address] [place], [place] [address]  Magnesium     Status: None   Collection Time: 08/07/19  2:18 PM  Result Value Ref Range   Magnesium 1.9 1.7 - 2.4 mg/dL    Comment: Performed at [facility] [address] [place], [place] [address]  Phosphorus     Status: None   Collection Time: 08/07/19  2:18 PM  Result Value Ref Range   Phosphorus 3.2 2.5 - 4.6 mg/dL    Comment: Performed at [facility] [address] [place], [place] [address]  Iron and TIBC     Status: Abnormal   Collection Time: 08/07/19  2:18 PM  Result Value Ref Range   Iron 7 (L) 45 - 182 ug/dL   TIBC 326 250 - 450 ug/dL   Saturation Ratios 2 (L) 17.9 - 39.5 %   UIBC 319 ug/dL    Comment: Performed at [facility], [place] [address]  [place], [place] [address]  Ferritin     Status: None   Collection Time: 08/07/19  2:18 PM  Result Value Ref Range   Ferritin 51 24 - 336 ng/mL    Comment: Performed at [facility] [address] [place], [place] [address]  Basic metabolic panel     Status: Abnormal   Collection Time: 08/08/19  3:23 AM  Result Value Ref Range   Sodium 129 (L) 135 - 145 mmol/L   Potassium 3.1 (L) 3.5 - 5.1 mmol/L   Chloride 93 (L) 98 - 111 mmol/L   CO2 25 22 - 32 mmol/L   Glucose, Bld 134 (H) 70 - 99 mg/dL    Comment: Glucose reference range applies only to samples taken after fasting for at least 8 hours.   BUN 13 8 - 23 mg/dL   Creatinine, Ser 0.87 0.61 - 1.24 mg/dL   Calcium 8.9 8.9 - 10.3 mg/dL   GFR calc non Af Amer >60 >60 mL/min   GFR calc Af Amer >60 >60 mL/min   Anion gap 11 5 - 15    Comment: Performed at [facility] [address], [place], [place] [address]  CBC WITH DIFFERENTIAL     Status: Abnormal   Collection Time: 08/08/19  3:23 AM  Result Value Ref Range   WBC 10.4 4.0 - 10.5 K/uL   RBC 3.24 (L) 4.22 - 5.81  MIL/uL   Hemoglobin 7.7 (L) 13.0 - 17.0 g/dL    Comment: Reticulocyte Hemoglobin testing may be clinically indicated, consider ordering this additional test [id number]    HCT 25.0 (L) 39 - 52 %   MCV 77.2 (L) 80.0 - 100.0 fL   MCH 23.8 (L) 26.0 - 34.0 pg   MCHC 30.8 30.0 - 36.0 g/dL   RDW 17.0 (H) 11.5 - 15.5 %   Platelets 216 150 - 400 K/uL   nRBC 0.0 0.0 - 0.2 %   Neutrophils Relative % 92 %   Neutro Abs 9.6 (H) 1.7 - 7.7 K/uL   Lymphocytes Relative 2 %   Lymphs Abs 0.2 (L) 0.7 - 4.0 K/uL   Monocytes Relative 5 %   Monocytes Absolute 0.6 0 - 1 K/uL   Eosinophils Relative 0 %   Eosinophils Absolute 0.0 0 - 0 K/uL   Basophils Relative 0 %   Basophils Absolute 0.0 0 - 0 K/uL   Immature Granulocytes 1 %   Abs Immature Granulocytes 0.06 0.00 - 0.07 K/uL    Comment: Performed at [facility], [address] [place], [place] [address]    ECG    N/A  Telemetry   Sinus rhythm - Personally Reviewed  Radiology    DG Chest Port 1 View  Result Date: 08/07/2019 CLINICAL DATA:  Worsening shortness of breath since yesterday. EXAM: PORTABLE CHEST 1 VIEW COMPARISON:  Chest x-ray 03/11/2017 FINDINGS: Stable surgical changes from bypass surgery. Mild stable cardiac enlargement. Stable tortuosity and calcification of the thoracic aorta. Mild central vascular congestion but no pulmonary edema. Low lung volumes with vascular crowding and streaky basilar atelectasis. Possible small right effusion. The bony thorax is intact. Remote healed bilateral rib fractures are noted. IMPRESSION: 1. Low lung volumes with vascular crowding and streaky basilar atelectasis. 2. Possible small right effusion. Electronically Signed   By: Marijo Sanes M.D.   On: 08/07/2019 06:34   ECHOCARDIOGRAM COMPLETE  Result Date: 08/07/2019    ECHOCARDIOGRAM REPORT   Patient Name:   Caleb [name] Date of Exam: 08/07/2019 Medical Rec #:  [id number]   Height:       67.0 in Accession #:    [id number]  Weight:       165.3 lb Date of Birth:  1950-02-13   BSA:          1.865 m Patient Age:    64 years    BP:           144/87 mmHg Patient Gender: M           HR:           76 bpm. Exam Location:  Inpatient Procedure: 2D Echo and Intracardiac Opacification Agent Indications:    CHF-Acute Diastolic 323.55 / D32.20  History:        Patient has prior history of Echocardiogram examinations, most                 recent 07/31/2018. CAD, Prior CABG, PAD and COPD; Risk                 Factors:Current Smoker, Hypertension and Dyslipidemia.  Sonographer:    Darlina Sicilian RDCS Referring Phys: [id number] [place]  1. Left ventricular ejection fraction, by estimation, is 45 to 50%. The left ventricle has mildly decreased function. The left ventricle demonstrates global hypokinesis. Left ventricular diastolic parameters are consistent with Grade II diastolic dysfunction (pseudonormalization).  Elevated left ventricular end-diastolic pressure.  2. Right ventricular systolic function is moderately reduced. The right ventricular size is normal. There is normal pulmonary artery systolic pressure.  3. Left atrial size was mild to moderately dilated.  4. The mitral valve is normal in structure. Mild mitral valve regurgitation. No evidence of mitral stenosis.  5. Tricuspid valve regurgitation is mild to moderate.  6. The aortic valve is tricuspid. Aortic valve regurgitation is not visualized. No aortic stenosis is present.  7. The inferior vena cava is normal in size with greater than 50% respiratory variability, suggesting right atrial pressure of 3 mmHg. Comparison(s): Changes from prior study are noted. Compared to prior study, mildly reduced LV and moderately reduced RV function without focal wall motion abnormalities. FINDINGS  Left Ventricle: Left ventricular ejection fraction, by estimation, is 45 to 50%. The left ventricle has mildly decreased function. The left ventricle demonstrates global hypokinesis. Definity contrast agent was given IV to delineate the left ventricular  endocardial borders. The left ventricular internal cavity size was normal in size. There is no left ventricular hypertrophy. Left ventricular diastolic parameters are consistent with Grade II diastolic dysfunction (pseudonormalization). Elevated left ventricular end-diastolic pressure. Right Ventricle: The right ventricular size is normal. Right vetricular wall thickness was not assessed. Right ventricular systolic function is moderately reduced. There is normal pulmonary artery systolic pressure. The tricuspid regurgitant velocity is 2.87 m/s, and with an assumed right atrial pressure of 3 mmHg, the estimated right ventricular systolic pressure is 58.0 mmHg. Left Atrium: Left atrial size was mild to moderately dilated. Right Atrium: Right atrial size was normal in size. Pericardium: There is no evidence of pericardial effusion.  Mitral Valve: The mitral valve is normal in structure. Mild mitral valve regurgitation. No evidence of mitral valve stenosis. Tricuspid Valve: The tricuspid valve is normal in structure. Tricuspid valve regurgitation is mild to moderate. No evidence of tricuspid stenosis. Aortic Valve: The aortic valve is tricuspid. Aortic valve regurgitation is not visualized. No aortic stenosis is present. Pulmonic Valve: The pulmonic valve was not well visualized. Pulmonic valve regurgitation is trivial. No evidence of pulmonic stenosis. Aorta: The aortic root, ascending aorta and aortic arch are all structurally normal, with no evidence of dilitation or obstruction. Venous: The inferior vena cava is normal in size with greater than 50% respiratory variability, suggesting right atrial pressure of 3 mmHg. IAS/Shunts: The atrial septum is grossly normal.  LEFT VENTRICLE PLAX 2D LVIDd:         5.03 cm      Diastology LVIDs:         3.99 cm      LV e' lateral:   5.51 cm/s LV PW:         0.93 cm      LV E/e' lateral: 21.1 LV IVS:        1.06 cm      LV e' medial:    2.59 cm/s LVOT diam:     2.00 cm      LV E/e' medial:  44.8 LV SV:         67 LV SV Index:   36 LVOT Area:     3.14 cm  LV Volumes (MOD) LV vol d, MOD A2C: 130.0 ml LV vol d, MOD A4C: 123.0 ml LV vol s, MOD A2C: 72.5 ml LV vol s, MOD A4C: 65.4 ml LV SV MOD A2C:     57.5 ml LV SV MOD A4C:     123.0 ml LV SV MOD BP:  59.5 ml RIGHT VENTRICLE TAPSE (M-mode): 1.2 cm LEFT ATRIUM             Index       RIGHT ATRIUM           Index LA diam:        3.80 cm 2.04 cm/m  RA Area:     14.60 cm LA Vol (A2C):   74.3 ml 39.84 ml/m RA Volume:   36.00 ml  19.30 ml/m LA Vol (A4C):   44.9 ml 24.07 ml/m LA Biplane Vol: 58.9 ml 31.58 ml/m  AORTIC VALVE LVOT Vmax:   84.90 cm/s LVOT Vmean:  59.400 cm/s LVOT VTI:    0.213 m  AORTA Ao Root diam: 3.30 cm MITRAL VALVE                TRICUSPID VALVE MV Area (PHT): 7.16 cm     TR Peak grad:   32.9 mmHg MV Decel Time: 106 msec     TR Vmax:         287.00 cm/s MV E velocity: 116.00 cm/s MV A velocity: 72.80 cm/s   SHUNTS MV E/A ratio:  1.59         Systemic VTI:  0.21 m                             Systemic Diam: 2.00 cm Buford Dresser MD Electronically signed by Buford Dresser MD Signature Date/Time: 08/07/2019/1:43:58 PM    Final     Cardiac Studies   See echo above  Assessment   1. Principal Problem: 2.   SIRS (systemic inflammatory response syndrome) (HCC) 3. Active Problems: 4.   COPD (chronic obstructive pulmonary disease) ([place]) 5.   Acute respiratory failure with hypoxia (HCC) 6.   Elevated troponin 7.   Tobacco abuse 8.   Essential hypertension 9.   S/P CABG x 3 10.   Hyponatremia 11.   PVD (peripheral vascular disease) ([place]) 12.   Chronic anticoagulation 13.   Plan   1. Diuresed about 2.5L negative - he is anemic with hemoglobin of 7.7. troponin rise likely demand ischemia, however, not a cath candidate. Agree that we could consider outpatient myoview. Could have CAD or possible etoh cardiomyopathy. Echo shows moderate RV dysfunction as well, this could be chronic. On ABx for COPD exacerbation. Creatinine stable - continue diuretics. Consider transfusion for Hgb<7, however, it appears there is severe iron-deficiency with iron of 7 and trans sat of 2%. Replete potassium to >4.0.  History of CABG - with rising troponin, start low dose aspirin - monitor for S/S of bleeding, platelets ok.  Time Spent Directly with Patient:  I have spent a total of 25 minutes with the patient reviewing hospital notes, telemetry, EKGs, labs and examining the patient as well as establishing an assessment and plan that was discussed personally with the patient.  > 50% of time was spent in direct patient care.  Length of Stay:  LOS: 1 day   Pixie Casino, MD, [facility], [place] Director of the Advanced Lipid Disorders &  Cardiovascular Risk Reduction Clinic Diplomate of the American Board  of Clinical Lipidology Attending Cardiologist  Direct Dial: [phone]  Fax: [phone]  Website:  [email or web address] 08/08/2019, 12:00 PM

## 2019-08-09 LAB — CBC
HCT: 23.4 % — ABNORMAL LOW (ref 39.0–52.0)
HCT: 29.3 % — ABNORMAL LOW (ref 39.0–52.0)
Hemoglobin: 7.1 g/dL — ABNORMAL LOW (ref 13.0–17.0)
Hemoglobin: 8.9 g/dL — ABNORMAL LOW (ref 13.0–17.0)
MCH: 23.4 pg — ABNORMAL LOW (ref 26.0–34.0)
MCH: 23.2 pg — ABNORMAL LOW (ref 26.0–34.0)
MCHC: 30.3 g/dL (ref 30.0–36.0)
MCHC: 30.4 g/dL (ref 30.0–36.0)
MCV: 77 fL — ABNORMAL LOW (ref 80.0–100.0)
MCV: 76.3 fL — ABNORMAL LOW (ref 80.0–100.0)
Platelets: 210 10*3/uL (ref 150–400)
Platelets: 214 10*3/uL (ref 150–400)
RBC: 3.04 MIL/uL — ABNORMAL LOW (ref 4.22–5.81)
RBC: 3.84 MIL/uL — ABNORMAL LOW (ref 4.22–5.81)
RDW: 17.3 % — ABNORMAL HIGH (ref 11.5–15.5)
RDW: 16.4 % — ABNORMAL HIGH (ref 11.5–15.5)
WBC: 9.2 10*3/uL (ref 4.0–10.5)
WBC: 9.7 10*3/uL (ref 4.0–10.5)
nRBC: 0 % (ref 0.0–0.2)
nRBC: 0 % (ref 0.0–0.2)

## 2019-08-09 LAB — PREPARE RBC (CROSSMATCH)

## 2019-08-09 LAB — BASIC METABOLIC PANEL
Anion gap: 10 (ref 5–15)
BUN: 17 mg/dL (ref 8–23)
CO2: 25 mmol/L (ref 22–32)
Calcium: 8.6 mg/dL — ABNORMAL LOW (ref 8.9–10.3)
Chloride: 97 mmol/L — ABNORMAL LOW (ref 98–111)
Creatinine, Ser: 1 mg/dL (ref 0.61–1.24)
GFR calc Af Amer: >60 mL/min (ref >60)
GFR calc non Af Amer: >60 mL/min (ref >60)
Glucose, Bld: 139 mg/dL — ABNORMAL HIGH (ref 70–99)
Potassium: 3.9 mmol/L (ref 3.5–5.1)
Sodium: 132 mmol/L — ABNORMAL LOW (ref 135–145)

## 2019-08-09 LAB — HEMOGLOBIN AND HEMATOCRIT, BLOOD
HCT: 24.6 % — ABNORMAL LOW (ref 39.0–52.0)
Hemoglobin: 7.4 g/dL — ABNORMAL LOW (ref 13.0–17.0)

## 2019-08-09 LAB — MAGNESIUM: Magnesium: 2.1 mg/dL (ref 1.7–2.4)

## 2019-08-09 MED ORDER — SODIUM CHLORIDE 0.9% IV SOLUTION: Freq: Once | INTRAVENOUS | Status: DC

## 2019-08-09 MED ORDER — FUROSEMIDE 40 MG PO TABS
40.0000 mg | ORAL_TABLET | Freq: Every day | ORAL | Status: DC
  Administered 2019-08-10 – 2019-08-16 (×7): 40 mg via ORAL
  Filled 2019-08-09 (×7): qty 1

## 2019-08-09 NOTE — Progress Notes (Signed)
DAILY PROGRESS NOTE   Patient Name: Caleb [name] Date of Encounter: 08/09/2019 Cardiologist: Jenkins Rouge, MD  Chief Complaint   No complaints  Patient Profile   Caleb [name] is a 69 y.o. male with a hx of CAD s/p CABG x3 in 2016, PVD s/p right femoropopliteal bypass graft 04/2017 at [facility] on chronic Xarelto, left renal artery narrowing that is described as moderate, HTN, HLD, tobacco use and hx of gastritis with superficial bleeding per endoscopy 10/2018 who is being seen today for the evaluation of CHF at the request of Dr. Tamala Julian.  Subjective   No recorded urine output - weight stable. Creatinine trending up - may be at diuresis endpoint. Hemoglobin remains low, but stable.  Objective   Vitals:   08/09/19 0000 08/09/19 0414 08/09/19 0707 08/09/19 0823  BP: (!) 131/45 (!) 156/53  (!) 155/47  Pulse: 70 76  89  Resp:  18  20  Temp:  99.1 F (37.3 C)  97.8 F (36.6 C)  TempSrc:  Oral  Oral  SpO2:  94% 92% 94%  Weight:  66 kg    Height:        Intake/Output Summary (Last 24 hours) at 08/09/2019 1037 Last data filed at 08/09/2019 0900 Gross per 24 hour  Intake 809.69 ml  Output --  Net 809.69 ml   Filed Weights   08/07/19 2039 08/08/19 0500 08/09/19 0414  Weight: 65.5 kg 66.4 kg 66 kg    Physical Exam   General appearance: somnolent, in no distress Neck: JVD - 1 cm above sternal notch, no carotid bruit and thyroid not enlarged, symmetric, no tenderness/mass/nodules Lungs: diminished breath sounds bibasilar and rhonchi bilaterally Heart: regular rate and rhythm Abdomen: soft, non-tender; bowel sounds normal; no masses,  no organomegaly Extremities: extremities normal, atraumatic, no cyanosis or edema Pulses: 2+ and symmetric Skin: Skin color, texture, turgor normal. No rashes or lesions Neurologic: Mental status: somnolent, awakens to stimulus Psych: non-agitated  Inpatient Medications    Scheduled Meds: . aspirin  81 mg Oral Daily  . atorvastatin  80 mg  Oral Daily  . budesonide (PULMICORT) nebulizer solution  0.25 mg Nebulization BID  . carvedilol  12.5 mg Oral BID  . folic acid  1 mg Oral Daily  . furosemide  40 mg Intravenous BID  . gabapentin  300 mg Oral TID  . LORazepam  0-4 mg Intravenous Q12H  . losartan  25 mg Oral Daily  . multivitamin with minerals  1 tablet Oral Daily  . nicotine  21 mg Transdermal Daily  . pantoprazole  40 mg Oral Daily  . potassium chloride  40 mEq Oral BID  . predniSONE  40 mg Oral Q breakfast  . rivaroxaban  20 mg Oral Q supper  . sodium chloride flush  3 mL Intravenous Q12H  . thiamine  100 mg Oral Daily   Or  . thiamine  100 mg Intravenous Daily    Continuous Infusions: . sodium chloride 250 mL (08/08/19 0653)  . azithromycin 500 mg (08/09/19 0555)  . cefTRIAXone (ROCEPHIN)  IV 2 g (08/09/19 0513)    PRN Meds: sodium chloride, acetaminophen, ipratropium-albuterol, LORazepam **OR** LORazepam, ondansetron (ZOFRAN) IV, sodium chloride flush   Labs   Results for orders placed or performed during the hospital encounter of 08/07/19 (from the past 48 hour(s))  Osmolality     Status: Abnormal   Collection Time: 08/07/19  2:18 PM  Result Value Ref Range   Osmolality 272 (L) 275 -  295 mOsm/kg    Comment: Performed at [facility], [place] [address] [place], [place] [address]  Magnesium     Status: None   Collection Time: 08/07/19  2:18 PM  Result Value Ref Range   Magnesium 1.9 1.7 - 2.4 mg/dL    Comment: Performed at [facility] [address], [place], [place] [address]  Phosphorus     Status: None   Collection Time: 08/07/19  2:18 PM  Result Value Ref Range   Phosphorus 3.2 2.5 - 4.6 mg/dL    Comment: Performed at [facility] [address] [place], [place] [address]  Iron and TIBC     Status: Abnormal   Collection Time: 08/07/19  2:18 PM  Result Value Ref Range   Iron 7 (L) 45 - 182 ug/dL   TIBC 326 250 - 450 ug/dL   Saturation Ratios 2 (L) 17.9 - 39.5 %   UIBC 319  ug/dL    Comment: Performed at [facility], [place] [address], [place], [place] [address]  Ferritin     Status: None   Collection Time: 08/07/19  2:18 PM  Result Value Ref Range   Ferritin 51 24 - 336 ng/mL    Comment: Performed at [facility] [address] [place], [place] [address]  Basic metabolic panel     Status: Abnormal   Collection Time: 08/08/19  3:23 AM  Result Value Ref Range   Sodium 129 (L) 135 - 145 mmol/L   Potassium 3.1 (L) 3.5 - 5.1 mmol/L   Chloride 93 (L) 98 - 111 mmol/L   CO2 25 22 - 32 mmol/L   Glucose, Bld 134 (H) 70 - 99 mg/dL    Comment: Glucose reference range applies only to samples taken after fasting for at least 8 hours.   BUN 13 8 - 23 mg/dL   Creatinine, Ser 0.87 0.61 - 1.24 mg/dL   Calcium 8.9 8.9 - 10.3 mg/dL   GFR calc non Af Amer >60 >60 mL/min   GFR calc Af Amer >60 >60 mL/min   Anion gap 11 5 - 15    Comment: Performed at [facility] [address], [place], [place] [address]  CBC WITH DIFFERENTIAL     Status: Abnormal   Collection Time: 08/08/19  3:23 AM  Result Value Ref Range   WBC 10.4 4.0 - 10.5 K/uL   RBC 3.24 (L) 4.22 - 5.81 MIL/uL   Hemoglobin 7.7 (L) 13.0 - 17.0 g/dL    Comment: Reticulocyte Hemoglobin testing may be clinically indicated, consider ordering this additional test [id number]    HCT 25.0 (L) 39 - 52 %   MCV 77.2 (L) 80.0 - 100.0 fL   MCH 23.8 (L) 26.0 - 34.0 pg   MCHC 30.8 30.0 - 36.0 g/dL   RDW 17.0 (H) 11.5 - 15.5 %   Platelets 216 150 - 400 K/uL   nRBC 0.0 0.0 - 0.2 %   Neutrophils Relative % 92 %   Neutro Abs 9.6 (H) 1.7 - 7.7 K/uL   Lymphocytes Relative 2 %   Lymphs Abs 0.2 (L) 0.7 - 4.0 K/uL   Monocytes Relative 5 %   Monocytes Absolute 0.6 0 - 1 K/uL   Eosinophils Relative 0 %   Eosinophils Absolute 0.0 0 - 0 K/uL   Basophils Relative 0 %   Basophils Absolute 0.0 0 - 0 K/uL   Immature Granulocytes 1 %   Abs  Immature Granulocytes 0.06 0.00 - 0.07 K/uL    Comment: Performed at [facility], [place] [address], [place], [place] [address]  Basic metabolic panel     Status: Abnormal   Collection Time: 08/09/19  4:00 AM  Result Value Ref Range   Sodium 132 (L) 135 - 145 mmol/L   Potassium 3.9 3.5 - 5.1 mmol/L   Chloride 97 (L) 98 - 111 mmol/L   CO2 25 22 - 32 mmol/L   Glucose, Bld 139 (H) 70 - 99 mg/dL    Comment: Glucose reference range applies only to samples taken after fasting for at least 8 hours.   BUN 17 8 - 23 mg/dL   Creatinine, Ser 1.00 0.61 - 1.24 mg/dL   Calcium 8.6 (L) 8.9 - 10.3 mg/dL   GFR calc non Af Amer >60 >60 mL/min   GFR calc Af Amer >60 >60 mL/min   Anion gap 10 5 - 15    Comment: Performed at [facility] [address], [place], [place] [address]  Magnesium     Status: None   Collection Time: 08/09/19  4:00 AM  Result Value Ref Range   Magnesium 2.1 1.7 - 2.4 mg/dL    Comment: Performed at [facility] [address] [place], [place] [address]  CBC     Status: Abnormal   Collection Time: 08/09/19  4:00 AM  Result Value Ref Range   WBC 9.2 4.0 - 10.5 K/uL   RBC 3.04 (L) 4.22 - 5.81 MIL/uL   Hemoglobin 7.1 (L) 13.0 - 17.0 g/dL    Comment: Reticulocyte Hemoglobin testing may be clinically indicated, consider ordering this additional test [id number]    HCT 23.4 (L) 39 - 52 %   MCV 77.0 (L) 80.0 - 100.0 fL   MCH 23.4 (L) 26.0 - 34.0 pg   MCHC 30.3 30.0 - 36.0 g/dL   RDW 17.3 (H) 11.5 - 15.5 %   Platelets 210 150 - 400 K/uL   nRBC 0.0 0.0 - 0.2 %    Comment: Performed at [facility], [place] [address] [place], [place] [address]  Hemoglobin and hematocrit, blood     Status: Abnormal   Collection Time: 08/09/19  9:50 AM  Result Value Ref Range   Hemoglobin 7.4 (L) 13.0 - 17.0 g/dL   HCT 24.6 (L) 39 - 52 %    Comment: Performed at [facility] [address], [place],  [address]    ECG   N/A  Telemetry   Sinus rhythm - Personally Reviewed  Radiology    ECHOCARDIOGRAM COMPLETE  Result Date:  08/07/2019    ECHOCARDIOGRAM REPORT   Patient Name:   Caleb [name] Date of Exam: 08/07/2019 Medical Rec #:  [id number]   Height:       67.0 in Accession #:    [id number]  Weight:       165.3 lb Date of Birth:  Jan 27, 1950   BSA:          1.865 m Patient Age:    3 years    BP:           144/87 mmHg Patient Gender: M           HR:           76 bpm. Exam Location:  Inpatient Procedure: 2D Echo and Intracardiac Opacification Agent Indications:    CHF-Acute Diastolic 703.50 / K93.81  History:  Patient has prior history of Echocardiogram examinations, most                 recent 07/31/2018. CAD, Prior CABG, PAD and COPD; Risk                 Factors:Current Smoker, Hypertension and Dyslipidemia.  Sonographer:    Darlina Sicilian RDCS Referring Phys: [id number] [place]  1. Left ventricular ejection fraction, by estimation, is 45 to 50%. The left ventricle has mildly decreased function. The left ventricle demonstrates global hypokinesis. Left ventricular diastolic parameters are consistent with Grade II diastolic dysfunction (pseudonormalization). Elevated left ventricular end-diastolic pressure.  2. Right ventricular systolic function is moderately reduced. The right ventricular size is normal. There is normal pulmonary artery systolic pressure.  3. Left atrial size was mild to moderately dilated.  4. The mitral valve is normal in structure. Mild mitral valve regurgitation. No evidence of mitral stenosis.  5. Tricuspid valve regurgitation is mild to moderate.  6. The aortic valve is tricuspid. Aortic valve regurgitation is not visualized. No aortic stenosis is present.  7. The inferior vena cava is normal in size with greater than 50% respiratory variability, suggesting right atrial pressure of 3 mmHg. Comparison(s): Changes from prior study are noted. Compared to prior study, mildly reduced LV and moderately reduced RV function without focal wall motion abnormalities. FINDINGS  Left Ventricle: Left  ventricular ejection fraction, by estimation, is 45 to 50%. The left ventricle has mildly decreased function. The left ventricle demonstrates global hypokinesis. Definity contrast agent was given IV to delineate the left ventricular  endocardial borders. The left ventricular internal cavity size was normal in size. There is no left ventricular hypertrophy. Left ventricular diastolic parameters are consistent with Grade II diastolic dysfunction (pseudonormalization). Elevated left ventricular end-diastolic pressure. Right Ventricle: The right ventricular size is normal. Right vetricular wall thickness was not assessed. Right ventricular systolic function is moderately reduced. There is normal pulmonary artery systolic pressure. The tricuspid regurgitant velocity is 2.87 m/s, and with an assumed right atrial pressure of 3 mmHg, the estimated right ventricular systolic pressure is 03.5 mmHg. Left Atrium: Left atrial size was mild to moderately dilated. Right Atrium: Right atrial size was normal in size. Pericardium: There is no evidence of pericardial effusion. Mitral Valve: The mitral valve is normal in structure. Mild mitral valve regurgitation. No evidence of mitral valve stenosis. Tricuspid Valve: The tricuspid valve is normal in structure. Tricuspid valve regurgitation is mild to moderate. No evidence of tricuspid stenosis. Aortic Valve: The aortic valve is tricuspid. Aortic valve regurgitation is not visualized. No aortic stenosis is present. Pulmonic Valve: The pulmonic valve was not well visualized. Pulmonic valve regurgitation is trivial. No evidence of pulmonic stenosis. Aorta: The aortic root, ascending aorta and aortic arch are all structurally normal, with no evidence of dilitation or obstruction. Venous: The inferior vena cava is normal in size with greater than 50% respiratory variability, suggesting right atrial pressure of 3 mmHg. IAS/Shunts: The atrial septum is grossly normal.  LEFT VENTRICLE PLAX 2D  LVIDd:         5.03 cm      Diastology LVIDs:         3.99 cm      LV e' lateral:   5.51 cm/s LV PW:         0.93 cm      LV E/e' lateral: 21.1 LV IVS:        1.06 cm  LV e' medial:    2.59 cm/s LVOT diam:     2.00 cm      LV E/e' medial:  44.8 LV SV:         67 LV SV Index:   36 LVOT Area:     3.14 cm  LV Volumes (MOD) LV vol d, MOD A2C: 130.0 ml LV vol d, MOD A4C: 123.0 ml LV vol s, MOD A2C: 72.5 ml LV vol s, MOD A4C: 65.4 ml LV SV MOD A2C:     57.5 ml LV SV MOD A4C:     123.0 ml LV SV MOD BP:      59.5 ml RIGHT VENTRICLE TAPSE (M-mode): 1.2 cm LEFT ATRIUM             Index       RIGHT ATRIUM           Index LA diam:        3.80 cm 2.04 cm/m  RA Area:     14.60 cm LA Vol (A2C):   74.3 ml 39.84 ml/m RA Volume:   36.00 ml  19.30 ml/m LA Vol (A4C):   44.9 ml 24.07 ml/m LA Biplane Vol: 58.9 ml 31.58 ml/m  AORTIC VALVE LVOT Vmax:   84.90 cm/s LVOT Vmean:  59.400 cm/s LVOT VTI:    0.213 m  AORTA Ao Root diam: 3.30 cm MITRAL VALVE                TRICUSPID VALVE MV Area (PHT): 7.16 cm     TR Peak grad:   32.9 mmHg MV Decel Time: 106 msec     TR Vmax:        287.00 cm/s MV E velocity: 116.00 cm/s MV A velocity: 72.80 cm/s   SHUNTS MV E/A ratio:  1.59         Systemic VTI:  0.21 m                             Systemic Diam: 2.00 cm Buford Dresser MD Electronically signed by Buford Dresser MD Signature Date/Time: 08/07/2019/1:43:58 PM    Final     Cardiac Studies   See echo above  Assessment   Principal Problem:   SIRS (systemic inflammatory response syndrome) (HCC) Active Problems:   COPD (chronic obstructive pulmonary disease) (HCC)   Acute respiratory failure with hypoxia (HCC)   Elevated troponin   Acute systolic (congestive) heart failure (HCC)   Tobacco abuse   Essential hypertension   S/P CABG x 3   Hyponatremia   PVD (peripheral vascular disease) (HCC)   Chronic anticoagulation   Plan   Diuresis is slowing with rising (normal) creatinine - would d/c IV lasix and  transition to oral lasix tomorrow. Hemoglobin stable - on aspirin.  Time Spent Directly with Patient:  I have spent a total of 25 minutes with the patient reviewing hospital notes, telemetry, EKGs, labs and examining the patient as well as establishing an assessment and plan that was discussed personally with the patient.  > 50% of time was spent in direct patient care.  Length of Stay:  LOS: 2 days   Pixie Casino, MD, [facility], [place] Director of the Advanced Lipid Disorders &  Cardiovascular Risk Reduction Clinic Diplomate of the American Board of Clinical Lipidology Attending Cardiologist  Direct Dial: [phone]  Fax: [phone]  Website:  [email or web address]  08/09/2019, 10:37 AM

## 2019-08-09 NOTE — Progress Notes (Signed)
PROGRESS NOTE    Caleb [name]  VWU:[id number] DOB: April 02, 1950 DOA: 08/07/2019 PCP: Christain Sacramento, MD   Brief Narrative:   Per HPI: Caleb [name] a 69 y.o.malewith medical history significant ofhypertension, hyperlipidemia, CHF, PVD, CADs/pCABG, COPD, and tobacco abuse presents with complaints of difficulty breathing for last 2 days. He complains of havingaintermittent productive cough with clear sputum. Patient reported that he has airconditioning that was working properly, but complained of being hot. Associated symptoms of wheezing, intermittent sweats, nausea with couple episodes of nonbloody emesis, and generalized malaise. He notes that he chronically has leg swelling worse on the right than on the left as a result ofvesselsremoved for CABG. Denies having any fever, chest pain, dysuria, recent sick contacts, change in taste/smell, diarrhea, blood in stools, NSAID use, orchange in weight. At home he had tried utilizing his albuterol inhaler, but it did not seem to be working properly. He has not had any of the COVID-19 vaccines. He admits to smoking half pack cigarettes so per day on average and drinks 412 ounce beers per day on average.  En route withEMS patient was reported to have O2 saturations into the 70s on room air for which patient was placed on CPAP. He received Solu-Medrol 125 mg IV,DuoNeb, and 2 sublingual nitroglycerin with improvement in symptoms.   -Patient was admitted with acute hypoxemic respiratory failure that appears to be multifactorial with acute CHF exacerbation as well as COPD exacerbation.  He was also started on Rocephin and azithromycin with SIRS criteria present.  Procalcitonin is elevated.  Respiratory viral panel negative and urine analysis negative for UTI.  He appears to have diuresed well, but still remains quite hypoxemic and is also noted to be anemic with no overt bleeding identified.  Plan to transfuse 1 unit PRBC today.  Assessment & Plan:    Principal Problem:   SIRS (systemic inflammatory response syndrome) (HCC) Active Problems:   COPD (chronic obstructive pulmonary disease) (HCC)   Acute respiratory failure with hypoxia (HCC)   Elevated troponin   Acute systolic (congestive) heart failure (HCC)   Tobacco abuse   Essential hypertension   S/P CABG x 3   Hyponatremia   PVD (peripheral vascular disease) (HCC)   Chronic anticoagulation   SIRS: Acute.Patient presented with complaints of feeling hot with sweats and intermittent cough. Fever up to 101.1 F and WBC 16. Lactic acid was noted to be reassuring at 1.9. COVID-19 screening was negative. Chest x-ray noted vascular crowding and streaky bibasilar atelectasis. Urinalysis was still pending. Sepsis protocol has been initiated with empiric antibiotics of Rocephin and azithromycin.  -Respiratory virus panel negative -Urinalysis with no signs of UTI -Procalcitonin elevated -Fever is improving -Continue Rocephin and azithromycin empirically for now  Acute respiratory failure with hypoxia: Patient presented and was initially noted to be hypoxic with O2 saturations into the 70s on room air by report. Patient had been placed on BiPAP in the emergency department with ABG reassuringwith pH 7.416, PCO2 36, and PO2 287. Patient was weaned from BiPAP to 8L high flow nasal cannula oxygen. Suspect aspect of this is secondary to possible upper respiratory infection, fluid overload,and/or COPD exacerbation.Lower suspicion for PE/blood clot as patient has been compliant with anticoagulation. -Continuous pulse oximetry with nasal cannula oxygen to maintain O2 saturations greater than 90% -Wean to room air as tolerated -He is on room air at home -We will start on prednisone as well as Pulmicort for possible COPD -Continue breathing treatments as scheduled for COPD -Appears to  have diuresed well, but may be having some symptomatic anemia.  Plan to transfuse 1 unit PRBC  today.  Systolic congestive heart failure exacerbation: On physical exam patient was noted to have lower extremity edema. BNP elevated up to 1092.8. Chest x-ray noting concern for possible small right-sided pleural effusion. Last echocardiogram from 07/31/2018 noted EF to be around 60-65%. Patient follows with [facility] in outpatient setting. -Strict intake and output -Daily weights -2D echocardiogram LVEF 45-50% with global hypokinesis -Transition from IV Lasix to p.o. Lasix starting tomorrow per cardiology -Continue on aspirin per cardiology --2 L fluid balance over last 24 hours -[facility] cardiology consulted and further recommendations appreciated  Elevated troponin: Acute. Troponin initially noted to be 68 on admission. Patient denied any reports of chest pain.Priorto arrival he had received 2 nitroglycerin.Elevated troponin could possiblysecondary to demand. -Likely secondary to demand ischemia -Continue aspirin per cardiology  Microcytic anemia: Acute on chronic: Hemoglobin initially noted to be 7.6 with low MCV and MCH. Baseline hemoglobin appears to range from 8-9.Last colonoscopy with Dr. Benson Norway from 10/31/2018 revealed 220 to 30 mm sessile polyps that were removed. Endoscopically revealed gastritis with hemorrhage and sessile polyp in the duodenum. Patient was recommended to have repeat colonoscopy, but this does not appear to have been done. Patient denies any reports of bleeding. -Iron deficiency anemia noted on anemia panel, hold off on iron supplementation with acute infection currently -Plan to transfuse 1 unit PRBC today as he is likely having some symptomatic anemia -No overt bleeding noted, continue Xarelto -Recheck CBC in a.m. -Stool occult ordered and pending  Hyponatremia: Acute on chronic. Sodium 124 on admission, but suspect possible hypervolemic hyponatremia given suspected CHF exacerbation. -Urine osmolarity is low when appropriate -Hyponatremia  is improving, continue diuresis with p.o. Lasix tomorrow -Sodium 132 today -Recheck in a.m.  Hypokalemia-likely secondary to diuresis -Replete and reevaluate in a.m. along with magnesium  Essential hypertension: Blood pressures currently stable. -Continue on Coreg and losartan as ordered by cardiology  COPD: Patient reported having some wheezing at home. On physical exam no wheezing appreciated, but aeration was decreased. Patient had received 125 mg of Solu-Medrol IV prior to arrival. -With potential mild exacerbation -Steroids and breathing treatments as noted above  Peripheral vascular disease: Patient with prior history of right first digit amputation in 2019. -Continue statin and Xarelto  On chronic anticoagulation: For history of peripheral vascular disease. -Continue Xarelto  CAD: Patient with prior history of three-vessel CABG back in 2016. -Continue statin  Hyperlipidemia: Patient on atorvastatin 80 mg nightly. -Continue atorvastatin  Tobacco abuse: Patient admits to still smoking half pack cigarettes per day on average. -Counseled on the need for cessation of tobacco use -Nicotine patch offered  Alcohol abuse: Patient reports drinkingfour12 ounce beers per day on average. -CIWA protocol was initiated -No sign of DVT noted  History of gastritis: As noted on last enteroscopy by Dr. Epifania Gore from 10/2018. -Protonix   DVT prophylaxis: Xarelto Code Status: Full code Family Communication: None at bedside, spoke with daughter 7/25 Disposition Plan:   Status is: Inpatient  Remains inpatient appropriate because:IV treatments appropriate due to intensity of illness or inability to take PO and Inpatient level of care appropriate due to severity of illness   Dispo: The patient is from: Home  Anticipated d/c is to: Home  Anticipated d/c date is: 2 days  Patient currently is not medically stable to d/c.  Plan to  transfuse 1 unit PRBC today and monitor hemoglobin levels.  Stool occult ordered and  pending.  Consultants:   Cardiology  Procedures:   See below  Antimicrobials:  Anti-infectives (From admission, onward)   Start     Dose/Rate Route Frequency Ordered Stop   08/07/19 0600  cefTRIAXone (ROCEPHIN) 2 g in sodium chloride 0.9 % 100 mL IVPB     Discontinue     2 g 200 mL/hr over 30 Minutes Intravenous Every 24 hours 08/07/19 0550     08/07/19 0600  azithromycin (ZITHROMAX) 500 mg in sodium chloride 0.9 % 250 mL IVPB     Discontinue     500 mg 250 mL/hr over 60 Minutes Intravenous Every 24 hours 08/07/19 0550         Subjective: Patient seen and evaluated today with no new acute complaints or concerns. No acute concerns or events noted overnight.  He denies any significant shortness of breath today, but remains on 4 L nasal cannula oxygen.  No bowel movements noted since admission.  Mild fever up to 100.8 Fahrenheit overnight.  Objective: Vitals:   08/09/19 0000 08/09/19 0414 08/09/19 0707 08/09/19 0823  BP: (!) 131/45 (!) 156/53  (!) 155/47  Pulse: 70 76  89  Resp:  18  20  Temp:  99.1 F (37.3 C)  97.8 F (36.6 C)  TempSrc:  Oral  Oral  SpO2:  94% 92% 94%  Weight:  66 kg    Height:        Intake/Output Summary (Last 24 hours) at 08/09/2019 1206 Last data filed at 08/09/2019 0900 Gross per 24 hour  Intake 809.69 ml  Output --  Net 809.69 ml   Filed Weights   08/07/19 2039 08/08/19 0500 08/09/19 0414  Weight: 65.5 kg 66.4 kg 66 kg    Examination:  General exam: Appears calm and comfortable  Respiratory system: Clear to auscultation. Respiratory effort normal.  Currently on 4 L nasal cannula oxygen Cardiovascular system: S1 & S2 heard, RRR. No JVD, murmurs, rubs, gallops or clicks. No pedal edema. Gastrointestinal system: Abdomen is nondistended, soft and nontender. No organomegaly or masses felt. Normal bowel sounds heard. Central nervous system: Alert and  oriented. No focal neurological deficits. Extremities: Symmetric 5 x 5 power. Skin: No rashes, lesions or ulcers Psychiatry: Judgement and insight appear normal. Mood & affect appropriate.     Data Reviewed: I have personally reviewed following labs and imaging studies  CBC: Recent Labs  Lab 08/07/19 0518 08/07/19 0901 08/08/19 0323 08/09/19 0400 08/09/19 0950  WBC 16.0*  --  10.4 9.2  --   NEUTROABS 14.1*  --  9.6*  --   --   HGB 7.6* 7.8* 7.7* 7.1* 7.4*  HCT 25.2* 26.4* 25.0* 23.4* 24.6*  MCV 79.0*  --  77.2* 77.0*  --   PLT 228  --  216 210  --    Basic Metabolic Panel: Recent Labs  Lab 08/07/19 0513 08/07/19 0518 08/07/19 0901 08/07/19 1418 08/08/19 0323 08/09/19 0400  NA 124* 124* 125*  --  129* 132*  K 4.0 4.0 3.5  --  3.1* 3.9  CL  --  92* 92*  --  93* 97*  CO2  --  21* 21*  --  25 25  GLUCOSE  --  179* 144*  --  134* 139*  BUN  --  6* 8  --  13 17  CREATININE  --  0.85 0.81  --  0.87 1.00  CALCIUM  --  8.8* 9.0  --  8.9 8.6*  MG  --   --   --  1.9  --  2.1  PHOS  --   --   --  3.2  --   --    GFR: Estimated Creatinine Clearance: 57.6 mL/min (by C-G formula based on SCr of 1 mg/dL). Liver Function Tests: Recent Labs  Lab 08/07/19 0518  AST 24  ALT 11  ALKPHOS 53  BILITOT 0.8  PROT 6.4*  ALBUMIN 3.0*   No results for input(s): LIPASE, AMYLASE in the last 168 hours. No results for input(s): AMMONIA in the last 168 hours. Coagulation Profile: Recent Labs  Lab 08/07/19 0630  INR 2.0*   Cardiac Enzymes: No results for input(s): CKTOTAL, CKMB, CKMBINDEX, TROPONINI in the last 168 hours. BNP (last 3 results) No results for input(s): PROBNP in the last 8760 hours. HbA1C: No results for input(s): HGBA1C in the last 72 hours. CBG: No results for input(s): GLUCAP in the last 168 hours. Lipid Profile: No results for input(s): CHOL, HDL, LDLCALC, TRIG, CHOLHDL, LDLDIRECT in the last 72 hours. Thyroid Function Tests: No results for input(s): TSH,  T4TOTAL, FREET4, T3FREE, THYROIDAB in the last 72 hours. Anemia Panel: Recent Labs    08/07/19 1418  FERRITIN 51  TIBC 326  IRON 7*   Sepsis Labs: Recent Labs  Lab 08/07/19 0558 08/07/19 0737 08/07/19 0946  PROCALCITON  --   --  0.84  LATICACIDVEN 1.9 1.4  --     Recent Results (from the past 240 hour(s))  SARS Coronavirus 2 by RT PCR (hospital order, performed in [facility] hospital lab) Nasopharyngeal Nasopharyngeal Swab     Status: None   Collection Time: 08/07/19  5:06 AM   Specimen: Nasopharyngeal Swab  Result Value Ref Range Status   SARS Coronavirus 2 NEGATIVE NEGATIVE Final    Comment: (NOTE) SARS-CoV-2 target nucleic acids are NOT DETECTED.  The SARS-CoV-2 RNA is generally detectable in upper and lower respiratory specimens during the acute phase of infection. The lowest concentration of SARS-CoV-2 viral copies this assay can detect is 250 copies / mL. A negative result does not preclude SARS-CoV-2 infection and should not be used as the sole basis for treatment or other patient management decisions.  A negative result may occur with improper specimen collection / handling, submission of specimen other than nasopharyngeal swab, presence of viral mutation(s) within the areas targeted by this assay, and inadequate number of viral copies (<250 copies / mL). A negative result must be combined with clinical observations, patient history, and epidemiological information.  Fact Sheet for Patients:   StrictlyIdeas.no  Fact Sheet for Healthcare Providers: BankingDealers.co.za  This test is not yet approved or  cleared by the [place] FDA and has been authorized for detection and/or diagnosis of SARS-CoV-2 by FDA under an Emergency Use Authorization (EUA).  This EUA will remain in effect (meaning this test can be used) for the duration of the COVID-19 declaration under Section 564(b)(1) of the Act, 21 U.S.C. section  360bbb-3(b)(1), unless the authorization is terminated or revoked sooner.  Performed at [facility], [place] [address], [place], [place] [address]   Urine culture     Status: None   Collection Time: 08/07/19  5:58 AM   Specimen: In/Out Cath Urine  Result Value Ref Range Status   Specimen Description IN/OUT CATH URINE  Final   Special Requests NONE  Final   Culture   Final    NO GROWTH Performed at [facility], [place] [address] [place], [place] [address]    Report Status 08/08/2019  FINAL  Final  Blood Culture (routine x 2)     Status: None (Preliminary result)   Collection Time: 08/07/19  6:05 AM   Specimen: BLOOD RIGHT FOREARM  Result Value Ref Range Status   Specimen Description BLOOD RIGHT FOREARM  Final   Special Requests   Final    BOTTLES DRAWN AEROBIC AND ANAEROBIC Blood Culture adequate volume   Culture   Final    NO GROWTH 2 DAYS Performed at [facility], [address] [place], [place] [address]    Report Status PENDING  Incomplete  Blood Culture (routine x 2)     Status: None (Preliminary result)   Collection Time: 08/07/19  6:20 AM   Specimen: BLOOD LEFT HAND  Result Value Ref Range Status   Specimen Description BLOOD LEFT HAND  Final   Special Requests   Final    BOTTLES DRAWN AEROBIC AND ANAEROBIC Blood Culture results may not be optimal due to an inadequate volume of blood received in culture bottles   Culture   Final    NO GROWTH 2 DAYS Performed at [facility], [place] [address] [place], [place] [address]    Report Status PENDING  Incomplete  Respiratory Panel by PCR     Status: None   Collection Time: 08/07/19  8:47 AM   Specimen: Nasopharyngeal Swab; Respiratory  Result Value Ref Range Status   Adenovirus NOT DETECTED NOT DETECTED Final   Coronavirus 229E NOT DETECTED NOT DETECTED Final    Comment: (NOTE) The Coronavirus on the Respiratory Panel, DOES NOT test for the novel  Coronavirus (2019 nCoV)    Coronavirus HKU1 NOT  DETECTED NOT DETECTED Final   Coronavirus NL63 NOT DETECTED NOT DETECTED Final   Coronavirus OC43 NOT DETECTED NOT DETECTED Final   Metapneumovirus NOT DETECTED NOT DETECTED Final   Rhinovirus / Enterovirus NOT DETECTED NOT DETECTED Final   Influenza A NOT DETECTED NOT DETECTED Final   Influenza B NOT DETECTED NOT DETECTED Final   Parainfluenza Virus 1 NOT DETECTED NOT DETECTED Final   Parainfluenza Virus 2 NOT DETECTED NOT DETECTED Final   Parainfluenza Virus 3 NOT DETECTED NOT DETECTED Final   Parainfluenza Virus 4 NOT DETECTED NOT DETECTED Final   Respiratory Syncytial Virus NOT DETECTED NOT DETECTED Final   Bordetella pertussis NOT DETECTED NOT DETECTED Final   Chlamydophila pneumoniae NOT DETECTED NOT DETECTED Final   Mycoplasma pneumoniae NOT DETECTED NOT DETECTED Final    Comment: Performed at [facility], [place]. [address] [place], [place] [address]         Radiology Studies: No results found.      Scheduled Meds: . sodium chloride   Intravenous Once  . aspirin  81 mg Oral Daily  . atorvastatin  80 mg Oral Daily  . budesonide (PULMICORT) nebulizer solution  0.25 mg Nebulization BID  . carvedilol  12.5 mg Oral BID  . folic acid  1 mg Oral Daily  . [START ON 08/10/2019] furosemide  40 mg Oral Daily  . gabapentin  300 mg Oral TID  . LORazepam  0-4 mg Intravenous Q12H  . losartan  25 mg Oral Daily  . multivitamin with minerals  1 tablet Oral Daily  . nicotine  21 mg Transdermal Daily  . pantoprazole  40 mg Oral Daily  . potassium chloride  40 mEq Oral BID  . predniSONE  40 mg Oral Q breakfast  . rivaroxaban  20 mg Oral Q supper  . sodium chloride  flush  3 mL Intravenous Q12H  . thiamine  100 mg Oral Daily   Or  . thiamine  100 mg Intravenous Daily   Continuous Infusions: . sodium chloride 250 mL (08/08/19 0653)  . azithromycin 500 mg (08/09/19 0555)  . cefTRIAXone (ROCEPHIN)  IV 2 g (08/09/19 0513)     LOS: 2 days    Time spent: 35  minutes    Shantrell Placzek D Manuella Ghazi, DO [facility]  If 7PM-7AM, please contact night-coverage [email or web address] 08/09/2019, 12:06 PM

## 2019-08-10 DIAGNOSIS — E871 Hypo-osmolality and hyponatremia: Secondary | ICD-10-CM

## 2019-08-10 DIAGNOSIS — I10 Essential (primary) hypertension: Secondary | ICD-10-CM

## 2019-08-10 DIAGNOSIS — A419 Sepsis, unspecified organism: Principal | ICD-10-CM

## 2019-08-10 DIAGNOSIS — I5021 Acute systolic (congestive) heart failure: Secondary | ICD-10-CM

## 2019-08-10 DIAGNOSIS — I739 Peripheral vascular disease, unspecified: Secondary | ICD-10-CM

## 2019-08-10 DIAGNOSIS — D649 Anemia, unspecified: Secondary | ICD-10-CM

## 2019-08-10 DIAGNOSIS — Z951 Presence of aortocoronary bypass graft: Secondary | ICD-10-CM

## 2019-08-10 DIAGNOSIS — Z7901 Long term (current) use of anticoagulants: Secondary | ICD-10-CM

## 2019-08-10 DIAGNOSIS — R778 Other specified abnormalities of plasma proteins: Secondary | ICD-10-CM

## 2019-08-10 DIAGNOSIS — J189 Pneumonia, unspecified organism: Secondary | ICD-10-CM | POA: Diagnosis not present

## 2019-08-10 DIAGNOSIS — J441 Chronic obstructive pulmonary disease with (acute) exacerbation: Secondary | ICD-10-CM

## 2019-08-10 DIAGNOSIS — R5381 Other malaise: Secondary | ICD-10-CM

## 2019-08-10 DIAGNOSIS — J9601 Acute respiratory failure with hypoxia: Secondary | ICD-10-CM | POA: Diagnosis not present

## 2019-08-10 DIAGNOSIS — F1010 Alcohol abuse, uncomplicated: Secondary | ICD-10-CM

## 2019-08-10 LAB — CBC
HCT: 29.2 % — ABNORMAL LOW (ref 39.0–52.0)
Hemoglobin: 9.1 g/dL — ABNORMAL LOW (ref 13.0–17.0)
MCH: 24.1 pg — ABNORMAL LOW (ref 26.0–34.0)
MCHC: 31.2 g/dL (ref 30.0–36.0)
MCV: 77.5 fL — ABNORMAL LOW (ref 80.0–100.0)
Platelets: 207 10*3/uL (ref 150–400)
RBC: 3.77 MIL/uL — ABNORMAL LOW (ref 4.22–5.81)
RDW: 16.3 % — ABNORMAL HIGH (ref 11.5–15.5)
WBC: 6.8 10*3/uL (ref 4.0–10.5)
nRBC: 0 % (ref 0.0–0.2)

## 2019-08-10 LAB — TYPE AND SCREEN
ABO/RH(D): O POS
Antibody Screen: NEGATIVE
Unit division: 0

## 2019-08-10 LAB — BASIC METABOLIC PANEL
Anion gap: 7 (ref 5–15)
BUN: 18 mg/dL (ref 8–23)
CO2: 25 mmol/L (ref 22–32)
Calcium: 8.6 mg/dL — ABNORMAL LOW (ref 8.9–10.3)
Chloride: 97 mmol/L — ABNORMAL LOW (ref 98–111)
Creatinine, Ser: 0.95 mg/dL (ref 0.61–1.24)
GFR calc Af Amer: >60 mL/min (ref >60)
GFR calc non Af Amer: >60 mL/min (ref >60)
Glucose, Bld: 133 mg/dL — ABNORMAL HIGH (ref 70–99)
Potassium: 4.5 mmol/L (ref 3.5–5.1)
Sodium: 129 mmol/L — ABNORMAL LOW (ref 135–145)

## 2019-08-10 LAB — BPAM RBC
Blood Product Expiration Date: 202108022359
ISSUE DATE / TIME: 202107251205
Unit Type and Rh: 5100

## 2019-08-10 LAB — MAGNESIUM: Magnesium: 2 mg/dL (ref 1.7–2.4)

## 2019-08-10 MED ORDER — RIVAROXABAN 2.5 MG PO TABS
2.5000 mg | ORAL_TABLET | Freq: Two times a day (BID) | ORAL | Status: DC
  Administered 2019-08-10 – 2019-08-16 (×12): 2.5 mg via ORAL
  Filled 2019-08-10 (×13): qty 1

## 2019-08-10 MED ORDER — SODIUM CHLORIDE 0.9 % IV SOLN
510.0000 mg | Freq: Once | INTRAVENOUS | Status: AC
  Administered 2019-08-10: 510 mg via INTRAVENOUS
  Filled 2019-08-10: qty 17

## 2019-08-10 MED ORDER — LORAZEPAM 2 MG/ML IJ SOLN: 1.0000 mg | INTRAMUSCULAR | Status: AC | PRN

## 2019-08-10 MED ORDER — METOPROLOL TARTRATE 50 MG PO TABS
50.0000 mg | ORAL_TABLET | Freq: Two times a day (BID) | ORAL | Status: DC
  Administered 2019-08-10 – 2019-08-11 (×2): 50 mg via ORAL
  Filled 2019-08-10 (×2): qty 1

## 2019-08-10 MED ORDER — BUDESONIDE 0.5 MG/2ML IN SUSP
0.5000 mg | Freq: Two times a day (BID) | RESPIRATORY_TRACT | Status: DC
  Administered 2019-08-10 – 2019-08-16 (×11): 0.5 mg via RESPIRATORY_TRACT
  Filled 2019-08-10 (×12): qty 2

## 2019-08-10 MED ORDER — LORAZEPAM 1 MG PO TABS: 1.0000 mg | ORAL_TABLET | ORAL | Status: AC | PRN

## 2019-08-10 NOTE — [place] FL2 (Addendum)
[place] MEDICAID FL2 LEVEL OF CARE SCREENING TOOL     IDENTIFICATION  Patient Name: Caleb [name] Birthdate: Mar 22, 1950 Sex: male Admission Date (Current Location): 08/07/2019  [facility] and [place] Number:  Herbalist and Address:  [facility] [place], [place] [address], [place], [place] [address]      Provider Number: [id number]  Attending Physician Name and Address:  Mercy Riding, MD  Relative Name and Phone Number:       Current Level of Care: Hospital Recommended Level of Care: [place] Prior Approval Number:    Date Approved/Denied:   PASRR Number: [id number]  Discharge Plan: SNF    Current Diagnoses: Patient Active Problem List   Diagnosis Date Noted  . SIRS (systemic inflammatory response syndrome) ([place]) 08/07/2019  . Demand ischemia ([place])   . Gastritis with hemorrhage 02/03/2019  . PVD (peripheral vascular disease) ([place]) 02/03/2019  . Chronic anticoagulation 02/03/2019  . Acute kidney injury ([place]) 08/14/2018  . Iron deficiency anemia 08/14/2018  . Hyponatremia 08/13/2018  . Renal artery stenosis ([place]) 10/22/2017  . 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 Congestive heart disease ([place])   . Tobacco abuse   . Essential hypertension   . Hypercholesteremia   . Lactic acidosis 02/12/2014  . Sinus tachycardia 02/12/2014  . Acute respiratory failure with hypoxia ([place])   . Elevated troponin   . COPD (chronic obstructive pulmonary disease) ([place]) 02/11/2014  . COPD with acute exacerbation ([place]) 02/11/2014  . Abnormal EKG 02/11/2014  . Acute systolic (congestive) heart failure ([place]) 02/11/2014    Orientation RESPIRATION BLADDER Height & Weight     Self, Time, Situation, Place  Normal/Nasal Canula (4L o2) Incontinent, External catheter Weight: 145 lb 8.1 oz (66 kg) Height:  5\' 4"  (162.6 cm)  BEHAVIORAL SYMPTOMS/MOOD NEUROLOGICAL BOWEL NUTRITION STATUS      Continent Diet (see discharge summary)  AMBULATORY STATUS COMMUNICATION OF  NEEDS Skin   Extensive Assist Verbally Other (Comment), Skin abrasions, Surgical wounds (abrasion R knee; amputated R toe; generalized ecchymosis)                       Personal Care Assistance Level of Assistance  Bathing, Dressing, Feeding Bathing Assistance: Maximum assistance Feeding assistance: Independent Dressing Assistance: Maximum assistance     Functional Limitations Info  Sight, Hearing, Speech Sight Info: Adequate Hearing Info: Adequate Speech Info: Adequate    SPECIAL CARE FACTORS FREQUENCY  PT (By licensed PT), OT (By licensed OT)     PT Frequency: 5x week OT Frequency: 5x week            Contractures Contractures Info: Not present    Additional Factors Info  Code Status, Allergies Code Status Info: Full Code Allergies Info: Sulfamethoxazole-trimethoprim, Cilostazol, Lisinopril           Current Medications (08/10/2019):  This is the current hospital active medication list Current Facility-Administered Medications  Medication Dose Route Frequency Provider Last Rate Last Admin  . 0.9 %  sodium chloride infusion (Manually program via Guardrails IV Fluids)   Intravenous Once Manuella Ghazi, Pratik D, DO      . 0.9 %  sodium chloride infusion  250 mL Intravenous PRN Fuller Plan A, MD 10 mL/hr at 08/09/19 1213 250 mL at 08/09/19 1213  . acetaminophen (TYLENOL) tablet 650 mg  650 mg Oral Q4H PRN Fuller Plan A, MD      . aspirin chewable tablet 81 mg  81 mg Oral Daily  Pixie Casino, MD   81 mg at 08/10/19 0301  . atorvastatin (LIPITOR) tablet 80 mg  80 mg Oral Daily Smith, Rondell A, MD   80 mg at 08/10/19 0909  . azithromycin (ZITHROMAX) 500 mg in sodium chloride 0.9 % 250 mL IVPB  500 mg Intravenous Q24H Norval Morton, MD   Stopped at 08/10/19 0700  . budesonide (PULMICORT) nebulizer solution 0.5 mg  0.5 mg Nebulization BID Gonfa, Taye T, MD      . cefTRIAXone (ROCEPHIN) 2 g in sodium chloride 0.9 % 100 mL IVPB  2 g Intravenous Q24H Norval Morton, MD    Stopped at 08/10/19 0730  . folic acid (FOLVITE) tablet 1 mg  1 mg Oral Daily Tamala Julian, Rondell A, MD   1 mg at 08/10/19 0908  . furosemide (LASIX) tablet 40 mg  40 mg Oral Daily Pixie Casino, MD   40 mg at 08/10/19 0908  . gabapentin (NEURONTIN) capsule 300 mg  300 mg Oral TID Fuller Plan A, MD   300 mg at 08/10/19 1516  . ipratropium-albuterol (DUONEB) 0.5-2.5 (3) MG/3ML nebulizer solution 3 mL  3 mL Nebulization Q6H PRN Manuella Ghazi, Pratik D, DO      . LORazepam (ATIVAN) injection 0-4 mg  0-4 mg Intravenous Q12H Smith, Rondell A, MD   2 mg at 08/09/19 2032  . LORazepam (ATIVAN) tablet 1-4 mg  1-4 mg Oral Q1H PRN Mercy Riding, MD       Or  . LORazepam (ATIVAN) injection 1-4 mg  1-4 mg Intravenous Q1H PRN Wendee Beavers T, MD      . losartan (COZAAR) tablet 25 mg  25 mg Oral Daily Manuella Ghazi, Pratik D, DO   25 mg at 08/10/19 0908  . metoprolol tartrate (LOPRESSOR) tablet 50 mg  50 mg Oral BID Wendee Beavers T, MD      . multivitamin with minerals tablet 1 tablet  1 tablet Oral Daily Fuller Plan A, MD   1 tablet at 08/10/19 0908  . nicotine (NICODERM CQ - dosed in mg/24 hours) patch 21 mg  21 mg Transdermal Daily Fuller Plan A, MD   21 mg at 08/10/19 0909  . ondansetron (ZOFRAN) injection 4 mg  4 mg Intravenous Q6H PRN Smith, Rondell A, MD      . pantoprazole (PROTONIX) EC tablet 40 mg  40 mg Oral Daily Tamala Julian, Rondell A, MD   40 mg at 08/10/19 0907  . predniSONE (DELTASONE) tablet 40 mg  40 mg Oral Q breakfast Heath Lark D, DO   40 mg at 08/10/19 0615  . rivaroxaban (XARELTO) tablet 2.5 mg  2.5 mg Oral BID Gonfa, Taye T, MD      . sodium chloride flush (NS) 0.9 % injection 3 mL  3 mL Intravenous Q12H Smith, Rondell A, MD   3 mL at 08/10/19 0910  . sodium chloride flush (NS) 0.9 % injection 3 mL  3 mL Intravenous PRN Tamala Julian, Rondell A, MD      . thiamine tablet 100 mg  100 mg Oral Daily Fuller Plan A, MD   100 mg at 08/10/19 0909     Discharge Medications: Please see discharge summary for a list  of discharge medications.  Relevant Imaging Results:  Relevant Lab Results:   Additional Information SS#[id number]  [place]

## 2019-08-10 NOTE — TOC Initial Note (Signed)
Transition of Care [facility]) - Initial/Assessment Note    Patient Details  Name: Caleb [name] MRN: [id number] Date of Birth: Apr 19, 1950  Transition of [facility]) CM/SW Contact:    Emeterio Reeve, [place] Phone Number: 08/10/2019, 4:06 PM  Clinical Narrative:                  CSW met with pt at bedside. CSW introduced self and explained her role at the hospital.  Pt stated that PTA he was living at home alone and stated his daughter lives next door. Pt states that he was independent and was preforming all ADL's  PT/OT have not yet seen pt. CSW inquired if she can send info out to SNF facilities in the area. PT agreed.   CSW will continue to follow.   Expected Discharge Plan: Skilled Nursing Facility Barriers to Discharge: Continued Medical Work up   Patient Goals and CMS Choice Patient states their goals for this hospitalization and ongoing recovery are:: To return home CMS Medicare.gov Compare Post Acute Care list provided to:: Patient Choice offered to / list presented to : Patient  Expected Discharge Plan and Services Expected Discharge Plan: [place]       Living arrangements for the past 2 months: Single Family Home                                      Prior Living Arrangements/Services Living arrangements for the past 2 months: Single Family Home Lives with:: Self Patient language and need for interpreter reviewed:: Yes        Need for Family Participation in Patient Care: Yes (Comment) Care giver support system in place?: Yes (comment)   Criminal Activity/Legal Involvement Pertinent to Current Situation/Hospitalization: No - Comment as needed  Activities of Daily Living Home Assistive Devices/Equipment: Bedside commode/3-in-1, Shower chair without back ADL Screening (condition at time of admission) Patient's cognitive ability adequate to safely complete daily activities?: Yes Is the patient deaf or have difficulty hearing?: No Does the  patient have difficulty seeing, even when wearing glasses/contacts?: No Does the patient have difficulty concentrating, remembering, or making decisions?: No Patient able to express need for assistance with ADLs?: Yes Does the patient have difficulty dressing or bathing?: No Independently performs ADLs?: Yes (appropriate for developmental age) Does the patient have difficulty walking or climbing stairs?: Yes Weakness of Legs: Both Weakness of Arms/Hands: Both  Permission Sought/Granted Permission sought to share information with : Chartered certified accountant granted to share information with : Yes, Verbal Permission Granted     Permission granted to share info w AGENCY: SNF        Emotional Assessment Appearance:: Appears stated age Attitude/Demeanor/Rapport: Engaged Affect (typically observed): Appropriate Orientation: : Oriented to Self, Oriented to Place, Oriented to  Time, Oriented to Situation Alcohol / Substance Use: Not Applicable Psych Involvement: No (comment)  Admission diagnosis:  SOB (shortness of breath) [R06.02] Bandemia [D72.825] SIRS (systemic inflammatory response syndrome) (HCC) [R65.10] Acute respiratory failure with hypoxia (HCC) [J96.01] Fever in adult [R50.9] Patient Active Problem List   Diagnosis Date Noted  . SIRS (systemic inflammatory response syndrome) ([place]) 08/07/2019  . Demand ischemia ([place])   . Gastritis with hemorrhage 02/03/2019  . PVD (peripheral vascular disease) ([place]) 02/03/2019  . Chronic anticoagulation 02/03/2019  . Acute kidney injury ([place]) 08/14/2018  . Iron deficiency anemia 08/14/2018  . Hyponatremia 08/13/2018  . Renal artery  stenosis ([place]) 10/22/2017  . 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 Congestive heart disease ([place])   . Tobacco abuse   . Essential hypertension   . Hypercholesteremia   . Lactic acidosis 02/12/2014  . Sinus tachycardia 02/12/2014  . Acute respiratory failure with hypoxia ([place])   . Elevated troponin    . COPD (chronic obstructive pulmonary disease) ([place]) 02/11/2014  . COPD with acute exacerbation ([place]) 02/11/2014  . Abnormal EKG 02/11/2014  . Acute systolic (congestive) heart failure ([place]) 02/11/2014   PCP:  Christain Sacramento, MD Pharmacy:   [place], [place] - [address] [place] [place] [address] Phone: [phone] Fax: [facility] - [place], [place] [address] Phone: [phone] Fax: [phone]     Social Determinants of Health (SDOH) Interventions    Readmission Risk Interventions No flowsheet data found.  Emeterio Reeve, Latanya Presser, [place] Social Worker [phone]

## 2019-08-10 NOTE — Progress Notes (Signed)
PROGRESS NOTE  Caleb [name] RJJ:[id number] DOB: 06-02-1950   PCP: Christain Sacramento, MD  Patient is from: Home.  Lives alone.  Ambulates independently at baseline.  DOA: 08/07/2019 LOS: 3  Brief Narrative / Interim history: 69 year old male with history of CAD/CABG, COPD not on oxygen, CHF, PVD, HTN, HLD, tobacco abuse and alcohol abuse presenting with shortness of breath, cough, wheezing, intermittent diaphoresis, emesis, malaise and fever for 2 days prior to arrival.  In route with EMS, desaturated to 70s.  He received Solu-Medrol, DuoNeb and 2 sublingual nitro with improvement in his symptoms.  He was admitted for acute respiratory failure with hypoxia due to acute CHF, COPD and possible pneumonia.   Subjective: Seen and examined earlier this morning.  No major events overnight or this morning.  No complaints.  Reports improvement in his breathing and cough.  Denies chest pain, GI or UTI symptoms.  Currently on 4 L by nasal cannula.  He is not on oxygen at home.  Objective: Vitals:   08/10/19 0711 08/10/19 0717 08/10/19 0830 08/10/19 1115  BP:  (!) 95/50 (!) 161/50 (!) 132/41  Pulse:  81 73 62  Resp:  16  16  Temp:    98.1 F (36.7 C)  TempSrc:    Oral  SpO2: 92% 91% 94% 97%  Weight:      Height:        Intake/Output Summary (Last 24 hours) at 08/10/2019 1239 Last data filed at 08/10/2019 0600 Gross per 24 hour  Intake 315 ml  Output 1100 ml  Net -785 ml   Filed Weights   08/08/19 0500 08/09/19 0414 08/10/19 0332  Weight: 66.4 kg 66 kg 66 kg    Examination:  GENERAL: No apparent distress.  Nontoxic. HEENT: MMM.  Vision and hearing grossly intact.  NECK: Supple.  No apparent JVD.  RESP: 96% on 4 L.  No IWOB.  Fair aeration bilaterally. CVS:  RRR. Heart sounds normal.  ABD/GI/GU: BS+. Abd soft, NTND.  MSK/EXT:  Moves extremities. No apparent deformity.  RLE> LLE (chronic) SKIN: no apparent skin lesion or wound NEURO: Awake, alert and oriented fairly but poor insight.  No  apparent focal neuro deficit. PSYCH: Calm. Normal affect.  Procedures:  None  Microbiology summarized: COVID-19 PCR negative. Respiratory viral panel negative. Urine cultures negative. Blood cultures negative.  Assessment & Plan: Acute respiratory failure with hypoxia-multifactorial including CHF, pneumonia, anemia and COPD exacerbation. Required BiPAP on admission.  Now down to 4 L by [place].  Not on oxygen at home.  Lung exam reassuring -Treat CHF, pneumonia, anemia and COPD as below -Wean oxygen as able.  Minimum oxygen to keep saturation above 90% given history of COPD  Sepsis likely due to community-acquired pneumonia-POA.  He was febrile with leukocytosis on presentation.  COVID-19 PCR, RVP and blood cultures negative.  Procalcitonin elevated.  Sepsis physiology resolved.  -Rocephin and azithromycin  Acute on chronic systolicCHF: Some evidence of fluid overload with elevated BNP to 1100 and concerning CXR.  Echo eoyj EF of 45 to 50% and global hypokinesis (60 to 65% in 07/2018).  Currently appears euvolemic except for chronic RLE swelling.  UOP of 2 L / 24 hours.  Net -3.4 L so far. -On p.o. Lasix 40 mg daily per cardiology -Monitor fluid status, renal function and electrolytes -We will follow further cardiology recommendations  Elevated troponin: felt to be demand ischemia in the setting of the above.  No chest pain or anginal symptoms. -On aspirin per cardiology  Iron deficiency anemia: Baseline Hgb 8-9>7.6 (admit)> 7.1>1u> 8.9> 9.1. Iron 7. Iron sat 2%. Colo in 10/2018 with 22 to 30 mm sessile polyps that were resected.  EGD revealed gastritis with hemorrhage and sessile polyps in duodenum.  Repeat colonoscopy is recommended but didn't seem to follow up.  He denies melena or hematochezia.  He is on high dose Xarelto for PVD. No Hx of PE/DVT or A. fib.  He is also on aspirin. -Monitor H&H -Reduce Xarelto to 2.5 mg.  Recommended dose for PVD. -IV Feraheme -P.o. iron with bowel  regimen on discharge. -Follow Hemoccult -Need outpatient GI follow-up with Dr. Benson Norway for repeat colonoscopy  Acute on chronic hyponatremia:Na 124 on admit >> 132 > 129. Urine Na slightly high at 43.  Urine also 140.  Likely due to fluid overload from CHF. -Continue monitoring -We will check TSH and a.m. cortisol for completeness.  Hypokalemia-likely secondary to diuresis.  Resolved. -Monitor as appropriate  Essential hypertension: Seems to have labile BP -Continueon Coreg and losartan as ordered by cardiology -Continue holding amlodipine  COPD exacerbation: Had cardinal symptoms on presentation.  -Continue prednisone, budesonide and nebulizers. -Minimal oxygen to keep saturation above 90%.  Peripheral vascular disease: patient with prior history of right first digit amputation in 2019. -Continue statin and aspirin. -Reduce Xarelto to 2.5 mg daily  CAD: Patient with prior history of three-vessel CABG back in 2016. -Continue statin and aspirin  Hyperlipidemia: patient on atorvastatin 80 mg nightly. -Continue atorvastatin  Tobacco abuse: Smokes about 10 cigarettes a day -Counseled on the need for cessation of tobacco use -Nicotine patch offered  Alcohol abuse: Patient reports drinkingfour12 ounce beers per day on average. -CIWA protocol was initiated -No withdrawal symptoms. -Continue vitamins  History of gastritis: As noted on last enteroscopy by Dr. Benson Norway from 10/2018. -Protonix 40 mg daily   Body mass index is 24.98 kg/m.         DVT prophylaxis:   rivaroxaban (XARELTO) tablet 2.5 mg    Code Status: Full code Family Communication: Patient and/or RN. Available if any question.  Status is: Inpatient  Remains inpatient appropriate because:Hemodynamically unstable, Inpatient level of care appropriate due to severity of illness and Hypoxemia requiring supplemental oxygen   Dispo: The patient is from: Home              Anticipated d/c is to: To be  determined              Anticipated d/c date is: 2 days              Patient currently is not medically stable to d/c.       Consultants:  Cardiology   Sch Meds:  Scheduled Meds: . sodium chloride   Intravenous Once  . aspirin  81 mg Oral Daily  . atorvastatin  80 mg Oral Daily  . budesonide (PULMICORT) nebulizer solution  0.5 mg Nebulization BID  . folic acid  1 mg Oral Daily  . furosemide  40 mg Oral Daily  . gabapentin  300 mg Oral TID  . LORazepam  0-4 mg Intravenous Q12H  . losartan  25 mg Oral Daily  . metoprolol tartrate  50 mg Oral BID  . multivitamin with minerals  1 tablet Oral Daily  . nicotine  21 mg Transdermal Daily  . pantoprazole  40 mg Oral Daily  . potassium chloride  40 mEq Oral BID  . predniSONE  40 mg Oral Q breakfast  . rivaroxaban  2.5 mg Oral  BID  . sodium chloride flush  3 mL Intravenous Q12H  . thiamine  100 mg Oral Daily   Continuous Infusions: . sodium chloride 250 mL (08/09/19 1213)  . azithromycin 500 mg (08/10/19 0549)  . cefTRIAXone (ROCEPHIN)  IV 2 g (08/10/19 0511)   PRN Meds:.sodium chloride, acetaminophen, ipratropium-albuterol, LORazepam **OR** LORazepam, ondansetron (ZOFRAN) IV, sodium chloride flush  Antimicrobials: Anti-infectives (From admission, onward)   Start     Dose/Rate Route Frequency Ordered Stop   08/07/19 0600  cefTRIAXone (ROCEPHIN) 2 g in sodium chloride 0.9 % 100 mL IVPB     Discontinue     2 g 200 mL/hr over 30 Minutes Intravenous Every 24 hours 08/07/19 0550     08/07/19 0600  azithromycin (ZITHROMAX) 500 mg in sodium chloride 0.9 % 250 mL IVPB     Discontinue     500 mg 250 mL/hr over 60 Minutes Intravenous Every 24 hours 08/07/19 0550         I have personally reviewed the following labs and images: CBC: Recent Labs  Lab 08/07/19 0518 08/07/19 0901 08/08/19 0323 08/09/19 0400 08/09/19 0950 08/09/19 1634 08/10/19 0122  WBC 16.0*  --  10.4 9.2  --  9.7 6.8  NEUTROABS 14.1*  --  9.6*  --   --   --    --   HGB 7.6*   < > 7.7* 7.1* 7.4* 8.9* 9.1*  HCT 25.2*   < > 25.0* 23.4* 24.6* 29.3* 29.2*  MCV 79.0*  --  77.2* 77.0*  --  76.3* 77.5*  PLT 228  --  216 210  --  214 207   < > = values in this interval not displayed.   BMP &GFR Recent Labs  Lab 08/07/19 0518 08/07/19 0901 08/07/19 1418 08/08/19 0323 08/09/19 0400 08/10/19 0122  NA 124* 125*  --  129* 132* 129*  K 4.0 3.5  --  3.1* 3.9 4.5  CL 92* 92*  --  93* 97* 97*  CO2 21* 21*  --  25 25 25   GLUCOSE 179* 144*  --  134* 139* 133*  BUN 6* 8  --  13 17 18   CREATININE 0.85 0.81  --  0.87 1.00 0.95  CALCIUM 8.8* 9.0  --  8.9 8.6* 8.6*  MG  --   --  1.9  --  2.1 2.0  PHOS  --   --  3.2  --   --   --    Estimated Creatinine Clearance: 60.6 mL/min (by C-G formula based on SCr of 0.95 mg/dL). Liver & Pancreas: Recent Labs  Lab 08/07/19 0518  AST 24  ALT 11  ALKPHOS 53  BILITOT 0.8  PROT 6.4*  ALBUMIN 3.0*   No results for input(s): LIPASE, AMYLASE in the last 168 hours. No results for input(s): AMMONIA in the last 168 hours. Diabetic: No results for input(s): HGBA1C in the last 72 hours. No results for input(s): GLUCAP in the last 168 hours. Cardiac Enzymes: No results for input(s): CKTOTAL, CKMB, CKMBINDEX, TROPONINI in the last 168 hours. No results for input(s): PROBNP in the last 8760 hours. Coagulation Profile: Recent Labs  Lab 08/07/19 0630  INR 2.0*   Thyroid Function Tests: No results for input(s): TSH, T4TOTAL, FREET4, T3FREE, THYROIDAB in the last 72 hours. Lipid Profile: No results for input(s): CHOL, HDL, LDLCALC, TRIG, CHOLHDL, LDLDIRECT in the last 72 hours. Anemia Panel: Recent Labs    08/07/19 1418  FERRITIN 51  TIBC 326  IRON 7*  Urine analysis:    Component Value Date/Time   COLORURINE YELLOW 08/07/2019 0840   APPEARANCEUR CLEAR 08/07/2019 0840   LABSPEC 1.015 08/07/2019 0840   PHURINE 6.0 08/07/2019 0840   GLUCOSEU 50 (A) 08/07/2019 0840   HGBUR NEGATIVE 08/07/2019 0840    BILIRUBINUR NEGATIVE 08/07/2019 0840   KETONESUR NEGATIVE 08/07/2019 0840   PROTEINUR 100 (A) 08/07/2019 0840   UROBILINOGEN 0.2 02/13/2014 1645   NITRITE NEGATIVE 08/07/2019 0840   LEUKOCYTESUR NEGATIVE 08/07/2019 0840   Sepsis Labs: Invalid input(s): PROCALCITONIN, [place]  Microbiology: Recent Results (from the past 240 hour(s))  SARS Coronavirus 2 by RT PCR (hospital order, performed in [facility] hospital lab) Nasopharyngeal Nasopharyngeal Swab     Status: None   Collection Time: 08/07/19  5:06 AM   Specimen: Nasopharyngeal Swab  Result Value Ref Range Status   SARS Coronavirus 2 NEGATIVE NEGATIVE Final    Comment: (NOTE) SARS-CoV-2 target nucleic acids are NOT DETECTED.  The SARS-CoV-2 RNA is generally detectable in upper and lower respiratory specimens during the acute phase of infection. The lowest concentration of SARS-CoV-2 viral copies this assay can detect is 250 copies / mL. A negative result does not preclude SARS-CoV-2 infection and should not be used as the sole basis for treatment or other patient management decisions.  A negative result may occur with improper specimen collection / handling, submission of specimen other than nasopharyngeal swab, presence of viral mutation(s) within the areas targeted by this assay, and inadequate number of viral copies (<250 copies / mL). A negative result must be combined with clinical observations, patient history, and epidemiological information.  Fact Sheet for Patients:   StrictlyIdeas.no  Fact Sheet for Healthcare Providers: BankingDealers.co.za  This test is not yet approved or  cleared by the [place] FDA and has been authorized for detection and/or diagnosis of SARS-CoV-2 by FDA under an Emergency Use Authorization (EUA).  This EUA will remain in effect (meaning this test can be used) for the duration of the COVID-19 declaration under Section 564(b)(1) of the  Act, 21 U.S.C. section 360bbb-3(b)(1), unless the authorization is terminated or revoked sooner.  Performed at [facility], [place] [address] [place], [place] [address]   Urine culture     Status: None   Collection Time: 08/07/19  5:58 AM   Specimen: In/Out Cath Urine  Result Value Ref Range Status   Specimen Description IN/OUT CATH URINE  Final   Special Requests NONE  Final   Culture   Final    NO GROWTH Performed at [facility], [place] [address], [place], [place] [address]    Report Status 08/08/2019 FINAL  Final  Blood Culture (routine x 2)     Status: None (Preliminary result)   Collection Time: 08/07/19  6:05 AM   Specimen: BLOOD RIGHT FOREARM  Result Value Ref Range Status   Specimen Description BLOOD RIGHT FOREARM  Final   Special Requests   Final    BOTTLES DRAWN AEROBIC AND ANAEROBIC Blood Culture adequate volume   Culture   Final    NO GROWTH 3 DAYS Performed at [facility], [place] [address] [place],  [address]    Report Status PENDING  Incomplete  Blood Culture (routine x 2)     Status: None (Preliminary result)   Collection Time: 08/07/19  6:20 AM   Specimen: BLOOD LEFT HAND  Result Value Ref Range Status   Specimen Description BLOOD LEFT HAND  Final   Special Requests  Final    BOTTLES DRAWN AEROBIC AND ANAEROBIC Blood Culture results may not be optimal due to an inadequate volume of blood received in culture bottles   Culture   Final    NO GROWTH 3 DAYS Performed at [facility], [place] [address], [place], [place] [address]    Report Status PENDING  Incomplete  Respiratory Panel by PCR     Status: None   Collection Time: 08/07/19  8:47 AM   Specimen: Nasopharyngeal Swab; Respiratory  Result Value Ref Range Status   Adenovirus NOT DETECTED NOT DETECTED Final   Coronavirus 229E NOT DETECTED NOT DETECTED Final    Comment: (NOTE) The Coronavirus on the Respiratory Panel, DOES NOT test for the novel  Coronavirus (2019 nCoV)     Coronavirus HKU1 NOT DETECTED NOT DETECTED Final   Coronavirus NL63 NOT DETECTED NOT DETECTED Final   Coronavirus OC43 NOT DETECTED NOT DETECTED Final   Metapneumovirus NOT DETECTED NOT DETECTED Final   Rhinovirus / Enterovirus NOT DETECTED NOT DETECTED Final   Influenza A NOT DETECTED NOT DETECTED Final   Influenza B NOT DETECTED NOT DETECTED Final   Parainfluenza Virus 1 NOT DETECTED NOT DETECTED Final   Parainfluenza Virus 2 NOT DETECTED NOT DETECTED Final   Parainfluenza Virus 3 NOT DETECTED NOT DETECTED Final   Parainfluenza Virus 4 NOT DETECTED NOT DETECTED Final   Respiratory Syncytial Virus NOT DETECTED NOT DETECTED Final   Bordetella pertussis NOT DETECTED NOT DETECTED Final   Chlamydophila pneumoniae NOT DETECTED NOT DETECTED Final   Mycoplasma pneumoniae NOT DETECTED NOT DETECTED Final    Comment: Performed at [facility], [place]. [address], [place], [place] [address]    Radiology Studies: No results found.    Callum Wolf T. [place]  If 7PM-7AM, please contact night-coverage [email or web address] Password [facility] 08/10/2019, 12:39 PM

## 2019-08-10 NOTE — Progress Notes (Addendum)
Progress Note  Patient Name: Caleb [name] Date of Encounter: 08/10/2019  Primary Cardiologist: Dr. Johnsie Cancel, MD   Subjective   No specific complaints today. Reports symptoms improvement since admission.   Inpatient Medications    Scheduled Meds: . sodium chloride   Intravenous Once  . aspirin  81 mg Oral Daily  . atorvastatin  80 mg Oral Daily  . budesonide (PULMICORT) nebulizer solution  0.25 mg Nebulization BID  . carvedilol  12.5 mg Oral BID  . folic acid  1 mg Oral Daily  . furosemide  40 mg Oral Daily  . gabapentin  300 mg Oral TID  . LORazepam  0-4 mg Intravenous Q12H  . losartan  25 mg Oral Daily  . multivitamin with minerals  1 tablet Oral Daily  . nicotine  21 mg Transdermal Daily  . pantoprazole  40 mg Oral Daily  . potassium chloride  40 mEq Oral BID  . predniSONE  40 mg Oral Q breakfast  . rivaroxaban  20 mg Oral Q supper  . sodium chloride flush  3 mL Intravenous Q12H  . thiamine  100 mg Oral Daily   Continuous Infusions: . sodium chloride 250 mL (08/09/19 1213)  . azithromycin 500 mg (08/10/19 0549)  . cefTRIAXone (ROCEPHIN)  IV 2 g (08/10/19 0511)  . ferumoxytol     PRN Meds: sodium chloride, acetaminophen, ipratropium-albuterol, ondansetron (ZOFRAN) IV, sodium chloride flush   Vital Signs    Vitals:   08/10/19 0711 08/10/19 0717 08/10/19 0830 08/10/19 1115  BP:  (!) 95/50 (!) 161/50 (!) 132/41  Pulse:  81 73 62  Resp:  16  16  Temp:    98.1 F (36.7 C)  TempSrc:    Oral  SpO2: 92% 91% 94% 97%  Weight:      Height:        Intake/Output Summary (Last 24 hours) at 08/10/2019 1142 Last data filed at 08/10/2019 0600 Gross per 24 hour  Intake 315 ml  Output 1950 ml  Net -1635 ml   Filed Weights   08/08/19 0500 08/09/19 0414 08/10/19 0332  Weight: 66.4 kg 66 kg 66 kg    Physical Exam   General: Well developed, well nourished, NAD Neck: Negative for carotid bruits. No JVD Lungs: Diminished in bilateral bases. Breathing is  unlabored. Cardiovascular: RRR with S1 S2. No murmurs Abdomen: Soft, non-tender, non-distended. No obvious abdominal masses. Extremities: No edema. Radial pulses 2+ bilaterally Neuro: Alert and oriented. No focal deficits. No facial asymmetry. MAE spontaneously. Psych: Responds to questions appropriately with normal affect.    Labs    Chemistry Recent Labs  Lab 08/07/19 0518 08/07/19 0901 08/08/19 0323 08/09/19 0400 08/10/19 0122  NA 124*   < > 129* 132* 129*  K 4.0   < > 3.1* 3.9 4.5  CL 92*   < > 93* 97* 97*  CO2 21*   < > 25 25 25   GLUCOSE 179*   < > 134* 139* 133*  BUN 6*   < > 13 17 18   CREATININE 0.85   < > 0.87 1.00 0.95  CALCIUM 8.8*   < > 8.9 8.6* 8.6*  PROT 6.4*  --   --   --   --   ALBUMIN 3.0*  --   --   --   --   AST 24  --   --   --   --   ALT 11  --   --   --   --  ALKPHOS 53  --   --   --   --   BILITOT 0.8  --   --   --   --   GFRNONAA >60   < > >60 >60 >60  GFRAA >60   < > >60 >60 >60  ANIONGAP 11   < > 11 10 7    < > = values in this interval not displayed.     Hematology Recent Labs  Lab 08/09/19 0400 08/09/19 0400 08/09/19 0950 08/09/19 1634 08/10/19 0122  WBC 9.2  --   --  9.7 6.8  RBC 3.04*  --   --  3.84* 3.77*  HGB 7.1*   < > 7.4* 8.9* 9.1*  HCT 23.4*   < > 24.6* 29.3* 29.2*  MCV 77.0*  --   --  76.3* 77.5*  MCH 23.4*  --   --  23.2* 24.1*  MCHC 30.3  --   --  30.4 31.2  RDW 17.3*  --   --  16.4* 16.3*  PLT 210  --   --  214 207   < > = values in this interval not displayed.    Cardiac EnzymesNo results for input(s): TROPONINI in the last 168 hours. No results for input(s): TROPIPOC in the last 168 hours.   BNP Recent Labs  Lab 08/07/19 0519  BNP 1,092.0*     DDimer No results for input(s): DDIMER in the last 168 hours.   Radiology    No results found.  Telemetry    08/10/19 NSR with HRs 60's - Personally Reviewed  ECG    No new tracing as of 08/10/19- Personally Reviewed  Cardiac Studies   ECHO: 08/07/19:  1.  Left ventricular ejection fraction, by estimation, is 45 to 50%. The  left ventricle has mildly decreased function. The left ventricle  demonstrates global hypokinesis. Left ventricular diastolic parameters are  consistent with Grade II diastolic  dysfunction (pseudonormalization). Elevated left ventricular end-diastolic  pressure.  2. Right ventricular systolic function is moderately reduced. The right  ventricular size is normal. There is normal pulmonary artery systolic  pressure.  3. Left atrial size was mild to moderately dilated.  4. The mitral valve is normal in structure. Mild mitral valve  regurgitation. No evidence of mitral stenosis.  5. Tricuspid valve regurgitation is mild to moderate.  6. The aortic valve is tricuspid. Aortic valve regurgitation is not  visualized. No aortic stenosis is present.  7. The inferior vena cava is normal in size with greater than 50%  respiratory variability, suggesting right atrial pressure of 3 mmHg.   ECHO 07/31/2018:  1. The left ventricle has normal systolic function with an ejection  fraction of 60-65%. The cavity size was normal. There is mildly increased  left ventricular wall thickness. Left ventricular diastolic parameters  were normal.  2. Global longitudinal strain is -20.2%.  3. The right ventricle has normal systolic function. The cavity was  normal. There is no increase in right ventricular wall thickness.  4. The aortic root is normal in size and structure.   Patient Profile     69 y.o. 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 Tamala Julian.  Assessment &  Plan    1. Acute systolic CHF exacerbation: -Pt presented with  worsening dyspnea with acute hypoxic respiratory failure found to have an elevated BNP and CXR consistent with CHF however also with fever, hypoxia and cough. Respiratory and COVID panels were found to be negative.  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 Acute SIRS/acute hypoxic respiratory failure: -On presentation, temp elevated at 101.1 with WBC at 16 and LA at 1.9>>COVID screen negative.  -CXR with bibasilar atelectasis -Respiratory panel obtained>>negrative  -Started on abx with Rocephin and azithromycin  -Management per primary team   3. HTN: -Elevated, 132/41>161/50>95/50 -Continue losartan 25, carvedilol   4. COPD/tobacco use: -Given Solumedrol 125mg  along with DuoNeb -Current everyday tobacco use, 1/2 PPD -Management per primary team    5. PVD: -On chronic Xarelto with hx of prior digit amputation  -No reports of bleeding in stool or urine   6. HLD: -Last LDL, 75 from 12/27/2015 -Continue high intensity statin   7. Anemia: -Hb today, 9.1 today  -ASA started given hx of CAD  8. CAD s/p CABG: -No angina  -Continue ASA, statin, BB  Signed, Kathyrn Drown NP-C [facility] Pager: [phone] 08/10/2019, 11:42 AM     For questions or updates, please contact   Please consult [email or web address] for contact info under Cardiology/STEMI.   Attending Note:   The patient was seen and examined.  Agree with assessment and plan as noted above.  Changes made to the above note as needed.  Patient seen and independently examined with Kathyrn Drown, NP .   We discussed all aspects of the encounter. I agree with the  assessment and plan as stated above.  1.   Acute combined systolic / diastolic CHF .   EF is mildly depressed.  45-50%   Has grade 2 DD  BP has been mildly elevate.  Renal function is stable Will start Losartan 50 mg a day  Had a cough to lisinopril .. he should be fine with losartan   He is already feeling better.    I have spent a total of 40 minutes with patient reviewing hospital  notes , telemetry, EKGs, labs and examining patient as well as establishing an assessment and plan that was discussed with the patient. > 50% of time was spent in direct patient care.    Thayer Headings, Brooke Bonito., MD, [facility] 08/10/2019, 3:55 PM [address]  [place] Pager [phone]

## 2019-08-10 NOTE — Evaluation (Signed)
Physical Therapy Evaluation Patient Details Name: Caleb [name] MRN: [id number] DOB: 1950/03/15 Today's Date: 08/10/2019   History of Present Illness  69yo male c/o difficulty breathing, cough. Hypoxic in the ED and required BiPAP. Covid negative. Admitted wtih SIRS and acute respiratory failure with hypoxia. PMH acute respiratory failure with hypoxia, heart failure, COPD, HTN, CABG, tobacco abuse, R toe amputation  Clinical Impression   Patient received up in chair, pleasant and eager to work with therapy. See below for mobility/assist levels. Overall very weak and tremulous and once on his feet needed fairly constant MinA/RW to maintain balance and safety; had significant SPO2 drop to 79-81% even on 4LPM O2 but recovered well once returned to sitting and with PLB. Definitely not at baseline and would be a high fall risk at this point due to weakness, impaired balance, and gross deconditioning. Left up in chair with all needs met and chair alarm active. Currently recommending CIR for intensive therapies prior to return home.     Follow Up Recommendations CIR;Supervision for mobility/OOB    Equipment Recommendations  Rolling walker with 5" wheels;3in1 (PT)    Recommendations for Other Services       Precautions / Restrictions Precautions Precautions: Fall;Other (comment) Precaution Comments: watch O2/BP/HR Restrictions Weight Bearing Restrictions: No      Mobility  Bed Mobility               General bed mobility comments: OOB in chair  Transfers Overall transfer level: Needs assistance Equipment used: Rolling walker (2 wheeled) Transfers: Sit to/from Stand Sit to Stand: Min assist         General transfer comment: MinA to boost to full standing position, needed cues for hand placement and sequencing as well as extra time to steady once standing  Ambulation/Gait Ambulation/Gait assistance: Min assist Gait Distance (Feet): 16 Feet Assistive device: Rolling walker (2  wheeled) Gait Pattern/deviations: Step-through pattern;Decreased step length - right;Decreased step length - left;Trendelenburg;Drifts right/left;Trunk flexed Gait velocity: decreased   General Gait Details: slow and tremulous with RW, generally needed consistent MinA to maintain balance and safety during gait, easily fatigued and SPO2 dropped to 79-81% even on 4LPM O2  Stairs            Wheelchair Mobility    Modified Rankin (Stroke Patients Only)       Balance Overall balance assessment: Needs assistance Sitting-balance support: Bilateral upper extremity supported Sitting balance-Leahy Scale: Fair Sitting balance - Comments: min gaurd to maintain static midline sitting, did not challenge balance   Standing balance support: Bilateral upper extremity supported;During functional activity Standing balance-Leahy Scale: Poor Standing balance comment: constant MinA to maintain balance even with RW                             Pertinent Vitals/Pain Pain Assessment: No/denies pain    Home Living Family/patient expects to be discharged to:: Private residence Living Arrangements: Alone Available Help at Discharge: Family;Available PRN/intermittently Type of Home: Mobile home Home Access: Stairs to enter Entrance Stairs-Rails: Can reach both Entrance Stairs-Number of Steps: 3 Home Layout: One level Home Equipment: Walker - 2 wheels;Cane - single point;Crutches Additional Comments: daughter lives next door and runs a Insurance claims handler business out of her house; has DME but doesn't use it as it is from prior surgeries    Prior Function Level of Independence: Independent               Hand Dominance  Extremity/Trunk Assessment   Upper Extremity Assessment Upper Extremity Assessment: Generalized weakness    Lower Extremity Assessment Lower Extremity Assessment: Generalized weakness    Cervical / Trunk Assessment Cervical / Trunk Assessment: Kyphotic   Communication   Communication: No difficulties  Cognition Arousal/Alertness: Awake/alert Behavior During Therapy: WFL for tasks assessed/performed Overall Cognitive Status: Within Functional Limits for tasks assessed                                 General Comments: seems fairly awake and alert, able to follow commands and answer PLOF questions, but often states "I'm not sure yet" to questions such as DC setting or if he'd like to be in the bed or chair at [place] comments (skin integrity, edema, etc.): SPO2 drop to 79-81% even on 4LPM O2 with short bout of gait    Exercises     Assessment/Plan    PT Assessment Patient needs continued PT services  PT Problem List Decreased strength;Decreased knowledge of use of DME;Decreased activity tolerance;Decreased safety awareness;Decreased balance;Decreased mobility;Decreased coordination       PT Treatment Interventions DME instruction;Balance training;Gait training;Stair training;Functional mobility training;Patient/family education;Therapeutic activities;Therapeutic exercise    PT Goals (Current goals can be found in the Care Plan section)  Acute Rehab PT Goals Patient Stated Goal: get stronger, see how he does as he gets better PT Goal Formulation: With patient Time For Goal Achievement: 08/24/19 Potential to Achieve Goals: Good    Frequency Min 3X/week   Barriers to discharge        Co-evaluation               AM-PAC PT "6 Clicks" Mobility  Outcome Measure Help needed turning from your back to your side while in a flat bed without using bedrails?: A Little Help needed moving from lying on your back to sitting on the side of a flat bed without using bedrails?: A Lot Help needed moving to and from a bed to a chair (including a wheelchair)?: A Little Help needed standing up from a chair using your arms (e.g., wheelchair or bedside chair)?: A Little Help needed to walk in hospital  room?: A Lot Help needed climbing 3-5 steps with a railing? : A Lot 6 Click Score: 15    End of Session Equipment Utilized During Treatment: Gait belt;Oxygen Activity Tolerance: Patient limited by fatigue Patient left: in chair;with call bell/phone within reach;with chair alarm set Nurse Communication: Mobility status PT Visit Diagnosis: Unsteadiness on feet (R26.81);Difficulty in walking, not elsewhere classified (R26.2);Muscle weakness (generalized) (M62.81)    Time: 4356-8616 PT Time Calculation (min) (ACUTE ONLY): 24 min   Charges:   PT Evaluation $PT Eval Moderate Complexity: 1 Mod PT Treatments $Gait Training: 8-22 mins       Windell Norfolk, DPT, PN1   Supplemental Physical Therapist [place]    Pager [phone] Acute Rehab Office [phone]

## 2019-08-10 NOTE — Progress Notes (Signed)
Inpatient Rehab Admissions Coordinator Note:   Per PT recommendation, pt was screened for CIR candidacy by Gayland Curry, MS, CCC-SLP.  At this time we are recommending an inpatient rehab consult.  AC will place consult order per protocol.  Please contact me with questions.    Gayland Curry, MS, CCC-SLP Admissions Coordinator [phone] 08/10/19 4:15 PM

## 2019-08-10 NOTE — Progress Notes (Signed)
PT Cancellation Note  Patient Details Name: LOUAY MYRIE MRN: [id number] DOB: [birth year]   Cancelled Treatment:    Reason Eval/Treat Not Completed: Other (comment) patient eating lunch which had arrived late, requests that PT return later. Will attempt to return if time/schedule allow.    Windell Norfolk, DPT, PN1   Supplemental Physical Therapist [facility]    Pager [phone] Acute Rehab Office [phone]

## 2019-08-10 NOTE — TOC CAGE-AID Note (Signed)
Transition of Care [facility]) - CAGE-AID Screening   Patient Details  Name: Caleb [name] MRN: [id number] Date of Birth: Jan 09, 1951  [facility] CM/SW Contact:    Emeterio Reeve, [place] Phone Number: 08/10/2019, 4:14 PM   Clinical Narrative:  Pt reported that he drinks about a 6pack of beer a day. Pt reports he may have a problem and wants to stop. Pt denies substance use. Pt was receptive to resources and Scientist, clinical (histocompatibility and immunogenetics).   CAGE-AID Screening:    Have You Ever Felt You Ought to Cut Down on Your Drinking or Drug Use?: Yes Have People Annoyed You By Critizing Your Drinking Or Drug Use?: Yes Have You Felt Bad Or Guilty About Your Drinking Or Drug Use?: Yes Have You Ever Had a Drink or Used Drugs First Thing In The Morning to Steady Your Nerves or to Get Rid of a Hangover?: No CAGE-AID Score: 3  Substance Abuse Education Offered: Yes  Substance abuse interventions: Patient Counseling, Educational Materials   Emeterio Reeve, Latanya Presser, [place] Social Worker [phone]

## 2019-08-11 ENCOUNTER — Inpatient Hospital Stay (HOSPITAL_COMMUNITY): Payer: Medicare HMO

## 2019-08-11 DIAGNOSIS — J189 Pneumonia, unspecified organism: Secondary | ICD-10-CM | POA: Diagnosis not present

## 2019-08-11 DIAGNOSIS — Z7901 Long term (current) use of anticoagulants: Secondary | ICD-10-CM | POA: Diagnosis not present

## 2019-08-11 DIAGNOSIS — J9601 Acute respiratory failure with hypoxia: Secondary | ICD-10-CM | POA: Diagnosis not present

## 2019-08-11 LAB — CBC
HCT: 30.9 % — ABNORMAL LOW (ref 39.0–52.0)
Hemoglobin: 9.4 g/dL — ABNORMAL LOW (ref 13.0–17.0)
MCH: 23.6 pg — ABNORMAL LOW (ref 26.0–34.0)
MCHC: 30.4 g/dL (ref 30.0–36.0)
MCV: 77.6 fL — ABNORMAL LOW (ref 80.0–100.0)
Platelets: 223 10*3/uL (ref 150–400)
RBC: 3.98 MIL/uL — ABNORMAL LOW (ref 4.22–5.81)
RDW: 16.5 % — ABNORMAL HIGH (ref 11.5–15.5)
WBC: 6.3 10*3/uL (ref 4.0–10.5)
nRBC: 0 % (ref 0.0–0.2)

## 2019-08-11 LAB — RENAL FUNCTION PANEL
Albumin: 2.2 g/dL — ABNORMAL LOW (ref 3.5–5.0)
Anion gap: 8 (ref 5–15)
BUN: 17 mg/dL (ref 8–23)
CO2: 24 mmol/L (ref 22–32)
Calcium: 9 mg/dL (ref 8.9–10.3)
Chloride: 98 mmol/L (ref 98–111)
Creatinine, Ser: 0.8 mg/dL (ref 0.61–1.24)
GFR calc Af Amer: >60 mL/min (ref >60)
GFR calc non Af Amer: >60 mL/min (ref >60)
Glucose, Bld: 127 mg/dL — ABNORMAL HIGH (ref 70–99)
Phosphorus: 2.6 mg/dL (ref 2.5–4.6)
Potassium: 4.4 mmol/L (ref 3.5–5.1)
Sodium: 130 mmol/L — ABNORMAL LOW (ref 135–145)

## 2019-08-11 LAB — TSH: TSH: 3.44 u[IU]/mL (ref 0.350–4.500)

## 2019-08-11 LAB — MAGNESIUM: Magnesium: 2.2 mg/dL (ref 1.7–2.4)

## 2019-08-11 LAB — CORTISOL-AM, BLOOD: Cortisol - AM: 6.5 ug/dL — ABNORMAL LOW (ref 6.7–22.6)

## 2019-08-11 MED ORDER — CARVEDILOL 25 MG PO TABS
25.0000 mg | ORAL_TABLET | Freq: Two times a day (BID) | ORAL | Status: DC
  Administered 2019-08-11 – 2019-08-16 (×11): 25 mg via ORAL
  Filled 2019-08-11 (×11): qty 1

## 2019-08-11 MED ORDER — SPIRONOLACTONE 25 MG PO TABS
25.0000 mg | ORAL_TABLET | Freq: Every day | ORAL | Status: DC
  Administered 2019-08-11 – 2019-08-16 (×6): 25 mg via ORAL
  Filled 2019-08-11 (×6): qty 1

## 2019-08-11 MED ORDER — LOSARTAN POTASSIUM 50 MG PO TABS
50.0000 mg | ORAL_TABLET | Freq: Every day | ORAL | Status: DC
  Administered 2019-08-11 – 2019-08-12 (×2): 50 mg via ORAL
  Filled 2019-08-11 (×2): qty 1

## 2019-08-11 NOTE — Evaluation (Signed)
Occupational Therapy Evaluation Patient Details Name: Caleb [name] MRN: [id number] DOB: April 08, 1950 Today's Date: 08/11/2019    History of Present Illness 69yo male c/o difficulty breathing, cough. Hypoxic in the ED and required BiPAP. Covid negative. Admitted wtih SIRS and acute respiratory failure with hypoxia. PMH acute respiratory failure with hypoxia, heart failure, COPD, HTN, CABG, tobacco abuse, R toe amputation   Clinical Impression   PTA, pt lives alone and reports Independence with ADLs, IADLs, and working full time in [employer]. Pt did not use DME for daily mobility. Pt presents now with diagnoses above and deficits in cardiopulmonary tolerance, strength, dynamic standing balance. Due to these deficits, pt requires up to [place] for LB ADLs and short distance mobility. Physical assistance required to maintain balance in standing with RW. Pt weaned to 2 L O2 this AM (did not wear O2 at baseline) with desats to 86%, but quick recovery with energy conservation strategy implementation. Pt would benefit from further energy conservation strategy education and increasing overall endurance during ADLs/mobility to maximize independence. Based on pt's high PLOF, recommend CIR for intensive rehab services.     Follow Up Recommendations  CIR;Supervision/Assistance - 24 hour    Equipment Recommendations  Other (comment) (TBD)    Recommendations for Other Services       Precautions / Restrictions Precautions Precautions: Fall;Other (comment) Precaution Comments: watch O2  Restrictions Weight Bearing Restrictions: No      Mobility Bed Mobility Overal bed mobility: Needs Assistance Bed Mobility: Supine to Sit     Supine to sit: Supervision;HOB elevated     General bed mobility comments: Supervision, no physical assist needed  Transfers Overall transfer level: Needs assistance Equipment used: Rolling walker (2 wheeled) Transfers: Sit to/from [facility] Sit to  Stand: Min assist Stand pivot transfers: Min assist       General transfer comment: Min A for initial boost from bed, progressing to min guard during session with cues for safe hand placement. Pt Min A for stand pivot with assistance to maintain balance and manage lines safely    Balance Overall balance assessment: Needs assistance Sitting-balance support: Bilateral upper extremity supported Sitting balance-Leahy Scale: Good Sitting balance - Comments: Able to bend to reach B feet   Standing balance support: Bilateral upper extremity supported;During functional activity Standing balance-Leahy Scale: Poor Standing balance comment: Reliant on UE support, Min A to maintain standing balance from therapist due to shakiness worsening with fatigue                           ADL either performed or assessed with clinical judgement   ADL Overall ADL's : Needs assistance/impaired Eating/Feeding: Independent;Sitting   Grooming: Minimal assistance;Standing;Wash/dry face;Brushing hair Grooming Details (indicate cue type and reason): Min A for grooming standing at sink due to hands on assist needed to maintain stability  Upper Body Bathing: Supervision/ safety;Sitting   Lower Body Bathing: Minimal assistance;Sit to/from stand   Upper Body Dressing : Minimal assistance;Sitting Upper Body Dressing Details (indicate cue type and reason): Min A to don hospital gown around back sitting EOB Lower Body Dressing: Minimal assistance;Sit to/from stand Lower Body Dressing Details (indicate cue type and reason): Overall Min A due to endurance deficits. pt demo ability to reach socks and bring feet to self for LB dressing. Educated on energy conervation strategies Toilet Transfer: Minimal assistance;Ambulation;BSC;RW Toilet Transfer Details (indicate cue type and reason): simulated to sink, recliner. Min A to maintain stability and  cueing for walker mgmt Toileting- Clothing Manipulation and  Hygiene: Sit to/from stand;Minimal assistance       Functional mobility during ADLs: Minimal assistance;Rolling walker;Cueing for sequencing General ADL Comments: Pt limited by decreased cardiopulmonary tolerance (on 2 L O2 today  - did not wear at baseline), decreased strength, decreased standing balance impacting ability to complete ADLs/mobility     Vision Baseline Vision/History: Wears glasses Wears Glasses: Reading only Patient Visual Report: No change from baseline;Other (comment) (hx of cataract surgery) Vision Assessment?: No apparent visual deficits     Perception     Praxis      Pertinent Vitals/Pain Pain Assessment: No/denies pain     Hand Dominance Right   Extremity/Trunk Assessment Upper Extremity Assessment Upper Extremity Assessment: Generalized weakness   Lower Extremity Assessment Lower Extremity Assessment: Defer to PT evaluation   Cervical / Trunk Assessment Cervical / Trunk Assessment: Kyphotic   Communication Communication Communication: No difficulties   Cognition Arousal/Alertness: Awake/alert Behavior During Therapy: WFL for tasks assessed/performed;Flat affect Overall Cognitive Status: Within Functional Limits for tasks assessed                                 General Comments: Pt A&Ox4. Requires some safety cues for hand placement during movement with reinforcement education needed. Pt understands he would not be safe at home alone today   General Comments  At start of OT eval, RN present to switch to regular [place] and wean to 2 L O2. Pt desats to 86% on 2 L O2 while standing at sink for grooming tasks, quickly recovers (within 10 seconds) to 90% with pursed lip breathing cues and rest breaks. Assessed BP at end of session at 173/58 (RN had given meds at start of session with BP elevated)    Exercises     Shoulder Instructions      Home Living Family/patient expects to be discharged to:: Private residence Living Arrangements:  Alone Available Help at Discharge: Family;Available PRN/intermittently Type of Home: Mobile home Home Access: Stairs to enter Entrance Stairs-Number of Steps: 3-4 Entrance Stairs-Rails: Can reach both Home Layout: One level     Bathroom Shower/Tub: Tub/shower unit;Walk-in shower (typically uses walk-in)   Bathroom Toilet: Standard     Home Equipment: Environmental consultant - 2 wheels;Cane - single point;Crutches;Shower seat   Additional Comments: daughter lives next door and runs a Insurance claims handler business out of her house; has DME but doesn't use it as it is from prior surgeries      Prior Functioning/Environment Level of Independence: Independent        Comments: Pt reports Independence with ADLs, IADLs and works full time [employer]. Pt did not use AD for mobility        OT Problem List: Decreased strength;Impaired balance (sitting and/or standing);Decreased activity tolerance;Decreased knowledge of use of DME or AE;Cardiopulmonary status limiting activity      OT Treatment/Interventions: Self-care/ADL training;Therapeutic exercise;Energy conservation;DME and/or AE instruction;Therapeutic activities;Patient/family education    OT Goals(Current goals can be found in the care plan section) Acute Rehab OT Goals Patient Stated Goal: get better and go home OT Goal Formulation: With patient Time For Goal Achievement: 08/25/19 Potential to Achieve Goals: Good ADL Goals Pt Will Perform Grooming: with supervision;standing Pt Will Perform Lower Body Dressing: with supervision;sit to/from stand Pt Will Transfer to Toilet: with supervision;ambulating;bedside commode Pt Will Perform Toileting - Clothing Manipulation and hygiene: with supervision;sit to/from stand Pt/caregiver will Perform Home Exercise  Program: Increased strength;Both right and left upper extremity;With theraband;Independently;With written HEP provided Additional ADL Goal #1: Pt to verbalize at least 3 energy conservation strategies  to maximize ADL/IADL performance  OT Frequency: Min 2X/week   Barriers to D/C:            Co-evaluation              AM-PAC OT "6 Clicks" Daily Activity     Outcome Measure Help from another person eating meals?: None Help from another person taking care of personal grooming?: A Little Help from another person toileting, which includes using toliet, bedpan, or urinal?: A Little Help from another person bathing (including washing, rinsing, drying)?: A Little Help from another person to put on and taking off regular upper body clothing?: A Little Help from another person to put on and taking off regular lower body clothing?: A Little 6 Click Score: 19   End of Session Equipment Utilized During Treatment: Gait belt;Rolling walker;Oxygen Nurse Communication: Mobility status  Activity Tolerance: Patient tolerated treatment well Patient left: in chair;with call bell/phone within reach;with chair alarm set  OT Visit Diagnosis: Unsteadiness on feet (R26.81);Other abnormalities of gait and mobility (R26.89);Muscle weakness (generalized) (M62.81);Other (comment) (Decreased cardiopulmonary tolerance)                Time: 1638-4665 OT Time Calculation (min): 34 min Charges:  OT General Charges $OT Visit: 1 Visit OT Evaluation $OT Eval Moderate Complexity: 1 Mod OT Treatments $Self Care/Home Management : 8-22 mins  Layla Maw, OTR/L  Layla Maw 08/11/2019, 10:11 AM

## 2019-08-11 NOTE — Progress Notes (Signed)
Inpatient Rehabilitation-Admissions Coordinator   CIR consult received. Met with pt bedside for rehab assessment. Feel pt is an appropriate candidate for CIR at this time. Noted he was Independent and working full time PTA and feel with intensive rehab he can return to a Mod I level. We discussed expectations of a CIR stay, expected LOS, and functional outcomes. Pt is open to a rehab stay if his insurance approves. AC will begin insurance authorization process for possible admit.   Please call if questions.   Raechel Ache, OTR/L  Rehab Admissions Coordinator  [phone] 08/11/2019 1:35 PM

## 2019-08-11 NOTE — Progress Notes (Signed)
[name]:[id number] DOB: Feb 22, 1950   PCP: Christain Sacramento, MD  Patient is from: Home.  Lives alone.  Ambulates independently at baseline.  DOA: 08/07/2019 LOS: 4  Brief Narrative / Interim history: 69 year old male with history of CAD/CABG, COPD not on oxygen, CHF, PVD, HTN, HLD, tobacco abuse and alcohol abuse presenting with shortness of breath, cough, wheezing, intermittent diaphoresis, emesis, malaise and fever for 2 days prior to arrival.  In route with EMS, desaturated to 70s.  He received Solu-Medrol, DuoNeb and 2 sublingual nitro with improvement in his symptoms.  He was admitted for acute respiratory failure with hypoxia due to acute CHF, COPD and possible pneumonia.   Subjective: Seen and examined earlier this morning.  No major events overnight or this morning.  Somewhat sleepy but awakes to voice easily.  No complaints other than lack of sleep.  He denies chest pain, shortness of breath, GI or UTI symptoms.  Objective: Vitals:   08/10/19 2344 08/11/19 0400 08/11/19 0724 08/11/19 0730  BP: (!) 177/66   (!) 164/59  Pulse: 64 60  71  Resp: 18 18  14   Temp: 98.3 F (36.8 C) 98 F (36.7 C)  98.6 F (37 C)  TempSrc: Oral Oral  Oral  SpO2: 97% 98% 97%   Weight:      Height:        Intake/Output Summary (Last 24 hours) at 08/11/2019 1057 Last data filed at 08/11/2019 0900 Gross per 24 hour  Intake 120 ml  Output 1050 ml  Net -930 ml   Filed Weights   08/08/19 0500 08/09/19 0414 08/10/19 0332  Weight: 66.4 kg 66 kg 66 kg    Examination:  GENERAL: No apparent distress.  Nontoxic. HEENT: MMM.  Vision and hearing grossly intact.  NECK: Supple.  No apparent JVD.  RESP: 98% on 4 L.  No IWOB.  Diminished aeration partly due to poor inspiratory effort. CVS:  RRR. Heart sounds normal.  ABD/GI/GU: BS+. Abd soft, NTND.  MSK/EXT:  Moves extremities.  RLE> LLE (chronic). SKIN: no apparent skin lesion or wound NEURO: Sleepy but wakes to voice easily.   Fairly oriented.  No apparent focal neuro deficit. PSYCH: Calm. Normal affect.  Procedures:  None  Microbiology summarized: COVID-19 PCR negative. Respiratory viral panel negative. Urine cultures negative. Blood cultures negative.  Assessment & Plan: Acute respiratory failure with hypoxia-multifactorial including CHF, pneumonia, anemia and COPD exacerbation. Required BiPAP on admission.  Now down to 4 L by [place].  Not on oxygen at home.  Lung exam with diminished aeration probably due to poor respiratory effort. -Treat CHF, pneumonia, anemia and COPD as below -Wean oxygen as able.  Minimum oxygen to keep saturation above 90% given history of COPD -OOB/IS/PT/OT  Sepsis likely due to community-acquired pneumonia-POA.  He was febrile with leukocytosis on presentation.  COVID-19 PCR, RVP and blood cultures negative.  Procalcitonin elevated.  Sepsis physiology resolved.  -Rocephin and azithromycin 7/23> to complete 5 days course -IS/OOB  COPD exacerbation: Had cardinal symptoms on presentation.  -Continue prednisone, budesonide and nebulizers. -Minimal oxygen to keep saturation above 90%.  Acute on chronic systolicCHF: Some evidence of fluid overload with elevated BNP to 1100 and concerning CXR.  Echo eoyj EF of 45 to 50% and global hypokinesis (60 to 65% in 07/2018).  Currently appears euvolemic except for chronic RLE swelling.  I&O incomplete.  Net negative.  Renal function stable. -On p.o. Lasix 40 mg daily per cardiology -Monitor fluid status, renal function  and electrolytes -Follow further cardiology recommendations  Elevated troponin: felt to be demand ischemia in the setting of the above.  No chest pain or anginal symptoms. -On aspirin per cardiology  Iron deficiency anemia: Baseline Hgb 8-9>7.6 (admit)> 7.1>1u> 8.9> 9.4. Iron 7. Iron sat 2%. Colo in 10/2018 with 22 to 30 mm sessile polyps that were resected.  EGD revealed gastritis with hemorrhage and sessile polyps in duodenum.   Repeat colonoscopy is recommended but didn't seem to follow up.  He denies melena or hematochezia.  He is on high dose Xarelto for PVD. No Hx of PE/DVT or A. fib.  He is also on aspirin. -Monitor H&H -Reduced Xarelto to 2.5 mg.  Recommended dose for PVD. -IV Feraheme on 7/26.  Give repeat dose in 3 to 4 days if here -P.o. iron with bowel regimen on discharge. -Need outpatient GI follow-up with Dr. Benson Norway for repeat colonoscopy  Acute on chronic hyponatremia:Na 124 on admit >> 132 > 130. Urine Na slightly high at 43.  Urine also 140.  Likely due to fluid overload and alcohol.  Cortisol slightly low.  TSH was normal. -Continue monitoring  Hypokalemia-likely secondary to diuretics and possibly alcohol.  Resolved. -Monitor as appropriate  Essential hypertension: Seems to have labile BP -Continueon Coreg and Lasix per cardiology -Increase losartan from 25 to 50 mg daily -Continue holding amlodipine  Peripheral vascular disease: patient with prior history of right first digit amputation in 2019. -Continue statin and aspirin. -Reduced Xarelto to 2.5 mg daily  CAD: Patient with prior history of three-vessel CABG back in 2016. -Continue statin and aspirin  Hyperlipidemia: patient on atorvastatin 80 mg nightly. -Continue atorvastatin  Tobacco abuse: Smokes about 10 cigarettes a day -Counseled on the need for cessation of tobacco use -Nicotine patch offered  Alcohol abuse: Patient reports drinkingfour12 ounce beers per day on average. -CIWA protocol was initiated -No withdrawal symptoms. -Continue vitamins  History of gastritis: As noted on last enteroscopy by Dr. Benson Norway from 10/2018. -Protonix 40 mg daily   Body mass index is 24.98 kg/m.         DVT prophylaxis:   rivaroxaban (XARELTO) tablet 2.5 mg    Code Status: Full code Family Communication: Patient and/or RN. Available if any question.  Status is: Inpatient  Remains inpatient appropriate  because:Hemodynamically unstable, Inpatient level of care appropriate due to severity of illness and Hypoxemia requiring supplemental oxygen   Dispo: The patient is from: Home              Anticipated d/c is to: [facility]              Anticipated d/c date is: 2 days              Patient currently is not medically stable to d/c.       Consultants:  Cardiology   Sch Meds:  Scheduled Meds: . sodium chloride   Intravenous Once  . aspirin  81 mg Oral Daily  . atorvastatin  80 mg Oral Daily  . budesonide (PULMICORT) nebulizer solution  0.5 mg Nebulization BID  . folic acid  1 mg Oral Daily  . furosemide  40 mg Oral Daily  . gabapentin  300 mg Oral TID  . losartan  50 mg Oral Daily  . metoprolol tartrate  50 mg Oral BID  . multivitamin with minerals  1 tablet Oral Daily  . nicotine  21 mg Transdermal Daily  . pantoprazole  40 mg Oral Daily  . predniSONE  40  mg Oral Q breakfast  . rivaroxaban  2.5 mg Oral BID  . sodium chloride flush  3 mL Intravenous Q12H  . thiamine  100 mg Oral Daily   Continuous Infusions: . sodium chloride 250 mL (08/09/19 1213)  . azithromycin 500 mg (08/11/19 0523)  . cefTRIAXone (ROCEPHIN)  IV 2 g (08/11/19 0418)   PRN Meds:.sodium chloride, acetaminophen, ipratropium-albuterol, LORazepam **OR** LORazepam, ondansetron (ZOFRAN) IV, sodium chloride flush  Antimicrobials: Anti-infectives (From admission, onward)   Start     Dose/Rate Route Frequency Ordered Stop   08/07/19 0600  cefTRIAXone (ROCEPHIN) 2 g in sodium chloride 0.9 % 100 mL IVPB     Discontinue     2 g 200 mL/hr over 30 Minutes Intravenous Every 24 hours 08/07/19 0550     08/07/19 0600  azithromycin (ZITHROMAX) 500 mg in sodium chloride 0.9 % 250 mL IVPB     Discontinue     500 mg 250 mL/hr over 60 Minutes Intravenous Every 24 hours 08/07/19 0550         I have personally reviewed the following labs and images: CBC: Recent Labs  Lab 08/07/19 0518 08/07/19 0901 08/08/19 0323  08/08/19 0323 08/09/19 0400 08/09/19 0950 08/09/19 1634 08/10/19 0122 08/11/19 0102  WBC 16.0*   < > 10.4  --  9.2  --  9.7 6.8 6.3  NEUTROABS 14.1*  --  9.6*  --   --   --   --   --   --   HGB 7.6*   < > 7.7*   < > 7.1* 7.4* 8.9* 9.1* 9.4*  HCT 25.2*   < > 25.0*   < > 23.4* 24.6* 29.3* 29.2* 30.9*  MCV 79.0*   < > 77.2*  --  77.0*  --  76.3* 77.5* 77.6*  PLT 228   < > 216  --  210  --  214 207 223   < > = values in this interval not displayed.   BMP &GFR Recent Labs  Lab 08/07/19 0901 08/07/19 1418 08/08/19 0323 08/09/19 0400 08/10/19 0122 08/11/19 0102  NA 125*  --  129* 132* 129* 130*  K 3.5  --  3.1* 3.9 4.5 4.4  CL 92*  --  93* 97* 97* 98  CO2 21*  --  25 25 25 24   GLUCOSE 144*  --  134* 139* 133* 127*  BUN 8  --  13 17 18 17   CREATININE 0.81  --  0.87 1.00 0.95 0.80  CALCIUM 9.0  --  8.9 8.6* 8.6* 9.0  MG  --  1.9  --  2.1 2.0 2.2  PHOS  --  3.2  --   --   --  2.6   Estimated Creatinine Clearance: 71.9 mL/min (by C-G formula based on SCr of 0.8 mg/dL). Liver & Pancreas: Recent Labs  Lab 08/07/19 0518 08/11/19 0102  AST 24  --   ALT 11  --   ALKPHOS 53  --   BILITOT 0.8  --   PROT 6.4*  --   ALBUMIN 3.0* 2.2*   No results for input(s): LIPASE, AMYLASE in the last 168 hours. No results for input(s): AMMONIA in the last 168 hours. Diabetic: No results for input(s): HGBA1C in the last 72 hours. No results for input(s): GLUCAP in the last 168 hours. Cardiac Enzymes: No results for input(s): CKTOTAL, CKMB, CKMBINDEX, TROPONINI in the last 168 hours. No results for input(s): PROBNP in the last 8760 hours. Coagulation Profile: Recent Labs  Lab 08/07/19 0630  INR 2.0*   Thyroid Function Tests: Recent Labs    08/11/19 0102  TSH 3.440   Lipid Profile: No results for input(s): CHOL, HDL, LDLCALC, TRIG, CHOLHDL, LDLDIRECT in the last 72 hours. Anemia Panel: No results for input(s): VITAMINB12, FOLATE, FERRITIN, TIBC, IRON, RETICCTPCT in the last 72  hours. Urine analysis:    Component Value Date/Time   COLORURINE YELLOW 08/07/2019 0840   APPEARANCEUR CLEAR 08/07/2019 0840   LABSPEC 1.015 08/07/2019 0840   PHURINE 6.0 08/07/2019 0840   GLUCOSEU 50 (A) 08/07/2019 0840   HGBUR NEGATIVE 08/07/2019 0840   BILIRUBINUR NEGATIVE 08/07/2019 0840   KETONESUR NEGATIVE 08/07/2019 0840   PROTEINUR 100 (A) 08/07/2019 0840   UROBILINOGEN 0.2 02/13/2014 1645   NITRITE NEGATIVE 08/07/2019 0840   LEUKOCYTESUR NEGATIVE 08/07/2019 0840   Sepsis Labs: Invalid input(s): PROCALCITONIN, [place]  Microbiology: Recent Results (from the past 240 hour(s))  SARS Coronavirus 2 by RT PCR (hospital order, performed in [facility] hospital lab) Nasopharyngeal Nasopharyngeal Swab     Status: None   Collection Time: 08/07/19  5:06 AM   Specimen: Nasopharyngeal Swab  Result Value Ref Range Status   SARS Coronavirus 2 NEGATIVE NEGATIVE Final    Comment: (NOTE) SARS-CoV-2 target nucleic acids are NOT DETECTED.  The SARS-CoV-2 RNA is generally detectable in upper and lower respiratory specimens during the acute phase of infection. The lowest concentration of SARS-CoV-2 viral copies this assay can detect is 250 copies / mL. A negative result does not preclude SARS-CoV-2 infection and should not be used as the sole basis for treatment or other patient management decisions.  A negative result may occur with improper specimen collection / handling, submission of specimen other than nasopharyngeal swab, presence of viral mutation(s) within the areas targeted by this assay, and inadequate number of viral copies (<250 copies / mL). A negative result must be combined with clinical observations, patient history, and epidemiological information.  Fact Sheet for Patients:   StrictlyIdeas.no  Fact Sheet for Healthcare Providers: BankingDealers.co.za  This test is not yet approved or  cleared by the [place]  FDA and has been authorized for detection and/or diagnosis of SARS-CoV-2 by FDA under an Emergency Use Authorization (EUA).  This EUA will remain in effect (meaning this test can be used) for the duration of the COVID-19 declaration under Section 564(b)(1) of the Act, 21 U.S.C. section 360bbb-3(b)(1), unless the authorization is terminated or revoked sooner.  Performed at [facility], [place] [address] [place], [place] [address]   Urine culture     Status: None   Collection Time: 08/07/19  5:58 AM   Specimen: In/Out Cath Urine  Result Value Ref Range Status   Specimen Description IN/OUT CATH URINE  Final   Special Requests NONE  Final   Culture   Final    NO GROWTH Performed at [facility], [place] [address] [place], [place] [address]    Report Status 08/08/2019 FINAL  Final  Blood Culture (routine x 2)     Status: None (Preliminary result)   Collection Time: 08/07/19  6:05 AM   Specimen: BLOOD RIGHT FOREARM  Result Value Ref Range Status   Specimen Description BLOOD RIGHT FOREARM  Final   Special Requests   Final    BOTTLES DRAWN AEROBIC AND ANAEROBIC Blood Culture adequate volume   Culture   Final    NO GROWTH 3 DAYS Performed at [facility], [place] [address] [place], [place]  [address]    Report Status PENDING  Incomplete  Blood Culture (routine x 2)     Status: None (Preliminary result)   Collection Time: 08/07/19  6:20 AM   Specimen: BLOOD LEFT HAND  Result Value Ref Range Status   Specimen Description BLOOD LEFT HAND  Final   Special Requests   Final    BOTTLES DRAWN AEROBIC AND ANAEROBIC Blood Culture results may not be optimal due to an inadequate volume of blood received in culture bottles   Culture   Final    NO GROWTH 3 DAYS Performed at [facility], [place] [address] [place], [place] [address]    Report Status PENDING  Incomplete  Respiratory Panel by PCR     Status: None   Collection Time: 08/07/19  8:47 AM   Specimen: Nasopharyngeal  Swab; Respiratory  Result Value Ref Range Status   Adenovirus NOT DETECTED NOT DETECTED Final   Coronavirus 229E NOT DETECTED NOT DETECTED Final    Comment: (NOTE) The Coronavirus on the Respiratory Panel, DOES NOT test for the novel  Coronavirus (2019 nCoV)    Coronavirus HKU1 NOT DETECTED NOT DETECTED Final   Coronavirus NL63 NOT DETECTED NOT DETECTED Final   Coronavirus OC43 NOT DETECTED NOT DETECTED Final   Metapneumovirus NOT DETECTED NOT DETECTED Final   Rhinovirus / Enterovirus NOT DETECTED NOT DETECTED Final   Influenza A NOT DETECTED NOT DETECTED Final   Influenza B NOT DETECTED NOT DETECTED Final   Parainfluenza Virus 1 NOT DETECTED NOT DETECTED Final   Parainfluenza Virus 2 NOT DETECTED NOT DETECTED Final   Parainfluenza Virus 3 NOT DETECTED NOT DETECTED Final   Parainfluenza Virus 4 NOT DETECTED NOT DETECTED Final   Respiratory Syncytial Virus NOT DETECTED NOT DETECTED Final   Bordetella pertussis NOT DETECTED NOT DETECTED Final   Chlamydophila pneumoniae NOT DETECTED NOT DETECTED Final   Mycoplasma pneumoniae NOT DETECTED NOT DETECTED Final    Comment: Performed at [facility], [place]. [address], [place], [place] [address]    Radiology Studies: No results found.    Heidy Mccubbin T. [place]  If 7PM-7AM, please contact night-coverage [email or web address] Password [facility] 08/11/2019, 10:57 AM

## 2019-08-11 NOTE — Progress Notes (Addendum)
Progress Note  Patient Name: Caleb [name] Date of Encounter: 08/11/2019  Primary Cardiologist: Dr. Johnsie Cancel, MD   Subjective   No specific complaints today.   Inpatient Medications    Scheduled Meds: . sodium chloride   Intravenous Once  . aspirin  81 mg Oral Daily  . atorvastatin  80 mg Oral Daily  . budesonide (PULMICORT) nebulizer solution  0.5 mg Nebulization BID  . folic acid  1 mg Oral Daily  . furosemide  40 mg Oral Daily  . gabapentin  300 mg Oral TID  . losartan  50 mg Oral Daily  . metoprolol tartrate  50 mg Oral BID  . multivitamin with minerals  1 tablet Oral Daily  . nicotine  21 mg Transdermal Daily  . pantoprazole  40 mg Oral Daily  . predniSONE  40 mg Oral Q breakfast  . rivaroxaban  2.5 mg Oral BID  . sodium chloride flush  3 mL Intravenous Q12H  . thiamine  100 mg Oral Daily   Continuous Infusions: . sodium chloride 250 mL (08/09/19 1213)  . azithromycin 500 mg (08/11/19 0523)  . cefTRIAXone (ROCEPHIN)  IV 2 g (08/11/19 0418)   PRN Meds: sodium chloride, acetaminophen, ipratropium-albuterol, LORazepam **OR** LORazepam, ondansetron (ZOFRAN) IV, sodium chloride flush   Vital Signs    Vitals:   08/10/19 2344 08/11/19 0400 08/11/19 0724 08/11/19 0730  BP: (!) 177/66   (!) 164/59  Pulse: 64 60  71  Resp: 18 18  14   Temp: 98.3 F (36.8 C) 98 F (36.7 C)  98.6 F (37 C)  TempSrc: Oral Oral  Oral  SpO2: 97% 98% 97%   Weight:      Height:        Intake/Output Summary (Last 24 hours) at 08/11/2019 1112 Last data filed at 08/11/2019 0900 Gross per 24 hour  Intake 120 ml  Output 1050 ml  Net -930 ml   Filed Weights   08/08/19 0500 08/09/19 0414 08/10/19 0332  Weight: 66.4 kg 66 kg 66 kg    Physical Exam   Physical Exam: Blood pressure (!) 159/52, pulse 59, temperature 98.2 F (36.8 C), temperature source Oral, resp. rate 13, height 5\' 4"  (1.626 m), weight 66 kg, SpO2 95 %.  GEN:   Chronically ill appearing man  HEENT: Normal NECK: No  JVD; No carotid bruits LYMPHATICS: No lymphadenopathy CARDIAC: RRR , no murmurs, rubs, gallops RESPIRATORY:  Greatly reduced breath sound left lower lung field  ABDOMEN: Soft, non-tender, non-distended MUSCULOSKELETAL:  No edema; No deformity  SKIN: Warm and dry NEUROLOGIC:  Alert and oriented x 3   Labs    Chemistry Recent Labs  Lab 08/07/19 0518 08/07/19 0901 08/09/19 0400 08/10/19 0122 08/11/19 0102  NA 124*   < > 132* 129* 130*  K 4.0   < > 3.9 4.5 4.4  CL 92*   < > 97* 97* 98  CO2 21*   < > 25 25 24   GLUCOSE 179*   < > 139* 133* 127*  BUN 6*   < > 17 18 17   CREATININE 0.85   < > 1.00 0.95 0.80  CALCIUM 8.8*   < > 8.6* 8.6* 9.0  PROT 6.4*  --   --   --   --   ALBUMIN 3.0*  --   --   --  2.2*  AST 24  --   --   --   --   ALT 11  --   --   --   --  ALKPHOS 53  --   --   --   --   BILITOT 0.8  --   --   --   --   GFRNONAA >60   < > >60 >60 >60  GFRAA >60   < > >60 >60 >60  ANIONGAP 11   < > 10 7 8    < > = values in this interval not displayed.     Hematology Recent Labs  Lab 08/09/19 1634 08/10/19 0122 08/11/19 0102  WBC 9.7 6.8 6.3  RBC 3.84* 3.77* 3.98*  HGB 8.9* 9.1* 9.4*  HCT 29.3* 29.2* 30.9*  MCV 76.3* 77.5* 77.6*  MCH 23.2* 24.1* 23.6*  MCHC 30.4 31.2 30.4  RDW 16.4* 16.3* 16.5*  PLT 214 207 223    Cardiac EnzymesNo results for input(s): TROPONINI in the last 168 hours. No results for input(s): TROPIPOC in the last 168 hours.   BNP Recent Labs  Lab 08/07/19 0519  BNP 1,092.0*     DDimer No results for input(s): DDIMER in the last 168 hours.   Radiology    No results found.  Telemetry    08/11/19 NSR- Personally Reviewed  ECG    No new tracing as of 08/11/19- Personally Reviewed  Cardiac Studies   ECHO:08/07/19:  1. Left ventricular ejection fraction, by estimation, is 45 to 50%. The  left ventricle has mildly decreased function. The left ventricle  demonstrates global hypokinesis. Left ventricular diastolic parameters are    consistent with Grade II diastolic  dysfunction (pseudonormalization). Elevated left ventricular end-diastolic  pressure.  2. Right ventricular systolic function is moderately reduced. The right  ventricular size is normal. There is normal pulmonary artery systolic  pressure.  3. Left atrial size was mild to moderately dilated.  4. The mitral valve is normal in structure. Mild mitral valve  regurgitation. No evidence of mitral stenosis.  5. Tricuspid valve regurgitation is mild to moderate.  6. The aortic valve is tricuspid. Aortic valve regurgitation is not  visualized. No aortic stenosis is present.  7. The inferior vena cava is normal in size with greater than 50%  respiratory variability, suggesting right atrial pressure of 3 mmHg.   ECHO 07/31/2018:  1. The left ventricle has normal systolic function with an ejection  fraction of 60-65%. The cavity size was normal. There is mildly increased  left ventricular wall thickness. Left ventricular diastolic parameters  were normal.  2. Global longitudinal strain is -20.2%.  3. The right ventricle has normal systolic function. The cavity was  normal. There is no increase in right ventricular wall thickness.  4. The aortic root is normal in size and structure.  Patient Profile     69 y.o. 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 Tamala Julian.  Assessment & Plan    1.Acute systolic CHF exacerbation: 1.   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 Continue supportive care. - coreg, losartan , lasix  Add spironolactone 25 mg a day    2. Acute SIRS/acute  hypoxic respiratory failure: -On presentation, temp elevated at 101.1 with WBC at 16 and LA at 1.9>>COVID screen negative.  3. CAD - s/p CABG  4.  HTN:  Cont current meds.  Adding spironolactone 25 mg a day    5. PVD: -On chronic Xarelto with hx of prior digit amputation  6.  COPD :   Advised smoking sessation   I have spent a total of 40 minutes with patient reviewing hospital  notes , telemetry, EKGs, labs and examining patient as well as establishing an assessment and plan that was discussed with the patient. > 50% of time was spent in direct patient care.    [place] Headings, Brooke Bonito., MD, [facility] 08/11/2019, 2:08 PM [address],  [place] Pager [phone]

## 2019-08-12 ENCOUNTER — Telehealth: Payer: Self-pay | Admitting: Cardiovascular Disease

## 2019-08-12 DIAGNOSIS — I5023 Acute on chronic systolic (congestive) heart failure: Secondary | ICD-10-CM

## 2019-08-12 DIAGNOSIS — I110 Hypertensive heart disease with heart failure: Secondary | ICD-10-CM

## 2019-08-12 DIAGNOSIS — E785 Hyperlipidemia, unspecified: Secondary | ICD-10-CM

## 2019-08-12 DIAGNOSIS — R6510 Systemic inflammatory response syndrome (SIRS) of non-infectious origin without acute organ dysfunction: Secondary | ICD-10-CM | POA: Diagnosis not present

## 2019-08-12 DIAGNOSIS — I2510 Atherosclerotic heart disease of native coronary artery without angina pectoris: Secondary | ICD-10-CM

## 2019-08-12 LAB — HEMOGLOBIN AND HEMATOCRIT, BLOOD
HCT: 30.3 % — ABNORMAL LOW (ref 39.0–52.0)
Hemoglobin: 9.5 g/dL — ABNORMAL LOW (ref 13.0–17.0)

## 2019-08-12 LAB — RENAL FUNCTION PANEL
Albumin: 2.3 g/dL — ABNORMAL LOW (ref 3.5–5.0)
Anion gap: 10 (ref 5–15)
BUN: 17 mg/dL (ref 8–23)
CO2: 22 mmol/L (ref 22–32)
Calcium: 8.9 mg/dL (ref 8.9–10.3)
Chloride: 97 mmol/L — ABNORMAL LOW (ref 98–111)
Creatinine, Ser: 0.86 mg/dL (ref 0.61–1.24)
GFR calc Af Amer: >60 mL/min (ref >60)
GFR calc non Af Amer: >60 mL/min (ref >60)
Glucose, Bld: 114 mg/dL — ABNORMAL HIGH (ref 70–99)
Phosphorus: 3.5 mg/dL (ref 2.5–4.6)
Potassium: 3.9 mmol/L (ref 3.5–5.1)
Sodium: 129 mmol/L — ABNORMAL LOW (ref 135–145)

## 2019-08-12 LAB — MAGNESIUM: Magnesium: 2.1 mg/dL (ref 1.7–2.4)

## 2019-08-12 MED ORDER — HYDRALAZINE HCL 20 MG/ML IJ SOLN
10.0000 mg | Freq: Four times a day (QID) | INTRAMUSCULAR | Status: DC | PRN
  Administered 2019-08-12 – 2019-08-15 (×5): 10 mg via INTRAVENOUS
  Filled 2019-08-12 (×5): qty 1

## 2019-08-12 MED ORDER — SENNOSIDES-DOCUSATE SODIUM 8.6-50 MG PO TABS
1.0000 | ORAL_TABLET | Freq: Every day | ORAL | Status: DC
  Administered 2019-08-12 – 2019-08-15 (×4): 1 via ORAL
  Filled 2019-08-12 (×4): qty 1

## 2019-08-12 MED ORDER — IRBESARTAN 150 MG PO TABS: 150.0000 mg | ORAL_TABLET | Freq: Every day | ORAL | Status: DC

## 2019-08-12 MED ORDER — IRBESARTAN 150 MG PO TABS
150.0000 mg | ORAL_TABLET | Freq: Once | ORAL | Status: AC
  Administered 2019-08-12: 150 mg via ORAL
  Filled 2019-08-12: qty 1

## 2019-08-12 MED ORDER — FERROUS SULFATE 325 (65 FE) MG PO TABS
325.0000 mg | ORAL_TABLET | Freq: Every day | ORAL | Status: DC
  Administered 2019-08-13 – 2019-08-16 (×4): 325 mg via ORAL
  Filled 2019-08-12 (×4): qty 1

## 2019-08-12 MED ORDER — IRBESARTAN 300 MG PO TABS
300.0000 mg | ORAL_TABLET | Freq: Every day | ORAL | Status: DC
  Administered 2019-08-12 – 2019-08-16 (×5): 300 mg via ORAL
  Filled 2019-08-12 (×6): qty 1

## 2019-08-12 NOTE — Progress Notes (Signed)
PROGRESS NOTE  Caleb [name]  DOB: 11-13-1950  PCP: Christain Sacramento, MD AOZ:[id number]  DOA: 08/07/2019  LOS: 5 days   Chief Complaint  Patient presents with  . Shortness of Breath    BIPAP   Brief narrative: AABAN GRIEP is a 69 y.o. male with chronic smoking, chronic alcohol use, PMH of HTN, HLD, CAD s/p CABG, CHF, COPD not on home oxygen, peripheral artery disease. Patient presented to the ED on 08/07/2019 with complaint of shortness of breath, cough, wheezing, intermittent diaphoresis, emesis, malaise and fever for 2 days prior to arrival.  He was admitted for acute respiratory failure with hypoxia due to acute CHF, COPD and possible pneumonia.   Subjective: Patient was seen and examined this morning. Pleasant elderly male, sitting up in bed.  Not in distress.  Not on supplemental oxygen.  No alcohol withdrawal tremors. Chart reviewed.  Blood pressure seems to be constantly elevated, as high as 200 this morning Labs this morning with sodium 129, potassium 3.9, BUN/creatinine 17/0.86, magnesium 2.1, phosphorus 3.5, hemoglobin 9.5  Assessment/Plan: Acute respiratory failure with hypoxia -multifactorial including CHF, pneumonia, anemia and COPD exacerbation.  -Required BiPAP on admission.    Gradually weaned down, currently not using supplemental oxygen at rest.  Does not use oxygen at home.    Sepsis likely due to community-acquired pneumonia-POA.   COPD exacerbation -Met sepsis criteria on admission with fever, leukocytosis.   -Elevated procalcitonin.  Chest x-ray showed atelectasis but patient's presentation was clinically consistent with pneumonia.  -Completed 5-day course of IV Rocephin and azithromycin. -Continue prednisone, budesonide and nebulizers.  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   -Echo 7/23 with EF of 45 to 50% and global hypokinesis (60 to 65% in 07/2018).   -Cardiology  consult appreciated.  Currently on Lasix 40 mg daily. -Currently appears euvolemic except for chronic RLE swelling.  Renal function stable. -Monitor fluid status, renal function and electrolytes  Elevated troponin:  -felt to be demand ischemia in the setting of the above.  No chest pain or anginal symptoms. -On aspirin per cardiology  Uncontrolled hypertension -Blood pressure elevated to 205/67 this morning. -Home meds include amlodipine 10 mg daily, Coreg 12.5 mg twice daily. -Currently on Coreg 25 mg twice daily, Lasix 40 mg daily, losartan 50 mg daily and Aldactone 25 mg daily. -Cardiology adjusting blood pressure medications.  Use IV hydralazine as needed .  Peripheral vascular disease:  -patient with prior history of right first digit amputation in 2019. -Continue statin and aspirin. -Reduced Xarelto to 2.5 mg daily  CAD:  -Patient with prior history of three-vessel CABG back in 2016. -Continue statin and aspirin  Hyperlipidemia:  -patient is on atorvastatin 80 mg nightly.  Tobacco abuse:  -Smokes about 10 cigarettes a day -Counseled on the need for cessation of tobacco use -Nicotine patch offered  Iron deficiency anemia:  History of gastritis -Baseline Hgb 8 to 9. 7.6 (admit)> 7.1>1u> 8.9> 9.4. Iron 7. Iron sat 2%. Colo in 10/2018 with 22 to 30 mm sessile polyps that were resected.  EGD revealed gastritis with hemorrhage and sessile polyps in duodenum.  Repeat colonoscopy is recommended but didn't seem to follow up.  He denies melena or hematochezia.  He is on high dose Xarelto for PVD. No Hx of PE/DVT or A. fib.  He is also on aspirin. -Remains currently stable, 9.5 today. -Previous hospitalist reduced Xarelto to 2.5 mg.  Recommended  dose for PVD. -Patient was given 1 dose of IV Feraheme on 7/26.    Started oral iron supplement. -Protonix 40 mg daily -Need outpatient GI follow-up with Dr. Benson Norway for repeat colonoscopy  Acute on chronic hyponatremia: -Na 124 on  admit >> 132 > 130. Urine Na slightly high at 43.  -Likely due to fluid overload and alcohol.  Cortisol slightly low.  TSH was normal. -Sodium 149 today.  Alcohol abuse:  -Patient reports drinkingfour12 ounce beers per day on average. -CIWA protocol was initiated -No withdrawal symptoms. -Continue vitamins  Mobility: PT eval obtained.  CIR recommended Code Status:   Code Status: Full Code  Nutritional status: Body mass index is 24.98 kg/m.     Diet Order            Diet Heart Room service appropriate? Yes; Fluid consistency: Thin; Fluid restriction: 1500 mL Fluid  Diet effective now                 DVT prophylaxis:  rivaroxaban (XARELTO) tablet 2.5 mg   Antimicrobials:  None Fluid: None  Consultants: Cardiology Family Communication:  None at bedside  Status is: Inpatient  Remains inpatient appropriate because:Hemodynamically unstable   Dispo: The patient is from: Home              Anticipated d/c is to: CIR              Anticipated d/c date is: 1 day              Patient currently is not medically stable to d/c.  Anticipate blood pressure improvement in next 24 hours and he should be stable for discharge to CIR tomorrow.   Infusions:  . sodium chloride 250 mL (08/09/19 1213)  . azithromycin 500 mg (08/12/19 0555)  . cefTRIAXone (ROCEPHIN)  IV 2 g (08/12/19 0554)    Scheduled Meds: . sodium chloride   Intravenous Once  . aspirin  81 mg Oral Daily  . atorvastatin  80 mg Oral Daily  . budesonide (PULMICORT) nebulizer solution  0.5 mg Nebulization BID  . carvedilol  25 mg Oral BID WC  . folic acid  1 mg Oral Daily  . furosemide  40 mg Oral Daily  . gabapentin  300 mg Oral TID  . losartan  50 mg Oral Daily  . multivitamin with minerals  1 tablet Oral Daily  . nicotine  21 mg Transdermal Daily  . pantoprazole  40 mg Oral Daily  . predniSONE  40 mg Oral Q breakfast  . rivaroxaban  2.5 mg Oral BID  . sodium chloride flush  3 mL Intravenous Q12H  .  spironolactone  25 mg Oral Daily  . thiamine  100 mg Oral Daily    Antimicrobials: Anti-infectives (From admission, onward)   Start     Dose/Rate Route Frequency Ordered Stop   08/07/19 0600  cefTRIAXone (ROCEPHIN) 2 g in sodium chloride 0.9 % 100 mL IVPB     Discontinue     2 g 200 mL/hr over 30 Minutes Intravenous Every 24 hours 08/07/19 0550     08/07/19 0600  azithromycin (ZITHROMAX) 500 mg in sodium chloride 0.9 % 250 mL IVPB     Discontinue     500 mg 250 mL/hr over 60 Minutes Intravenous Every 24 hours 08/07/19 0550        PRN meds: sodium chloride, acetaminophen, hydrALAZINE, ipratropium-albuterol, LORazepam **OR** LORazepam, ondansetron (ZOFRAN) IV, sodium chloride flush   Objective: Vitals:   08/12/19 1118  08/12/19 1126  BP: (!) 205/67 (!) 205/67  Pulse:    Resp:    Temp:    SpO2:      Intake/Output Summary (Last 24 hours) at 08/12/2019 1129 Last data filed at 08/12/2019 1002 Gross per 24 hour  Intake 480 ml  Output 2250 ml  Net -1770 ml   Filed Weights   08/08/19 0500 08/09/19 0414 08/10/19 0332  Weight: 66.4 kg 66 kg 66 kg   Weight change:  Body mass index is 24.98 kg/m.   Physical Exam: General exam: Appears calm and comfortable. = Skin: No rashes, lesions or ulcers. HEENT: Atraumatic, normocephalic, supple neck, no obvious bleeding Lungs: Clear to auscultation bilaterally CVS: Regular rate and rhythm, no murmur GI/Abd soft, nontender, nondistended, bowel sound present CNS: Alert, awake, oriented x3 Psychiatry: Mood appropriate Extremities: No edema, no calf tenderness  Data Review: I have personally reviewed the laboratory data and studies available.  Recent Labs  Lab 08/07/19 0518 08/07/19 0901 08/08/19 0323 08/08/19 0323 08/09/19 0400 08/09/19 0400 08/09/19 0950 08/09/19 1634 08/10/19 0122 08/11/19 0102 08/12/19 0226  WBC 16.0*   < > 10.4  --  9.2  --   --  9.7 6.8 6.3  --   NEUTROABS 14.1*  --  9.6*  --   --   --   --   --   --    --   --   HGB 7.6*   < > 7.7*   < > 7.1*   < > 7.4* 8.9* 9.1* 9.4* 9.5*  HCT 25.2*   < > 25.0*   < > 23.4*   < > 24.6* 29.3* 29.2* 30.9* 30.3*  MCV 79.0*   < > 77.2*  --  77.0*  --   --  76.3* 77.5* 77.6*  --   PLT 228   < > 216  --  210  --   --  214 207 223  --    < > = values in this interval not displayed.   Recent Labs  Lab 08/07/19 0901 08/07/19 1418 08/08/19 0323 08/09/19 0400 08/10/19 0122 08/11/19 0102 08/12/19 0226  NA   < >  --  129* 132* 129* 130* 129*  K   < >  --  3.1* 3.9 4.5 4.4 3.9  CL   < >  --  93* 97* 97* 98 97*  CO2   < >  --  '25 25 25 24 22  '$ GLUCOSE   < >  --  134* 139* 133* 127* 114*  BUN   < >  --  '13 17 18 17 17  '$ CREATININE   < >  --  0.87 1.00 0.95 0.80 0.86  CALCIUM   < >  --  8.9 8.6* 8.6* 9.0 8.9  MG  --  1.9  --  2.1 2.0 2.2 2.1  PHOS  --  3.2  --   --   --  2.6 3.5   < > = values in this interval not displayed.   Lab Results  Component Value Date   HGBA1C 5.2 02/14/2014       Component Value Date/Time   CHOL 176 12/27/2015 1448   TRIG 247 (H) 12/27/2015 1448   HDL 52 12/27/2015 1448   CHOLHDL 3.4 12/27/2015 1448   VLDL 49 (H) 12/27/2015 1448   LDLCALC 75 12/27/2015 1448    Signed, Terrilee Croak, MD [facility] Pager: [phone] (Secure Chat preferred). 08/12/2019

## 2019-08-12 NOTE — Progress Notes (Addendum)
Progress Note  Patient Name: Caleb [name] Date of Encounter: 08/12/2019  Primary Cardiologist: Jenkins Rouge, MD   Subjective   Feeling okay this morning. Breathing is stable. No complaints of chest pain or palpitations. Anticipating going to rehab soon.   Inpatient Medications    Scheduled Meds: . sodium chloride   Intravenous Once  . aspirin  81 mg Oral Daily  . atorvastatin  80 mg Oral Daily  . budesonide (PULMICORT) nebulizer solution  0.5 mg Nebulization BID  . carvedilol  25 mg Oral BID WC  . [START ON 08/13/2019] ferrous sulfate  325 mg Oral Q breakfast  . folic acid  1 mg Oral Daily  . furosemide  40 mg Oral Daily  . gabapentin  300 mg Oral TID  . losartan  50 mg Oral Daily  . multivitamin with minerals  1 tablet Oral Daily  . nicotine  21 mg Transdermal Daily  . pantoprazole  40 mg Oral Daily  . predniSONE  40 mg Oral Q breakfast  . rivaroxaban  2.5 mg Oral BID  . senna-docusate  1 tablet Oral QHS  . sodium chloride flush  3 mL Intravenous Q12H  . spironolactone  25 mg Oral Daily  . thiamine  100 mg Oral Daily   Continuous Infusions: . sodium chloride 250 mL (08/09/19 1213)  . azithromycin 500 mg (08/12/19 0555)  . cefTRIAXone (ROCEPHIN)  IV 2 g (08/12/19 0554)   PRN Meds: sodium chloride, acetaminophen, hydrALAZINE, ipratropium-albuterol, LORazepam **OR** LORazepam, ondansetron (ZOFRAN) IV, sodium chloride flush   Vital Signs    Vitals:   08/12/19 1118 08/12/19 1126 08/12/19 1211 08/12/19 1224  BP: (!) 205/67 (!) 205/67 (!) 164/46 (!) 112/42  Pulse:   73 66  Resp:      Temp:      TempSrc:      SpO2:   97% 97%  Weight:      Height:        Intake/Output Summary (Last 24 hours) at 08/12/2019 1241 Last data filed at 08/12/2019 1002 Gross per 24 hour  Intake 240 ml  Output 1550 ml  Net -1310 ml   Filed Weights   08/08/19 0500 08/09/19 0414 08/10/19 0332  Weight: 66.4 kg 66 kg 66 kg    Telemetry    NSR - Personally Reviewed  ECG    No new  tracings - Personally Reviewed  Physical Exam   GEN: No acute distress.   Neck: No JVD, no carotid bruits Cardiac: RRR, no murmurs, rubs, or gallops.  Respiratory: scattered rhonchi and wheezing GI: NABS, Soft, nontender, non-distended  MS: No edema; No deformity. Neuro:  Nonfocal, moving all extremities spontaneously Psych: Normal affect   Labs    Chemistry Recent Labs  Lab 08/07/19 0518 08/07/19 0901 08/10/19 0122 08/11/19 0102 08/12/19 0226  NA 124*   < > 129* 130* 129*  K 4.0   < > 4.5 4.4 3.9  CL 92*   < > 97* 98 97*  CO2 21*   < > 25 24 22   GLUCOSE 179*   < > 133* 127* 114*  BUN 6*   < > 18 17 17   CREATININE 0.85   < > 0.95 0.80 0.86  CALCIUM 8.8*   < > 8.6* 9.0 8.9  PROT 6.4*  --   --   --   --   ALBUMIN 3.0*  --   --  2.2* 2.3*  AST 24  --   --   --   --  ALT 11  --   --   --   --   ALKPHOS 53  --   --   --   --   BILITOT 0.8  --   --   --   --   GFRNONAA >60   < > >60 >60 >60  GFRAA >60   < > >60 >60 >60  ANIONGAP 11   < > 7 8 10    < > = values in this interval not displayed.     Hematology Recent Labs  Lab 08/09/19 1634 08/09/19 1634 08/10/19 0122 08/11/19 0102 08/12/19 0226  WBC 9.7  --  6.8 6.3  --   RBC 3.84*  --  3.77* 3.98*  --   HGB 8.9*   < > 9.1* 9.4* 9.5*  HCT 29.3*   < > 29.2* 30.9* 30.3*  MCV 76.3*  --  77.5* 77.6*  --   MCH 23.2*  --  24.1* 23.6*  --   MCHC 30.4  --  31.2 30.4  --   RDW 16.4*  --  16.3* 16.5*  --   PLT 214  --  207 223  --    < > = values in this interval not displayed.    Cardiac EnzymesNo results for input(s): TROPONINI in the last 168 hours. No results for input(s): TROPIPOC in the last 168 hours.   BNP Recent Labs  Lab 08/07/19 0519  BNP 1,092.0*     DDimer No results for input(s): DDIMER in the last 168 hours.   Radiology    DG Chest 2 View  Result Date: 08/11/2019 CLINICAL DATA:  Shortness of breath for 1 week.  CHF. EXAM: CHEST - 2 VIEW COMPARISON:  08/07/2019 FINDINGS: Hyperinflation. Prior  median sternotomy. Midline trachea. Mild cardiomegaly. Small bilateral pleural effusions. No pneumothorax. Similar bibasilar airspace disease, likely atelectasis. Suspicion of mild pulmonary venous congestion, without overt congestive failure. IMPRESSION: Given differences in technique, relatively similar appearance of small bilateral pleural effusions and bibasilar airspace disease/atelectasis. Hyperinflation, with suspicion of mild pulmonary venous congestion. Electronically Signed   By: Abigail Miyamoto M.D.   On: 08/11/2019 16:48    Cardiac Studies   ECHO:08/07/19:  1. Left ventricular ejection fraction, by estimation, is 45 to 50%. The  left ventricle has mildly decreased function. The left ventricle  demonstrates global hypokinesis. Left ventricular diastolic parameters are  consistent with Grade II diastolic  dysfunction (pseudonormalization). Elevated left ventricular end-diastolic  pressure.  2. Right ventricular systolic function is moderately reduced. The right  ventricular size is normal. There is normal pulmonary artery systolic  pressure.  3. Left atrial size was mild to moderately dilated.  4. The mitral valve is normal in structure. Mild mitral valve  regurgitation. No evidence of mitral stenosis.  5. Tricuspid valve regurgitation is mild to moderate.  6. The aortic valve is tricuspid. Aortic valve regurgitation is not  visualized. No aortic stenosis is present.  7. The inferior vena cava is normal in size with greater than 50%  respiratory variability, suggesting right atrial pressure of 3 mmHg.   ECHO 07/31/2018:  1. The left ventricle has normal systolic function with an ejection  fraction of 60-65%. The cavity size was normal. There is mildly increased  left ventricular wall thickness. Left ventricular diastolic parameters  were normal.  2. Global longitudinal strain is -20.2%.  3. The right ventricle has normal systolic function. The cavity was  normal.  There is no increase in right ventricular wall thickness.  4. The aortic root is normal in size and structure.   Patient Profile     69 y.o. male with a hx of CADs/pCABG x3 in 2016, PVD s/pright femoropopliteal bypass graft4/2019 at [facility] chronic Xarelto, left renal artery narrowing that is described as moderate, HTN, HLD, tobacco use and hx ofgastritis with superficial bleedingper endoscopy 10/2020who is being followed by cardiology for the evaluation of CHF  Assessment & Plan    1. Acute combined CHF: patient presented with acute respiratory failure. Found to have EF 45-50% on echo this admission with moderately reduce RV systolic function. 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 Home amlodipine discontinued. Home carvedilol uptitrated to 25mg  BID. Losartan 50mg  daily, lasix 40mg  daily, and spironolactone 25mg  daily were started. BP has been significantly elevated with the exception of an isolated reading of 112/42 (error?). UOP is net -1.2L in the past 24 hours and -5.4L this admission. Weight is stable at 145lbs, from 144lbs on admission.  - Continue carvedilol, losartan, spironolactone, and lasix  2. Acute respiratory failure/SIRS: patient presented with fever and SOB. COVID-19 negative. Felt to be multifactorial in the setting of PNA, COPD exacerbation, and acute CHF. S/p 5 days of IV antibiotics. On supportive care with prednisone and nebulizers - Continue management per primary team  3. HTN: BP remains poorly controlled. Possible this also contributed to his mild cardiomyopathy. Home amlodipine on hold. Home carvedilol uptitrated to 25mg  BID and he was started on losartan 50mg  daily, spironolactone 25mg  daily, and lasix 40mg  daily.  - Will transition to irbesartan 150mg  daily for improved BP control - Continue carvedilol, spironolactone, and lasix - Will need BMET in 1 week for close monitoring - this can be obtained at CIR  4. CAD s/p CABG in 2016: no anginal  complaints - Continue aspirin and statin  5. PVD: s/p digit amputations in the past - Continue xarelto  Will arrange post-hospital cardiology follow-up in anticipation of discharge to CIR in the next 24 hours.    For questions or updates, please contact [place] Please consult [email or web address] for contact info under Cardiology/STEMI.      Signed, Abigail Butts, PA-C  08/12/2019, 12:41 PM   [phone]   Attending Note:   The patient was seen and examined.  Agree with assessment and plan as noted above.  Changes made to the above note as needed.  Patient seen and independently examined with  Roby Lofts, PA .   We discussed all aspects of the encounter. I agree with the assessment and plan as stated above.  1.  HTN:   Added spironolactone yesterday  Will try Irbasartan instead of Losartan  Ideally we would like to avoid CCB in the setting of reduced EF.   His EF is only mildly reduced so he may tolerate it  Alternatives - could try hydralazine, cardura, increase aldactone   2.  CHF:  Mildly reduced function.  Cont meds .      I have spent a total of 40 minutes with patient reviewing hospital  notes , telemetry, EKGs, labs and examining patient as well as establishing an assessment and plan that was discussed with the patient. > 50% of time was spent in direct patient care.    Thayer Headings, Brooke Bonito., MD, [facility] 08/12/2019, 1:27 PM [address]  [place] Pager [phone]

## 2019-08-12 NOTE — Progress Notes (Signed)
Inpatient Rehabilitation-Admissions Coordinator   Pt's insurance is offering a peer to peer at this time prior to making a final decision for CIR. Notified Dr. Pietro Cassis of the request which he has agreed to complete. Per his request, I set up the peer to peer with an intended time of 11AM 7/29. Will update once there has been a final determination regarding pt's CIR request.   Please call if questions.   Raechel Ache, OTR/L  Rehab Admissions Coordinator  [phone] 08/12/2019 4:14 PM

## 2019-08-12 NOTE — Telephone Encounter (Signed)
Per Daleen Snook, TOC visit set up for 08/31/19 at 2:15 pm with [facility]

## 2019-08-12 NOTE — Progress Notes (Signed)
Physical Therapy Treatment Patient Details Name: Caleb [name] MRN: [id number] DOB: January 14, 1951 Today's Date: 08/12/2019    History of Present Illness 69yo male c/o difficulty breathing, cough. Hypoxic in the ED and required BiPAP. Covid negative. Admitted wtih SIRS and acute respiratory failure with hypoxia. PMH acute respiratory failure with hypoxia, heart failure, COPD, HTN, CABG, tobacco abuse, R toe amputation    PT Comments    Patient was received laying supine in bed. BP was taken prior to session start at 199/67. Therapeutic exercises were limited to in bed activities in attempt to promote vasodilation and lower [name].  TE: ankle pumps, quad sets, resisted hip abduction, glute bridges, and pelvic tilts. Pt was encouraged to breath normally throughout exercises to decrease use of Valsalva Maneuver. BP was taken after exercise and rose to 205/67. At this time, treatment session was discontinued, patient was educated on high blood pressure and the importance of monitoring this value on a regular basis at home. Patient was left laying in bed with all needs within reach. Nurse was notified of high blood pressure values.     Follow Up Recommendations  CIR;Supervision for mobility/OOB     Equipment Recommendations  Rolling walker with 5" wheels;3in1 (PT)    Recommendations for Other Services       Precautions / Restrictions Precautions Precautions: Fall;Other (comment) Precaution Comments: watch BP Restrictions Weight Bearing Restrictions: No    Mobility  Bed Mobility         Supine to sit: HOB elevated     General bed mobility comments: Not able to assess bed mobility secondary to high blood pressure  Transfers                 General transfer comment: Not able to assess transfers secondary to high blood pressure  Ambulation/Gait             General Gait Details: Not able to assess gait secondary to high blood pressure   Stairs              Wheelchair Mobility    Modified Rankin (Stroke Patients Only)       Balance       Sitting balance - Comments: Not able to assess balance secondary to high blood pressure       Standing balance comment: Not able to assess balance secondary to high blood pressure                            Cognition Arousal/Alertness: Awake/alert Behavior During Therapy: WFL for tasks assessed/performed;Flat affect Overall Cognitive Status: Within Functional Limits for tasks assessed                                        Exercises      General Comments        Pertinent Vitals/Pain Pain Assessment: No/denies pain    Home Living                      Prior Function            PT Goals (current goals can now be found in the care plan section) Acute Rehab PT Goals Patient Stated Goal: get better and go home PT Goal Formulation: With patient Time For Goal Achievement: 08/24/19 Potential to Achieve Goals: Good Progress towards PT goals:  Progressing toward goals    Frequency    Min 3X/week      PT Plan      Co-evaluation              AM-PAC PT "6 Clicks" Mobility   Outcome Measure  Help needed turning from your back to your side while in a flat bed without using bedrails?: A Little Help needed moving from lying on your back to sitting on the side of a flat bed without using bedrails?: A Lot Help needed moving to and from a bed to a chair (including a wheelchair)?: A Little Help needed standing up from a chair using your arms (e.g., wheelchair or bedside chair)?: A Little Help needed to walk in hospital room?: A Lot Help needed climbing 3-5 steps with a railing? : A Lot 6 Click Score: 15    End of Session   Activity Tolerance: Other (comment) (Patient limited by high blood pressure) Patient left: in bed;with call bell/phone within reach Nurse Communication: Precautions (Nurse was notified of patient's blood pressure  after treatment session) PT Visit Diagnosis: Unsteadiness on feet (R26.81);Difficulty in walking, not elsewhere classified (R26.2);Muscle weakness (generalized) (M62.81)     Time:  -     Charges:                       Livingston Diones, SPT, ATC

## 2019-08-13 DIAGNOSIS — R6510 Systemic inflammatory response syndrome (SIRS) of non-infectious origin without acute organ dysfunction: Secondary | ICD-10-CM | POA: Diagnosis not present

## 2019-08-13 LAB — BASIC METABOLIC PANEL
Anion gap: 9 (ref 5–15)
BUN: 19 mg/dL (ref 8–23)
CO2: 22 mmol/L (ref 22–32)
Calcium: 9 mg/dL (ref 8.9–10.3)
Chloride: 98 mmol/L (ref 98–111)
Creatinine, Ser: 0.88 mg/dL (ref 0.61–1.24)
GFR calc Af Amer: >60 mL/min (ref >60)
GFR calc non Af Amer: >60 mL/min (ref >60)
Glucose, Bld: 179 mg/dL — ABNORMAL HIGH (ref 70–99)
Potassium: 3.7 mmol/L (ref 3.5–5.1)
Sodium: 129 mmol/L — ABNORMAL LOW (ref 135–145)

## 2019-08-13 LAB — CULTURE, BLOOD (ROUTINE X 2)
Culture: NO GROWTH
Culture: NO GROWTH
Special Requests: ADEQUATE

## 2019-08-13 MED ORDER — AMLODIPINE BESYLATE 5 MG PO TABS
5.0000 mg | ORAL_TABLET | Freq: Every day | ORAL | Status: DC
  Administered 2019-08-13 – 2019-08-16 (×4): 5 mg via ORAL
  Filled 2019-08-13 (×4): qty 1

## 2019-08-13 MED ORDER — PREDNISONE 20 MG PO TABS
20.0000 mg | ORAL_TABLET | Freq: Every day | ORAL | Status: DC
  Administered 2019-08-14 – 2019-08-16 (×3): 20 mg via ORAL
  Filled 2019-08-13 (×3): qty 1

## 2019-08-13 NOTE — Progress Notes (Signed)
PROGRESS NOTE  Caleb [name]  DOB: [birth year]  PCP: Christain Sacramento, MD DTO:[id number]  DOA: 08/07/2019  LOS: 6 days   Chief Complaint  Patient presents with  . Shortness of Breath    BIPAP   Brief narrative: Caleb [name] is a 69 y.o. male with chronic smoking, chronic alcohol use, PMH of HTN, HLD, CAD s/p CABG, CHF, COPD not on home oxygen, peripheral artery disease. Patient presented to the ED on 08/07/2019 with complaint of shortness of breath, cough, wheezing, intermittent diaphoresis, emesis, malaise and fever for 2 days prior to arrival.  He was admitted for acute respiratory failure with hypoxia due to acute CHF, COPD and possible pneumonia.   Subjective: Patient was seen and examined this morning. Taking his breakfast.  Not in distress.  Not on supplemental oxygen.  No alcohol withdrawal tremors. Blood pressure pattern noted from last 24 hours.  Yesterday afternoon, systolic blood pressure was over 200.  Cardiology adjusted his medications.  Assessment/Plan: Acute respiratory failure with hypoxia -multifactorial including CHF, pneumonia, anemia and COPD exacerbation.  -Required BiPAP on admission.  Gradually weaned down, currently not using supplemental oxygen at rest.  Does not use oxygen at home.    Sepsis likely due to community-acquired pneumonia-POA.   COPD exacerbation -Met sepsis criteria on admission with fever, leukocytosis.   -Elevated procalcitonin.  Chest x-ray showed atelectasis but patient's presentation was clinically consistent with pneumonia.  -Completed 5-day course of IV Rocephin and azithromycin. -Plan to taper prednisone gradually.  Currently on 40 mg daily.  Reduce to 20 mg from tomorrow. -Continue budesonide and nebulizers.   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   -Echo 7/23 with EF of 45 to 50% and global hypokinesis (60 to 65% in 07/2018).   -Cardiology consult  appreciated.  Currently on Lasix 40 mg daily. -Currently appears euvolemic except for chronic RLE swelling.  Renal function stable. -Monitor fluid status, renal function and electrolytes  Elevated troponin:  -felt to be demand ischemia in the setting of the above.  No chest pain or anginal symptoms. -On aspirin per cardiology  Uncontrolled hypertension -Blood pressure pattern noted from last 24 hours.  Yesterday afternoon, systolic blood pressure was over 200.  Cardiology adjusted his medications. --Home meds include amlodipine 10 mg daily, Coreg 12.5 mg twice daily. -Currently on Coreg 25 mg twice daily, Lasix 40 mg daily, irbesartan 300 mg daily and Aldactone 25 mg daily. -Cardiology adjusting blood pressure medications.  Use IV hydralazine as needed .  Peripheral vascular disease:  -patient with prior history of right first digit amputation in 2019. -Continue statin and aspirin. -Reduced Xarelto to 2.5 mg daily  CAD:  -Patient with prior history of three-vessel CABG back in 2016. -Continue statin and aspirin  Hyperlipidemia:  -patient is on atorvastatin 80 mg nightly.  Tobacco abuse:  -Smokes about 10 cigarettes a day -Counseled on the need for cessation of tobacco use -Nicotine patch offered  Iron deficiency anemia:  History of gastritis -Baseline Hgb 8 to 9. 7.6 (admit)> 7.1>1u> 8.9> 9.4. Iron 7. Iron sat 2%. Colo in 10/2018 with 22 to 30 mm sessile polyps that were resected.  EGD revealed gastritis with hemorrhage and sessile polyps in duodenum.  Repeat colonoscopy is recommended but didn't seem to follow up.  He denies melena or hematochezia.  He is on high dose Xarelto for PVD. No Hx of PE/DVT or A. fib.  He is  also on aspirin. -Remains currently stable, 9.5 today. -Previous hospitalist reduced Xarelto to 2.5 mg.  Recommended dose for PVD. -Patient was given 1 dose of IV Feraheme on 7/26.    Started oral iron supplement. -Protonix 40 mg daily -Need outpatient GI  follow-up with Dr. Benson Norway for repeat colonoscopy  Acute on chronic hyponatremia: -Na 124 on admit >> 132 > 130. Urine Na slightly high at 43.  -Likely due to fluid overload and alcohol.  Cortisol slightly low.  TSH was normal. -Sodium 129 on last blood work on 7/28.  Alcohol abuse:  -Patient reports drinkingfour12 ounce beers per day on average. -CIWA protocol was initiated -No withdrawal symptoms. -Continue vitamins  Mobility: PT eval obtained.  CIR recommended Code Status:   Code Status: Full Code  Nutritional status: Body mass index is 24.98 kg/m.     Diet Order            Diet Heart Room service appropriate? Yes; Fluid consistency: Thin; Fluid restriction: 1500 mL Fluid  Diet effective now                 DVT prophylaxis:  rivaroxaban (XARELTO) tablet 2.5 mg   Antimicrobials:  None Fluid: None  Consultants: Cardiology Family Communication:  None at bedside  Status is: Inpatient  Remains inpatient appropriate because pending CIR   Dispo: The patient is from: Home              Anticipated d/c is to: CIR.  Insurance denied CIR.  Peer to peer scheduled for 11 AM this morning              Anticipated d/c date is: 1 day              Patient currently is medically stable to d/c.   Infusions:  . sodium chloride 250 mL (08/09/19 1213)  . azithromycin 500 mg (08/13/19 0658)  . cefTRIAXone (ROCEPHIN)  IV 2 g (08/13/19 0556)    Scheduled Meds: . sodium chloride   Intravenous Once  . aspirin  81 mg Oral Daily  . atorvastatin  80 mg Oral Daily  . budesonide (PULMICORT) nebulizer solution  0.5 mg Nebulization BID  . carvedilol  25 mg Oral BID WC  . ferrous sulfate  325 mg Oral Q breakfast  . folic acid  1 mg Oral Daily  . furosemide  40 mg Oral Daily  . gabapentin  300 mg Oral TID  . irbesartan  300 mg Oral Daily  . multivitamin with minerals  1 tablet Oral Daily  . nicotine  21 mg Transdermal Daily  . pantoprazole  40 mg Oral Daily  . predniSONE  40 mg  Oral Q breakfast  . rivaroxaban  2.5 mg Oral BID  . senna-docusate  1 tablet Oral QHS  . sodium chloride flush  3 mL Intravenous Q12H  . spironolactone  25 mg Oral Daily  . thiamine  100 mg Oral Daily    Antimicrobials: Anti-infectives (From admission, onward)   Start     Dose/Rate Route Frequency Ordered Stop   08/07/19 0600  cefTRIAXone (ROCEPHIN) 2 g in sodium chloride 0.9 % 100 mL IVPB     Discontinue     2 g 200 mL/hr over 30 Minutes Intravenous Every 24 hours 08/07/19 0550     08/07/19 0600  azithromycin (ZITHROMAX) 500 mg in sodium chloride 0.9 % 250 mL IVPB     Discontinue     500 mg 250 mL/hr over 60 Minutes Intravenous Every  24 hours 08/07/19 0550        PRN meds: sodium chloride, acetaminophen, hydrALAZINE, ipratropium-albuterol, LORazepam **OR** LORazepam, ondansetron (ZOFRAN) IV, sodium chloride flush   Objective: Vitals:   08/13/19 0407 08/13/19 0742  BP: (!) 185/72 (!) 151/56  Pulse: 67 76  Resp:  16  Temp: 98.2 F (36.8 C) 97.6 F (36.4 C)  SpO2: 99% 98%    Intake/Output Summary (Last 24 hours) at 08/13/2019 0941 Last data filed at 08/13/2019 0411 Gross per 24 hour  Intake --  Output 1400 ml  Net -1400 ml   Filed Weights   08/08/19 0500 08/09/19 0414 08/10/19 0332  Weight: 66.4 kg 66 kg 66 kg   Weight change:  Body mass index is 24.98 kg/m.   Physical Exam: General exam: Appears calm and comfortable.  Not in physical distress Skin: No rashes, lesions or ulcers. HEENT: Atraumatic, normocephalic, supple neck, no obvious bleeding Lungs: Clear to auscultation bilaterally CVS: Regular rate and rhythm, no murmur GI/Abd soft, nontender, nondistended, bowel sound present CNS: Alert, awake, oriented x3 Psychiatry: Mood appropriate Extremities: No edema, no calf tenderness  Data Review: I have personally reviewed the laboratory data and studies available.  Recent Labs  Lab 08/07/19 0518 08/07/19 0901 08/08/19 0323 08/08/19 0323 08/09/19 0400  08/09/19 0400 08/09/19 0950 08/09/19 1634 08/10/19 0122 08/11/19 0102 08/12/19 0226  WBC 16.0*   < > 10.4  --  9.2  --   --  9.7 6.8 6.3  --   NEUTROABS 14.1*  --  9.6*  --   --   --   --   --   --   --   --   HGB 7.6*   < > 7.7*   < > 7.1*   < > 7.4* 8.9* 9.1* 9.4* 9.5*  HCT 25.2*   < > 25.0*   < > 23.4*   < > 24.6* 29.3* 29.2* 30.9* 30.3*  MCV 79.0*   < > 77.2*  --  77.0*  --   --  76.3* 77.5* 77.6*  --   PLT 228   < > 216  --  210  --   --  214 207 223  --    < > = values in this interval not displayed.   Recent Labs  Lab 08/07/19 0901 08/07/19 1418 08/08/19 0323 08/09/19 0400 08/10/19 0122 08/11/19 0102 08/12/19 0226  NA   < >  --  129* 132* 129* 130* 129*  K   < >  --  3.1* 3.9 4.5 4.4 3.9  CL   < >  --  93* 97* 97* 98 97*  CO2   < >  --  '25 25 25 24 22  '$ GLUCOSE   < >  --  134* 139* 133* 127* 114*  BUN   < >  --  '13 17 18 17 17  '$ CREATININE   < >  --  0.87 1.00 0.95 0.80 0.86  CALCIUM   < >  --  8.9 8.6* 8.6* 9.0 8.9  MG  --  1.9  --  2.1 2.0 2.2 2.1  PHOS  --  3.2  --   --   --  2.6 3.5   < > = values in this interval not displayed.   Lab Results  Component Value Date   HGBA1C 5.2 02/14/2014       Component Value Date/Time   CHOL 176 12/27/2015 1448   TRIG 247 (H) 12/27/2015 1448   HDL  52 12/27/2015 1448   CHOLHDL 3.4 12/27/2015 1448   VLDL 49 (H) 12/27/2015 1448   LDLCALC 75 12/27/2015 1448    Signed, Terrilee Croak, MD [facility] Pager: [phone] (Secure Chat preferred). 08/13/2019

## 2019-08-13 NOTE — Progress Notes (Addendum)
Progress Note  Patient Name: Caleb [name] Date of Encounter: 08/13/2019  [facility] Cardiologist: Jenkins Rouge, MD   Subjective   No acute overnight events. No chest pain. Shortness of breath has improved and patient feels like his breathing is back to baseline. He still has a productive cough.  Inpatient Medications    Scheduled Meds: . sodium chloride   Intravenous Once  . aspirin  81 mg Oral Daily  . atorvastatin  80 mg Oral Daily  . budesonide (PULMICORT) nebulizer solution  0.5 mg Nebulization BID  . carvedilol  25 mg Oral BID WC  . ferrous sulfate  325 mg Oral Q breakfast  . folic acid  1 mg Oral Daily  . furosemide  40 mg Oral Daily  . gabapentin  300 mg Oral TID  . irbesartan  300 mg Oral Daily  . multivitamin with minerals  1 tablet Oral Daily  . nicotine  21 mg Transdermal Daily  . pantoprazole  40 mg Oral Daily  . [START ON 08/14/2019] predniSONE  20 mg Oral Q breakfast  . rivaroxaban  2.5 mg Oral BID  . senna-docusate  1 tablet Oral QHS  . sodium chloride flush  3 mL Intravenous Q12H  . spironolactone  25 mg Oral Daily  . thiamine  100 mg Oral Daily   Continuous Infusions: . sodium chloride 250 mL (08/09/19 1213)  . azithromycin 500 mg (08/13/19 0658)  . cefTRIAXone (ROCEPHIN)  IV 2 g (08/13/19 0556)   PRN Meds: sodium chloride, acetaminophen, hydrALAZINE, ipratropium-albuterol, LORazepam **OR** LORazepam, ondansetron (ZOFRAN) IV, sodium chloride flush   Vital Signs    Vitals:   08/12/19 1937 08/12/19 2315 08/13/19 0407 08/13/19 0742  BP:  (!) 171/62 (!) 185/72 (!) 151/56  Pulse:  59 67 76  Resp:    16  Temp:  98 F (36.7 C) 98.2 F (36.8 C) 97.6 F (36.4 C)  TempSrc:  Oral Oral Oral  SpO2: 100% 96% 99% 98%  Weight:      Height:        Intake/Output Summary (Last 24 hours) at 08/13/2019 0954 Last data filed at 08/13/2019 0411 Gross per 24 hour  Intake --  Output 1400 ml  Net -1400 ml   Last 3 Weights 08/10/2019 08/09/2019 08/08/2019   Weight (lbs) 145 lb 8.1 oz 145 lb 8.1 oz 146 lb 6.2 oz  Weight (kg) 66 kg 66 kg 66.4 kg      Telemetry    Normal sinus rhythm with rates in the 60's to 70's. - Personally Reviewed  ECG    No new ECG tracing. - Personally Reviewed  Physical Exam   GEN: No acute distress.   Neck: No JVD Cardiac: RRR. No murmurs, rubs, or gallops.  Respiratory: Clear to auscultation bilaterally. No significant wheezes, rhonchi, or rales. GI: Soft, non-tender, non-distended  MS: No lower extremity edema. Skin: Warm and dry. Neuro:  No focal deficits. Psych: Normal affect. Responds appropriately.  Labs    High Sensitivity Troponin:   Recent Labs  Lab 08/07/19 0518 08/07/19 0737  TROPONINIHS 68* 201*      Chemistry Recent Labs  Lab 08/07/19 0518 08/07/19 0901 08/10/19 0122 08/11/19 0102 08/12/19 0226  NA 124*   < > 129* 130* 129*  K 4.0   < > 4.5 4.4 3.9  CL 92*   < > 97* 98 97*  CO2 21*   < > 25 24 22   GLUCOSE 179*   < > 133* 127* 114*  BUN 6*   < > 18 17 17   CREATININE 0.85   < > 0.95 0.80 0.86  CALCIUM 8.8*   < > 8.6* 9.0 8.9  PROT 6.4*  --   --   --   --   ALBUMIN 3.0*  --   --  2.2* 2.3*  AST 24  --   --   --   --   ALT 11  --   --   --   --   ALKPHOS 53  --   --   --   --   BILITOT 0.8  --   --   --   --   GFRNONAA >60   < > >60 >60 >60  GFRAA >60   < > >60 >60 >60  ANIONGAP 11   < > 7 8 10    < > = values in this interval not displayed.     Hematology Recent Labs  Lab 08/09/19 1634 08/09/19 1634 08/10/19 0122 08/11/19 0102 08/12/19 0226  WBC 9.7  --  6.8 6.3  --   RBC 3.84*  --  3.77* 3.98*  --   HGB 8.9*   < > 9.1* 9.4* 9.5*  HCT 29.3*   < > 29.2* 30.9* 30.3*  MCV 76.3*  --  77.5* 77.6*  --   MCH 23.2*  --  24.1* 23.6*  --   MCHC 30.4  --  31.2 30.4  --   RDW 16.4*  --  16.3* 16.5*  --   PLT 214  --  207 223  --    < > = values in this interval not displayed.    BNP Recent Labs  Lab 08/07/19 0519  BNP 1,092.0*     DDimer No results for  input(s): DDIMER in the last 168 hours.   Radiology    DG Chest 2 View  Result Date: 08/11/2019 CLINICAL DATA:  Shortness of breath for 1 week.  CHF. EXAM: CHEST - 2 VIEW COMPARISON:  08/07/2019 FINDINGS: Hyperinflation. Prior median sternotomy. Midline trachea. Mild cardiomegaly. Small bilateral pleural effusions. No pneumothorax. Similar bibasilar airspace disease, likely atelectasis. Suspicion of mild pulmonary venous congestion, without overt congestive failure. IMPRESSION: Given differences in technique, relatively similar appearance of small bilateral pleural effusions and bibasilar airspace disease/atelectasis. Hyperinflation, with suspicion of mild pulmonary venous congestion. Electronically Signed   By: Abigail Miyamoto M.D.   On: 08/11/2019 16:48    Cardiac Studies   Echocardiogram 08/07/2019: 1. Left ventricular ejection fraction, by estimation, is 45 to 50%. The  left ventricle has mildly decreased function. The left ventricle  demonstrates global hypokinesis. Left ventricular diastolic parameters are  consistent with Grade II diastolic  dysfunction (pseudonormalization). Elevated left ventricular end-diastolic  pressure.  2. Right ventricular systolic function is moderately reduced. The right  ventricular size is normal. There is normal pulmonary artery systolic  pressure.  3. Left atrial size was mild to moderately dilated.  4. The mitral valve is normal in structure. Mild mitral valve  regurgitation. No evidence of mitral stenosis.  5. Tricuspid valve regurgitation is mild to moderate.  6. The aortic valve is tricuspid. Aortic valve regurgitation is not  visualized. No aortic stenosis is present.  7. The inferior vena cava is normal in size with greater than 50%  respiratory variability, suggesting right atrial pressure of 3 mmHg.   Comparison(s): Changes from prior study are noted. Compared to prior  study, mildly reduced LV and moderately reduced RV function without  focal  wall motion abnormalities.   Patient Profile     69 y.o. male with a history of CAD s/p CABG x3 in 2016, PVD s/p right femoropopliteal bypass in 04/2017 at [facility] on chronic Xarelto, left renal artery narrowing that is described as moderate, hypertension, hyperlipidemia, tobacco abuse, and history of gastritis with superficial bleeding on EGD in 10/2018 who was admitted with acute respiratory failure/SIRS felt to be multifactorial in setting of pneumonia, COPD exacerbation, and acute CHF. Cardiology following for CHF.  Assessment & Plan    Acute Respiratory Failure with SIRS - Patient presented with fever and shortness of breath. COVID-19 negative.  - Felt to be multifactorial in the setting of PNA, COPD exacerbation, and acute CHF. - S/p 5 days of IV antibiotics.  - ON supportive care with prednisone and nebulizers.  - Management per primary team.  Acute on Chronic Combined CHF - BNP elevated at 1,092.  - Echo showed LVEF of 45-50% with global hypokinesis, grade 2 diastolic dysfunction,a dn elevated LVEDP. Also noted to have moderately reduced RV systolic function. 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 - Initially diuresed with IV Lasix but switched to PO Lasix 40mg  daily on 7/26.  - Continue Irbesartan 300mg  daily, Coreg 25mg  twice daily, and Spironolactone 25mg  daily. - Continue to monitor daily weights, strict I/O's, and renal function.  Hypertension - BP has been very poorly controlled as high as 205/67 yesterday. Losartan was swtiched to Irbesartan 300mg  daily with improvement but BP still elevated. - Will add back Amlodipine 5mg  daily (on 10mg  daily at home). Ideally like to stay away from calcium channel blocker in patients with reduced EF but EF only mildly reduced and patient needs better BP control. - Continue Coreg and Spironolactone as above.  Elevated Troponin with Known CAD s/p CABG in 2016 - High-sensitivity troponin elevated at 68 >> 201. - No anginal  complaints. - Felt to be demand ischemia in setting of acute respiratory failure. No ischemic evaluation planned. - Continue aspirin, beta-blocker, and statin.  PVD - S/p digit amputations in the past. - On chronic Xarelto.    For questions or updates, please contact [facility] Please consult [email or web address] for contact info under        Signed, Darreld Mclean, PA-C  08/13/2019, 9:54 AM     Attending Note:   The patient was seen and examined.  Agree with assessment and plan as noted above.  Changes made to the above note as needed.  Patient seen and independently examined with  Sande Rives, PA .   We discussed all aspects of the encounter. I agree with the assessment and plan as stated above.  1.   Acute on chronic combined CHF:  LV is mildly depressed Cont meds.  Needs more aggressive BP management . Cont  Coreg 25 BID Irbesartan 300 mg a day  Lasix 40 a day Spironolactone 25 a day  Added amlodipine 5 today .   Ideally , we would try to avoid CCB in the setting of reduced LV dysfunction but his BP is still significantly elevated and his LV dysfunction is mild .   The risk / benefit consideration would favor adding amlodipine   2.   Adding amlodipine as noted above.   3.  Respiratory failure:   Further plans per IM   4.     I have spent a total of 40 minutes with patient reviewing hospital  notes , telemetry, EKGs, labs and examining patient as well as establishing an  assessment and plan that was discussed with the patient. > 50% of time was spent in direct patient care.    Thayer Headings, Brooke Bonito., MD, [facility] 08/13/2019, 2:14 PM [address],  [place] Pager [phone]

## 2019-08-13 NOTE — Progress Notes (Signed)
Inpatient Rehabilitation-Admissions Coordinator   Despite peer to peer conference today with Dr. Pietro Cassis, the patient's insurance company has denied his request for CIR. AC notified the patient. He is open to SNF placement at this time. TOC and MD aware. AC will sign off.   Please call if questions.   Raechel Ache, OTR/L  Rehab Admissions Coordinator  [phone] 08/13/2019 2:21 PM

## 2019-08-13 NOTE — TOC Progression Note (Signed)
Transition of Care [facility]) - Progression Note    Patient Details  Name: Caleb [name] MRN: [id number] Date of Birth: 1950/03/15  Transition of Care [facility] CM/SW [place], [place] Phone Number: 08/13/2019, 2:17 PM  Clinical Narrative:     Pt was denied CIR. CSW presented pt with SNF offers. Pt stated he didn't want to make decision without daughter. PT stated daughter wont be back til Saturday. CSW explained that they cannot wait until Saturday and requested to call daughter. PT gave permission.   CSW called daughter and left message on VM.   Expected Discharge Plan: [place] Barriers to Discharge: Continued Medical Work up  Expected Discharge Plan and Services Expected Discharge Plan: [place] arrangements for the past 2 months: Single Family Home                                       Social Determinants of Health (SDOH) Interventions    Readmission Risk Interventions No flowsheet data found.

## 2019-08-13 NOTE — Progress Notes (Signed)
Physical Therapy Treatment Patient Details Name: Caleb [name] MRN: [id number] DOB: 1950/10/11 Today's Date: 08/13/2019    History of Present Illness 69yo male c/o difficulty breathing, cough. Hypoxic in the ED and required BiPAP. Covid negative. Admitted wtih SIRS and acute respiratory failure with hypoxia. PMH acute respiratory failure with hypoxia, heart failure, COPD, HTN, CABG, tobacco abuse, R toe amputation    PT Comments    Patient received in chair, very pleasant and feeling much better today. BP still on the high side but within safe range to participate in PT session. See below for mobility/assist levels. Able to progress gait distance significantly today with low subjective RPE, did have an O2 drop on RA but again suspect this was more signal related/not true desat as it recovered into the 90s with standing rest break very quickly on room air. Progressing well, left sitting up in recliner with all needs met this afternoon. Per chart review he has been denied CIR per insurance- DC recommendations updated as appropriate.     Follow Up Recommendations  SNF     Equipment Recommendations  Rolling walker with 5" wheels;3in1 (PT)    Recommendations for Other Services       Precautions / Restrictions Precautions Precautions: Fall;Other (comment) Precaution Comments: watch BP Restrictions Weight Bearing Restrictions: No    Mobility  Bed Mobility               General bed mobility comments: OOB in recliner  Transfers Overall transfer level: Needs assistance Equipment used: Rolling walker (2 wheeled) Transfers: Sit to/from Stand Sit to Stand: Min guard         General transfer comment: min guard, cues for safety/hand placement  Ambulation/Gait Ambulation/Gait assistance: Min guard Gait Distance (Feet): 32 Feet Assistive device: Rolling walker (2 wheeled) Gait Pattern/deviations: Step-through pattern;Trendelenburg;Trunk flexed Gait velocity: decreased    General Gait Details: slow but steady with RW, did have SPO2 drop to 70s on room air but given quick recovery back to 90s suspect this was from poor signal and not true desat   Stairs             Wheelchair Mobility    Modified Rankin (Stroke Patients Only)       Balance Overall balance assessment: Needs assistance Sitting-balance support: Feet supported Sitting balance-Leahy Scale: Good     Standing balance support: Bilateral upper extremity supported;During functional activity Standing balance-Leahy Scale: Fair Standing balance comment: benefits from BUE support                            Cognition Arousal/Alertness: Awake/alert Behavior During Therapy: WFL for tasks assessed/performed;Flat affect Overall Cognitive Status: Within Functional Limits for tasks assessed                                 General Comments: Pt A&Ox4. Requires some safety cues for hand placement during movement with reinforcement education needed. Very pleasant and more talkative today.      Exercises      General Comments General comments (skin integrity, edema, etc.): one time desat while walking but suspect that it was poor signal instead of true O2 drop, otherwise VSS on RA      Pertinent Vitals/Pain Pain Assessment: No/denies pain Faces Pain Scale: No hurt    Home Living  Prior Function            PT Goals (current goals can now be found in the care plan section) Acute Rehab PT Goals Patient Stated Goal: "get up and walk" PT Goal Formulation: With patient Time For Goal Achievement: 08/24/19 Potential to Achieve Goals: Good Progress towards PT goals: Progressing toward goals    Frequency    Min 3X/week      PT Plan Discharge plan needs to be updated    Co-evaluation              AM-PAC PT "6 Clicks" Mobility   Outcome Measure  Help needed turning from your back to your side while in a flat bed  without using bedrails?: A Little Help needed moving from lying on your back to sitting on the side of a flat bed without using bedrails?: A Little Help needed moving to and from a bed to a chair (including a wheelchair)?: A Little Help needed standing up from a chair using your arms (e.g., wheelchair or bedside chair)?: A Little Help needed to walk in hospital room?: A Little Help needed climbing 3-5 steps with a railing? : A Lot 6 Click Score: 17    End of Session Equipment Utilized During Treatment: Gait belt Activity Tolerance: Patient tolerated treatment well Patient left: with call bell/phone within reach;in chair   PT Visit Diagnosis: Unsteadiness on feet (R26.81);Difficulty in walking, not elsewhere classified (R26.2);Muscle weakness (generalized) (M62.81)     Time: 2694-8546 PT Time Calculation (min) (ACUTE ONLY): 15 min  Charges:  $Gait Training: 8-22 mins                     Windell Norfolk, DPT, PN1   Supplemental Physical Therapist [place]    Pager [phone] Acute Rehab Office [phone]

## 2019-08-13 NOTE — Progress Notes (Signed)
Occupational Therapy Progress note  Patient set up A for LB dressing doff/don socks seated in recliner. Min A for functional ambulation in room to doorway in back x2 with seated rest break in between to improve activity tolerance necessary to participate in daily routine. Min cues for safety for hand placement not to pull on walker when standing. Patient vital signs stable on room air with activity. Patient reports was denied CIR and was provided a list for SNF in area, discharge recommendations updated.    08/13/19 1500  OT Visit Information  Last OT Received On 08/13/19  Assistance Needed +1  History of Present Illness 69yo male c/o difficulty breathing, cough. Hypoxic in the ED and required BiPAP. Covid negative. Admitted wtih SIRS and acute respiratory failure with hypoxia. PMH acute respiratory failure with hypoxia, heart failure, COPD, HTN, CABG, tobacco abuse, R toe amputation  Precautions  Precautions Fall;Other (comment)  Precaution Comments watch BP  Pain Assessment  Pain Assessment Faces  Faces Pain Scale 0  Cognition  Arousal/Alertness Awake/alert  Behavior During Therapy WFL for tasks assessed/performed;Flat affect  Overall Cognitive Status Within Functional Limits for tasks assessed  ADL  Overall ADL's  Needs assistance/impaired  Lower Body Dressing Sitting/lateral leans;Set up  Lower Body Dressing Details (indicate cue type and reason) patient able to doff/don socks seated in chair with increased time and set up of clean socks  Toilet Transfer Minimal assistance;Cueing for safety;Ambulation;Regular Toilet;RW  Toilet Transfer Details (indicate cue type and reason) simulated with functional ambulation in room, min A for stability and cues for safely turning to sit into recliner  Functional mobility during ADLs Minimal assistance;Cueing for safety;Cueing for sequencing;Rolling walker  General ADL Comments patient reports feeling better today with mobility/ambulation than  earlier in AM. maintained O2 saturation on room air  Bed Mobility  General bed mobility comments seated in recliner  Balance  Overall balance assessment Needs assistance  Sitting-balance support Feet supported  Sitting balance-Leahy Scale Good  Standing balance support Bilateral upper extremity supported;During functional activity  Standing balance-Leahy Scale Poor  Transfers  Overall transfer level Needs assistance  Equipment used Rolling walker (2 wheeled)  Transfers Sit to/from Stand  Sit to Stand Min assist  General transfer comment cues for safety with hand placement  General Comments  General comments (skin integrity, edema, etc.) VSS on room air  OT - End of Session  Equipment Utilized During Treatment Rolling walker  Activity Tolerance Patient tolerated treatment well  Patient left in chair;with call bell/phone within reach;with chair alarm set  Nurse Communication Mobility status  OT Assessment/Plan  OT Plan Discharge plan needs to be updated  OT Visit Diagnosis Unsteadiness on feet (R26.81);Other abnormalities of gait and mobility (R26.89);Muscle weakness (generalized) (M62.81);Other (comment) (decreased cardiopulmonary tolerance)  OT Frequency (ACUTE ONLY) Min 2X/week  Follow Up Recommendations SNF;Other (comment) (patient reports denial for CIR)  OT Equipment Other (comment) (TBD)  AM-PAC OT "6 Clicks" Daily Activity Outcome Measure (Version 2)  Help from another person eating meals? 4  Help from another person taking care of personal grooming? 3  Help from another person toileting, which includes using toliet, bedpan, or urinal? 3  Help from another person bathing (including washing, rinsing, drying)? 3  Help from another person to put on and taking off regular upper body clothing? 3  Help from another person to put on and taking off regular lower body clothing? 3  6 Click Score 19  OT Goal Progression  Progress towards OT goals Progressing toward goals  Acute  Rehab OT Goals  Patient Stated Goal "get up and walk"  OT Goal Formulation With patient  Time For Goal Achievement 08/25/19  Potential to Achieve Goals Good  ADL Goals  Pt Will Perform Grooming with supervision;standing  Pt Will Perform Lower Body Dressing with supervision;sit to/from stand  Pt Will Transfer to Toilet with supervision;ambulating;bedside commode  Pt Will Perform Toileting - Clothing Manipulation and hygiene with supervision;sit to/from stand  Pt/caregiver will Perform Home Exercise Program Increased strength;Both right and left upper extremity;With theraband;Independently;With written HEP provided  Additional ADL Goal #1 Pt to verbalize at least 3 energy conservation strategies to maximize ADL/IADL performance  OT Time Calculation  OT Start Time (ACUTE ONLY) 1254  OT Stop Time (ACUTE ONLY) 1313  OT Time Calculation (min) 19 min  OT General Charges  $OT Visit 1 Visit  OT Treatments  $Self Care/Home Management  8-22 mins   Delbert Phenix OT OT office: [phone]

## 2019-08-14 DIAGNOSIS — R6510 Systemic inflammatory response syndrome (SIRS) of non-infectious origin without acute organ dysfunction: Secondary | ICD-10-CM | POA: Diagnosis not present

## 2019-08-14 DIAGNOSIS — I5042 Chronic combined systolic (congestive) and diastolic (congestive) heart failure: Secondary | ICD-10-CM

## 2019-08-14 DIAGNOSIS — J9601 Acute respiratory failure with hypoxia: Secondary | ICD-10-CM | POA: Diagnosis not present

## 2019-08-14 NOTE — Progress Notes (Addendum)
Progress Note  Patient Name: Caleb [name] Date of Encounter: 08/14/2019  [facility] Cardiologist: Jenkins Rouge, MD   Subjective   No acute overnight events. Stable from Cardiology standpoint. No chest pain or shortness of breath. BP improving but still elevated.  Inpatient Medications    Scheduled Meds: . sodium chloride   Intravenous Once  . amLODipine  5 mg Oral Daily  . aspirin  81 mg Oral Daily  . atorvastatin  80 mg Oral Daily  . budesonide (PULMICORT) nebulizer solution  0.5 mg Nebulization BID  . carvedilol  25 mg Oral BID WC  . ferrous sulfate  325 mg Oral Q breakfast  . folic acid  1 mg Oral Daily  . furosemide  40 mg Oral Daily  . gabapentin  300 mg Oral TID  . irbesartan  300 mg Oral Daily  . multivitamin with minerals  1 tablet Oral Daily  . nicotine  21 mg Transdermal Daily  . pantoprazole  40 mg Oral Daily  . predniSONE  20 mg Oral Q breakfast  . rivaroxaban  2.5 mg Oral BID  . senna-docusate  1 tablet Oral QHS  . sodium chloride flush  3 mL Intravenous Q12H  . spironolactone  25 mg Oral Daily  . thiamine  100 mg Oral Daily   Continuous Infusions: . sodium chloride 250 mL (08/09/19 1213)  . azithromycin 500 mg (08/14/19 0547)  . cefTRIAXone (ROCEPHIN)  IV 2 g (08/14/19 0650)   PRN Meds: sodium chloride, acetaminophen, hydrALAZINE, ipratropium-albuterol, ondansetron (ZOFRAN) IV, sodium chloride flush   Vital Signs    Vitals:   08/13/19 2314 08/14/19 0304 08/14/19 0738 08/14/19 0749  BP: (!) 151/42 (!) 166/57 (!) 145/40   Pulse: 69 78 70   Resp: 13 13 16    Temp: 97.8 F (36.6 C) 98.6 F (37 C) 98.2 F (36.8 C)   TempSrc: Oral Oral Oral   SpO2: 96% 96% 97% 98%  Weight:      Height:        Intake/Output Summary (Last 24 hours) at 08/14/2019 0948 Last data filed at 08/13/2019 2300 Gross per 24 hour  Intake 360 ml  Output 1200 ml  Net -840 ml   Last 3 Weights 08/10/2019 08/09/2019 08/08/2019  Weight (lbs) 145 lb 8.1 oz 145 lb 8.1 oz 146 lb  6.2 oz  Weight (kg) 66 kg 66 kg 66.4 kg      Telemetry    Normal sinus rhythm with rates in the 60's to 70's. - Personally Reviewed  ECG    No new ECG tracing today. - Personally Reviewed  Physical Exam   GEN: No acute distress.   Neck: No JVD Cardiac: RRR. No murmurs, rubs, or gallops.  Respiratory: Clear to auscultation bilaterally. No wheezes, rhonchi, or rales. GI: Soft, non-tender, non-distended. Bowel sounds present. MS: Chronic mild right lower extremity edema. No deformity. Neuro:  No focal deficits. Psych: Normal affect. Responds appropriately.  Labs    High Sensitivity Troponin:   Recent Labs  Lab 08/07/19 0518 08/07/19 0737  TROPONINIHS 68* 201*      Chemistry Recent Labs  Lab 08/11/19 0102 08/12/19 0226 08/13/19 1027  NA 130* 129* 129*  K 4.4 3.9 3.7  CL 98 97* 98  CO2 24 22 22   GLUCOSE 127* 114* 179*  BUN 17 17 19   CREATININE 0.80 0.86 0.88  CALCIUM 9.0 8.9 9.0  ALBUMIN 2.2* 2.3*  --   GFRNONAA >60 >60 >60  GFRAA >60 >60 >60  ANIONGAP 8 10 9      Hematology Recent Labs  Lab 08/09/19 1634 08/09/19 1634 08/10/19 0122 08/11/19 0102 08/12/19 0226  WBC 9.7  --  6.8 6.3  --   RBC 3.84*  --  3.77* 3.98*  --   HGB 8.9*   < > 9.1* 9.4* 9.5*  HCT 29.3*   < > 29.2* 30.9* 30.3*  MCV 76.3*  --  77.5* 77.6*  --   MCH 23.2*  --  24.1* 23.6*  --   MCHC 30.4  --  31.2 30.4  --   RDW 16.4*  --  16.3* 16.5*  --   PLT 214  --  207 223  --    < > = values in this interval not displayed.    BNPNo results for input(s): BNP, PROBNP in the last 168 hours.   DDimer No results for input(s): DDIMER in the last 168 hours.   Radiology    No results found.  Cardiac Studies   Echocardiogram 08/07/2019: 1. Left ventricular ejection fraction, by estimation, is 45 to 50%. The  left ventricle has mildly decreased function. The left ventricle  demonstrates global hypokinesis. Left ventricular diastolic parameters are  consistent with Grade II diastolic    dysfunction (pseudonormalization). Elevated left ventricular end-diastolic  pressure.  2. Right ventricular systolic function is moderately reduced. The right  ventricular size is normal. There is normal pulmonary artery systolic  pressure.  3. Left atrial size was mild to moderately dilated.  4. The mitral valve is normal in structure. Mild mitral valve  regurgitation. No evidence of mitral stenosis.  5. Tricuspid valve regurgitation is mild to moderate.  6. The aortic valve is tricuspid. Aortic valve regurgitation is not  visualized. No aortic stenosis is present.  7. The inferior vena cava is normal in size with greater than 50%  respiratory variability, suggesting right atrial pressure of 3 mmHg.   Comparison(s): Changes from prior study are noted. Compared to prior  study, mildly reduced LV and moderately reduced RV function without focal  wall motion abnormalities.   Patient Profile     69 y.o. 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 Cardiology following for CHF and HTN.  Assessment & Plan    Acute Respiratory Failure with SIRS - Patient presented with fever and shortness of breath. COVID-19 negative.  - Felt to be multifactorial in the setting of PNA, COPD exacerbation, and acute CHF. - S/p 5 days of IV antibiotics.  - ON supportive care with prednisone and nebulizers.  - Management per primary team.  Acute on Chronic Combined CHF - BNP elevated at 1,092.  - Echo showed LVEF of 45-50% with global hypokinesis, grade 2 diastolic dysfunction,a dn elevated LVEDP. Also noted to  have moderately reduced RV systolic function. 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 - Initially diuresed with IV Lasix but switched to PO Lasix 40mg  daily on 7/26. Net negative 7.8 L this admission. - Appears euvolemic on exam. - Continue Irbesartan 300mg  daily, Coreg 25mg  twice daily, and Spironolactone 25mg  daily. - Continue to monitor daily weights, strict I/O's, and renal function.  Hypertension - BP  As  high as 205/67 on 7/28. Losartan was swtiched to Irbesartan 300mg  daily and Amlodipine restarted yesterday with some improvement. BP still elevated but much better compared to 2 days ago. - Continue Irbesartan 300mg  daily, Coreg 25mg  twice daily, Spironolactone 25mg  daily, and Amlodipine 5mg  daily. - If BP remains elevated, can consider increasing Amlodipine back to home dose of 10mg  daily.  Elevated Troponin with Known CAD s/p CABG in 2016 - High-sensitivity troponin elevated at 68 >> 201. - No anginal complaints. - Felt to be demand ischemia in setting of acute respiratory failure. No ischemic evaluation planned. - Continue aspirin, beta-blocker, and statin.  PVD - S/p digit amputations in the past. - On chronic Xarelto.  For questions or updates, please contact [place] Please consult [email or web address] for contact info under        Signed, Darreld Mclean, PA-C  08/14/2019, 9:48 AM     Attending Note:   The patient was seen and examined.  Agree with assessment and plan as noted above.  Changes made to the above note as needed.  Patient seen and independently examined with  Sande Rives, PA .   We discussed all aspects of the encounter. I agree with the assessment and plan as stated above.  1.   HTN:  bp is normal this afternoon Cont current meds.   2. Mild combined S/D CHF:   Cont meds.   3.  CAD :   No angina    I have spent a total of 40 minutes with patient reviewing hospital  notes , telemetry, EKGs, labs and examining patient as well as establishing  an assessment and plan that was discussed with the patient. > 50% of time was spent in direct patient care.    Thayer Headings, Brooke Bonito., MD, [facility] 08/14/2019, 1:10 PM [address]  [place] Pager [phone]

## 2019-08-14 NOTE — TOC Progression Note (Addendum)
Transition of Care [facility]) - Progression Note    Patient Details  Name: Caleb [name] MRN: [id number] Date of Birth: 1950/11/06  Transition of Care [facility]) CM/SW [place], [place] Phone Number: 08/14/2019, 4:02 PM  Clinical Narrative:     CSW informed pt that [facility] did not have any available beds. Pt chose [facility]. CSW reached out to [facility] , they are still able to provide a bed. Pt insurance is managed by regular [employer] and the SNF needs to start auth.   Pt will need new covid test proir to d/c.  Expected Discharge Plan: [place] Barriers to Discharge: Continued Medical Work up  Expected Discharge Plan and Services Expected Discharge Plan: [place] arrangements for the past 2 months: Single Family Home                                       Social Determinants of Health (SDOH) Interventions    Readmission Risk Interventions No flowsheet data found.  Emeterio Reeve, Latanya Presser, [place] Social Worker [phone]

## 2019-08-14 NOTE — Progress Notes (Signed)
PROGRESS NOTE  Caleb [name]  DOB: 07/30/1950  PCP: Christain Sacramento, MD RNH:[id number]  DOA: 08/07/2019  LOS: 7 days   Chief Complaint  Patient presents with  . Shortness of Breath    BIPAP   Brief narrative: Caleb [name] is a 69 y.o. male with chronic smoking, chronic alcohol use, PMH of HTN, HLD, CAD s/p CABG, CHF, COPD not on home oxygen, peripheral artery disease. Patient presented to the ED on 08/07/2019 with complaint of shortness of breath, cough, wheezing, intermittent diaphoresis, emesis, malaise and fever for 2 days prior to arrival.  He was admitted for acute respiratory failure with hypoxia due to acute CHF, COPD and possible pneumonia.   Subjective: Patient was seen and examined this morning. Propped up in bed.  Not in distress.  Not on supplemental oxygen. Blood pressure trend show improvement with medication adjustment. Pending SNF placement  Assessment/Plan: Acute respiratory failure with hypoxia -multifactorial including CHF, pneumonia, anemia and COPD exacerbation.  -Required BiPAP on admission.  Gradually weaned down, currently not using supplemental oxygen at rest.  Does not use oxygen at home.    Sepsis likely due to community-acquired pneumonia-POA.   COPD exacerbation -Met sepsis criteria on admission with fever, leukocytosis.   -Elevated procalcitonin.  Chest x-ray showed atelectasis but patient's presentation was clinically consistent with pneumonia.  -Completed 5-day course of IV Rocephin and azithromycin. -Plan to taper prednisone gradually.  Reduce from 40 mg to 20 mg daily starting today -Continue budesonide and nebulizers.   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   -Echo 7/23 with EF of 45 to 50% and global hypokinesis (60 to 65% in 07/2018).   -Cardiology consult appreciated.  Currently on Lasix 40 mg daily. -Currently appears euvolemic except for chronic RLE  swelling.  Renal function stable. -Monitor fluid status, renal function and electrolytes  Elevated troponin:  -felt to be demand ischemia in the setting of the above.  No chest pain or anginal symptoms. -On aspirin per cardiology  Uncontrolled hypertension -Patient has had blood pressure elevated up to 200s.   -Cardiology consultation appreciated.  Medications adjusted.   -Currently on Coreg 25 mg twice daily, Lasix 40 mg daily, irbesartan 300 mg daily, Aldactone 25 mg daily and amlodipine 5 mg daily. -Blood pressure trend slow gradual improvement.  Continue to monitor.  Use IV hydralazine as needed .  Peripheral vascular disease:  -patient with prior history of right first digit amputation in 2019. -Continue statin and aspirin. -Reduced Xarelto to 2.5 mg daily  CAD:  -Patient with prior history of three-vessel CABG back in 2016. -Continue statin and aspirin  Hyperlipidemia:  -patient is on atorvastatin 80 mg nightly.  Tobacco abuse:  -Smokes about 10 cigarettes a day -Counseled on the need for cessation of tobacco use -Nicotine patch offered  Iron deficiency anemia:  History of gastritis -Baseline Hgb 8 to 9. 7.6 (admit)> 7.1>1u> 8.9> 9.4. Iron 7. Iron sat 2%. Colo in 10/2018 with 22 to 30 mm sessile polyps that were resected.  EGD revealed gastritis with hemorrhage and sessile polyps in duodenum.  Repeat colonoscopy is recommended but didn't seem to follow up.  He denies melena or hematochezia.  He is on high dose Xarelto for PVD. No Hx of PE/DVT or A. fib.  He is also on aspirin. -Remains currently stable, 9.5 today. -Previous hospitalist reduced Xarelto to 2.5 mg.  Recommended dose for PVD. -Patient was given  1 dose of IV Feraheme on 7/26.  Started oral iron supplement. -Continue Protonix 40 mg daily -Need outpatient GI follow-up with Dr. Benson Norway for repeat colonoscopy  Acute on chronic hyponatremia: -Na 124 on admit >> 132 > 130. Urine Na slightly high at 43.    -Likely due to fluid overload and alcohol.  Cortisol slightly low.  TSH was normal. -Sodium 129 on last blood work on 7/28.  Alcohol abuse:  -Patient reports drinkingfour12 ounce beers per day on average. -CIWA protocol was initiated -No withdrawal symptoms. -Continue vitamins  Mobility: PT eval obtained.  CIR recommended Code Status:   Code Status: Full Code  Nutritional status: Body mass index is 24.98 kg/m.     Diet Order            Diet Heart Room service appropriate? Yes; Fluid consistency: Thin; Fluid restriction: 1500 mL Fluid  Diet effective now                 DVT prophylaxis:  rivaroxaban (XARELTO) tablet 2.5 mg   Antimicrobials:  None Fluid: None  Consultants: Cardiology Family Communication:  None at bedside  Status is: Inpatient  Remains inpatient appropriate because pending SNF placement   Dispo: The patient is from: Home              Anticipated d/c is to: Insurance denied [facility].  SNF placement pending.              Anticipated d/c date is: 1 day              Patient currently is medically stable to d/c.   Infusions:  . sodium chloride 250 mL (08/09/19 1213)  . azithromycin 500 mg (08/14/19 0547)  . cefTRIAXone (ROCEPHIN)  IV 2 g (08/14/19 0650)    Scheduled Meds: . sodium chloride   Intravenous Once  . amLODipine  5 mg Oral Daily  . aspirin  81 mg Oral Daily  . atorvastatin  80 mg Oral Daily  . budesonide (PULMICORT) nebulizer solution  0.5 mg Nebulization BID  . carvedilol  25 mg Oral BID WC  . ferrous sulfate  325 mg Oral Q breakfast  . folic acid  1 mg Oral Daily  . furosemide  40 mg Oral Daily  . gabapentin  300 mg Oral TID  . irbesartan  300 mg Oral Daily  . multivitamin with minerals  1 tablet Oral Daily  . nicotine  21 mg Transdermal Daily  . pantoprazole  40 mg Oral Daily  . predniSONE  20 mg Oral Q breakfast  . rivaroxaban  2.5 mg Oral BID  . senna-docusate  1 tablet Oral QHS  . sodium chloride flush  3 mL Intravenous  Q12H  . spironolactone  25 mg Oral Daily  . thiamine  100 mg Oral Daily    Antimicrobials: Anti-infectives (From admission, onward)   Start     Dose/Rate Route Frequency Ordered Stop   08/07/19 0600  cefTRIAXone (ROCEPHIN) 2 g in sodium chloride 0.9 % 100 mL IVPB     Discontinue     2 g 200 mL/hr over 30 Minutes Intravenous Every 24 hours 08/07/19 0550     08/07/19 0600  azithromycin (ZITHROMAX) 500 mg in sodium chloride 0.9 % 250 mL IVPB     Discontinue     500 mg 250 mL/hr over 60 Minutes Intravenous Every 24 hours 08/07/19 0550        PRN meds: sodium chloride, acetaminophen, hydrALAZINE, ipratropium-albuterol, ondansetron (ZOFRAN) IV, sodium  chloride flush   Objective: Vitals:   08/14/19 0738 08/14/19 0749  BP: (!) 145/40   Pulse: 70   Resp: 16   Temp: 98.2 F (36.8 C)   SpO2: 97% 98%    Intake/Output Summary (Last 24 hours) at 08/14/2019 1035 Last data filed at 08/13/2019 2300 Gross per 24 hour  Intake 360 ml  Output 1200 ml  Net -840 ml   Filed Weights   08/08/19 0500 08/09/19 0414 08/10/19 0332  Weight: 66.4 kg 66 kg 66 kg   Weight change:  Body mass index is 24.98 kg/m.   Physical Exam: General exam: Appears calm and comfortable.  Not in physical distress Skin: No rashes, lesions or ulcers. HEENT: Atraumatic, normocephalic, supple neck, no obvious bleeding Lungs: Clear to auscultation bilaterally.  No crackles, no wheezing CVS: Regular rate and rhythm, no murmur GI/Abd soft, nontender, nondistended, bowel sound present CNS: Alert, awake, oriented x3 Psychiatry: Mood appropriate Extremities: No edema, no calf tenderness  Data Review: I have personally reviewed the laboratory data and studies available.  Recent Labs  Lab 08/08/19 0323 08/08/19 0323 08/09/19 0400 08/09/19 0400 08/09/19 0950 08/09/19 1634 08/10/19 0122 08/11/19 0102 08/12/19 0226  WBC 10.4  --  9.2  --   --  9.7 6.8 6.3  --   NEUTROABS 9.6*  --   --   --   --   --   --   --    --   HGB 7.7*   < > 7.1*   < > 7.4* 8.9* 9.1* 9.4* 9.5*  HCT 25.0*   < > 23.4*   < > 24.6* 29.3* 29.2* 30.9* 30.3*  MCV 77.2*  --  77.0*  --   --  76.3* 77.5* 77.6*  --   PLT 216  --  210  --   --  214 207 223  --    < > = values in this interval not displayed.   Recent Labs  Lab 08/07/19 1418 08/08/19 0323 08/09/19 0400 08/10/19 0122 08/11/19 0102 08/12/19 0226 08/13/19 1027  NA  --    < > 132* 129* 130* 129* 129*  K  --    < > 3.9 4.5 4.4 3.9 3.7  CL  --    < > 97* 97* 98 97* 98  CO2  --    < > _0 GLUCOSE  --    < > 139* 133* 127* 114* 179*  BUN  --    < > _1 CREATININE  --    < > 1.00 0.95 0.80 0.86 0.88  CALCIUM  --    < > 8.6* 8.6* 9.0 8.9 9.0  MG 1.9  --  2.1 2.0 2.2 2.1  --   PHOS 3.2  --   --   --  2.6 3.5  --    < > = values in this interval not displayed.   Lab Results  Component Value Date   HGBA1C 5.2 02/14/2014       Component Value Date/Time   CHOL 176 12/27/2015 1448   TRIG 247 (H) 12/27/2015 1448   HDL 52 12/27/2015 1448   CHOLHDL 3.4 12/27/2015 1448   VLDL 49 (H) 12/27/2015 1448   LDLCALC 75 12/27/2015 1448    Signed, Terrilee Croak, MD [facility] Pager: [phone] (Secure Chat preferred). 08/14/2019

## 2019-08-15 DIAGNOSIS — R6510 Systemic inflammatory response syndrome (SIRS) of non-infectious origin without acute organ dysfunction: Secondary | ICD-10-CM | POA: Diagnosis not present

## 2019-08-15 DIAGNOSIS — J9600 Acute respiratory failure, unspecified whether with hypoxia or hypercapnia: Secondary | ICD-10-CM

## 2019-08-15 NOTE — Progress Notes (Signed)
PROGRESS NOTE  Caleb [name]  DOB: Jun 19, 1950  PCP: Christain Sacramento, MD ZOX:[id number]  DOA: 08/07/2019  LOS: 8 days   Chief Complaint  Patient presents with  . Shortness of Breath    BIPAP   Brief narrative: [name] is a 69 y.o. male with chronic smoking, chronic alcohol use, PMH of HTN, HLD, CAD s/p CABG, CHF, COPD not on home oxygen, peripheral artery disease. Patient presented to the ED on 08/07/2019 with complaint of shortness of breath, cough, wheezing, intermittent diaphoresis, emesis, malaise and fever for 2 days prior to arrival.  He was admitted for acute respiratory failure with hypoxia due to acute CHF, COPD and possible pneumonia.   Subjective: Patient was seen and examined this morning. Sitting up in chair.  Not in distress.  No new symptoms.  Blood pressure better controlled now. Pending SNF placement  Assessment/Plan: Acute respiratory failure with hypoxia -multifactorial including CHF, pneumonia, anemia and COPD exacerbation.  -Required BiPAP on admission.  Gradually weaned down, currently not using supplemental oxygen at rest.  Does not use oxygen at home.    Sepsis likely due to community-acquired pneumonia-POA.   COPD exacerbation -Met sepsis criteria on admission with fever, leukocytosis.   -Elevated procalcitonin.  Chest x-ray showed atelectasis but patient's presentation was clinically consistent with pneumonia.  -Completed 5-day course of IV Rocephin and azithromycin. -Currently on tapering course of prednisone, received 20 mg this morning.. -Continue budesonide and nebulizers.   -OOB/IS/PT/OT  Acute on chronic systolicCHF:  -Had some evidence of fluid overload with elevated BNP to 1100 and concerning CXR.   -Echo 7/23 with EF of 45 to 50% and global hypokinesis (60 to 65% in 07/2018).   -Cardiology consult appreciated.  Currently on Lasix 40 mg daily. -Currently appears euvolemic except for chronic RLE swelling.  Renal function stable. -Monitor  fluid status, renal function and electrolytes  Elevated troponin:  -felt to be demand ischemia in the setting of the above.  No chest pain or anginal symptoms. -On aspirin per cardiology  Uncontrolled hypertension -Patient has had blood pressure elevated up to 200s.   -Cardiology consultation appreciated.  Medications adjusted.   -Currently on Coreg 25 mg twice daily, Lasix 40 mg daily, irbesartan 300 mg daily, Aldactone 25 mg daily and amlodipine 5 mg daily. -Blood pressure is more stable in last 24 hours. Continue to monitor.  Use IV hydralazine as needed .  Peripheral vascular disease:  -patient with prior history of right first digit amputation in 2019. -Continue statin and aspirin. -Reduced Xarelto to 2.5 mg daily  CAD:  -Patient with prior history of three-vessel CABG back in 2016. -Continue statin and aspirin  Hyperlipidemia:  -patient is on atorvastatin 80 mg nightly.  Tobacco abuse:  -Smokes about 10 cigarettes a day -Counseled on the need for cessation of tobacco use -Nicotine patch offered  Iron deficiency anemia:  History of gastritis -Baseline Hgb 8 to 9. 7.6 (admit)> 7.1>1u> 8.9> 9.4. Iron 7. Iron sat 2%. Colo in 10/2018 with 22 to 30 mm sessile polyps that were resected.  EGD revealed gastritis with hemorrhage and sessile polyps in duodenum.  Repeat colonoscopy is recommended but didn't seem to follow up.  He denies melena or hematochezia.  He is on high dose Xarelto for PVD. No Hx of PE/DVT or A. fib.  He is also on aspirin. -Remains currently stable, 9.5 on last blood work on 7/29. -Previous hospitalist reduced Xarelto to 2.5 mg.  Recommended dose for PVD. -Patient was given  1 dose of IV Feraheme on 7/26.  Started oral iron supplement. -Continue Protonix 40 mg daily -Need outpatient GI follow-up with Dr. Benson Norway for repeat colonoscopy  Acute on chronic hyponatremia: -Na 124 on admit >> 132 > 130. Urine Na slightly high at 43.  -Likely due to fluid overload  and alcohol.  Cortisol slightly low.  TSH was normal. -Sodium 129 on last blood work on 7/28.  Alcohol abuse:  -Prior to this hospitalization, patient was drinkingfour12 ounce beers per day on average. --No withdrawal symptoms. -Continue vitamins  Mobility: PT eval obtained.  CIR recommended Code Status:   Code Status: Full Code  Nutritional status: Body mass index is 24.9 kg/m.     Diet Order            Diet Heart Room service appropriate? Yes; Fluid consistency: Thin; Fluid restriction: 1500 mL Fluid  Diet effective now                 DVT prophylaxis:  rivaroxaban (XARELTO) tablet 2.5 mg   Antimicrobials:  None Fluid: None  Consultants: Cardiology Family Communication:  None at bedside  Status is: Inpatient  Remains inpatient appropriate because pending SNF placement   Dispo: The patient is from: Home              Anticipated d/c is to: Insurance denied [facility].  SNF placement pending.              Anticipated d/c date is: Whenever insurance authorization is available              Patient currently is medically stable to d/c.   Infusions:  . sodium chloride 250 mL (08/09/19 1213)  . azithromycin 500 mg (08/15/19 0530)  . cefTRIAXone (ROCEPHIN)  IV 2 g (08/15/19 0657)    Scheduled Meds: . sodium chloride   Intravenous Once  . amLODipine  5 mg Oral Daily  . aspirin  81 mg Oral Daily  . atorvastatin  80 mg Oral Daily  . budesonide (PULMICORT) nebulizer solution  0.5 mg Nebulization BID  . carvedilol  25 mg Oral BID WC  . ferrous sulfate  325 mg Oral Q breakfast  . folic acid  1 mg Oral Daily  . furosemide  40 mg Oral Daily  . gabapentin  300 mg Oral TID  . irbesartan  300 mg Oral Daily  . multivitamin with minerals  1 tablet Oral Daily  . nicotine  21 mg Transdermal Daily  . pantoprazole  40 mg Oral Daily  . predniSONE  20 mg Oral Q breakfast  . rivaroxaban  2.5 mg Oral BID  . senna-docusate  1 tablet Oral QHS  . sodium chloride flush  3 mL  Intravenous Q12H  . spironolactone  25 mg Oral Daily  . thiamine  100 mg Oral Daily    Antimicrobials: Anti-infectives (From admission, onward)   Start     Dose/Rate Route Frequency Ordered Stop   08/07/19 0600  cefTRIAXone (ROCEPHIN) 2 g in sodium chloride 0.9 % 100 mL IVPB     Discontinue     2 g 200 mL/hr over 30 Minutes Intravenous Every 24 hours 08/07/19 0550     08/07/19 0600  azithromycin (ZITHROMAX) 500 mg in sodium chloride 0.9 % 250 mL IVPB     Discontinue     500 mg 250 mL/hr over 60 Minutes Intravenous Every 24 hours 08/07/19 0550        PRN meds: sodium chloride, acetaminophen, hydrALAZINE, ipratropium-albuterol, ondansetron (ZOFRAN)  IV, sodium chloride flush   Objective: Vitals:   08/15/19 0800 08/15/19 1100  BP: 122/66 (!) 113/40  Pulse: 80 62  Resp:    Temp: 98.2 F (36.8 C)   SpO2: 95% 98%    Intake/Output Summary (Last 24 hours) at 08/15/2019 1322 Last data filed at 08/15/2019 0100 Gross per 24 hour  Intake --  Output 600 ml  Net -600 ml   Filed Weights   08/09/19 0414 08/10/19 0332 08/15/19 0500  Weight: 66 kg 66 kg 65.8 kg   Weight change:  Body mass index is 24.9 kg/m.   Physical Exam: General exam: Appears calm and comfortable.  Not in physical distress Skin: No rashes, lesions or ulcers. HEENT: Atraumatic, normocephalic, supple neck, no obvious bleeding Lungs: Clear to auscultation bilaterally.  No crackles or wheezing. CVS: Regular rate and rhythm, no murmur GI/Abd soft, nontender, nondistended, bowel sound present CNS: Alert, awake, oriented x3 Psychiatry: Mood appropriate Extremities: No edema, no calf tenderness  Data Review: I have personally reviewed the laboratory data and studies available.  Recent Labs  Lab 08/09/19 0400 08/09/19 0400 08/09/19 0950 08/09/19 1634 08/10/19 0122 08/11/19 0102 08/12/19 0226  WBC 9.2  --   --  9.7 6.8 6.3  --   HGB 7.1*   < > 7.4* 8.9* 9.1* 9.4* 9.5*  HCT 23.4*   < > 24.6* 29.3* 29.2*  30.9* 30.3*  MCV 77.0*  --   --  76.3* 77.5* 77.6*  --   PLT 210  --   --  214 207 223  --    < > = values in this interval not displayed.   Recent Labs  Lab 08/09/19 0400 08/10/19 0122 08/11/19 0102 08/12/19 0226 08/13/19 1027  NA 132* 129* 130* 129* 129*  K 3.9 4.5 4.4 3.9 3.7  CL 97* 97* 98 97* 98  CO2 _0 GLUCOSE 139* 133* 127* 114* 179*  BUN _1 CREATININE 1.00 0.95 0.80 0.86 0.88  CALCIUM 8.6* 8.6* 9.0 8.9 9.0  MG 2.1 2.0 2.2 2.1  --   PHOS  --   --  2.6 3.5  --    Lab Results  Component Value Date   HGBA1C 5.2 02/14/2014       Component Value Date/Time   CHOL 176 12/27/2015 1448   TRIG 247 (H) 12/27/2015 1448   HDL 52 12/27/2015 1448   CHOLHDL 3.4 12/27/2015 1448   VLDL 49 (H) 12/27/2015 1448   LDLCALC 75 12/27/2015 1448    Signed, Terrilee Croak, MD [facility] Pager: [phone] (Secure Chat preferred). 08/15/2019

## 2019-08-15 NOTE — Progress Notes (Signed)
Progress Note  Patient Name: Caleb [name] Date of Encounter: 08/15/2019  Primary Cardiologist: Jenkins Rouge, MD   Subjective   No chest pain or sob. Wonders when he will get to go home.  Inpatient Medications    Scheduled Meds: . sodium chloride   Intravenous Once  . amLODipine  5 mg Oral Daily  . aspirin  81 mg Oral Daily  . atorvastatin  80 mg Oral Daily  . budesonide (PULMICORT) nebulizer solution  0.5 mg Nebulization BID  . carvedilol  25 mg Oral BID WC  . ferrous sulfate  325 mg Oral Q breakfast  . folic acid  1 mg Oral Daily  . furosemide  40 mg Oral Daily  . gabapentin  300 mg Oral TID  . irbesartan  300 mg Oral Daily  . multivitamin with minerals  1 tablet Oral Daily  . nicotine  21 mg Transdermal Daily  . pantoprazole  40 mg Oral Daily  . predniSONE  20 mg Oral Q breakfast  . rivaroxaban  2.5 mg Oral BID  . senna-docusate  1 tablet Oral QHS  . sodium chloride flush  3 mL Intravenous Q12H  . spironolactone  25 mg Oral Daily  . thiamine  100 mg Oral Daily   Continuous Infusions: . sodium chloride 250 mL (08/09/19 1213)  . azithromycin 500 mg (08/15/19 0530)  . cefTRIAXone (ROCEPHIN)  IV 2 g (08/15/19 0657)   PRN Meds: sodium chloride, acetaminophen, hydrALAZINE, ipratropium-albuterol, ondansetron (ZOFRAN) IV, sodium chloride flush   Vital Signs    Vitals:   08/15/19 0500 08/15/19 0652 08/15/19 0742 08/15/19 0800  BP:  (!) 122/43  122/66  Pulse:  89  80  Resp:      Temp:    98.2 F (36.8 C)  TempSrc:    Oral  SpO2:   96% 95%  Weight: 65.8 kg     Height:        Intake/Output Summary (Last 24 hours) at 08/15/2019 1002 Last data filed at 08/15/2019 0100 Gross per 24 hour  Intake --  Output 700 ml  Net -700 ml   Filed Weights   08/09/19 0414 08/10/19 0332 08/15/19 0500  Weight: 66 kg 66 kg 65.8 kg    Telemetry    nsr - Personally Reviewed  ECG    none - Personally Reviewed  Physical Exam   GEN: No acute distress.   Neck: No  JVD Cardiac: RRR, no murmurs, rubs, or gallops.  Respiratory: Clear to auscultation bilaterally. GI: Soft, nontender, non-distended  MS: No edema; No deformity. Neuro:  Nonfocal  Psych: Normal affect   Labs    Chemistry Recent Labs  Lab 08/11/19 0102 08/12/19 0226 08/13/19 1027  NA 130* 129* 129*  K 4.4 3.9 3.7  CL 98 97* 98  CO2 24 22 22   GLUCOSE 127* 114* 179*  BUN 17 17 19   CREATININE 0.80 0.86 0.88  CALCIUM 9.0 8.9 9.0  ALBUMIN 2.2* 2.3*  --   GFRNONAA >60 >60 >60  GFRAA >60 >60 >60  ANIONGAP 8 10 9      Hematology Recent Labs  Lab 08/09/19 1634 08/09/19 1634 08/10/19 0122 08/11/19 0102 08/12/19 0226  WBC 9.7  --  6.8 6.3  --   RBC 3.84*  --  3.77* 3.98*  --   HGB 8.9*   < > 9.1* 9.4* 9.5*  HCT 29.3*   < > 29.2* 30.9* 30.3*  MCV 76.3*  --  77.5* 77.6*  --   MCH  23.2*  --  24.1* 23.6*  --   MCHC 30.4  --  31.2 30.4  --   RDW 16.4*  --  16.3* 16.5*  --   PLT 214  --  207 223  --    < > = values in this interval not displayed.    Cardiac EnzymesNo results for input(s): TROPONINI in the last 168 hours. No results for input(s): TROPIPOC in the last 168 hours.   BNPNo results for input(s): BNP, PROBNP in the last 168 hours.   DDimer No results for input(s): DDIMER in the last 168 hours.   Radiology    No results found.  Cardiac Studies   none  Patient Profile     69 y.o. male admitted with acute on chronic systolic/diastolic heart failure  Assessment & Plan    1. Acute on chronic combined CHF - he has lost 20 lbs since admit. He appears euvolemic. I think he can be discharged home on current meds. 2. HTN _ his bp is well controlled.  3. Acute respiratory failure - he has been treated with a course of anti-biotics.  4. CAD - no evidence of NSTEMI. His troponins unremarkable. No additional treatment.   [facility] will sign off.   Medication Recommendations:  Continue current meds Other recommendations (labs, testing, etc):  followup in TOC  heart care in 7-10 days Follow up as an outpatient:  With Dr. Johnsie Cancel in 4-6 weeks  For questions or updates, please contact [facility] Please consult [email or web address] for contact info under Cardiology/STEMI.      Signed, Cristopher Peru, MD  08/15/2019, 10:02 AM  Patient ID: Caleb [name], male   DOB: October 27, 1950, 69 y.o.   MRN: [id number]

## 2019-08-16 DIAGNOSIS — R6510 Systemic inflammatory response syndrome (SIRS) of non-infectious origin without acute organ dysfunction: Secondary | ICD-10-CM | POA: Diagnosis not present

## 2019-08-16 MED ORDER — FERROUS SULFATE 325 (65 FE) MG PO TABS: 325.0000 mg | ORAL_TABLET | Freq: Every day | ORAL | 0 refills | Status: DC

## 2019-08-16 MED ORDER — ASPIRIN 81 MG PO CHEW: 81.0000 mg | CHEWABLE_TABLET | Freq: Every day | ORAL | 0 refills | Status: AC

## 2019-08-16 MED ORDER — THIAMINE HCL 100 MG PO TABS: 100.0000 mg | ORAL_TABLET | Freq: Every day | ORAL | 0 refills | Status: AC

## 2019-08-16 MED ORDER — IRBESARTAN 300 MG PO TABS: 300.0000 mg | ORAL_TABLET | Freq: Every day | ORAL | 0 refills | Status: DC

## 2019-08-16 MED ORDER — PREDNISONE 10 MG PO TABS
ORAL_TABLET | ORAL | 0 refills | Status: DC
Start: 2019-08-16 — End: 2019-08-31

## 2019-08-16 MED ORDER — FUROSEMIDE 20 MG PO TABS: 20.0000 mg | ORAL_TABLET | Freq: Every day | ORAL | 0 refills | Status: DC

## 2019-08-16 MED ORDER — RIVAROXABAN 2.5 MG PO TABS: 2.5000 mg | ORAL_TABLET | Freq: Two times a day (BID) | ORAL | 0 refills | Status: DC

## 2019-08-16 MED ORDER — PANTOPRAZOLE SODIUM 40 MG PO TBEC: 40.0000 mg | DELAYED_RELEASE_TABLET | Freq: Every day | ORAL | 0 refills | Status: DC

## 2019-08-16 MED ORDER — AMLODIPINE BESYLATE 5 MG PO TABS: 5.0000 mg | ORAL_TABLET | Freq: Every day | ORAL | 0 refills | Status: DC

## 2019-08-16 MED ORDER — SPIRONOLACTONE 25 MG PO TABS: 25.0000 mg | ORAL_TABLET | Freq: Every day | ORAL | 0 refills | Status: DC

## 2019-08-16 MED ORDER — FOLIC ACID 1 MG PO TABS: 1.0000 mg | ORAL_TABLET | Freq: Every day | ORAL | 0 refills | Status: AC

## 2019-08-16 MED ORDER — CARVEDILOL 25 MG PO TABS: 25.0000 mg | ORAL_TABLET | Freq: Two times a day (BID) | ORAL | 0 refills | Status: DC

## 2019-08-16 NOTE — Discharge Summary (Signed)
Physician Discharge Summary  Caleb [name] KPT:[id number] DOB: [birth year] DOA: 08/07/2019  PCP: Christain Sacramento, MD  Admit date: 08/07/2019 Discharge date: 08/16/2019  Admitted From: Home Discharge disposition: home with [facility] RN, PT   Code Status: Full Code  Diet Recommendation: cardiac diet  Discharge Diagnosis:   Principal Problem:   SIRS (systemic inflammatory response syndrome) ([place]) Active Problems:   COPD (chronic obstructive pulmonary disease) ([place])   Acute respiratory failure with hypoxia (HCC)   Elevated troponin   Acute systolic (congestive) heart failure (HCC)   Tobacco abuse   Essential hypertension   CHF (congestive heart failure) (HCC)   S/P CABG x 3   Hyponatremia   PVD (peripheral vascular disease) ([place])   Chronic anticoagulation   History of Present Illness / Brief narrative:  Caleb [name] is a 69 y.o. male with chronic smoking, chronic alcohol use, PMH of HTN, HLD, CAD s/p CABG, CHF, COPD not on home oxygen, peripheral artery disease. Patient presented to the ED on 08/07/2019 with complaint of shortness of breath, cough, wheezing, intermittent diaphoresis, emesis, malaise and fever for 2 days prior to arrival.  He was admitted for acute respiratory failure with hypoxia due to acute CHF, COPD and possible pneumonia.   Hospital Course:   Acute respiratory failure with hypoxia -multifactorial including CHF, pneumonia, anemia and COPD exacerbation.  -Required BiPAP on admission. Gradually weaned down, currently not using supplemental oxygen at rest.  Does not use oxygen at home.    Sepsis likely due to community-acquired pneumonia-POA.  COPD exacerbation -Met sepsis criteria on admission with fever, leukocytosis.   -Elevated procalcitonin.  Chest x-ray showed atelectasis but patient's presentation was clinically consistent with pneumonia.  -Completed 5-day course of IV Rocephin and azithromycin. -Currently on tapering course of prednisone, received 20 mg this  morning.  Taper to finish in next few days. -Continue budesonide and nebulizers.   -OOB/IS/PT/OT  Acute on chronic systolicCHF:  -Had some evidence of fluid overload with elevated BNP to 1100 and concerning CXR.  -Echo 7/23 with EF of 45 to 50% and global hypokinesis (60 to 65% in 07/2018).  -Cardiology consult appreciated.  Currently on Lasix 40 mg daily. -Currently appears euvolemic except for chronic RLE swelling. Renal function stable. -Switch Lasix down to 10 mg daily at discharge.  Elevated troponin:  -felt to be demand ischemia in the setting of the above. No chest pain or anginal symptoms. -On aspirin per cardiology  Uncontrolled hypertension -Patient has had blood pressure elevated up to 200s.   -Cardiology consultation appreciated.  Medications adjusted.   -Currently on Coreg 25 mg twice daily, Lasix 40 mg daily, irbesartan 300 mg daily, Aldactone 25 mg daily and amlodipine 5 mg daily. -Blood pressure has been stable and last urinalysis.  Peripheral vascular disease:  -patient with prior history of right first digit amputation in 2019. -Continue statin and aspirin. -ReducedXarelto to 2.5 mg twice daily  CAD:  -Patient with prior history of three-vessel CABG back in 2016. -Continue statin and aspirin  Hyperlipidemia:  -patient is on atorvastatin 80 mg nightly.  Tobacco abuse:  -Smokes about 10 cigarettes a day -Counseled on the need for cessation of tobacco use -Nicotine patch offered  Iron deficiency anemia:  History of gastritis -Baseline Hgb 8 to 9. 7.6 (admit)> 7.1>1u> 8.9> 9.4. Iron 7. Iron sat 2%. Colo in 10/2018 with 22 to 30 mm sessile polyps that were resected. EGD revealed gastritis with hemorrhage and sessile polyps in duodenum. Repeat colonoscopy is recommended but  didn't seem to follow up. He denies melena or hematochezia. He is on high dose Xarelto for PVD. No Hx of PE/DVT or A. fib. He is also on aspirin. -Remains currently stable,  9.5 on last blood work on 7/29. -Previous hospitalist reducedXarelto to 2.5 mg. Recommended dose for PVD. -Patient was given 1 dose of IV Ferahemeon 7/26. Started oral iron supplement. -Continue Protonix 40 mg daily -Need outpatient GI follow-up with Dr. Benson Norway for repeat colonoscopy  Acute on chronic hyponatremia: -Na 124 on admit >> 132 > 130. Urine Na slightly high at 43.  -Likely due to fluid overload and alcohol. Cortisol slightly low.TSH was normal. -Sodium 129 on last blood work on 7/29.  Alcohol abuse:  -Prior to this hospitalization, patient was drinkingfour12 ounce beers per day on average. -No withdrawal symptoms. -Continue vitamins.  Impaired mobility -PT eval obtained.  Was recommended.  Patient and family would prefer him at home.  Discharge home with home health PT, RN.  Wound care:    Subjective:  Seen and examined this morning.  Pleasant elderly Caucasian male.  Sitting up in bed.  Not in distress.   Wants to go home.  Discharge Exam:   Vitals:   08/16/19 0200 08/16/19 0400 08/16/19 0500 08/16/19 0803  BP: (!) 155/52     Pulse: 74     Resp: 18 18    Temp: 98 F (36.7 C) 98.2 F (36.8 C)    TempSrc: Oral Oral    SpO2: 97%   98%  Weight:   65.6 kg   Height:        Body mass index is 24.82 kg/m.  General exam: Appears calm and comfortable.  Skin: No rashes, lesions or ulcers. HEENT: Atraumatic, normocephalic, supple neck, no obvious bleeding Lungs: Clear to auscultation bilaterally CVS: Regular rate and rhythm, no murmur GI/Abd soft, nontender, nondistended, bowel sound present CNS: Alert, awake, alert x3 Psychiatry: Mood appropriate Extremities: No pedal edema, no calf tenderness  Follow ups:   Discharge Instructions    Increase activity slowly   Complete by: As directed       Contact information for follow-up providers    Leanor Kail, PA Follow up on 08/31/2019.   Specialty: Cardiology Why: Please arrive 15 minutes  early for your 2:15pm post-hospital follow-up.  Contact information: [address] [place] [address] [phone]        [facility], [facility] Follow up.   Specialty: [place] Why: for home health services Contact information: [place] [address] [phone]        Christain Sacramento, MD Follow up in 1 week(s).   Specialty: Family Medicine Contact information: [address] [place] [address] [phone]        Josue Hector, MD .   Specialty: Cardiology Contact information: [phone] [address] [place] [address] [phone]            Contact information for after-discharge care    [facility] .   Service: Skilled Nursing Contact information: [place] [phone]                  Recommendations for Outpatient Follow-Up:   1. Follow-up with PCP as an outpatient. 2. Follow-up with cardiology as an outpatient  Discharge Instructions:  Follow with Primary MD Christain Sacramento, MD in 7 days   Get CBC,  BMP checked in next visit within 1 week by PCP or SNF MD ( we routinely change or add medications that can affect your baseline labs and fluid status, therefore we recommend that you get the mentioned basic workup next visit with your PCP, your PCP may decide not to get them or add new tests based on their clinical decision)  On your next visit with your PCP, please Get Medicines reviewed and adjusted.  Please request your PCP  to go over all Hospital Tests and Procedure/Radiological results at the follow up, please get all Hospital records sent to your Prim MD by signing hospital release before you go home.  Activity: As tolerated with Full fall precautions use walker/cane & assistance as needed  For Heart failure patients - Check your Weight same time everyday, if you gain over 2 pounds, or you develop in  leg swelling, experience more shortness of breath or chest pain, call your Primary MD immediately. Follow Cardiac Low Salt Diet and 1.5 lit/day fluid restriction.  If you have smoked or chewed Tobacco in the last 2 yrs please stop smoking, stop any regular Alcohol  and or any Recreational drug use.  If you experience worsening of your admission symptoms, develop shortness of breath, life threatening emergency, suicidal or homicidal thoughts you must seek medical attention immediately by calling 911 or calling your MD immediately  if symptoms less severe.  You Must read complete instructions/literature along with all the possible adverse reactions/side effects for all the Medicines you take and that have been prescribed to you. Take any new Medicines after you have completely understood and accpet all the possible adverse reactions/side effects.   Do not drive, operate heavy machinery, perform activities at heights, swimming or participation in water activities or provide baby sitting services if your were admitted for syncope or siezures until you have seen by Primary MD or a Neurologist and advised to do so again.  Do not drive when taking Pain medications.  Do not take more than prescribed Pain, Sleep and Anxiety Medications  Wear Seat belts while driving.   Please note You were cared for by a hospitalist during your hospital stay. If you have any questions about your discharge medications or the care you received while you were in the hospital after you are discharged, you can call the unit and asked to speak with the hospitalist on call if the hospitalist that took care of you is not available. Once you are discharged, your primary care physician will handle any further medical issues. Please note that NO REFILLS for any discharge medications will be authorized once you are discharged, as it is imperative that you return to your primary care physician (or establish a relationship with a primary  care physician if you do not have one) for your aftercare needs so that they can reassess your need for medications and monitor your lab values.   Allergies as of 08/16/2019      Reactions   Sulfamethoxazole-trimethoprim Other (See Comments)   "Made potassium go high and sodium go low"   Cilostazol Swelling   Site of swelling not recalled by the patient   Lisinopril Cough      Medication List    TAKE these medications   acetaminophen 500 MG tablet Commonly known as: TYLENOL Take 500 mg by mouth 2 (two) times daily as needed for mild pain or moderate pain.   albuterol 108 (90 Base) MCG/ACT inhaler Commonly known as: VENTOLIN HFA Inhale 2 puffs into  the lungs every 6 (six) hours as needed for wheezing or shortness of breath.   amLODipine 5 MG tablet Commonly known as: NORVASC Take 1 tablet (5 mg total) by mouth daily. Start taking on: August 17, 2019 What changed:   medication strength  how much to take   aspirin 81 MG chewable tablet Chew 1 tablet (81 mg total) by mouth daily. Start taking on: August 17, 2019   atorvastatin 80 MG tablet Commonly known as: LIPITOR Take 1 tablet (80 mg total) by mouth daily.   carvedilol 25 MG tablet Commonly known as: COREG Take 1 tablet (25 mg total) by mouth 2 (two) times daily with a meal. What changed:   medication strength  how much to take  when to take this   ferrous sulfate 325 (65 FE) MG tablet Take 1 tablet (325 mg total) by mouth daily with breakfast. Start taking on: August 17, 4079   folic acid 1 MG tablet Commonly known as: FOLVITE Take 1 tablet (1 mg total) by mouth daily. Start taking on: August 17, 2019   furosemide 20 MG tablet Commonly known as: LASIX Take 1 tablet (20 mg total) by mouth daily. Start taking on: August 17, 2019   gabapentin 300 MG capsule Commonly known as: NEURONTIN Take 300 mg by mouth 3 (three) times daily.   irbesartan 300 MG tablet Commonly known as: AVAPRO Take 1 tablet (300 mg  total) by mouth daily. Start taking on: August 17, 2019   nitroGLYCERIN 0.4 MG SL tablet Commonly known as: NITROSTAT Place 1 tablet (0.4 mg total) under the tongue every 5 (five) minutes as needed for chest pain.   pantoprazole 40 MG tablet Commonly known as: PROTONIX Take 1 tablet (40 mg total) by mouth daily. Start taking on: August 17, 2019   predniSONE 10 MG tablet Commonly known as: DELTASONE Take 2 tablets (20 mg) daily for 3 days, then, Take 1 tablets (10 mg) daily for 3 days, then stop   rivaroxaban 2.5 MG Tabs tablet Commonly known as: XARELTO Take 1 tablet (2.5 mg total) by mouth 2 (two) times daily. What changed:   medication strength  how much to take  when to take this   spironolactone 25 MG tablet Commonly known as: ALDACTONE Take 1 tablet (25 mg total) by mouth daily. Start taking on: August 17, 2019   thiamine 100 MG tablet Take 1 tablet (100 mg total) by mouth daily. Start taking on: August 17, 2019   vitamin B-12 1000 MCG tablet Commonly known as: CYANOCOBALAMIN Take 1,000 mcg by mouth daily with breakfast.   VITAMIN D-3 PO Take 1 capsule by mouth daily with breakfast.       Time coordinating discharge: 35 minutes  The results of significant diagnostics from this hospitalization (including imaging, microbiology, ancillary and laboratory) are listed below for reference.    Procedures and Diagnostic Studies:   DG Chest Port 1 View  Result Date: 08/07/2019 CLINICAL DATA:  Worsening shortness of breath since yesterday. EXAM: PORTABLE CHEST 1 VIEW COMPARISON:  Chest x-ray 03/11/2017 FINDINGS: Stable surgical changes from bypass surgery. Mild stable cardiac enlargement. Stable tortuosity and calcification of the thoracic aorta. Mild central vascular congestion but no pulmonary edema. Low lung volumes with vascular crowding and streaky basilar atelectasis. Possible small right effusion. The bony thorax is intact. Remote healed bilateral rib fractures are  noted. IMPRESSION: 1. Low lung volumes with vascular crowding and streaky basilar atelectasis. 2. Possible small right effusion. Electronically Signed   By:  Marijo Sanes M.D.   On: 08/07/2019 06:34   ECHOCARDIOGRAM COMPLETE  Result Date: 08/07/2019    ECHOCARDIOGRAM REPORT   Patient Name:   MAXMILLIAN CARSEY Date of Exam: 08/07/2019 Medical Rec #:  [id number]   Height:       67.0 in Accession #:    [id number]  Weight:       165.3 lb Date of Birth:  [birth year]   BSA:          1.865 m Patient Age:    69 years    BP:           144/87 mmHg Patient Gender: M           HR:           76 bpm. Exam Location:  Inpatient Procedure: 2D Echo and Intracardiac Opacification Agent Indications:    CHF-Acute Diastolic 676.72 / C94.70  History:        Patient has prior history of Echocardiogram examinations, most                 recent 07/31/2018. CAD, Prior CABG, PAD and COPD; Risk                 Factors:Current Smoker, Hypertension and Dyslipidemia.  Sonographer:    Darlina Sicilian RDCS Referring Phys: [id number] [place]  1. Left ventricular ejection fraction, by estimation, is 45 to 50%. The left ventricle has mildly decreased function. The left ventricle demonstrates global hypokinesis. Left ventricular diastolic parameters are consistent with Grade II diastolic dysfunction (pseudonormalization). Elevated left ventricular end-diastolic pressure.  2. Right ventricular systolic function is moderately reduced. The right ventricular size is normal. There is normal pulmonary artery systolic pressure.  3. Left atrial size was mild to moderately dilated.  4. The mitral valve is normal in structure. Mild mitral valve regurgitation. No evidence of mitral stenosis.  5. Tricuspid valve regurgitation is mild to moderate.  6. The aortic valve is tricuspid. Aortic valve regurgitation is not visualized. No aortic stenosis is present.  7. The inferior vena cava is normal in size with greater than 50% respiratory variability,  suggesting right atrial pressure of 3 mmHg. Comparison(s): Changes from prior study are noted. Compared to prior study, mildly reduced LV and moderately reduced RV function without focal wall motion abnormalities. FINDINGS  Left Ventricle: Left ventricular ejection fraction, by estimation, is 45 to 50%. The left ventricle has mildly decreased function. The left ventricle demonstrates global hypokinesis. Definity contrast agent was given IV to delineate the left ventricular  endocardial borders. The left ventricular internal cavity size was normal in size. There is no left ventricular hypertrophy. Left ventricular diastolic parameters are consistent with Grade II diastolic dysfunction (pseudonormalization). Elevated left ventricular end-diastolic pressure. Right Ventricle: The right ventricular size is normal. Right vetricular wall thickness was not assessed. Right ventricular systolic function is moderately reduced. There is normal pulmonary artery systolic pressure. The tricuspid regurgitant velocity is 2.87 m/s, and with an assumed right atrial pressure of 3 mmHg, the estimated right ventricular systolic pressure is 29.4 mmHg. Left Atrium: Left atrial size was mild to moderately dilated. Right Atrium: Right atrial size was normal in size. Pericardium: There is no evidence of pericardial effusion. Mitral Valve: The mitral valve is normal in structure. Mild mitral valve regurgitation. No evidence of mitral valve stenosis. Tricuspid Valve: The tricuspid valve is normal in structure. Tricuspid valve regurgitation is mild to moderate. No evidence of tricuspid stenosis. Aortic Valve: The aortic  valve is tricuspid. Aortic valve regurgitation is not visualized. No aortic stenosis is present. Pulmonic Valve: The pulmonic valve was not well visualized. Pulmonic valve regurgitation is trivial. No evidence of pulmonic stenosis. Aorta: The aortic root, ascending aorta and aortic arch are all structurally normal, with no  evidence of dilitation or obstruction. Venous: The inferior vena cava is normal in size with greater than 50% respiratory variability, suggesting right atrial pressure of 3 mmHg. IAS/Shunts: The atrial septum is grossly normal.  LEFT VENTRICLE PLAX 2D LVIDd:         5.03 cm      Diastology LVIDs:         3.99 cm      LV e' lateral:   5.51 cm/s LV PW:         0.93 cm      LV E/e' lateral: 21.1 LV IVS:        1.06 cm      LV e' medial:    2.59 cm/s LVOT diam:     2.00 cm      LV E/e' medial:  44.8 LV SV:         67 LV SV Index:   36 LVOT Area:     3.14 cm  LV Volumes (MOD) LV vol d, MOD A2C: 130.0 ml LV vol d, MOD A4C: 123.0 ml LV vol s, MOD A2C: 72.5 ml LV vol s, MOD A4C: 65.4 ml LV SV MOD A2C:     57.5 ml LV SV MOD A4C:     123.0 ml LV SV MOD BP:      59.5 ml RIGHT VENTRICLE TAPSE (M-mode): 1.2 cm LEFT ATRIUM             Index       RIGHT ATRIUM           Index LA diam:        3.80 cm 2.04 cm/m  RA Area:     14.60 cm LA Vol (A2C):   74.3 ml 39.84 ml/m RA Volume:   36.00 ml  19.30 ml/m LA Vol (A4C):   44.9 ml 24.07 ml/m LA Biplane Vol: 58.9 ml 31.58 ml/m  AORTIC VALVE LVOT Vmax:   84.90 cm/s LVOT Vmean:  59.400 cm/s LVOT VTI:    0.213 m  AORTA Ao Root diam: 3.30 cm MITRAL VALVE                TRICUSPID VALVE MV Area (PHT): 7.16 cm     TR Peak grad:   32.9 mmHg MV Decel Time: 106 msec     TR Vmax:        287.00 cm/s MV E velocity: 116.00 cm/s MV A velocity: 72.80 cm/s   SHUNTS MV E/A ratio:  1.59         Systemic VTI:  0.21 m                             Systemic Diam: 2.00 cm Buford Dresser MD Electronically signed by Buford Dresser MD Signature Date/Time: 08/07/2019/1:43:58 PM    Final      Labs:   Basic Metabolic Panel: Recent Labs  Lab 08/10/19 0122 08/10/19 0122 08/11/19 0102 08/11/19 0102 08/12/19 0226 08/13/19 1027  NA 129*  --  130*  --  129* 129*  K 4.5   < > 4.4   < > 3.9 3.7  CL 97*  --  98  --  97* 98  CO2 25  --  24  --  22 22  GLUCOSE 133*  --  127*  --  114* 179*    BUN 18  --  17  --  17 19  CREATININE 0.95  --  0.80  --  0.86 0.88  CALCIUM 8.6*  --  9.0  --  8.9 9.0  MG 2.0  --  2.2  --  2.1  --   PHOS  --   --  2.6  --  3.5  --    < > = values in this interval not displayed.   GFR Estimated Creatinine Clearance: 65.4 mL/min (by C-G formula based on SCr of 0.88 mg/dL). Liver Function Tests: Recent Labs  Lab 08/11/19 0102 08/12/19 0226  ALBUMIN 2.2* 2.3*   No results for input(s): LIPASE, AMYLASE in the last 168 hours. No results for input(s): AMMONIA in the last 168 hours. Coagulation profile No results for input(s): INR, PROTIME in the last 168 hours.  CBC: Recent Labs  Lab 08/09/19 1634 08/10/19 0122 08/11/19 0102 08/12/19 0226  WBC 9.7 6.8 6.3  --   HGB 8.9* 9.1* 9.4* 9.5*  HCT 29.3* 29.2* 30.9* 30.3*  MCV 76.3* 77.5* 77.6*  --   PLT 214 207 223  --    Cardiac Enzymes: No results for input(s): CKTOTAL, CKMB, CKMBINDEX, TROPONINI in the last 168 hours. BNP: Invalid input(s): POCBNP CBG: No results for input(s): GLUCAP in the last 168 hours. D-Dimer No results for input(s): DDIMER in the last 72 hours. Hgb A1c No results for input(s): HGBA1C in the last 72 hours. Lipid Profile No results for input(s): CHOL, HDL, LDLCALC, TRIG, CHOLHDL, LDLDIRECT in the last 72 hours. Thyroid function studies No results for input(s): TSH, T4TOTAL, T3FREE, THYROIDAB in the last 72 hours.  Invalid input(s): FREET3 Anemia work up No results for input(s): VITAMINB12, FOLATE, FERRITIN, TIBC, IRON, RETICCTPCT in the last 72 hours. Microbiology Recent Results (from the past 240 hour(s))  SARS Coronavirus 2 by RT PCR (hospital order, performed in [facility] hospital lab) Nasopharyngeal Nasopharyngeal Swab     Status: None   Collection Time: 08/07/19  5:06 AM   Specimen: Nasopharyngeal Swab  Result Value Ref Range Status   SARS Coronavirus 2 NEGATIVE NEGATIVE Final    Comment: (NOTE) SARS-CoV-2 target nucleic acids are NOT DETECTED.  The  SARS-CoV-2 RNA is generally detectable in upper and lower respiratory specimens during the acute phase of infection. The lowest concentration of SARS-CoV-2 viral copies this assay can detect is 250 copies / mL. A negative result does not preclude SARS-CoV-2 infection and should not be used as the sole basis for treatment or other patient management decisions.  A negative result may occur with improper specimen collection / handling, submission of specimen other than nasopharyngeal swab, presence of viral mutation(s) within the areas targeted by this assay, and inadequate number of viral copies (<250 copies / mL). A negative result must be combined with clinical observations, patient history, and epidemiological information.  Fact Sheet for Patients:   StrictlyIdeas.no  Fact Sheet for Healthcare Providers: BankingDealers.co.za  This test is not yet approved or  cleared by the [place] FDA and has been authorized for detection and/or diagnosis of SARS-CoV-2 by FDA under an Emergency Use Authorization (EUA).  This EUA will remain in effect (meaning this test can be used) for the duration of the COVID-19 declaration under Section 564(b)(1) of the Act, 21 U.S.C. section 360bbb-3(b)(1), unless the authorization is  terminated or revoked sooner.  Performed at [facility], [place] [address] [place], [place] [address]   Urine culture     Status: None   Collection Time: 08/07/19  5:58 AM   Specimen: In/Out Cath Urine  Result Value Ref Range Status   Specimen Description IN/OUT CATH URINE  Final   Special Requests NONE  Final   Culture   Final    NO GROWTH Performed at [facility], [place] [address], [place], [place] [address]    Report Status 08/08/2019 FINAL  Final  Blood Culture (routine x 2)     Status: None   Collection Time: 08/07/19  6:05 AM   Specimen: BLOOD RIGHT FOREARM  Result Value Ref Range Status   Specimen  Description BLOOD RIGHT FOREARM  Final   Special Requests   Final    BOTTLES DRAWN AEROBIC AND ANAEROBIC Blood Culture adequate volume   Culture   Final    NO GROWTH 6 DAYS Performed at [facility], [place] [address] [place], [place] [address]    Report Status 08/13/2019 FINAL  Final  Blood Culture (routine x 2)     Status: None   Collection Time: 08/07/19  6:20 AM   Specimen: BLOOD LEFT HAND  Result Value Ref Range Status   Specimen Description BLOOD LEFT HAND  Final   Special Requests   Final    BOTTLES DRAWN AEROBIC AND ANAEROBIC Blood Culture results may not be optimal due to an inadequate volume of blood received in culture bottles   Culture   Final    NO GROWTH 6 DAYS Performed at [facility], [place] [address] [place], [place] [address]    Report Status 08/13/2019 FINAL  Final  Respiratory Panel by PCR     Status: None   Collection Time: 08/07/19  8:47 AM   Specimen: Nasopharyngeal Swab; Respiratory  Result Value Ref Range Status   Adenovirus NOT DETECTED NOT DETECTED Final   Coronavirus 229E NOT DETECTED NOT DETECTED Final    Comment: (NOTE) The Coronavirus on the Respiratory Panel, DOES NOT test for the novel  Coronavirus (2019 nCoV)    Coronavirus HKU1 NOT DETECTED NOT DETECTED Final   Coronavirus NL63 NOT DETECTED NOT DETECTED Final   Coronavirus OC43 NOT DETECTED NOT DETECTED Final   Metapneumovirus NOT DETECTED NOT DETECTED Final   Rhinovirus / Enterovirus NOT DETECTED NOT DETECTED Final   Influenza A NOT DETECTED NOT DETECTED Final   Influenza B NOT DETECTED NOT DETECTED Final   Parainfluenza Virus 1 NOT DETECTED NOT DETECTED Final   Parainfluenza Virus 2 NOT DETECTED NOT DETECTED Final   Parainfluenza Virus 3 NOT DETECTED NOT DETECTED Final   Parainfluenza Virus 4 NOT DETECTED NOT DETECTED Final   Respiratory Syncytial Virus NOT DETECTED NOT DETECTED Final   Bordetella pertussis NOT DETECTED NOT DETECTED Final   Chlamydophila pneumoniae NOT DETECTED  NOT DETECTED Final   Mycoplasma pneumoniae NOT DETECTED NOT DETECTED Final    Comment: Performed at [facility], [place]. [address] [place], [place] [address]     Signed: Terrilee Croak  [facility] 08/16/2019, 11:24 AM

## 2019-08-16 NOTE — Discharge Instructions (Signed)
Heart Failure, Diagnosis  Heart failure is a condition in which the heart has trouble pumping blood because it has become weak or stiff. This means that the heart does not pump blood well enough for the body to stay healthy. For some people with heart failure, fluid may back up into the lungs. There may also be swelling (edema) in the lower legs. Heart failure is usually a long-term (chronic) condition. It is important for you to take good care of yourself and follow the treatment plan from your health care provider. What are the causes? This condition may be caused by:  High blood pressure (hypertension). Hypertension causes the heart muscle to work harder than normal. This makes the heart stiff or weak.  Coronary artery disease, or CAD. CAD is the buildup of cholesterol and fat (plaque) in the arteries of the heart.  Heart attack, also called myocardial infarction. This injures the heart muscle, making it hard for the heart to pump blood.  Abnormal heart valves. The valves do not open and close properly, forcing the heart to pump harder to keep the blood flowing.  Heart muscle disease (cardiomyopathy or myocarditis). This is damage to the heart muscle. It can increase the risk of heart failure.  Lung disease. The heart works harder when the lungs are not healthy.  Abnormal heart rhythms. These can lead to heart failure. What increases the risk? The risk of heart failure increases as a person ages. This condition is also more likely to develop in people who:  Are overweight.  Are male.  Smoke or chew tobacco.  Abuse alcohol or illegal drugs.  Have taken medicines that can damage the heart, such as chemotherapy drugs.  Have diabetes.  Have abnormal heart rhythms.  Have thyroid problems.  Have low blood counts (anemia). What are the signs or symptoms? Symptoms of this condition include:  Shortness of breath with activity, such as when climbing stairs.  A cough that does not  go away.  Swelling of the feet, ankles, legs, or abdomen.  Losing weight for no reason.  Trouble breathing when lying flat (orthopnea).  Waking from sleep because of the need to sit up and get more air.  Rapid heartbeat.  Tiredness (fatigue) and loss of energy.  Feeling light-headed, dizzy, or close to fainting.  Loss of appetite.  Nausea.  Waking up more often during the night to urinate (nocturia).  Confusion. How is this diagnosed? This condition is diagnosed based on:  Your medical history, symptoms, and a physical exam.  Diagnostic tests, which may include: ? Echocardiogram. ? Electrocardiogram (ECG). ? Chest X-ray. ? Blood tests. ? Exercise stress test. ? Radionuclide scans. ? Cardiac catheterization and angiogram. How is this treated? Treatment for this condition is aimed at managing the symptoms of heart failure. Medicines Treatment may include medicines that:  Help lower blood pressure by relaxing (dilating) the blood vessels. These medicines are called ACE inhibitors (angiotensin-converting enzyme) and ARBs (angiotensin receptor blockers).  Cause the kidneys to remove salt and water from the blood through urination (diuretics).  Improve heart muscle strength and prevent the heart from beating too fast (beta blockers).  Increase the force of the heartbeat (digoxin). Healthy behavior changes     Treatment may also include making healthy lifestyle changes, such as:  Reaching and staying at a healthy weight.  Quitting smoking or chewing tobacco.  Eating heart-healthy foods.  Limiting or avoiding alcohol.  Stopping the use of illegal drugs.  Being physically active.  Other treatments   Other treatments may include:  Procedures to open blocked arteries or repair damaged valves.  Placing a pacemaker to improve heart function (cardiac resynchronization therapy).  Placing a device to treat serious abnormal heart rhythms (implantable cardioverter  defibrillator, or ICD).  Placing a device to improve the pumping ability of the heart (left ventricular assist device, or LVAD).  Receiving a healthy heart from a donor (heart transplant). This is done when other treatments have not helped. Follow these instructions at home:  Manage other health conditions as told by your health care provider. These may include hypertension, diabetes, thyroid disease, or abnormal heart rhythms.  Get ongoing education and support as needed. Learn as much as you can about heart failure.  Keep all follow-up visits as told by your health care provider. This is important. Summary  Heart failure is a condition in which the heart has trouble pumping blood because it has become weak or stiff.  This condition is caused by high blood pressure and other diseases of the heart and lungs.  Symptoms of this condition include shortness of breath, tiredness (fatigue), nausea, and swelling of the feet, ankles, legs, or abdomen.  Treatments for this condition may include medicines, lifestyle changes, and surgery.  Manage other health conditions as told by your health care provider. This information is not intended to replace advice given to you by your health care provider. Make sure you discuss any questions you have with your health care provider. Document Revised: 03/21/2018 Document Reviewed: 03/21/2018 Elsevier Patient Education  [place].   ==================================================================================================================================================== Information on my medicine - XARELTO (rivaroxaban)  [place]? Xarelto was prescribed to reduce the risk of blood clots forming in the veins of your legs, due to peripheral vascular disease..  What do you need to know about Xarelto?  Your dose is 2.5 mg TWICE daily.  DO NOT stop taking Xarelto without talking to the health care provider  who prescribed the medication.  Refill your prescription before you run out.  After discharge, you should have regular check-up appointments with your healthcare provider that is prescribing your Xarelto.  In the future your dose may need to be changed if your kidney function changes by a significant amount.  What do you do if you miss a dose? If you are taking Xarelto TWICE DAILY and you miss a dose, take it as soon as you remember.   Important Safety Information Xarelto is a blood thinner medicine that can cause bleeding. You should call your healthcare provider right away if you experience any of the following: ? Bleeding from an injury or your nose that does not stop. ? Unusual colored urine (red or dark brown) or unusual colored stools (red or black). ? Unusual bruising for unknown reasons. ? A serious fall or if you hit your head (even if there is no bleeding).  Some medicines may interact with Xarelto and might increase your risk of bleeding while on Xarelto. To help avoid this, consult your healthcare provider or pharmacist prior to using any new prescription or non-prescription medications, including herbals, vitamins, non-steroidal anti-inflammatory drugs (NSAIDs) and supplements.  This website has more information on Xarelto: [email or web address]

## 2019-08-16 NOTE — TOC Transition Note (Signed)
Transition of Care [facility]) - CM/SW Discharge Note   Patient Details  Name: Caleb [name] MRN: [id number] Date of Birth: 1950/05/05  Transition of Care [facility]) CM/SW Contact:  Carles Collet, RN Phone Number: 08/16/2019, 10:59 AM   Clinical Narrative:    Spoke w patient, confirmed he wanted to DC to home, not SNF. He deferred [facility] choice to daughter. Poke w daughter. She confirmed thathe has RW and shower seat at home, declined need for additional need, as did patient previously. Discussed [place] providers and ratings and referral made to [facility]. Alvis Lemmings accepted for [facility] care.  Patient will need orders for [facility] PT OT.     Final next level of care: [place] Barriers to Discharge: No Barriers Identified   Patient Goals and CMS Choice Patient states their goals for this hospitalization and ongoing recovery are:: To return home CMS Medicare.gov Compare Post Acute Care list provided to:: Other (Comment Required) Choice offered to / list presented to : Adult Children  Discharge Placement                       Discharge Plan and Services                          HH Arranged: PT, OT HH Agency: [place] Date [facility] Agency Contacted: 08/16/19 Time [place]: [id number] Representative spoke with at [place]: [place] Determinants of Health ([place]) Interventions     Readmission Risk Interventions No flowsheet data found.

## 2019-08-18 NOTE — Telephone Encounter (Signed)
[name], Daughter of the patient called back returning Lynn's Call

## 2019-08-18 NOTE — Telephone Encounter (Addendum)
**Note De-Identified [place] Comes Obfuscation** No answer at the pts phone number so I contacted the patient's daughter Caleb [name] [facility]) regarding the pts discharge from [facility] on 08/16/2019.  [name] understands that the pt is to follow up with provider Robbie Lis, PA-c on 08/31/2019 at 2:15 at [facility] in [place], [place] [address]. [name] understands the pts discharge instructions? Yes [name] understands the pts medications and regiment? Yes [name] understands to bring all of the pts medications to this visit? Yes  Ask patient:  Are you enrolled in My Chart: The pts daughter Caleb [name] states that she has [facility] and will be the person communicating on the pts behalf through [facility].

## 2019-08-31 ENCOUNTER — Ambulatory Visit: Payer: Medicare HMO | Admitting: Physician Assistant

## 2019-08-31 ENCOUNTER — Encounter: Payer: Self-pay | Admitting: Physician Assistant

## 2019-08-31 ENCOUNTER — Other Ambulatory Visit: Payer: Self-pay

## 2019-08-31 VITALS — BP 90/40 | HR 66 | Ht 64.0 in | Wt 138.0 lb

## 2019-08-31 DIAGNOSIS — R0989 Other specified symptoms and signs involving the circulatory and respiratory systems: Secondary | ICD-10-CM

## 2019-08-31 DIAGNOSIS — I5042 Chronic combined systolic (congestive) and diastolic (congestive) heart failure: Secondary | ICD-10-CM

## 2019-08-31 DIAGNOSIS — I25810 Atherosclerosis of coronary artery bypass graft(s) without angina pectoris: Secondary | ICD-10-CM | POA: Diagnosis not present

## 2019-08-31 DIAGNOSIS — I739 Peripheral vascular disease, unspecified: Secondary | ICD-10-CM

## 2019-08-31 NOTE — Progress Notes (Signed)
Cardiology Office Note    Date:  08/31/2019   ID:  Caleb [name], DOB 02/16/1950, MRN [id number]  PCP:  Christain Sacramento, MD  Cardiologist:  Dr. Johnsie Cancel   Chief Complaint: Hospital follow up   History of Present Illness:   Caleb [name] is a 69 y.o. 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   Admitted 07/2019 with acute respiratory failure/SIRS felt to be multifactorial in setting of pneumonia, COPD exacerbation, and acute CHF.  COVID negative. Treated with abx. Echo showed LVEF of 45-50% with global hypokinesis, grade 2 diastolic dysfunction, and elevated LVEDP. Also noted to have moderately reduced RV systolic function. 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 Diuresed 7.8L. BP was high. Discharged on Irbesartan 300mg  daily, Coreg 25mg  twice daily, and Spironolactone 25mg  daily.   Here today for follow-up.  Blood pressure low at 90/40.  BP check 92/48.  Reports blood pressure of 120/60s at home.  His blood pressure was 125/52 and seen by his vascular doctor at [facility].  Reports stable weight at home.  No chest pain, shortness of breath, orthopnea, PND, syncope, lower extremity edema or melena.  Reviewed recent lab work by PCP.  He does rehabilitation at home twice a day without any limitation.  Past Medical History:  Diagnosis Date  . Acute respiratory failure with hypoxia ([place])   . Acute systolic congestive heart failure ([place]) 02/11/2014  . COPD (chronic obstructive pulmonary disease) ([place]) 02/11/2014  . Essential hypertension   . Hypercholesteremia   . 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 Tobacco abuse     Past Surgical History:  Procedure Laterality Date  . BIOPSY  10/31/2018    Procedure: BIOPSY;  Surgeon: Carol Ada, MD;  Location: [facility];  Service: Endoscopy;;  . cataract surgery  Bilateral 12-2017 and 01-2018  . COLONOSCOPY WITH PROPOFOL N/A 10/31/2018   Procedure: COLONOSCOPY WITH PROPOFOL;  Surgeon: Carol Ada, MD;  Location: [facility];  Service: Endoscopy;  Laterality: N/A;  . CORONARY ARTERY BYPASS GRAFT N/A 02/17/2014   Procedure: CORONARY ARTERY BYPASS GRAFTING (CABG);  Surgeon: Rexene Alberts, MD;  Location: [place];  Service: Open Heart Surgery;  Laterality: N/A;  Times 3 using bilateral mammary arteries and endoscopically harvested right saphenous vein  . ESOPHAGOGASTRODUODENOSCOPY (EGD) WITH PROPOFOL N/A 10/31/2018   Procedure: ESOPHAGOGASTRODUODENOSCOPY (EGD) WITH PROPOFOL;  Surgeon: Carol Ada, MD;  [facility];  Service: Endoscopy;  Laterality: N/A;  . FEMORAL ARTERY - FEMORAL ARTERY BYPASS GRAFT      right femoral artery to below-knee popliteal artery bypass with PTFE and right first ray amputation 04/25/17  . HEMOSTASIS CLIP PLACEMENT  10/31/2018   Procedure: HEMOSTASIS CLIP PLACEMENT;  Surgeon: Carol Ada, MD;  [facility];  Service: Endoscopy;;  . INTRAOPERATIVE TRANSESOPHAGEAL ECHOCARDIOGRAM N/A 02/17/2014   Procedure: INTRAOPERATIVE TRANSESOPHAGEAL ECHOCARDIOGRAM;  Surgeon: Rexene Alberts, MD;  Location: [place];  Service: Open Heart Surgery;  Laterality: N/A;  . LEFT HEART CATHETERIZATION WITH CORONARY ANGIOGRAM N/A 02/12/2014   Procedure: LEFT HEART CATHETERIZATION WITH CORONARY ANGIOGRAM;  Surgeon: Sinclair Grooms, MD;  Location: [facility] CATH LAB;  Service: Cardiovascular;  Laterality: N/A;  .  POLYPECTOMY  10/31/2018   Procedure: POLYPECTOMY;  Surgeon: Carol Ada, MD;  [facility];  Service: Endoscopy;;  . SUBMUCOSAL LIFTING INJECTION  10/31/2018   Procedure: SUBMUCOSAL LIFTING INJECTION;  Surgeon: Carol Ada, MD;  [facility];  Service: Endoscopy;;  . TOE AMPUTATION Right    right  great toe     Current Medications: Prior to Admission medications   Medication Sig Start Date End Date Taking? Authorizing Provider  acetaminophen (TYLENOL) 500 MG tablet Take 500 mg by mouth 2 (two) times daily as needed for mild pain or moderate pain.     [provider]  albuterol (VENTOLIN HFA) 108 (90 Base) MCG/ACT inhaler Inhale 2 puffs into the lungs every 6 (six) hours as needed for wheezing or shortness of breath. 07/24/18   Olalere, Cicero Duck A, MD  amLODipine (NORVASC) 5 MG tablet Take 1 tablet (5 mg total) by mouth daily. 08/17/19 09/16/19  Terrilee Croak, MD  aspirin 81 MG chewable tablet Chew 1 tablet (81 mg total) by mouth daily. 08/17/19 09/16/19  Terrilee Croak, MD  atorvastatin (LIPITOR) 80 MG tablet Take 1 tablet (80 mg total) by mouth daily. 12/14/15   Josue Hector, MD  carvedilol (COREG) 25 MG tablet Take 1 tablet (25 mg total) by mouth 2 (two) times daily with a meal. 08/16/19 09/15/19  Dahal, Marlowe Aschoff, MD  Cholecalciferol (VITAMIN D-3 PO) Take 1 capsule by mouth daily with breakfast.    [provider]  ferrous sulfate 325 (65 FE) MG tablet Take 1 tablet (325 mg total) by mouth daily with breakfast. 08/17/19 09/16/19  Dahal, Marlowe Aschoff, MD  folic acid (FOLVITE) 1 MG tablet Take 1 tablet (1 mg total) by mouth daily. 08/17/19 11/15/19  Terrilee Croak, MD  furosemide (LASIX) 20 MG tablet Take 1 tablet (20 mg total) by mouth daily. 08/17/19 09/16/19  Terrilee Croak, MD  gabapentin (NEURONTIN) 300 MG capsule Take 300 mg by mouth 3 (three) times daily.    [provider]  irbesartan (AVAPRO) 300 MG tablet Take 1 tablet (300 mg total) by mouth daily. 08/17/19 09/16/19  Terrilee Croak, MD  nitroGLYCERIN (NITROSTAT) 0.4 MG SL tablet Place 1 tablet (0.4 mg total) under the tongue every 5 (five) minutes as needed for chest pain. 02/03/19 08/06/20  Erlene Quan, PA-C  pantoprazole (PROTONIX) 40 MG tablet Take 1 tablet (40 mg total) by mouth daily. 08/17/19 09/16/19  Terrilee Croak, MD  predniSONE  (DELTASONE) 10 MG tablet Take 2 tablets (20 mg) daily for 3 days, then, Take 1 tablets (10 mg) daily for 3 days, then stop 08/16/19   Terrilee Croak, MD  rivaroxaban (XARELTO) 2.5 MG TABS tablet Take 1 tablet (2.5 mg total) by mouth 2 (two) times daily. 08/16/19 09/15/19  Terrilee Croak, MD  spironolactone (ALDACTONE) 25 MG tablet Take 1 tablet (25 mg total) by mouth daily. 08/17/19 09/16/19  Terrilee Croak, MD  thiamine 100 MG tablet Take 1 tablet (100 mg total) by mouth daily. 08/17/19 09/16/19  Terrilee Croak, MD  vitamin B-12 (CYANOCOBALAMIN) 1000 MCG tablet Take 1,000 mcg by mouth daily with breakfast.    [provider]    Allergies:   Sulfamethoxazole-trimethoprim, Cilostazol, and Lisinopril   Social History   Socioeconomic History  . Marital status: Single    Spouse name: Not on file  . Number of children: Not on file  . Years of education: Not on file  . Highest education level: Not on file  Occupational History  . Occupation: metal work  Tobacco  Use  . Smoking status: Current Every Day Smoker    Packs/day: 0.50    Years: 35.00    Pack years: 17.50    Types: Cigarettes    Last attempt to quit: 02/11/2014    Years since quitting: 5.5  . Smokeless tobacco: Never Used  . Tobacco comment: has quit twice before   Vaping Use  . Vaping Use: Never used  Substance and Sexual Activity  . Alcohol use: Yes    Alcohol/week: 0.0 standard drinks    Comment: Six pack a day of beer  . Drug use: Not on file  . Sexual activity: Not on file  Other Topics Concern  . Not on file  Social History Narrative  . Not on file   Social Determinants of Health   Financial Resource Strain:   . Difficulty of Paying Living Expenses:   Food Insecurity:   . Worried About Charity fundraiser in the Last Year:   . Arboriculturist in the Last Year:   Transportation Needs:   . Film/video editor (Medical):   [name] Lack of Transportation (Non-Medical):   Physical Activity:   . Days of Exercise per Week:    . Minutes of Exercise per Session:   Stress:   . Feeling of Stress :   Social Connections:   . Frequency of Communication with Friends and Family:   . Frequency of Social Gatherings with Friends and Family:   . Attends Religious Services:   . Active Member of Clubs or Organizations:   . Attends Archivist Meetings:   [name] Marital Status:      Family History:  The patient's family history includes Cancer in his sister; Diabetes in his brother and mother; Heart attack in his brother and mother.  ROS:   Please see the history of present illness.    ROS All other systems reviewed and are negative.   PHYSICAL EXAM:   VS:  BP (!) 90/40   Pulse 66   Ht 5\' 4"  (1.626 m)   Wt 138 lb (62.6 kg)   SpO2 99%   BMI 23.69 kg/m    GEN: Well nourished, well developed, in no acute distress  HEENT: normal  Neck: no JVD, carotid bruits, or masses Cardiac: RRR; no murmurs, rubs, or gallops,no edema  Respiratory:  clear to auscultation bilaterally, normal work of breathing GI: soft, nontender, nondistended, + BS MS: no deformity or atrophy  Skin: warm and dry, no rash Neuro:  Alert and Oriented x 3, Strength and sensation are intact Psych: euthymic mood, full affect  Wt Readings from Last 3 Encounters:  08/31/19 138 lb (62.6 kg)  08/16/19 144 lb 10 oz (65.6 kg)  04/10/19 149 lb (67.6 kg)      Studies/Labs Reviewed:   EKG:  EKG is not  ordered today.    Recent Labs: 08/07/2019: ALT 11; B Natriuretic Peptide 1,092.0 08/11/2019: Platelets 223; TSH 3.440 08/12/2019: Hemoglobin 9.5; Magnesium 2.1 08/13/2019: BUN 19; Creatinine, Ser 0.88; Potassium 3.7; Sodium 129   Lipid Panel    Component Value Date/Time   CHOL 176 12/27/2015 1448   TRIG 247 (H) 12/27/2015 1448   HDL 52 12/27/2015 1448   CHOLHDL 3.4 12/27/2015 1448   VLDL 49 (H) 12/27/2015 1448   LDLCALC 75 12/27/2015 1448    Additional studies/ records that were reviewed today include:   Echocardiogram: 08/07/19 1. Left  ventricular ejection fraction, by estimation, is 45 to 50%. The  left ventricle has  mildly decreased function. The left ventricle  demonstrates global hypokinesis. Left ventricular diastolic parameters are  consistent with Grade II diastolic  dysfunction (pseudonormalization). Elevated left ventricular end-diastolic  pressure.  2. Right ventricular systolic function is moderately reduced. The right  ventricular size is normal. There is normal pulmonary artery systolic  pressure.  3. Left atrial size was mild to moderately dilated.  4. The mitral valve is normal in structure. Mild mitral valve  regurgitation. No evidence of mitral stenosis.  5. Tricuspid valve regurgitation is mild to moderate.  6. The aortic valve is tricuspid. Aortic valve regurgitation is not  visualized. No aortic stenosis is present.  7. The inferior vena cava is normal in size with greater than 50%  respiratory variability, suggesting right atrial pressure of 3 mmHg.    ASSESSMENT & PLAN:    1. Chronic combined CHF - Echo 07/2019 showed LVEF of 45-50% with global hypokinesis, grade II DD and moderately reduced RV systolic function. 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  Weight is stable.  Euvolemic.  Continue current medication.  2. HTN -Blood pressure is soft today however runs normal at home.  No change in medical therapy today.  Advised to keep log of his blood pressure.  He will hold Coreg if symptomatic hypotension.  Reviewed in detail.  3. PVD - s/p digit amputation in past. On chronic xarelto.   4. CAD s/p CABG -No angina.  Aspirin, Coreg and statin.   Medication Adjustments/Labs and Tests Ordered: Current medicines are reviewed at length with the patient today.  Concerns regarding medicines are outlined above.  Medication changes, Labs and Tests ordered today are listed in the Patient Instructions below. Patient Instructions  Medication Instructions:  Your physician recommends that you  continue on your current medications as directed. Please refer to the Current Medication list given to you today.  *If you need a refill on your cardiac medications before your next appointment, please call your pharmacy*   Lab Work: None ordered  If you have labs (blood work) drawn today and your tests are completely normal, you will receive your results only by: [name] MyChart Message (if you have MyChart) OR . A paper copy in the mail If you have any lab test that is abnormal or we need to change your treatment, we will call you to review the results.   Testing/Procedures: None ordered   Follow-Up: At [facility], you and your health needs are our priority.  As part of our continuing mission to provide you with exceptional heart care, we have created designated Provider Care Teams.  These Care Teams include your primary Cardiologist (physician) and Advanced Practice Providers (APPs -  Physician Assistants and Nurse Practitioners) who all work together to provide you with the care you need, when you need it.  We recommend signing up for the patient portal called "MyChart".  Sign up information is provided on this After Visit Summary.  MyChart is used to connect with patients for Virtual Visits (Telemedicine).  Patients are able to view lab/test results, encounter notes, upcoming appointments, etc.  Non-urgent messages can be sent to your provider as well.   To learn more about what you can do with MyChart, go to [email or web address].    Your next appointment:   3 month(s)  The format for your next appointment:   In Person  Provider:   Jenkins Rouge, MD   Other Instructions      Signed, Leanor Kail, PA  08/31/2019 2:39 PM  [facility] [place], [place], [place]  [address] Phone: [phone]; Fax: [phone]

## 2019-08-31 NOTE — Patient Instructions (Signed)
Medication Instructions:  °Your physician recommends that you continue on your current medications as directed. Please refer to the Current Medication list given to you today. ° °*If you need a refill on your cardiac medications before your next appointment, please call your pharmacy* ° ° °Lab Work: °None ordered ° °If you have labs (blood work) drawn today and your tests are completely normal, you will receive your results only by: °• MyChart Message (if you have MyChart) OR °• A paper copy in the mail °If you have any lab test that is abnormal or we need to change your treatment, we will call you to review the results. ° ° °Testing/Procedures: °None ordered ° ° °Follow-Up: °At [facility], you and your health needs are our priority.  As part of our continuing mission to provide you with exceptional heart care, we have created designated Provider Care Teams.  These Care Teams include your primary Cardiologist (physician) and Advanced Practice Providers (APPs -  Physician Assistants and Nurse Practitioners) who all work together to provide you with the care you need, when you need it. ° °We recommend signing up for the patient portal called "MyChart".  Sign up information is provided on this After Visit Summary.  MyChart is used to connect with patients for Virtual Visits (Telemedicine).  Patients are able to view lab/test results, encounter notes, upcoming appointments, etc.  Non-urgent messages can be sent to your provider as well.   °To learn more about what you can do with MyChart, go to [email or web address].   ° °Your next appointment:   °3 month(s) ° °The format for your next appointment:   °In Person ° °Provider:   °Peter Nishan, MD ° ° °Other Instructions ° ° °

## 2019-09-09 ENCOUNTER — Other Ambulatory Visit: Payer: Self-pay | Admitting: Gastroenterology

## 2019-09-15 ENCOUNTER — Other Ambulatory Visit (HOSPITAL_COMMUNITY)
Admission: RE | Admit: 2019-09-15 | Discharge: 2019-09-15 | Disposition: A | Discharge status: Home / Self Care | Payer: Medicare HMO | Source: Ambulatory Visit | Attending: Gastroenterology | Admitting: Gastroenterology

## 2019-09-15 DIAGNOSIS — Z20822 Contact with and (suspected) exposure to covid-19: Secondary | ICD-10-CM | POA: Insufficient documentation

## 2019-09-15 DIAGNOSIS — Z01812 Encounter for preprocedural laboratory examination: Secondary | ICD-10-CM | POA: Diagnosis present

## 2019-09-15 LAB — SARS CORONAVIRUS 2 (TAT 6-24 HRS): SARS Coronavirus 2: NEGATIVE

## 2019-09-18 ENCOUNTER — Encounter (HOSPITAL_COMMUNITY)
Admission: RE | Disposition: A | Discharge status: Home / Self Care | Payer: Self-pay | Source: Home / Self Care | Attending: Gastroenterology

## 2019-09-18 ENCOUNTER — Other Ambulatory Visit: Payer: Self-pay

## 2019-09-18 ENCOUNTER — Ambulatory Visit (HOSPITAL_COMMUNITY)
Admission: RE | Admit: 2019-09-18 | Discharge: 2019-09-18 | Disposition: A | Discharge status: Home / Self Care | Payer: Medicare HMO | Attending: Gastroenterology | Admitting: Gastroenterology

## 2019-09-18 ENCOUNTER — Ambulatory Visit (HOSPITAL_COMMUNITY): Payer: Medicare HMO | Admitting: Anesthesiology

## 2019-09-18 ENCOUNTER — Encounter (HOSPITAL_COMMUNITY): Payer: Self-pay | Admitting: Gastroenterology

## 2019-09-18 DIAGNOSIS — D122 Benign neoplasm of ascending colon: Secondary | ICD-10-CM | POA: Insufficient documentation

## 2019-09-18 DIAGNOSIS — I110 Hypertensive heart disease with heart failure: Secondary | ICD-10-CM | POA: Insufficient documentation

## 2019-09-18 DIAGNOSIS — J449 Chronic obstructive pulmonary disease, unspecified: Secondary | ICD-10-CM | POA: Insufficient documentation

## 2019-09-18 DIAGNOSIS — D125 Benign neoplasm of sigmoid colon: Secondary | ICD-10-CM | POA: Diagnosis present

## 2019-09-18 DIAGNOSIS — E7800 Pure hypercholesterolemia, unspecified: Secondary | ICD-10-CM | POA: Diagnosis not present

## 2019-09-18 DIAGNOSIS — E785 Hyperlipidemia, unspecified: Secondary | ICD-10-CM | POA: Insufficient documentation

## 2019-09-18 DIAGNOSIS — F17210 Nicotine dependence, cigarettes, uncomplicated: Secondary | ICD-10-CM | POA: Insufficient documentation

## 2019-09-18 DIAGNOSIS — Z7982 Long term (current) use of aspirin: Secondary | ICD-10-CM | POA: Diagnosis not present

## 2019-09-18 DIAGNOSIS — I2510 Atherosclerotic heart disease of native coronary artery without angina pectoris: Secondary | ICD-10-CM | POA: Insufficient documentation

## 2019-09-18 DIAGNOSIS — Z951 Presence of aortocoronary bypass graft: Secondary | ICD-10-CM | POA: Diagnosis not present

## 2019-09-18 DIAGNOSIS — K5730 Diverticulosis of large intestine without perforation or abscess without bleeding: Secondary | ICD-10-CM | POA: Insufficient documentation

## 2019-09-18 DIAGNOSIS — D509 Iron deficiency anemia, unspecified: Secondary | ICD-10-CM | POA: Diagnosis not present

## 2019-09-18 DIAGNOSIS — R12 Heartburn: Secondary | ICD-10-CM | POA: Insufficient documentation

## 2019-09-18 DIAGNOSIS — I5020 Unspecified systolic (congestive) heart failure: Secondary | ICD-10-CM | POA: Diagnosis not present

## 2019-09-18 DIAGNOSIS — D120 Benign neoplasm of cecum: Secondary | ICD-10-CM | POA: Insufficient documentation

## 2019-09-18 DIAGNOSIS — D124 Benign neoplasm of descending colon: Secondary | ICD-10-CM | POA: Diagnosis not present

## 2019-09-18 DIAGNOSIS — I739 Peripheral vascular disease, unspecified: Secondary | ICD-10-CM | POA: Diagnosis not present

## 2019-09-18 DIAGNOSIS — Z79899 Other long term (current) drug therapy: Secondary | ICD-10-CM | POA: Diagnosis not present

## 2019-09-18 HISTORY — PX: ESOPHAGOGASTRODUODENOSCOPY (EGD) WITH PROPOFOL: SHX5813

## 2019-09-18 HISTORY — PX: COLONOSCOPY WITH PROPOFOL: SHX5780

## 2019-09-18 HISTORY — PX: POLYPECTOMY: SHX5525

## 2019-09-18 HISTORY — PX: SUBMUCOSAL INJECTION: SHX5543

## 2019-09-18 HISTORY — PX: HEMOSTASIS CLIP PLACEMENT: SHX6857

## 2019-09-18 HISTORY — PX: SCLEROTHERAPY: SHX6841

## 2019-09-18 SURGERY — ESOPHAGOGASTRODUODENOSCOPY (EGD) WITH PROPOFOL
Anesthesia: Monitor Anesthesia Care

## 2019-09-18 MED ORDER — PROPOFOL 500 MG/50ML IV EMUL
INTRAVENOUS | Status: DC | PRN
  Administered 2019-09-18: 175 ug/kg/min via INTRAVENOUS

## 2019-09-18 MED ORDER — PHENYLEPHRINE 40 MCG/ML (10ML) SYRINGE FOR IV PUSH (FOR BLOOD PRESSURE SUPPORT)
PREFILLED_SYRINGE | INTRAVENOUS | Status: DC | PRN
  Administered 2019-09-18 (×2): 80 ug via INTRAVENOUS

## 2019-09-18 MED ORDER — PROPOFOL 10 MG/ML IV BOLUS
INTRAVENOUS | Status: DC | PRN
  Administered 2019-09-18: 60 mg via INTRAVENOUS
  Administered 2019-09-18: 20 mg via INTRAVENOUS

## 2019-09-18 MED ORDER — SPOT INK MARKER SYRINGE KIT
PACK | SUBMUCOSAL | Status: AC
  Filled 2019-09-18: qty 5

## 2019-09-18 MED ORDER — SODIUM CHLORIDE (PF) 0.9 % IJ SOLN
PREFILLED_SYRINGE | INTRAMUSCULAR | Status: DC | PRN
  Administered 2019-09-18: 3 mL

## 2019-09-18 MED ORDER — SPOT INK MARKER SYRINGE KIT
PACK | SUBMUCOSAL | Status: DC | PRN
  Administered 2019-09-18: 3 mL via SUBMUCOSAL

## 2019-09-18 MED ORDER — LACTATED RINGERS IV SOLN: INTRAVENOUS | Status: DC

## 2019-09-18 MED ORDER — LIDOCAINE HCL (CARDIAC) PF 100 MG/5ML IV SOSY
PREFILLED_SYRINGE | INTRAVENOUS | Status: DC | PRN
  Administered 2019-09-18: 100 mg via INTRAVENOUS

## 2019-09-18 MED ORDER — EPINEPHRINE 1 MG/10ML IJ SOSY
PREFILLED_SYRINGE | INTRAMUSCULAR | Status: AC
  Filled 2019-09-18: qty 10

## 2019-09-18 SURGICAL SUPPLY — 25 items

## 2019-09-18 NOTE — Transfer of Care (Signed)
2Immediate Anesthesia Transfer of Care Note  Patient: Caleb [name]  Procedure(s) Performed: ESOPHAGOGASTRODUODENOSCOPY (EGD) WITH PROPOFOL (N/A ) COLONOSCOPY WITH PROPOFOL (N/A ) SCLEROTHERAPY HEMOSTASIS CLIP PLACEMENT  Patient Location: PACU and Endoscopy Unit  Anesthesia Type:MAC  Level of Consciousness: awake and drowsy  Airway & Oxygen Therapy: Patient Spontanous Breathing and Patient connected to face mask  Post-op Assessment: Report given to RN and Post -op Vital signs reviewed and stable  Post vital signs: Reviewed and stable  Last Vitals:  Vitals Value Taken Time  BP    Temp    Pulse 56 09/18/19 1325  Resp    SpO2 100 % 09/18/19 1325  Vitals shown include unvalidated device data.  Last Pain:  Vitals:   09/18/19 1127  TempSrc: Oral  PainSc: 0-No pain         Complications: No complications documented.

## 2019-09-18 NOTE — Op Note (Signed)
[facility] Patient Name: Caleb [name] Procedure Date: 09/18/2019 MRN: [id number] Attending MD: Carol Ada , MD Date of Birth: July 24, 1950 CSN: [id number] Age: 69 Admit Type: Outpatient Procedure:                Upper GI endoscopy Indications:              Heartburn Providers:                Carol Ada, MD, Benay Pillow, RN, Elspeth Cho                            Tech., Technician, Ladona Ridgel, Technician,                            Stephanie [place] CRNA Referring MD:              Medicines:                Propofol per Anesthesia Complications:            No immediate complications. Estimated Blood Loss:     Estimated blood loss: none. Procedure:                Pre-Anesthesia Assessment:                           - Prior to the procedure, a History and Physical                            was performed, and patient medications and                            allergies were reviewed. The patient's tolerance of                            previous anesthesia was also reviewed. The risks                            and benefits of the procedure and the sedation                            options and risks were discussed with the patient.                            All questions were answered, and informed consent                            was obtained. Prior Anticoagulants: The patient has                            taken Eliquis (apixaban), last dose was 2 days                            prior to procedure. ASA Grade Assessment: III - A  patient with severe systemic disease. After                            reviewing the risks and benefits, the patient was                            deemed in satisfactory condition to undergo the                            procedure.                           - Sedation was administered by an anesthesia                            professional. Deep sedation was attained.                           After  obtaining informed consent, the endoscope was                            passed under direct vision. Throughout the                            procedure, the patient's blood pressure, pulse, and                            oxygen saturations were monitored continuously. The                            PCF-H190DL ([id number]) Olympus pediatric colonscope                            was introduced through the mouth, and advanced to                            the second part of duodenum. The upper GI endoscopy                            was accomplished without difficulty. The patient                            tolerated the procedure well. Scope In: Scope Out: Findings:      The esophagus was normal.      The stomach was normal.      The examined duodenum was normal. Impression:               - Normal esophagus.                           - Normal stomach.                           - Normal examined duodenum.                           -  No specimens collected. Moderate Sedation:      Not Applicable - Patient had care per Anesthesia. Recommendation:           - Patient has a contact number available for                            emergencies. The signs and symptoms of potential                            delayed complications were discussed with the                            patient. Return to normal activities tomorrow.                            Written discharge instructions were provided to the                            patient.                           - Resume previous diet.                           - Continue present medications. Procedure Code(s):        --- Professional ---                           [phone], Esophagogastroduodenoscopy, flexible,                            transoral; diagnostic, including collection of                            specimen(s) by brushing or washing, when performed                            (separate procedure) Diagnosis Code(s):        ---  Professional ---                           R12, Heartburn CPT copyright 2019 American Medical Association. All rights reserved. The codes documented in this report are preliminary and upon coder review may  be revised to meet current compliance requirements. Carol Ada, MD Carol Ada, MD 09/18/2019 1:40:15 PM This report has been signed electronically. Number of Addenda: 0

## 2019-09-18 NOTE — H&P (Signed)
Caleb [name] HPI: The patient is here for repeat polypectomy.  During his index colonoscopy he was noted to have multiple very large colonic polyps.  With his need for anticoagulation only two of the largest polyps were removed.  Past Medical History:  Diagnosis Date  . Acute respiratory failure with hypoxia ([place])   . Acute systolic congestive heart failure ([place]) 02/11/2014  . COPD (chronic obstructive pulmonary disease) ([place]) 02/11/2014  . Essential hypertension   . Hypercholesteremia   . 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 Tobacco abuse     Past Surgical History:  Procedure Laterality Date  . BIOPSY  10/31/2018   Procedure: BIOPSY;  Surgeon: Caleb Ada, MD;  Location: [facility];  Service: Endoscopy;;  . cataract surgery  Bilateral 12-2017 and 01-2018  . COLONOSCOPY WITH PROPOFOL N/A 10/31/2018   Procedure: COLONOSCOPY WITH PROPOFOL;  Surgeon: Caleb Ada, MD;  Location: [facility];  Service: Endoscopy;  Laterality: N/A;  . CORONARY ARTERY BYPASS GRAFT N/A 02/17/2014   Procedure: CORONARY ARTERY BYPASS GRAFTING (CABG);  Surgeon: Caleb Alberts, MD;  Location: [place];  Service: Open Heart Surgery;  Laterality: N/A;  Times 3 using bilateral mammary arteries and endoscopically harvested right saphenous vein  . ESOPHAGOGASTRODUODENOSCOPY (EGD) WITH PROPOFOL N/A 10/31/2018   Procedure: ESOPHAGOGASTRODUODENOSCOPY (EGD) WITH PROPOFOL;  Surgeon: Caleb Ada, MD;  Location: [facility];  Service: Endoscopy;  Laterality: N/A;  . FEMORAL ARTERY - FEMORAL ARTERY BYPASS GRAFT      right femoral artery to below-knee popliteal artery bypass with PTFE and right first ray amputation 04/25/17  . HEMOSTASIS CLIP PLACEMENT  10/31/2018   Procedure: HEMOSTASIS CLIP PLACEMENT;  Surgeon: Caleb Ada, MD;  Location: [facility];  Service: Endoscopy;;  . INTRAOPERATIVE TRANSESOPHAGEAL ECHOCARDIOGRAM N/A 02/17/2014   Procedure: INTRAOPERATIVE  TRANSESOPHAGEAL ECHOCARDIOGRAM;  Surgeon: Caleb Alberts, MD;  Location: [place];  Service: Open Heart Surgery;  Laterality: N/A;  . LEFT HEART CATHETERIZATION WITH CORONARY ANGIOGRAM N/A 02/12/2014   Procedure: LEFT HEART CATHETERIZATION WITH CORONARY ANGIOGRAM;  Surgeon: Caleb Grooms, MD;  [facility];  Service: Cardiovascular;  Laterality: N/A;  . POLYPECTOMY  10/31/2018   Procedure: POLYPECTOMY;  Surgeon: Caleb Ada, MD;  Location: [facility];  Service: Endoscopy;;  . SUBMUCOSAL LIFTING INJECTION  10/31/2018   Procedure: SUBMUCOSAL LIFTING INJECTION;  Surgeon: Caleb Ada, MD;  Location: [facility];  Service: Endoscopy;;  . TOE AMPUTATION Right    right great toe     Family History  Problem Relation Age of Onset  . Heart attack Mother   . Diabetes Mother   . Heart attack Brother   . Diabetes Brother   . Cancer Sister   . Stroke Neg Hx     Social History:  reports that he has been smoking cigarettes. He has a 17.50 pack-year smoking history. He has never used smokeless tobacco. He reports current alcohol use. No history on file for drug use.  Allergies:  Allergies  Allergen Reactions  . Sulfamethoxazole-Trimethoprim Other (See Comments)    "Made potassium go high and sodium go low"  . Cilostazol Swelling    Site of swelling not recalled by the patient  . Lisinopril Cough    Medications:  Scheduled:  Continuous: . lactated ringers      No results found for this or any previous visit (from the past 24 hour(s)).   No results found.  ROS:  As stated above in the HPI otherwise negative.  Blood pressure (!) 189/65, pulse 76, temperature 98 F (36.7 C), temperature source Oral, resp. rate 19, height 5\' 4"  (1.626 m), weight 61.7 kg, SpO2 100 %.    PE: Gen: NAD, Alert and Oriented HEENT:  Caleb [name]/AT, EOMI Neck: Supple, no LAD Lungs: CTA Bilaterally CV: RRR without M/G/R ABD: Soft, NTND, +BS Ext: No C/C/E  Assessment/Plan: 1) Personal history of  polyps - Colonoscopy with polypectomy.  Caleb [name] D 09/18/2019, 11:50 AM

## 2019-09-18 NOTE — Anesthesia Postprocedure Evaluation (Signed)
Anesthesia Post Note  Patient: Caleb [name]  Procedure(s) Performed: ESOPHAGOGASTRODUODENOSCOPY (EGD) WITH PROPOFOL (N/A ) COLONOSCOPY WITH PROPOFOL (N/A ) SCLEROTHERAPY HEMOSTASIS CLIP PLACEMENT     Patient location during evaluation: PACU Anesthesia Type: MAC Level of consciousness: awake and alert Pain management: pain level controlled Vital Signs Assessment: post-procedure vital signs reviewed and stable Respiratory status: spontaneous breathing, nonlabored ventilation, respiratory function stable and patient connected to nasal cannula oxygen Cardiovascular status: stable and blood pressure returned to baseline Postop Assessment: no apparent nausea or vomiting Anesthetic complications: no   No complications documented.  Last Vitals:  Vitals:   09/18/19 1127 09/18/19 1325  BP: (!) 189/65 (!) 155/38  Pulse: 76 (!) 56  Resp: 19   Temp: 36.7 C   SpO2: 100% 100%    Last Pain:  Vitals:   09/18/19 1340  TempSrc:   PainSc: 0-No pain                 Ashwath Lasch

## 2019-09-18 NOTE — Discharge Instructions (Addendum)
YOU HAD AN ENDOSCOPIC PROCEDURE TODAY: Refer to the procedure report and other information in the discharge instructions given to you for any specific questions about what was found during the examination. If this information does not answer your questions, please call [place] at [phone] to clarify. Hold xarelto for 5 days  YOU SHOULD EXPECT: Some feelings of bloating in the abdomen. Passage of more gas than usual. Walking can help get rid of the air that was put into your GI tract during the procedure and reduce the bloating. If you had a lower endoscopy (such as a colonoscopy or flexible sigmoidoscopy) you may notice spotting of blood in your stool or on the toilet paper. Some abdominal soreness may be present for a day or two, also.  DIET: Your first meal following the procedure should be a light meal and then it is ok to progress to your normal diet. A half-sandwich or bowl of soup is an example of a good first meal. Heavy or fried foods are harder to digest and may make you feel nauseous or bloated. Drink plenty of fluids but you should avoid alcoholic beverages for 24 hours. If you had an esophageal dilation, please see attached information for diet.   ACTIVITY: Your care partner should take you home directly after the procedure. You should plan to take it easy, moving slowly for the rest of the day. You can resume normal activity the day after the procedure however YOU SHOULD NOT DRIVE, use power tools, machinery or perform tasks that involve climbing or major physical exertion for 24 hours (because of the sedation medicines used during the test).   SYMPTOMS TO REPORT IMMEDIATELY: A gastroenterologist can be reached at any hour. Please call [phone]  for any of the following symptoms:  Following lower endoscopy (colonoscopy, flexible sigmoidoscopy) Excessive amounts of blood in the stool  Significant tenderness, worsening of abdominal pains  Swelling of the abdomen that is  new, acute  Fever of 100 or higher  Following upper endoscopy (EGD, EUS, ERCP, esophageal dilation) Vomiting of blood or coffee ground material  New, significant abdominal pain  New, significant chest pain or pain under the shoulder blades  Painful or persistently difficult swallowing  New shortness of breath  Black, tarry-looking or red, bloody stools  FOLLOW UP:  If any biopsies were taken you will be contacted by phone or by letter within the next 1-3 weeks. Call [phone]  if you have not heard about the biopsies in 3 weeks.  Please also call with any specific questions about appointments or follow up tests.

## 2019-09-18 NOTE — Op Note (Signed)
[facility] Patient Name: Caleb [name] Procedure Date: 09/18/2019 MRN: [id number] Attending MD: Carol Ada , MD Date of Birth: Sep 25, 1950 CSN: [id number] Age: 69 Admit Type: Outpatient Procedure:                Colonoscopy Indications:              Therapeutic procedure for colon polyps Providers:                Carol Ada, MD, Benay Pillow, RN, Elspeth Cho                            Tech., Technician, Ladona Ridgel, Technician,                            Stephanie [place] CRNA Referring MD:              Medicines:                Propofol per Anesthesia Complications:            No immediate complications. Estimated Blood Loss:     Estimated blood loss was minimal. Procedure:                Pre-Anesthesia Assessment:                           - Prior to the procedure, a History and Physical                            was performed, and patient medications and                            allergies were reviewed. The patient's tolerance of                            previous anesthesia was also reviewed. The risks                            and benefits of the procedure and the sedation                            options and risks were discussed with the patient.                            All questions were answered, and informed consent                            was obtained. Prior Anticoagulants: The patient has                            taken Eliquis (apixaban), last dose was 2 days                            prior to procedure. ASA Grade Assessment: III - A  patient with severe systemic disease. After                            reviewing the risks and benefits, the patient was                            deemed in satisfactory condition to undergo the                            procedure.                           - Sedation was administered by an anesthesia                            professional. Deep sedation was attained.                            After obtaining informed consent, the colonoscope                            was passed under direct vision. Throughout the                            procedure, the patient's blood pressure, pulse, and                            oxygen saturations were monitored continuously. The                            PCF-H190DL ([id number] Olympus pediatric colonscope                            was introduced through the anus and advanced to the                            the cecum, identified by appendiceal orifice and                            ileocecal valve. The colonoscopy was technically                            difficult and complex. The patient tolerated the                            procedure well. The quality of the bowel                            preparation was excellent. The ileocecal valve,                            appendiceal orifice, and rectum were photographed. Scope In: 79:02:40 PM Scope Out: 1:18:42 PM Scope Withdrawal Time: 1 hour 5 minutes 6 seconds  Total Procedure Duration: 1 hour 12 minutes 36 seconds  Findings:  A 40 mm polyp was found in the sigmoid colon. The polyp was       multi-lobulated and semi-pedunculated. The polyp was removed with a hot       snare. The polyp was removed with a lift and cut technique using a hot       snare. The polyp was removed with a piecemeal technique using a hot       snare. Resection and retrieval were complete. To prevent bleeding       post-intervention, five hemostatic clips were successfully placed (MR       unsafe). There was no bleeding at the end of the procedure. Area was       tattooed with an injection of 4 mL of Spot (carbon black).      A 7 mm polyp was found in the descending colon. The polyp was sessile.       The polyp was removed with a hot snare. Resection and retrieval were       complete.      Multiple sessile polyps were found in the transverse colon and ascending       colon. The polyps  were 3 to 10 mm in size. These polyps were removed       with a cold snare. Resection and retrieval were complete. For       hemostasis, two hemostatic clips were successfully placed (MR unsafe).       There was no bleeding at the end of the procedure.      Scattered small-mouthed diverticula were found in the sigmoid colon.      The large sigmoid colon polyp was identified and there was a broadbased       stalk. Three ml of epinephrine 1:10,000 was injected in to the polyp and       the base to help reduce the size of the polyp. This polyp was removed in       a piecemeal fashion as it was not able to be captured in one pass by the       jumbo snare. An excellent resection was achieved and 4 out of 5       hemoclips were successfully placed. In the ascending colon and cecum,       the prior polypectomy scars were not visible. There was no evidence of       any residual polyp. The smaller 3-4 mm sessile polyps were removed with       a cold snare. One removal resulted in persistent bleeding and two       hemoclips were used to arrest the bleeding. The proximal transverse       colon polyp was removed with a cold snare in a piecemeal fashion. Impression:               - One 40 mm polyp in the sigmoid colon, removed                            with a hot snare, removed using lift and cut and a                            hot snare and removed piecemeal using a hot snare.  Resected and retrieved. Clips (MR unsafe) were                            placed. Tattooed.                           - One 7 mm polyp in the descending colon, removed                            with a hot snare. Resected and retrieved.                           - Multiple 3 to 10 mm polyps in the transverse                            colon and in the ascending colon, removed with a                            cold snare. Resected and retrieved.                           - Diverticulosis in the sigmoid  colon. Moderate Sedation:      Not Applicable - Patient had care per Anesthesia. Recommendation:           - Patient has a contact number available for                            emergencies. The signs and symptoms of potential                            delayed complications were discussed with the                            patient. Return to normal activities tomorrow.                            Written discharge instructions were provided to the                            patient.                           - Resume previous diet.                           - Continue present medications.                           - Await pathology results.                           - Repeat colonoscopy in 6 months for surveillance.                           - Resume Eliquis in 5 days. Procedure Code(s):        ---  Professional ---                           [phone], Colonoscopy, flexible; with removal of                            tumor(s), polyp(s), or other lesion(s) by snare                            technique                           45381, Colonoscopy, flexible; with directed                            submucosal injection(s), any substance Diagnosis Code(s):        --- Professional ---                           K63.5, Polyp of colon                           K57.30, Diverticulosis of large intestine without                            perforation or abscess without bleeding CPT copyright 2019 American Medical Association. All rights reserved. The codes documented in this report are preliminary and upon coder review may  be revised to meet current compliance requirements. Carol Ada, MD Carol Ada, MD 09/18/2019 1:36:15 PM This report has been signed electronically. Number of Addenda: 0

## 2019-09-18 NOTE — Anesthesia Preprocedure Evaluation (Addendum)
Anesthesia Evaluation  Patient identified by MRN, date of birth, ID band Patient awake    Reviewed: Allergy & Precautions, NPO status , Patient's Chart, lab work & pertinent test results, reviewed documented beta blocker date and time   Airway Mallampati: I  TM Distance: >3 FB Neck ROM: Full    Dental no notable dental hx. (+) Edentulous Lower, Partial Upper, Lower Dentures, Poor Dentition, Missing,    Pulmonary COPD, Current Smoker and Patient abstained from smoking.,    Pulmonary exam normal breath sounds clear to auscultation       Cardiovascular hypertension, Pt. on home beta blockers and Pt. on medications + angina + CAD, + CABG (3v CABG 2016), + Peripheral Vascular Disease and +CHF  Normal cardiovascular exam Rhythm:Regular Rate:Normal  HLD ECHO 21   Left ventricular ejection fraction, by estimation, is 45 to 50%. The left ventricle has mildly decreased function. The left ventricle demonstrates global hypokinesis. There is no left ventricular hypertrophy. Left ventricular diastolic parameters are consistent with Grade II diastolic dysfunction (pseudonormalization). Elevated left ventricular end-diastolic pressure.   Neuro/Psych negative neurological ROS  negative psych ROS   GI/Hepatic negative GI ROS, Neg liver ROS,   Endo/Other  negative endocrine ROS  Renal/GU negative Renal ROS  negative genitourinary   Musculoskeletal negative musculoskeletal ROS (+)   Abdominal   Peds  Hematology  (+) Blood dyscrasia (on xarelto), anemia ,   Anesthesia Other Findings EGD/colonoscopy for anemia/screening  Reproductive/Obstetrics                            Anesthesia Physical  Anesthesia Plan  ASA: III  Anesthesia Plan: MAC   Post-op Pain Management:    Induction: Intravenous  PONV Risk Score and Plan: Propofol infusion and Treatment may vary due to age or medical condition  Airway  Management Planned: Natural Airway and Simple Face Mask  Additional Equipment: None  Intra-op Plan:   Post-operative Plan:   Informed Consent: I have reviewed the patients History and Physical, chart, labs and discussed the procedure including the risks, benefits and alternatives for the proposed anesthesia with the patient or authorized representative who has indicated his/her understanding and acceptance.     Dental advisory given  Plan Discussed with: CRNA and Anesthesiologist  Anesthesia Plan Comments:         Anesthesia Quick Evaluation

## 2019-09-20 ENCOUNTER — Encounter (HOSPITAL_COMMUNITY): Payer: Self-pay | Admitting: Gastroenterology

## 2019-09-22 LAB — SURGICAL PATHOLOGY

## 2019-10-26 ENCOUNTER — Ambulatory Visit (HOSPITAL_COMMUNITY)
Admission: RE | Admit: 2019-10-26 | Discharge: 2019-10-26 | Disposition: A | Discharge status: Home / Self Care | Payer: Medicare HMO | Source: Ambulatory Visit | Attending: Cardiology | Admitting: Cardiology

## 2019-10-26 ENCOUNTER — Other Ambulatory Visit: Payer: Self-pay

## 2019-10-26 DIAGNOSIS — I701 Atherosclerosis of renal artery: Secondary | ICD-10-CM | POA: Diagnosis present

## 2019-10-30 ENCOUNTER — Other Ambulatory Visit: Payer: Self-pay

## 2019-10-30 DIAGNOSIS — I701 Atherosclerosis of renal artery: Secondary | ICD-10-CM

## 2019-11-30 NOTE — Progress Notes (Signed)
Date:  12/07/2019   ID:  Caleb [name], DOB 25-May-1950, MRN [id number]  PCP:  Christain Sacramento, MD  Cardiologist:  Jenkins Rouge, MD  Electrophysiologist:  None   Evaluation Performed:  Follow-Up Visit  Chief Complaint:  none  History of Present Illness:    Caleb [name] is a 69 y.o. male with a history of CAD. He had CABG x3 in 2016. Echocardiogram in July 2020 showed normal LV function. He also has peripheral vascular disease, he had a right femoropopliteal bypass graft April 2019 at [facility]. He has known left renal artery narrowing that is described as moderate. He has treated hypertension and dyslipidemia. He is 1/2 pack a day smoker. In October 2020 he had the endoscopy which showed some gastritis with superficial bleeding.  No angina BP good at home  He followed up recently at [facility] with Dr. Jimmye Norman for his vascular disease. He is on chronic Xarelto for this.  Discussed vaccine for COVID but he does not want to get it  He is an active smoker Lung cancer screening CT negative June 2020   Seen in hospital 08/07/19 with respiratory failure and elevated BNP 1092 TTE with EF 45-50% global hypokinesis. Troponin negative had normal EF By TTE July 2020 Thought mostly having COPD exacerbation. Recs to have Outpatient myovue Amlodipine changed to ARB for reduced EF Seen by PA 08/31/19 doing well myovue not ordered   Since then has had multiple visits to [facility] for left heal breakdown Describes New bilateral stents to LE arteries to help with healing   Past Medical History:  Diagnosis Date  . Acute respiratory failure with hypoxia ([place])   . Acute systolic congestive heart failure ([place]) 02/11/2014  . COPD (chronic obstructive pulmonary disease) ([place]) 02/11/2014  . Essential hypertension   . Hypercholesteremia   . 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 Tobacco abuse    Past Surgical History:  Procedure Laterality  Date  . BIOPSY  10/31/2018   Procedure: BIOPSY;  Surgeon: Carol Ada, MD;  Location: [facility];  Service: Endoscopy;;  . cataract surgery  Bilateral 12-2017 and 01-2018  . COLONOSCOPY WITH PROPOFOL N/A 10/31/2018   Procedure: COLONOSCOPY WITH PROPOFOL;  Surgeon: Carol Ada, MD;  Location: [facility];  Service: Endoscopy;  Laterality: N/A;  . COLONOSCOPY WITH PROPOFOL N/A 09/18/2019   Procedure: COLONOSCOPY WITH PROPOFOL;  Surgeon: Carol Ada, MD;  Location: [facility];  Service: Endoscopy;  Laterality: N/A;  . CORONARY ARTERY BYPASS GRAFT N/A 02/17/2014   Procedure: CORONARY ARTERY BYPASS GRAFTING (CABG);  Surgeon: Rexene Alberts, MD;  Location: [place];  Service: Open Heart Surgery;  Laterality: N/A;  Times 3 using bilateral mammary arteries and endoscopically harvested right saphenous vein  . ESOPHAGOGASTRODUODENOSCOPY (EGD) WITH PROPOFOL N/A 10/31/2018   Procedure: ESOPHAGOGASTRODUODENOSCOPY (EGD) WITH PROPOFOL;  Surgeon: Carol Ada, MD;  [facility];  Service: Endoscopy;  Laterality: N/A;  . ESOPHAGOGASTRODUODENOSCOPY (EGD) WITH PROPOFOL N/A 09/18/2019   Procedure: ESOPHAGOGASTRODUODENOSCOPY (EGD) WITH PROPOFOL;  Surgeon: Carol Ada, MD;  [facility];  Service: Endoscopy;  Laterality: N/A;  . FEMORAL ARTERY - FEMORAL ARTERY BYPASS GRAFT      right femoral artery to below-knee popliteal artery bypass with PTFE and right first ray amputation 04/25/17  . HEMOSTASIS CLIP PLACEMENT  10/31/2018   Procedure: HEMOSTASIS CLIP PLACEMENT;  Surgeon: Carol Ada, MD;  [facility];  Service: Endoscopy;;  . HEMOSTASIS CLIP PLACEMENT  09/18/2019   Procedure: HEMOSTASIS CLIP PLACEMENT;  Surgeon: Carol Ada, MD;  [facility]: [facility];  Service: Endoscopy;;  . INTRAOPERATIVE TRANSESOPHAGEAL ECHOCARDIOGRAM N/A 02/17/2014   Procedure: INTRAOPERATIVE TRANSESOPHAGEAL ECHOCARDIOGRAM;  Surgeon: Rexene Alberts, MD;  Location: [place];  Service: Open Heart Surgery;   Laterality: N/A;  . LEFT HEART CATHETERIZATION WITH CORONARY ANGIOGRAM N/A 02/12/2014   Procedure: LEFT HEART CATHETERIZATION WITH CORONARY ANGIOGRAM;  Surgeon: Sinclair Grooms, MD;  Location[facility] C[facility] LAB;  Service: Cardiovascular;  Laterality: N/A;  . POLYPECTOMY  10/31/2018   Procedure: POLYPECTOMY;  Surgeon: Carol Ada, MD;  [facility];  Service: Endoscopy;;  . POLYPECTOMY  09/18/2019   Procedure: POLYPECTOMY;  Surgeon: Carol Ada, MD;  [facility];  Service: Endoscopy;;  . Clide Deutscher  09/18/2019   Procedure: Clide Deutscher;  Surgeon: Carol Ada, MD;  [facility];  Service: Endoscopy;;  . SUBMUCOSAL INJECTION  09/18/2019   Procedure: SUBMUCOSAL INJECTION;  Surgeon: Carol Ada, MD;  [facility];  Service: Endoscopy;;  . SUBMUCOSAL LIFTING INJECTION  10/31/2018   Procedure: SUBMUCOSAL LIFTING INJECTION;  Surgeon: Carol Ada, MD;  [facility];  Service: Endoscopy;;  . TOE AMPUTATION Right    right great toe      Current Meds  Medication Sig  . acetaminophen (TYLENOL) 500 MG tablet Take 1,000 mg by mouth 2 (two) times daily as needed for mild pain or moderate pain.   [name] albuterol (VENTOLIN HFA) 108 (90 Base) MCG/ACT inhaler Inhale 2 puffs into the lungs every 6 (six) hours as needed for wheezing or shortness of breath.  [name] atorvastatin (LIPITOR) 80 MG tablet Take 1 tablet (80 mg total) by mouth daily.  . Cholecalciferol (VITAMIN D-3 PO) Take 2 capsules by mouth daily with breakfast.   . gabapentin (NEURONTIN) 300 MG capsule Take 300 mg by mouth 3 (three) times daily.  . nitroGLYCERIN (NITROSTAT) 0.4 MG SL tablet Place 1 tablet (0.4 mg total) under the tongue every 5 (five) minutes as needed for chest pain.  [name] Olopatadine HCl (PATADAY OP) Place 1 drop into both eyes daily as needed (allergies).  . vitamin B-12 (CYANOCOBALAMIN) 1000 MCG tablet Take 1,000 mcg by mouth daily with breakfast.     Allergies:    Sulfamethoxazole-trimethoprim, Cilostazol, and Lisinopril   Social History   Tobacco Use  . Smoking status: Current Every Day Smoker    Packs/day: 0.50    Years: 35.00    Pack years: 17.50    Types: Cigarettes    Last attempt to quit: 02/11/2014    Years since quitting: 5.8  . Smokeless tobacco: Never Used  . Tobacco comment: has quit twice before   Vaping Use  . Vaping Use: Never used  Substance Use Topics  . Alcohol use: Yes    Alcohol/week: 0.0 standard drinks    Comment: Six pack a day of beer  . Drug use: Not on file     Family Hx: The patient's family history includes Cancer in his sister; Diabetes in his brother and mother; Heart attack in his brother and mother. There is no history of Stroke.  ROS:   Please see the history of present illness.    All other systems reviewed and are negative.   Prior CV studies:   The following studies were reviewed today: Echo July 2020  Labs/Other Tests and Data Reviewed:    EKG:  SR rate 70 PVC old IMI 08/25/18   Recent Labs:  08/07/2019: ALT 11; B Natriuretic Peptide 1,092.0 08/11/2019: Platelets 223; TSH 3.440 08/12/2019: Hemoglobin 9.5; Magnesium 2.1 08/13/2019: BUN 19; Creatinine, Ser 0.88; Potassium 3.7; Sodium 129   Recent Lipid Panel Lab Results  Component Value Date/Time   CHOL 176 12/27/2015 02:48 PM   TRIG 247 (H) 12/27/2015 02:48 PM   HDL 52 12/27/2015 02:48 PM   CHOLHDL 3.4 12/27/2015 02:48 PM   LDLCALC 75 12/27/2015 02:48 PM    Wt Readings from Last 3 Encounters:  12/07/19 65.3 kg  09/18/19 61.7 kg  08/31/19 62.6 kg     Objective:    Vital Signs:  BP (!) 100/46   Pulse 60   Ht 5\' 4"  (1.626 m)   Wt 65.3 kg   SpO2 94%   BMI 24.72 kg/m    Affect appropriate Healthy:  appears stated age 1: normal Neck supple with no adenopathy JVP normal  Left bruits no thyromegaly Lungs clear with no wheezing and good diaphragmatic motion Heart:  S1/S2 no murmur, no rub, gallop or click PMI normal post  sternotomy  Abdomen: benighn, BS positve, no tenderness, no AAA no bruit.  No HSM or HJR Post right fem-pop with femoral bruit no healing ulcer left heal  No edema Neuro non-focal Skin warm and dry No muscular weakness   ASSESSMENT & PLAN:    CABG x 3 2016- No recent angina but hospitalization with CHF in setting of respiratory failure and TTE with reduced EF 45-50% diffuse hypokinesis will order lexiscan myovue continue medical Rx   PVD- S/p RFP bypass  and ray procedure April 2019, he follows with dr Jimmye Norman at [facility]. Recent stenting With no healing left heal ulcer ? Need for hyperbaric oxygen   Chronic anticoagulation- On Xarelto for PVD no bleeding issues   HTN- Well controlled.  Continue current medications and low sodium Dash type diet.      HLD- On high dose statin Rx followed by PCP  Smoking:   Counseled on cessation <10 minutes lung cancer screening CT negative 07/04/18 f/u ordered   GI:  Post polypectomy 09/18/19 f/u Dr Benson Norway Hct 30.3 on 08/12/19 Had transfusion 08/17/19 EGD done at same time negative   Bruit:  Left sided don't see recent duplex will order   COVID-19 Education: The signs and symptoms of COVID-19 were discussed with the patient and how to seek care for testing (follow up with PCP or arrange E-visit).  The importance of social distancing was discussed today.   Medication Adjustments/Labs and Tests Ordered: Current medicines are reviewed at length with the patient today.  Concerns regarding medicines are outlined above.   Tests Ordered:  Lung cancer screening CT   Lexiscan myovue   Medication Changes: No orders of the defined types were placed in this encounter.   Follow Up:  With me in a year if myovue non ischemic   Signed, Jenkins Rouge, MD  12/07/2019 2:11 PM    [place]

## 2019-12-07 ENCOUNTER — Encounter: Payer: Self-pay | Admitting: Cardiovascular Disease

## 2019-12-07 ENCOUNTER — Ambulatory Visit: Payer: Medicare HMO | Admitting: Cardiovascular Disease

## 2019-12-07 ENCOUNTER — Other Ambulatory Visit: Payer: Self-pay

## 2019-12-07 VITALS — BP 100/46 | HR 60 | Ht 64.0 in | Wt 144.0 lb

## 2019-12-07 DIAGNOSIS — I25810 Atherosclerosis of coronary artery bypass graft(s) without angina pectoris: Secondary | ICD-10-CM | POA: Diagnosis not present

## 2019-12-07 DIAGNOSIS — R0989 Other specified symptoms and signs involving the circulatory and respiratory systems: Secondary | ICD-10-CM | POA: Diagnosis not present

## 2019-12-07 DIAGNOSIS — Z87891 Personal history of nicotine dependence: Secondary | ICD-10-CM | POA: Diagnosis not present

## 2019-12-07 DIAGNOSIS — I5042 Chronic combined systolic (congestive) and diastolic (congestive) heart failure: Secondary | ICD-10-CM | POA: Diagnosis not present

## 2019-12-07 NOTE — Patient Instructions (Addendum)
Medication Instructions:  *If you need a refill on your cardiac medications before your next appointment, please call your pharmacy*   Lab Work: If you have labs (blood work) drawn today and your tests are completely normal, you will receive your results only by: [name] MyChart Message (if you have MyChart) OR . A paper copy in the mail If you have any lab test that is abnormal or we need to change your treatment, we will call you to review the results.  Testing/Procedures: Your physician has requested that you have a lexiscan myoview. For further information please visit [email or web address]. Please follow instruction sheet, as given.  Non-Cardiac CT scanning for lung cancer screening, (CAT scanning), is a noninvasive, special x-ray that produces cross-sectional images of the body using x-rays and a computer. CT scans help physicians diagnose and treat medical conditions. For some CT exams, a contrast material is used to enhance visibility in the area of the body being studied. CT scans provide greater clarity and reveal more details than regular x-ray exams.  Your physician has requested that you have a carotid duplex. This test is an ultrasound of the carotid arteries in your neck. It looks at blood flow through these arteries that supply the brain with blood. Allow one hour for this exam. There are no restrictions or special instructions.  Follow-Up: At [facility], you and your health needs are our priority.  As part of our continuing mission to provide you with exceptional heart care, we have created designated Provider Care Teams.  These Care Teams include your primary Cardiologist (physician) and Advanced Practice Providers (APPs -  Physician Assistants and Nurse Practitioners) who all work together to provide you with the care you need, when you need it.  We recommend signing up for the patient portal called "MyChart".  Sign up information is provided on this After Visit Summary.  MyChart  is used to connect with patients for Virtual Visits (Telemedicine).  Patients are able to view lab/test results, encounter notes, upcoming appointments, etc.  Non-urgent messages can be sent to your provider as well.   To learn more about what you can do with MyChart, go to [email or web address].    Your next appointment:   12 month(s)  The format for your next appointment:   In Person  Provider:   You may see Jenkins Rouge, MD or one of the following Advanced Practice Providers on your designated Care Team:    Truitt Merle, NP  Cecilie Kicks, NP  Kathyrn Drown, NP

## 2019-12-17 ENCOUNTER — Other Ambulatory Visit: Payer: Self-pay

## 2019-12-17 DIAGNOSIS — I5042 Chronic combined systolic (congestive) and diastolic (congestive) heart failure: Secondary | ICD-10-CM

## 2019-12-17 DIAGNOSIS — I25810 Atherosclerosis of coronary artery bypass graft(s) without angina pectoris: Secondary | ICD-10-CM

## 2019-12-17 NOTE — Progress Notes (Signed)
Will forward to Dr. Nishan to sign. 

## 2019-12-23 ENCOUNTER — Other Ambulatory Visit: Payer: Self-pay

## 2019-12-23 ENCOUNTER — Ambulatory Visit (HOSPITAL_COMMUNITY)
Admission: RE | Admit: 2019-12-23 | Discharge: 2019-12-23 | Disposition: A | Discharge status: Home / Self Care | Payer: Medicare HMO | Source: Ambulatory Visit | Attending: Cardiovascular Disease | Admitting: Cardiovascular Disease

## 2019-12-23 ENCOUNTER — Telehealth (HOSPITAL_COMMUNITY): Payer: Self-pay | Admitting: *Deleted

## 2019-12-23 ENCOUNTER — Other Ambulatory Visit (HOSPITAL_COMMUNITY): Payer: Self-pay | Admitting: Cardiovascular Disease

## 2019-12-23 DIAGNOSIS — I25810 Atherosclerosis of coronary artery bypass graft(s) without angina pectoris: Secondary | ICD-10-CM | POA: Diagnosis not present

## 2019-12-23 DIAGNOSIS — I6523 Occlusion and stenosis of bilateral carotid arteries: Secondary | ICD-10-CM

## 2019-12-23 DIAGNOSIS — R0989 Other specified symptoms and signs involving the circulatory and respiratory systems: Secondary | ICD-10-CM | POA: Diagnosis not present

## 2019-12-23 DIAGNOSIS — I5042 Chronic combined systolic (congestive) and diastolic (congestive) heart failure: Secondary | ICD-10-CM | POA: Diagnosis present

## 2019-12-23 NOTE — Telephone Encounter (Signed)
Patient's daughter given detailed instructions per Myocardial Perfusion Study Information Sheet for the test on 12/28/19 at 10:30. Patient notified to arrive 15 minutes early and that it is imperative to arrive on time for appointment to keep from having the test rescheduled.  If you need to cancel or reschedule your appointment, please call the office within 24 hours of your appointment. . Patient's verbalized understanding.Caleb [name]

## 2019-12-28 ENCOUNTER — Other Ambulatory Visit: Payer: Self-pay

## 2019-12-28 ENCOUNTER — Ambulatory Visit (INDEPENDENT_AMBULATORY_CARE_PROVIDER_SITE_OTHER)
Admission: RE | Admit: 2019-12-28 | Discharge: 2019-12-28 | Disposition: A | Discharge status: Home / Self Care | Payer: Medicare HMO | Source: Ambulatory Visit | Attending: Cardiovascular Disease | Admitting: Cardiovascular Disease

## 2019-12-28 ENCOUNTER — Ambulatory Visit (HOSPITAL_COMMUNITY): Payer: Medicare HMO | Attending: Internal Medicine

## 2019-12-28 DIAGNOSIS — Z87891 Personal history of nicotine dependence: Secondary | ICD-10-CM | POA: Diagnosis not present

## 2019-12-28 DIAGNOSIS — I5042 Chronic combined systolic (congestive) and diastolic (congestive) heart failure: Secondary | ICD-10-CM | POA: Insufficient documentation

## 2019-12-28 DIAGNOSIS — I25810 Atherosclerosis of coronary artery bypass graft(s) without angina pectoris: Secondary | ICD-10-CM | POA: Diagnosis present

## 2019-12-28 LAB — MYOCARDIAL PERFUSION IMAGING
LV dias vol: 81 mL (ref 62–150)
LV sys vol: 32 mL
Peak HR: 70 {beats}/min
Rest HR: 63 {beats}/min
SDS: 0
SRS: 4
SSS: 4
TID: 1.05

## 2019-12-28 MED ORDER — TECHNETIUM TC 99M TETROFOSMIN IV KIT
31.7000 | PACK | Freq: Once | INTRAVENOUS | Status: AC | PRN
  Administered 2019-12-28: 31.7 via INTRAVENOUS
  Filled 2019-12-28: qty 32

## 2019-12-28 MED ORDER — TECHNETIUM TC 99M TETROFOSMIN IV KIT
10.3000 | PACK | Freq: Once | INTRAVENOUS | Status: AC | PRN
  Administered 2019-12-28: 10.3 via INTRAVENOUS
  Filled 2019-12-28: qty 11

## 2019-12-28 MED ORDER — REGADENOSON 0.4 MG/5ML IV SOLN
0.4000 mg | Freq: Once | INTRAVENOUS | Status: AC
  Administered 2019-12-28: 0.4 mg via INTRAVENOUS

## 2019-12-29 ENCOUNTER — Telehealth: Payer: Self-pay | Admitting: Cardiovascular Disease

## 2019-12-29 NOTE — Telephone Encounter (Signed)
Pt's daughter is returning Pamela's call in regards to Amar's results. Please advise.

## 2019-12-29 NOTE — Telephone Encounter (Signed)
The patient's daughter [facility]) has been notified of the result and verbalized understanding.  All questions (if any) were answered. Michaelyn Barter, RN 12/29/2019 10:19 AM

## 2020-01-20 NOTE — Progress Notes (Signed)
Date:  01/22/2020   ID:  Caleb [name], DOB [birth year], MRN [id number]  PCP:  Percell Belt, DO  Cardiologist:  Jenkins Rouge, MD  Electrophysiologist:  None   Evaluation Performed:  Follow-Up Visit  Chief Complaint:  none  History of Present Illness:    70 y.o. with history of CAD/CABG in 2016. Last myovue done 12/28/19 was normal and nonischemic with EF 60%. Significant PVD followed at [facility]  With right fem-pop and bilateral stents for non healing left heel ulcer He takes low dose xarelto for his PVD  He has Rx HTN, HLD and continues to smoke Lung cancer screening CT negative 12/28/19   Discussed vaccine for COVID but he does not want to get it  He still has ulcer on left foot seeing wound center Has had hyponatremia and primary stopped his Aldactone, coreg and Irbesartan   Past Medical History:  Diagnosis Date  . Acute respiratory failure with hypoxia ([place])   . Acute systolic congestive heart failure ([place]) 02/11/2014  . COPD (chronic obstructive pulmonary disease) ([place]) 02/11/2014  . Essential hypertension   . Hypercholesteremia   . 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 Tobacco abuse    Past Surgical History:  Procedure Laterality Date  . BIOPSY  10/31/2018   Procedure: BIOPSY;  Surgeon: Carol Ada, MD;  Location: [facility];  Service: Endoscopy;;  . cataract surgery  Bilateral 12-2017 and 01-2018  . COLONOSCOPY WITH PROPOFOL N/A 10/31/2018   Procedure: COLONOSCOPY WITH PROPOFOL;  Surgeon: Carol Ada, MD;  Location: [facility];  Service: Endoscopy;  Laterality: N/A;  . COLONOSCOPY WITH PROPOFOL N/A 09/18/2019   Procedure: COLONOSCOPY WITH PROPOFOL;  Surgeon: Carol Ada, MD;  Location: [facility];  Service: Endoscopy;  Laterality: N/A;  . CORONARY ARTERY BYPASS GRAFT N/A 02/17/2014   Procedure: CORONARY ARTERY BYPASS GRAFTING (CABG);  Surgeon: Rexene Alberts, MD;  Location: [place];  Service: Open Heart Surgery;   Laterality: N/A;  Times 3 using bilateral mammary arteries and endoscopically harvested right saphenous vein  . ESOPHAGOGASTRODUODENOSCOPY (EGD) WITH PROPOFOL N/A 10/31/2018   Procedure: ESOPHAGOGASTRODUODENOSCOPY (EGD) WITH PROPOFOL;  Surgeon: Carol Ada, MD;  Location: [facility];  Service: Endoscopy;  Laterality: N/A;  . ESOPHAGOGASTRODUODENOSCOPY (EGD) WITH PROPOFOL N/A 09/18/2019   Procedure: ESOPHAGOGASTRODUODENOSCOPY (EGD) WITH PROPOFOL;  Surgeon: Carol Ada, MD;  Location: [facility];  Service: Endoscopy;  Laterality: N/A;  . FEMORAL ARTERY - FEMORAL ARTERY BYPASS GRAFT      right femoral artery to below-knee popliteal artery bypass with PTFE and right first ray amputation 04/25/17  . HEMOSTASIS CLIP PLACEMENT  10/31/2018   Procedure: HEMOSTASIS CLIP PLACEMENT;  Surgeon: Carol Ada, MD;  Location: [facility];  Service: Endoscopy;;  . HEMOSTASIS CLIP PLACEMENT  09/18/2019   Procedure: HEMOSTASIS CLIP PLACEMENT;  Surgeon: Carol Ada, MD;  Location: [facility];  Service: Endoscopy;;  . INTRAOPERATIVE TRANSESOPHAGEAL ECHOCARDIOGRAM N/A 02/17/2014   Procedure: INTRAOPERATIVE TRANSESOPHAGEAL ECHOCARDIOGRAM;  Surgeon: Rexene Alberts, MD;  Location: [place];  Service: Open Heart Surgery;  Laterality: N/A;  . LEFT HEART CATHETERIZATION WITH CORONARY ANGIOGRAM N/A 02/12/2014   Procedure: LEFT HEART CATHETERIZATION WITH CORONARY ANGIOGRAM;  Surgeon: Sinclair Grooms, MD;  Location: [facility] CATH LAB;  Service: Cardiovascular;  Laterality: N/A;  . POLYPECTOMY  10/31/2018   Procedure: POLYPECTOMY;  Surgeon: Carol Ada, MD;  Location: [facility];  Service: Endoscopy;;  . POLYPECTOMY  09/18/2019   Procedure:  POLYPECTOMY;  Surgeon: Carol Ada, MD;  Location: [facility];  Service: Endoscopy;;  . Clide Deutscher  09/18/2019   Procedure: Clide Deutscher;  Surgeon: Carol Ada, MD;  Location: [facility];  Service: Endoscopy;;  . SUBMUCOSAL INJECTION  09/18/2019   Procedure: SUBMUCOSAL INJECTION;   Surgeon: Carol Ada, MD;  Location: [facility];  Service: Endoscopy;;  . SUBMUCOSAL LIFTING INJECTION  10/31/2018   Procedure: SUBMUCOSAL LIFTING INJECTION;  Surgeon: Carol Ada, MD;  Location: [facility];  Service: Endoscopy;;  . TOE AMPUTATION Right    right great toe      Current Meds  Medication Sig  . acetaminophen (TYLENOL) 500 MG tablet Take 1,000 mg by mouth 2 (two) times daily as needed for mild pain or moderate pain.   [name] albuterol (VENTOLIN HFA) 108 (90 Base) MCG/ACT inhaler Inhale 2 puffs into the lungs every 6 (six) hours as needed for wheezing or shortness of breath.  [name] atorvastatin (LIPITOR) 80 MG tablet Take 1 tablet (80 mg total) by mouth daily.  . Cholecalciferol (VITAMIN D-3 PO) Take 2 capsules by mouth daily with breakfast.   . gabapentin (NEURONTIN) 300 MG capsule Take 300 mg by mouth 3 (three) times daily.  . nitroGLYCERIN (NITROSTAT) 0.4 MG SL tablet Place 1 tablet (0.4 mg total) under the tongue every 5 (five) minutes as needed for chest pain.  [name] Olopatadine HCl (PATADAY OP) Place 1 drop into both eyes daily as needed (allergies).  . vitamin B-12 (CYANOCOBALAMIN) 1000 MCG tablet Take 1,000 mcg by mouth daily with breakfast.     Allergies:   Sulfamethoxazole-trimethoprim, Cilostazol, Lisinopril, and Losartan   Social History   Tobacco Use  . Smoking status: Current Every Day Smoker    Packs/day: 0.50    Years: 35.00    Pack years: 17.50    Types: Cigarettes    Last attempt to quit: 02/11/2014    Years since quitting: 5.9  . Smokeless tobacco: Never Used  . Tobacco comment: has quit twice before   Vaping Use  . Vaping Use: Never used  Substance Use Topics  . Alcohol use: Yes    Alcohol/week: 0.0 standard drinks    Comment: Six pack a day of beer     Family Hx: The patient's family history includes Cancer in his sister; Diabetes in his brother and mother; Heart attack in his brother and mother. There is no history of Stroke.  ROS:   Please  see the history of present illness.    All other systems reviewed and are negative.   Prior CV studies:   The following studies were reviewed today: Echo July 2020  Labs/Other Tests and Data Reviewed:    EKG:  SR rate 87 PVC old IMI 08/25/18   Recent Labs: 08/07/2019: ALT 11; B Natriuretic Peptide 1,092.0 08/11/2019: Platelets 223; TSH 3.440 08/12/2019: Hemoglobin 9.5; Magnesium 2.1 08/13/2019: BUN 19; Creatinine, Ser 0.88; Potassium 3.7; Sodium 129   Recent Lipid Panel Lab Results  Component Value Date/Time   CHOL 176 12/27/2015 02:48 PM   TRIG 247 (H) 12/27/2015 02:48 PM   HDL 52 12/27/2015 02:48 PM   CHOLHDL 3.4 12/27/2015 02:48 PM   LDLCALC 75 12/27/2015 02:48 PM    Wt Readings from Last 3 Encounters:  01/22/20 64.9 kg  12/28/19 65.3 kg  12/07/19 65.3 kg     Objective:    Vital Signs:  BP (!) 172/93   Pulse 100   Ht 5\' 4"  (1.626 m)   Wt 64.9 kg   SpO2 99%  BMI 24.55 kg/m    No distress No tachypnea No JVP elevation    ASSESSMENT & PLAN:    CABG x 3 2016- No recent angina non ischemic myovue 12/28/19 continue medical Rx  PVD- S/p RFP bypass  And stents followed by Dr Benancio Deeds Still with ulcer on left Foot Discussed need for coordinated care by [facility] and wound center to avoid Amputation ? Hyperbaric oxygen   Chronic anticoagulation- On Xarelto for PVD no bleeding issues   HTN- Well controlled.  Continue current medications and low sodium Dash type diet.      HLD- On high dose statin Rx followed by PCP  Smoking:   Counseled on cessation <10 minutes lung cancer screening CT negative 07/04/18 f/u ordered   GI:  Post polypectomy 09/18/19 f/u Dr Elnoria Howard Hct 30.3 on 08/12/19 Had transfusion 08/17/19 EGD done at same time negative   Bruit:  Left sided [place] 12/23/19 plaque no stenosis   Smoking:  Counseled on cessation < 10 minutes CT chest negative for cancer should have f/u 12/2020  Metabolic:  ? Recent low Na ok to continue to hold aldactone but  restart ARB and coreg so BP not high Has f/u labs with primary next week   COVID-19 Education: The signs and symptoms of COVID-19 were discussed with the patient and how to seek care for testing (follow up with PCP or arrange E-visit).  The importance of social distancing was discussed today.   Medication Adjustments/Labs and Tests Ordered: Current medicines are reviewed at length with the patient today.  Concerns regarding medicines are outlined above.   Tests Ordered:  None  Medication Changes: No orders of the defined types were placed in this encounter.   Follow Up:  With me in a year   Time spent reviewed PV records Dr Mayford Knife, [place] , echo carotid CT scan direct patient interview and composing note 30 minutes   Signed, Charlton Haws, MD  01/22/2020 9:10 AM    [place] [facility]

## 2020-01-22 ENCOUNTER — Telehealth (INDEPENDENT_AMBULATORY_CARE_PROVIDER_SITE_OTHER): Payer: Medicare HMO | Admitting: Cardiovascular Disease

## 2020-01-22 ENCOUNTER — Other Ambulatory Visit: Payer: Self-pay

## 2020-01-22 VITALS — BP 172/93 | HR 100 | Ht 64.0 in | Wt 143.0 lb

## 2020-01-22 DIAGNOSIS — Z951 Presence of aortocoronary bypass graft: Secondary | ICD-10-CM | POA: Diagnosis not present

## 2020-01-22 DIAGNOSIS — I739 Peripheral vascular disease, unspecified: Secondary | ICD-10-CM | POA: Diagnosis not present

## 2020-01-22 DIAGNOSIS — I10 Essential (primary) hypertension: Secondary | ICD-10-CM

## 2020-01-22 NOTE — Patient Instructions (Addendum)

## 2020-03-07 ENCOUNTER — Ambulatory Visit: Payer: Medicare HMO | Admitting: Neurology

## 2020-03-07 ENCOUNTER — Encounter: Payer: Self-pay | Admitting: Neurology

## 2020-03-07 VITALS — BP 122/70 | HR 65

## 2020-03-07 DIAGNOSIS — M47892 Other spondylosis, cervical region: Secondary | ICD-10-CM

## 2020-03-07 DIAGNOSIS — Z789 Other specified health status: Secondary | ICD-10-CM | POA: Diagnosis not present

## 2020-03-07 DIAGNOSIS — I739 Peripheral vascular disease, unspecified: Secondary | ICD-10-CM | POA: Diagnosis not present

## 2020-03-07 DIAGNOSIS — M62529 Muscle wasting and atrophy, not elsewhere classified, unspecified upper arm: Secondary | ICD-10-CM

## 2020-03-07 DIAGNOSIS — G629 Polyneuropathy, unspecified: Secondary | ICD-10-CM

## 2020-03-07 DIAGNOSIS — F17200 Nicotine dependence, unspecified, uncomplicated: Secondary | ICD-10-CM | POA: Diagnosis not present

## 2020-03-07 DIAGNOSIS — M47812 Spondylosis without myelopathy or radiculopathy, cervical region: Secondary | ICD-10-CM

## 2020-03-07 DIAGNOSIS — R600 Localized edema: Secondary | ICD-10-CM

## 2020-03-07 DIAGNOSIS — D472 Monoclonal gammopathy: Secondary | ICD-10-CM

## 2020-03-07 DIAGNOSIS — M4802 Spinal stenosis, cervical region: Secondary | ICD-10-CM

## 2020-03-07 NOTE — Patient Instructions (Addendum)
It was nice to meet you and [name] today.  You may have a condition called peripheral neuropathy, i. e. nerve damage. Unfortunately, as I mentioned, there is no specific treatment for most neuropathies. The most common cause for neuropathy is diabetes in this [place], in which case, tight glucose control is key.  Some studies suggest that obesity and prediabetes can also cause nerve damage even in the absence of a formal diagnosis of diabetes.    You have muscle wasting in the hands but also overall thenar muscle bulk.  I think we need to look at other causes of your symptoms and probably peripheral vascular disease and congestive heart failure with significant swelling of the legs are contributors as well.  Please continue to work on lifestyle changes including cessation of smoking completely and reduction of your daily alcohol consumption and eventual complete cessation of alcohol as well.  Other causes include thyroid disease, and some vitamin deficiencies. Certain medications such as chemotherapy agents and other chemicals or toxins including alcohol can cause neuropathy. There are some genetic conditions or hereditary neuropathies. Typically patients will report a family history of neuropathy in those conditions. There are cases associated with cancers and autoimmune conditions. Most neuropathies are progressive unless a root cause can be found and treated, which is rare, as I explained. For most neuropathies there is no actual cure or reversing of symptoms. Painful neuropathy can be difficult to treat symptomatically, but there are some medications available to ease the symptoms.  Thankfully, you have no significant pain symptoms at this time.     Electrophysiologic testing with nerve conduction velocity studies and EMG (muscle testing) do not always pick up neuropathies that affect the smallest fibers. Other common tests include different type of blood work, and rarely, spinal fluid testing, and  sometimes we resort to asking for a nerve and muscle biopsy.  We will do testing of your upper extremities only since you have a wound on the left leg and you have severe swelling in the right leg. We can also consider specialist input from a neuromuscular specialist, sometimes we make referrals to [facility] or [facility] or [facility].  For now, as discussed, we will proceed with further work-up from my end of things: We will check blood work today and call you with the test results.  We will do an EMG and nerve conduction velocity test, which is an electrical nerve and muscle test, which we will schedule. We will call you with the results.  We will do a cervical spine (i.e. neck) MRI to look for degenerative changes.

## 2020-03-07 NOTE — Progress Notes (Signed)
Subjective:    Patient ID: Caleb [name] is a 70 y.o. male.  HPI     Star Age, MD, PhD [facility] [address], [address] [place], [place] [address]  Dear Dr. Rayann Heman,   I saw your patient, Caleb [name], upon your kind request, in my Neurologic clinic today for initial consultation of his neuropathy, concern for peripheral neuropathy, particularly right hand weakness. The patient is accompanied by his daughter, Caleb [name], today. 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  This has been stable.  In the past months he has noticed numbness affecting both hands and weakness in both hands.  He has been noted to have muscle wasting.  He has not fallen recently.  He was hospitalized in December last year for congestive heart failure.  He has developed a wound on the left foot and has been followed by wound care, currently has a boot on his left leg.  He has significant swelling in the right leg.  He has not been walking, mostly stays in his wheelchair and transfers with a 2 wheeled walker. I reviewed your office note from 12/24/2019. Of note, he has been on gabapentin, currently 300 mg 3 times daily. He had recent blood work through your office on 03/02/2020, sodium was 132, potassium 4.6, glucose 100, BUN 13, creatinine 0.78, ALT 8, AST 14, RBC 4.21, WBC 5.1, hemoglobin 14, hematocrit 41.1, MCV 97.7 hepatitis C nonreactive.  In addition, his daughter called additional blood  test results from 03/02/2020 on her phone: Total cholesterol 146, triglycerides 101, HDL 57, LDL 75.  His vitamin D was greater than 120, PSA 0.77, A1c 5.1. He lives alone, he is widowed, he has 2 daughters, and he lives next door and his other daughter is currently residing in [place].  He smokes about 12 cigarettes/day.  He has been a smoker for years.  He is followed by cardiology and his next appointment is in April 2022.  He drinks alcohol daily, he reports drinking a sixpack's each evening, it helps him relax and sleep.  He has seen a chiropractor and had an x-ray done of the neck.  He was told that he may have had an injury to the neck in the past.  He has not seen a spine specialist.  He has never had an MRI of the neck.  He denies any significant neck pain but his daughter reports that he continues to move his head and he feels that something needs to pop in his neck.  He denies any radicular type pain, no radiating tingling from the neck to the arms or hands.  His Past Medical History Is Significant For: Past Medical History:  Diagnosis Date  . Acute respiratory failure with hypoxia ([place])   . Acute systolic congestive heart failure ([place]) 02/11/2014  . COPD (chronic obstructive pulmonary disease) ([place]) 02/11/2014  . Essential hypertension   . Hypercholesteremia   . 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  Tobacco abuse     His Past Surgical History Is Significant For: Past Surgical History:  Procedure Laterality Date  . BIOPSY  10/31/2018   Procedure: BIOPSY;  Surgeon: Carol Ada, MD;  Location: [facility];  Service: Endoscopy;;  . cataract surgery  Bilateral 12-2017 and 01-2018  . COLONOSCOPY WITH PROPOFOL N/A 10/31/2018   Procedure: COLONOSCOPY WITH PROPOFOL;  Surgeon: Carol Ada, MD;  Location: [facility];  Service: Endoscopy;  Laterality: N/A;  . COLONOSCOPY WITH PROPOFOL N/A 09/18/2019   Procedure: COLONOSCOPY WITH PROPOFOL;   Surgeon: Carol Ada, MD;  Location: [facility];  Service: Endoscopy;  Laterality: N/A;  . CORONARY ARTERY BYPASS GRAFT N/A 02/17/2014   Procedure: CORONARY ARTERY BYPASS GRAFTING (CABG);  Surgeon: Rexene Alberts, MD;  Location: [facility];  Service: Open Heart Surgery;  Laterality: N/A;  Times 3 using bilateral mammary arteries and endoscopically harvested right saphenous vein  . ESOPHAGOGASTRODUODENOSCOPY (EGD) WITH PROPOFOL N/A 10/31/2018   Procedure: ESOPHAGOGASTRODUODENOSCOPY (EGD) WITH PROPOFOL;  Surgeon: Carol Ada, MD;  [facility];  Service: Endoscopy;  Laterality: N/A;  . ESOPHAGOGASTRODUODENOSCOPY (EGD) WITH PROPOFOL N/A 09/18/2019   Procedure: ESOPHAGOGASTRODUODENOSCOPY (EGD) WITH PROPOFOL;  Surgeon: Carol Ada, MD;  [facility]: [facility];  Service: Endoscopy;  Laterality: N/A;  . FEMORAL ARTERY - FEMORAL ARTERY BYPASS GRAFT      right femoral artery to below-knee popliteal artery bypass with PTFE and right first ray amputation 04/25/17  . HEMOSTASIS CLIP PLACEMENT  10/31/2018   Procedure: HEMOSTASIS CLIP PLACEMENT;  Surgeon: Carol Ada, MD;  [facility]: [facility];  Service: Endoscopy;;  . HEMOSTASIS CLIP PLACEMENT  09/18/2019   Procedure: HEMOSTASIS CLIP PLACEMENT;  Surgeon: Carol Ada, MD;  Location: [facility];  Service: Endoscopy;;  . INTRAOPERATIVE TRANSESOPHAGEAL ECHOCARDIOGRAM N/A 02/17/2014   Procedure: INTRAOPERATIVE TRANSESOPHAGEAL ECHOCARDIOGRAM;  Surgeon: Rexene Alberts, MD;  Location: [facility];  Service: Open Heart Surgery;  Laterality: N/A;  . LEFT HEART CATHETERIZATION WITH CORONARY ANGIOGRAM N/A 02/12/2014   Procedure: LEFT HEART CATHETERIZATION WITH CORONARY ANGIOGRAM;  Surgeon: Sinclair Grooms, MD;  Location: [facility];  Service: Cardiovascular;  Laterality: N/A;  . POLYPECTOMY  10/31/2018   Procedure: POLYPECTOMY;  Surgeon: Carol Ada, MD;  [facility]: [facility];  Service: Endoscopy;;  . POLYPECTOMY  09/18/2019   Procedure: POLYPECTOMY;   Surgeon: Carol Ada, MD;  Location: [facility];  Service: Endoscopy;;  . Clide Deutscher  09/18/2019   Procedure: Clide Deutscher;  Surgeon: Carol Ada, MD;  Location: [facility];  Service: Endoscopy;;  . SUBMUCOSAL INJECTION  09/18/2019   Procedure: SUBMUCOSAL INJECTION;  Surgeon: Carol Ada, MD;  [facility];  Service: Endoscopy;;  . SUBMUCOSAL LIFTING INJECTION  10/31/2018   Procedure: SUBMUCOSAL LIFTING INJECTION;  Surgeon: Carol Ada, MD;  Location: [facility];  Service: Endoscopy;;  . TOE AMPUTATION Right    right great toe     His Family History Is Significant For: Family History  Problem Relation Age of Onset  . Heart attack Mother   . Diabetes Mother   . Heart attack Brother   . Diabetes Brother   . Cancer Sister   . Stroke Neg Hx     His Social History Is Significant For: Social History   Socioeconomic History  . Marital status: Single    Spouse name: Not on file  . Number of children: Not on file  . Years of education: Not on file  . Highest education level: Not on file  Occupational History  . Occupation: metal work  Tobacco Use  .  Smoking status: Current Every Day Smoker    Packs/day: 0.50    Years: 35.00    Pack years: 17.50    Types: Cigarettes    Last attempt to quit: 02/11/2014    Years since quitting: 6.0  . Smokeless tobacco: Never Used  . Tobacco comment: has quit twice before   Vaping Use  . Vaping Use: Never used  Substance and Sexual Activity  . Alcohol use: Yes    Alcohol/week: 0.0 standard drinks    Comment: Six pack a day of beer  . Drug use: Never  . Sexual activity: Not on file  Other Topics Concern  . Not on file  Social History Narrative  . Not on file   Social Determinants of Health   Financial Resource Strain: Not on file  Food Insecurity: Not on file  Transportation Needs: Not on file  Physical Activity: Not on file  Stress: Not on file  Social Connections: Not on file    His Allergies Are:   Allergies  Allergen Reactions  . Sulfamethoxazole-Trimethoprim Other (See Comments)    "Made potassium go high and sodium go low"  . Cilostazol Swelling    Site of swelling not recalled by the patient  . Lisinopril Cough  . Losartan Swelling    Pt's daughter stated it made pt's blood pressure and sodium too low, but pt has been tolerating irbesartan.  :   His Current Medications Are:  Outpatient Encounter Medications as of 03/07/2020  Medication Sig  . acetaminophen (TYLENOL) 500 MG tablet Take 1,000 mg by mouth 2 (two) times daily as needed for mild pain or moderate pain.   [name] albuterol (VENTOLIN HFA) 108 (90 Base) MCG/ACT inhaler Inhale 2 puffs into the lungs every 6 (six) hours as needed for wheezing or shortness of breath.  [name] atorvastatin (LIPITOR) 80 MG tablet Take 1 tablet (80 mg total) by mouth daily.  . Cholecalciferol (VITAMIN D-3 PO) Take 2 capsules by mouth daily with breakfast.   . gabapentin (NEURONTIN) 300 MG capsule Take 300 mg by mouth 3 (three) times daily.  . nitroGLYCERIN (NITROSTAT) 0.4 MG SL tablet Place 1 tablet (0.4 mg total) under the tongue every 5 (five) minutes as needed for chest pain.  . NON FORMULARY Salt Tablets  . Olopatadine HCl (PATADAY OP) Place 1 drop into both eyes daily as needed (allergies).  . THIAMINE HCL PO Take by mouth.  . vitamin B-12 (CYANOCOBALAMIN) 1000 MCG tablet Take 1,000 mcg by mouth daily with breakfast.  . amLODipine (NORVASC) 5 MG tablet Take 1 tablet (5 mg total) by mouth daily.  . ferrous sulfate 325 (65 FE) MG tablet Take 1 tablet (325 mg total) by mouth daily with breakfast.  . furosemide (LASIX) 20 MG tablet Take 1 tablet (20 mg total) by mouth daily.  . irbesartan (AVAPRO) 300 MG tablet Take 1 tablet (300 mg total) by mouth daily.  . pantoprazole (PROTONIX) 40 MG tablet Take 1 tablet (40 mg total) by mouth daily.  . rivaroxaban (XARELTO) 2.5 MG TABS tablet Take 1 tablet (2.5 mg total) by mouth 2 (two) times daily.   No  facility-administered encounter medications on file as of 03/07/2020.  :   Review of Systems:  Out of a complete 14 point review of systems, all are reviewed and negative with the exception of these symptoms as listed below: Review of Systems  Neurological:       Here for consult on worsening neuropathy in feet and decreased hand strength. Pt  reports neuropathy has been present for a while but has worsened lately . Pt reports the weakness in his hand started about. Pt does have a left boot in place due to blood blisters that are not healing well.      Objective:  Neurological Exam  Physical Exam Physical Examination:   Vitals:   03/07/20 0745  BP: 122/70  Pulse: 65  SpO2: 97%    General Examination: The patient is a very pleasant 70 y.o. male in no acute distress. He appears chronically ill and deconditioned.  Adequately groomed.  HEENT: Normocephalic, atraumatic, pupils are equal, round and reactive to light and accommodation.  Corrective eyeglasses in place, hearing grossly intact.  Face is symmetric with normal facial animation and normal facial sensation to light touch, temperature and vibration.  Neck is supple, good range of motion actively.  No carotid bruits.  Airway examination reveals no significant mouth dryness, tongue protrudes centrally and palate elevates symmetrically, nearly edentulous state.   Chest: Clear to auscultation without wheezing, rhonchi or crackles noted.  Heart: S1+S2+0, regular and normal without murmurs, rubs or gallops noted, occasional extra beats noted.   Abdomen: Soft, non-tender and non-distended with normal bowel sounds appreciated on auscultation.  Extremities: There is 3+ pitting edema in the distal right lower extremity, left leg is in a boot.  Pedal pulses on the right leg are not palpable due to swelling.  Status post right big toe amputation.   Skin: Warm and dry without trophic changes noted.   Musculoskeletal: exam reveals arthritic  changes in both hands.    Neurologically:  Mental status: The patient is awake, alert and oriented in all 4 spheres. His immediate and remote memory, attention, language skills and fund of knowledge are fairly appropriate but he is not giving a very detailed history, some details are provided by his daughter.  He is for example not completely clear as to how long he has had symptoms in the lower extremities of numbness and discomfort.  He is not sure how long he has been on gabapentin. Mood is somewhat constricted in affect appears to be mildly flat.  Cranial nerves II - XII are as described above under HEENT exam.   Motor exam: Globally, he appears to have a thin muscle bulk.  He has significant swelling in the right distal lower extremity.  He has wasting in the thenar and hypothenar eminences in both hands.  He has rare appearing fasciculations noted in the right forearm.  He has global strength of 4+ out of 5.  He has normal tone.  He has no resting or postural or action tremor.  Reflexes are 1+ in the upper extremities and absent in the lower extremities.   On cerebellar testing, no dysmetria noted in the upper extremities, heel-to-shin not possible with the lower extremities. Sensory exam: intact to light touch, but decreased to pinprick sensation in the distal upper extremities up to mid forearm bilaterally, in the lower extremities he has decreased sensation to pinprick and temperature and vibration, right lower extremity only tested, up to below knee area.  Gait, station and balance: He brought a cane but does not actually walk with a cane at home.  He is in a wheelchair.  For safety reasons I did not have him walk or stand for me as he did not have a walker with him.   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  He has a longstanding history of neuropathy type symptoms affecting his lower extremities.  Confounding and complicating factors are peripheral peripheral vascular disease, and significant lower extremity edema, poor wound healing in the left foot, left lower extremity was not possible to be examined today.  Limited exam was possible in the right lower extremity which was quite swollen.  He has global weakness in the mild range, atrophy noted in the distal hand muscles bilaterally.  He also has had occasional fasciculations in the forearm area particularly on the right side.   Peripheral neuropathy versus radiculopathy are certainly in the differential diagnosis, versus myopathy, he does not have any telltale upper motor neuron type findings. As far as his neuropathy, we talked about causes for this, most typically, diabetes and prediabetes are the most common causes in this [place].  Alcohol can certainly be a cause for neuropathy especially if alcohol is consumed on a daily basis and several servings.  He is strongly advised to work on alcohol cessation.  He believes that he can stop drinking alcohol if he wants to.  We also talked about complicating factors including peripheral vascular disease and the link to smoking.  He is advised to quit smoking completely.  He has reduced his smoking. He has been taking vitamin B1 and B12 supplements.  He had recent blood work which I reviewed.  In addition, I would like to proceed with additional blood work today and further testing in the form of EMG nerve conduction velocity testing.  We will not be able to test the lower extremities due to the  wound on the left leg and severe swelling on the right.  I will ask for examination of the upper extremities.  We will also proceed with a neck MRI to look for degenerative changes and possible causes for radiculopathy versus myelopathy. We will plan a follow-up after testing and keep him posted by phone call in the interim as the test results come back.  We talked about the importance of healthy lifestyle, good hydration, good nutrition and working on smoking and alcohol cessation.  I answered all the questions today and the patient and his daughter Caleb [name] were in agreement with the plan.   Thank you very much for allowing me to participate in the care of this nice patient. If I can be of any further assistance to you please do not hesitate to call me at [phone].  Sincerely,   Star Age, MD, PhD

## 2020-03-08 ENCOUNTER — Telehealth: Payer: Self-pay | Admitting: Neurology

## 2020-03-08 NOTE — Telephone Encounter (Signed)
Mcarthur Rossetti Josem Kaufmann: [id number] (exp. 03/08/20 to 04/07/20) order sent to GI. They will reach out to the patient to schedule.

## 2020-03-10 ENCOUNTER — Telehealth: Payer: Self-pay | Admitting: *Deleted

## 2020-03-10 NOTE — Progress Notes (Signed)
Please call patient and advise him that his blood work for the most part was unremarkable  2 tests are pending which generally takes longer, that is the vitamin B1 and B6 levels, we will update if abnormal.  His vitamin B12 level was elevated above 2000, this indicates probable supplementation with vitamin B12, for now, he can discontinue taking extra vitamin B12.  In addition, 1 test was canceled because it was sent in the wrong tube, that is the heavy metal profile.  We can resend at a later date and request rechecking.  Also, 1 test showed extra antibodies in his bloodstream with increase in protein level.  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  We often request input from a hematologist for further evaluation and monitoring.  I would like to await our next test results and consider a referral to hematology after.

## 2020-03-10 NOTE — Telephone Encounter (Signed)
Called patietn and daughter Levada Dy answered, on [place]. She stated she was just reviewing his lab results. I informed erh that his blood work for the most part was unremarkable with 2 tests are pending which generally takes longer, that is the vitamin B1 and B6 levels. We will update if abnormal. His vitamin B12 level was elevated above 2000, this indicates probable supplementation with vitamin B12, for now, he can discontinue taking extra vitamin B12. In addition, 1 test was canceled because it was sent in the wrong tube, that is the heavy metal profile. We can resend at a later date and request rechecking. Also, 1 test showed extra antibodies in his bloodstream with increase in protein level. 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 Dr Rexene Alberts will often request input from a hematologist for further evaluation and monitoring. Dr Rexene Alberts would like to await our next test results and consider a referral to hematology after. She  verbalized understanding, appreciation.

## 2020-03-13 ENCOUNTER — Other Ambulatory Visit: Payer: Self-pay

## 2020-03-13 ENCOUNTER — Ambulatory Visit
Admission: RE | Admit: 2020-03-13 | Discharge: 2020-03-13 | Disposition: A | Discharge status: Home / Self Care | Payer: Medicare HMO | Source: Ambulatory Visit | Attending: Neurology | Admitting: Neurology

## 2020-03-13 DIAGNOSIS — IMO0001 Reserved for inherently not codable concepts without codable children: Secondary | ICD-10-CM

## 2020-03-13 DIAGNOSIS — M62529 Muscle wasting and atrophy, not elsewhere classified, unspecified upper arm: Secondary | ICD-10-CM

## 2020-03-13 DIAGNOSIS — Z789 Other specified health status: Secondary | ICD-10-CM

## 2020-03-13 DIAGNOSIS — G629 Polyneuropathy, unspecified: Secondary | ICD-10-CM

## 2020-03-13 DIAGNOSIS — I739 Peripheral vascular disease, unspecified: Secondary | ICD-10-CM

## 2020-03-13 DIAGNOSIS — M47892 Other spondylosis, cervical region: Secondary | ICD-10-CM

## 2020-03-13 DIAGNOSIS — F17200 Nicotine dependence, unspecified, uncomplicated: Secondary | ICD-10-CM

## 2020-03-13 DIAGNOSIS — R600 Localized edema: Secondary | ICD-10-CM

## 2020-03-15 ENCOUNTER — Telehealth: Payer: Self-pay

## 2020-03-15 NOTE — Telephone Encounter (Signed)
-----   Message from Star Age, MD sent at 03/15/2020  8:09 AM EST ----- Please call patient and advise him that his neck MRI showed significant degenerative changes at multiple levels, particularly in the upper cervical spine levels at C3 2-3, C3-4, C4-5 and C5-6.  There is evidence of spinal stenosis which means that the canal where there is spinal cord travels through is tightened which can cause pain and discomfort.  Also, there are other arthritis type changes around the areas where the nerve roots exit.  All in all, this can cause neck discomfort and also pinched nerve type findings which includes numbness and tingling and burning sensations.  Ultimately, this can also over time because weakness and muscle wasting.  I would recommend evaluation through the spine specialist.  If he is agreeable, we will make a referral to orthopedic surgery for evaluation of his degenerative neck disease.  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

## 2020-03-15 NOTE — Progress Notes (Signed)
Please call patient and advise him that his neck MRI showed significant degenerative changes at multiple levels, particularly in the upper cervical spine levels at C3 2-3, C3-4, C4-5 and C5-6.  There is evidence of spinal stenosis which means that the canal where there is spinal cord travels through is tightened which can cause pain and discomfort.  Also, there are other arthritis type changes around the areas where the nerve roots exit.  All in all, this can cause neck discomfort and also pinched nerve type findings which includes numbness and tingling and burning sensations.  Ultimately, this can also over time because weakness and muscle wasting.  I would recommend evaluation through the spine specialist.  If he is agreeable, we will make a referral to orthopedic surgery for evaluation of his degenerative neck disease.  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

## 2020-03-15 NOTE — Telephone Encounter (Signed)
I called patient's daughter and we reviewed the results. She and I discussed the option of placing a referral to orthopedic surgery, but she states that she would like to review the results with her dad first before deciding on the referral. I sent my chart message to the patient's daughter including Dr. Guadelupe Sabin results/recommendations. I advised the daughter to either call or message me back through my chart once she talks with her dad and what decision they make in regards to the referral. Patient will keep scheduled nerve conduction study on file for 03/24/2020/

## 2020-03-24 ENCOUNTER — Ambulatory Visit (INDEPENDENT_AMBULATORY_CARE_PROVIDER_SITE_OTHER): Payer: Medicare HMO | Admitting: Diagnostic Neuroimaging

## 2020-03-24 ENCOUNTER — Encounter: Payer: Medicare HMO | Admitting: Diagnostic Neuroimaging

## 2020-03-24 DIAGNOSIS — Z789 Other specified health status: Secondary | ICD-10-CM

## 2020-03-24 DIAGNOSIS — G629 Polyneuropathy, unspecified: Secondary | ICD-10-CM

## 2020-03-24 DIAGNOSIS — F17200 Nicotine dependence, unspecified, uncomplicated: Secondary | ICD-10-CM

## 2020-03-24 DIAGNOSIS — I739 Peripheral vascular disease, unspecified: Secondary | ICD-10-CM

## 2020-03-24 DIAGNOSIS — Z0289 Encounter for other administrative examinations: Secondary | ICD-10-CM

## 2020-03-24 DIAGNOSIS — IMO0001 Reserved for inherently not codable concepts without codable children: Secondary | ICD-10-CM

## 2020-03-24 DIAGNOSIS — R600 Localized edema: Secondary | ICD-10-CM

## 2020-03-24 DIAGNOSIS — M62529 Muscle wasting and atrophy, not elsewhere classified, unspecified upper arm: Secondary | ICD-10-CM

## 2020-03-24 DIAGNOSIS — M47892 Other spondylosis, cervical region: Secondary | ICD-10-CM

## 2020-03-29 NOTE — Procedures (Signed)
[facility]  NCS (NERVE CONDUCTION STUDY) WITH EMG (ELECTROMYOGRAPHY) REPORT   STUDY DATE: 03/24/20 PATIENT NAME: Caleb [name] DOB: 1950/05/19 MRN: [id number]  ORDERING CLINICIAN: Star Age, MD PhD   TECHNOLOGIST: Sherre Scarlet ELECTROMYOGRAPHER: Earlean Polka. Terence Googe, MD  CLINICAL INFORMATION: 70 year old male with upper extremity weakness, atrophy, neuropathy.  FINDINGS: NERVE CONDUCTION STUDY:  Bilateral median and ulnar motor responses of decreased amplitudes.  Right ulnar motor response has slowed conduction velocity with stimulation above the elbow (27 m/s).  Right median motor response has slightly prolonged distal latency.  Bilateral radial, bilateral median, left ulnar sensory sponsors of decreased amplitudes.  Right ulnar sensory response could not be obtained.  Right ulnar F-wave latency is prolonged.  Left ulnar F-wave latency is normal.   NEEDLE ELECTROMYOGRAPHY: Needle examination of the right upper extremity is normal except right first dorsal interosseous has abnormal spontaneous activity at rest and decreased motor unit recruitment on exertion.   IMPRESSION:   Abnormal study demonstrating: - Widespread axonal sensorimotor polyneuropathy. 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    INTERPRETING PHYSICIAN:  Penni Bombard, MD Certified in Neurology, Neurophysiology and Neuroimaging  [facility] [address], [place], [place] [address] [phone]   [facility]    Nerve / Sites Muscle Latency Ref. Amplitude Ref. Rel Amp Segments Distance Velocity Ref. Area    ms ms mV mV %  cm m/s m/s mVms  R Median - APB     Wrist APB 5.7 ?4.4 2.3 ?4.0 100 Wrist - APB 7   9.5     Upper arm APB 10.1  1.7  74 Upper arm - Wrist 22 49 ?49 6.8  L Median - APB     Wrist APB 4.4 ?4.4 1.7 ?4.0 100 Wrist - APB 7   7.9     Upper arm APB 8.5  1.8  105 Upper arm - Wrist 20 49 ?49 7.9  R Ulnar - ADM     Wrist ADM 3.1 ?3.3  2.6 ?6.0 100 Wrist - ADM 7   8.3     B.Elbow ADM 6.9  3.5  137 B.Elbow - Wrist 19 50 ?49 11.9     A.Elbow ADM 10.6  2.6  73.8 A.Elbow - B.Elbow 10 27 ?49 11.7  L Ulnar - ADM     Wrist ADM 3.3 ?3.3 5.6 ?6.0 100 Wrist - ADM 7   12.6     B.Elbow ADM 7.0  4.7  84.6 B.Elbow - Wrist 18 49 ?49 11.6     A.Elbow ADM 9.0  4.3  91.5 A.Elbow - B.Elbow 10 49 ?49 11.9             SNC    Nerve / Sites Rec. Site Peak Lat Ref.  Amp Ref. Segments Distance    ms ms V V  cm  R Radial - Anatomical snuff box (Forearm)     Forearm Wrist 2.9 ?2.9 5 ?15 Forearm - Wrist 10  L Radial - Anatomical snuff box (Forearm)     Forearm Wrist 2.9 ?2.9 8 ?15 Forearm - Wrist 10  R Median - Orthodromic (Dig II, Mid palm)     Dig II Wrist 4.4 ?3.4 6 ?10 Dig II - Wrist 13  L Median - Orthodromic (Dig II, Mid palm)     Dig II Wrist 4.0 ?3.4 4 ?10 Dig II - Wrist 13  R Ulnar - Orthodromic, (Dig V, Mid palm)     Dig  V Wrist NR ?3.1 NR ?5 Dig V - Wrist 11  L Ulnar - Orthodromic, (Dig V, Mid palm)     Dig V Wrist 3.1 ?3.1 2 ?5 Dig V - Wrist 74                 F  Wave    Nerve F Lat Ref.   ms ms  R Ulnar - ADM 40.4 ?32.0  L Ulnar - ADM 31.9 ?32.0         EMG Summary Table    Spontaneous MUAP Recruitment  Muscle IA Fib PSW Fasc Other Amp Dur. Poly Pattern  R. Deltoid Normal None None None _______ Normal Normal Normal Normal  R. Biceps brachii Normal None None None _______ Normal Normal Normal Normal  R. Triceps brachii Normal None None None _______ Normal Normal Normal Normal  R. Flexor carpi radialis Normal None None None _______ Normal Normal Normal Normal  R. First dorsal interosseous Normal 1+ 1+ None _______ Increased Normal Normal Reduced

## 2020-03-30 ENCOUNTER — Telehealth: Payer: Self-pay

## 2020-03-30 NOTE — Telephone Encounter (Signed)
-----   Message from Star Age, MD sent at 03/30/2020  1:14 PM EDT ----- Please advise patient that his electrical nerve and muscle tests are normal office on 03/24/2020 indicated evidence of nerve damage, what we call neuropathy.  As discussed, I would like to proceed with a referral to hematology based on abnormal protein content in his blood.  We will also proceed with a referral to orthopedics for degenerative neck disease.

## 2020-03-30 NOTE — Progress Notes (Signed)
Please advise patient that his electrical nerve and muscle tests are normal office on 03/24/2020 indicated evidence of nerve damage, what we call neuropathy.  As discussed, I would like to proceed with a referral to hematology based on abnormal protein content in his blood.  We will also proceed with a referral to orthopedics for degenerative neck disease.

## 2020-03-30 NOTE — Addendum Note (Signed)
Addended by: Star Age on: 03/30/2020 01:14 PM   Modules accepted: Orders

## 2020-03-30 NOTE — Telephone Encounter (Signed)
I called pt's daughter and left a vm advising of results and plan. ( ok per I advised the pt's daughter to call back if she had any questions.

## 2020-04-02 LAB — MULTIPLE MYELOMA PANEL, SERUM
Albumin SerPl Elph-Mcnc: 3.9 g/dL (ref 2.9–4.4)
Albumin/Glob SerPl: 1.3 (ref 0.7–1.7)
Alpha 1: 0.3 g/dL (ref 0.0–0.4)
Alpha2 Glob SerPl Elph-Mcnc: 0.8 g/dL (ref 0.4–1.0)
B-Globulin SerPl Elph-Mcnc: 1.1 g/dL (ref 0.7–1.3)
Gamma Glob SerPl Elph-Mcnc: 1 g/dL (ref 0.4–1.8)
Globulin, Total: 3.2 g/dL (ref 2.2–3.9)
IgA/Immunoglobulin A, Serum: 418 mg/dL (ref 61–437)
IgG (Immunoglobin G), Serum: 1190 mg/dL (ref 603–1613)
IgM (Immunoglobulin M), Srm: 61 mg/dL (ref 15–143)
M Protein SerPl Elph-Mcnc: 0.5 g/dL — ABNORMAL HIGH
Total Protein: 7.1 g/dL (ref 6.0–8.5)

## 2020-04-02 LAB — B12 AND FOLATE PANEL
Folate: >20 ng/mL (ref >3.0)
Vitamin B-12: >2000 pg/mL — ABNORMAL HIGH (ref 232–1245)

## 2020-04-02 LAB — VITAMIN B6: Vitamin B6: 7.8 ug/L (ref 5.3–46.7)

## 2020-04-02 LAB — ANA W/REFLEX: Anti Nuclear Antibody (ANA): NEGATIVE

## 2020-04-02 LAB — VITAMIN B1: Thiamine: 222 nmol/L — ABNORMAL HIGH (ref 66.5–200.0)

## 2020-04-02 LAB — TSH: TSH: 2.93 u[IU]/mL (ref 0.450–4.500)

## 2020-04-02 LAB — HEAVY METALS PROFILE II, BLOOD

## 2020-04-02 LAB — SEDIMENTATION RATE: Sed Rate: 2 mm/hr (ref 0–30)

## 2020-04-02 LAB — CK: Total CK: 88 U/L (ref 41–331)

## 2020-04-02 LAB — RPR: RPR Ser Ql: NONREACTIVE

## 2020-04-04 ENCOUNTER — Telehealth: Payer: Self-pay | Admitting: Physician Assistant

## 2020-04-04 NOTE — Telephone Encounter (Signed)
Received a new hem referral from Dr. Rexene Alberts for mgus. Caleb [name] has been scheduled to see Murray Hodgkins on 3/30 at 2pm. Appt date and time has been given to the pt's daughter, Janace Hoard. Aware to arrive 20 minutes early.

## 2020-04-08 NOTE — Progress Notes (Signed)
Date:  04/15/2020   ID:  Caleb [name], DOB 1950-06-14, MRN [id number]  PCP:  Percell Belt, DO  Cardiologist:  Jenkins Rouge, MD  Electrophysiologist:  None   Evaluation Performed:  Follow-Up Visit  Chief Complaint:  none  History of Present Illness:    70 y.o. with history of CAD/CABG in 2016. Last myovue done 12/28/19 was normal and nonischemic with EF 60%. Significant PVD followed at [facility]  With right fem-pop and bilateral stents for non healing left heel ulcer He takes low dose xarelto for his PVD  He has Rx HTN, HLD and continues to smoke Lung cancer screening CT negative 12/28/19   Discussed vaccine for COVID but he does not want to get it  He still has ulcer on left foot seeing wound center Has had hyponatremia and primary stopped his Aldactone, coreg and Irbesartan   Has seen neurology for neuropathy and right hand weakness EMG normal he had MRI with some  Spinal stenosis 03/13/20 not clear if this bad enough to cause clinical syndrome Neurology discussed Cutting back on ETOH in regard to "neuropathy "   Past Medical History:  Diagnosis Date  . Acute respiratory failure with hypoxia ([place])   . Acute systolic congestive heart failure ([place]) 02/11/2014  . COPD (chronic obstructive pulmonary disease) ([place]) 02/11/2014  . Essential hypertension   . Hypercholesteremia   . 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 Tobacco abuse    Past Surgical History:  Procedure Laterality Date  . BIOPSY  10/31/2018   Procedure: BIOPSY;  Surgeon: Carol Ada, MD;  Location: [facility];  Service: Endoscopy;;  . cataract surgery  Bilateral 12-2017 and 01-2018  . COLONOSCOPY WITH PROPOFOL N/A 10/31/2018   Procedure: COLONOSCOPY WITH PROPOFOL;  Surgeon: Carol Ada, MD;  Location: [facility];  Service: Endoscopy;  Laterality: N/A;  . COLONOSCOPY WITH PROPOFOL N/A 09/18/2019   Procedure: COLONOSCOPY WITH PROPOFOL;  Surgeon: Carol Ada, MD;   Location: [facility];  Service: Endoscopy;  Laterality: N/A;  . CORONARY ARTERY BYPASS GRAFT N/A 02/17/2014   Procedure: CORONARY ARTERY BYPASS GRAFTING (CABG);  Surgeon: Rexene Alberts, MD;  Location: [facility];  Service: Open Heart Surgery;  Laterality: N/A;  Times 3 using bilateral mammary arteries and endoscopically harvested right saphenous vein  . ESOPHAGOGASTRODUODENOSCOPY (EGD) WITH PROPOFOL N/A 10/31/2018   Procedure: ESOPHAGOGASTRODUODENOSCOPY (EGD) WITH PROPOFOL;  Surgeon: Carol Ada, MD;  Location: [facility];  Service: Endoscopy;  Laterality: N/A;  . ESOPHAGOGASTRODUODENOSCOPY (EGD) WITH PROPOFOL N/A 09/18/2019   Procedure: ESOPHAGOGASTRODUODENOSCOPY (EGD) WITH PROPOFOL;  Surgeon: Carol Ada, MD;  Location: [facility];  Service: Endoscopy;  Laterality: N/A;  . FEMORAL ARTERY - FEMORAL ARTERY BYPASS GRAFT      right femoral artery to below-knee popliteal artery bypass with PTFE and right first ray amputation 04/25/17  . HEMOSTASIS CLIP PLACEMENT  10/31/2018   Procedure: HEMOSTASIS CLIP PLACEMENT;  Surgeon: Carol Ada, MD;  Location: [facility];  Service: Endoscopy;;  . HEMOSTASIS CLIP PLACEMENT  09/18/2019   Procedure: HEMOSTASIS CLIP PLACEMENT;  Surgeon: Carol Ada, MD;  Location: [facility];  Service: Endoscopy;;  . INTRAOPERATIVE TRANSESOPHAGEAL ECHOCARDIOGRAM N/A 02/17/2014   Procedure: INTRAOPERATIVE TRANSESOPHAGEAL ECHOCARDIOGRAM;  Surgeon: Rexene Alberts, MD;  Location: [facility];  Service: Open Heart Surgery;  Laterality: N/A;  . LEFT HEART CATHETERIZATION WITH CORONARY ANGIOGRAM N/A 02/12/2014   Procedure: LEFT HEART CATHETERIZATION WITH CORONARY ANGIOGRAM;  Surgeon: Lynnell Dike  Blenda Bridegroom, MD;  [facility];  Service: Cardiovascular;  Laterality: N/A;  . POLYPECTOMY  10/31/2018   Procedure: POLYPECTOMY;  Surgeon: Carol Ada, MD;  Location: [facility];  Service: Endoscopy;;  . POLYPECTOMY  09/18/2019   Procedure: POLYPECTOMY;  Surgeon: Carol Ada, MD;   Location: [facility];  Service: Endoscopy;;  . Clide Deutscher  09/18/2019   Procedure: Clide Deutscher;  Surgeon: Carol Ada, MD;  Location: [facility];  Service: Endoscopy;;  . SUBMUCOSAL INJECTION  09/18/2019   Procedure: SUBMUCOSAL INJECTION;  Surgeon: Carol Ada, MD;  Location: [facility];  Service: Endoscopy;;  . SUBMUCOSAL LIFTING INJECTION  10/31/2018   Procedure: SUBMUCOSAL LIFTING INJECTION;  Surgeon: Carol Ada, MD;  Location: [facility];  Service: Endoscopy;;  . TOE AMPUTATION Right    right great toe      Current Meds  Medication Sig  . acetaminophen (TYLENOL) 500 MG tablet Take 1,000 mg by mouth 2 (two) times daily as needed for mild pain or moderate pain.   [name] albuterol (VENTOLIN HFA) 108 (90 Base) MCG/ACT inhaler Inhale 2 puffs into the lungs every 6 (six) hours as needed for wheezing or shortness of breath.  [name] amLODipine (NORVASC) 5 MG tablet Take 1 tablet (5 mg total) by mouth daily.  [name] atorvastatin (LIPITOR) 80 MG tablet Take 1 tablet (80 mg total) by mouth daily.  . carvedilol (COREG) 25 MG tablet Take 25 mg by mouth 2 (two) times daily with a meal.  . Cholecalciferol (VITAMIN D-3 PO) Take 2 capsules by mouth daily with breakfast.   . denosumab (PROLIA) 60 MG/ML SOSY injection Inject 1 mL into the skin every 6 (six) months.  . ferrous sulfate 325 (65 FE) MG tablet Take 1 tablet (325 mg total) by mouth daily with breakfast.  . furosemide (LASIX) 20 MG tablet Take 1 tablet (20 mg total) by mouth daily.  [name] gabapentin (NEURONTIN) 300 MG capsule Take 300 mg by mouth 3 (three) times daily.  . irbesartan (AVAPRO) 300 MG tablet Take 1 tablet (300 mg total) by mouth daily.  . nitroGLYCERIN (NITROSTAT) 0.4 MG SL tablet Place 1 tablet (0.4 mg total) under the tongue every 5 (five) minutes as needed for chest pain.  [name] Olopatadine HCl (PATADAY OP) Place 1 drop into both eyes daily as needed (allergies).  . pantoprazole (PROTONIX) 40 MG tablet Take 1 tablet (40 mg total) by  mouth daily.  . rivaroxaban (XARELTO) 2.5 MG TABS tablet Take 1 tablet (2.5 mg total) by mouth 2 (two) times daily.  . sodium chloride 1 g tablet Take by mouth. Take 1 tablet (1 g total) by mouth 3 times daily.  . THIAMINE HCL PO Take by mouth daily.  . vitamin B-12 (CYANOCOBALAMIN) 1000 MCG tablet Take 1,000 mcg by mouth daily with breakfast.     Allergies:   Sulfamethoxazole-trimethoprim, Cilostazol, Lisinopril, and Losartan   Social History   Tobacco Use  . Smoking status: Current Every Day Smoker    Packs/day: 0.50    Years: 35.00    Pack years: 17.50    Types: Cigarettes    Last attempt to quit: 02/11/2014    Years since quitting: 6.1  . Smokeless tobacco: Never Used  . Tobacco comment: has quit twice before   Vaping Use  . Vaping Use: Never used  Substance Use Topics  . Alcohol use: Yes    Alcohol/week: 0.0 standard drinks    Comment: Six pack a day of beer  . Drug use: Never  Family Hx: The patient's family history includes Cancer in his sister; Diabetes in his brother and mother; Heart attack in his brother and mother. There is no history of Stroke.  ROS:   Please see the history of present illness.    All other systems reviewed and are negative.   Prior CV studies:   The following studies were reviewed today: Echo July 2020  Labs/Other Tests and Data Reviewed:    EKG:  SR rate 69 PVC old IMI 08/25/18   Recent Labs: 08/07/2019: B Natriuretic Peptide 1,092.0 08/12/2019: Magnesium 2.1 03/07/2020: TSH 2.930 04/13/2020: ALT 10; BUN 10; Creatinine 0.82; Hemoglobin 14.2; Platelet Count 170; Potassium 4.6; Sodium 132   Recent Lipid Panel Lab Results  Component Value Date/Time   CHOL 176 12/27/2015 02:48 PM   TRIG 247 (H) 12/27/2015 02:48 PM   HDL 52 12/27/2015 02:48 PM   CHOLHDL 3.4 12/27/2015 02:48 PM   LDLCALC 75 12/27/2015 02:48 PM    Wt Readings from Last 3 Encounters:  04/15/20 62.6 kg  01/22/20 64.9 kg  12/28/19 65.3 kg     Objective:    Vital  Signs:  BP 126/70   Pulse 68   Ht 5\' 4"  (1.626 m)   Wt 62.6 kg   SpO2 98%   BMI 23.69 kg/m    Affect appropriate Chronically ill male  HEENT: normal Neck supple with no adenopathy JVP normal left bruits no thyromegaly Lungs clear with no wheezing and good diaphragmatic motion Heart:  S1/S2 no murmur, no rub, gallop or click PMI normal post sternotomy  Abdomen: benighn, BS positve, no tenderness, no AAA no bruit.  No HSM or HJR LLE in boot ulcer on heel almost gone with black eschar   ASSESSMENT & PLAN:    CABG x 3 2016- No recent angina non ischemic myovue 12/28/19 continue medical Rx  PVD- S/p RFP bypass  And stents followed by Dr Trudi Ida Still with ulcer on left Foot Discussed need for coordinated care by [facility] and wound center to avoid Amputation ? Hyperbaric oxygen He will need rehab when able to weight bear again   Chronic anticoagulation- On Xarelto for PVD no bleeding issues   HTN- Well controlled.  Continue current medications and low sodium Dash type diet.      HLD- On high dose statin Rx followed by PCP  Smoking:   Counseled on cessation <10 minutes lung cancer screening CT negative 12/28/19  GI:  Post polypectomy 09/18/19 f/u Dr Benson [place] Hct 30.3 on 08/12/19 Had transfusion 08/17/19 EGD done at same time negative   Bruit:  Left sided [place] 12/23/19 plaque no stenosis   Neuropathy:  From cardiac perspective could have surgery However I suspect this would Be put off until he is out of his boot and gone to rehab to gain strength. He has f/u with neuro Surgery    COVID-19 Education: The signs and symptoms of COVID-19 were discussed with the patient and how to seek care for testing (follow up with PCP or arrange E-visit).  The importance of social distancing was discussed today.   Medication Adjustments/Labs and Tests Ordered: Current medicines are reviewed at length with the patient today.  Concerns regarding medicines are outlined above.   Tests  Ordered:  None  Medication Changes: No orders of the defined types were placed in this encounter.   Follow Up:   6 months  Signed, Jenkins Rouge, MD  04/15/2020 3:08 PM    [facility]

## 2020-04-12 NOTE — Progress Notes (Signed)
[place] [phone]   [phone]  INITIAL CONSULT NOTE  Patient Care Team: Percell Belt, DO as PCP - General (Family Medicine) Josue Hector, MD as PCP - Cardiology (Cardiology)  Hematological/Oncological History #IgG Kappa Monoclonal Gammopathy of Undetermined Significance 1) 03/07/2020: Multiple Myeloma Panel revealed M potein 0.5 with IFE showing IgG monoclonal protein with kappa light chain specificity.  2) 04/12/2020: Establish care with Dede Query PA-C  CHIEF COMPLAINTS/PURPOSE OF CONSULTATION:  "Monoclonal gammapathy"  HISTORY OF PRESENTING ILLNESS:  Caleb [name] 70 y.o. male with medical history significant for CAD s/p CABG, HTN, HLD, peripheral vascular disease and peripheral neuropathy.   On review of the previous records, patient was seen by neurologist for chronic peripheral neuropathy affecting his upper and lower extremities. Workup included multiple myeloma panel that revealed a m-spike of 0.5 g/dL. Immunofixation showed IgG monoclonal protein with kappa light chain specificity.   On exam today, [name] was in a wheelchair due to ongoing left heel ulcer that is managed by wound care. He is currently wearing a heel offloading shoe. His energy levels are stable where he can complete his ADLs on his own. He has good appetite without any noticeable weight loss. He denies any nausea, vomiting or abdominal pain. He has normal bowel habits without any hematochezia or melena. Patient denies any dysuria or hematuria. Patient has chronic neck pain. He denies any fevers, chills, shortness of breath, chest pain or cough. He has no other complaints. Rest of the 10 point ROS is negative.    MEDICAL HISTORY:  Past Medical History:  Diagnosis Date  . Acute respiratory failure with hypoxia ([place])   . Acute systolic congestive heart failure ([place]) 02/11/2014  . COPD (chronic obstructive pulmonary disease) ([place]) 02/11/2014  . Essential hypertension    . Hypercholesteremia   . 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 Tobacco abuse     SURGICAL HISTORY: Past Surgical History:  Procedure Laterality Date  . BIOPSY  10/31/2018   Procedure: BIOPSY;  Surgeon: Carol Ada, MD;  Location: [facility];  Service: Endoscopy;;  . cataract surgery  Bilateral 12-2017 and 01-2018  . COLONOSCOPY WITH PROPOFOL N/A 10/31/2018   Procedure: COLONOSCOPY WITH PROPOFOL;  Surgeon: Carol Ada, MD;  Location: [facility];  Service: Endoscopy;  Laterality: N/A;  . COLONOSCOPY WITH PROPOFOL N/A 09/18/2019   Procedure: COLONOSCOPY WITH PROPOFOL;  Surgeon: Carol Ada, MD;  [facility];  Service: Endoscopy;  Laterality: N/A;  . CORONARY ARTERY BYPASS GRAFT N/A 02/17/2014   Procedure: CORONARY ARTERY BYPASS GRAFTING (CABG);  Surgeon: Rexene Alberts, MD;  Location: [facility];  Service: Open Heart Surgery;  Laterality: N/A;  Times 3 using bilateral mammary arteries and endoscopically harvested right saphenous vein  . ESOPHAGOGASTRODUODENOSCOPY (EGD) WITH PROPOFOL N/A 10/31/2018   Procedure: ESOPHAGOGASTRODUODENOSCOPY (EGD) WITH PROPOFOL;  Surgeon: Carol Ada, MD;  [facility];  Service: Endoscopy;  Laterality: N/A;  . ESOPHAGOGASTRODUODENOSCOPY (EGD) WITH PROPOFOL N/A 09/18/2019   Procedure: ESOPHAGOGASTRODUODENOSCOPY (EGD) WITH PROPOFOL;  Surgeon: Carol Ada, MD;  [facility];  Service: Endoscopy;  Laterality: N/A;  . FEMORAL ARTERY - FEMORAL ARTERY BYPASS GRAFT      right femoral artery to below-knee popliteal artery bypass with PTFE and right first ray amputation 04/25/17  . HEMOSTASIS CLIP PLACEMENT  10/31/2018   Procedure: HEMOSTASIS CLIP PLACEMENT;  Surgeon: Carol Ada, MD;  [facility];  Service:  Endoscopy;;  . HEMOSTASIS CLIP PLACEMENT  09/18/2019   Procedure: HEMOSTASIS CLIP PLACEMENT;  Surgeon: Carol Ada, MD;  [facility];  Service: Endoscopy;;  .  INTRAOPERATIVE TRANSESOPHAGEAL ECHOCARDIOGRAM N/A 02/17/2014   Procedure: INTRAOPERATIVE TRANSESOPHAGEAL ECHOCARDIOGRAM;  Surgeon: Rexene Alberts, MD;  Location: [facility];  Service: Open Heart Surgery;  Laterality: N/A;  . LEFT HEART CATHETERIZATION WITH CORONARY ANGIOGRAM N/A 02/12/2014   Procedure: LEFT HEART CATHETERIZATION WITH CORONARY ANGIOGRAM;  Surgeon: Sinclair Grooms, MD;  [facility]: [facility];  Service: Cardiovascular;  Laterality: N/A;  . POLYPECTOMY  10/31/2018   Procedure: POLYPECTOMY;  Surgeon: Carol Ada, MD;  [facility];  Service: Endoscopy;;  . POLYPECTOMY  09/18/2019   Procedure: POLYPECTOMY;  Surgeon: Carol Ada, MD;  [facility];  Service: Endoscopy;;  . Clide Deutscher  09/18/2019   Procedure: Clide Deutscher;  Surgeon: Carol Ada, MD;  [facility];  Service: Endoscopy;;  . SUBMUCOSAL INJECTION  09/18/2019   Procedure: SUBMUCOSAL INJECTION;  Surgeon: Carol Ada, MD;  [facility];  Service: Endoscopy;;  . SUBMUCOSAL LIFTING INJECTION  10/31/2018   Procedure: SUBMUCOSAL LIFTING INJECTION;  Surgeon: Carol Ada, MD;  [facility];  Service: Endoscopy;;  . TOE AMPUTATION Right    right great toe     SOCIAL HISTORY: Social History   Socioeconomic History  . Marital status: Single    Spouse name: Not on file  . Number of children: Not on file  . Years of education: Not on file  . Highest education level: Not on file  Occupational History  . Occupation: metal work  Tobacco Use  . Smoking status: Current Every Day Smoker    Packs/day: 0.50    Years: 35.00    Pack years: 17.50    Types: Cigarettes    Last attempt to quit: 02/11/2014    Years since quitting: 6.1  . Smokeless tobacco: Never Used  . Tobacco comment: has quit twice before   Vaping Use  . Vaping Use: Never used  Substance and Sexual Activity  . Alcohol use: Yes    Alcohol/week: 0.0 standard drinks    Comment: Six pack a day of beer  . Drug  use: Never  . Sexual activity: Not on file  Other Topics Concern  . Not on file  Social History Narrative  . Not on file   Social Determinants of Health   Financial Resource Strain: Not on file  Food Insecurity: Not on file  Transportation Needs: Not on file  Physical Activity: Not on file  Stress: Not on file  Social Connections: Not on file  Intimate Partner Violence: Not on file    FAMILY HISTORY: Family History  Problem Relation Age of Onset  . Heart attack Mother   . Diabetes Mother   . Heart attack Brother   . Diabetes Brother   . Cancer Sister   . Stroke Neg Hx     ALLERGIES:  is allergic to sulfamethoxazole-trimethoprim, cilostazol, lisinopril, and losartan.  MEDICATIONS:  Current Outpatient Medications  Medication Sig Dispense Refill  . acetaminophen (TYLENOL) 500 MG tablet Take 1,000 mg by mouth 2 (two) times daily as needed for mild pain or moderate pain.     [name] albuterol (VENTOLIN HFA) 108 (90 Base) MCG/ACT inhaler Inhale 2 puffs into the lungs every 6 (six) hours as needed for wheezing or shortness of breath. 18 g 3  . atorvastatin (LIPITOR) 80 MG tablet Take 1 tablet (80 mg total) by mouth daily. 90 tablet 3  .  carvedilol (COREG) 25 MG tablet Take 25 mg by mouth 2 (two) times daily with a meal.    . Cholecalciferol (VITAMIN D-3 PO) Take 2 capsules by mouth daily with breakfast.     . gabapentin (NEURONTIN) 300 MG capsule Take 300 mg by mouth 3 (three) times daily.    . nitroGLYCERIN (NITROSTAT) 0.4 MG SL tablet Place 1 tablet (0.4 mg total) under the tongue every 5 (five) minutes as needed for chest pain. 25 tablet 3  . Olopatadine HCl (PATADAY OP) Place 1 drop into both eyes daily as needed (allergies).    . THIAMINE HCL PO Take by mouth daily.    . vitamin B-12 (CYANOCOBALAMIN) 1000 MCG tablet Take 1,000 mcg by mouth daily with breakfast.    . amLODipine (NORVASC) 5 MG tablet Take 1 tablet (5 mg total) by mouth daily. 30 tablet 0  . ferrous sulfate 325 (65  FE) MG tablet Take 1 tablet (325 mg total) by mouth daily with breakfast. 30 tablet 0  . furosemide (LASIX) 20 MG tablet Take 1 tablet (20 mg total) by mouth daily. 30 tablet 0  . irbesartan (AVAPRO) 300 MG tablet Take 1 tablet (300 mg total) by mouth daily. 30 tablet 0  . pantoprazole (PROTONIX) 40 MG tablet Take 1 tablet (40 mg total) by mouth daily. 30 tablet 0  . rivaroxaban (XARELTO) 2.5 MG TABS tablet Take 1 tablet (2.5 mg total) by mouth 2 (two) times daily. 60 tablet 0   No current facility-administered medications for this visit.    REVIEW OF SYSTEMS:   Constitutional: ( - ) fevers, ( - )  chills , ( - ) night sweats Eyes: ( - ) blurriness of vision, ( - ) double vision, ( - ) watery eyes Ears, nose, mouth, throat, and face: ( - ) mucositis, ( - ) sore throat Respiratory: ( - ) cough, ( - ) dyspnea, ( - ) wheezes Cardiovascular: ( - ) palpitation, ( - ) chest discomfort, ( - ) lower extremity swelling Gastrointestinal:  ( - ) nausea, ( - ) heartburn, ( - ) change in bowel habits Skin: ( - ) abnormal skin rashes Lymphatics: ( - ) new lymphadenopathy, ( - ) easy bruising Neurological: ( +) numbness, ( - ) tingling, ( - ) new weaknesses Behavioral/Psych: ( - ) mood change, ( - ) new changes  All other systems were reviewed with the patient and are negative.  PHYSICAL EXAMINATION: ECOG PERFORMANCE STATUS: 1 - Symptomatic but completely ambulatory  Vitals:   04/13/20 1404  BP: (!) 144/49  Pulse: 66  Resp: 19  Temp: 97.6 F (36.4 C)  SpO2: 100%   Filed Weights    GENERAL: male in no acute distress.  SKIN: skin color, texture, turgor are normal, no rashes or significant lesions EYES: conjunctiva are pink and non-injected, sclera clear OROPHARYNX: no exudate, no erythema; lips, buccal mucosa, and tongue normal  NECK: supple, non-tender LYMPH:  no palpable lymphadenopathy in the cervical, axillary or supraclavicular lymph nodes.  LUNGS: clear to auscultation and percussion  with normal breathing effort HEART: regular rate & rhythm and no murmurs and no lower extremity edema ABDOMEN: soft, non-tender, non-distended, normal bowel sounds Musculoskeletal: no cyanosis of digits and no clubbing  PSYCH: alert & oriented x 3, fluent speech NEURO: no focal motor/sensory deficits  LABORATORY DATA:  I have reviewed the data as listed CBC Latest Ref Rng & Units 08/12/2019 08/11/2019 08/10/2019  WBC 4.0 - 10.5 K/uL - 6.3 6.8  Hemoglobin 13.0 - 17.0 g/dL 9.5(L) 9.4(L) 9.1(L)  Hematocrit 39.0 - 52.0 % 30.3(L) 30.9(L) 29.2(L)  Platelets 150 - 400 K/uL - 223 207    CMP Latest Ref Rng & Units 03/07/2020 08/13/2019 08/12/2019  Glucose 70 - 99 mg/dL - 179(H) 114(H)  BUN 8 - 23 mg/dL - 19 17  Creatinine 0.61 - 1.24 mg/dL - 0.88 0.86  Sodium 135 - 145 mmol/L - 129(L) 129(L)  Potassium 3.5 - 5.1 mmol/L - 3.7 3.9  Chloride 98 - 111 mmol/L - 98 97(L)  CO2 22 - 32 mmol/L - 22 22  Calcium 8.9 - 10.3 mg/dL - 9.0 8.9  Total Protein 6.0 - 8.5 g/dL 7.1 - -  Total Bilirubin 0.3 - 1.2 mg/dL - - -  Alkaline Phos 38 - 126 U/L - - -  AST 15 - 41 U/L - - -  ALT 0 - 44 U/L - - -   RADIOGRAPHIC STUDIES: I have personally reviewed the radiological images as listed and agreed with the findings in the report. NCV with EMG(electromyography)  Result Date: 03/24/2020 Penni Bombard, MD     03/29/2020  4:42 PM  [facility] NCS (NERVE CONDUCTION STUDY) WITH EMG (ELECTROMYOGRAPHY) REPORT STUDY DATE: 03/24/20 PATIENT NAME: ADONIJAH BAENA DOB: 1951-01-11 MRN: [id number] ORDERING CLINICIAN: Star Age, MD PhD TECHNOLOGIST: Sherre Scarlet ELECTROMYOGRAPHER: Earlean Polka. Penumalli, MD CLINICAL INFORMATION: 70 year old male with upper extremity weakness, atrophy, neuropathy. FINDINGS: NERVE CONDUCTION STUDY: Bilateral median and ulnar motor responses of decreased amplitudes.  Right ulnar motor response has slowed conduction velocity with stimulation above the elbow (27 m/s).  Right median motor response  has slightly prolonged distal latency. Bilateral radial, bilateral median, left ulnar sensory sponsors of decreased amplitudes.  Right ulnar sensory response could not be obtained. Right ulnar F-wave latency is prolonged.  Left ulnar F-wave latency is normal. NEEDLE ELECTROMYOGRAPHY: Needle examination of the right upper extremity is normal except right first dorsal interosseous has abnormal spontaneous activity at rest and decreased motor unit recruitment on exertion. IMPRESSION: Abnormal study demonstrating: - Widespread axonal sensorimotor polyneuropathy. 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 INTERPRETING PHYSICIAN: Penni Bombard, MD Certified in Neurology, Neurophysiology and Neuroimaging [facility] [address] [place],  [address] [phone] [facility]   Nerve / Sites Muscle Latency Ref. Amplitude Ref. Rel Amp Segments Distance Velocity Ref. Area   ms ms mV mV %  cm m/s m/s mVms R Median - APB    Wrist APB 5.7 ?4.4 2.3 ?4.0 100 Wrist - APB 7   9.5    Upper arm APB 10.1  1.7  74 Upper arm - Wrist 22 49 ?49 6.8 L Median - APB    Wrist APB 4.4 ?4.4 1.7 ?4.0 100 Wrist - APB 7   7.9    Upper arm APB 8.5  1.8  105 Upper arm - Wrist 20 49 ?49 7.9 R Ulnar - ADM    Wrist ADM 3.1 ?3.3 2.6 ?6.0 100 Wrist - ADM 7   8.3    B.Elbow ADM 6.9  3.5  137 B.Elbow - Wrist 19 50 ?49 11.9    A.Elbow ADM 10.6  2.6  73.8 A.Elbow - B.Elbow 10 27 ?49 11.7 L Ulnar - ADM    Wrist ADM 3.3 ?3.3 5.6 ?6.0 100 Wrist - ADM 7   12.6    B.Elbow ADM 7.0  4.7  84.6 B.Elbow - Wrist 18 49 ?49 11.6    A.Elbow ADM  9.0  4.3  91.5 A.Elbow - B.Elbow 10 49 ?49 11.9           SNC   Nerve / Sites Rec. Site Peak Lat Ref.  Amp Ref. Segments Distance   ms ms V V  cm R Radial - Anatomical snuff box (Forearm)    Forearm Wrist 2.9 ?2.9 5 ?15 Forearm - Wrist 10 L Radial - Anatomical snuff box (Forearm)    Forearm Wrist 2.9 ?2.9 8 ?15 Forearm - Wrist 10 R Median - Orthodromic (Dig II, Mid palm)    Dig II Wrist 4.4  ?3.4 6 ?10 Dig II - Wrist 13 L Median - Orthodromic (Dig II, Mid palm)    Dig II Wrist 4.0 ?3.4 4 ?10 Dig II - Wrist 13 R Ulnar - Orthodromic, (Dig V, Mid palm)    Dig V Wrist NR ?3.1 NR ?5 Dig V - Wrist 11 L Ulnar - Orthodromic, (Dig V, Mid palm)    Dig V Wrist 3.1 ?3.1 2 ?5 Dig V - Wrist 57               F  Wave   Nerve F Lat Ref.  ms ms R Ulnar - ADM 40.4 ?32.0 L Ulnar - ADM 31.9 ?32.0       EMG Summary Table   Spontaneous MUAP Recruitment Muscle IA Fib PSW Fasc Other Amp Dur. Poly Pattern R. Deltoid Normal None None None _______ Normal Normal Normal Normal R. Biceps brachii Normal None None None _______ Normal Normal Normal Normal R. Triceps brachii Normal None None None _______ Normal Normal Normal Normal R. Flexor carpi radialis Normal None None None _______ Normal Normal Normal Normal R. First dorsal interosseous Normal 1+ 1+ None _______ Increased Normal Normal Reduced     ASSESSMENT & PLAN ABID BOLLA is a 70 y.o. male who presents to the clinic for evaluation for monoclonal gammopathy. Reviewed recent SPEP with IFE results from 03/07/20 that indicate a M-spike of 0.5. IFE shows IgG monoclonal protein with kappa light chain specificity. Recommend further workup with CBC w/ diff, CMP, LDH, beta 2 microglobulin, UPEP, kappa/lambda light chains and DG bone met survey. We will follow up with the patient by phone. If no further intervention is indicated, plan to see patient back in clinic in 3 months with labs.   #MGUS: --SPEP with IFE from 03/07/20 showed M-spike of 0.5, IgG monoclonal protein with kappa light chain specificity. --Need further workup with CBC w/ diff, CMP, LDH, beta 2 microglobulin, UPEP, kappa/lambda light chains --Will obtain DG bone met survey to assess for lytic lesions.  --Will consider a bone marrow biopsy based on above results.  --RTC in 3 months with labs unless further workup is needed.   Orders Placed This Encounter  Procedures  . DG Bone Survey Met    Standing Status:    Future    Standing Expiration Date:   04/12/2021    Order Specific Question:   Reason for Exam (SYMPTOM  OR DIAGNOSIS REQUIRED)    Answer:   m-protein spike. evaluate for lytic lesions    Order Specific Question:   Preferred imaging location?    Answer:   [facility]  . CBC with Differential ([place] Only)    Standing Status:   Future    Standing Expiration Date:   04/12/2021  . CMP ([place] only)    Standing Status:   Future    Standing Expiration Date:   04/12/2021  . Lactate  dehydrogenase (LDH)    Standing Status:   Future    Standing Expiration Date:   04/12/2021  . Beta 2 microglobulin    Standing Status:   Future    Standing Expiration Date:   04/12/2021  . 24-Hr Ur UPEP/UIFE/Light Chains/TP    Standing Status:   Future    Standing Expiration Date:   04/12/2021  . Kappa/lambda light chains    Standing Status:   Future    Standing Expiration Date:   04/12/2021    All questions were answered. The patient knows to call the clinic with any problems, questions or concerns.  A total of more than 60 minutes were spent on this encounter and over half of that time was spent on counseling and coordination of care as outlined above.    Dede Query, PA-C Department of Hematology/Oncology [place] at [facility] Phone: [phone]  Patient was seen with Dr. Lorenso Courier.   I have read the above note and personally examined the patient. I agree with the assessment and plan as noted above.  Briefly Mr. Taishawn Smaldone is a 70 year old Caucasian male with medical history significant for an IgG kappa monoclonal gammopathy of undetermined significance.  We have an SPEP which shows a mild M protein.  In order to complete the work-up we will order serum free light chains, UPEP, LDH, beta-2 microglobulin, baseline CBC, and CMP.  Additionally we will order a metastatic survey to assess for lytic lesions.  Assuming all of the above work-up is negative we will  have the patient return in approximately 3 months for repeat evaluation.  If there are any concerning findings we will proceed with bone marrow biopsy for further investigation.   Ledell Peoples, MD Department of Hematology/Oncology [place] at [facility] Phone: [phone] Pager: [phone] Email: [email or web address]

## 2020-04-13 ENCOUNTER — Encounter: Payer: Self-pay | Admitting: Physician Assistant

## 2020-04-13 ENCOUNTER — Other Ambulatory Visit: Payer: Self-pay

## 2020-04-13 ENCOUNTER — Inpatient Hospital Stay: Payer: Medicare HMO | Admitting: Hematology and Oncology

## 2020-04-13 ENCOUNTER — Inpatient Hospital Stay: Payer: Medicare HMO | Attending: Physician Assistant | Admitting: Physician Assistant

## 2020-04-13 VITALS — BP 144/49 | HR 66 | Temp 97.6°F | Resp 19 | Ht 64.0 in

## 2020-04-13 DIAGNOSIS — Z79899 Other long term (current) drug therapy: Secondary | ICD-10-CM | POA: Insufficient documentation

## 2020-04-13 DIAGNOSIS — I509 Heart failure, unspecified: Secondary | ICD-10-CM | POA: Insufficient documentation

## 2020-04-13 DIAGNOSIS — I110 Hypertensive heart disease with heart failure: Secondary | ICD-10-CM | POA: Insufficient documentation

## 2020-04-13 DIAGNOSIS — G629 Polyneuropathy, unspecified: Secondary | ICD-10-CM | POA: Diagnosis not present

## 2020-04-13 DIAGNOSIS — I2510 Atherosclerotic heart disease of native coronary artery without angina pectoris: Secondary | ICD-10-CM | POA: Diagnosis not present

## 2020-04-13 DIAGNOSIS — F17210 Nicotine dependence, cigarettes, uncomplicated: Secondary | ICD-10-CM | POA: Insufficient documentation

## 2020-04-13 DIAGNOSIS — Z7901 Long term (current) use of anticoagulants: Secondary | ICD-10-CM | POA: Insufficient documentation

## 2020-04-13 DIAGNOSIS — E785 Hyperlipidemia, unspecified: Secondary | ICD-10-CM | POA: Insufficient documentation

## 2020-04-13 DIAGNOSIS — I739 Peripheral vascular disease, unspecified: Secondary | ICD-10-CM | POA: Diagnosis not present

## 2020-04-13 DIAGNOSIS — Z951 Presence of aortocoronary bypass graft: Secondary | ICD-10-CM | POA: Insufficient documentation

## 2020-04-13 DIAGNOSIS — E7800 Pure hypercholesterolemia, unspecified: Secondary | ICD-10-CM | POA: Insufficient documentation

## 2020-04-13 DIAGNOSIS — J449 Chronic obstructive pulmonary disease, unspecified: Secondary | ICD-10-CM | POA: Diagnosis not present

## 2020-04-13 DIAGNOSIS — D472 Monoclonal gammopathy: Secondary | ICD-10-CM | POA: Insufficient documentation

## 2020-04-13 LAB — CBC WITH DIFFERENTIAL (CANCER CENTER ONLY)
Abs Immature Granulocytes: 0.02 10*3/uL (ref 0.00–0.07)
Basophils Absolute: 0 10*3/uL (ref 0.0–0.1)
Basophils Relative: 1 %
Eosinophils Absolute: 0.1 10*3/uL (ref 0.0–0.5)
Eosinophils Relative: 2 %
HCT: 40.4 % (ref 39.0–52.0)
Hemoglobin: 14.2 g/dL (ref 13.0–17.0)
Immature Granulocytes: 0 %
Lymphocytes Relative: 13 %
Lymphs Abs: 0.9 10*3/uL (ref 0.7–4.0)
MCH: 32.9 pg (ref 26.0–34.0)
MCHC: 35.1 g/dL (ref 30.0–36.0)
MCV: 93.5 fL (ref 80.0–100.0)
Monocytes Absolute: 0.6 10*3/uL (ref 0.1–1.0)
Monocytes Relative: 8 %
Neutro Abs: 5.1 10*3/uL (ref 1.7–7.7)
Neutrophils Relative %: 76 %
Platelet Count: 170 10*3/uL (ref 150–400)
RBC: 4.32 MIL/uL (ref 4.22–5.81)
RDW: 12.1 % (ref 11.5–15.5)
WBC Count: 6.7 10*3/uL (ref 4.0–10.5)
nRBC: 0 % (ref 0.0–0.2)

## 2020-04-13 LAB — CMP (CANCER CENTER ONLY)
ALT: 10 U/L (ref 0–44)
AST: 19 U/L (ref 15–41)
Albumin: 4.1 g/dL (ref 3.5–5.0)
Alkaline Phosphatase: 65 U/L (ref 38–126)
Anion gap: 13 (ref 5–15)
BUN: 10 mg/dL (ref 8–23)
CO2: 25 mmol/L (ref 22–32)
Calcium: 9.7 mg/dL (ref 8.9–10.3)
Chloride: 94 mmol/L — ABNORMAL LOW (ref 98–111)
Creatinine: 0.82 mg/dL (ref 0.61–1.24)
GFR, Estimated: >60 mL/min (ref >60)
Glucose, Bld: 110 mg/dL — ABNORMAL HIGH (ref 70–99)
Potassium: 4.6 mmol/L (ref 3.5–5.1)
Sodium: 132 mmol/L — ABNORMAL LOW (ref 135–145)
Total Bilirubin: 0.6 mg/dL (ref 0.3–1.2)
Total Protein: 7.7 g/dL (ref 6.5–8.1)

## 2020-04-13 LAB — LACTATE DEHYDROGENASE: LDH: 180 U/L (ref 98–192)

## 2020-04-14 LAB — KAPPA/LAMBDA LIGHT CHAINS
Kappa free light chain: 30.4 mg/L — ABNORMAL HIGH (ref 3.3–19.4)
Kappa, lambda light chain ratio: 1.33 (ref 0.26–1.65)
Lambda free light chains: 22.8 mg/L (ref 5.7–26.3)

## 2020-04-14 LAB — BETA 2 MICROGLOBULIN, SERUM: Beta-2 Microglobulin: 2.2 mg/L (ref 0.6–2.4)

## 2020-04-15 ENCOUNTER — Encounter: Payer: Self-pay | Admitting: Cardiovascular Disease

## 2020-04-15 ENCOUNTER — Ambulatory Visit: Payer: Medicare HMO | Admitting: Cardiovascular Disease

## 2020-04-15 ENCOUNTER — Other Ambulatory Visit: Payer: Self-pay

## 2020-04-15 VITALS — BP 126/70 | HR 68 | Ht 64.0 in | Wt 138.0 lb

## 2020-04-15 DIAGNOSIS — I739 Peripheral vascular disease, unspecified: Secondary | ICD-10-CM | POA: Diagnosis not present

## 2020-04-15 DIAGNOSIS — Z951 Presence of aortocoronary bypass graft: Secondary | ICD-10-CM | POA: Diagnosis not present

## 2020-04-15 NOTE — Progress Notes (Signed)
Assuming a normal bone scan and UPEP, 3 month f/u would be appropriate.

## 2020-04-15 NOTE — Patient Instructions (Signed)

## 2020-04-18 ENCOUNTER — Telehealth: Payer: Self-pay | Admitting: Physician Assistant

## 2020-04-18 NOTE — Telephone Encounter (Signed)
Scheduled per los. Called and spoke with patients daughter, Levada Dy. Confirmed appt

## 2020-04-20 ENCOUNTER — Other Ambulatory Visit: Payer: Self-pay

## 2020-04-20 ENCOUNTER — Encounter: Payer: Self-pay | Admitting: Orthopaedic Surgery

## 2020-04-20 ENCOUNTER — Ambulatory Visit: Payer: Medicare HMO | Admitting: Orthopaedic Surgery

## 2020-04-20 ENCOUNTER — Other Ambulatory Visit: Payer: Self-pay | Admitting: *Deleted

## 2020-04-20 ENCOUNTER — Ambulatory Visit (HOSPITAL_COMMUNITY)
Admission: RE | Admit: 2020-04-20 | Discharge: 2020-04-20 | Disposition: A | Discharge status: Home / Self Care | Payer: Medicare HMO | Source: Ambulatory Visit | Attending: Physician Assistant | Admitting: Physician Assistant

## 2020-04-20 DIAGNOSIS — D472 Monoclonal gammopathy: Secondary | ICD-10-CM | POA: Diagnosis not present

## 2020-04-20 DIAGNOSIS — M4802 Spinal stenosis, cervical region: Secondary | ICD-10-CM
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
## 2020-04-21 DIAGNOSIS — M4802 Spinal stenosis, cervical region: Secondary | ICD-10-CM | POA: Insufficient documentation

## 2020-04-22 LAB — UPEP/UIFE/LIGHT CHAINS/TP, 24-HR UR
% BETA, Urine: 19 %
ALPHA 1 URINE: 4.5 %
Albumin, U: 67.9 %
Alpha 2, Urine: 4.3 %
Free Kappa Lt Chains,Ur: 14.94 mg/L (ref 1.17–86.46)
Free Kappa/Lambda Ratio: 4.9 (ref 1.83–14.26)
Free Lambda Lt Chains,Ur: 3.05 mg/L (ref 0.27–15.21)
GAMMA GLOBULIN URINE: 4.3 %
Total Protein, Urine-Ur/day: 199 mg/24 hr — ABNORMAL HIGH (ref 30–150)
Total Protein, Urine: 10.2 mg/dL
Total Volume: 1950

## 2020-04-25 ENCOUNTER — Telehealth: Payer: Self-pay | Admitting: Physician Assistant

## 2020-04-25 NOTE — Telephone Encounter (Signed)
I spoke to [name], daughter of Mr. Caleb [name]. 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 UPEP did not reveal M-spike but showed IgG monoclonal protein with kappa light chain  specificity. Bone survey did not reveal any lytic lesions. Other labs were unremarkable. No further workup is required including bone marrow biopsy.   Recommend to repeat labs in 3 months which has been scheduled for 07/25/2020. [name] expressed understanding and satisfaction with the plan provided.

## 2020-07-20 ENCOUNTER — Telehealth: Payer: Self-pay | Admitting: Physician Assistant

## 2020-07-20 NOTE — Telephone Encounter (Signed)
R/s per 3/30 los per staff msg, pt daughter aware, says she will call to reschedule.

## 2020-07-22 ENCOUNTER — Telehealth: Payer: Self-pay | Admitting: Physician Assistant

## 2020-07-22 NOTE — Telephone Encounter (Signed)
R/s per pt request,pt daughter made aware

## 2020-07-25 ENCOUNTER — Other Ambulatory Visit: Payer: Medicare HMO

## 2020-07-25 ENCOUNTER — Ambulatory Visit: Payer: Medicare HMO | Admitting: Physician Assistant

## 2020-07-29 ENCOUNTER — Ambulatory Visit: Payer: Medicare HMO | Admitting: Physician Assistant

## 2020-07-29 ENCOUNTER — Other Ambulatory Visit: Payer: Medicare HMO

## 2020-08-01 ENCOUNTER — Encounter: Payer: Self-pay | Admitting: Hematology and Oncology

## 2020-08-01 ENCOUNTER — Other Ambulatory Visit: Payer: Self-pay | Admitting: Hematology and Oncology

## 2020-08-01 ENCOUNTER — Other Ambulatory Visit: Payer: Self-pay

## 2020-08-01 ENCOUNTER — Inpatient Hospital Stay: Payer: Medicare HMO | Admitting: Hematology and Oncology

## 2020-08-01 ENCOUNTER — Inpatient Hospital Stay: Payer: Medicare HMO | Attending: Hematology and Oncology

## 2020-08-01 VITALS — BP 155/60 | HR 62 | Temp 98.2°F | Resp 17 | Wt 148.5 lb

## 2020-08-01 DIAGNOSIS — D472 Monoclonal gammopathy: Secondary | ICD-10-CM | POA: Insufficient documentation

## 2020-08-01 LAB — CMP (CANCER CENTER ONLY)
ALT: 8 U/L (ref 0–44)
AST: 16 U/L (ref 15–41)
Albumin: 3.9 g/dL (ref 3.5–5.0)
Alkaline Phosphatase: 71 U/L (ref 38–126)
Anion gap: 11 (ref 5–15)
BUN: 7 mg/dL — ABNORMAL LOW (ref 8–23)
CO2: 23 mmol/L (ref 22–32)
Calcium: 9.6 mg/dL (ref 8.9–10.3)
Chloride: 97 mmol/L — ABNORMAL LOW (ref 98–111)
Creatinine: 0.79 mg/dL (ref 0.61–1.24)
GFR, Estimated: >60 mL/min (ref >60)
Glucose, Bld: 89 mg/dL (ref 70–99)
Potassium: 4.4 mmol/L (ref 3.5–5.1)
Sodium: 131 mmol/L — ABNORMAL LOW (ref 135–145)
Total Bilirubin: 0.5 mg/dL (ref 0.3–1.2)
Total Protein: 7.9 g/dL (ref 6.5–8.1)

## 2020-08-01 LAB — CBC WITH DIFFERENTIAL (CANCER CENTER ONLY)
Abs Immature Granulocytes: 0.02 10*3/uL (ref 0.00–0.07)
Basophils Absolute: 0.1 10*3/uL (ref 0.0–0.1)
Basophils Relative: 1 %
Eosinophils Absolute: 0.1 10*3/uL (ref 0.0–0.5)
Eosinophils Relative: 2 %
HCT: 40.1 % (ref 39.0–52.0)
Hemoglobin: 14.1 g/dL (ref 13.0–17.0)
Immature Granulocytes: 0 %
Lymphocytes Relative: 18 %
Lymphs Abs: 0.9 10*3/uL (ref 0.7–4.0)
MCH: 34.3 pg — ABNORMAL HIGH (ref 26.0–34.0)
MCHC: 35.2 g/dL (ref 30.0–36.0)
MCV: 97.6 fL (ref 80.0–100.0)
Monocytes Absolute: 0.4 10*3/uL (ref 0.1–1.0)
Monocytes Relative: 9 %
Neutro Abs: 3.5 10*3/uL (ref 1.7–7.7)
Neutrophils Relative %: 70 %
Platelet Count: 177 10*3/uL (ref 150–400)
RBC: 4.11 MIL/uL — ABNORMAL LOW (ref 4.22–5.81)
RDW: 11.7 % (ref 11.5–15.5)
WBC Count: 5 10*3/uL (ref 4.0–10.5)
nRBC: 0 % (ref 0.0–0.2)

## 2020-08-01 LAB — LACTATE DEHYDROGENASE: LDH: 162 U/L (ref 98–192)

## 2020-08-01 NOTE — Progress Notes (Signed)
[place] [phone]   Fax:([phone]) [phone]  PROGRESS NOTE  Patient Care Team: Percell Belt, DO as PCP - General (Family Medicine) Josue Hector, MD as PCP - Cardiology (Cardiology)  Hematological/Oncological History #IgG Kappa Monoclonal Gammopathy of Undetermined Significance 1) 03/07/2020: Multiple Myeloma Panel revealed M potein 0.5 with IFE showing IgG monoclonal protein with kappa light chain specificity. 2) 04/12/2020: Establish care with Dede Query PA-C  Interval History:  Caleb [name] 70 y.o. male with medical history significant for IgG Kappa MGUS who presents for a follow up visit. The patient's last visit was on 04/13/2020 at which time he established care. In the interim since the last visit he completed his metastatic survey which showed no evidence of lytic lesions.  Caleb [name] is accompanied by his daughter.  He reports that "everything is the same".  He notes he is not having any issues with bleeding, bruising, or dark stools.  He denies any nausea, vomiting, or diarrhea.  He has had no new bone or back pain.  He otherwise reports that he feels well with no new symptoms.  A full 10 point ROS is listed below.  The bulk of our discussion today focused on the diagnosis of MGUS his current labs and the treatment plan moving forward.  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   LIMA to LAD, RIMA to RCA, SVG to OM1, EVH via right thigh   Tobacco abuse     SURGICAL HISTORY: Past Surgical History:  Procedure Laterality Date   BIOPSY  10/31/2018   Procedure: BIOPSY;  Surgeon: Carol Ada, MD;  Location: [facility];  Service: Endoscopy;;   cataract surgery  Bilateral 12-2017 and 01-2018    COLONOSCOPY WITH PROPOFOL N/A 10/31/2018   Procedure: COLONOSCOPY WITH PROPOFOL;  Surgeon: Carol Ada, MD;  Location: [facility];  Service: Endoscopy;  Laterality: N/A;   COLONOSCOPY WITH PROPOFOL N/A 09/18/2019   Procedure: COLONOSCOPY WITH PROPOFOL;  Surgeon: Carol Ada, MD;  Location: [facility];  Service: Endoscopy;  Laterality: N/A;   CORONARY ARTERY BYPASS GRAFT N/A 02/17/2014   Procedure: CORONARY ARTERY BYPASS GRAFTING (CABG);  Surgeon: Rexene Alberts, MD;  Location: [place];  Service: Open Heart Surgery;  Laterality: N/A;  Times 3 using bilateral mammary arteries and endoscopically harvested right saphenous vein   ESOPHAGOGASTRODUODENOSCOPY (EGD) WITH PROPOFOL N/A 10/31/2018   Procedure: ESOPHAGOGASTRODUODENOSCOPY (EGD) WITH PROPOFOL;  Surgeon: Carol Ada, MD;  Location: [facility];  Service: Endoscopy;  Laterality: N/A;   ESOPHAGOGASTRODUODENOSCOPY (EGD) WITH PROPOFOL N/A 09/18/2019   Procedure: ESOPHAGOGASTRODUODENOSCOPY (EGD) WITH PROPOFOL;  Surgeon: Carol Ada, MD;  Location: [facility];  Service: Endoscopy;  Laterality: N/A;   FEMORAL ARTERY - FEMORAL ARTERY BYPASS GRAFT      right femoral artery to below-knee popliteal artery bypass with PTFE and right first ray amputation 04/25/17   HEMOSTASIS CLIP PLACEMENT  10/31/2018   Procedure: HEMOSTASIS CLIP PLACEMENT;  Surgeon: Carol Ada, MD;  Location: [facility];  Service: Endoscopy;;   HEMOSTASIS CLIP PLACEMENT  09/18/2019   Procedure: HEMOSTASIS CLIP PLACEMENT;  Surgeon: Carol Ada, MD;  Location: [facility];  Service: Endoscopy;;   INTRAOPERATIVE TRANSESOPHAGEAL ECHOCARDIOGRAM N/A 02/17/2014   Procedure: INTRAOPERATIVE TRANSESOPHAGEAL ECHOCARDIOGRAM;  Surgeon: Rexene Alberts,  MD;  Location: [facility];  Service: Open Heart Surgery;  Laterality: N/A;   LEFT HEART CATHETERIZATION WITH CORONARY ANGIOGRAM N/A 02/12/2014   Procedure: LEFT HEART CATHETERIZATION WITH CORONARY ANGIOGRAM;  Surgeon: Sinclair Grooms, MD;   [facility];  Service: Cardiovascular;  Laterality: N/A;   POLYPECTOMY  10/31/2018   Procedure: POLYPECTOMY;  Surgeon: Carol Ada, MD;  Location: [facility];  Service: Endoscopy;;   POLYPECTOMY  09/18/2019   Procedure: POLYPECTOMY;  Surgeon: Carol Ada, MD;  Location: [facility];  Service: Endoscopy;;   SCLEROTHERAPY  09/18/2019   Procedure: Clide Deutscher;  Surgeon: Carol Ada, MD;  Location: [facility];  Service: Endoscopy;;   SUBMUCOSAL INJECTION  09/18/2019   Procedure: SUBMUCOSAL INJECTION;  Surgeon: Carol Ada, MD;  Location: [facility];  Service: Endoscopy;;   SUBMUCOSAL LIFTING INJECTION  10/31/2018   Procedure: SUBMUCOSAL LIFTING INJECTION;  Surgeon: Carol Ada, MD;  Location: [facility];  Service: Endoscopy;;   TOE AMPUTATION Right    right great toe     SOCIAL HISTORY: Social History   Socioeconomic History   Marital status: Single    Spouse name: Not on file   Number of children: Not on file   Years of education: Not on file   Highest education level: Not on file  Occupational History   Occupation: metal work  Tobacco Use   Smoking status: Every Day    Packs/day: 0.50    Years: 35.00    Pack years: 17.50    Types: Cigarettes    Last attempt to quit: 02/11/2014    Years since quitting: 6.4   Smokeless tobacco: Never   Tobacco comments:    has quit twice before   Vaping Use   Vaping Use: Never used  Substance and Sexual Activity   Alcohol use: Yes    Alcohol/week: 0.0 standard drinks    Comment: Six pack a day of beer   Drug use: Never   Sexual activity: Not on file  Other Topics Concern   Not on file  Social History Narrative   Not on file   Social Determinants of Health   Financial Resource Strain: Not on file  Food Insecurity: Not on file  Transportation Needs: Not on file  Physical Activity: Not on file  Stress: Not on file  Social Connections: Not on file  Intimate Partner Violence: Not on file    FAMILY  HISTORY: Family History  Problem Relation Age of Onset   Heart attack Mother    Diabetes Mother    Heart attack Brother    Diabetes Brother    Cancer Sister    Stroke Neg Hx     ALLERGIES:  is allergic to sulfamethoxazole-trimethoprim, cilostazol, lisinopril, and losartan.  MEDICATIONS:  Current Outpatient Medications  Medication Sig Dispense Refill   furosemide (LASIX) 20 MG tablet Take 1 tablet by mouth every other day.     pantoprazole (PROTONIX) 40 MG tablet Take 1 tablet by mouth daily.     sodium chloride 1 g tablet Take by mouth.     acetaminophen (TYLENOL) 500 MG tablet Take 1,000 mg by mouth 2 (two) times daily as needed for mild pain or moderate pain.      albuterol (VENTOLIN HFA) 108 (90 Base) MCG/ACT inhaler Inhale 2 puffs into the lungs every 6 (six) hours as needed for wheezing or shortness of breath. 18 g 3   amLODipine (NORVASC) 5 MG tablet Take 1 tablet (5 mg total) by mouth daily. 30 tablet 0  atorvastatin (LIPITOR) 80 MG tablet Take 1 tablet (80 mg total) by mouth daily. 90 tablet 3   carvedilol (COREG) 25 MG tablet Take 25 mg by mouth 2 (two) times daily with a meal.     Cholecalciferol (VITAMIN D-3 PO) Take 2 capsules by mouth daily with breakfast.      denosumab (PROLIA) 60 MG/ML SOSY injection Inject 1 mL into the skin every 6 (six) months.     ferrous sulfate 325 (65 FE) MG tablet Take 1 tablet (325 mg total) by mouth daily with breakfast. 30 tablet 0   gabapentin (NEURONTIN) 300 MG capsule Take 300 mg by mouth 3 (three) times daily.     irbesartan (AVAPRO) 300 MG tablet Take 1 tablet (300 mg total) by mouth daily. 30 tablet 0   nitroGLYCERIN (NITROSTAT) 0.4 MG SL tablet Place 1 tablet (0.4 mg total) under the tongue every 5 (five) minutes as needed for chest pain. 25 tablet 3   Olopatadine HCl (PATADAY OP) Place 1 drop into both eyes daily as needed (allergies).     rivaroxaban (XARELTO) 2.5 MG TABS tablet Take 1 tablet (2.5 mg total) by mouth 2 (two) times  daily. 60 tablet 0   THIAMINE HCL PO Take by mouth daily.     vitamin B-12 (CYANOCOBALAMIN) 1000 MCG tablet Take 1,000 mcg by mouth daily with breakfast.     No current facility-administered medications for this visit.    REVIEW OF SYSTEMS:   Constitutional: ( - ) fevers, ( - )  chills , ( - ) night sweats Eyes: ( - ) blurriness of vision, ( - ) double vision, ( - ) watery eyes Ears, nose, mouth, throat, and face: ( - ) mucositis, ( - ) sore throat Respiratory: ( - ) cough, ( - ) dyspnea, ( - ) wheezes Cardiovascular: ( - ) palpitation, ( - ) chest discomfort, ( - ) lower extremity swelling Gastrointestinal:  ( - ) nausea, ( - ) heartburn, ( - ) change in bowel habits Skin: ( - ) abnormal skin rashes Lymphatics: ( - ) new lymphadenopathy, ( - ) easy bruising Neurological: ( - ) numbness, ( - ) tingling, ( - ) new weaknesses Behavioral/Psych: ( - ) mood change, ( - ) new changes  All other systems were reviewed with the patient and are negative.  PHYSICAL EXAMINATION: ECOG PERFORMANCE STATUS: 0 - Asymptomatic  Vitals:   08/01/20 1048  BP: (!) 155/60  Pulse: 62  Resp: 17  Temp: 98.2 F (36.8 C)  SpO2: 100%   Filed Weights   08/01/20 1048  Weight: 148 lb 8 oz (67.4 kg)    GENERAL: Well-appearing elderly male, alert, no distress and comfortable SKIN: skin color, texture, turgor are normal, no rashes or significant lesions EYES: conjunctiva are pink and non-injected, sclera clear LUNGS: clear to auscultation and percussion with normal breathing effort HEART: regular rate & rhythm and no murmurs and no lower extremity edema PSYCH: alert & oriented x 3, fluent speech NEURO: no focal motor/sensory deficits  LABORATORY DATA:  I have reviewed the data as listed CBC Latest Ref Rng & Units 08/01/2020 04/13/2020 08/12/2019  WBC 4.0 - 10.5 K/uL 5.0 6.7 -  Hemoglobin 13.0 - 17.0 g/dL 14.1 14.2 9.5(L)  Hematocrit 39.0 - 52.0 % 40.1 40.4 30.3(L)  Platelets 150 - 400 K/uL 177 170 -     CMP Latest Ref Rng & Units 08/01/2020 04/13/2020 03/07/2020  Glucose 70 - 99 mg/dL 89 110(H) -  BUN 8 -  23 mg/dL 7(L) 10 -  Creatinine 0.61 - 1.24 mg/dL 0.79 0.82 -  Sodium 135 - 145 mmol/L 131(L) 132(L) -  Potassium 3.5 - 5.1 mmol/L 4.4 4.6 -  Chloride 98 - 111 mmol/L 97(L) 94(L) -  CO2 22 - 32 mmol/L 23 25 -  Calcium 8.9 - 10.3 mg/dL 9.6 9.7 -  Total Protein 6.5 - 8.1 g/dL 7.9 7.7 7.1  Total Bilirubin 0.3 - 1.2 mg/dL 0.5 0.6 -  Alkaline Phos 38 - 126 U/L 71 65 -  AST 15 - 41 U/L 16 19 -  ALT 0 - 44 U/L 8 10 -    Lab Results  Component Value Date   MPROTEIN 0.5 (H) 03/07/2020   Lab Results  Component Value Date   KPAFRELGTCHN 30.4 (H) 04/13/2020   LAMBDASER 22.8 04/13/2020   KAPLAMBRATIO 4.90 04/20/2020   KAPLAMBRATIO 1.33 04/13/2020    RADIOGRAPHIC STUDIES: No results found.  ASSESSMENT & PLAN Caleb [name] 70 y.o. male with medical history significant for IgG Kappa MGUS who presents for a follow up visit.   #IgG Kappa Monoclonal Gammopathy of Undetermined Significance --SPEP with IFE from 03/07/20 showed M-spike of 0.5, IgG monoclonal protein with kappa light chain specificity. K/L ratio is within normal limits with no CRAB criteria.  --today will repeat CBC w/ diff, CMP, LDH, kappa/lambda light chains --Mild increase in protein in the urine.  We will repeat UPEP on a yearly basis as well. --DG bone met survey showed no lytic lesions.  Repeat this on a yearly basis -- No indication for a bone marrow biopsy based on above results. --RTC in 6 months with labs unless further workup is needed.   No orders of the defined types were placed in this encounter.   All questions were answered. The patient knows to call the clinic with any problems, questions or concerns.  A total of more than 30 minutes were spent on this encounter with face-to-face time and non-face-to-face time, including preparing to see the patient, ordering tests and/or medications, counseling the patient  and coordination of care as outlined above.   Ledell Peoples, MD Department of Hematology/Oncology [place] [facility] Phone: [phone] Pager: [phone] Email: [email or web address]  08/01/2020 12:41 PM

## 2020-08-02 LAB — KAPPA/LAMBDA LIGHT CHAINS
Kappa free light chain: 30.6 mg/L — ABNORMAL HIGH (ref 3.3–19.4)
Kappa, lambda light chain ratio: 1.31 (ref 0.26–1.65)
Lambda free light chains: 23.4 mg/L (ref 5.7–26.3)

## 2020-08-02 LAB — BETA 2 MICROGLOBULIN, SERUM: Beta-2 Microglobulin: 2.3 mg/L (ref 0.6–2.4)

## 2020-08-03 ENCOUNTER — Telehealth: Payer: Self-pay | Admitting: Hematology and Oncology

## 2020-08-03 NOTE — Telephone Encounter (Signed)
Scheduled per los. Called and spoke with patients daughter. Confirmed appt  °

## 2020-08-08 LAB — MULTIPLE MYELOMA PANEL, SERUM
Albumin SerPl Elph-Mcnc: 3.8 g/dL (ref 2.9–4.4)
Albumin/Glob SerPl: 1.2 (ref 0.7–1.7)
Alpha 1: 0.2 g/dL (ref 0.0–0.4)
Alpha2 Glob SerPl Elph-Mcnc: 0.8 g/dL (ref 0.4–1.0)
B-Globulin SerPl Elph-Mcnc: 1.2 g/dL (ref 0.7–1.3)
Gamma Glob SerPl Elph-Mcnc: 1 g/dL (ref 0.4–1.8)
Globulin, Total: 3.3 g/dL (ref 2.2–3.9)
IgA: 477 mg/dL — ABNORMAL HIGH (ref 61–437)
IgG (Immunoglobin G), Serum: 1090 mg/dL (ref 603–1613)
IgM (Immunoglobulin M), Srm: 60 mg/dL (ref 15–143)
M Protein SerPl Elph-Mcnc: 0.5 g/dL — ABNORMAL HIGH
Total Protein ELP: 7.1 g/dL (ref 6.0–8.5)

## 2020-09-04 ENCOUNTER — Observation Stay (HOSPITAL_COMMUNITY): Payer: Medicare HMO

## 2020-09-04 ENCOUNTER — Encounter (HOSPITAL_COMMUNITY): Payer: Self-pay | Admitting: Emergency Medicine

## 2020-09-04 ENCOUNTER — Other Ambulatory Visit: Payer: Self-pay

## 2020-09-04 ENCOUNTER — Inpatient Hospital Stay (HOSPITAL_COMMUNITY)
Admission: EM | Admit: 2020-09-04 | Discharge: 2020-09-09 | DRG: 640 | Disposition: A | Discharge status: Skilled Nursing Facility | Payer: Medicare HMO | Attending: Internal Medicine | Admitting: Internal Medicine

## 2020-09-04 ENCOUNTER — Emergency Department (HOSPITAL_COMMUNITY): Payer: Medicare HMO

## 2020-09-04 DIAGNOSIS — Z7901 Long term (current) use of anticoagulants: Secondary | ICD-10-CM

## 2020-09-04 DIAGNOSIS — I70201 Unspecified atherosclerosis of native arteries of extremities, right leg: Secondary | ICD-10-CM | POA: Diagnosis present

## 2020-09-04 DIAGNOSIS — Z20822 Contact with and (suspected) exposure to covid-19: Secondary | ICD-10-CM | POA: Diagnosis present

## 2020-09-04 DIAGNOSIS — Z23 Encounter for immunization: Secondary | ICD-10-CM

## 2020-09-04 DIAGNOSIS — J449 Chronic obstructive pulmonary disease, unspecified: Secondary | ICD-10-CM | POA: Diagnosis present

## 2020-09-04 DIAGNOSIS — I5042 Chronic combined systolic (congestive) and diastolic (congestive) heart failure: Secondary | ICD-10-CM | POA: Diagnosis present

## 2020-09-04 DIAGNOSIS — Z79899 Other long term (current) drug therapy: Secondary | ICD-10-CM

## 2020-09-04 DIAGNOSIS — S90121A Contusion of right lesser toe(s) without damage to nail, initial encounter: Secondary | ICD-10-CM | POA: Diagnosis present

## 2020-09-04 DIAGNOSIS — Z888 Allergy status to other drugs, medicaments and biological substances status: Secondary | ICD-10-CM

## 2020-09-04 DIAGNOSIS — I10 Essential (primary) hypertension: Secondary | ICD-10-CM | POA: Diagnosis present

## 2020-09-04 DIAGNOSIS — R296 Repeated falls: Secondary | ICD-10-CM | POA: Diagnosis present

## 2020-09-04 DIAGNOSIS — F1020 Alcohol dependence, uncomplicated: Secondary | ICD-10-CM | POA: Diagnosis present

## 2020-09-04 DIAGNOSIS — W228XXA Striking against or struck by other objects, initial encounter: Secondary | ICD-10-CM | POA: Diagnosis present

## 2020-09-04 DIAGNOSIS — T501X5A Adverse effect of loop [high-ceiling] diuretics, initial encounter: Secondary | ICD-10-CM | POA: Diagnosis present

## 2020-09-04 DIAGNOSIS — E440 Moderate protein-calorie malnutrition: Secondary | ICD-10-CM | POA: Insufficient documentation

## 2020-09-04 DIAGNOSIS — Z882 Allergy status to sulfonamides status: Secondary | ICD-10-CM

## 2020-09-04 DIAGNOSIS — E222 Syndrome of inappropriate secretion of antidiuretic hormone: Secondary | ICD-10-CM

## 2020-09-04 DIAGNOSIS — S91114A Laceration without foreign body of right lesser toe(s) without damage to nail, initial encounter: Secondary | ICD-10-CM | POA: Diagnosis present

## 2020-09-04 DIAGNOSIS — Z8249 Family history of ischemic heart disease and other diseases of the circulatory system: Secondary | ICD-10-CM

## 2020-09-04 DIAGNOSIS — S90229A Contusion of unspecified lesser toe(s) with damage to nail, initial encounter: Secondary | ICD-10-CM

## 2020-09-04 DIAGNOSIS — E876 Hypokalemia: Secondary | ICD-10-CM | POA: Diagnosis present

## 2020-09-04 DIAGNOSIS — Z833 Family history of diabetes mellitus: Secondary | ICD-10-CM

## 2020-09-04 DIAGNOSIS — E871 Hypo-osmolality and hyponatremia: Secondary | ICD-10-CM | POA: Diagnosis not present

## 2020-09-04 DIAGNOSIS — Z951 Presence of aortocoronary bypass graft: Secondary | ICD-10-CM

## 2020-09-04 DIAGNOSIS — I951 Orthostatic hypotension: Secondary | ICD-10-CM | POA: Diagnosis present

## 2020-09-04 DIAGNOSIS — G9341 Metabolic encephalopathy: Secondary | ICD-10-CM | POA: Diagnosis present

## 2020-09-04 DIAGNOSIS — L89624 Pressure ulcer of left heel, stage 4: Secondary | ICD-10-CM | POA: Diagnosis present

## 2020-09-04 DIAGNOSIS — Z95820 Peripheral vascular angioplasty status with implants and grafts: Secondary | ICD-10-CM

## 2020-09-04 DIAGNOSIS — S91119A Laceration without foreign body of unspecified toe without damage to nail, initial encounter: Secondary | ICD-10-CM

## 2020-09-04 DIAGNOSIS — E7800 Pure hypercholesterolemia, unspecified: Secondary | ICD-10-CM | POA: Diagnosis present

## 2020-09-04 DIAGNOSIS — I2510 Atherosclerotic heart disease of native coronary artery without angina pectoris: Secondary | ICD-10-CM | POA: Diagnosis present

## 2020-09-04 DIAGNOSIS — F17210 Nicotine dependence, cigarettes, uncomplicated: Secondary | ICD-10-CM | POA: Diagnosis present

## 2020-09-04 DIAGNOSIS — I110 Hypertensive heart disease with heart failure: Secondary | ICD-10-CM | POA: Diagnosis present

## 2020-09-04 DIAGNOSIS — Z6824 Body mass index (BMI) 24.0-24.9, adult: Secondary | ICD-10-CM

## 2020-09-04 DIAGNOSIS — R509 Fever, unspecified: Secondary | ICD-10-CM

## 2020-09-04 LAB — CBC WITH DIFFERENTIAL/PLATELET
Abs Immature Granulocytes: 0.04 10*3/uL (ref 0.00–0.07)
Basophils Absolute: 0 10*3/uL (ref 0.0–0.1)
Basophils Relative: 0 %
Eosinophils Absolute: 0 10*3/uL (ref 0.0–0.5)
Eosinophils Relative: 0 %
HCT: 36.8 % — ABNORMAL LOW (ref 39.0–52.0)
Hemoglobin: 13.8 g/dL (ref 13.0–17.0)
Immature Granulocytes: 0 %
Lymphocytes Relative: 5 %
Lymphs Abs: 0.5 10*3/uL — ABNORMAL LOW (ref 0.7–4.0)
MCH: 33.7 pg (ref 26.0–34.0)
MCHC: 37.5 g/dL — ABNORMAL HIGH (ref 30.0–36.0)
MCV: 90 fL (ref 80.0–100.0)
Monocytes Absolute: 0.8 10*3/uL (ref 0.1–1.0)
Monocytes Relative: 9 %
Neutro Abs: 7.9 10*3/uL — ABNORMAL HIGH (ref 1.7–7.7)
Neutrophils Relative %: 86 %
Platelets: 155 10*3/uL (ref 150–400)
RBC: 4.09 MIL/uL — ABNORMAL LOW (ref 4.22–5.81)
RDW: 10.9 % — ABNORMAL LOW (ref 11.5–15.5)
WBC: 9.3 10*3/uL (ref 4.0–10.5)
nRBC: 0 % (ref 0.0–0.2)

## 2020-09-04 LAB — BASIC METABOLIC PANEL
Anion gap: 9 (ref 5–15)
BUN: <5 mg/dL — ABNORMAL LOW (ref 8–23)
CO2: 25 mmol/L (ref 22–32)
Calcium: 9 mg/dL (ref 8.9–10.3)
Chloride: 82 mmol/L — ABNORMAL LOW (ref 98–111)
Creatinine, Ser: 0.62 mg/dL (ref 0.61–1.24)
GFR, Estimated: >60 mL/min (ref >60)
Glucose, Bld: 130 mg/dL — ABNORMAL HIGH (ref 70–99)
Potassium: 3 mmol/L — ABNORMAL LOW (ref 3.5–5.1)
Sodium: 116 mmol/L — CL (ref 135–145)

## 2020-09-04 LAB — RENAL FUNCTION PANEL
Albumin: 3.8 g/dL (ref 3.5–5.0)
Anion gap: 11 (ref 5–15)
BUN: 5 mg/dL — ABNORMAL LOW (ref 8–23)
CO2: 25 mmol/L (ref 22–32)
Calcium: 9.1 mg/dL (ref 8.9–10.3)
Chloride: 81 mmol/L — ABNORMAL LOW (ref 98–111)
Creatinine, Ser: 0.62 mg/dL (ref 0.61–1.24)
GFR, Estimated: >60 mL/min (ref >60)
Glucose, Bld: 130 mg/dL — ABNORMAL HIGH (ref 70–99)
Phosphorus: 2.2 mg/dL — ABNORMAL LOW (ref 2.5–4.6)
Potassium: 3.1 mmol/L — ABNORMAL LOW (ref 3.5–5.1)
Sodium: 117 mmol/L — CL (ref 135–145)

## 2020-09-04 LAB — OSMOLALITY, URINE: Osmolality, Ur: 241 mOsm/kg — ABNORMAL LOW (ref 300–900)

## 2020-09-04 LAB — OSMOLALITY: Osmolality: 245 mOsm/kg — CL (ref 275–295)

## 2020-09-04 MED ORDER — ATORVASTATIN CALCIUM 80 MG PO TABS
80.0000 mg | ORAL_TABLET | Freq: Every day | ORAL | Status: DC
  Administered 2020-09-05 – 2020-09-09 (×5): 80 mg via ORAL
  Filled 2020-09-04 (×5): qty 1

## 2020-09-04 MED ORDER — VITAMIN D 25 MCG (1000 UNIT) PO TABS
1000.0000 [IU] | ORAL_TABLET | Freq: Every day | ORAL | Status: DC
  Administered 2020-09-05 – 2020-09-09 (×5): 1000 [IU] via ORAL
  Filled 2020-09-04 (×5): qty 1

## 2020-09-04 MED ORDER — SODIUM CHLORIDE 0.9 % IV SOLN: INTRAVENOUS | Status: DC

## 2020-09-04 MED ORDER — ALBUTEROL SULFATE (2.5 MG/3ML) 0.083% IN NEBU: 2.5000 mg | INHALATION_SOLUTION | Freq: Four times a day (QID) | RESPIRATORY_TRACT | Status: DC | PRN

## 2020-09-04 MED ORDER — GABAPENTIN 300 MG PO CAPS
300.0000 mg | ORAL_CAPSULE | Freq: Three times a day (TID) | ORAL | Status: DC
  Administered 2020-09-04 – 2020-09-09 (×15): 300 mg via ORAL
  Filled 2020-09-04 (×15): qty 1

## 2020-09-04 MED ORDER — ACETAMINOPHEN 650 MG RE SUPP: 650.0000 mg | Freq: Four times a day (QID) | RECTAL | Status: DC | PRN

## 2020-09-04 MED ORDER — FERROUS SULFATE 325 (65 FE) MG PO TABS
325.0000 mg | ORAL_TABLET | Freq: Every day | ORAL | Status: DC
  Administered 2020-09-05 – 2020-09-09 (×5): 325 mg via ORAL
  Filled 2020-09-04 (×5): qty 1

## 2020-09-04 MED ORDER — ACETAMINOPHEN 325 MG PO TABS
650.0000 mg | ORAL_TABLET | Freq: Four times a day (QID) | ORAL | Status: DC | PRN
  Administered 2020-09-05: 650 mg via ORAL
  Filled 2020-09-04: qty 2

## 2020-09-04 MED ORDER — CARVEDILOL 25 MG PO TABS
25.0000 mg | ORAL_TABLET | Freq: Two times a day (BID) | ORAL | Status: DC
  Administered 2020-09-05 – 2020-09-09 (×10): 25 mg via ORAL
  Filled 2020-09-04 (×10): qty 1

## 2020-09-04 MED ORDER — AMLODIPINE BESYLATE 5 MG PO TABS
5.0000 mg | ORAL_TABLET | Freq: Every day | ORAL | Status: DC
  Administered 2020-09-05 – 2020-09-08 (×4): 5 mg via ORAL
  Filled 2020-09-04 (×4): qty 1

## 2020-09-04 MED ORDER — RIVAROXABAN 2.5 MG PO TABS
2.5000 mg | ORAL_TABLET | Freq: Two times a day (BID) | ORAL | Status: DC
  Administered 2020-09-04 – 2020-09-09 (×10): 2.5 mg via ORAL
  Filled 2020-09-04 (×11): qty 1

## 2020-09-04 MED ORDER — IRBESARTAN 150 MG PO TABS
300.0000 mg | ORAL_TABLET | Freq: Every day | ORAL | Status: DC
  Administered 2020-09-05 – 2020-09-09 (×5): 300 mg via ORAL
  Filled 2020-09-04 (×5): qty 2

## 2020-09-04 MED ORDER — ASPIRIN EC 81 MG PO TBEC
81.0000 mg | DELAYED_RELEASE_TABLET | Freq: Every day | ORAL | Status: DC
  Administered 2020-09-05 – 2020-09-09 (×5): 81 mg via ORAL
  Filled 2020-09-04 (×5): qty 1

## 2020-09-04 MED ORDER — TETANUS-DIPHTH-ACELL PERTUSSIS 5-2.5-18.5 LF-MCG/0.5 IM SUSY
0.5000 mL | PREFILLED_SYRINGE | Freq: Once | INTRAMUSCULAR | Status: AC
  Administered 2020-09-04: 0.5 mL via INTRAMUSCULAR
  Filled 2020-09-04: qty 0.5

## 2020-09-04 MED ORDER — NITROGLYCERIN 0.4 MG SL SUBL: 0.4000 mg | SUBLINGUAL_TABLET | SUBLINGUAL | Status: DC | PRN

## 2020-09-04 MED ORDER — FOLIC ACID 1 MG PO TABS
1.0000 mg | ORAL_TABLET | Freq: Every day | ORAL | Status: DC
  Administered 2020-09-04 – 2020-09-09 (×6): 1 mg via ORAL
  Filled 2020-09-04 (×6): qty 1

## 2020-09-04 MED ORDER — PANTOPRAZOLE SODIUM 40 MG PO TBEC
40.0000 mg | DELAYED_RELEASE_TABLET | Freq: Every day | ORAL | Status: DC
  Administered 2020-09-05 – 2020-09-09 (×5): 40 mg via ORAL
  Filled 2020-09-04 (×5): qty 1

## 2020-09-04 MED ORDER — SODIUM CHLORIDE 0.9 % IV BOLUS
1000.0000 mL | Freq: Once | INTRAVENOUS | Status: AC
  Administered 2020-09-04: 1000 mL via INTRAVENOUS

## 2020-09-04 MED ORDER — THIAMINE HCL 100 MG PO TABS
100.0000 mg | ORAL_TABLET | Freq: Every day | ORAL | Status: DC
  Administered 2020-09-05 – 2020-09-09 (×5): 100 mg via ORAL
  Filled 2020-09-04 (×5): qty 1

## 2020-09-04 MED ORDER — POTASSIUM CHLORIDE 20 MEQ PO PACK
20.0000 meq | PACK | Freq: Two times a day (BID) | ORAL | Status: AC
  Administered 2020-09-04 – 2020-09-05 (×2): 20 meq via ORAL
  Filled 2020-09-04 (×2): qty 1

## 2020-09-04 MED ORDER — VITAMIN B-12 1000 MCG PO TABS
1000.0000 ug | ORAL_TABLET | Freq: Every day | ORAL | Status: DC
  Administered 2020-09-05 – 2020-09-09 (×5): 1000 ug via ORAL
  Filled 2020-09-04 (×5): qty 1

## 2020-09-04 MED ORDER — CEFAZOLIN SODIUM-DEXTROSE 1-4 GM/50ML-% IV SOLN
1.0000 g | Freq: Once | INTRAVENOUS | Status: AC
  Administered 2020-09-04: 1 g via INTRAVENOUS
  Filled 2020-09-04: qty 50

## 2020-09-04 MED ORDER — DULOXETINE HCL 60 MG PO CPEP
60.0000 mg | ORAL_CAPSULE | Freq: Every day | ORAL | Status: DC
  Administered 2020-09-05 – 2020-09-06 (×2): 60 mg via ORAL
  Filled 2020-09-04 (×2): qty 1

## 2020-09-04 NOTE — ED Notes (Signed)
Attempted to call report to 6E; nurse is in a pt room and requests a return call in a few minutes.

## 2020-09-04 NOTE — ED Notes (Signed)
Consulting provider at bedside

## 2020-09-04 NOTE — ED Notes (Signed)
Applied combat gauze and rolled gauze to right 2nd toe and taped to secure dressing in place. Changed dressing to left posterior heel and applied mepilex dressing with tape to secure in place.

## 2020-09-04 NOTE — ED Notes (Signed)
Pt transported back to room from Xray at this time.

## 2020-09-04 NOTE — ED Notes (Signed)
Informed MD of critical sodium per lab

## 2020-09-04 NOTE — H&P (Addendum)
History and Physical    Caleb [name] [id number] DOB: November 15, 1950 DOA: 09/04/2020  PCP: Percell Belt, DO  Patient coming from: Home.  Chief Complaint: Low sodium and wound on the right great toe.  HPI: Caleb [name] is a 70 y.o. male with history of CAD status post CABG, systolic heart failure last EF measured was in July 2021 45 to 50% with grade 2 diastolic dysfunction, peripheral vascular disease status post femoropopliteal bypass, ongoing tobacco abuse, COPD and alcohol use drinks beer daily was referred to the ER after patient's recent sodium done as outpatient 2 days ago was 114.  Patient has been feeling weak at times confused as per the patient's daughter who provided the history.  Patient also recently hit his leg while taking laundry out of his washing machine.  Also had falls but per report did not hit his head or lose consciousness.  Patient has been on Lasix for congestive heart failure usually takes every other day but about 2 weeks ago it was made every day following which his swelling decreased and on Friday 2 days ago patient's primary care physician advised to go back on every other day.  At the time patient's lab work was done which showed sodium of 114 and was instructed to come to the ER.  Patient has been having ongoing issues with sodium and usually sodium is in between 120 and 130.  Take sodium tablets 4 times daily.  ED Course: In the ER patient is hemodynamically stable appears euvolemic.  Labs show sodium of 116 potassium of 3 repeat 1 was 117 it was after liter of fluid potassium 3.1 serum osmolality was 245 urine osmolality was 241.  CT head unremarkable x-ray of the right foot was negative for fracture and COVID test is pending.  Nephrologist on-call was consulted.  Review of Systems: As per HPI, rest all negative.   Past Medical History:  Diagnosis Date   Acute respiratory failure with hypoxia (HCC)    Acute systolic congestive heart failure ([place]) 02/11/2014    COPD (chronic obstructive pulmonary disease) ([place]) 02/11/2014   Essential hypertension    Hypercholesteremia    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   LIMA to LAD, RIMA to RCA, SVG to OM1, EVH via right thigh   Tobacco abuse     Past Surgical History:  Procedure Laterality Date   BIOPSY  10/31/2018   Procedure: BIOPSY;  Surgeon: Carol Ada, MD;  Location: [facility];  Service: Endoscopy;;   cataract surgery  Bilateral 12-2017 and 01-2018   COLONOSCOPY WITH PROPOFOL N/A 10/31/2018   Procedure: COLONOSCOPY WITH PROPOFOL;  Surgeon: Carol Ada, MD;  Location: [facility];  Service: Endoscopy;  Laterality: N/A;   COLONOSCOPY WITH PROPOFOL N/A 09/18/2019   Procedure: COLONOSCOPY WITH PROPOFOL;  Surgeon: Carol Ada, MD;  Location: [facility];  Service: Endoscopy;  Laterality: N/A;   CORONARY ARTERY BYPASS GRAFT N/A 02/17/2014   Procedure: CORONARY ARTERY BYPASS GRAFTING (CABG);  Surgeon: Rexene Alberts, MD;  Location: [facility];  Service: Open Heart Surgery;  Laterality: N/A;  Times 3 using bilateral mammary arteries and endoscopically harvested right saphenous vein   ESOPHAGOGASTRODUODENOSCOPY (EGD) WITH PROPOFOL N/A 10/31/2018   Procedure: ESOPHAGOGASTRODUODENOSCOPY (EGD) WITH PROPOFOL;  Surgeon: Carol Ada, MD;  Location: [facility];  Service: Endoscopy;  Laterality: N/A;   ESOPHAGOGASTRODUODENOSCOPY (EGD) WITH PROPOFOL N/A 09/18/2019   Procedure: ESOPHAGOGASTRODUODENOSCOPY (EGD) WITH PROPOFOL;  Surgeon: Carol Ada, MD;  Location: [facility];  Service: Endoscopy;  Laterality: N/A;  FEMORAL ARTERY - FEMORAL ARTERY BYPASS GRAFT      right femoral artery to below-knee popliteal artery bypass with PTFE and right first ray amputation 04/25/17   HEMOSTASIS CLIP PLACEMENT  10/31/2018   Procedure: HEMOSTASIS CLIP PLACEMENT;  Surgeon: Carol Ada, MD;  Location: [facility];  Service: Endoscopy;;   HEMOSTASIS CLIP PLACEMENT  09/18/2019   Procedure: HEMOSTASIS CLIP PLACEMENT;  Surgeon: Carol Ada, MD;  Location: [facility];  Service: Endoscopy;;   INTRAOPERATIVE TRANSESOPHAGEAL ECHOCARDIOGRAM N/A 02/17/2014   Procedure: INTRAOPERATIVE TRANSESOPHAGEAL ECHOCARDIOGRAM;  Surgeon: Rexene Alberts, MD;  Location: [place];  Service: Open Heart Surgery;  Laterality: N/A;   LEFT HEART CATHETERIZATION WITH CORONARY ANGIOGRAM N/A 02/12/2014   Procedure: LEFT HEART CATHETERIZATION WITH CORONARY ANGIOGRAM;  Surgeon: Sinclair Grooms, MD;  [facility];  Service: Cardiovascular;  Laterality: N/A;   POLYPECTOMY  10/31/2018   Procedure: POLYPECTOMY;  Surgeon: Carol Ada, MD;  [facility];  Service: Endoscopy;;   POLYPECTOMY  09/18/2019   Procedure: POLYPECTOMY;  Surgeon: Carol Ada, MD;  [facility];  Service: Endoscopy;;   SCLEROTHERAPY  09/18/2019   Procedure: Clide Deutscher;  Surgeon: Carol Ada, MD;  Location[facility];  Service: Endoscopy;;   SUBMUCOSAL INJECTION  09/18/2019   Procedure: SUBMUCOSAL INJECTION;  Surgeon: Carol Ada, MD;  [facility];  Service: Endoscopy;;   SUBMUCOSAL LIFTING INJECTION  10/31/2018   Procedure: SUBMUCOSAL LIFTING INJECTION;  Surgeon: Carol Ada, MD;  Location: [facility];  Service: Endoscopy;;   TOE AMPUTATION Right    right great toe      reports that he has been smoking cigarettes. He has a 17.50 pack-year smoking history. He has never used smokeless tobacco. He reports current alcohol use. He reports that he does not use drugs.  Allergies  Allergen Reactions   Sulfamethoxazole-Trimethoprim Other (See Comments)    "Made potassium go high and sodium go low"   Cilostazol Swelling and Other (See Comments)    Site of swelling not recalled by the patient   Lisinopril Cough and Other (See Comments)   Losartan Swelling    Pt's daughter stated it made pt's blood pressure and sodium too low, but pt has been tolerating irbesartan.    Family History  Problem Relation Age of Onset   Heart attack Mother     Diabetes Mother    Heart attack Brother    Diabetes Brother    Cancer Sister    Stroke Neg Hx     Prior to Admission medications   Medication Sig Start Date End Date Taking? Authorizing Provider  acetaminophen (TYLENOL) 500 MG tablet Take 1,000 mg by mouth 2 (two) times daily as needed for mild pain or moderate pain.    Yes [provider]  albuterol (VENTOLIN HFA) 108 (90 Base) MCG/ACT inhaler Inhale 2 puffs into the lungs every 6 (six) hours as needed for wheezing or shortness of breath. 07/24/18  Yes Olalere, Adewale A, MD  amLODipine (NORVASC) 5 MG tablet Take 1 tablet (5 mg total) by mouth daily. 08/17/19 09/04/20 Yes Dahal, Marlowe Aschoff, MD  aspirin EC 81 MG tablet Take 81 mg by mouth daily. Swallow whole.   Yes [provider]  atorvastatin (LIPITOR) 80 MG tablet Take 1 tablet (80 mg total) by mouth daily. 12/14/15  Yes Josue Hector, MD  carvedilol (COREG) 25 MG tablet Take 25 mg by mouth 2 (two) times daily with a meal.   Yes [provider]  Cholecalciferol (VITAMIN D-3 PO)  Take 1,000 Units by mouth daily with breakfast.   Yes [provider]  denosumab (PROLIA) 60 MG/ML SOSY injection Inject 1 mL into the skin every 6 (six) months. 01/20/20  Yes [provider]  DULoxetine (CYMBALTA) 30 MG capsule Take 60 mg by mouth daily. 08/08/20  Yes [provider]  ferrous sulfate 325 (65 FE) MG tablet Take 1 tablet (325 mg total) by mouth daily with breakfast. 08/17/19 09/04/20 Yes Dahal, Marlowe Aschoff, MD  folic acid (FOLVITE) 1 MG tablet Take 1 mg by mouth daily. 08/01/20  Yes [provider]  furosemide (LASIX) 20 MG tablet Take 20 mg by mouth every other day. 04/16/20  Yes [provider]  gabapentin (NEURONTIN) 300 MG capsule Take 300 mg by mouth 3 (three) times daily.   Yes [provider]  irbesartan (AVAPRO) 300 MG tablet Take 1 tablet (300 mg total) by mouth daily. 08/17/19 09/04/20 Yes Dahal, Marlowe Aschoff, MD  nitroGLYCERIN  (NITROSTAT) 0.4 MG SL tablet Place 1 tablet (0.4 mg total) under the tongue every 5 (five) minutes as needed for chest pain. 02/03/19 09/04/20 Yes Kilroy, Luke K, PA-C  Olopatadine HCl (PATADAY OP) Place 1 drop into both eyes daily as needed (allergies).   Yes [provider]  pantoprazole (PROTONIX) 40 MG tablet Take 40 mg by mouth daily. 04/16/20  Yes [provider]  rivaroxaban (XARELTO) 2.5 MG TABS tablet Take 1 tablet (2.5 mg total) by mouth 2 (two) times daily. 08/16/19 09/04/20 Yes Dahal, Marlowe Aschoff, MD  thiamine 100 MG tablet Take 100 mg by mouth daily.   Yes [provider]  traMADol (ULTRAM) 50 MG tablet Take 50 mg by mouth every 6 (six) hours as needed. 08/08/20  Yes [provider]  vitamin B-12 (CYANOCOBALAMIN) 1000 MCG tablet Take 1,000 mcg by mouth daily with breakfast.   Yes [provider]  sodium chloride 1 g tablet Take 1 g by mouth in the morning, at noon, in the evening, and at bedtime. 06/11/20 09/09/20  [provider]    Physical Exam: Constitutional: Moderately built and nourished. Vitals:   09/04/20 1411 09/04/20 1822 09/04/20 1823  BP: (!) 172/61 (!) 181/66 (!) 181/66  Pulse: 72 84 71  Resp: 14 19 (!) 21  Temp: 98.5 F (36.9 C)    TempSrc: Oral    SpO2: 99% 99% 98%   Eyes: Anicteric no pallor. ENMT: No discharge from the ears eyes nose and mouth. Neck: No mass felt.  No neck rigidity. Respiratory: No rhonchi or crepitations. Cardiovascular: S1-S2 heard. Abdomen: Soft nontender bowel sound present. Musculoskeletal: Laceration of the right great toe after injury.  Has been dressed. Skin: Laceration of the right great toe. Neurologic: Alert awake oriented to time place and person.  Moves all extremities. Psychiatric: Appears normal with normal affect.   Labs on Admission: I have personally reviewed following labs and imaging studies  CBC: Recent Labs  Lab 09/04/20 1712  WBC 9.3  NEUTROABS 7.9*  HGB 13.8  HCT  36.8*  MCV 90.0  PLT 99991111   Basic Metabolic Panel: Recent Labs  Lab 09/04/20 1712 09/04/20 2104  NA 116* 117*  K 3.0* 3.1*  CL 82* 81*  CO2 25 25  GLUCOSE 130* 130*  BUN <5* 5*  CREATININE 0.62 0.62  CALCIUM 9.0 9.1  PHOS  --  2.2*   GFR: CrCl cannot be calculated (Unknown ideal weight.). Liver Function Tests: Recent Labs  Lab 09/04/20 2104  ALBUMIN 3.8   No results for input(s):  LIPASE, AMYLASE in the last 168 hours. No results for input(s): AMMONIA in the last 168 hours. Coagulation Profile: No results for input(s): INR, PROTIME in the last 168 hours. Cardiac Enzymes: No results for input(s): CKTOTAL, CKMB, CKMBINDEX, TROPONINI in the last 168 hours. BNP (last 3 results) No results for input(s): PROBNP in the last 8760 hours. HbA1C: No results for input(s): HGBA1C in the last 72 hours. CBG: No results for input(s): GLUCAP in the last 168 hours. Lipid Profile: No results for input(s): CHOL, HDL, LDLCALC, TRIG, CHOLHDL, LDLDIRECT in the last 72 hours. Thyroid Function Tests: No results for input(s): TSH, T4TOTAL, FREET4, T3FREE, THYROIDAB in the last 72 hours. Anemia Panel: No results for input(s): VITAMINB12, FOLATE, FERRITIN, TIBC, IRON, RETICCTPCT in the last 72 hours. Urine analysis:    Component Value Date/Time   COLORURINE YELLOW 08/07/2019 0840   APPEARANCEUR CLEAR 08/07/2019 0840   LABSPEC 1.015 08/07/2019 0840   PHURINE 6.0 08/07/2019 0840   GLUCOSEU 50 (A) 08/07/2019 0840   HGBUR NEGATIVE 08/07/2019 0840   BILIRUBINUR NEGATIVE 08/07/2019 0840   KETONESUR NEGATIVE 08/07/2019 0840   PROTEINUR 100 (A) 08/07/2019 0840   UROBILINOGEN 0.2 02/13/2014 1645   NITRITE NEGATIVE 08/07/2019 0840   LEUKOCYTESUR NEGATIVE 08/07/2019 0840   Sepsis Labs: '@LABRCNTIP'$ (procalcitonin:4,lacticidven:4) )No results found for this or any previous visit (from the past 240 hour(s)).   Radiological Exams on Admission: CT HEAD WO CONTRAST (5MM)  Result Date:  09/04/2020 CLINICAL DATA:  Head trauma EXAM: CT HEAD WITHOUT CONTRAST TECHNIQUE: Contiguous axial images were obtained from the base of the skull through the vertex without intravenous contrast. COMPARISON:  None. FINDINGS: Brain: There is atrophy and chronic small vessel disease changes. No acute intracranial abnormality. Specifically, no hemorrhage, hydrocephalus, mass lesion, acute infarction, or significant intracranial injury. Chronic appearing lacunar infarct in the left thalamus. Vascular: No hyperdense vessel or unexpected calcification. Skull: No acute calvarial abnormality. Sinuses/Orbits: No acute findings Other: No IMPRESSION: Old left thalamic lacunar infarct. Atrophy, chronic microvascular disease. No acute intracranial abnormality. Electronically Signed   By: Rolm Baptise M.D.   On: 09/04/2020 20:53   DG Foot Complete Right  Result Date: 09/04/2020 CLINICAL DATA:  Toe injury.  Hit 2nd toe on wall EXAM: RIGHT FOOT COMPLETE - 3+ VIEW COMPARISON:  None. 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 No acute fracture, subluxation or dislocation. Corticated bone fragment noted at the base of the right 2nd toe proximal phalanx likely related to old injury. IMPRESSION: No visible acute bony abnormality. Electronically Signed   By: Rolm Baptise M.D.   On: 09/04/2020 15:12     Assessment/Plan Principal Problem:   Acute hyponatremia Active Problems:   COPD (chronic obstructive pulmonary disease) (HCC)   Essential hypertension   S/P CABG x 3   Chronic anticoagulation    Acute hyponatremia -appreciate nephrology input.  Cause could be multifactorial.  Patient does drink alcohol which could be contributing to it.  We will keep patient on fluid restriction and closely follow metabolic panel every 4 hours and urine sodium is pending.  TSH cortisol has been ordered.  Holding Lasix and sodium chloride tablets for now.  Patient did receive 1 L fluid bolus in the ER.  Hold off further  fluids. Laceration of the great toe on the right foot after injury.  Since it was more than 8 hours at this time no sutures done.  Tetanus toxoid was given.  Dressing done.  Wound team consult. Alcohol use we will keep patient on thiamine.  Watch out for withdrawals. History of congestive heart failure last EF measured in July 2021 was 45 to 50% with grade 2 diastolic dysfunction appears euvolemic at this time.  Holding Lasix.  Closely follow respiratory status and metabolic panel for sodium.  Fluid restriction. Hypertension uncontrolled on amlodipine ARB Coreg.  We will keep patient on as needed IV hydralazine. History of CAD status post CABG denies any chest pain.  On Xarelto statins and beta-blockers. Peripheral vascular disease status post bypass on Xarelto.Has chronic non heeling wound of heel. Patient also takes Cymbalta and gabapentin. COPD with active tobacco use no wheezing at this time.  COVID test pending.  Addendum -patient started spiking fever 102 F.  I ordered blood cultures chest x-ray and urine cultures and at this time after getting blood cultures I have continued Ancef which was initially given for the right leg laceration.   DVT prophylaxis: Xarelto. Code Status: Full code. Family Communication: Discussed with patient. Disposition Plan: Home. Consults called: Nephrology.  Wound team. Admission status: Observation.   Rise Patience MD [facility] Pager [phone].  If 7PM-7AM, please contact night-coverage [email or web address] Password [facility]  09/04/2020, 9:52 PM

## 2020-09-04 NOTE — ED Notes (Signed)
Pt's daughter is at the bedside and says he had lab work done at [facility] on Friday where they found that his sodium was 114. She also says he has been falling more frequently and has had difficulty passing urine. He attempted to stand at the bedside in the room to urinate into urinal (unsuccessfully) and his toe has started bleeding again. RN cleaned the wound and applied petrolatum gauze and 1" rolled gauze to prevent additional bleeding while awaiting provider eval. Messaged EDP regarding reported abnormal labs and recent falls.

## 2020-09-04 NOTE — ED Provider Notes (Signed)
[facility] EMERGENCY DEPARTMENT Provider Note   CSN: [id number] Arrival date & time: 09/04/20  1400     History Chief Complaint  Patient presents with   Toe Pain    Caleb [name] is a 70 y.o. male.  HPI    70 year old male comes in with chief complaint of toe pain.  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  Patient reports that yesterday night, after dinner he stubbed his toe.  There was a laceration and bleeding.  Patient comes to the ER because of persistent discomfort and open wound.  He was able to achieve hemostasis with dressing at his home.  Unknown last tetanus.  Review of system is negative for any nausea, vomiting, fevers, chills.  Patient denies pain elsewhere.  Daughter at bedside later. Reports that pt has had increased falls, weakness, balance issues and confusion. Went to PCP recently and was diagnosed with hyponatremia. Has had hx of hyponatremia admission in the past with admission at [facility] around Christmas. No new meds.    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   CHF (congestive heart failure) (HCC)    Tobacco abuse    Essential hypertension    Hypercholesteremia    Lactic acidosis 02/12/2014   Sinus tachycardia 02/12/2014   Acute respiratory failure with hypoxia (HCC)    Elevated troponin    COPD (chronic obstructive pulmonary disease) ([place]) 02/11/2014   COPD with acute exacerbation ([place]) 02/11/2014   Abnormal EKG [id number]   Acute systolic (congestive) heart failure ([place]) 02/11/2014    Past Surgical History:  Procedure Laterality Date   BIOPSY  10/31/2018   Procedure: BIOPSY;  Surgeon: Carol Ada, MD;  Location: [facility];  Service: Endoscopy;;   cataract surgery  Bilateral 12-2017 and 01-2018   COLONOSCOPY WITH PROPOFOL N/A 10/31/2018   Procedure: COLONOSCOPY WITH PROPOFOL;  Surgeon: Carol Ada, MD;  Location: [facility];  Service: Endoscopy;  Laterality: N/A;   COLONOSCOPY WITH PROPOFOL N/A 09/18/2019   Procedure: COLONOSCOPY WITH PROPOFOL;  Surgeon: Carol Ada, MD;  Location: [facility];  Service: Endoscopy;  Laterality: N/A;   CORONARY ARTERY BYPASS GRAFT N/A 02/17/2014   Procedure: CORONARY ARTERY BYPASS GRAFTING (CABG);  Surgeon: Rexene Alberts, MD;  Location: [place];  Service: Open Heart Surgery;  Laterality: N/A;  Times 3 using bilateral mammary arteries and endoscopically harvested right saphenous vein   ESOPHAGOGASTRODUODENOSCOPY (EGD) WITH PROPOFOL N/A 10/31/2018   Procedure: ESOPHAGOGASTRODUODENOSCOPY (EGD) WITH PROPOFOL;  Surgeon: Carol Ada, MD;  [facility];  Service: Endoscopy;  Laterality: N/A;   ESOPHAGOGASTRODUODENOSCOPY (EGD) WITH PROPOFOL N/A 09/18/2019   Procedure: ESOPHAGOGASTRODUODENOSCOPY (EGD) WITH PROPOFOL;  Surgeon: Carol Ada, MD;  [facility]: [facility];  Service: Endoscopy;  Laterality: N/A;   FEMORAL ARTERY - FEMORAL ARTERY BYPASS GRAFT      right femoral artery to below-knee popliteal artery bypass with  PTFE and right first ray amputation 04/25/17   HEMOSTASIS CLIP PLACEMENT  10/31/2018   Procedure: HEMOSTASIS CLIP PLACEMENT;  Surgeon: Carol Ada, MD;  [facility];  Service: Endoscopy;;   HEMOSTASIS CLIP PLACEMENT  09/18/2019   Procedure: HEMOSTASIS CLIP PLACEMENT;  Surgeon: Carol Ada, MD;  [facility];  Service: Endoscopy;;   INTRAOPERATIVE TRANSESOPHAGEAL ECHOCARDIOGRAM N/A 02/17/2014   Procedure: INTRAOPERATIVE TRANSESOPHAGEAL ECHOCARDIOGRAM;  Surgeon: Rexene Alberts, MD;  Location: [place];  Service: Open Heart Surgery;  Laterality: N/A;   LEFT HEART CATHETERIZATION WITH CORONARY ANGIOGRAM N/A 02/12/2014   Procedure: LEFT HEART CATHETERIZATION WITH CORONARY ANGIOGRAM;  Surgeon: Sinclair Grooms, MD;  Location: [facility] CATH LAB;  Service: Cardiovascular;  Laterality: N/A;   POLYPECTOMY  10/31/2018   Procedure: POLYPECTOMY;  Surgeon: Carol Ada, MD;  [facility];  Service: Endoscopy;;   POLYPECTOMY  09/18/2019   Procedure: POLYPECTOMY;  Surgeon: Carol Ada, MD;  [facility];  Service: Endoscopy;;   SCLEROTHERAPY  09/18/2019   Procedure: Clide Deutscher;  Surgeon: Carol Ada, MD;  [facility];  Service: Endoscopy;;   SUBMUCOSAL INJECTION  09/18/2019   Procedure: SUBMUCOSAL INJECTION;  Surgeon: Carol Ada, MD;  [facility];  Service: Endoscopy;;   SUBMUCOSAL LIFTING INJECTION  10/31/2018   Procedure: SUBMUCOSAL LIFTING INJECTION;  Surgeon: Carol Ada, MD;  [facility]: [facility];  Service: Endoscopy;;   TOE AMPUTATION Right    right great toe        Family History  Problem Relation Age of Onset   Heart attack Mother    Diabetes Mother    Heart attack Brother    Diabetes Brother    Cancer Sister    Stroke Neg Hx     Social History   Tobacco Use   Smoking status: Every Day    Packs/day: 0.50    Years: 35.00    Pack years: 17.50    Types: Cigarettes    Last attempt to quit: 02/11/2014    Years since  quitting: 6.5   Smokeless tobacco: Never   Tobacco comments:    has quit twice before   Vaping Use   Vaping Use: Never used  Substance Use Topics   Alcohol use: Yes    Alcohol/week: 0.0 standard drinks    Comment: Six pack a day of beer   Drug use: Never    Home Medications Prior to Admission medications   Medication Sig Start Date End Date Taking? Authorizing Provider  acetaminophen (TYLENOL) 500 MG tablet Take 1,000 mg by mouth 2 (two) times daily as needed for mild pain or moderate pain.     [provider]  albuterol (VENTOLIN HFA) 108 (90 Base) MCG/ACT inhaler Inhale 2 puffs into the lungs every 6 (six) hours as needed for wheezing or shortness of breath. 07/24/18   Olalere, Cicero Duck A, MD  amLODipine (NORVASC) 5 MG tablet Take 1 tablet (5 mg total) by mouth daily. 08/17/19 09/16/19  Terrilee Croak, MD  atorvastatin (LIPITOR) 80 MG tablet Take 1 tablet (80 mg total) by mouth daily. 12/14/15   Josue Hector, MD  carvedilol (COREG) 25 MG tablet  Take 25 mg by mouth 2 (two) times daily with a meal.    [provider]  Cholecalciferol (VITAMIN D-3 PO) Take 2 capsules by mouth daily with breakfast.     [provider]  denosumab (PROLIA) 60 MG/ML SOSY injection Inject 1 mL into the skin every 6 (six) months. 01/20/20   [provider]  ferrous sulfate 325 (65 FE) MG tablet Take 1 tablet (325 mg total) by mouth daily with breakfast. 08/17/19 09/16/19  Dahal, Marlowe Aschoff, MD  furosemide (LASIX) 20 MG tablet Take 1 tablet by mouth every other day. 04/16/20   [provider]  gabapentin (NEURONTIN) 300 MG capsule Take 300 mg by mouth 3 (three) times daily.    [provider]  irbesartan (AVAPRO) 300 MG tablet Take 1 tablet (300 mg total) by mouth daily. 08/17/19 09/16/19  Terrilee Croak, MD  nitroGLYCERIN (NITROSTAT) 0.4 MG SL tablet Place 1 tablet (0.4 mg total) under the tongue every 5 (five) minutes as needed for chest pain. 02/03/19 08/06/20  Erlene Quan, PA-C   Olopatadine HCl (PATADAY OP) Place 1 drop into both eyes daily as needed (allergies).    [provider]  pantoprazole (PROTONIX) 40 MG tablet Take 1 tablet by mouth daily. 04/16/20   [provider]  rivaroxaban (XARELTO) 2.5 MG TABS tablet Take 1 tablet (2.5 mg total) by mouth 2 (two) times daily. 08/16/19 09/15/19  Terrilee Croak, MD  sodium chloride 1 g tablet Take by mouth. 06/11/20 09/09/20  [provider]  THIAMINE HCL PO Take by mouth daily.    [provider]  vitamin B-12 (CYANOCOBALAMIN) 1000 MCG tablet Take 1,000 mcg by mouth daily with breakfast.    [provider]    Allergies    Sulfamethoxazole-trimethoprim, Cilostazol, Lisinopril, and Losartan  Review of Systems   Review of Systems  Constitutional:  Negative for activity change, chills and fever.  Gastrointestinal:  Negative for nausea and vomiting.  Musculoskeletal:  Positive for arthralgias.  Skin:  Positive for wound.  Allergic/Immunologic: Negative for immunocompromised state.  Hematological:  Does not bruise/bleed easily.  All other systems reviewed and are negative.  Physical Exam Updated Vital Signs BP (!) 181/66   Pulse 71   Temp 98.5 F (36.9 C) (Oral)   Resp (!) 21   SpO2 98%   Physical Exam Vitals and nursing note reviewed.  Constitutional:      Appearance: He is well-developed.  HENT:     Head: Atraumatic.  Eyes:     Extraocular Movements: Extraocular movements intact.     Pupils: Pupils are equal, round, and reactive to light.  Cardiovascular:     Rate and Rhythm: Normal rate.  Pulmonary:     Effort: Pulmonary effort is normal.  Musculoskeletal:        General: Tenderness, deformity and signs of injury present.     Cervical back: Neck supple.  Skin:    General: Skin is warm.     Findings: Bruising and erythema present.     Comments: Patient's right second toe has a laceration going over the IP joint.  Laceration is over the lateral aspect of the toe  and over the plantar surface.  Gross sensation is still present.  Range of motion is limited due to pain.  2+ dorsalis pedis  Neurological:     Mental Status: He is alert and oriented to person, place, and time.    ED Results / Procedures / Treatments   Labs (all labs ordered are  listed, but only abnormal results are displayed) Labs Reviewed  BASIC METABOLIC PANEL - Abnormal; Notable for the following components:      Result Value   Sodium 116 (*)    Potassium 3.0 (*)    Chloride 82 (*)    Glucose, Bld 130 (*)    BUN <5 (*)    All other components within normal limits  CBC WITH DIFFERENTIAL/PLATELET - Abnormal; Notable for the following components:   RBC 4.09 (*)    HCT 36.8 (*)    MCHC 37.5 (*)    RDW 10.9 (*)    Neutro Abs 7.9 (*)    Lymphs Abs 0.5 (*)    All other components within normal limits  OSMOLALITY - Abnormal; Notable for the following components:   Osmolality 245 (*)    All other components within normal limits  OSMOLALITY, URINE - Abnormal; Notable for the following components:   Osmolality, Ur 241 (*)    All other components within normal limits    EKG None  ED ECG REPORT   Date: 09/04/2020  Rate: 70  Rhythm: normal sinus rhythm  QRS Axis: normal  Intervals: normal  ST/T Wave abnormalities: normal  Conduction Disutrbances:none  Narrative Interpretation:   Old EKG Reviewed: unchanged PVCs  I have personally reviewed the EKG tracing and agree with the computerized printout as noted.   Radiology DG Foot Complete Right  Result Date: 09/04/2020 CLINICAL DATA:  Toe injury.  Hit 2nd toe on wall EXAM: RIGHT FOOT COMPLETE - 3+ VIEW COMPARISON:  None. 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 No acute fracture, subluxation or dislocation. Corticated bone fragment noted at the base of the right 2nd toe proximal phalanx likely related to old injury. IMPRESSION: No visible acute bony abnormality. Electronically Signed   By: Rolm Baptise M.D.   On:  09/04/2020 15:12    Procedures .Critical Care  Date/Time: 09/04/2020 7:16 PM Performed by: Varney Biles, MD Authorized by: Varney Biles, MD   Critical care provider statement:    Critical care time (minutes):  82   Critical care time was exclusive of:  Separately billable procedures and treating other patients   Critical care was necessary to treat or prevent imminent or life-threatening deterioration of the following conditions:  Metabolic crisis   Critical care was time spent personally by me on the following activities:  Discussions with consultants, evaluation of patient's response to treatment, examination of patient, ordering and performing treatments and interventions, ordering and review of laboratory studies, ordering and review of radiographic studies, pulse oximetry, re-evaluation of patient's condition, obtaining history from patient or surrogate, review of old charts and development of treatment plan with patient or surrogate   I assumed direction of critical care for this patient from another provider in my specialty: yes     Care discussed with: admitting provider     Medications Ordered in ED Medications  Tdap (BOOSTRIX) injection 0.5 mL (0.5 mLs Intramuscular Given 09/04/20 1519)  ceFAZolin (ANCEF) IVPB 1 g/50 mL premix (0 g Intravenous Stopped 09/04/20 2003)  sodium chloride 0.9 % bolus 1,000 mL (1,000 mLs Intravenous New Bag/Given 09/04/20 1926)    ED Course  I have reviewed the triage vital signs and the nursing notes.  Pertinent labs & imaging results that were available during my care of the patient were reviewed by me and considered in my medical decision making (see chart for details).    MDM Rules/Calculators/A&P  70 year old male comes in with chief complaint of toe injury. Foot x-rays did not reveal any acute fracture, questionable old fracture.  Dedicated toe x-ray was ordered, but the radiology tech informed me that they  would not be able to get any better quality films.  I went and reassessed the patient, he is able to move his toes.  Lower suspicion for acute fracture.  The injury occurred about 18 hours prior to my assessment.  Given that he has amputation and in general at high risk for infection and poor healing, laceration repair is not indicated in his toe at this time.  Tetanus will be updated.  He will receive IM Ancef.  We will clean up the wound and apply not at the same dressing.    Spoke with Dr. Earleen Newport, podiatry.  They will follow-up with the patient later this week.  Patient's information has been sent to the podiatrist as per his request.  Later inpatient stay, daughter comes to the bedside and informs me of the increased balance issues over the last week along with low sodium that was uncovered on Saturday.  I reviewed patient's chart extensively and looked at the admission from around Christmas of 2021.  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  He does not appear volume overloaded.  Labs ordered here and there is evidence of hyponatremia with sodium of 116.  CT head ordered given the fall on anticoagulation.  Spoke with nephrology service, Dr. Johnney Ou will come and see the patient.  For now she is okay with [place] expanding the volume with normal saline.  Final Clinical Impression(s) / ED Diagnoses Final diagnoses:  Acute hyponatremia  Contusion of toe with damage to nail, unspecified laterality, unspecified toe, initial encounter  Laceration of toe without foreign body present or damage to nail, unspecified laterality, unspecified toe, initial encounter    Rx / DC Orders ED Discharge Orders     None        Varney Biles, MD 09/04/20 2008

## 2020-09-04 NOTE — ED Triage Notes (Signed)
Pt reports hitting is right 2nd toe on the corner of a wall last night.

## 2020-09-04 NOTE — ED Notes (Signed)
MD notified of elevated BP. Also received call from lab to report critical serum osmolality 245; reported result to MD.

## 2020-09-04 NOTE — ED Notes (Signed)
Pt transported to Xray at this time.

## 2020-09-04 NOTE — ED Notes (Signed)
Patient transported to CT 

## 2020-09-04 NOTE — Consult Note (Addendum)
Caleb [name]  INPATIENT CONSULTATION  Reason for Consultation: hyponatremia Requesting Provider: Dr. Kathrynn Humble  HPI: Caleb [name] is an 70 y.o. male with CAD s/p CABG 2016, combined CHF, COPD, HTN, HL, h/o hyponatremia who is seen for evaluation and management of hyponatremia.    Pt has labs with PCP Friday -- Na 114 ([facility] everywhere); family contacted and ED recommended but declined -- encouraged to take his QID NaCl tabs and f/u Monday. Presented to [facility] ED this afternoon for toe trauma  with bleeding laceration.  Family reported weak, falling.  5pm Na 116, K 3, Bicarb 25, BUN <5, Cr 0.62, Serum osm 245, urine osm 241, normal CBC.  Hypertensive 170s, afebrile.  Rec'd 1L NS, CT head pending.    Pt evaluated in ED, alone in room.  Feels generally ok currently.  Denies HA, vision changes, neurologic deficits.  Admits to low po intake of nutritious foods chronically, drinks at least 6pk of beer nightly. Denies h/o seizures, DTs.  No NSAIDs.  Increased urinary frequency of low volume voids lately.  No recent CHF exacerbations.  Per ED report daughter said doesn't always take meds, including the NaCl tabs; he says she puts his meds out and lives next door.  Currently smoker 1/2 PPD, chronic productive cough unchanged.   Admission in 12/2019 -- admission Na 120 up to 132; urine osms 370s serum <276. No records available for details in care everywhere that I can see.   Outpt serum sodiums 125-132 since that time. 06/2020 nephrology e-consult from [facility] reviewed "this is a tough one" --> component of SIADH + high fluid to solute intake ratio with rec to give salt tabs and QOD lasix to balance his CHF.    PMH: Past Medical History:  Diagnosis Date   Acute respiratory failure with hypoxia (HCC)    Acute systolic congestive heart failure ([place]) 02/11/2014   COPD (chronic obstructive pulmonary disease) ([place]) 02/11/2014   Essential hypertension    Hypercholesteremia    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    LIMA to LAD, RIMA to RCA, SVG to OM1, EVH via right thigh   Tobacco abuse    PSH: Past Surgical History:  Procedure Laterality Date   BIOPSY  10/31/2018   Procedure: BIOPSY;  Surgeon: Carol Ada, MD;  [facility]: [facility];  Service: Endoscopy;;   cataract surgery  Bilateral 12-2017 and 01-2018   COLONOSCOPY WITH PROPOFOL N/A 10/31/2018   Procedure: COLONOSCOPY WITH PROPOFOL;  Surgeon: Carol Ada, MD;  [facility];  Service: Endoscopy;  Laterality: N/A;   COLONOSCOPY WITH PROPOFOL N/A 09/18/2019   Procedure: COLONOSCOPY WITH PROPOFOL;  Surgeon: Carol Ada, MD;  [facility];  Service: Endoscopy;  Laterality: N/A;   CORONARY ARTERY BYPASS GRAFT N/A 02/17/2014   Procedure: CORONARY ARTERY BYPASS GRAFTING (CABG);  Surgeon: Rexene Alberts, MD;  Location: [facility];  Service: Open Heart Surgery;  Laterality: N/A;  Times 3 using bilateral mammary arteries and endoscopically harvested right saphenous vein   ESOPHAGOGASTRODUODENOSCOPY (EGD) WITH PROPOFOL N/A 10/31/2018   Procedure: ESOPHAGOGASTRODUODENOSCOPY (EGD) WITH PROPOFOL;  Surgeon: Carol Ada, MD;  [facility];  Service: Endoscopy;  Laterality: N/A;   ESOPHAGOGASTRODUODENOSCOPY (EGD) WITH PROPOFOL N/A 09/18/2019   Procedure: ESOPHAGOGASTRODUODENOSCOPY (EGD) WITH PROPOFOL;  Surgeon: Carol Ada, MD;  [facility];  Service: Endoscopy;  Laterality: N/A;   FEMORAL ARTERY - FEMORAL ARTERY BYPASS GRAFT      right femoral artery to below-knee popliteal artery bypass with PTFE and right first ray  amputation 04/25/17   HEMOSTASIS CLIP PLACEMENT  10/31/2018   Procedure: HEMOSTASIS CLIP PLACEMENT;  Surgeon: Carol Ada, MD;  [facility];  Service: Endoscopy;;   HEMOSTASIS CLIP PLACEMENT  09/18/2019   Procedure: HEMOSTASIS CLIP PLACEMENT;  Surgeon: Carol Ada, MD;  [facility];  Service: Endoscopy;;   INTRAOPERATIVE TRANSESOPHAGEAL ECHOCARDIOGRAM N/A 02/17/2014   Procedure:  INTRAOPERATIVE TRANSESOPHAGEAL ECHOCARDIOGRAM;  Surgeon: Rexene Alberts, MD;  Location: [facility];  Service: Open Heart Surgery;  Laterality: N/A;   LEFT HEART CATHETERIZATION WITH CORONARY ANGIOGRAM N/A 02/12/2014   Procedure: LEFT HEART CATHETERIZATION WITH CORONARY ANGIOGRAM;  Surgeon: Sinclair Grooms, MD;  Location: [facility];  Service: Cardiovascular;  Laterality: N/A;   POLYPECTOMY  10/31/2018   Procedure: POLYPECTOMY;  Surgeon: Carol Ada, MD;  [facility];  Service: Endoscopy;;   POLYPECTOMY  09/18/2019   Procedure: POLYPECTOMY;  Surgeon: Carol Ada, MD;  [facility];  Service: Endoscopy;;   SCLEROTHERAPY  09/18/2019   Procedure: Clide Deutscher;  Surgeon: Carol Ada, MD;  [facility];  Service: Endoscopy;;   SUBMUCOSAL INJECTION  09/18/2019   Procedure: SUBMUCOSAL INJECTION;  Surgeon: Carol Ada, MD;  [facility];  Service: Endoscopy;;   SUBMUCOSAL LIFTING INJECTION  10/31/2018   Procedure: SUBMUCOSAL LIFTING INJECTION;  Surgeon: Carol Ada, MD;  [facility];  Service: Endoscopy;;   TOE AMPUTATION Right    right great toe      Past Medical History:  Diagnosis Date   Acute respiratory failure with hypoxia ([place])    Acute systolic congestive heart failure ([place]) 02/11/2014   COPD (chronic obstructive pulmonary disease) ([place]) 02/11/2014   Essential hypertension    Hypercholesteremia    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   LIMA to LAD, RIMA to RCA, SVG to OM1, EVH via right thigh   Tobacco abuse     Medications:  I have reviewed the patient's current medications.   (Not in a hospital admission)   ALLERGIES:   Allergies  Allergen Reactions   Sulfamethoxazole-Trimethoprim Other (See Comments)    "Made potassium go high and sodium go low"   Cilostazol Swelling and Other (See Comments)    Site of swelling not recalled by the patient   Lisinopril Cough and Other (See Comments)   Losartan Swelling    Pt's daughter stated it  made pt's blood pressure and sodium too low, but pt has been tolerating irbesartan.    FAM HX: Family History  Problem Relation Age of Onset   Heart attack Mother    Diabetes Mother    Heart attack Brother    Diabetes Brother    Cancer Sister    Stroke Neg Hx     Social History:   reports that he has been smoking cigarettes. He has a 17.50 pack-year smoking history. He has never used smokeless tobacco. He reports current alcohol use. He reports that he does not use drugs.  ROS: 12 system ROS per HPI above  Blood pressure (!) 181/66, pulse 71, temperature 98.5 F (36.9 C), temperature source Oral, resp. rate (!) 21, SpO2 98 %. PHYSICAL EXAM: Gen: awake and alert on stretcher  Eyes: +glasses, anicteric, EOMI ENT: MMM, poor dentition Neck: supple, no JVD CV:  RRR Abd: soft, nontender  Lungs: scattered rhonchi and wheezing, normal WOB, no RA with 98% sat GU: no foley Extr:  R 1st toe amputated remotedly, 2nd toe with bloody dressing, oozing, no erythema on foot, trace ankle edema BL, doughy Neuro: AOx3, attentive to  conversation, grossly nonfocal Skin: cool and dry   Results for orders placed or performed during the hospital encounter of 09/04/20 (from the past 48 hour(s))  Basic metabolic panel     Status: Abnormal   Collection Time: 09/04/20  5:12 PM  Result Value Ref Range   Sodium 116 (LL) 135 - 145 mmol/L    Comment: CRITICAL RESULT CALLED TO, READ BACK BY AND VERIFIED WITH: K CARTER RN BY SSTEPHENS [id number]    Potassium 3.0 (L) 3.5 - 5.1 mmol/L   Chloride 82 (L) 98 - 111 mmol/L   CO2 25 22 - 32 mmol/L   Glucose, Bld 130 (H) 70 - 99 mg/dL    Comment: Glucose reference range applies only to samples taken after fasting for at least 8 hours.   BUN <5 (L) 8 - 23 mg/dL   Creatinine, Ser 0.62 0.61 - 1.24 mg/dL   Calcium 9.0 8.9 - 10.3 mg/dL   GFR, Estimated >60 >60 mL/min    Comment: (NOTE) Calculated using the CKD-EPI Creatinine Equation (2021)    Anion gap 9 5 -  15    Comment: Performed at [facility] [address], [place], [place] [address]  CBC with Differential     Status: Abnormal   Collection Time: 09/04/20  5:12 PM  Result Value Ref Range   WBC 9.3 4.0 - 10.5 K/uL   RBC 4.09 (L) 4.22 - 5.81 MIL/uL   Hemoglobin 13.8 13.0 - 17.0 g/dL   HCT 36.8 (L) 39.0 - 52.0 %   MCV 90.0 80.0 - 100.0 fL   MCH 33.7 26.0 - 34.0 pg   MCHC 37.5 (H) 30.0 - 36.0 g/dL   RDW 10.9 (L) 11.5 - 15.5 %   Platelets 155 150 - 400 K/uL   nRBC 0.0 0.0 - 0.2 %   Neutrophils Relative % 86 %   Neutro Abs 7.9 (H) 1.7 - 7.7 K/uL   Lymphocytes Relative 5 %   Lymphs Abs 0.5 (L) 0.7 - 4.0 K/uL   Monocytes Relative 9 %   Monocytes Absolute 0.8 0.1 - 1.0 K/uL   Eosinophils Relative 0 %   Eosinophils Absolute 0.0 0.0 - 0.5 K/uL   Basophils Relative 0 %   Basophils Absolute 0.0 0.0 - 0.1 K/uL   Immature Granulocytes 0 %   Abs Immature Granulocytes 0.04 0.00 - 0.07 K/uL    Comment: Performed at [facility], [place] [address], [place], [place] [address]  Osmolality     Status: Abnormal   Collection Time: 09/04/20  5:12 PM  Result Value Ref Range   Osmolality 245 (LL) 275 - 295 mOsm/kg    Comment: REPEATED TO VERIFY CRITICAL RESULT CALLED TO, READ BACK BY AND VERIFIED WITH:  Newman Pies, RN '@[id number]'$  ON 09/04/20 BY Park Pope, MT  Performed at [facility] [address] [place], [place] [address]   Osmolality, urine     Status: Abnormal   Collection Time: 09/04/20  5:13 PM  Result Value Ref Range   Osmolality, Ur 241 (L) 300 - 900 mOsm/kg    Comment: Performed at [facility] [address], [place], [place] [address]    DG Foot Complete Right  Result Date: 09/04/2020 CLINICAL DATA:  Toe injury.  Hit 2nd toe on wall EXAM: RIGHT FOOT COMPLETE - 3+ VIEW COMPARISON:  None. 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 No acute fracture, subluxation or dislocation. Corticated bone fragment noted at the base of the right  2nd toe proximal  phalanx likely related to old injury. IMPRESSION: No visible acute bony abnormality. Electronically Signed   By: Rolm Baptise M.D.   On: 09/04/2020 15:12    Assessment/Plan **Euvolvemic hyponatremia:  Pt with history of hyponatremia typically maintained in high 120-low130s on NaCl and QOD lasix.  Currently he appears euvolemic.  Labs suggesting low solute intake/beer consumption main issues this time.  WIll add on Urine sodium.  Rec'd 1L NA bolus in ED already, will await repeat labs which I've ordered stat and q4h for the next 24h.  I don't see an indication for hypertonic saline at this time.  Will f/u labs and make further decision re: fluids.  In the meantime supplement K which will help as well.   Hold PO NaCL and lasix at this moment but seems that was fairly effective since 12/2019 until now so will likely resume at some point this admission.  Follow I/Os strictly, daily weights.  Check TSH and cortisol for completeness.   **Hypokalemia: KCL 20 BID x 2 doses.   **Urinary frequency: check UA and PVR, I/O cath if > 300 overnight.    **EtOH use: significant beer consumption contributing to hypona; watch for withdrawal symptoms.   **Combined CHF: doesn't appear clinically volume overloaded, judicious fluids and low threshold to resume diuretic.  **HTN:  resume home meds and follow.   **Toe trauma: no fracture on x ray; per ED.   Justin Mend 09/04/2020, 7:45 PM

## 2020-09-04 NOTE — Progress Notes (Signed)
Nephrology quick note --  Rev'd 9pm labs -- serum sodium from 116 to 117.   Started 0.9% NaCl at 152m/hr and giving KCl repletion.  Check q4h sodium.   If next serum sodium > 119 would pause NaCl infusion until next check.

## 2020-09-05 ENCOUNTER — Observation Stay (HOSPITAL_COMMUNITY): Payer: Medicare HMO

## 2020-09-05 DIAGNOSIS — Z79899 Other long term (current) drug therapy: Secondary | ICD-10-CM | POA: Diagnosis not present

## 2020-09-05 DIAGNOSIS — I5042 Chronic combined systolic (congestive) and diastolic (congestive) heart failure: Secondary | ICD-10-CM | POA: Diagnosis present

## 2020-09-05 DIAGNOSIS — E871 Hypo-osmolality and hyponatremia: Secondary | ICD-10-CM | POA: Diagnosis present

## 2020-09-05 DIAGNOSIS — Z23 Encounter for immunization: Secondary | ICD-10-CM | POA: Diagnosis present

## 2020-09-05 DIAGNOSIS — S91114A Laceration without foreign body of right lesser toe(s) without damage to nail, initial encounter: Secondary | ICD-10-CM | POA: Diagnosis present

## 2020-09-05 DIAGNOSIS — M79674 Pain in right toe(s): Secondary | ICD-10-CM | POA: Diagnosis not present

## 2020-09-05 DIAGNOSIS — E440 Moderate protein-calorie malnutrition: Secondary | ICD-10-CM | POA: Diagnosis present

## 2020-09-05 DIAGNOSIS — Z20822 Contact with and (suspected) exposure to covid-19: Secondary | ICD-10-CM | POA: Diagnosis present

## 2020-09-05 DIAGNOSIS — I2510 Atherosclerotic heart disease of native coronary artery without angina pectoris: Secondary | ICD-10-CM | POA: Diagnosis present

## 2020-09-05 DIAGNOSIS — E876 Hypokalemia: Secondary | ICD-10-CM | POA: Diagnosis present

## 2020-09-05 DIAGNOSIS — Z95820 Peripheral vascular angioplasty status with implants and grafts: Secondary | ICD-10-CM | POA: Diagnosis not present

## 2020-09-05 DIAGNOSIS — F1020 Alcohol dependence, uncomplicated: Secondary | ICD-10-CM | POA: Diagnosis present

## 2020-09-05 DIAGNOSIS — I70201 Unspecified atherosclerosis of native arteries of extremities, right leg: Secondary | ICD-10-CM | POA: Diagnosis present

## 2020-09-05 DIAGNOSIS — I10 Essential (primary) hypertension: Secondary | ICD-10-CM | POA: Diagnosis not present

## 2020-09-05 DIAGNOSIS — R296 Repeated falls: Secondary | ICD-10-CM | POA: Diagnosis not present

## 2020-09-05 DIAGNOSIS — Z951 Presence of aortocoronary bypass graft: Secondary | ICD-10-CM | POA: Diagnosis not present

## 2020-09-05 DIAGNOSIS — J449 Chronic obstructive pulmonary disease, unspecified: Secondary | ICD-10-CM | POA: Diagnosis present

## 2020-09-05 DIAGNOSIS — Z833 Family history of diabetes mellitus: Secondary | ICD-10-CM | POA: Diagnosis not present

## 2020-09-05 DIAGNOSIS — S91119S Laceration without foreign body of unspecified toe without damage to nail, sequela: Secondary | ICD-10-CM | POA: Diagnosis not present

## 2020-09-05 DIAGNOSIS — G9341 Metabolic encephalopathy: Secondary | ICD-10-CM | POA: Diagnosis present

## 2020-09-05 DIAGNOSIS — Z8249 Family history of ischemic heart disease and other diseases of the circulatory system: Secondary | ICD-10-CM | POA: Diagnosis not present

## 2020-09-05 DIAGNOSIS — E7800 Pure hypercholesterolemia, unspecified: Secondary | ICD-10-CM | POA: Diagnosis present

## 2020-09-05 DIAGNOSIS — Z7901 Long term (current) use of anticoagulants: Secondary | ICD-10-CM | POA: Diagnosis not present

## 2020-09-05 DIAGNOSIS — I951 Orthostatic hypotension: Secondary | ICD-10-CM | POA: Diagnosis present

## 2020-09-05 DIAGNOSIS — L89624 Pressure ulcer of left heel, stage 4: Secondary | ICD-10-CM | POA: Diagnosis present

## 2020-09-05 DIAGNOSIS — I110 Hypertensive heart disease with heart failure: Secondary | ICD-10-CM | POA: Diagnosis present

## 2020-09-05 DIAGNOSIS — W228XXA Striking against or struck by other objects, initial encounter: Secondary | ICD-10-CM | POA: Diagnosis present

## 2020-09-05 DIAGNOSIS — F17210 Nicotine dependence, cigarettes, uncomplicated: Secondary | ICD-10-CM | POA: Diagnosis present

## 2020-09-05 LAB — BASIC METABOLIC PANEL
Anion gap: 9 (ref 5–15)
Anion gap: 8 (ref 5–15)
BUN: 5 mg/dL — ABNORMAL LOW (ref 8–23)
BUN: 5 mg/dL — ABNORMAL LOW (ref 8–23)
CO2: 23 mmol/L (ref 22–32)
CO2: 24 mmol/L (ref 22–32)
Calcium: 8.4 mg/dL — ABNORMAL LOW (ref 8.9–10.3)
Calcium: 9 mg/dL (ref 8.9–10.3)
Chloride: 89 mmol/L — ABNORMAL LOW (ref 98–111)
Chloride: 88 mmol/L — ABNORMAL LOW (ref 98–111)
Creatinine, Ser: 0.55 mg/dL — ABNORMAL LOW (ref 0.61–1.24)
Creatinine, Ser: 0.61 mg/dL (ref 0.61–1.24)
GFR, Estimated: >60 mL/min (ref >60)
GFR, Estimated: >60 mL/min (ref >60)
Glucose, Bld: 108 mg/dL — ABNORMAL HIGH (ref 70–99)
Glucose, Bld: 132 mg/dL — ABNORMAL HIGH (ref 70–99)
Potassium: 3.5 mmol/L (ref 3.5–5.1)
Potassium: 3.5 mmol/L (ref 3.5–5.1)
Sodium: 121 mmol/L — ABNORMAL LOW (ref 135–145)
Sodium: 120 mmol/L — ABNORMAL LOW (ref 135–145)

## 2020-09-05 LAB — RENAL FUNCTION PANEL
Albumin: 3.2 g/dL — ABNORMAL LOW (ref 3.5–5.0)
Albumin: 3.5 g/dL (ref 3.5–5.0)
Albumin: 3.3 g/dL — ABNORMAL LOW (ref 3.5–5.0)
Albumin: 3.2 g/dL — ABNORMAL LOW (ref 3.5–5.0)
Anion gap: 9 (ref 5–15)
Anion gap: 9 (ref 5–15)
Anion gap: 10 (ref 5–15)
Anion gap: 9 (ref 5–15)
BUN: 5 mg/dL — ABNORMAL LOW (ref 8–23)
BUN: 5 mg/dL — ABNORMAL LOW (ref 8–23)
BUN: <5 mg/dL — ABNORMAL LOW (ref 8–23)
BUN: <5 mg/dL — ABNORMAL LOW (ref 8–23)
CO2: 23 mmol/L (ref 22–32)
CO2: 24 mmol/L (ref 22–32)
CO2: 19 mmol/L — ABNORMAL LOW (ref 22–32)
CO2: 21 mmol/L — ABNORMAL LOW (ref 22–32)
Calcium: 8.5 mg/dL — ABNORMAL LOW (ref 8.9–10.3)
Calcium: 9.1 mg/dL (ref 8.9–10.3)
Calcium: 9.1 mg/dL (ref 8.9–10.3)
Calcium: 9 mg/dL (ref 8.9–10.3)
Chloride: 88 mmol/L — ABNORMAL LOW (ref 98–111)
Chloride: 90 mmol/L — ABNORMAL LOW (ref 98–111)
Chloride: 91 mmol/L — ABNORMAL LOW (ref 98–111)
Chloride: 90 mmol/L — ABNORMAL LOW (ref 98–111)
Creatinine, Ser: 0.58 mg/dL — ABNORMAL LOW (ref 0.61–1.24)
Creatinine, Ser: 0.61 mg/dL (ref 0.61–1.24)
Creatinine, Ser: 0.61 mg/dL (ref 0.61–1.24)
Creatinine, Ser: 0.65 mg/dL (ref 0.61–1.24)
GFR, Estimated: >60 mL/min (ref >60)
GFR, Estimated: >60 mL/min (ref >60)
GFR, Estimated: >60 mL/min (ref >60)
GFR, Estimated: >60 mL/min (ref >60)
Glucose, Bld: 156 mg/dL — ABNORMAL HIGH (ref 70–99)
Glucose, Bld: 138 mg/dL — ABNORMAL HIGH (ref 70–99)
Glucose, Bld: 124 mg/dL — ABNORMAL HIGH (ref 70–99)
Glucose, Bld: 158 mg/dL — ABNORMAL HIGH (ref 70–99)
Phosphorus: 1.2 mg/dL — ABNORMAL LOW (ref 2.5–4.6)
Phosphorus: 1.3 mg/dL — ABNORMAL LOW (ref 2.5–4.6)
Phosphorus: 2.4 mg/dL — ABNORMAL LOW (ref 2.5–4.6)
Phosphorus: 2.8 mg/dL (ref 2.5–4.6)
Potassium: 2.7 mmol/L — CL (ref 3.5–5.1)
Potassium: 3.8 mmol/L (ref 3.5–5.1)
Potassium: 3.8 mmol/L (ref 3.5–5.1)
Potassium: 3.7 mmol/L (ref 3.5–5.1)
Sodium: 120 mmol/L — ABNORMAL LOW (ref 135–145)
Sodium: 123 mmol/L — ABNORMAL LOW (ref 135–145)
Sodium: 120 mmol/L — ABNORMAL LOW (ref 135–145)
Sodium: 120 mmol/L — ABNORMAL LOW (ref 135–145)

## 2020-09-05 LAB — SODIUM, URINE, RANDOM: Sodium, Ur: 91 mmol/L

## 2020-09-05 LAB — TSH: TSH: 1.366 u[IU]/mL (ref 0.350–4.500)

## 2020-09-05 LAB — URINALYSIS, ROUTINE W REFLEX MICROSCOPIC
Bacteria, UA: NONE SEEN
Bilirubin Urine: NEGATIVE
Glucose, UA: 50 mg/dL — AB
Hgb urine dipstick: NEGATIVE
Ketones, ur: NEGATIVE mg/dL
Leukocytes,Ua: NEGATIVE
Nitrite: NEGATIVE
Protein, ur: 30 mg/dL — AB
Specific Gravity, Urine: 1.006 (ref 1.005–1.030)
pH: 8 (ref 5.0–8.0)

## 2020-09-05 LAB — SARS CORONAVIRUS 2 (TAT 6-24 HRS): SARS Coronavirus 2: NEGATIVE

## 2020-09-05 LAB — LACTIC ACID, PLASMA
Lactic Acid, Venous: 0.8 mmol/L (ref 0.5–1.9)
Lactic Acid, Venous: 0.9 mmol/L (ref 0.5–1.9)

## 2020-09-05 LAB — MAGNESIUM: Magnesium: 1.9 mg/dL (ref 1.7–2.4)

## 2020-09-05 LAB — CORTISOL-AM, BLOOD: Cortisol - AM: 9.7 ug/dL (ref 6.7–22.6)

## 2020-09-05 LAB — PHOSPHORUS: Phosphorus: 1.5 mg/dL — ABNORMAL LOW (ref 2.5–4.6)

## 2020-09-05 MED ORDER — POTASSIUM CHLORIDE 10 MEQ/100ML IV SOLN
10.0000 meq | INTRAVENOUS | Status: AC
  Administered 2020-09-05 (×3): 10 meq via INTRAVENOUS
  Filled 2020-09-05 (×3): qty 100

## 2020-09-05 MED ORDER — POTASSIUM CHLORIDE CRYS ER 20 MEQ PO TBCR
40.0000 meq | EXTENDED_RELEASE_TABLET | Freq: Once | ORAL | Status: AC
  Administered 2020-09-05: 40 meq via ORAL
  Filled 2020-09-05: qty 2

## 2020-09-05 MED ORDER — HYDRALAZINE HCL 20 MG/ML IJ SOLN: 10.0000 mg | INTRAMUSCULAR | Status: DC | PRN

## 2020-09-05 MED ORDER — SODIUM PHOSPHATES 45 MMOLE/15ML IV SOLN
30.0000 mmol | Freq: Once | INTRAVENOUS | Status: AC
  Administered 2020-09-05: 30 mmol via INTRAVENOUS
  Filled 2020-09-05: qty 10

## 2020-09-05 MED ORDER — CEFAZOLIN SODIUM-DEXTROSE 2-4 GM/100ML-% IV SOLN
2.0000 g | Freq: Three times a day (TID) | INTRAVENOUS | Status: DC
  Administered 2020-09-05 – 2020-09-06 (×5): 2 g via INTRAVENOUS
  Filled 2020-09-05 (×6): qty 100

## 2020-09-05 NOTE — Progress Notes (Signed)
Spoke to ER physician around 9:50pm 8/21 in regards to patient. Podiatry will be by to see him today.

## 2020-09-05 NOTE — Consult Note (Signed)
[place] Nurse Consult Note: Reason for [place] is simultaneously consulted with podiatric medicine for the care of the laceration (traumatic injury) to the right great toe. The patient has been seen in the outpatient wound care clinic at [facility] in [place] for the care of a healing Stage 4 pressure injury to the left heel. He was lat seen there on 08/19/20. I will provide conservative care orders until Podiatry sees; their orders will supercede mine. Wound type:Trauma Pressure Injury POA: Yes (left heel) Measurement:Bedside RN to obtain today and document on Nursing Flow Sheet Wound bed: Heel with evidence of previous wound healing (scarring), pink, dry RGT laceration to be measured by Bedside RN and documented on Nursing Flow Sheet. Drainage (amount, consistency, odor) small amount serosanguinous Periwound: intact, dry Dressing procedure/placement/frequency: I will continue the orders by the [facility] Provider in the outpatient [facility] for a betadine application followed by a dry dressing to the left heel with Prevalon Boots provided for Pressure redistribution. The right great toe to be dressed with xeroform antimicrobial, nonadherent and changed twice daily.  As noted previously, podiatric medicine orders will supercede mine after they assess. Prevalon Boots and a sacral prophylactic foam dressing are ordered for PI prevention.  [place] nursing team will not follow, but will remain available to this patient, the nursing and medical teams.  Please re-consult if needed. Thanks, Maudie Flakes, MSN, RN, [place], Arther Abbott  Pager# [phone]

## 2020-09-05 NOTE — Progress Notes (Signed)
Admit: 09/04/2020 LOS: 0  Caleb [name] AoC hypotnic euvolemic hyponatremia, ASx, probably is low solute related  Subjective:  SNa improvedw ith IVFs overnight, then held given concern for overcorrection He has had eggs, grilled cheese today NO HA, vision changes, confusion Great UOP   08/21 0701 - 08/22 0700 In: 1150 [P.O.:100; IV Piggyback:1050] Out: 1550 [Urine:1550]  Filed Weights   09/05/20 0054  Weight: 67.3 kg    Scheduled Meds:  amLODipine  5 mg Oral Daily   aspirin EC  81 mg Oral Daily   atorvastatin  80 mg Oral Daily   carvedilol  25 mg Oral BID WC   cholecalciferol  1,000 Units Oral Q breakfast   DULoxetine  60 mg Oral Daily   ferrous sulfate  325 mg Oral Q breakfast   folic acid  1 mg Oral Daily   gabapentin  300 mg Oral TID   irbesartan  300 mg Oral Daily   pantoprazole  40 mg Oral Daily   rivaroxaban  2.5 mg Oral BID   thiamine  100 mg Oral Daily   vitamin B-12  1,000 mcg Oral Q breakfast   Continuous Infusions:   ceFAZolin (ANCEF) IV 2 g (09/05/20 1413)   sodium phosphate  Dextrose 5% IVPB     PRN Meds:.acetaminophen **OR** acetaminophen, albuterol, hydrALAZINE, nitroGLYCERIN  Current Labs: reviewed  Results for Caleb, [name] (MRN [id number]) as of 09/05/2020 14:46  Ref. Range 09/04/2020 17:13  Osmolality, Urine Latest Ref Range: 300 - 900 mOsm/kg 241 (L)  Results for Caleb, [name] (MRN [id number]) as of 09/05/2020 14:46  Ref. Range 09/05/2020 04:40  Sodium, Urine Latest Units: mmol/L 91  Results for Caleb, [name] (MRN [id number]) as of 09/05/2020 14:46  Ref. Range 09/04/2020 17:12  Osmolality Latest Ref Range: 275 - 295 mOsm/kg 245 (LL)    Physical Exam:  Blood pressure (!) 173/67, pulse 65, temperature 99.4 F (37.4 C), temperature source Oral, resp. rate 18, height '5\' 4"'$  (1.626 m), weight 67.3 kg, SpO2 95 %. NAD, chronically ill appearing RRR CTAB No [place] Nonfocal, AAO x3 S/nt/nd  A AoC hypotonic euvolemic hyponatremia: I think this is primarily low solute  driven leading  impaired free water excretion.  UNa is higher than would be expected.  It did improve with IVFs which is not consistent with SIADH.   R 2nd toe laceration, podatry following Hypophosphatemia, likely poor PO Heavy ETOH use Hypokalemia, improved with repletion Hx/o CHF, not decompensated HTN  P BMP to direct further IVFs Replete Phos Encouraged adequate PO intake, esp protein. Salt tabs arent that useful.   Will cont to follow  [name] 09/05/2020, 2:45 PM  Recent Labs  Lab 09/04/20 2104 09/05/20 0318 09/05/20 0635 09/05/20 0732  NA 117* 120*  --  121*  K 3.1* 2.7*  --  3.5  CL 81* 88*  --  89*  CO2 25 23  --  23  GLUCOSE 130* 156*  --  108*  BUN 5* 5*  --  5*  CREATININE 0.62 0.58*  --  0.55*  CALCIUM 9.1 8.5*  --  8.4*  PHOS 2.2* 1.2* 1.5*  --    Recent Labs  Lab 09/04/20 1712  WBC 9.3  NEUTROABS 7.9*  HGB 13.8  HCT 36.8*  MCV 90.0  PLT 155

## 2020-09-05 NOTE — Care Management Obs Status (Signed)
[facility] NOTIFICATION   Patient Details  Name: Caleb [name] MRN: [id number] Date of Birth: 05/03/1950   Medicare Observation Status Notification Given:  Yes    Bethena Roys, RN 09/05/2020, 5:11 PM

## 2020-09-05 NOTE — Progress Notes (Signed)
Pharmacy Antibiotic Note  Caleb [name] is a 70 y.o. male admitted on 09/04/2020 with  R great toe wound .  Pharmacy has been consulted for Cefazolin dosing.  Plan: Cefazolin 2gm IV Q8h Will f/u renal function, micro data, and pt's clinical condition  Height: '5\' 4"'$  (162.6 cm) Weight: 67.3 kg (148 lb 5.9 oz) IBW/kg (Calculated) : 59.2  Temp (24hrs), Avg:99.4 F (37.4 C), Min:98.2 F (36.8 C), Max:102.4 F (39.1 C)  Recent Labs  Lab 09/04/20 1712 09/04/20 2104 09/05/20 0318  WBC 9.3  --   --   CREATININE 0.62 0.62 0.58*    Estimated Creatinine Clearance: 70.9 mL/min (A) (by C-G formula based on SCr of 0.58 mg/dL (L)).    Allergies  Allergen Reactions   Sulfamethoxazole-Trimethoprim Other (See Comments)    "Made potassium go high and sodium go low"   Cilostazol Swelling and Other (See Comments)    Site of swelling not recalled by the patient   Lisinopril Cough and Other (See Comments)   Losartan Swelling    Pt's daughter stated it made pt's blood pressure and sodium too low, but pt has been tolerating irbesartan.    Antimicrobials this admission: 8/21 Cefazolin>>   Microbiology results:  BCx:  8/22 UCx:    Thank you for allowing pharmacy to be a part of this patient's care.  Sherlon Handing, PharmD, BCPS Please see amion for complete clinical pharmacist phone list 09/05/2020 6:28 AM

## 2020-09-05 NOTE — Progress Notes (Signed)
PHYSICAL THERAPY EVALUATION   09/05/20 1155  PT Visit Information  Last PT Received On 09/05/20  Assistance Needed +1  History of Present Illness Pt is a 70yo male presenting to [facility] ED on 8/21 with complaints of fall and associated right second toe injury. Also found to have acute hyponatremia. PMH: s/p right great toe removal, COPD, CHF, HTN, s/p CABGx3.  Precautions  Precautions Fall  Precaution Comments Recent fall while carrying clothes from [place]yer. Pt reports other falls in the past two months that were "less bad."  Restrictions  Weight Bearing Restrictions No  Home Living  Family/patient expects to be discharged to: Private residence  Living Arrangements Alone  Available Help at Discharge Family;Available PRN/intermittently (Daughter lives next door)  Type of [place] to enter  Entrance Stairs-Number of Steps 4  Entrance Stairs-Rails Right  Home Layout One level  Bathroom Shower/Tub Tub/shower unit;Walk-in Cytogeneticist Yes  [place]y - single point;Wheelchair - [employer] - 2 wheels  Prior Function  Level of Independence Needs assistance  Gait / Transfers Assistance Needed Uses RW to ambulate at home. Reports he uses WC as a chair to sit in, but does not use for mobility. Uses cane to get to and from the car.  ADL's / [place] with ADLs, daughter goes to grocery store for him.  Comments Pt reports he has stopped driving since his right great toe amptuation.  Communication  Communication No difficulties  Pain Assessment  Pain Assessment No/denies pain  Cognition  Arousal/Alertness Awake/alert  Behavior During Therapy WFL for tasks assessed/performed;Flat affect  Overall Cognitive Status Within Functional Limits for tasks assessed  Upper Extremity Assessment  Upper Extremity Assessment Generalized weakness;RUE deficits/detail;LUE deficits/detail;Defer to OT  evaluation  RUE Deficits / Details Gross strength 4/5  LUE Deficits / Details Gross strength 4/5  Lower Extremity Assessment  Lower Extremity Assessment Generalized weakness;RLE deficits/detail;LLE deficits/detail  RLE Deficits / Details Gross strength 4/5, ROM WFL. Bandage on medial toes.  LLE Deficits / Details Gross strength 4/5, ROM WFL. Bandage on posterior heel.  Cervical / Trunk Assessment  Cervical / Trunk Assessment Kyphotic  Bed Mobility  Overal bed mobility Needs Assistance  Bed Mobility Supine to Sit;Sit to Supine  Supine to sit Min guard  Sit to supine Min guard  General bed mobility comments Pt required min guard for bed mobility for safety and line management only.  Transfers  Overall transfer level Needs assistance  Equipment used Rolling walker (2 wheeled)  Transfers Sit to/from Stand  Sit to Stand Min guard  General transfer comment Pt required min guard for safety only; moderate cuing for hand placement.  Ambulation/Gait  Ambulation/Gait assistance Min guard  Gait Distance (Feet) 125 Feet  Assistive device Rolling walker (2 wheeled)  Gait Pattern/deviations Decreased step length - right;Step-through pattern;Decreased step length - left  General Gait Details Pt ambulated with min guard for safety and line management using RW.  One minor LOB while turning around but pt was able to steady himself. Demonstrated guarded gait with decreased step length and speed. Right foot was externally rotated and pt ambulated mostly on his heel and instep. Educated about using RW at all times during mobility at home.  Gait velocity decreased  Balance  Overall balance assessment Needs assistance  Sitting-balance support Feet supported  Sitting balance-Leahy Scale Fair  Standing balance support Bilateral upper extremity supported;During functional activity  Standing balance-Leahy Scale Poor  Standing balance comment Pt reliant on BUE on RW to maintain balance  PT - End of Session   Equipment Utilized During Treatment Gait belt  Activity Tolerance Patient tolerated treatment well  Patient left in bed;with call bell/phone within reach;with bed alarm set  Nurse Communication Mobility status  PT Assessment  PT Recommendation/Assessment Patient needs continued PT services  PT Visit Diagnosis Unsteadiness on feet (R26.81);History of falling (Z91.81);Muscle weakness (generalized) (M62.81);Difficulty in walking, not elsewhere classified (R26.2)  PT Problem List Decreased strength;Decreased range of motion;Decreased activity tolerance;Decreased balance;Decreased mobility;Decreased coordination;Decreased knowledge of use of DME;Decreased safety awareness  PT Plan  PT Frequency (ACUTE ONLY) Min 3X/week  PT Treatment/Interventions (ACUTE ONLY) DME instruction;Gait training;Stair training;Functional mobility training;Therapeutic activities;Therapeutic exercise;Balance training;Patient/family education  AM-PAC PT "6 Clicks" Mobility Outcome Measure (Version 2)  Help needed turning from your back to your side while in a flat bed without using bedrails? 3  Help needed moving from lying on your back to sitting on the side of a flat bed without using bedrails? 3  Help needed moving to and from a bed to a chair (including a wheelchair)? 3  Help needed standing up from a chair using your arms (e.g., wheelchair or bedside chair)? 3  Help needed to walk in hospital room? 3  Help needed climbing 3-5 steps with a railing?  2  6 Click Score 17  Consider Recommendation of Discharge To: Home with [facility]  Progressive Mobility  What is the highest level of mobility based on the progressive mobility assessment? Level 5 (Walks with assist in room/hall) - Balance while stepping forward/back and can walk in room with assist - Complete  Mobility Ambulated with assistance in hallway  PT Recommendation  Follow Up Recommendations Home health PT;Supervision - Intermittent  PT equipment None recommended by  PT (Pt has required DME)  Individuals Consulted  Consulted and Agree with Results and Recommendations Patient  Acute Rehab PT Goals  Patient Stated Goal to go home  PT Goal Formulation With patient  Time For Goal Achievement 09/19/20  Potential to Achieve Goals Good  PT Time Calculation  PT Start Time (ACUTE ONLY) 1153  PT Stop Time (ACUTE ONLY) 1221  PT Time Calculation (min) (ACUTE ONLY) 28 min  PT General Charges  $$ ACUTE PT VISIT 1 Visit  PT Evaluation  $PT Eval Low Complexity 1 Low  PT Treatments  $Gait Training 8-22 mins  Written Expression  Dominant Hand Right   Pt presents with the impairments and problems listed above. Pt required min guard for all mobility tasks for safety and line management only, with moderate verbal cuing. During ambulation, pt had one minor LOB during turning, but was able to self-correct. Educated pt on increased utilization of RW upon discharge for increased safety. Recommending HHPT after discharge. We will continue to follow him acutely to promote independence with functional mobility.  Dawayne Cirri, SPT

## 2020-09-05 NOTE — Consult Note (Signed)
PODIATRY CONSULTATION  NAME Caleb [name] MRN [id number] DOB Jun 23, 1950 DOA 09/04/2020   Reason for consult:  Chief Complaint  Patient presents with   Toe Pain    Consulting physician: Varney Biles  History of present illness: 70 y.o. male presenting for evaluation of a laceration to the right second toe.  Patient states that 2 days ago he sustained a trip and fall injury and at that time injured his right second toe.  When the patient was admitted x-rays were taken which were negative for acute fracture.  Podiatry was consulted.  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   LIMA to LAD, RIMA to RCA, SVG to OM1, EVH via right thigh   Tobacco abuse     CBC Latest Ref Rng & Units 09/04/2020 08/01/2020 04/13/2020  WBC 4.0 - 10.5 K/uL 9.3 5.0 6.7  Hemoglobin 13.0 - 17.0 g/dL 13.8 14.1 14.2  Hematocrit 39.0 - 52.0 % 36.8(L) 40.1 40.4  Platelets 150 - 400 K/uL 155 177 170    BMP Latest Ref Rng & Units 09/05/2020 09/05/2020 09/04/2020  Glucose 70 - 99 mg/dL 108(H) 156(H) 130(H)  BUN 8 - 23 mg/dL 5(L) 5(L) 5(L)  Creatinine 0.61 - 1.24 mg/dL 0.55(L) 0.58(L) 0.62  BUN/Creat Ratio 10 - 24 - - -  Sodium 135 - 145 mmol/L 121(L) 120(L) 117(LL)  Potassium 3.5 - 5.1 mmol/L 3.5 2.7(LL) 3.1(L)  Chloride 98 - 111 mmol/L 89(L) 88(L) 81(L)  CO2 22 - 32 mmol/L '23 23 25  '$ Calcium 8.9 - 10.3 mg/dL 8.4(L) 8.5(L) 9.1       Physical Exam: General: The patient is alert and oriented x3 in no acute distress.   Dermatology: Laceration approximately 1.5 cm in length noted to the medial aspect of the right second toe.  Please see picture above.  No purulence.  No drainage.  The laceration edges appear to be somewhat well coapted.  Vascular: Palpable pedal pulses  bilaterally.  Ecchymosis with edema noted and bruising to the right second toe.  Skin is cool to touch.  Neurological: Epicritic and protective threshold grossly intact bilaterally.   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 No acute fracture, subluxation or dislocation. Corticated bone fragment noted at the base of the right 2nd toe proximal phalanx likely related to old injury.   IMPRESSION: No visible acute bony abnormality.    ASSESSMENT/PLAN OF CARE Laceration RT second toe -Nurse is present today upon presentation -The laceration occurred 2 days ago.  The laceration appears stable with the laceration edges well coapted.  I do not see any benefit now at this time for primary closure with suture. -There is no indication of infection. -Continue conservative care including Xeroform with light dressing daily. -Recommend discharge on short course of oral antibiotics for prophylaxis -Recommend that the patient follows up within 1 week of discharge.   Thank you for the consult.  Please contact me directly with any questions or concerns.  Cell [phone]   Edrick Kins, DPM [facility]  Dr. Edrick Kins, DPM    [address]  [place], [place] [address]                Office [phone]  Fax [phone]

## 2020-09-05 NOTE — Progress Notes (Signed)
   09/05/20 0054  Assess: MEWS Score  Temp (!) 102.4 F (39.1 C)  BP (!) 171/74  Pulse Rate 73  ECG Heart Rate 70  Resp 18  SpO2 98 %  O2 Device Room Air  Assess: MEWS Score  MEWS Temp 2  MEWS Systolic 0  MEWS Pulse 0  MEWS RR 0  MEWS LOC 0  MEWS Score 2  MEWS Score Color Yellow  Assess: if the MEWS score is Yellow or Red  Were vital signs taken at a resting state? Yes  Focused Assessment Change from prior assessment (see assessment flowsheet)  Early Detection of Sepsis Score *See Row Information* Medium  MEWS guidelines implemented *See Row Information* Yes  Treat  MEWS Interventions Administered scheduled meds/treatments  Pain Scale 0-10  Pain Score 0  Take Vital Signs  Increase Vital Sign Frequency  Yellow: Q 2hr X 2 then Q 4hr X 2, if remains yellow, continue Q 4hrs  Notify: Charge Nurse/RN  Name of Charge Nurse/RN Notified Jacobey Gura   Date Charge Nurse/RN Notified 09/05/20  Notify: Provider  Provider Name/Title  (Dr Reece Levy)  Date Provider Notified 09/05/20  Notification Type Page  Notification Reason Change in status  Provider response See new orders  Date of Provider Response 09/05/20  Document  Patient Outcome Stabilized after interventions  Progress note created (see row info) Yes

## 2020-09-05 NOTE — Progress Notes (Signed)
PROGRESS NOTE   Caleb [name]  [id number]    DOB: 1950/08/16    DOA: 09/04/2020  PCP: Percell Belt, DO   I have briefly reviewed patients previous medical records in [facility].  Chief Complaint  Patient presents with   Toe Pain    Brief Narrative:  70 year old male with PMH of CAD s/p CABG, chronic systolic CHF with last EF 45-50% and grade 2 diastolic dysfunction in July 2021, PAD s/p femoropopliteal bypass, ongoing tobacco abuse, alcohol use disorder, COPD, chronic hyponatremia, was referred to the ED after patient's recent serum sodium done as outpatient 2 days prior to admission was 114.  He reported feeling weak at times and confused as per patient's daughter.  He also recently hit his leg while taking laundry out of the washing machine and had falls without head injury or LOC.  Recently his Lasix dose was increased due to leg swelling.  In ED, lab work showed serum sodium of 116, potassium 3, serum osmolarity 245, urine osmolarity 241, CT head unremarkable, x-ray of right foot negative for fracture.  Nephrology consulted, briefly on IV saline hydration, serum sodium improved.   Assessment & Plan:  Principal Problem:   Acute hyponatremia Active Problems:   COPD (chronic obstructive pulmonary disease) (HCC)   Essential hypertension   S/P CABG x 3   Chronic anticoagulation   Subacute on chronic hyponatremia: Likely multifactorial related to poor oral intake, beer intake, recent increase in Lasix.  Nephrology input appreciated.  Briefly on IV saline hydration, BMP was closely monitored.  This has gradually improved to 123.  Picture not consistent with SIADH.  IV fluids discontinued.  Follow BMP in AM.  Encourage oral intake.  Per nephrology, salt tablets do not help.  Wonder if Cymbalta is also contributing.  AMS resolved  Alcohol dependence: Reports drinking up to 6 beers daily.  No overt withdrawal.  Abstinence counseled.  Continue CIWA protocol.  Hypokalemia: Replaced.   Magnesium normal.  Hypophosphatemia: Being replaced.  Follow phosphorus in AM.  Chronic systolic CHF: Compensated.  Holding Lasix for now.  Essential hypertension: Mildly uncontrolled at times.  Continue amlodipine, carvedilol and irbesartan.  As needed IV hydralazine.  CAD s/p CABG: No anginal symptoms.  Continue beta-blockers.  PAD s/p bypass: Chronic nonhealing left heel wound but not infected.  Unclear if he is on Xarelto for this indication.  Right second toe laceration: Podiatry input appreciated.  They do not see benefit now off primary closure with sutures since this occurred 2 days prior to admission.  They indicate no infection.  Patient however had fever up to 102 last night.  Continue IV Ancef for now.  Switch to oral antibiotics at time of discharge.  Outpatient follow-up with Dr. Daylene Katayama.  S/p tetanus booster in ED.  Fever: Unclear etiology.  Urine microscopy not consistent with UTI.  Chest x-ray without acute findings.  Continue empiric Ancef for now.  COPD: Stable  Tobacco abuse: Cessation counseled.  Body mass index is 25.47 kg/m.   DVT prophylaxis:   Xarelto   Code Status: Full Code Family Communication: None at bedside Disposition:  Status is: Observation  The patient will require care spanning > 2 midnights and should be moved to inpatient because: Inpatient level of care appropriate due to severity of illness  Dispo: The patient is from: Home              Anticipated d/c is to: Home  Patient currently is not medically stable to d/c.   Difficult to place patient No        Consultants:   Nephrology Podiatry.  Procedures:   None.  Antimicrobials:    Anti-infectives (From admission, onward)    Start     Dose/Rate Route Frequency Ordered Stop   09/05/20 0700  ceFAZolin (ANCEF) IVPB 2g/100 mL premix        2 g 200 mL/hr over 30 Minutes Intravenous Every 8 hours 09/05/20 [id number]     09/04/20 1900  ceFAZolin (ANCEF) IVPB 1 g/50 mL  premix        1 g 100 mL/hr over 30 Minutes Intravenous  Once 09/04/20 1848 09/04/20 2003         Subjective:  Patient interviewed and examined along with 2 nurses at bedside this morning.  Alert and oriented x3.  Volunteers to drinking 6 beers every day.  Denies complaints.  No pain reported.  Objective:   Vitals:   09/05/20 0505 09/05/20 0812 09/05/20 1138 09/05/20 1600  BP: (!) 173/66 (!) 167/96 (!) 173/67 (!) 142/61  Pulse: 68 65  66  Resp: 16 18  (!) 21  Temp: 98.4 F (36.9 C) 99.4 F (37.4 C)  98.2 F (36.8 C)  TempSrc: Oral Oral  Oral  SpO2: 94% 95%  97%  Weight:      Height:        General exam: Elderly male, moderately built, frail and chronically ill looking lying comfortably propped up in bed without distress.  Oral mucosa with borderline hydration. Respiratory system: Clear to auscultation. Respiratory effort normal. Cardiovascular system: S1 & S2 heard, RRR. No JVD, murmurs, rubs, gallops or clicks. No pedal edema.  Telemetry personally reviewed: Sinus rhythm. Gastrointestinal system: Abdomen is nondistended, soft and nontender. No organomegaly or masses felt. Normal bowel sounds heard. Central nervous system: Alert and oriented. No focal neurological deficits. Extremities: Symmetric 5 x 5 power.  Right first toe amputation has healed.  Laceration over medial aspect of right second toe without bleeding.  Right toe looks bruised.  No clinical features consistent with cellulitis.  Left heel with tiny superficial ulcer with no acute findings. Skin: No rashes, lesions or ulcers Psychiatry: Judgement and insight appear normal. Mood & affect flat.     Data Reviewed:   I have personally reviewed following labs and imaging studies   CBC: Recent Labs  Lab 09/04/20 1712  WBC 9.3  NEUTROABS 7.9*  HGB 13.8  HCT 36.8*  MCV 90.0  PLT [id number]    Basic Metabolic Panel: Recent Labs  Lab 09/05/20 0318 09/05/20 0635 09/05/20 0732 09/05/20 1325  NA 120*  --  121*  123*  K 2.7*  --  3.5 3.8  CL 88*  --  89* 90*  CO2 23  --  23 24  GLUCOSE 156*  --  108* 138*  BUN 5*  --  5* 5*  CREATININE 0.58*  --  0.55* 0.61  CALCIUM 8.5*  --  8.4* 9.1  MG 1.9  --   --   --   PHOS 1.2* 1.5*  --  1.3*    Liver Function Tests: Recent Labs  Lab 09/04/20 2104 09/05/20 0318 09/05/20 1325  ALBUMIN 3.8 3.2* 3.5    CBG: No results for input(s): GLUCAP in the last 168 hours.  Microbiology Studies:   Recent Results (from the past 240 hour(s))  SARS CORONAVIRUS 2 (TAT 6-24 HRS) Nasopharyngeal Nasopharyngeal Swab     Status: None  Collection Time: 09/04/20  9:22 PM   Specimen: Nasopharyngeal Swab  Result Value Ref Range Status   SARS Coronavirus 2 NEGATIVE NEGATIVE Final    Comment: (NOTE) SARS-CoV-2 target nucleic acids are NOT DETECTED.  The SARS-CoV-2 RNA is generally detectable in upper and lower respiratory specimens during the acute phase of infection. Negative results do not preclude SARS-CoV-2 infection, do not rule out co-infections with other pathogens, and should not be used as the sole basis for treatment or other patient management decisions. Negative results must be combined with clinical observations, patient history, and epidemiological information. The expected result is Negative.  Fact Sheet for Patients: SugarRoll.be  Fact Sheet for Healthcare Providers: https://www.woods-mathews.com/  This test is not yet approved or cleared by the [place] FDA and  has been authorized for detection and/or diagnosis of SARS-CoV-2 by FDA under an Emergency Use Authorization (EUA). This EUA will remain  in effect (meaning this test can be used) for the duration of the COVID-19 declaration under Se ction 564(b)(1) of the Act, 21 U.S.C. section 360bbb-3(b)(1), unless the authorization is terminated or revoked sooner.  Performed at [facility], [address] [place], [place] [address]       Radiology Studies:  CT HEAD WO CONTRAST (5MM)  Result Date: 09/04/2020 CLINICAL DATA:  Head trauma EXAM: CT HEAD WITHOUT CONTRAST TECHNIQUE: Contiguous axial images were obtained from the base of the skull through the vertex without intravenous contrast. COMPARISON:  None. FINDINGS: Brain: There is atrophy and chronic small vessel disease changes. No acute intracranial abnormality. Specifically, no hemorrhage, hydrocephalus, mass lesion, acute infarction, or significant intracranial injury. Chronic appearing lacunar infarct in the left thalamus. Vascular: No hyperdense vessel or unexpected calcification. Skull: No acute calvarial abnormality. Sinuses/Orbits: No acute findings Other: No IMPRESSION: Old left thalamic lacunar infarct. Atrophy, chronic microvascular disease. No acute intracranial abnormality. Electronically Signed   By: Rolm Baptise M.D.   On: 09/04/2020 20:53   DG CHEST PORT 1 VIEW  Result Date: 09/05/2020 CLINICAL DATA:  Fever EXAM: PORTABLE CHEST 1 VIEW COMPARISON:  08/11/2019 FINDINGS: Cardiomegaly. Prior CABG. Large lung volumes with diaphragm flattening. There is no edema, consolidation, effusion, or pneumothorax. Remote left more than right rib fractures with healed deformity. IMPRESSION: COPD without acute superimposed finding. Electronically Signed   By: Monte Fantasia M.D.   On: 09/05/2020 04:44   DG Foot Complete Right  Result Date: 09/04/2020 CLINICAL DATA:  Toe injury.  Hit 2nd toe on wall EXAM: RIGHT FOOT COMPLETE - 3+ VIEW COMPARISON:  None. 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 No acute fracture, subluxation or dislocation. Corticated bone fragment noted at the base of the right 2nd toe proximal phalanx likely related to old injury. IMPRESSION: No visible acute bony abnormality. Electronically Signed   By: Rolm Baptise M.D.   On: 09/04/2020 15:12     Scheduled Meds:    amLODipine  5 mg Oral Daily   aspirin EC  81 mg Oral Daily   atorvastatin  80  mg Oral Daily   carvedilol  25 mg Oral BID WC   cholecalciferol  1,000 Units Oral Q breakfast   DULoxetine  60 mg Oral Daily   ferrous sulfate  325 mg Oral Q breakfast   folic acid  1 mg Oral Daily   gabapentin  300 mg Oral TID   irbesartan  300 mg Oral Daily   pantoprazole  40 mg Oral Daily   rivaroxaban  2.5 mg Oral BID   thiamine  100 mg Oral Daily   vitamin B-12  1,000 mcg Oral Q breakfast    Continuous Infusions:     ceFAZolin (ANCEF) IV 2 g (09/05/20 1413)   sodium phosphate  Dextrose 5% IVPB 30 mmol (09/05/20 1619)     LOS: 0 days     Vernell Leep, MD, [place], [facility]. [facility]    To contact the attending provider between 7A-7P or the covering provider during after hours 7P-7A, please log into the web site [email or web address] and access using universal [place] password for that web site. If you do not have the password, please call the hospital operator.  09/05/2020, 5:01 PM

## 2020-09-05 NOTE — TOC Initial Note (Signed)
Transition of Care [facility]) - Initial/Assessment Note    Patient Details  Name: Caleb [name] MRN: [id number] Date of Birth: Nov 05, 1950  Transition of Care [facility]) CM/SW Contact:    Bethena Roys, RN Phone Number: 09/05/2020, 5:15 PM  Clinical Narrative:  Case Manager spoke with patient regarding home health services. Patient asked Case Manager to call his daughter to go over [facility] Needs and the [employer]. Daughter Levada Dy states that the patient goes to [facility] for his wound care needs. She drives the patient to all appointments. Per daughter, the patient has been using a wheelchair. Daughter Levada Dy is agreeable to home health services. Daughter does not have a preference; however, wants an agency in network. Case Manager made the referral to [facility] and start of care to begin within 24-48 hours post transition home. Patient will need home health PT orders and F2F-please follow for orders.                 Expected Discharge Plan: [place] Barriers to Discharge: Continued Medical Work up   Patient Goals and CMS Choice Patient states their goals for this hospitalization and ongoing recovery are:: to return home   Choice offered to / list presented to : NA (Daughter did not have a preference- called [facility] in network with the insurance.)  Expected Discharge Plan and Services Expected Discharge Plan: [place] In-house Referral: NA Discharge Planning Services: CM Consult Post Acute Care Choice: [place] arrangements for the past 2 months: Single Family Home                 DME Arranged: N/A         HH Arranged: PT HH Agency: [place] Date [place]: 09/05/20 Time HH Agency Contacted: 1714 Representative spoke with at [place]: Tommi Rumps  Prior Living Arrangements/Services Living arrangements for the past 2 months: [place] with:: Adult Children Patient language and need for interpreter  reviewed:: Yes Do you feel safe going back to the place where you live?: Yes      Need for Family Participation in Patient Care: Yes (Comment) Care giver support system in place?: Yes (comment)   Criminal Activity/Legal Involvement Pertinent to Current Situation/Hospitalization: No - Comment as needed  Activities of Daily Living Home Assistive Devices/Equipment: Eyeglasses, Cane (specify quad or straight), Walker (specify type), Wheelchair ADL Screening (condition at time of admission) Patient's cognitive ability adequate to safely complete daily activities?: Yes Is the patient deaf or have difficulty hearing?: No Does the patient have difficulty seeing, even when wearing glasses/contacts?: No Does the patient have difficulty concentrating, remembering, or making decisions?: No Patient able to express need for assistance with ADLs?: Yes Does the patient have difficulty dressing or bathing?: No Independently performs ADLs?: Yes (appropriate for developmental age) Does the patient have difficulty walking or climbing stairs?: Yes Weakness of Legs: Both Weakness of Arms/Hands: None  Permission Sought/Granted Permission sought to share information with : Family Supports, Case Freight forwarder, Chartered certified accountant granted to share information with : Yes, Verbal Permission Granted     Permission granted to share info w AGENCY: [facility]        Emotional Assessment Appearance:: Appears stated age Attitude/Demeanor/Rapport: Engaged Affect (typically observed): Appropriate Orientation: : Oriented to Situation, Oriented to Place, Oriented to Self, Oriented to  Time Alcohol / Substance Use: Not Applicable Psych Involvement: No (comment)  Admission diagnosis:  Acute hyponatremia [E87.1] Hyponatremia [E87.1]  Laceration of toe without foreign body present or damage to nail, unspecified laterality, unspecified toe, initial encounter [S91.119A] Contusion of toe with damage to nail,  unspecified laterality, unspecified toe, initial encounter [S90.229A] Patient Active Problem List   Diagnosis Date Noted   Spinal stenosis of cervical region 04/21/2020   Monoclonal gammopathy 04/13/2020   SIRS (systemic inflammatory response syndrome) ([place]) 08/07/2019   Demand ischemia ([place])    Gastritis with hemorrhage 02/03/2019   PVD (peripheral vascular disease) ([place]) 02/03/2019   Chronic anticoagulation 02/03/2019   Acute kidney injury ([place]) 08/14/2018   Iron deficiency anemia 08/14/2018   Acute hyponatremia 08/13/2018   Renal artery stenosis ([place]) 10/22/2017   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   CHF (congestive heart failure) (HCC)    Tobacco abuse    Essential hypertension    Hypercholesteremia    Lactic acidosis 02/12/2014   Sinus tachycardia 02/12/2014   Acute respiratory failure with hypoxia (HCC)    Elevated troponin    COPD (chronic obstructive pulmonary disease) ([place]) 02/11/2014   COPD with acute exacerbation ([place]) 02/11/2014   Abnormal EKG [id number]   Acute systolic (congestive) heart failure ([place]) 02/11/2014   PCP:  Percell Belt, DO Pharmacy:   [facility] [address] [employer] - [address] [place] [place] [address] Phone: [phone] Fax: [facility] - [place], [place] [address] Phone: [phone] Fax: [phone] Readmission Risk Interventions No flowsheet data found.

## 2020-09-06 DIAGNOSIS — R296 Repeated falls: Secondary | ICD-10-CM | POA: Diagnosis not present

## 2020-09-06 DIAGNOSIS — E871 Hypo-osmolality and hyponatremia: Secondary | ICD-10-CM | POA: Diagnosis not present

## 2020-09-06 LAB — CBC
HCT: 35.1 % — ABNORMAL LOW (ref 39.0–52.0)
Hemoglobin: 13.1 g/dL (ref 13.0–17.0)
MCH: 34 pg (ref 26.0–34.0)
MCHC: 37.3 g/dL — ABNORMAL HIGH (ref 30.0–36.0)
MCV: 91.2 fL (ref 80.0–100.0)
Platelets: 163 10*3/uL (ref 150–400)
RBC: 3.85 MIL/uL — ABNORMAL LOW (ref 4.22–5.81)
RDW: 11.4 % — ABNORMAL LOW (ref 11.5–15.5)
WBC: 9.1 10*3/uL (ref 4.0–10.5)
nRBC: 0 % (ref 0.0–0.2)

## 2020-09-06 LAB — RENAL FUNCTION PANEL
Albumin: 3.3 g/dL — ABNORMAL LOW (ref 3.5–5.0)
Anion gap: 9 (ref 5–15)
BUN: 5 mg/dL — ABNORMAL LOW (ref 8–23)
CO2: 23 mmol/L (ref 22–32)
Calcium: 8.9 mg/dL (ref 8.9–10.3)
Chloride: 90 mmol/L — ABNORMAL LOW (ref 98–111)
Creatinine, Ser: 0.68 mg/dL (ref 0.61–1.24)
GFR, Estimated: >60 mL/min (ref >60)
Glucose, Bld: 120 mg/dL — ABNORMAL HIGH (ref 70–99)
Phosphorus: 2.6 mg/dL (ref 2.5–4.6)
Potassium: 3.4 mmol/L — ABNORMAL LOW (ref 3.5–5.1)
Sodium: 122 mmol/L — ABNORMAL LOW (ref 135–145)

## 2020-09-06 LAB — URINE CULTURE: Culture: NO GROWTH

## 2020-09-06 LAB — SODIUM: Sodium: 123 mmol/L — ABNORMAL LOW (ref 135–145)

## 2020-09-06 MED ORDER — CEPHALEXIN 500 MG PO CAPS
500.0000 mg | ORAL_CAPSULE | Freq: Three times a day (TID) | ORAL | Status: DC
  Administered 2020-09-06 – 2020-09-09 (×9): 500 mg via ORAL
  Filled 2020-09-06 (×9): qty 1

## 2020-09-06 MED ORDER — POTASSIUM CHLORIDE CRYS ER 20 MEQ PO TBCR
40.0000 meq | EXTENDED_RELEASE_TABLET | Freq: Once | ORAL | Status: AC
  Administered 2020-09-06: 40 meq via ORAL
  Filled 2020-09-06: qty 2

## 2020-09-06 NOTE — Progress Notes (Signed)
Admit: 09/04/2020 LOS: 1  63M AoC hypotnic euvolemic hyponatremia, ASx, probably is low solute related  Subjective:  SNa stuck 120 to 122 Daughter at bedside, confirms not much nutrition at home; at least 6pack beer / day Just recently started cymbalta for pain, hasn't  been very effective NO HA, vision changes, confusion Adequate UOP   08/22 0701 - 08/23 0700 In: 200 [IV Piggyback:200] Out: [place] [Urine:775]  Filed Weights   09/05/20 0054 09/06/20 0400  Weight: 67.3 kg 68 kg    Scheduled Meds:  amLODipine  5 mg Oral Daily   aspirin EC  81 mg Oral Daily   atorvastatin  80 mg Oral Daily   carvedilol  25 mg Oral BID WC   cephALEXin  500 mg Oral Q8H   cholecalciferol  1,000 Units Oral Q breakfast   ferrous sulfate  325 mg Oral Q breakfast   folic acid  1 mg Oral Daily   gabapentin  300 mg Oral TID   irbesartan  300 mg Oral Daily   pantoprazole  40 mg Oral Daily   rivaroxaban  2.5 mg Oral BID   thiamine  100 mg Oral Daily   vitamin B-12  1,000 mcg Oral Q breakfast   Continuous Infusions:   PRN Meds:.acetaminophen **OR** acetaminophen, albuterol, hydrALAZINE, nitroGLYCERIN  Current Labs: reviewed  Results for TAYDON, KLASS (MRN [id number]) as of 09/05/2020 14:46  Ref. Range 09/04/2020 17:13  Osmolality, Urine Latest Ref Range: 300 - 900 mOsm/kg 241 (L)  Results for IMRAAN, STICH (MRN [id number]) as of 09/05/2020 14:46  Ref. Range 09/05/2020 04:40  Sodium, Urine Latest Units: mmol/L 91  Results for KAMAURIE, SCHNELLER (MRN [id number]) as of 09/05/2020 14:46  Ref. Range 09/04/2020 17:12  Osmolality Latest Ref Range: 275 - 295 mOsm/kg 245 (LL)    Physical Exam:  Blood pressure 123/68, pulse 64, temperature 97.8 F (36.6 C), temperature source Oral, resp. rate 18, height '5\' 4"'$  (1.626 m), weight 68 kg, SpO2 100 %. NAD, chronically ill appearing RRR CTAB No Stgermain Nonfocal, AAO x3 S/nt/nd  A AoC hypotonic euvolemic hyponatremia: I think this is primarily low solute driven leading   impaired free water excretion. (Low P and BUN support this) UNa is higher than would be expected.  It did improve with IVFs which is not consistent with SIADH.  Cont to push solute, limit fliuds (1.2L total daily input); stop cymbalta as well given little improvement in his pain syndrome.   R 2nd toe laceration, podatry following Hypophosphatemia, likely poor PO, repleted;  Heavy ETOH use Hypokalemia, improved with repletion Hx/o CHF, not decompensated HTN  P Hold further IVFs for now Check Na now Stop cymbalta Cont to push PO, esp protein Will cont to follow  [name] 09/06/2020, 3:52 PM  Recent Labs  Lab 09/05/20 1651 09/05/20 1735 09/06/20 0541  NA 120* 120* 122*  K 3.8 3.7 3.4*  CL 91* 90* 90*  CO2 19* 21* 23  GLUCOSE 124* 158* 120*  BUN <5* <5* 5*  CREATININE 0.61 0.65 0.68  CALCIUM 9.1 9.0 8.9  PHOS 2.4* 2.8 2.6    Recent Labs  Lab 09/04/20 1712 09/06/20 0541  WBC 9.3 9.1  NEUTROABS 7.9*  --   HGB 13.8 13.1  HCT 36.8* 35.1*  MCV 90.0 91.2  PLT 155 163

## 2020-09-06 NOTE — Progress Notes (Signed)
PROGRESS NOTE   Caleb [name]  [id number]    DOB: May 02, 1950    DOA: 09/04/2020  PCP: Percell Belt, DO   I have briefly reviewed patients previous medical records in [facility].  Chief Complaint  Patient presents with   Toe Pain    Brief Narrative:  70 year old male with PMH of CAD s/p CABG, chronic systolic CHF with last EF 45-50% and grade 2 diastolic dysfunction in July 2021, PAD s/p femoropopliteal bypass, ongoing tobacco abuse, alcohol use disorder, COPD, chronic hyponatremia, was referred to the ED after patient's recent serum sodium done as outpatient 2 days prior to admission was 114.  He reported feeling weak at times and confused as per patient's daughter.  He also recently hit his leg while taking laundry out of the washing machine and had falls without head injury or LOC.  Recently his Lasix dose was increased due to leg swelling.  In ED, lab work showed serum sodium of 116, potassium 3, serum osmolarity 245, urine osmolarity 241, CT head unremarkable, x-ray of right foot negative for fracture.  Nephrology consulted, briefly on IV saline hydration, serum sodium improved and stable.   Assessment & Plan:  Principal Problem:   Acute hyponatremia Active Problems:   COPD (chronic obstructive pulmonary disease) (HCC)   Essential hypertension   S/P CABG x 3   Chronic anticoagulation   Hyponatremia   Subacute on chronic hyponatremia: Likely multifactorial related to poor oral intake, beer intake, recent increase in Lasix.  Nephrology input appreciated.  Briefly on IV saline hydration, BMP was closely monitored.  Picture not consistent with SIADH.  IV fluids discontinued.  Encourage oral intake.  Per nephrology, salt tablets do not help.  Wonder if Cymbalta is also contributing.  AMS resolved.  Serum sodium has improved and stabilized in the low 120 range.  It appears that his baseline is in the mid 120s.  Follow BMP this evening and tomorrow.  If remains stable then  possible DC home tomorrow.  Acute metabolic encephalopathy: Seems to have resolved.  May be multifactorial.  Unsure if hyponatremia by itself was contributing to it all.  He was very alert and coherent this morning.  As per daughter this afternoon, is sleepy (unsure if tired from working with PT today).  Frequent falls: Multifactorial due to deconditioning, left heel wound, was wheelchair-bound for a while until recently due to the heel wound then only started ambulating recently, alcohol etc.  Daughter states that she is unable to provide the 24/7 supervision or assistance even though she lives next door.  She will speak with patient regarding SNF.  TOC consult  Alcohol dependence: Reports drinking up to 6 beers daily.  No overt withdrawal.  Abstinence counseled.  Continue CIWA protocol.  Hypokalemia: Replaced again today.  Follow BMP in AM.  Magnesium 1.9.  Hypophosphatemia: Due to poor oral intake.  Replaced  Chronic systolic CHF: Compensated.  Holding Lasix for now.  Essential hypertension: Mildly uncontrolled at times.  Continue amlodipine, carvedilol and irbesartan.  As needed IV hydralazine.  Some concern by therapies regarding orthostatic changes and unsteadiness-check orthostatic BPs.  PT to reassess this afternoon for  CAD s/p CABG: No anginal symptoms.  Continue beta-blockers.  PAD s/p bypass: Chronic nonhealing left heel wound but not infected.  Unclear if he is on Xarelto for this indication.  Right second toe laceration: Podiatry input appreciated.  They do not see benefit now off primary closure with sutures since this occurred 2 days prior  to admission.  They indicate no infection.  Patient however had fever up to 102 on 8/21.  Outpatient follow-up with Dr. Daylene Katayama.  S/p tetanus booster in ED. Was on IV Ancef, now switched to Keflex.  Fever: Unclear etiology.  Urine microscopy not consistent with UTI.  Chest x-ray without acute findings.  DC IV antibiotics.  Start oral  Keflex and monitor closely overnight for fever recurrence.  COPD: Stable  Tobacco abuse: Cessation counseled.  Body mass index is 25.73 kg/m.   DVT prophylaxis:   Xarelto   Code Status: Full Code Family Communication: I discussed in detail with patient's daughter via phone, updated care and answered all questions. Disposition:  Status is: Inpatient  The patient will require care spanning > 2 midnights and should be moved to inpatient because: Inpatient level of care appropriate due to severity of illness  Dispo: The patient is from: Home              Anticipated d/c is to: Home              Patient currently is not medically stable to d/c.  Hopeful discharge home 8/24.  OT recommends 24/7 supervision/assistance but patient lives alone.  Will need to discuss with daughter.   Difficult to place patient No        Consultants:   Nephrology Podiatry.  Procedures:   None.  Antimicrobials:    Anti-infectives (From admission, onward)    Start     Dose/Rate Route Frequency Ordered Stop   09/05/20 0700  ceFAZolin (ANCEF) IVPB 2g/100 mL premix        2 g 200 mL/hr over 30 Minutes Intravenous Every 8 hours 09/05/20 0632     09/04/20 1900  ceFAZolin (ANCEF) IVPB 1 g/50 mL premix        1 g 100 mL/hr over 30 Minutes Intravenous  Once 09/04/20 1848 09/04/20 2003         Subjective:  Patient up in chair this morning.  OT had just seen him and advised that he had some orthostatic blood pressure drop but was not sure and also was unsteady and recommended reassessment by therapy this afternoon.  Objective:   Vitals:   09/06/20 0400 09/06/20 0823 09/06/20 0943 09/06/20 1137  BP: (!) 174/71 (!) 145/65 (!) 162/73 123/68  Pulse: 65 77  64  Resp: '15 20  18  '$ Temp: 98.7 F (37.1 C) 98.8 F (37.1 C)  97.8 F (36.6 C)  TempSrc: Oral Oral  Oral  SpO2: 95% 94%  100%  Weight: 68 kg     Height:        General exam: Elderly male, moderately built, frail and chronically ill  looking sitting up comfortably in reclining chair.  Oral mucosa moist. Respiratory system: Clear to auscultation.  No increased work of breathing. Cardiovascular system: S1 and S2 heard, RRR.  No JVD, murmurs or pedal edema.  Telemetry personally reviewed: Sinus rhythm. Gastrointestinal system: Abdomen is nondistended, soft and nontender. No organomegaly or masses felt. Normal bowel sounds heard. Central nervous system: Alert and oriented. No focal neurological deficits. Extremities: Symmetric 5 x 5 power.  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  Laceration over medial aspect of right second toe without bleeding.  Right toe looks bruised.  No clinical features consistent with cellulitis.  Left heel with tiny superficial ulcer with no acute findings. Skin: No rashes, lesions or ulcers Psychiatry: Judgement and insight appear normal. Mood & affect flat.  Data Reviewed:   I have personally reviewed following labs and imaging studies   CBC: Recent Labs  Lab 09/04/20 1712 09/06/20 0541  WBC 9.3 9.1  NEUTROABS 7.9*  --   HGB 13.8 13.1  HCT 36.8* 35.1*  MCV 90.0 91.2  PLT 155 [id number]    Basic Metabolic Panel: Recent Labs  Lab 09/05/20 0318 09/05/20 0635 09/05/20 1651 09/05/20 1735 09/06/20 0541  NA 120*   < > 120* 120* 122*  K 2.7*   < > 3.8 3.7 3.4*  CL 88*   < > 91* 90* 90*  CO2 23   < > 19* 21* 23  GLUCOSE 156*   < > 124* 158* 120*  BUN 5*   < > <5* <5* 5*  CREATININE 0.58*   < > 0.61 0.65 0.68  CALCIUM 8.5*   < > 9.1 9.0 8.9  MG 1.9  --   --   --   --   PHOS 1.2*   < > 2.4* 2.8 2.6   < > = values in this interval not displayed.    Liver Function Tests: Recent Labs  Lab 09/05/20 1651 09/05/20 1735 09/06/20 0541  ALBUMIN 3.3* 3.2* 3.3*    CBG: No results for input(s): GLUCAP in the last 168 hours.  Microbiology Studies:   Recent Results (from the past 240 hour(s))  SARS CORONAVIRUS 2 (TAT 6-24 HRS) Nasopharyngeal Nasopharyngeal Swab      Status: None   Collection Time: 09/04/20  9:22 PM   Specimen: Nasopharyngeal Swab  Result Value Ref Range Status   SARS Coronavirus 2 NEGATIVE NEGATIVE Final    Comment: (NOTE) SARS-CoV-2 target nucleic acids are NOT DETECTED.  The SARS-CoV-2 RNA is generally detectable in upper and lower respiratory specimens during the acute phase of infection. Negative results do not preclude SARS-CoV-2 infection, do not rule out co-infections with other pathogens, and should not be used as the sole basis for treatment or other patient management decisions. Negative results must be combined with clinical observations, patient history, and epidemiological information. The expected result is Negative.  Fact Sheet for Patients: SugarRoll.be  Fact Sheet for Healthcare Providers: https://www.woods-mathews.com/  This test is not yet approved or cleared by the [place] FDA and  has been authorized for detection and/or diagnosis of SARS-CoV-2 by FDA under an Emergency Use Authorization (EUA). This EUA will remain  in effect (meaning this test can be used) for the duration of the COVID-19 declaration under Se ction 564(b)(1) of the Act, 21 U.S.C. section 360bbb-3(b)(1), unless the authorization is terminated or revoked sooner.  Performed at [facility], [place] [address] [place], [place] [address]   Urine Culture     Status: None   Collection Time: 09/05/20  4:36 AM   Specimen: Urine, Clean Catch  Result Value Ref Range Status   Specimen Description URINE, CLEAN CATCH  Final   Special Requests NONE  Final   Culture   Final    NO GROWTH Performed at [facility], [place] [address] [place], [place] [address]    Report Status 09/06/2020 FINAL  Final  Culture, blood (routine x 2)     Status: None (Preliminary result)   Collection Time: 09/05/20  6:14 AM   Specimen: BLOOD RIGHT HAND  Result Value Ref Range Status   Specimen Description BLOOD  RIGHT HAND  Final   Special Requests   Final    BOTTLES DRAWN AEROBIC AND ANAEROBIC Blood Culture results may not be  optimal due to an excessive volume of blood received in culture bottles   Culture   Final    NO GROWTH 1 DAY Performed at [facility], [place] [address], [place], [place] [address]    Report Status PENDING  Incomplete  Culture, blood (routine x 2)     Status: None (Preliminary result)   Collection Time: 09/05/20  6:15 AM   Specimen: BLOOD LEFT HAND  Result Value Ref Range Status   Specimen Description BLOOD LEFT HAND  Final   Special Requests   Final    BOTTLES DRAWN AEROBIC AND ANAEROBIC Blood Culture results may not be optimal due to an excessive volume of blood received in culture bottles   Culture   Final    NO GROWTH 1 DAY Performed at [facility], [place] [address] [place], [place] [address]    Report Status PENDING  Incomplete     Radiology Studies:  CT HEAD WO CONTRAST (5MM)  Result Date: 09/04/2020 CLINICAL DATA:  Head trauma EXAM: CT HEAD WITHOUT CONTRAST TECHNIQUE: Contiguous axial images were obtained from the base of the skull through the vertex without intravenous contrast. COMPARISON:  None. FINDINGS: Brain: There is atrophy and chronic small vessel disease changes. No acute intracranial abnormality. Specifically, no hemorrhage, hydrocephalus, mass lesion, acute infarction, or significant intracranial injury. Chronic appearing lacunar infarct in the left thalamus. Vascular: No hyperdense vessel or unexpected calcification. Skull: No acute calvarial abnormality. Sinuses/Orbits: No acute findings Other: No IMPRESSION: Old left thalamic lacunar infarct. Atrophy, chronic microvascular disease. No acute intracranial abnormality. Electronically Signed   By: Rolm Baptise M.D.   On: 09/04/2020 20:53   DG CHEST PORT 1 VIEW  Result Date: 09/05/2020 CLINICAL DATA:  Fever EXAM: PORTABLE CHEST 1 VIEW COMPARISON:  08/11/2019 FINDINGS: Cardiomegaly. Prior CABG.  Large lung volumes with diaphragm flattening. There is no edema, consolidation, effusion, or pneumothorax. Remote left more than right rib fractures with healed deformity. IMPRESSION: COPD without acute superimposed finding. Electronically Signed   By: Monte Fantasia M.D.   On: 09/05/2020 04:44     Scheduled Meds:    amLODipine  5 mg Oral Daily   aspirin EC  81 mg Oral Daily   atorvastatin  80 mg Oral Daily   carvedilol  25 mg Oral BID WC   cholecalciferol  1,000 Units Oral Q breakfast   DULoxetine  60 mg Oral Daily   ferrous sulfate  325 mg Oral Q breakfast   folic acid  1 mg Oral Daily   gabapentin  300 mg Oral TID   irbesartan  300 mg Oral Daily   pantoprazole  40 mg Oral Daily   rivaroxaban  2.5 mg Oral BID   thiamine  100 mg Oral Daily   vitamin B-12  1,000 mcg Oral Q breakfast    Continuous Infusions:     ceFAZolin (ANCEF) IV 2 g (09/06/20 1436)     LOS: 1 day     Vernell Leep, MD, [place], [facility]. [facility]    To contact the attending provider between 7A-7P or the covering provider during after hours 7P-7A, please log into the web site [email or web address] and access using universal [place] password for that web site. If you do not have the password, please call the hospital operator.  09/06/2020, 3:22 PM

## 2020-09-06 NOTE — Progress Notes (Signed)
Pt and pt family are requesting help to find a rehab place after discharge

## 2020-09-06 NOTE — Evaluation (Signed)
Occupational Therapy Evaluation Patient Details Name: Caleb [name] MRN: [id number] DOB: [birth year] Today's Date: 09/06/2020    History of Present Illness Pt is a 70yo male presenting to [facility] ED on 8/21 with complaints of fall and associated right second toe injury. Also found to have acute hyponatremia. PMH: s/p right great toe removal, COPD, CHF, HTN, s/p CABGx3.   Clinical Impression   Pt reports they live in a mobile home with their daughter visiting almost every day and they do not go out often with walker or cane. Pt at this time requires min guard to supervision due to safety.  Pt currently with functional limitations due to the deficits listed below (see OT Problem List).  Pt will benefit from skilled OT to increase their safety and independence with ADL and functional mobility for ADL to facilitate discharge to venue listed below.      Follow Up Recommendations  Home health OT;Supervision/Assistance - 24 hour    Equipment Recommendations   (grab bars in shower)    Recommendations for Other Services       Precautions / Restrictions Precautions Precautions: Fall Precaution Comments: Recent fall while carrying clothes from [place]. Pt reports other falls in the past two months that were "less bad." Restrictions Weight Bearing Restrictions: No      Mobility Bed Mobility Overal bed mobility: Needs Assistance Bed Mobility: Supine to Sit;Sit to Supine     Supine to sit: Supervision Sit to supine: Supervision   General bed mobility comments: Pt on a flat bed    Transfers Overall transfer level: Needs assistance Equipment used: Rolling walker (2 wheeled) Transfers: Sit to/from Stand Sit to Stand: Supervision         General transfer comment: Pt requires cues for saftey    Balance Overall balance assessment: Needs assistance Sitting-balance support: Feet supported Sitting balance-Leahy Scale: Fair                                     ADL either  performed or assessed with clinical judgement   ADL Overall ADL's : Needs assistance/impaired Eating/Feeding: Independent;Sitting   Grooming: Wash/dry hands;Wash/dry face;Supervision/safety;Cueing for sequencing;Sitting   Upper Body Bathing: Supervision/ safety;Cueing for safety;Cueing for sequencing;Sitting   Lower Body Bathing: Supervison/ safety;Cueing for safety;Cueing for sequencing;Sit to/from stand   Upper Body Dressing : Supervision/safety;Cueing for safety;Cueing for sequencing;Sitting   Lower Body Dressing: Cueing for safety;Cueing for sequencing;Sit to/from stand;Min guard   Toilet Transfer: Supervision/safety;Cueing for safety;Cueing for sequencing   Toileting- Clothing Manipulation and Hygiene: Supervision/safety;Cueing for safety;Cueing for sequencing;Sit to/from stand   Tub/ Banker: Min guard   Functional mobility during ADLs: Min guard;Cueing for safety;Cueing for sequencing;Rolling walker       Vision Baseline Vision/History: 1 Wears glasses Patient Visual Report: No change from baseline Vision Assessment?: No apparent visual deficits     Perception     Praxis      Pertinent Vitals/Pain Pain Assessment: No/denies pain     Hand Dominance Right   Extremity/Trunk Assessment Upper Extremity Assessment Upper Extremity Assessment: RUE deficits/detail;LUE deficits/detail RUE Deficits / Details: Gross strength 4/5 RUE Sensation: decreased light touch LUE Deficits / Details: Gross strength 4/5 LUE Sensation: decreased light touch   Lower Extremity Assessment Lower Extremity Assessment: Defer to PT evaluation (decrease sensations)   Cervical / Trunk Assessment Cervical / Trunk Assessment: Kyphotic   Communication Communication Communication: No difficulties   Cognition Arousal/Alertness: Awake/alert  Behavior During Therapy: WFL for tasks assessed/performed;Flat affect Overall Cognitive Status: Within Functional Limits for tasks assessed                                  General Comments: answered all questions appropriatly   General Comments  monitor BP    Exercises     Shoulder Instructions      Home Living Family/patient expects to be discharged to:: Private residence Living Arrangements: Alone Available Help at Discharge: Family;Available PRN/intermittently Type of Home: Mobile home Home Access: Stairs to enter Entrance Stairs-Number of Steps: 4 Entrance Stairs-Rails: Right Home Layout: One level     Bathroom Shower/Tub: Tub/shower unit (glass frame)   Bathroom Toilet: Standard Bathroom Accessibility: Yes   Home Equipment: Cane - single point;Wheelchair - [employer] - 2 wheels          Prior Functioning/Environment Level of Independence: Needs assistance  Gait / Transfers Assistance Needed: Uses RW to ambulate at home. Reports he uses WC as a chair to sit in, but does not use for mobility. Uses cane to get to and from the car. ADL's / Homemaking Assistance Needed: Independent with ADLs, daughter goes to grocery store for him.   Comments: Pt reports he has stopped driving since his right great toe amptuation.        OT Problem List: Decreased activity tolerance;Decreased strength;Decreased safety awareness;Cardiopulmonary status limiting activity      OT Treatment/Interventions: Self-care/ADL training;Therapeutic exercise;DME and/or AE instruction;Therapeutic activities;Patient/family education;Balance training    OT Goals(Current goals can be found in the care plan section) Acute Rehab OT Goals Patient Stated Goal: to go home OT Goal Formulation: With patient Time For Goal Achievement: 09/17/20 Potential to Achieve Goals: Good ADL Goals Pt Will Perform Lower Body Bathing: with modified independence;sit to/from stand Pt Will Perform Tub/Shower Transfer: with modified independence;Tub transfer;shower seat;grab bars Additional ADL Goal #1: Pt will complete standing task with no LOB  or lateral leaning while use of DME as needed  OT Frequency: Min 2X/week   Barriers to D/C:            Co-evaluation              AM-PAC OT "6 Clicks" Daily Activity     Outcome Measure Help from another person eating meals?: None Help from another person taking care of personal grooming?: A Little Help from another person toileting, which includes using toliet, bedpan, or urinal?: A Little Help from another person bathing (including washing, rinsing, drying)?: A Little Help from another person to put on and taking off regular upper body clothing?: None Help from another person to put on and taking off regular lower body clothing?: A Little 6 Click Score: 20   End of Session Equipment Utilized During Treatment: Gait belt;Rolling walker Nurse Communication: Mobility status;Other (comment) (BP)  Activity Tolerance: Patient tolerated treatment well Patient left: in chair;with call bell/phone within reach;with chair alarm set  OT Visit Diagnosis: Unsteadiness on feet (R26.81);Other abnormalities of gait and mobility (R26.89);Repeated falls (R29.6);Muscle weakness (generalized) (M62.81);History of falling (Z91.81)                Time: [id number] OT Time Calculation (min): 43 min Charges:  OT General Charges $OT Visit: 1 Visit OT Evaluation $OT Eval Low Complexity: 1 Low OT Treatments $Self Care/Home Management : 23-37 mins  Joeseph Amor OTR/L  Acute Rehab Services  [phone] office number [phone] pager number  Joeseph Amor 09/06/2020, 8:40 AM

## 2020-09-07 DIAGNOSIS — R296 Repeated falls: Secondary | ICD-10-CM | POA: Diagnosis not present

## 2020-09-07 DIAGNOSIS — E871 Hypo-osmolality and hyponatremia: Secondary | ICD-10-CM | POA: Diagnosis not present

## 2020-09-07 LAB — RENAL FUNCTION PANEL
Albumin: 3.3 g/dL — ABNORMAL LOW (ref 3.5–5.0)
Anion gap: 9 (ref 5–15)
BUN: 12 mg/dL (ref 8–23)
CO2: 23 mmol/L (ref 22–32)
Calcium: 9.1 mg/dL (ref 8.9–10.3)
Chloride: 92 mmol/L — ABNORMAL LOW (ref 98–111)
Creatinine, Ser: 0.99 mg/dL (ref 0.61–1.24)
GFR, Estimated: >60 mL/min (ref >60)
Glucose, Bld: 115 mg/dL — ABNORMAL HIGH (ref 70–99)
Phosphorus: 2 mg/dL — ABNORMAL LOW (ref 2.5–4.6)
Potassium: 4.2 mmol/L (ref 3.5–5.1)
Sodium: 124 mmol/L — ABNORMAL LOW (ref 135–145)

## 2020-09-07 LAB — SODIUM: Sodium: 125 mmol/L — ABNORMAL LOW (ref 135–145)

## 2020-09-07 LAB — MAGNESIUM: Magnesium: 1.9 mg/dL (ref 1.7–2.4)

## 2020-09-07 MED ORDER — POTASSIUM PHOSPHATES 15 MMOLE/5ML IV SOLN
15.0000 mmol | Freq: Once | INTRAVENOUS | Status: AC
  Administered 2020-09-07: 15 mmol via INTRAVENOUS
  Filled 2020-09-07: qty 5

## 2020-09-07 NOTE — TOC Initial Note (Signed)
Transition of Care [facility]) - Initial/Assessment Note    Patient Details  Name: Caleb [name] MRN: [id number] Date of Birth: 24-Dec-1950  Transition of Care [facility]) CM/SW Contact:    Trula Ore, [place] Phone Number: 09/07/2020, 4:36 PM  Clinical Narrative:                   CSW received consult for possible SNF placement at time of discharge. CSW spoke with patients daughter Levada Dy regarding PT recommendation of SNF placement at time of discharge. Patients daughter reports patient comes from home alone. Patient expressed understanding of PT recommendation and is agreeable to SNF placement at time of discharge. Patients daughter gave CSW permission to fax out initial referral near the [place], [place] area. Patient has not received the COVID vaccines. No further questions reported at this time. CSW to continue to follow and assist with discharge planning needs.   Expected Discharge Plan: Skilled Nursing Facility Barriers to Discharge: Continued Medical Work up   Patient Goals and CMS Choice Patient states their goals for this hospitalization and ongoing recovery are:: to return home CMS Medicare.gov Compare Post Acute Care list provided to:: Patient Represenative (must comment) (patients daughter Levada Dy) Choice offered to / list presented to : Adult Children (Patients daughter Levada Dy)  Expected Discharge Plan and Services Expected Discharge Plan: [place] In-house Referral: Clinical Social Work Discharge Planning Services: CM Consult Post Acute Care Choice: [place] arrangements for the past 2 months: [place]                 DME Arranged: N/A         HH Arranged: PT HH Agency: [facility] Date [place]: 09/05/20 Time [place]: [place] Representative spoke with at [place]: Tommi Rumps  Prior Living Arrangements/Services Living arrangements for the past 2 months: [place]  with:: Self Patient language and need for interpreter reviewed:: Yes Do you feel safe going back to the place where you live?: No   SNF  Need for Family Participation in Patient Care: Yes (Comment) Care giver support system in place?: Yes (comment)   Criminal Activity/Legal Involvement Pertinent to Current Situation/Hospitalization: No - Comment as needed  Activities of Daily Living Home Assistive Devices/Equipment: Eyeglasses, Cane (specify quad or straight), Walker (specify type), Wheelchair ADL Screening (condition at time of admission) Patient's cognitive ability adequate to safely complete daily activities?: Yes Is the patient deaf or have difficulty hearing?: No Does the patient have difficulty seeing, even when wearing glasses/contacts?: No Does the patient have difficulty concentrating, remembering, or making decisions?: No Patient able to express need for assistance with ADLs?: Yes Does the patient have difficulty dressing or bathing?: No Independently performs ADLs?: Yes (appropriate for developmental age) Does the patient have difficulty walking or climbing stairs?: Yes Weakness of Legs: Both Weakness of Arms/Hands: None  Permission Sought/Granted Permission sought to share information with : Case Manager, Family Supports, Customer service manager Permission granted to share information with : Yes, Verbal Permission Granted  Share Information with NAME: Levada Dy  Permission granted to share info w AGENCY: SNF  Permission granted to share info w Relationship: daughter  Permission granted to share info w Contact Information: Levada Dy [phone]  Emotional Assessment Appearance:: Appears stated age Attitude/Demeanor/Rapport: Engaged Affect (typically observed): Appropriate Orientation: : Oriented to Self, Oriented to Place, Oriented to  Time, Oriented to Situation (WDL) Alcohol / Substance Use: Not Applicable Psych Involvement: No (comment)  Admission diagnosis:   Acute hyponatremia [E87.1] Hyponatremia [E87.1] Laceration of toe without foreign body present or damage to nail, unspecified laterality, unspecified toe, initial encounter [S91.119A] Contusion of toe with damage to nail, unspecified laterality, unspecified toe, initial encounter [S90.229A] Patient Active Problem List   Diagnosis Date Noted   Hyponatremia 09/05/2020   Spinal stenosis of cervical region 04/21/2020   Monoclonal gammopathy 04/13/2020   SIRS (systemic inflammatory response syndrome) ([place]) 08/07/2019   Demand ischemia ([place])    Gastritis with hemorrhage 02/03/2019   PVD (peripheral vascular disease) ([place]) 02/03/2019   Chronic anticoagulation 02/03/2019   Acute kidney injury ([place]) 08/14/2018   Iron deficiency anemia 08/14/2018   Acute hyponatremia 08/13/2018   Renal artery stenosis ([place]) 10/22/2017   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 [place] [place] [address] Phone: [phone] Fax: [facility] - [place], [place] [address] Phone: [phone] Fax: [phone]     Social Determinants of Health (SDOH) Interventions    Readmission Risk Interventions No flowsheet data found.

## 2020-09-07 NOTE — Progress Notes (Signed)
Admit: 09/04/2020 LOS: 2  46M AoC hypotnic euvolemic hyponatremia, ASx, probably is low solute related  Subjective:  SNa up to 124 NO HA, vision changes, confusion Adequate UOP   08/23 0701 - 08/24 0700 In: 600 [P.O.:600] Out: 950 [Urine:950]  Filed Weights   09/05/20 0054 09/06/20 0400 09/07/20 0424  Weight: 67.3 kg 68 kg 66.2 kg    Scheduled Meds:  amLODipine  5 mg Oral Daily   aspirin EC  81 mg Oral Daily   atorvastatin  80 mg Oral Daily   carvedilol  25 mg Oral BID WC   cephALEXin  500 mg Oral Q8H   cholecalciferol  1,000 Units Oral Q breakfast   ferrous sulfate  325 mg Oral Q breakfast   folic acid  1 mg Oral Daily   gabapentin  300 mg Oral TID   irbesartan  300 mg Oral Daily   pantoprazole  40 mg Oral Daily   rivaroxaban  2.5 mg Oral BID   thiamine  100 mg Oral Daily   vitamin B-12  1,000 mcg Oral Q breakfast   Continuous Infusions:  potassium PHOSPHATE IVPB (in mmol) 15 mmol (09/07/20 0810)    PRN Meds:.acetaminophen **OR** acetaminophen, albuterol, hydrALAZINE, nitroGLYCERIN  Current Labs: reviewed  Results for CORNELUIS, BOEDEKER (MRN [id number]) as of 09/05/2020 14:46  Ref. Range 09/04/2020 17:13  Osmolality, Urine Latest Ref Range: 300 - 900 mOsm/kg 241 (L)  Results for NEILL, ROWAN (MRN [id number]) as of 09/05/2020 14:46  Ref. Range 09/05/2020 04:40  Sodium, Urine Latest Units: mmol/L 91  Results for TAJMIR, GLOMB (MRN [id number]) as of 09/05/2020 14:46  Ref. Range 09/04/2020 17:12  Osmolality Latest Ref Range: 275 - 295 mOsm/kg 245 (LL)    Physical Exam:  Blood pressure (!) 114/53, pulse 67, temperature 98.2 F (36.8 C), temperature source Oral, resp. rate 18, height '5\' 4"'$  (1.626 m), weight 66.2 kg, SpO2 98 %. NAD, chronically ill appearing RRR CTAB No [place] Nonfocal, AAO x3 S/nt/nd  A AoC hypotonic euvolemic hyponatremia: I think this is primarily low solute driven leading  impaired free water excretion. (Low P and BUN support this) UNa is higher than would be  expected.  It did improve with IVFs which is not consistent with SIADH.  Cont to push solute, limit fliuds (1.2L total daily input); stopped cymbalta as well given little improvement in his pain syndrome and its possibility to contribute, but his hyponatremia preexisted use of Cymbalta. R 2nd toe laceration, podatry following Hypophosphatemia, likely poor PO, repleted;  Heavy ETOH use Hypokalemia, improved with repletion Hx/o CHF, not decompensated HTN  P Trend sodium twice daily Probably okay for discharge once sodium greater than 125 Cont to push PO, esp protein Will cont to follow  [name] 09/07/2020, 1:02 PM  Recent Labs  Lab 09/05/20 1735 09/06/20 0541 09/06/20 1603 09/07/20 0210  NA 120* 122* 123* 124*  K 3.7 3.4*  --  4.2  CL 90* 90*  --  92*  CO2 21* 23  --  23  GLUCOSE 158* 120*  --  115*  BUN <5* 5*  --  12  CREATININE 0.65 0.68  --  0.99  CALCIUM 9.0 8.9  --  9.1  PHOS 2.8 2.6  --  2.0*    Recent Labs  Lab 09/04/20 1712 09/06/20 0541  WBC 9.3 9.1  NEUTROABS 7.9*  --   HGB 13.8 13.1  HCT 36.8* 35.1*  MCV 90.0 91.2  PLT 155 163

## 2020-09-07 NOTE — Progress Notes (Signed)
PROGRESS NOTE   Caleb [name]  [id number]    DOB: 31-Dec-1950    DOA: 09/04/2020  PCP: Percell Belt, DO   I have briefly reviewed patients previous medical records in [facility].  Chief Complaint  Patient presents with   Toe Pain    Brief Narrative:  70 year old male with PMH of CAD s/p CABG, chronic systolic CHF with last EF 45-50% and grade 2 diastolic dysfunction in July 2021, PAD s/p femoropopliteal bypass, ongoing tobacco abuse, alcohol use disorder, COPD, chronic hyponatremia, was referred to the ED after patient's recent serum sodium done as outpatient 2 days prior to admission was 114.  He reported feeling weak at times and confused as per patient's daughter.  He also recently hit his leg while taking laundry out of the washing machine and had falls without head injury or LOC.  Recently his Lasix dose was increased due to leg swelling.  In ED, lab work showed serum sodium of 116, potassium 3, serum osmolarity 245, urine osmolarity 241, CT head unremarkable, x-ray of right foot negative for fracture.  Nephrology consulted, briefly on IV saline hydration, serum sodium improved but plateaued in the 120-122 range.  Continuing to monitor closely with plans to discharge when serum sodium 125 and above.   Assessment & Plan:  Principal Problem:   Acute hyponatremia Active Problems:   COPD (chronic obstructive pulmonary disease) (HCC)   Essential hypertension   S/P CABG x 3   Chronic anticoagulation   Hyponatremia   Subacute on chronic hyponatremia: Likely multifactorial related to poor oral intake, beer intake, recent increase in Lasix.  Nephrology input appreciated.  Briefly on IV saline hydration, BMP was closely monitored.  Picture not consistent with SIADH. Encourage oral intake.  Per nephrology, salt tablets do not help.  Cymbalta which had been recently started outpatient was [facility] 8/23.  AMS resolved.  Serum sodium has improved and plateaued in the low 120 range.  It  appears that his baseline is in the mid 120s.  As discussed with Dr. Joelyn Oms, continue to monitor BMP closely and DC when serum sodium 125 or greater.  Acute metabolic encephalopathy: Resolved.  May be multifactorial.  Unsure if hyponatremia by itself was contributing to it all.    Frequent falls: Multifactorial due to deconditioning, left heel wound, was wheelchair-bound for a while until recently due to the heel wound then only started ambulating recently, alcohol etc.  Daughter states that she is unable to provide the 24/7 supervision or assistance even though she lives next door.  Patient agreeable for SNF.  TOC consulted.  Alcohol dependence: Reports drinking up to 6 beers daily.  No overt withdrawal.  Abstinence counseled.  Continue CIWA protocol.  Hypokalemia: Replaced.  Magnesium 1.9.  Hypophosphatemia: Due to poor oral intake.  Recurrent.  Replace again and follow.  Chronic systolic CHF: Compensated.  Holding Lasix for now-nephrology to advise regarding timing of resumption of.  Essential hypertension/orthostatic hypotension: Mildly uncontrolled at times/labile.  Continue amlodipine, carvedilol and irbesartan.  As needed IV hydralazine.  Significant orthostatic changes (supine BP 152/69, sitting 129/58, standing 101/78)-possibly contributing to falls.  No syncope.  Euvolemic.  Added TED hoses.  CAD s/p CABG: No anginal symptoms.  Continue beta-blockers.  PAD s/p bypass: Chronic nonhealing left heel wound but not infected.  Unclear if he is on Xarelto for this indication.  Right second toe laceration: Podiatry input appreciated.  They do not see benefit now off primary closure with sutures since this occurred 2  days prior to admission.  They indicate no infection.  Patient however had fever up to 102 on 8/21.  Outpatient follow-up with Dr. Daylene Katayama.  S/p tetanus booster in ED. Was on IV Ancef, now switched to Keflex.  Fever: Unclear etiology.  Urine microscopy not consistent with  UTI.  Chest x-ray without acute findings.  DC IV antibiotics.  Start oral Keflex and monitor closely overnight for fever recurrence.  Blood cultures: NTD.  Urine culture negative.  COPD: Stable  Tobacco abuse: Cessation counseled.  Body mass index is 25.05 kg/m./Suspect severe protein calorie malnutrition: Due to poor oral intake.  Dietitian consulted.   DVT prophylaxis:   Xarelto   Code Status: Full Code Family Communication: I discussed in detail with patient's daughter via phone 8/23.  None at bedside today Disposition:  Status is: Inpatient  The patient will require care spanning > 2 midnights and should be moved to inpatient because: Inpatient level of care appropriate due to severity of illness  Dispo: The patient is from: Home              Anticipated d/c is to: SNF              Patient currently is not medically stable to d/c.     Difficult to place patient No        Consultants:   Nephrology Podiatry.  Procedures:   None.  Antimicrobials:    Anti-infectives (From admission, onward)    Start     Dose/Rate Route Frequency Ordered Stop   09/06/20 2200  cephALEXin (KEFLEX) capsule 500 mg        500 mg Oral Every 8 hours 09/06/20 1545     09/05/20 0700  ceFAZolin (ANCEF) IVPB 2g/100 mL premix  Status:  Discontinued        2 g 200 mL/hr over 30 Minutes Intravenous Every 8 hours 09/05/20 0632 09/06/20 1545   09/04/20 1900  ceFAZolin (ANCEF) IVPB 1 g/50 mL premix        1 g 100 mL/hr over 30 Minutes Intravenous  Once 09/04/20 1848 09/04/20 2003         Subjective:  Denies complaints.  No dizziness or lightheadedness.  No pain reported.  Objective:   Vitals:   09/07/20 0424 09/07/20 0515 09/07/20 0700 09/07/20 1100  BP: (!) 174/62 (!) 158/63 (!) 172/76 (!) 114/53  Pulse: 71 77 69 67  Resp: '16  17 18  '$ Temp: 98.3 F (36.8 C)  98.3 F (36.8 C) 98.2 F (36.8 C)  TempSrc: Oral  Oral Oral  SpO2: 97%  95% 98%  Weight: 66.2 kg     Height:         General exam: Elderly male, moderately built, frail and chronically ill looking sitting up comfortably in reclining chair.  Oral mucosa moist. Respiratory system: Clear to auscultation.  No increased work of breathing. Cardiovascular system: S1 and S2 heard, RRR.  No JVD, murmurs or pedal edema. Gastrointestinal system: Abdomen is nondistended, soft and nontender. No organomegaly or masses felt. Normal bowel sounds heard. Central nervous system: Alert and oriented.  No focal neurological deficits. Extremities: Symmetric 5 x 5 power.  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  Laceration over medial aspect of right second toe without bleeding.  Right toe looks bruised.  No clinical features consistent with cellulitis.  Left heel with tiny superficial ulcer with no acute findings. Skin: No rashes, lesions or ulcers Psychiatry: Judgement and insight appear normal.  Mood & affect flat.     Data Reviewed:   I have personally reviewed following labs and imaging studies   CBC: Recent Labs  Lab 09/04/20 1712 09/06/20 0541  WBC 9.3 9.1  NEUTROABS 7.9*  --   HGB 13.8 13.1  HCT 36.8* 35.1*  MCV 90.0 91.2  PLT 155 [id number]    Basic Metabolic Panel: Recent Labs  Lab 09/05/20 0318 09/05/20 0635 09/05/20 1735 09/06/20 0541 09/06/20 1603 09/07/20 0210  NA 120*   < > 120* 122* 123* 124*  K 2.7*   < > 3.7 3.4*  --  4.2  CL 88*   < > 90* 90*  --  92*  CO2 23   < > 21* 23  --  23  GLUCOSE 156*   < > 158* 120*  --  115*  BUN 5*   < > <5* 5*  --  12  CREATININE 0.58*   < > 0.65 0.68  --  0.99  CALCIUM 8.5*   < > 9.0 8.9  --  9.1  MG 1.9  --   --   --   --  1.9  PHOS 1.2*   < > 2.8 2.6  --  2.0*   < > = values in this interval not displayed.    Liver Function Tests: Recent Labs  Lab 09/05/20 1735 09/06/20 0541 09/07/20 0210  ALBUMIN 3.2* 3.3* 3.3*    CBG: No results for input(s): GLUCAP in the last 168 hours.  Microbiology Studies:   Recent Results (from the  past 240 hour(s))  SARS CORONAVIRUS 2 (TAT 6-24 HRS) Nasopharyngeal Nasopharyngeal Swab     Status: None   Collection Time: 09/04/20  9:22 PM   Specimen: Nasopharyngeal Swab  Result Value Ref Range Status   SARS Coronavirus 2 NEGATIVE NEGATIVE Final    Comment: (NOTE) SARS-CoV-2 target nucleic acids are NOT DETECTED.  The SARS-CoV-2 RNA is generally detectable in upper and lower respiratory specimens during the acute phase of infection. Negative results do not preclude SARS-CoV-2 infection, do not rule out co-infections with other pathogens, and should not be used as the sole basis for treatment or other patient management decisions. Negative results must be combined with clinical observations, patient history, and epidemiological information. The expected result is Negative.  Fact Sheet for Patients: SugarRoll.be  Fact Sheet for Healthcare Providers: https://www.woods-mathews.com/  This test is not yet approved or cleared by the [place] FDA and  has been authorized for detection and/or diagnosis of SARS-CoV-2 by FDA under an Emergency Use Authorization (EUA). This EUA will remain  in effect (meaning this test can be used) for the duration of the COVID-19 declaration under Se ction 564(b)(1) of the Act, 21 U.S.C. section 360bbb-3(b)(1), unless the authorization is terminated or revoked sooner.  Performed at [facility], [place] [address], [place], [place] [address]   Urine Culture     Status: None   Collection Time: 09/05/20  4:36 AM   Specimen: Urine, Clean Catch  Result Value Ref Range Status   Specimen Description URINE, CLEAN CATCH  Final   Special Requests NONE  Final   Culture   Final    NO GROWTH Performed at [facility], [place] [address], [place], [place] [address]    Report Status 09/06/2020 FINAL  Final  Culture, blood (routine x 2)     Status: None (Preliminary result)   Collection Time: 09/05/20  6:14 AM    Specimen: BLOOD RIGHT HAND  Result Value Ref Range Status   Specimen Description BLOOD RIGHT HAND  Final   Special Requests   Final    BOTTLES DRAWN AEROBIC AND ANAEROBIC Blood Culture results may not be optimal due to an excessive volume of blood received in culture bottles   Culture   Final    NO GROWTH 2 DAYS Performed at [place] [address] [place], [place] [address]    Report Status PENDING  Incomplete  Culture, blood (routine x 2)     Status: None (Preliminary result)   Collection Time: 09/05/20  6:15 AM   Specimen: BLOOD LEFT HAND  Result Value Ref Range Status   Specimen Description BLOOD LEFT HAND  Final   Special Requests   Final    BOTTLES DRAWN AEROBIC AND ANAEROBIC Blood Culture results may not be optimal due to an excessive volume of blood received in culture bottles   Culture   Final    NO GROWTH 2 DAYS Performed at [facility], [place] [address] [place], [place] [address]    Report Status PENDING  Incomplete     Radiology Studies:  No results found.   Scheduled Meds:    amLODipine  5 mg Oral Daily   aspirin EC  81 mg Oral Daily   atorvastatin  80 mg Oral Daily   carvedilol  25 mg Oral BID WC   cephALEXin  500 mg Oral Q8H   cholecalciferol  1,000 Units Oral Q breakfast   ferrous sulfate  325 mg Oral Q breakfast   folic acid  1 mg Oral Daily   gabapentin  300 mg Oral TID   irbesartan  300 mg Oral Daily   pantoprazole  40 mg Oral Daily   rivaroxaban  2.5 mg Oral BID   thiamine  100 mg Oral Daily   vitamin B-12  1,000 mcg Oral Q breakfast    Continuous Infusions:    potassium PHOSPHATE IVPB (in mmol) 15 mmol (09/07/20 0810)     LOS: 2 days     Vernell Leep, MD, [place], [facility]. [facility]    To contact the attending provider between 7A-7P or the covering provider during after hours 7P-7A, please log into the web site [email or web address] and access using universal [place] password for that web site. If you do not have  the password, please call the hospital operator.  09/07/2020, 12:46 PM

## 2020-09-07 NOTE — Progress Notes (Signed)
Physical Therapy Treatment Patient Details Name: Caleb [name] MRN: [id number] DOB: 23-May-1950 Today's Date: 09/07/2020    History of Present Illness Pt is a 69yo male presenting to [facility] ED on 8/21 with complaints of fall and associated right second toe injury. Also found to have acute hyponatremia. PMH: s/p right great toe removal, COPD, CHF, HTN, s/p CABGx3.    PT Comments    Pt sound asleep on entry requiring increased verbal and tactile stimulation to rouse, however once awake able to participate in therapy. Pt safe mobility is limited mainly by decreased balance with especially with dynamic activities, pt has increased posterior lean and requires UE support to maintain balance. In addition, pt has generalized weakness and decreased endurance. Pt is currently min guard for transfers and generally min guard for ambulation, however experiences LoB requiring mod A for recovery. PT recommending SNF level rehab to improve balance prior to return home. PT will continue to follow acutely.     Follow Up Recommendations  SNF     Equipment Recommendations  None recommended by PT       Precautions / Restrictions Precautions Precautions: Fall Precaution Comments: Recent fall while carrying clothes from [place]. Pt reports other falls in the past two months that were "less bad." Restrictions Weight Bearing Restrictions: No    Mobility  Bed Mobility               General bed mobility comments: pt in chair at arrival, returned to chair    Transfers Overall transfer level: Needs assistance Equipment used: Rolling walker (2 wheeled) Transfers: Sit to/from Stand Sit to Stand: Min guard         General transfer comment: min guard for safety, although pt with posterior lean and reliance on posterior LE against recliner to steady,  Ambulation/Gait Ambulation/Gait assistance: Min guard;Mod assist Gait Distance (Feet): 100 Feet Assistive device: Rolling walker (2 wheeled) Gait  Pattern/deviations: Decreased step length - right;Step-through pattern;Decreased step length - left Gait velocity: decreased Gait velocity interpretation: <1.31 ft/sec, indicative of household ambulator General Gait Details: Pt is generally min guard for ambulation, however experiences LoB with turning requiring modA for steadying due to posterior LoB. Pt continues to have external rotation of R hip and increased weightbearing through R heel to decrease R forefoot pain.      Balance Overall balance assessment: Needs assistance Sitting-balance support: Feet supported Sitting balance-Leahy Scale: Fair     Standing balance support: Bilateral upper extremity supported Standing balance-Leahy Scale: Poor Standing balance comment: pt with increased posterior lean in standing, requires at least single UE support for balance.                            Cognition Arousal/Alertness: Awake/alert Behavior During Therapy: WFL for tasks assessed/performed;Flat affect Overall Cognitive Status: Within Functional Limits for tasks assessed                                           General Comments General comments (skin integrity, edema, etc.): VSS on RA      Pertinent Vitals/Pain Pain Assessment: 0-10 Pain Score: 2  Pain Location: R foot Pain Descriptors / Indicators: Aching;Sore Pain Intervention(s): Limited activity within patient's tolerance;Monitored during session;Repositioned     PT Goals (current goals can now be found in the care plan section) Acute Rehab PT Goals  Patient Stated Goal: to go home PT Goal Formulation: With patient Time For Goal Achievement: 09/19/20 Potential to Achieve Goals: Good Progress towards PT goals: Progressing toward goals    Frequency    Min 3X/week      PT Plan Current plan remains appropriate       AM-PAC PT "6 Clicks" Mobility   Outcome Measure  Help needed turning from your back to your side while in a flat  bed without using bedrails?: A Little Help needed moving from lying on your back to sitting on the side of a flat bed without using bedrails?: A Little Help needed moving to and from a bed to a chair (including a wheelchair)?: A Little Help needed standing up from a chair using your arms (e.g., wheelchair or bedside chair)?: A Little Help needed to walk in hospital room?: A Lot Help needed climbing 3-5 steps with a railing? : A Lot 6 Click Score: 16    End of Session Equipment Utilized During Treatment: Gait belt Activity Tolerance: Patient tolerated treatment well Patient left: in chair;with call bell/phone within reach;with chair alarm set Nurse Communication: Mobility status PT Visit Diagnosis: Unsteadiness on feet (R26.81);History of falling (Z91.81);Muscle weakness (generalized) (M62.81);Difficulty in walking, not elsewhere classified (R26.2)     Time: [id number] PT Time Calculation (min) (ACUTE ONLY): 20 min  Charges:  $Gait Training: 8-22 mins                     Nandita Mathenia B. Migdalia Dk PT, DPT Acute Rehabilitation Services Pager [phone] Office [phone]    [place] 09/07/2020, 1:29 PM

## 2020-09-07 NOTE — Progress Notes (Signed)
Occupational Therapy Treatment Patient Details Name: Caleb [name] MRN: [id number] DOB: 28-Mar-1950 Today's Date: 09/07/2020    History of present illness Pt is a 70yo male presenting to [facility] ED on 8/21 with complaints of fall and associated right second toe injury. Also found to have acute hyponatremia. PMH: s/p right great toe removal, COPD, CHF, HTN, s/p CABGx3.   OT comments  Caleb [name] is progressing towards his OT goals well. His daughter has concerns of him discharging home alone due to safety concerns and inability to provide 24/7 support at d/c. Caleb [name] completed sit<>stand with min A due to posterior bias upon standing and required physical assist to gain balance. While ambulating in his room, grooming at the sink and completing toilet transfer with RW he maintained min guard A for safety and tolerated about 5 minutes of functional tasks prior to sitting. Pt would benefit from post-acute rehab at SNF prior to d/c home to gain mod I balance, strength and safety in all functional tasks.    Follow Up Recommendations  Supervision/Assistance - 24 hour;SNF          Precautions / Restrictions Precautions Precautions: Fall Precaution Comments: Recent fall while carrying clothes from [place]. Pt reports other falls in the past two months that were "less bad." Restrictions Weight Bearing Restrictions: No       Mobility Bed Mobility               General bed mobility comments: pt in chair at arrival, returned to chair    Transfers Overall transfer level: Needs assistance Equipment used: Rolling walker (2 wheeled) Transfers: Sit to/from Stand Sit to Stand: Min assist         General transfer comment: min A for steadying upon standing    Balance Overall balance assessment: Needs assistance Sitting-balance support: Feet supported Sitting balance-Leahy Scale: Fair     Standing balance support: Bilateral upper extremity supported Standing balance-Leahy Scale: Poor Standing balance  comment: pt with posterior lean upon standing and required assist from therapist to correct, reliant on external support for ambulation             ADL either performed or assessed with clinical judgement   ADL Overall ADL's : Needs assistance/impaired     Grooming: Min guard;Brushing hair;Wash/dry hands;Standing Grooming Details (indicate cue type and reason): groomed at the sink with min guard fro safety           Toilet Transfer: Min guard;Comfort height toilet;Regular Toilet;Grab bars Toilet Transfer Details (indicate cue type and reason): pt ambulated to toilet with RW given min guard for safety, sitting/standing from the toilet was more like supervision A wtih heavy use of grab bar         Functional mobility during ADLs: Minimal assistance;Rolling walker;Min guard (Min A for inital standing balance as pt has posterior lean and required physical assist to correct. Mni guard for ambulating within the room with RW) General ADL Comments: ADLs this session with min guard overall for safety due to posterior lean      Cognition Arousal/Alertness: Awake/alert Behavior During Therapy: WFL for tasks assessed/performed;Flat affect Overall Cognitive Status: Within Functional Limits for tasks assessed                  General Comments VSS on RA, no new concerns, pt understanding of need for rehab at d/c for safety    Pertinent Vitals/ Pain       Pain Assessment: No/denies pain  Frequency  Min 2X/week        Progress Toward Goals  OT Goals(current goals can now be found in the care plan section)  Progress towards OT goals: Progressing toward goals  Acute Rehab OT Goals Patient Stated Goal: to go home OT Goal Formulation: With patient Time For Goal Achievement: 09/17/20 Potential to Achieve Goals: Good ADL Goals Pt Will Perform Lower Body Bathing: with modified independence;sit to/from stand Pt Will Perform Tub/Shower Transfer: with modified  independence;Tub transfer;shower seat;grab bars Additional ADL Goal #1: Pt will complete standing task with no LOB or lateral leaning while use of DME as needed  Plan Discharge plan needs to be updated       AM-PAC OT "6 Clicks" Daily Activity     Outcome Measure   Help from another person eating meals?: None Help from another person taking care of personal grooming?: A Little Help from another person toileting, which includes using toliet, bedpan, or urinal?: A Little Help from another person bathing (including washing, rinsing, drying)?: A Little Help from another person to put on and taking off regular upper body clothing?: None Help from another person to put on and taking off regular lower body clothing?: A Little 6 Click Score: 20    End of Session Equipment Utilized During Treatment: Gait belt;Rolling walker  OT Visit Diagnosis: Unsteadiness on feet (R26.81);Other abnormalities of gait and mobility (R26.89);Repeated falls (R29.6);Muscle weakness (generalized) (M62.81);History of falling (Z91.81)   Activity Tolerance Patient tolerated treatment well   Patient Left in chair;with call bell/phone within reach;with chair alarm set   Nurse Communication Mobility status        Time: [id number] OT Time Calculation (min): 17 min  Charges: OT General Charges $OT Visit: 1 Visit OT Treatments $Self Care/Home Management : 8-22 mins     Caleb [name] A Shalamar Plourde 09/07/2020, 11:48 AM

## 2020-09-07 NOTE — Progress Notes (Signed)
MEDICATION RELATED CONSULT NOTE - INITIAL   Pharmacy Consult for replete phos  Patient Measurements: Height: '5\' 4"'$  (162.6 cm) Weight: 66.2 kg (145 lb 15.1 oz) IBW/kg (Calculated) : 59.2    Labs: Recent Labs    09/04/20 1712 09/04/20 2104 09/05/20 0318 09/05/20 0635 09/05/20 1735 09/06/20 0541 09/07/20 0210  WBC 9.3  --   --   --   --  9.1  --   HGB 13.8  --   --   --   --  13.1  --   HCT 36.8*  --   --   --   --  35.1*  --   PLT 155  --   --   --   --  163  --   CREATININE 0.62   < > 0.58*   < > 0.65 0.68 0.99  MG  --   --  1.9  --   --   --  1.9  PHOS  --    < > 1.2*   < > 2.8 2.6 2.0*  ALBUMIN  --    < > 3.2*   < > 3.2* 3.3* 3.3*   < > = values in this interval not displayed.    Medical History: Past Medical History:  Diagnosis Date   Acute respiratory failure with hypoxia (HCC)    Acute systolic congestive heart failure ([place]) 02/11/2014   COPD (chronic obstructive pulmonary disease) ([place]) 02/11/2014   Essential hypertension    Hypercholesteremia    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   LIMA to LAD, RIMA to RCA, SVG to OM1, EVH via right thigh   Tobacco abuse     Plan:  Will give K Phos 15 mmol IV x 1 F/u labs per MD  Jaiona Simien Pote[place] 09/07/2020,5:50 AM

## 2020-09-07 NOTE — NC FL2 (Signed)
[place] MEDICAID FL2 LEVEL OF CARE SCREENING TOOL     IDENTIFICATION  Patient Name: Caleb [name] Birthdate: 08-May-1950 Sex: male Admission Date (Current Location): 09/04/2020  [facility] and [place] Number:  Herbalist and Address:  [facility] [place], [place] [address] [place], [place] [address]      Provider Number: [id number]  Attending Physician Name and Address:  Modena Jansky, MD  Relative Name and Phone Number:  Randalyn Rhea [phone]    Current Level of Care: Hospital Recommended Level of Care: [place] Prior Approval Number:    Date Approved/Denied:   PASRR Number: [id number]  Discharge Plan: SNF    Current Diagnoses: Patient Active Problem List   Diagnosis Date Noted   Hyponatremia 09/05/2020   Spinal stenosis of cervical region 04/21/2020   Monoclonal gammopathy 04/13/2020   SIRS (systemic inflammatory response syndrome) ([place]) 08/07/2019   Demand ischemia ([place])    Gastritis with hemorrhage 02/03/2019   PVD (peripheral vascular disease) ([place]) 02/03/2019   Chronic anticoagulation 02/03/2019   Acute kidney injury ([place]) 08/14/2018   Iron deficiency anemia 08/14/2018   Acute hyponatremia 08/13/2018   Renal artery stenosis ([place]) 10/22/2017   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   CHF (congestive heart failure) (HCC)    Tobacco abuse    Essential hypertension    Hypercholesteremia    Lactic acidosis 02/12/2014   Sinus tachycardia 02/12/2014   Acute respiratory failure with hypoxia (HCC)    Elevated troponin    COPD (chronic obstructive pulmonary disease) ([place]) 02/11/2014   COPD with acute exacerbation ([place]) 02/11/2014   Abnormal EKG [id number]   Acute systolic (congestive) heart failure ([place]) 02/11/2014    Orientation RESPIRATION BLADDER Height & Weight     Self, Time, Situation, Place (WDL)  Normal Incontinent, External catheter (External Urinary Catheter) Weight: 145 lb 15.1 oz (66.2 kg) Height:  '5\' 4"'$  (162.6 cm)   BEHAVIORAL SYMPTOMS/MOOD NEUROLOGICAL BOWEL NUTRITION STATUS      Continent Diet (Please see discharge summary)  AMBULATORY STATUS COMMUNICATION OF NEEDS Skin   Limited Assist Verbally Other (Comment) (abrasion R,anterior,mid,ecchymosis,abdomen,flank,L,wound/incision open or dehiced diabetic ulcer,heel,L,wound incision open or dehiced laceration toe,R,laceration to 2nd toe at distal joint,bleeding controlled with combat gauze)                       Personal Care Assistance Level of Assistance  Bathing, Feeding, Dressing Bathing Assistance: Limited assistance Feeding assistance: Independent (able to feed self) Dressing Assistance: Limited assistance     Functional Limitations Info  Sight, Hearing, Speech     Speech Info: Adequate    SPECIAL CARE FACTORS FREQUENCY  PT (By licensed PT), OT (By licensed OT)     PT Frequency: 5x min weekly OT Frequency: 5x min weekly            Contractures Contractures Info: Not present    Additional Factors Info  Code Status, Allergies Code Status Info: FULL Allergies Info: Sulfamethoxazole-trimethoprim,Cilostazol,Lisinopril,Losartan           Current Medications (09/07/2020):  This is the current hospital active medication list Current Facility-Administered Medications  Medication Dose Route Frequency Provider Last Rate Last Admin   acetaminophen (TYLENOL) tablet 650 mg  650 mg Oral Q6H PRN Rise Patience, MD   650 mg at 09/05/20 0430   Or   acetaminophen (TYLENOL) suppository 650 mg  650 mg Rectal Q6H PRN Rise Patience, MD       albuterol (PROVENTIL) (2.5  MG/3ML) 0.083% nebulizer solution 2.5 mg  2.5 mg Inhalation Q6H PRN Rise Patience, MD       amLODipine (NORVASC) tablet 5 mg  5 mg Oral Daily Rise Patience, MD   5 mg at 09/07/20 [id number]   aspirin EC tablet 81 mg  81 mg Oral Daily Rise Patience, MD   81 mg at 09/07/20 [id number]   atorvastatin (LIPITOR) tablet 80 mg  80 mg Oral Daily Rise Patience, MD   80 mg at 09/07/20 [id number]   carvedilol (COREG) tablet 25 mg  25 mg Oral BID WC Rise Patience, MD   25 mg at 09/07/20 [id number]   cephALEXin (KEFLEX) capsule 500 mg  500 mg Oral Q8H Hongalgi, Anand D, MD   500 mg at 09/07/20 1439   cholecalciferol (VITAMIN D3) tablet 1,000 Units  1,000 Units Oral Q breakfast Rise Patience, MD   1,000 Units at 09/07/20 [id number]   ferrous sulfate tablet 325 mg  325 mg Oral Q breakfast Rise Patience, MD   325 mg at [id number]   folic acid (FOLVITE) tablet 1 mg  1 mg Oral Daily Rise Patience, MD   1 mg at 09/07/20 [id number]   gabapentin (NEURONTIN) capsule 300 mg  300 mg Oral TID Rise Patience, MD   300 mg at 09/07/20 1439   hydrALAZINE (APRESOLINE) injection 10 mg  10 mg Intravenous Q4H PRN Rise Patience, MD       irbesartan (AVAPRO) tablet 300 mg  300 mg Oral Daily Rise Patience, MD   300 mg at 09/07/20 [id number]   nitroGLYCERIN (NITROSTAT) SL tablet 0.4 mg  0.4 mg Sublingual Q5 min PRN Rise Patience, MD       pantoprazole (PROTONIX) EC tablet 40 mg  40 mg Oral Daily Rise Patience, MD   40 mg at 09/07/20 [id number]   rivaroxaban (XARELTO) tablet 2.5 mg  2.5 mg Oral BID Rise Patience, MD   2.5 mg at 09/07/20 [id number]   thiamine tablet 100 mg  100 mg Oral Daily Rise Patience, MD   100 mg at 09/07/20 [id number]   vitamin B-12 (CYANOCOBALAMIN) tablet 1,000 mcg  1,000 mcg Oral Q breakfast Rise Patience, MD   1,000 mcg at 09/07/20 [id number]     Discharge Medications: Please see discharge summary for a list of discharge medications.  Relevant Imaging Results:  Relevant Lab Results:   Additional Information [id number]  Trula Ore, LCSWA

## 2020-09-08 DIAGNOSIS — R296 Repeated falls: Secondary | ICD-10-CM | POA: Diagnosis not present

## 2020-09-08 DIAGNOSIS — E871 Hypo-osmolality and hyponatremia: Secondary | ICD-10-CM | POA: Diagnosis not present

## 2020-09-08 DIAGNOSIS — M79674 Pain in right toe(s): Secondary | ICD-10-CM

## 2020-09-08 LAB — RENAL FUNCTION PANEL
Albumin: 3.3 g/dL — ABNORMAL LOW (ref 3.5–5.0)
Anion gap: 7 (ref 5–15)
BUN: 13 mg/dL (ref 8–23)
CO2: 25 mmol/L (ref 22–32)
Calcium: 9.5 mg/dL (ref 8.9–10.3)
Chloride: 95 mmol/L — ABNORMAL LOW (ref 98–111)
Creatinine, Ser: 0.8 mg/dL (ref 0.61–1.24)
GFR, Estimated: >60 mL/min (ref >60)
Glucose, Bld: 110 mg/dL — ABNORMAL HIGH (ref 70–99)
Phosphorus: 3.1 mg/dL (ref 2.5–4.6)
Potassium: 4 mmol/L (ref 3.5–5.1)
Sodium: 127 mmol/L — ABNORMAL LOW (ref 135–145)

## 2020-09-08 LAB — SARS CORONAVIRUS 2 (TAT 6-24 HRS): SARS Coronavirus 2: NEGATIVE

## 2020-09-08 MED ORDER — ADULT MULTIVITAMIN W/MINERALS CH
1.0000 | ORAL_TABLET | Freq: Every day | ORAL | Status: DC
  Administered 2020-09-08 – 2020-09-09 (×2): 1 via ORAL
  Filled 2020-09-08 (×2): qty 1

## 2020-09-08 MED ORDER — POLYETHYLENE GLYCOL 3350 17 G PO PACK
17.0000 g | PACK | Freq: Every day | ORAL | Status: DC
  Administered 2020-09-08 – 2020-09-09 (×2): 17 g via ORAL
  Filled 2020-09-08 (×2): qty 1

## 2020-09-08 MED ORDER — AMLODIPINE BESYLATE 2.5 MG PO TABS
2.5000 mg | ORAL_TABLET | Freq: Every day | ORAL | Status: DC
  Administered 2020-09-09: 2.5 mg via ORAL
  Filled 2020-09-08: qty 1

## 2020-09-08 MED ORDER — SENNOSIDES-DOCUSATE SODIUM 8.6-50 MG PO TABS
1.0000 | ORAL_TABLET | Freq: Two times a day (BID) | ORAL | Status: DC
  Administered 2020-09-08 – 2020-09-09 (×3): 1 via ORAL
  Filled 2020-09-08 (×3): qty 1

## 2020-09-08 MED ORDER — ENSURE ENLIVE PO LIQD
237.0000 mL | Freq: Two times a day (BID) | ORAL | Status: DC
  Administered 2020-09-08 – 2020-09-09 (×3): 237 mL via ORAL

## 2020-09-08 MED ORDER — BISACODYL 5 MG PO TBEC: 10.0000 mg | DELAYED_RELEASE_TABLET | Freq: Every day | ORAL | Status: DC | PRN

## 2020-09-08 NOTE — Progress Notes (Signed)
   PODIATRY PROGRESS NOTE  NAME Caleb [name] MRN [id number] DOB 1950/08/30 DOA 09/04/2020   Reason for consult: laceration left 2nd toe Chief Complaint  Patient presents with   Toe Pain   Subjective: Follow-up for laceration right second toe.  Patient scheduled to be discharged to SNF.  Pending placement.  Patient seen at bedside this evening.  He is doing well.  He says that the second toe does not hurt.  Patient resting comfortably.  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   LIMA to LAD, RIMA to RCA, SVG to OM1, EVH via right thigh   Tobacco abuse     CBC Latest Ref Rng & Units 09/06/2020 09/04/2020 08/01/2020  WBC 4.0 - 10.5 K/uL 9.1 9.3 5.0  Hemoglobin 13.0 - 17.0 g/dL 13.1 13.8 14.1  Hematocrit 39.0 - 52.0 % 35.1(L) 36.8(L) 40.1  Platelets 150 - 400 K/uL 163 155 177    BMP Latest Ref Rng & Units 09/08/2020 09/07/2020 09/07/2020  Glucose 70 - 99 mg/dL 110(H) - 115(H)  BUN 8 - 23 mg/dL 13 - 12  Creatinine 0.61 - 1.24 mg/dL 0.80 - 0.99  BUN/Creat Ratio 10 - 24 - - -  Sodium 135 - 145 mmol/L 127(L) 125(L) 124(L)  Potassium 3.5 - 5.1 mmol/L 4.0 - 4.2  Chloride 98 - 111 mmol/L 95(L) - 92(L)  CO2 22 - 32 mmol/L 25 - 23  Calcium 8.9 - 10.3 mg/dL 9.5 - 9.1    ASSESSMENT/PLAN OF CARE Right second toe laceration: Laceration continues to be stable.  Patient now 5 days post DOI. The ecchymosis around the toe appears to be improved over the last few days since last visit.  Patient is/P tetanus booster in ED.  Currently on oral Keflex 7-day course.  Continue light dressings as per standing wound care order.  Follow-up within 1 week in office post discharge.  Please contact me directly with any questions or concerns.  Cell [phone]   Edrick Kins, DPM [facility]  Dr. Edrick Kins, DPM    [address], [place] [address]                Office [phone]  Fax [phone]

## 2020-09-08 NOTE — TOC Progression Note (Addendum)
Transition of Care [facility]) - Progression Note    Patient Details  Name: JAEGAR SAMMARCO MRN: [id number] Date of Birth: 1950/03/21  Transition of Care [facility]) CM/SW [place], [place] Phone Number: 09/08/2020, 11:16 AM  Clinical Narrative:     Update- CSW spoke with patients daughter and provided SNF bed offers. Patients daughter chose SNF placement at [facility]. CSW confirmed SNF bed with Juliann Pulse.CSW started insurance authorization for patient reference number is [id number]. Patient updated.Covid requested.CSW will continue to follow and assist with dc planning needs.  CSW left voicemail for patients daughter Levada Dy. CSW awaiting callback to discuss SNF bed offers. CSW will continue to follow and assist with dc planning needs.  Expected Discharge Plan: [place] Barriers to Discharge: Continued Medical Work up  Expected Discharge Plan and Services Expected Discharge Plan: [place] In-house Referral: Clinical Social Work Discharge Planning Services: CM Consult Post Acute Care Choice: [place] arrangements for the past 2 months: Single Family Home                 DME Arranged: N/A         HH Arranged: PT HH Agency: [place] Date [place]: 09/05/20 Time [place]: [place] Representative spoke with at [place]: [place] ([place]) Interventions    Readmission Risk Interventions No flowsheet data found.

## 2020-09-08 NOTE — Progress Notes (Signed)
Initial Nutrition Assessment  DOCUMENTATION CODES:   Non-severe (moderate) malnutrition in context of chronic illness  INTERVENTION:   Ensure Enlive po BID, each supplement provides 350 kcal and 20 grams of protein. MVI with minerals daily.  NUTRITION DIAGNOSIS:   Moderate Malnutrition related to social / environmental circumstances, chronic illness (alcohol use disorder, COPD, CHF) as evidenced by mild muscle depletion, moderate muscle depletion, mild fat depletion, moderate fat depletion.  GOAL:   Patient will meet greater than or equal to 90% of their needs  MONITOR:   PO intake, Supplement acceptance, Labs, Skin  REASON FOR ASSESSMENT:   Consult Assessment of nutrition requirement/status  ASSESSMENT:   70 yo male admitted with hyponatremia and toe pain. PMH includes CAD, CABG, CHF, PAD, ongoing tobacco use, alcohol use disorder, COPD, chronic hyponatremia.  Hyponatremia suspected to be related to poor oral intake, beer intake, and recent increase in Lasix. Sodium up to 127 today.  Currently on a heart healthy diet with 1200 ml fluid restriction.  Meal intakes: 75-100%  Per patient, usual intake at home consists of 2 snacks (breakfast: vienna sausage and crackers, lunch: peanut butter and crackers), and dinner: TV dinner or burger and fries. He drinks a 6 pack of beer daily after 5 pm. He drinks one chocolate Ensure supplement daily. He is followed at the wound center for a foot ulceration. His usual weight is 148 lbs. He thinks he has lost a few pounds over the past week, but unsure exact amount.   Labs reviewed. Na 127 CBG: not checking  Medications reviewed and include cholecalciferol, ferrous sulfate, folic acid, protonix, miralax, senokot-s, thiamine, vitamin B12 tablet daily.  Weight history reviewed. No significant weight changes noted. Weight is down by ~7 lbs since admission r/t diuresis.   NUTRITION - FOCUSED PHYSICAL EXAM:  Flowsheet Row Most Recent Value   Orbital Region Mild depletion  Upper Arm Region Moderate depletion  Thoracic and Lumbar Region Mild depletion  Buccal Region Mild depletion  Temple Region Moderate depletion  Clavicle Bone Region Mild depletion  Clavicle and Acromion Bone Region Mild depletion  Scapular Bone Region Mild depletion  Dorsal Hand Moderate depletion  Patellar Region Moderate depletion  Anterior Thigh Region Moderate depletion  Posterior Calf Region Moderate depletion  Edema (RD Assessment) None  Hair Reviewed  Eyes Reviewed  Mouth Reviewed  Skin Reviewed  Nails Reviewed       Diet Order:   Diet Order             Diet Heart Room service appropriate? Yes; Fluid consistency: Thin; Fluid restriction: 1200 mL Fluid  Diet effective now                   EDUCATION NEEDS:   Not appropriate for education at this time  Skin:  Skin Assessment: Skin Integrity Issues: Skin Integrity Issues:: Other (Comment) Other: laceration to second toe on R foot, L heel wound  Last BM:  8/22  Height:   Ht Readings from Last 1 Encounters:  09/05/20 '5\' 4"'$  (1.626 m)    Weight:   Wt Readings from Last 1 Encounters:  09/08/20 64.2 kg    Ideal Body Weight:  59.1 kg  BMI:  Body mass index is 24.31 kg/m.  Estimated Nutritional Needs:   Kcal:  1900-2100  Protein:  90-110 gm  Fluid:  1.2 L fluid restriction    Lucas Mallow, RD, LDN, CNSC Please refer to Amion for contact information.

## 2020-09-08 NOTE — Progress Notes (Signed)
Physical Therapy Treatment Patient Details Name: Caleb [name] MRN: [id number] DOB: [birth year] Today's Date: 09/08/2020    History of Present Illness Pt is a 70yo male presenting to [facility] ED on 8/21 with complaints of fall and associated right second toe injury. Also found to have acute hyponatremia. PMH: s/p right great toe removal, COPD, CHF, HTN, s/p CABGx3.    PT Comments    Pt supine in bed on entry, requesting to urinate. PT suggested to attempt using urinal standing beside bed. Pt agreeable, able to get to sitting EoB with mod I and min guard for power up to RW. Able to use urinal with increased posterior support on LE from bed. Pt continues to be limited in safe mobility by decreased balance and endurance. Pt ambulates with min guard however has LoB requiring modA for steadying. With returned to room worked on standing balance. D/c plans remain appropriate at this time. PT will continue to follow acutely.   Follow Up Recommendations  SNF     Equipment Recommendations  None recommended by PT       Precautions / Restrictions Precautions Precautions: Fall Precaution Comments: Recent fall while carrying clothes from [place]. Pt reports other falls in the past two months that were "less bad."    Mobility  Bed Mobility Overal bed mobility: Modified Independent             General bed mobility comments: increased effort to get to EoB    Transfers Overall transfer level: Needs assistance Equipment used: Rolling walker (2 wheeled) Transfers: Sit to/from Stand Sit to Stand: Min guard         General transfer comment: min guard for safety, although pt with posterior lean and reliance on posterior LE against bed to steady,  Ambulation/Gait Ambulation/Gait assistance: Min guard;Mod assist Gait Distance (Feet): 125 Feet Assistive device: Rolling walker (2 wheeled) Gait Pattern/deviations: Decreased step length - right;Step-through pattern;Decreased step length - left Gait  velocity: decreased Gait velocity interpretation: <1.31 ft/sec, indicative of household ambulator General Gait Details: Continues to be limited in safe mobility by LoB requiring modA for regaining balance, vc for proximity to RW so he can assist in balance recovery more effectively         Balance Overall balance assessment: Needs assistance Sitting-balance support: Feet supported Sitting balance-Leahy Scale: Fair     Standing balance support: Bilateral upper extremity supported Standing balance-Leahy Scale: Poor Standing balance comment: pt with increased posterior lean in standing, requires at least single UE support for balance.                            Cognition Arousal/Alertness: Awake/alert Behavior During Therapy: WFL for tasks assessed/performed;Flat affect Overall Cognitive Status: Within Functional Limits for tasks assessed                                        Exercises Other Exercises Other Exercises: 5 min balance training with no UE support to include static balance, able to maintain without assist and reaching to limit of BoS and across body, limited by fatigue    General Comments General comments (skin integrity, edema, etc.): VSS on RA      Pertinent Vitals/Pain Pain Location: R foot Pain Descriptors / Indicators: Aching;Sore     PT Goals (current goals can now be found in the care plan section) Acute  Rehab PT Goals Patient Stated Goal: to go home PT Goal Formulation: With patient Time For Goal Achievement: 09/19/20 Potential to Achieve Goals: Good Progress towards PT goals: Progressing toward goals    Frequency    Min 2X/week      PT Plan Frequency needs to be updated;Discharge plan needs to be updated       AM-PAC PT "6 Clicks" Mobility   Outcome Measure  Help needed turning from your back to your side while in a flat bed without using bedrails?: A Little Help needed moving from lying on your back to  sitting on the side of a flat bed without using bedrails?: A Little Help needed moving to and from a bed to a chair (including a wheelchair)?: A Little Help needed standing up from a chair using your arms (e.g., wheelchair or bedside chair)?: A Little Help needed to walk in hospital room?: A Lot Help needed climbing 3-5 steps with a railing? : A Lot 6 Click Score: 16    End of Session Equipment Utilized During Treatment: Gait belt Activity Tolerance: Patient tolerated treatment well Patient left: in chair;with call bell/phone within reach;with chair alarm set;with nursing/sitter in room Nurse Communication: Mobility status PT Visit Diagnosis: Unsteadiness on feet (R26.81);History of falling (Z91.81);Muscle weakness (generalized) (M62.81);Difficulty in walking, not elsewhere classified (R26.2)     Time: [id number] PT Time Calculation (min) (ACUTE ONLY): 15 min  Charges:  $Therapeutic Exercise: 8-22 mins                     Elsy Chiang B. Migdalia Dk PT, DPT Acute Rehabilitation Services Pager [phone] Office [phone]    [place] 09/08/2020, 10:58 AM

## 2020-09-08 NOTE — Plan of Care (Signed)
  Problem: Nutrition: Goal: Adequate nutrition will be maintained Outcome: Progressing   Problem: Coping: Goal: Level of anxiety will decrease Outcome: Progressing   

## 2020-09-08 NOTE — Progress Notes (Signed)
Admit: 09/04/2020 LOS: 3  88M AoC hypotnic euvolemic hyponatremia, ASx, probably is low solute related  Subjective:  SNa up to 127 Orthostatic this AM, on '5mg'$  amlodpine; supin SBPs elevated  08/24 0701 - 08/25 0700 In: 258.2 [IV Piggyback:258.2] Out: 800 [Urine:800]  Filed Weights   09/06/20 0400 09/07/20 0424 09/08/20 0302  Weight: 68 kg 66.2 kg 64.2 kg    Scheduled Meds:  amLODipine  5 mg Oral Daily   aspirin EC  81 mg Oral Daily   atorvastatin  80 mg Oral Daily   carvedilol  25 mg Oral BID WC   cephALEXin  500 mg Oral Q8H   cholecalciferol  1,000 Units Oral Q breakfast   ferrous sulfate  325 mg Oral Q breakfast   folic acid  1 mg Oral Daily   gabapentin  300 mg Oral TID   irbesartan  300 mg Oral Daily   pantoprazole  40 mg Oral Daily   polyethylene glycol  17 g Oral Daily   rivaroxaban  2.5 mg Oral BID   senna-docusate  1 tablet Oral BID   thiamine  100 mg Oral Daily   vitamin B-12  1,000 mcg Oral Q breakfast   Continuous Infusions:    PRN Meds:.acetaminophen **OR** acetaminophen, albuterol, bisacodyl, hydrALAZINE, nitroGLYCERIN  Current Labs: reviewed  Results for TEDRIC, COLLINGWOOD (MRN [id number]) as of 09/05/2020 14:46  Ref. Range 09/04/2020 17:13  Osmolality, Urine Latest Ref Range: 300 - 900 mOsm/kg 241 (L)  Results for ALEXANDRE, REXROAD (MRN [id number]) as of 09/05/2020 14:46  Ref. Range 09/05/2020 04:40  Sodium, Urine Latest Units: mmol/L 91  Results for CYPRUS, OSKEY (MRN [id number]) as of 09/05/2020 14:46  Ref. Range 09/04/2020 17:12  Osmolality Latest Ref Range: 275 - 295 mOsm/kg 245 (LL)    Physical Exam:  Blood pressure (!) 171/76, pulse 76, temperature 98.9 F (37.2 C), temperature source Oral, resp. rate 18, height '5\' 4"'$  (1.626 m), weight 64.2 kg, SpO2 96 %. NAD, chronically ill appearing RRR CTAB No [place] Nonfocal, AAO x3 S/nt/nd  A AoC hypotonic euvolemic hyponatremia: I think this is primarily low solute driven leading  impaired free water excretion. (Low  P and BUN support this) UNa is higher than would be expected.  It did improve with IVFs which is not consistent with SIADH.  Cont to push solute, limit fliuds (1.2L total daily input); stopped cymbalta as well given little improvement in his pain syndrome and its possibility to contribute, but his hyponatremia preexisted use of Cymbalta.  Now > 127, clearly improvd and pt is ASx, ok to trend as outpatient at this point   R 2nd toe laceration, podatry following Hypophosphatemia, likely poor PO, repleted;  Heavy ETOH use Hypokalemia, improved with repletion Hx/o CHF, not decompensated HTN, but also orthostatic  P Reduce amlodipine to 2.'5mg'$ /d OK for discharge Offered f/u at [facility] with me or Dr. Johnney Ou; he would like for SNF to follow labs and refer to [place] as needed Will sign off for now.  Please call with any questions or concerns.    [name] 09/08/2020, 12:10 PM  Recent Labs  Lab 09/06/20 0541 09/06/20 1603 09/07/20 0210 09/07/20 1532 09/08/20 0241  NA 122*   < > 124* 125* 127*  K 3.4*  --  4.2  --  4.0  CL 90*  --  92*  --  95*  CO2 23  --  23  --  25  GLUCOSE 120*  --  115*  --  110*  BUN 5*  --  12  --  13  CREATININE 0.68  --  0.99  --  0.80  CALCIUM 8.9  --  9.1  --  9.5  PHOS 2.6  --  2.0*  --  3.1   < > = values in this interval not displayed.    Recent Labs  Lab 09/04/20 1712 09/06/20 0541  WBC 9.3 9.1  NEUTROABS 7.9*  --   HGB 13.8 13.1  HCT 36.8* 35.1*  MCV 90.0 91.2  PLT 155 163

## 2020-09-08 NOTE — Progress Notes (Signed)
PROGRESS NOTE   Caleb [name]  [id number]    DOB: 1950/01/27    DOA: 09/04/2020  PCP: Percell Belt, DO   I have briefly reviewed patients previous medical records in [facility].  Chief Complaint  Patient presents with   Toe Pain    Brief Narrative:  70 year old male with PMH of CAD s/p CABG, chronic systolic CHF with last EF 45-50% and grade 2 diastolic dysfunction in July 2021, PAD s/p femoropopliteal bypass, ongoing tobacco abuse, alcohol use disorder, COPD, chronic hyponatremia, was referred to the ED after patient's recent serum sodium done as outpatient 2 days prior to admission was 114.  He reported feeling weak at times and confused as per patient's daughter.  He also recently hit his leg while taking laundry out of the washing machine and had falls without head injury or LOC.  Recently his Lasix dose was increased due to leg swelling.  In ED, lab work showed serum sodium of 116, potassium 3, serum osmolarity 245, urine osmolarity 241, CT head unremarkable, x-ray of right foot negative for fracture.  Nephrology consulted, briefly on IV saline hydration, serum sodium has gradually improved up to 127.   Assessment & Plan:  Principal Problem:   Acute hyponatremia Active Problems:   COPD (chronic obstructive pulmonary disease) (HCC)   Essential hypertension   S/P CABG x 3   Chronic anticoagulation   Hyponatremia   Subacute on chronic hyponatremia: Likely multifactorial related to poor oral intake, beer intake, recent increase in Lasix.  Nephrology input appreciated.  Briefly on IV saline hydration, BMP was closely monitored.  Picture not consistent with SIADH. Encourage oral intake.  Per nephrology, salt tablets do not help.  Cymbalta which had been recently started outpatient was [facility] 8/23.  AMS resolved.  Serum sodium has gradually improved up to 127.  Stable for DC from the standpoint.  Per patient's preference, outpatient follow-up with Nephrology as needed.  Acute  metabolic encephalopathy: Resolved.  May be multifactorial.  Unsure if hyponatremia by itself was contributing to it all.    Frequent falls: Multifactorial due to deconditioning, left heel wound, was wheelchair-bound for a while until recently due to the heel wound then only started ambulating recently, alcohol and orthostatic hypotension etc.  Daughter states that she is unable to provide the 24/7 supervision or assistance even though she lives next door.  Patient agreeable for SNF.  TOC consulted.  Insurance authorization pending.  Alcohol dependence: Reports drinking up to 6 beers daily.  No overt withdrawal.  Abstinence counseled.    Hypokalemia: Replaced.  Magnesium 1.9.  Hypophosphatemia: Due to poor oral intake.  Recurrent.  Replaced.  Chronic systolic CHF: Compensated.  As discussed with nephrology, discontinue Lasix for now.  Reassess during outpatient follow-up  Essential hypertension/orthostatic hypotension:  Continue amlodipine, carvedilol and irbesartan.  As needed IV hydralazine.  Significant orthostatic changes 8/24 (supine BP 152/69, sitting 129/58, standing 101/78)-possibly contributing to falls.  No syncope.  Euvolemic.  Added TED hoses-not placed up to this morning, discussed with RN.  8/25: Remains severely orthostatic, (supine 173/77, standing 113/47), fortunately not symptomatic.  Reducing amlodipine dose from 5-2.5 Mg.  Monitor.    CAD s/p CABG: No anginal symptoms.  Continue beta-blockers.  PAD s/p bypass: Chronic nonhealing left heel wound but not infected.  Unclear if he is on Xarelto for this indication.  Right second toe laceration: Podiatry input appreciated.  They do not see benefit now off primary closure with sutures since this occurred 2  days prior to admission.  They indicate no infection.  Patient however had fever up to 102 on 8/21.  Outpatient follow-up with Dr. Daylene Katayama.  S/p tetanus booster in ED. Was on IV Ancef, now switched to Keflex to complete total 7  days course.  Fever: Unclear etiology.  Urine microscopy not consistent with UTI.  Chest x-ray without acute findings.  Antibiotics as above.  Blood cultures: NTD.  Urine culture negative.  COPD: Stable  Tobacco abuse: Cessation counseled.  Body mass index is 24.31 kg/m./Suspect severe protein calorie malnutrition: Due to poor oral intake.  Dietitian consulted.   DVT prophylaxis: Place TED hose Start: 09/08/20 0757 Place TED hose Start: 09/07/20 1257  Xarelto   Code Status: Full Code Family Communication: I discussed in detail with patient's daughter via phone 8/23.  None at bedside today Disposition:  Status is: Inpatient  The patient will require care spanning > 2 midnights and should be moved to inpatient because: Inpatient level of care appropriate due to severity of illness  Dispo: The patient is from: Home              Anticipated d/c is to: SNF              Patient currently is not medically stable to d/c.     Difficult to place patient No        Consultants:   Nephrology Podiatry.  Procedures:   None.  Antimicrobials:    Anti-infectives (From admission, onward)    Start     Dose/Rate Route Frequency Ordered Stop   09/06/20 2200  cephALEXin (KEFLEX) capsule 500 mg        500 mg Oral Every 8 hours 09/06/20 1545     09/05/20 0700  ceFAZolin (ANCEF) IVPB 2g/100 mL premix  Status:  Discontinued        2 g 200 mL/hr over 30 Minutes Intravenous Every 8 hours 09/05/20 0632 09/06/20 1545   09/04/20 1900  ceFAZolin (ANCEF) IVPB 1 g/50 mL premix        1 g 100 mL/hr over 30 Minutes Intravenous  Once 09/04/20 1848 09/04/20 2003         Subjective:  No complaints reported.  Denies dizziness or lightheadedness.  Objective:   Vitals:   09/08/20 0759 09/08/20 0900 09/08/20 1210 09/08/20 1212  BP: (!) 166/77 (!) 171/76 (!) 92/47 (!) 98/53  Pulse: 78 76 71 70  Resp: '18 18 18   '$ Temp: 98.5 F (36.9 C) 98.9 F (37.2 C) 98.2 F (36.8 C)   TempSrc: Oral Oral  Oral   SpO2: 99% 96% 100%   Weight:      Height:        General exam: Elderly male, moderately built, frail and chronically ill lying comfortably supine in bed.  Oral mucosa moist. Respiratory system: Clear to auscultation.  No increased work of breathing. Cardiovascular system: S1 and S2 heard, RRR.  No JVD, murmurs or pedal edema. Gastrointestinal system: Abdomen is nondistended, soft and nontender. No organomegaly or masses felt. Normal bowel sounds heard. Central nervous system: Alert and oriented.  No focal neurological deficits. Extremities: Symmetric 5 x 5 power.  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  Blister/chronic.  Laceration over medial aspect of right second toe with gauze stuck to dry blood.  The right toe looks bruised but improved compared to before.  No clinical features consistent with cellulitis.  Left heel with tiny superficial ulcer with no acute findings.  Skin: No rashes, lesions or ulcers Psychiatry: Judgement and insight appear normal. Mood & affect flat.     Data Reviewed:   I have personally reviewed following labs and imaging studies   CBC: Recent Labs  Lab 09/04/20 1712 09/06/20 0541  WBC 9.3 9.1  NEUTROABS 7.9*  --   HGB 13.8 13.1  HCT 36.8* 35.1*  MCV 90.0 91.2  PLT 155 [id number]    Basic Metabolic Panel: Recent Labs  Lab 09/05/20 0318 09/05/20 0635 09/06/20 0541 09/06/20 1603 09/07/20 0210 09/07/20 1532 09/08/20 0241  NA 120*   < > 122*   < > 124* 125* 127*  K 2.7*   < > 3.4*  --  4.2  --  4.0  CL 88*   < > 90*  --  92*  --  95*  CO2 23   < > 23  --  23  --  25  GLUCOSE 156*   < > 120*  --  115*  --  110*  BUN 5*   < > 5*  --  12  --  13  CREATININE 0.58*   < > 0.68  --  0.99  --  0.80  CALCIUM 8.5*   < > 8.9  --  9.1  --  9.5  MG 1.9  --   --   --  1.9  --   --   PHOS 1.2*   < > 2.6  --  2.0*  --  3.1   < > = values in this interval not displayed.    Liver Function Tests: Recent Labs  Lab 09/06/20 0541  09/07/20 0210 09/08/20 0241  ALBUMIN 3.3* 3.3* 3.3*    CBG: No results for input(s): GLUCAP in the last 168 hours.  Microbiology Studies:   Recent Results (from the past 240 hour(s))  SARS CORONAVIRUS 2 (TAT 6-24 HRS) Nasopharyngeal Nasopharyngeal Swab     Status: None   Collection Time: 09/04/20  9:22 PM   Specimen: Nasopharyngeal Swab  Result Value Ref Range Status   SARS Coronavirus 2 NEGATIVE NEGATIVE Final    Comment: (NOTE) SARS-CoV-2 target nucleic acids are NOT DETECTED.  The SARS-CoV-2 RNA is generally detectable in upper and lower respiratory specimens during the acute phase of infection. Negative results do not preclude SARS-CoV-2 infection, do not rule out co-infections with other pathogens, and should not be used as the sole basis for treatment or other patient management decisions. Negative results must be combined with clinical observations, patient history, and epidemiological information. The expected result is Negative.  Fact Sheet for Patients: SugarRoll.be  Fact Sheet for Healthcare Providers: https://www.woods-mathews.com/  This test is not yet approved or cleared by the [place] FDA and  has been authorized for detection and/or diagnosis of SARS-CoV-2 by FDA under an Emergency Use Authorization (EUA). This EUA will remain  in effect (meaning this test can be used) for the duration of the COVID-19 declaration under Se ction 564(b)(1) of the Act, 21 U.S.C. section 360bbb-3(b)(1), unless the authorization is terminated or revoked sooner.  Performed at [facility], [place] [address], [place], [place] [address]   Urine Culture     Status: None   Collection Time: 09/05/20  4:36 AM   Specimen: Urine, Clean Catch  Result Value Ref Range Status   Specimen Description URINE, CLEAN CATCH  Final   Special Requests NONE  Final   Culture   Final    NO GROWTH Performed at [facility],  [address] [place], [place] [address]    Report Status 09/06/2020 FINAL  Final  Culture, blood (routine x 2)     Status: None (Preliminary result)   Collection Time: 09/05/20  6:14 AM   Specimen: BLOOD RIGHT HAND  Result Value Ref Range Status   Specimen Description BLOOD RIGHT HAND  Final   Special Requests   Final    BOTTLES DRAWN AEROBIC AND ANAEROBIC Blood Culture results may not be optimal due to an excessive volume of blood received in culture bottles   Culture   Final    NO GROWTH 3 DAYS Performed at [facility], [place] [address] [place], [place] [address]    Report Status PENDING  Incomplete  Culture, blood (routine x 2)     Status: None (Preliminary result)   Collection Time: 09/05/20  6:15 AM   Specimen: BLOOD LEFT HAND  Result Value Ref Range Status   Specimen Description BLOOD LEFT HAND  Final   Special Requests   Final    BOTTLES DRAWN AEROBIC AND ANAEROBIC Blood Culture results may not be optimal due to an excessive volume of blood received in culture bottles   Culture   Final    NO GROWTH 3 DAYS Performed at [facility], [place] [address] [place], [place] [address]    Report Status PENDING  Incomplete     Radiology Studies:  No results found.   Scheduled Meds:    [START ON 09/09/2020] amLODipine  2.5 mg Oral Daily   aspirin EC  81 mg Oral Daily   atorvastatin  80 mg Oral Daily   carvedilol  25 mg Oral BID WC   cephALEXin  500 mg Oral Q8H   cholecalciferol  1,000 Units Oral Q breakfast   ferrous sulfate  325 mg Oral Q breakfast   folic acid  1 mg Oral Daily   gabapentin  300 mg Oral TID   irbesartan  300 mg Oral Daily   pantoprazole  40 mg Oral Daily   polyethylene glycol  17 g Oral Daily   rivaroxaban  2.5 mg Oral BID   senna-docusate  1 tablet Oral BID   thiamine  100 mg Oral Daily   vitamin B-12  1,000 mcg Oral Q breakfast    Continuous Infusions:       LOS: 3 days     Vernell Leep, MD, [place], [facility]. [facility]    To contact  the attending provider between 7A-7P or the covering provider during after hours 7P-7A, please log into the web site [email or web address] and access using universal [place] password for that web site. If you do not have the password, please call the hospital operator.  09/08/2020, 12:22 PM

## 2020-09-09 DIAGNOSIS — E871 Hypo-osmolality and hyponatremia: Secondary | ICD-10-CM | POA: Diagnosis not present

## 2020-09-09 DIAGNOSIS — S91119S Laceration without foreign body of unspecified toe without damage to nail, sequela: Secondary | ICD-10-CM

## 2020-09-09 DIAGNOSIS — E440 Moderate protein-calorie malnutrition: Secondary | ICD-10-CM | POA: Insufficient documentation

## 2020-09-09 DIAGNOSIS — R296 Repeated falls: Secondary | ICD-10-CM | POA: Diagnosis not present

## 2020-09-09 LAB — BASIC METABOLIC PANEL
Anion gap: 10 (ref 5–15)
BUN: 16 mg/dL (ref 8–23)
CO2: 25 mmol/L (ref 22–32)
Calcium: 9.7 mg/dL (ref 8.9–10.3)
Chloride: 95 mmol/L — ABNORMAL LOW (ref 98–111)
Creatinine, Ser: 0.91 mg/dL (ref 0.61–1.24)
GFR, Estimated: >60 mL/min (ref >60)
Glucose, Bld: 128 mg/dL — ABNORMAL HIGH (ref 70–99)
Potassium: 4 mmol/L (ref 3.5–5.1)
Sodium: 130 mmol/L — ABNORMAL LOW (ref 135–145)

## 2020-09-09 MED ORDER — AMLODIPINE BESYLATE 5 MG PO TABS: 2.5000 mg | ORAL_TABLET | Freq: Every day | ORAL | Status: DC

## 2020-09-09 MED ORDER — CEPHALEXIN 500 MG PO CAPS: 500.0000 mg | ORAL_CAPSULE | Freq: Three times a day (TID) | ORAL | Status: AC

## 2020-09-09 MED ORDER — ENSURE ENLIVE PO LIQD: 237.0000 mL | Freq: Two times a day (BID) | ORAL | Status: DC

## 2020-09-09 MED ORDER — BISACODYL 5 MG PO TBEC: 10.0000 mg | DELAYED_RELEASE_TABLET | Freq: Every day | ORAL | Status: DC | PRN

## 2020-09-09 MED ORDER — POLYETHYLENE GLYCOL 3350 17 G PO PACK: 17.0000 g | PACK | Freq: Every day | ORAL | Status: DC

## 2020-09-09 MED ORDER — ADULT MULTIVITAMIN W/MINERALS CH: 1.0000 | ORAL_TABLET | Freq: Every day | ORAL | Status: DC

## 2020-09-09 MED ORDER — SENNOSIDES-DOCUSATE SODIUM 8.6-50 MG PO TABS
1.0000 | ORAL_TABLET | Freq: Two times a day (BID) | ORAL | Status: DC
Start: 2020-09-09 — End: 2021-01-10

## 2020-09-09 NOTE — Care Management Important Message (Signed)
Important Message  Patient Details  Name: Caleb [name] MRN: [id number] Date of Birth: [birth year]   Medicare Important Message Given:  Yes     Shelda Altes 09/09/2020, 10:27 AM

## 2020-09-09 NOTE — Discharge Instructions (Signed)

## 2020-09-09 NOTE — TOC Progression Note (Signed)
Transition of Care [facility]) - Progression Note    Patient Details  Name: Caleb [name] MRN: [id number] Date of Birth: [birth year]  Transition of Care [facility]) CM/SW [place], [place] Phone Number: 09/09/2020, 9:59 AM  Clinical Narrative:     CSW received insurance authorization approval for patient. Plan auth ID# [id number]. [place] # [phone]. Start state is 8/25. Next review date is 8/29. Patient has SNF bed at [facility]. CSW informed MD. CSW will continue to follow and assist with dc planning needs.  Expected Discharge Plan: [place] Barriers to Discharge: Continued Medical Work up  Expected Discharge Plan and Services Expected Discharge Plan: [place] In-house Referral: Clinical Social Work Discharge Planning Services: CM Consult Post Acute Care Choice: [place] arrangements for the past 2 months: Single Family Home                 DME Arranged: N/A         HH Arranged: PT HH Agency: [place] Date [place]: 09/05/20 Time [facility]: [place] Representative spoke with at [place]: [place] ([place]) Interventions    Readmission Risk Interventions No flowsheet data found.

## 2020-09-09 NOTE — TOC Transition Note (Signed)
Transition of Care [facility]) - CM/SW Discharge Note   Patient Details  Name: Caleb [name] MRN: [id number] Date of Birth: March 13, 1950  Transition of Care [facility]) CM/SW Contact:  Trula Ore, [place] Phone Number: 09/09/2020, 12:56 PM   Clinical Narrative:     Patient will DC to: [facility]  Anticipated DC date: 09/09/2020  Family notified: Levada Dy   Transport by: Corey Harold  ?  Per MD patient ready for DC to [facility] . RN, patient, patient's family, and facility notified of DC. Discharge Summary sent to facility. RN given number for report tele# [phone] RM# [phone]. DC packet on chart. Ambulance transport requested for patient.  CSW signing off.   Final next level of care: Skilled Nursing Facility Barriers to Discharge: No Barriers Identified   Patient Goals and CMS Choice Patient states their goals for this hospitalization and ongoing recovery are:: to go to SNF CMS Medicare.gov Compare Post Acute Care list provided to:: Patient Represenative (must comment) (patient and patients daughter Levada Dy) Choice offered to / list presented to : Patient, Adult Children (patient and daughter Levada Dy)  Discharge Placement              Patient [facility] bed at: [facility] Patient to be transferred to facility by: [place] Name of family member notified: Levada Dy Patient and family notified of of transfer: 09/09/20  Discharge Plan and Services In-house Referral: Clinical Social Work Discharge Planning Services: CM Consult Post Acute Care Choice: Home Health          DME Arranged: N/A         HH Arranged: PT HH Agency: [place] Date [facility] Agency Contacted: 09/05/20 Time [place]: [place] Representative spoke with at [place]: [place] ([place]) Interventions     Readmission Risk Interventions No flowsheet data found.

## 2020-09-09 NOTE — Discharge Summary (Signed)
Physician Discharge Summary  CHALRES VIVES [id number] DOB: 26-Feb-1950  PCP: Percell Belt, DO  Admitted from: Home Discharged to: SNF  Admit date: 09/04/2020 Discharge date: 09/09/2020  Recommendations for Outpatient Follow-up:    Follow-up Information     [facility], [facility] Follow up.   Specialty: Home Health Services Why: Physical Therapy-office to call with visit times. Contact information: [address] [address] [phone]         MD at SNF Follow up.   Why: To be seen in 2 to 3 days with repeat labs (CBC & BMP).  Kindly monitor blood pressures closely including orthostatic vitals and adjust antihypertensives as needed.  Outpatient nephrology follow-up with [facility], as deemed necessary.        Edrick Kins, DPM. Schedule an appointment as soon as possible for a visit in 1 week(s).   Specialty: Podiatry Contact information: [address] [place] [address] [phone]                  Home Health: None    Equipment/Devices: TBD at SNF    Discharge Condition: Improved and stable   Code Status: Full Code Diet recommendation:  Discharge Diet Orders (From admission, onward)     Start     Ordered   09/09/20 0000  Diet - low sodium heart healthy        09/09/20 1123             Discharge Diagnoses:  Principal Problem:   Acute hyponatremia Active Problems:   COPD (chronic obstructive pulmonary disease) ([place])   Essential hypertension   S/P CABG x 3   Chronic anticoagulation   Hyponatremia   Malnutrition of moderate degree   Brief Summary: 70 year old male with PMH of CAD s/p CABG, chronic systolic CHF with last EF 45-50% and grade 2 diastolic dysfunction in July 2021, PAD s/p femoropopliteal bypass, ongoing tobacco abuse, alcohol use disorder, COPD, chronic hyponatremia, was referred to the ED after patient's recent serum sodium done as outpatient 2 days prior to  admission was 114.  He reported feeling weak at times and confused as per patient's daughter.  He also recently hit his leg while taking laundry out of the washing machine and had falls without head injury or LOC.  Recently his Lasix dose was increased due to leg swelling.  In ED, lab work showed serum sodium of 116, potassium 3, serum osmolarity 245, urine osmolarity 241, CT head unremarkable, x-ray of right foot negative for fracture.  Nephrology consulted, briefly on IV saline hydration, serum sodium has gradually improved up to 130.     Assessment & Plan:   Subacute on chronic hyponatremia: Likely multifactorial related to poor oral intake, beer intake, recent increase in Lasix.  Nephrology input appreciated.  Briefly on IV saline hydration, BMP was closely monitored.  Picture not consistent with SIADH. Encourage oral intake.  Per nephrology, salt tablets do not help.  Cymbalta which had been recently started outpatient was [facility] 8/23.  AMS resolved.  Serum sodium has gradually improved up to 130.  Per patient's preference, outpatient follow-up with Nephrology as needed.  As per nephrology, hyponatremia primarily low solute driven leading to impaired free water excretion.  Continue to push solutes, limit fluids to 1.2 L total daily input.   Acute metabolic encephalopathy: Resolved.  May be multifactorial.  Unsure if hyponatremia by itself was contributing to it all.     Frequent  falls: Multifactorial due to deconditioning, left heel wound, was wheelchair-bound for a while until recently due to the heel wound then only started ambulating recently, alcohol and orthostatic hypotension etc.  Daughter states that she is unable to provide the 24/7 supervision or assistance even though she lives next door.  Patient being discharged to SNF for rehab.   Alcohol dependence: Reports drinking up to 6 beers daily.  No overt withdrawal.  Abstinence counseled multiple times and he verbalized understanding..      Hypokalemia: Replaced.  Magnesium 1.9.   Hypophosphatemia: Due to poor oral intake.  Recurrent.  Replaced.   Chronic systolic CHF: Compensated.  As discussed with nephrology, discontinue Lasix for now.  Reassess during outpatient follow-up.  Euvolemic.   Essential hypertension/orthostatic hypotension:  Continue amlodipine at reduced dose, carvedilol and irbesartan.  Ongoing significant orthostatic blood pressure changes with supine hypertension.  Today supine BP 188/69, pulse 66; sitting BP 159/79, pulse 64; standing BP 117/87, pulse 77.  No syncope.  Euvolemic.  Continue bilateral thigh-high compression stockings.  Above blood pressures despite reducing amlodipine from 5 mg to 2.5 Mg daily yesterday.  Patient asymptomatic.  As discussed with nephrology, continue current antihypertensives with close and frequent monitoring of orthostatic vitals at SNF and adjust meds as needed.  CAD s/p CABG: No anginal symptoms.  Continue beta-blockers, statins and aspirin.  Patient also on low-dose Xarelto.   PAD s/p bypass: Chronic nonhealing left heel wound but not infected.  Patient on aspirin, statins and low-dose Xarelto.   Right second toe laceration: Podiatry input appreciated.  They do not see benefit now off primary closure with sutures since this occurred 2 days prior to admission.  They indicate no infection.  Patient however had fever up to 102 on 8/21.  Outpatient follow-up with Dr. Daylene Katayama.  S/p tetanus booster in ED. Was on IV Ancef, now switched to Keflex to complete total 7 days course.  Podiatry has seen in follow-up yesterday and the ecchymosis around the toe appears to have improved.  Recommend follow-up in 1 week in podiatry office.   Fever: Unclear etiology.  Urine microscopy not consistent with UTI.  Chest x-ray without acute findings.  Antibiotics as above.  Blood cultures: NTD.  Urine culture negative.  Resolved without recurrence.   COPD: Stable   Tobacco abuse: Cessation  counseled.   Body mass index is 24.31 kg/m  Nutrition Status: Nutrition Problem: Moderate Malnutrition Etiology: social / environmental circumstances, chronic illness (alcohol use disorder, COPD, CHF) Signs/Symptoms: mild muscle depletion, moderate muscle depletion, mild fat depletion, moderate fat depletion Interventions: Ensure Enlive (each supplement provides 350kcal and 20 grams of protein), MVI         Consultants:   Nephrology Podiatry.   Procedures:   None.   Discharge Instructions  Discharge Instructions     Call MD for:  difficulty breathing, headache or visual disturbances   Complete by: As directed    Call MD for:  extreme fatigue   Complete by: As directed    Call MD for:  persistant dizziness or light-headedness   Complete by: As directed    Call MD for:  persistant nausea and vomiting   Complete by: As directed    Call MD for:  redness, tenderness, or signs of infection (pain, swelling, redness, odor or green/yellow discharge around incision site)   Complete by: As directed    Call MD for:  severe uncontrolled pain   Complete by: As directed    Call MD for:  temperature >100.4   Complete by: As directed    Diet - low sodium heart healthy   Complete by: As directed    Discharge instructions   Complete by: As directed    Patient to wear bilateral thigh-high compression stockings while up and about.  Also to follow precautions related to orthostatic hypotension regarding gradual transitioning activity from supine to sitting or sitting to standing and ambulating.   Wound details:  1) Wound / Incision (Open or Dehisced) 09/04/20 Diabetic ulcer Heel Left Scant yellow drainage noted on previous dressing 2) Wound / Incision (Open or Dehisced) 09/04/20 Laceration Toe (Comment  which one) Right Laceration to 2nd toe at distal joint, bleeding controlled with combat gauze   Discharge wound care:   Complete by: As directed    Wound care  Every shift       Comments: Wound care to right great toe (trauma injury): Cleanse with NS, pat dry. Cover with xeroform gauze, top with dry gauze and secure with conform bandaging/paper tape.  Change once daily and PRN drainage     Wound care  Every shift      Comments: Wound care to left heel (healing Stage 4 pressure injury):  Cleanse with soap and water, rinse and pat dry. Paint with betadine, allow to air dry. Dress with dry gauze dressing, secure with a few turns of Kerlix roll gauze/paper tape.  Place feet into Prevalon Boots   Increase activity slowly   Complete by: As directed         Medication List     STOP taking these medications    DULoxetine 30 MG capsule Commonly known as: CYMBALTA   furosemide 20 MG tablet Commonly known as: LASIX   sodium chloride 1 g tablet   traMADol 50 MG tablet Commonly known as: ULTRAM       TAKE these medications    acetaminophen 500 MG tablet Commonly known as: TYLENOL Take 1,000 mg by mouth 2 (two) times daily as needed for mild pain or moderate pain.   albuterol 108 (90 Base) MCG/ACT inhaler Commonly known as: VENTOLIN HFA Inhale 2 puffs into the lungs every 6 (six) hours as needed for wheezing or shortness of breath.   amLODipine 5 MG tablet Commonly known as: NORVASC Take 0.5 tablets (2.5 mg total) by mouth daily. What changed: how much to take   aspirin EC 81 MG tablet Take 81 mg by mouth daily. Swallow whole.   atorvastatin 80 MG tablet Commonly known as: LIPITOR Take 1 tablet (80 mg total) by mouth daily.   bisacodyl 5 MG EC tablet Commonly known as: DULCOLAX Take 2 tablets (10 mg total) by mouth daily as needed for moderate constipation.   carvedilol 25 MG tablet Commonly known as: COREG Take 25 mg by mouth 2 (two) times daily with a meal.   cephALEXin 500 MG capsule Commonly known as: KEFLEX Take 1 capsule (500 mg total) by mouth 3 (three) times daily for 3 days.   feeding supplement Liqd Take 237 mLs by mouth 2 (two)  times daily between meals.   ferrous sulfate 325 (65 FE) MG tablet Take 1 tablet (325 mg total) by mouth daily with breakfast.   folic acid 1 MG tablet Commonly known as: FOLVITE Take 1 mg by mouth daily.   gabapentin 300 MG capsule Commonly known as: NEURONTIN Take 300 mg by mouth 3 (three) times daily.   irbesartan 300 MG tablet Commonly known as: AVAPRO Take 1 tablet (300 mg total)  by mouth daily.   multivitamin with minerals Tabs tablet Take 1 tablet by mouth daily. Start taking on: September 10, 2020   nitroGLYCERIN 0.4 MG SL tablet Commonly known as: NITROSTAT Place 1 tablet (0.4 mg total) under the tongue every 5 (five) minutes as needed for chest pain.   pantoprazole 40 MG tablet Commonly known as: PROTONIX Take 40 mg by mouth daily.   PATADAY OP Place 1 drop into both eyes daily as needed (allergies).   polyethylene glycol 17 g packet Commonly known as: MIRALAX / GLYCOLAX Take 17 g by mouth daily. Start taking on: September 10, 2020   Prolia 60 MG/ML Sosy injection Generic drug: denosumab Inject 1 mL into the skin every 6 (six) months.   rivaroxaban 2.5 MG Tabs tablet Commonly known as: XARELTO Take 1 tablet (2.5 mg total) by mouth 2 (two) times daily.   senna-docusate 8.6-50 MG tablet Commonly known as: Senokot-S Take 1 tablet by mouth 2 (two) times daily.   thiamine 100 MG tablet Take 100 mg by mouth daily.   vitamin B-12 1000 MCG tablet Commonly known as: CYANOCOBALAMIN Take 1,000 mcg by mouth daily with breakfast.   VITAMIN D-3 PO Take 1,000 Units by mouth daily with breakfast.       Allergies  Allergen Reactions   Sulfamethoxazole-Trimethoprim Other (See Comments)    "Made potassium go high and sodium go low"   Cilostazol Swelling and Other (See Comments)    Site of swelling not recalled by the patient   Lisinopril Cough and Other (See Comments)   Losartan Swelling    Pt's daughter stated it made pt's blood pressure and sodium too low, but  pt has been tolerating irbesartan.      Procedures/Studies: CT HEAD WO CONTRAST (5MM)  Result Date: 09/04/2020 CLINICAL DATA:  Head trauma EXAM: CT HEAD WITHOUT CONTRAST TECHNIQUE: Contiguous axial images were obtained from the base of the skull through the vertex without intravenous contrast. COMPARISON:  None. FINDINGS: Brain: There is atrophy and chronic small vessel disease changes. No acute intracranial abnormality. Specifically, no hemorrhage, hydrocephalus, mass lesion, acute infarction, or significant intracranial injury. Chronic appearing lacunar infarct in the left thalamus. Vascular: No hyperdense vessel or unexpected calcification. Skull: No acute calvarial abnormality. Sinuses/Orbits: No acute findings Other: No IMPRESSION: Old left thalamic lacunar infarct. Atrophy, chronic microvascular disease. No acute intracranial abnormality. Electronically Signed   By: Rolm Baptise M.D.   On: 09/04/2020 20:53   DG CHEST PORT 1 VIEW  Result Date: 09/05/2020 CLINICAL DATA:  Fever EXAM: PORTABLE CHEST 1 VIEW COMPARISON:  08/11/2019 FINDINGS: Cardiomegaly. Prior CABG. Large lung volumes with diaphragm flattening. There is no edema, consolidation, effusion, or pneumothorax. Remote left more than right rib fractures with healed deformity. IMPRESSION: COPD without acute superimposed finding. Electronically Signed   By: Monte Fantasia M.D.   On: 09/05/2020 04:44   DG Foot Complete Right  Result Date: 09/04/2020 CLINICAL DATA:  Toe injury.  Hit 2nd toe on wall EXAM: RIGHT FOOT COMPLETE - 3+ VIEW COMPARISON:  None. 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 No acute fracture, subluxation or dislocation. Corticated bone fragment noted at the base of the right 2nd toe proximal phalanx likely related to old injury. IMPRESSION: No visible acute bony abnormality. Electronically Signed   By: Rolm Baptise M.D.   On: 09/04/2020 15:12      Subjective: Denies complaints.  Specifically denies  dizziness, lightheadedness or feeling like passing out when he is sitting up, standing or walking.  Discharge Exam:  Vitals:   09/09/20 0418 09/09/20 0428 09/09/20 0834 09/09/20 1016  BP:  (!) 177/71 (!) 173/67 (!) 153/48  Pulse:  65 67   Resp:  16 18   Temp:  98.3 F (36.8 C) 97.8 F (36.6 C)   TempSrc:  Oral Oral   SpO2:  99% 96%   Weight: 62.7 kg     Height:        General exam: Elderly male, moderately built, frail and chronically ill lying comfortably supine in bed.  Oral mucosa moist. Respiratory system: Clear to auscultation.  No increased work of breathing. Cardiovascular system: S1 and S2 heard, RRR.  No JVD, murmurs or pedal edema. Gastrointestinal system: Abdomen is nondistended, soft and nontender. No organomegaly or masses felt. Normal bowel sounds heard. Central nervous system: Alert and oriented.  No focal neurological deficits. Extremities: Symmetric 5 x 5 power.  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  Blister/chronic.  Laceration over medial aspect of right second toe with gauze stuck to dry blood.  The right toe looks bruised but improved compared to before.  No clinical features consistent with cellulitis.  Left heel with tiny superficial ulcer with no acute findings. Skin: No rashes, lesions or ulcers Psychiatry: Judgement and insight appear normal. Mood & affect flat.       The results of significant diagnostics from this hospitalization (including imaging, microbiology, ancillary and laboratory) are listed below for reference.     Microbiology: Recent Results (from the past 240 hour(s))  SARS CORONAVIRUS 2 (TAT 6-24 HRS) Nasopharyngeal Nasopharyngeal Swab     Status: None   Collection Time: 09/04/20  9:22 PM   Specimen: Nasopharyngeal Swab  Result Value Ref Range Status   SARS Coronavirus 2 NEGATIVE NEGATIVE Final    Comment: (NOTE) SARS-CoV-2 target nucleic acids are NOT DETECTED.  The SARS-CoV-2 RNA is generally detectable in  upper and lower respiratory specimens during the acute phase of infection. Negative results do not preclude SARS-CoV-2 infection, do not rule out co-infections with other pathogens, and should not be used as the sole basis for treatment or other patient management decisions. Negative results must be combined with clinical observations, patient history, and epidemiological information. The expected result is Negative.  Fact Sheet for Patients: SugarRoll.be  Fact Sheet for Healthcare Providers: https://www.woods-mathews.com/  This test is not yet approved or cleared by the [place] FDA and  has been authorized for detection and/or diagnosis of SARS-CoV-2 by FDA under an Emergency Use Authorization (EUA). This EUA will remain  in effect (meaning this test can be used) for the duration of the COVID-19 declaration under Se ction 564(b)(1) of the Act, 21 U.S.C. section 360bbb-3(b)(1), unless the authorization is terminated or revoked sooner.  Performed at [facility], [place] [address] [place], [place] [address]   Urine Culture     Status: None   Collection Time: 09/05/20  4:36 AM   Specimen: Urine, Clean Catch  Result Value Ref Range Status   Specimen Description URINE, CLEAN CATCH  Final   Special Requests NONE  Final   Culture   Final    NO GROWTH Performed at [facility], [place] [address], [place], [place] [address]    Report Status 09/06/2020 FINAL  Final  Culture, blood (routine x 2)     Status: None (Preliminary result)   Collection Time: 09/05/20  6:14 AM   Specimen: BLOOD RIGHT HAND  Result Value Ref Range Status  Specimen Description BLOOD RIGHT HAND  Final   Special Requests   Final    BOTTLES DRAWN AEROBIC AND ANAEROBIC Blood Culture results may not be optimal due to an excessive volume of blood received in culture bottles   Culture   Final    NO GROWTH 4 DAYS Performed at [facility], [place] [address], [address], [place] [address]    Report Status PENDING  Incomplete  Culture, blood (routine x 2)     Status: None (Preliminary result)   Collection Time: 09/05/20  6:15 AM   Specimen: BLOOD LEFT HAND  Result Value Ref Range Status   Specimen Description BLOOD LEFT HAND  Final   Special Requests   Final    BOTTLES DRAWN AEROBIC AND ANAEROBIC Blood Culture results may not be optimal due to an excessive volume of blood received in culture bottles   Culture   Final    NO GROWTH 4 DAYS Performed at [facility], [place] [address] [place], [place] [address]    Report Status PENDING  Incomplete  SARS CORONAVIRUS 2 (TAT 6-24 HRS) Nasopharyngeal Nasopharyngeal Swab     Status: None   Collection Time: 09/08/20  1:27 PM   Specimen: Nasopharyngeal Swab  Result Value Ref Range Status   SARS Coronavirus 2 NEGATIVE NEGATIVE Final    Comment: (NOTE) SARS-CoV-2 target nucleic acids are NOT DETECTED.  The SARS-CoV-2 RNA is generally detectable in upper and lower respiratory specimens during the acute phase of infection. Negative results do not preclude SARS-CoV-2 infection, do not rule out co-infections with other pathogens, and should not be used as the sole basis for treatment or other patient management decisions. Negative results must be combined with clinical observations, patient history, and epidemiological information. The expected result is Negative.  Fact Sheet for Patients: SugarRoll.be  Fact Sheet for Healthcare Providers: https://www.woods-mathews.com/  This test is not yet approved or cleared by the [place] FDA and  has been authorized for detection and/or diagnosis of SARS-CoV-2 by FDA under an Emergency Use Authorization (EUA). This EUA will remain  in effect (meaning this test can be used) for the duration of the COVID-19 declaration under Se ction 564(b)(1) of the Act, 21 U.S.C. section 360bbb-3(b)(1), unless the authorization  is terminated or revoked sooner.  Performed at [facility], [place] [address] [place],  [address]      Labs: CBC: Recent Labs  Lab 09/04/20 1712 09/06/20 0541  WBC 9.3 9.1  NEUTROABS 7.9*  --   HGB 13.8 13.1  HCT 36.8* 35.1*  MCV 90.0 91.2  PLT 155 [id number]    Basic Metabolic Panel: Recent Labs  Lab 09/05/20 0318 09/05/20 0635 09/05/20 1651 09/05/20 1735 09/06/20 0541 09/06/20 1603 09/07/20 0210 09/07/20 1532 09/08/20 0241 09/09/20 0125  NA 120*   < > 120* 120* 122* 123* 124* 125* 127* 130*  K 2.7*   < > 3.8 3.7 3.4*  --  4.2  --  4.0 4.0  CL 88*   < > 91* 90* 90*  --  92*  --  95* 95*  CO2 23   < > 19* 21* 23  --  23  --  25 25  GLUCOSE 156*   < > 124* 158* 120*  --  115*  --  110* 128*  BUN 5*   < > <5* <5* 5*  --  12  --  13 16  CREATININE 0.58*   < > 0.61 0.65 0.68  --  0.99  --  0.80 0.91  CALCIUM 8.5*   < > 9.1 9.0 8.9  --  9.1  --  9.5 9.7  MG 1.9  --   --   --   --   --  1.9  --   --   --   PHOS 1.2*   < > 2.4* 2.8 2.6  --  2.0*  --  3.1  --    < > = values in this interval not displayed.    Liver Function Tests: Recent Labs  Lab 09/05/20 1651 09/05/20 1735 09/06/20 0541 09/07/20 0210 09/08/20 0241  ALBUMIN 3.3* 3.2* 3.3* 3.3* 3.3*     Urinalysis    Component Value Date/Time   COLORURINE YELLOW 09/05/2020 0440   APPEARANCEUR CLEAR 09/05/2020 0440   LABSPEC 1.006 09/05/2020 0440   PHURINE 8.0 09/05/2020 0440   GLUCOSEU 50 (A) 09/05/2020 0440   HGBUR NEGATIVE 09/05/2020 0440   BILIRUBINUR NEGATIVE 09/05/2020 0440   KETONESUR NEGATIVE 09/05/2020 0440   PROTEINUR 30 (A) 09/05/2020 0440   UROBILINOGEN 0.2 02/13/2014 1645   NITRITE NEGATIVE 09/05/2020 0440   LEUKOCYTESUR NEGATIVE 09/05/2020 0440      Time coordinating discharge: 25 minutes  SIGNED:  Vernell Leep, MD, FACP, [facility]. [facility]  To contact the attending provider between 7A-7P or the covering provider during after hours 7P-7A, please log into the web  site [email or web address] and access using universal [place] password for that web site. If you do not have the password, please call the hospital operator.

## 2020-09-10 LAB — CULTURE, BLOOD (ROUTINE X 2)
Culture: NO GROWTH
Culture: NO GROWTH

## 2020-10-25 ENCOUNTER — Other Ambulatory Visit (HOSPITAL_COMMUNITY): Payer: Self-pay | Admitting: Cardiovascular Disease

## 2020-10-25 DIAGNOSIS — I701 Atherosclerosis of renal artery: Secondary | ICD-10-CM

## 2020-11-03 ENCOUNTER — Other Ambulatory Visit: Payer: Self-pay

## 2020-11-03 ENCOUNTER — Ambulatory Visit (HOSPITAL_COMMUNITY)
Admission: RE | Admit: 2020-11-03 | Discharge: 2020-11-03 | Disposition: A | Discharge status: Home / Self Care | Payer: Medicare HMO | Source: Ambulatory Visit | Attending: Cardiology | Admitting: Cardiology

## 2020-11-03 DIAGNOSIS — I701 Atherosclerosis of renal artery: Secondary | ICD-10-CM | POA: Diagnosis not present

## 2020-12-02 ENCOUNTER — Ambulatory Visit: Payer: Medicare HMO | Admitting: Cardiovascular Disease

## 2020-12-02 ENCOUNTER — Other Ambulatory Visit: Payer: Self-pay

## 2020-12-02 ENCOUNTER — Encounter: Payer: Self-pay | Admitting: Cardiovascular Disease

## 2020-12-02 VITALS — BP 136/60 | HR 72 | Ht 64.0 in | Wt 147.6 lb

## 2020-12-02 DIAGNOSIS — I701 Atherosclerosis of renal artery: Secondary | ICD-10-CM

## 2020-12-02 DIAGNOSIS — Z951 Presence of aortocoronary bypass graft: Secondary | ICD-10-CM

## 2020-12-02 DIAGNOSIS — I25810 Atherosclerosis of coronary artery bypass graft(s) without angina pectoris: Secondary | ICD-10-CM | POA: Diagnosis not present

## 2020-12-02 NOTE — Progress Notes (Signed)
12/02/2020 Caleb [name]   11/06/1950  [id number]  Primary Physician Percell Belt, DO Primary Cardiologist: Lorretta Harp MD Garret Reddish, [place], [place]  HPI:  Caleb [name] is a 70 y.o.  recently widowed (second wife died 2023/01/26) father of 2, grandfather one grandchild who is accompanied by his daughter Caleb [name] today.  Referred by Dr. Johnsie Cancel for peripheral vascular evaluation because of duplex evidence of moderate left renal artery stenosis.  I last saw him in the office 10/22/2017.  He continues to work doing Actuary.  His risk factors include ongoing tobacco abuse of 1/2 pack/day treated hypertension hyperlipidemia.  He did have coronary artery bypass grafting 02/17/2014 with bilateral IMA's with recent echo that showed normal LV function.  He had a right femoropopliteal bypass graft at [facility] 3/19 because of critical limb ischemia.  He currently denies chest pain, shortness of breath does have mild claudication.  Renal Dopplers performed 01/10/2016 revealed a left RAR 3.03 with a pole-to-pole dimension of 11 cm.  His most recent renal Doppler study performed 09/06/2017 revealed a left R AR of 4 with unchanged renal dimensions.  Blood pressures under fairly good control at home on 2 medications.  Since I saw him 3 years ago his most recent renal Dopplers performed 11/03/2020 revealed an increase in his left renal aortic ratio from 4-6 although his renal dimensions have remained stable and his blood pressures are under good control.     Current Meds  Medication Sig   acetaminophen (TYLENOL) 500 MG tablet Take 1,000 mg by mouth 2 (two) times daily as needed for mild pain or moderate pain.    albuterol (VENTOLIN HFA) 108 (90 Base) MCG/ACT inhaler Inhale 2 puffs into the lungs every 6 (six) hours as needed for wheezing or shortness of breath.   amLODipine (NORVASC) 5 MG tablet Take 0.5 tablets (2.5 mg total) by mouth daily.   aspirin EC 81 MG tablet Take 81 mg by  mouth daily. Swallow whole.   atorvastatin (LIPITOR) 80 MG tablet Take 1 tablet (80 mg total) by mouth daily.   carvedilol (COREG) 25 MG tablet Take 25 mg by mouth 2 (two) times daily with a meal.   Cholecalciferol (VITAMIN D-3 PO) Take 1,000 Units by mouth daily with breakfast.   denosumab (PROLIA) 60 MG/ML SOSY injection Inject 1 mL into the skin every 6 (six) months.   feeding supplement (ENSURE ENLIVE / ENSURE PLUS) LIQD Take 237 mLs by mouth 2 (two) times daily between meals.   ferrous sulfate 325 (65 FE) MG tablet Take 1 tablet (325 mg total) by mouth daily with breakfast.   folic acid (FOLVITE) 1 MG tablet Take 1 mg by mouth daily.   gabapentin (NEURONTIN) 300 MG capsule Take 300 mg by mouth 3 (three) times daily.   irbesartan (AVAPRO) 300 MG tablet Take 1 tablet (300 mg total) by mouth daily.   Multiple Vitamin (MULTIVITAMIN WITH MINERALS) TABS tablet Take 1 tablet by mouth daily.   nitroGLYCERIN (NITROSTAT) 0.4 MG SL tablet Place 1 tablet (0.4 mg total) under the tongue every 5 (five) minutes as needed for chest pain.   Olopatadine HCl (PATADAY OP) Place 1 drop into both eyes daily as needed (allergies).   pantoprazole (PROTONIX) 40 MG tablet Take 40 mg by mouth daily.   rivaroxaban (XARELTO) 2.5 MG TABS tablet Take 1 tablet (2.5 mg total) by mouth 2 (two) times daily.   thiamine 100 MG tablet Take 100 mg by  mouth daily.   vitamin B-12 (CYANOCOBALAMIN) 1000 MCG tablet Take 1,000 mcg by mouth daily with breakfast.     Allergies  Allergen Reactions   Sulfamethoxazole-Trimethoprim Other (See Comments)    "Made potassium go high and sodium go low"   Cilostazol Swelling and Other (See Comments)    Site of swelling not recalled by the patient   Lisinopril Cough and Other (See Comments)   Losartan Swelling    Pt's daughter stated it made pt's blood pressure and sodium too low, but pt has been tolerating irbesartan.    Social History   Socioeconomic History   Marital status: Single     Spouse name: Not on file   Number of children: Not on file   Years of education: Not on file   Highest education level: Not on file  Occupational History   Occupation: metal work  Tobacco Use   Smoking status: Every Day    Packs/day: 0.50    Years: 35.00    Pack years: 17.50    Types: Cigarettes    Last attempt to quit: 02/11/2014    Years since quitting: 6.8   Smokeless tobacco: Never   Tobacco comments:    has quit twice before   Vaping Use   Vaping Use: Never used  Substance and Sexual Activity   Alcohol use: Yes    Alcohol/week: 0.0 standard drinks    Comment: Six pack a day of beer   Drug use: Never   Sexual activity: Not on file  Other Topics Concern   Not on file  Social History Narrative   Not on file   Social Determinants of Health   Financial Resource Strain: Not on file  Food Insecurity: Not on file  Transportation Needs: Not on file  Physical Activity: Not on file  Stress: Not on file  Social Connections: Not on file  Intimate Partner Violence: Not on file     Review of Systems: General: negative for chills, fever, night sweats or weight changes.  Cardiovascular: negative for chest pain, dyspnea on exertion, edema, orthopnea, palpitations, paroxysmal nocturnal dyspnea or shortness of breath Dermatological: negative for rash Respiratory: negative for cough or wheezing Urologic: negative for hematuria Abdominal: negative for nausea, vomiting, diarrhea, bright red blood per rectum, melena, or hematemesis Neurologic: negative for visual changes, syncope, or dizziness All other systems reviewed and are otherwise negative except as noted above.    Blood pressure 136/60, pulse 72, height 5\' 4"  (1.626 m), weight 147 lb 9.6 oz (67 kg), SpO2 98 %.  General appearance: alert and no distress Neck: no adenopathy, no carotid bruit, no JVD, supple, symmetrical, trachea midline, and thyroid not enlarged, symmetric, no tenderness/mass/nodules Lungs: clear to  auscultation bilaterally Heart: regular rate and rhythm, S1, S2 normal, no murmur, click, rub or gallop Extremities: extremities normal, atraumatic, no cyanosis or edema Pulses: 2+ and symmetric Skin: Skin color, texture, turgor normal. No rashes or lesions Neurologic: Grossly normal  EKG sinus rhythm at 72 without ST or T wave changes.  There were some small inferior Q waves noted.  I personally reviewed this EKG.  ASSESSMENT AND PLAN:   Renal artery stenosis [facility]) Mr. Bohr has a history of left renal artery stenosis.  We have been following him by duplex ultrasound on annual basis.  This was most recently done 11/03/2020 revealing an increase in his left renal aortic ratio up to 6.2 although his renal dimensions have remained stable and his blood pressures under good control.  There  is no indication for intervention at this time.  We will continue to follow him noninvasively on an annual basis.     Lorretta Harp MD FACP,FACC,FAHA, [facility] 12/02/2020 10:39 AM

## 2020-12-02 NOTE — Patient Instructions (Signed)
Medication Instructions:  Your physician recommends that you continue on your current medications as directed. Please refer to the Current Medication list given to you today.  *If you need a refill on your cardiac medications before your next appointment, please call your pharmacy*   Testing/Procedures: Your physician has requested that you have a renal artery duplex. During this test, an ultrasound is used to evaluate blood flow to the kidneys. Allow one hour for this exam. Do not eat after midnight the day before and avoid carbonated beverages. Take your medications as you usually do. To be done in Oct. 2023. This procedure is done at [place].   Follow-Up: At [facility], you and your health needs are our priority.  As part of our continuing mission to provide you with exceptional heart care, we have created designated Provider Care Teams.  These Care Teams include your primary Cardiologist (physician) and Advanced Practice Providers (APPs -  Physician Assistants and Nurse Practitioners) who all work together to provide you with the care you need, when you need it.  We recommend signing up for the patient portal called "MyChart".  Sign up information is provided on this After Visit Summary.  MyChart is used to connect with patients for Virtual Visits (Telemedicine).  Patients are able to view lab/test results, encounter notes, upcoming appointments, etc.  Non-urgent messages can be sent to your provider as well.   To learn more about what you can do with MyChart, go to [email or web address].    Your next appointment:   We will see you on an as needed basis.  Provider: Quay Burow, MD

## 2020-12-02 NOTE — Assessment & Plan Note (Signed)
Caleb [name] has a history of left renal artery stenosis.  We have been following him by duplex ultrasound on annual basis.  This was most recently done 11/03/2020 revealing an increase in his left renal aortic ratio up to 6.2 although his renal dimensions have remained stable and his blood pressures under good control.  There is no indication for intervention at this time.  We will continue to follow him noninvasively on an annual basis.

## 2020-12-05 ENCOUNTER — Other Ambulatory Visit: Payer: Self-pay | Admitting: Gastroenterology

## 2020-12-20 ENCOUNTER — Other Ambulatory Visit (HOSPITAL_COMMUNITY): Payer: Self-pay | Admitting: Cardiovascular Disease

## 2020-12-20 DIAGNOSIS — I6523 Occlusion and stenosis of bilateral carotid arteries: Secondary | ICD-10-CM

## 2021-01-02 ENCOUNTER — Ambulatory Visit (HOSPITAL_COMMUNITY)
Admission: RE | Admit: 2021-01-02 | Discharge: 2021-01-02 | Disposition: A | Discharge status: Home / Self Care | Payer: Medicare HMO | Source: Ambulatory Visit | Attending: Cardiology | Admitting: Cardiology

## 2021-01-02 ENCOUNTER — Other Ambulatory Visit: Payer: Self-pay

## 2021-01-02 DIAGNOSIS — I6523 Occlusion and stenosis of bilateral carotid arteries: Secondary | ICD-10-CM | POA: Diagnosis not present

## 2021-01-10 ENCOUNTER — Encounter (HOSPITAL_COMMUNITY): Payer: Self-pay | Admitting: Gastroenterology

## 2021-01-20 ENCOUNTER — Encounter (HOSPITAL_COMMUNITY)
Admission: RE | Disposition: A | Discharge status: Home / Self Care | Payer: Self-pay | Source: Home / Self Care | Attending: Gastroenterology

## 2021-01-20 ENCOUNTER — Ambulatory Visit (HOSPITAL_COMMUNITY): Payer: Medicare HMO | Admitting: Anesthesiology

## 2021-01-20 ENCOUNTER — Encounter (HOSPITAL_COMMUNITY): Payer: Self-pay | Admitting: Gastroenterology

## 2021-01-20 ENCOUNTER — Other Ambulatory Visit: Payer: Self-pay

## 2021-01-20 ENCOUNTER — Ambulatory Visit (HOSPITAL_COMMUNITY)
Admission: RE | Admit: 2021-01-20 | Discharge: 2021-01-20 | Disposition: A | Discharge status: Home / Self Care | Payer: Medicare HMO | Attending: Gastroenterology | Admitting: Gastroenterology

## 2021-01-20 DIAGNOSIS — Z951 Presence of aortocoronary bypass graft: Secondary | ICD-10-CM | POA: Diagnosis not present

## 2021-01-20 DIAGNOSIS — I110 Hypertensive heart disease with heart failure: Secondary | ICD-10-CM | POA: Insufficient documentation

## 2021-01-20 DIAGNOSIS — D122 Benign neoplasm of ascending colon: Secondary | ICD-10-CM | POA: Insufficient documentation

## 2021-01-20 DIAGNOSIS — Z86010 Personal history of colonic polyps: Secondary | ICD-10-CM | POA: Insufficient documentation

## 2021-01-20 DIAGNOSIS — Z1211 Encounter for screening for malignant neoplasm of colon: Secondary | ICD-10-CM | POA: Insufficient documentation

## 2021-01-20 DIAGNOSIS — J449 Chronic obstructive pulmonary disease, unspecified: Secondary | ICD-10-CM | POA: Diagnosis not present

## 2021-01-20 DIAGNOSIS — I5020 Unspecified systolic (congestive) heart failure: Secondary | ICD-10-CM | POA: Insufficient documentation

## 2021-01-20 DIAGNOSIS — Z7901 Long term (current) use of anticoagulants: Secondary | ICD-10-CM | POA: Diagnosis not present

## 2021-01-20 DIAGNOSIS — I739 Peripheral vascular disease, unspecified: Secondary | ICD-10-CM | POA: Insufficient documentation

## 2021-01-20 DIAGNOSIS — F17210 Nicotine dependence, cigarettes, uncomplicated: Secondary | ICD-10-CM | POA: Diagnosis not present

## 2021-01-20 HISTORY — PX: HEMOSTASIS CLIP PLACEMENT: SHX6857

## 2021-01-20 HISTORY — PX: POLYPECTOMY: SHX5525

## 2021-01-20 HISTORY — PX: COLONOSCOPY WITH PROPOFOL: SHX5780

## 2021-01-20 SURGERY — COLONOSCOPY WITH PROPOFOL
Anesthesia: Monitor Anesthesia Care

## 2021-01-20 MED ORDER — LIDOCAINE 2% (20 MG/ML) 5 ML SYRINGE
INTRAMUSCULAR | Status: DC | PRN
  Administered 2021-01-20: 60 mg via INTRAVENOUS

## 2021-01-20 MED ORDER — PROPOFOL 10 MG/ML IV BOLUS
INTRAVENOUS | Status: DC | PRN
  Administered 2021-01-20: 30 mg via INTRAVENOUS

## 2021-01-20 MED ORDER — LABETALOL HCL 5 MG/ML IV SOLN
10.0000 mg | Freq: Once | INTRAVENOUS | Status: AC
  Administered 2021-01-20: 10 mg via INTRAVENOUS

## 2021-01-20 MED ORDER — PROPOFOL 10 MG/ML IV BOLUS
INTRAVENOUS | Status: AC
  Filled 2021-01-20: qty 20

## 2021-01-20 MED ORDER — PROPOFOL 500 MG/50ML IV EMUL
INTRAVENOUS | Status: DC | PRN
  Administered 2021-01-20: 225 ug/kg/min via INTRAVENOUS

## 2021-01-20 MED ORDER — LABETALOL HCL 5 MG/ML IV SOLN
INTRAVENOUS | Status: AC
  Filled 2021-01-20: qty 4

## 2021-01-20 MED ORDER — SODIUM CHLORIDE 0.9 % IV SOLN: INTRAVENOUS | Status: DC

## 2021-01-20 MED ORDER — LACTATED RINGERS IV SOLN
INTRAVENOUS | Status: AC | PRN
  Administered 2021-01-20: 10 mL/h via INTRAVENOUS

## 2021-01-20 SURGICAL SUPPLY — 22 items

## 2021-01-20 NOTE — Op Note (Signed)
[facility] Patient Name: Caleb [name] Procedure Date: 01/20/2021 MRN: [id number] Attending MD: Carol Ada , MD Date of Birth: 10/13/1950 CSN: [id number] Age: 71 Admit Type: Outpatient Procedure:                Colonoscopy Indications:              High risk colon cancer surveillance: Personal                            history of colonic polyps Providers:                Carol Ada, MD, Vista Lawman, RN, Cherylynn Ridges,                            Technician Referring MD:              Medicines:                Propofol per Anesthesia Complications:            No immediate complications. Estimated Blood Loss:     Estimated blood loss was minimal. Procedure:                Pre-Anesthesia Assessment:                           - Prior to the procedure, a History and Physical                            was performed, and patient medications and                            allergies were reviewed. The patient's tolerance of                            previous anesthesia was also reviewed. The risks                            and benefits of the procedure and the sedation                            options and risks were discussed with the patient.                            All questions were answered, and informed consent                            was obtained. Prior Anticoagulants: The patient has                            taken Eliquis (apixaban), last dose was 2 days                            prior to procedure. ASA Grade Assessment: III - A  patient with severe systemic disease. After                            reviewing the risks and benefits, the patient was                            deemed in satisfactory condition to undergo the                            procedure.                           - Sedation was administered by an anesthesia                            professional. Deep sedation was attained.                           After  obtaining informed consent, the colonoscope                            was passed under direct vision. Throughout the                            procedure, the patient's blood pressure, pulse, and                            oxygen saturations were monitored continuously. The                            PCF-HQ190L ([id number]) Olympus colonoscope was                            introduced through the anus and advanced to the the                            cecum, identified by appendiceal orifice and                            ileocecal valve. The colonoscopy was technically                            difficult and complex. The patient tolerated the                            procedure well. The quality of the bowel                            preparation was evaluated using the BBPS [facility]                            Bowel Preparation Scale) with scores of: Right                            Colon =  3, Transverse Colon = 3 and Left Colon = 3                            (entire mucosa seen well with no residual staining,                            small fragments of stool or opaque liquid). The                            total BBPS score equals 9. The ileocecal valve,                            appendiceal orifice, and rectum were photographed. Scope In: 10:23:04 AM Scope Out: 11:23:30 AM Scope Withdrawal Time: 0 hours 49 minutes 46 seconds  Total Procedure Duration: 1 hour 0 minutes 26 seconds  Findings:      Many sessile polyps were found in the sigmoid colon, transverse colon       and ascending colon. The polyps were 3 to 25 mm in size. These polyps       were removed with a cold snare. Resection was complete, but the polyp       tissue was only partially retrieved.      Many polyps were again identified. Two large sessile polyps were found       in the ascending colon. Both were approximately 20-25 mm. They were       removed in a piecemeal fashion with a wide margin of resection. A total        of 12 hemoclips were successfully placed closing up the mucosal defect.       In the sigmoid colon the prior tattoo was noted, but there was no       evidence of any residual polyp.      To prevent bleeding post-intervention, twelve hemostatic clips were       successfully placed (MR conditional). There was no bleeding at the end       of the procedure. Impression:               - Many 3 to 25 mm polyps in the sigmoid colon, in                            the transverse colon and in the ascending colon,                            removed with a cold snare. Complete resection.                            Partial retrieval. Clips (MR conditional) were                            placed. Moderate Sedation:      Not Applicable - Patient had care per Anesthesia. Recommendation:           - Patient has a contact number available for                            emergencies.  The signs and symptoms of potential                            delayed complications were discussed with the                            patient. Return to normal activities tomorrow.                            Written discharge instructions were provided to the                            patient.                           - Resume previous diet.                           - Continue present medications.                           - Await pathology results.                           - Repeat colonoscopy in 6 months for surveillance.                           - Hold Eliquis for another 5 days. Procedure Code(s):        --- Professional ---                           [phone], Colonoscopy, flexible; with removal of                            tumor(s), polyp(s), or other lesion(s) by snare                            technique Diagnosis Code(s):        --- Professional ---                           K63.5, Polyp of colon                           Z86.010, Personal history of colonic polyps CPT copyright 2019 American Medical Association.  All rights reserved. The codes documented in this report are preliminary and upon coder review may  be revised to meet current compliance requirements. Carol Ada, MD Carol Ada, MD 01/20/2021 11:37:13 AM This report has been signed electronically. Number of Addenda: 0

## 2021-01-20 NOTE — Anesthesia Preprocedure Evaluation (Signed)
Anesthesia Evaluation  Patient identified by MRN, date of birth, ID band Patient awake    Reviewed: Allergy & Precautions, NPO status , Patient's Chart, lab work & pertinent test results  Airway Mallampati: II  TM Distance: >3 FB Neck ROM: Full    Dental   Pulmonary COPD, Current Smoker,    breath sounds clear to auscultation       Cardiovascular hypertension, Pt. on medications and Pt. on home beta blockers + CABG, + Peripheral Vascular Disease and +CHF   Rhythm:Regular Rate:Normal     Neuro/Psych negative neurological ROS     GI/Hepatic negative GI ROS, Neg liver ROS,   Endo/Other  negative endocrine ROS  Renal/GU negative Renal ROS     Musculoskeletal   Abdominal   Peds  Hematology negative hematology ROS (+)   Anesthesia Other Findings   Reproductive/Obstetrics                             Anesthesia Physical Anesthesia Plan  ASA: 3  Anesthesia Plan: MAC   Post-op Pain Management: Minimal or no pain anticipated   Induction:   PONV Risk Score and Plan: 0 and Propofol infusion and Treatment may vary due to age or medical condition  Airway Management Planned: Natural Airway and Simple Face Mask  Additional Equipment:   Intra-op Plan:   Post-operative Plan:   Informed Consent: I have reviewed the patients History and Physical, chart, labs and discussed the procedure including the risks, benefits and alternatives for the proposed anesthesia with the patient or authorized representative who has indicated his/her understanding and acceptance.       Plan Discussed with:   Anesthesia Plan Comments:         Anesthesia Quick Evaluation

## 2021-01-20 NOTE — Discharge Instructions (Signed)

## 2021-01-20 NOTE — Anesthesia Procedure Notes (Signed)
Procedure Name: MAC Date/Time: 01/20/2021 10:25 AM Performed by: Lieutenant Diego, CRNA Pre-anesthesia Checklist: Patient identified, Emergency Drugs available, Suction available, Patient being monitored and Timeout performed Patient Re-evaluated:Patient Re-evaluated prior to induction Oxygen Delivery Method: Simple face mask Preoxygenation: Pre-oxygenation with 100% oxygen Induction Type: IV induction

## 2021-01-20 NOTE — Transfer of Care (Signed)
Immediate Anesthesia Transfer of Care Note  Patient: Caleb [name]  Procedure(s) Performed: COLONOSCOPY WITH PROPOFOL POLYPECTOMY HEMOSTASIS CLIP PLACEMENT  Patient Location: PACU  Anesthesia Type:MAC  Level of Consciousness: sedated  Airway & Oxygen Therapy: Patient Spontanous Breathing and Patient connected to face mask oxygen  Post-op Assessment: Report given to RN and Post -op Vital signs reviewed and stable  Post vital signs: Reviewed and stable  Last Vitals:  Vitals Value Taken Time  BP 161/53 01/20/21 1130  Temp 36.3 C 01/20/21 1129  Pulse 56 01/20/21 1131  Resp 14 01/20/21 1131  SpO2 100 % 01/20/21 1131  Vitals shown include unvalidated device data.  Last Pain:  Vitals:   01/20/21 1129  TempSrc: Temporal  PainSc: Asleep         Complications: No notable events documented.

## 2021-01-20 NOTE — H&P (Signed)
Caleb [name] HPI: His colonoscopy on 09/18/2019 was positive for multiple adenomas.  One adenoma measured 40 mm in the sigmoid colon.  He was supposed to follow up in 6 months for a repeat colonoscopy.  The patient suffered a wound in his left foot.  This imobilized him and he had a long recovery.  This is the reason that he did not follow up for the repeat colonoscopy.  Stents were placed in his femoral arteries as the vascular insufficiency resulted in the wounds and opoor healing.  Currently the patient is on Xarelto and this is managed by [facility].  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   LIMA to LAD, RIMA to RCA, SVG to OM1, EVH via right thigh   Tobacco abuse     Past Surgical History:  Procedure Laterality Date   BIOPSY  10/31/2018   Procedure: BIOPSY;  Surgeon: Carol Ada, MD;  Location: [facility];  Service: Endoscopy;;   cataract surgery  Bilateral 12-2017 and 01-2018   COLONOSCOPY WITH PROPOFOL N/A 10/31/2018   Procedure: COLONOSCOPY WITH PROPOFOL;  Surgeon: Carol Ada, MD;  Location: [facility];  Service: Endoscopy;  Laterality: N/A;   COLONOSCOPY WITH PROPOFOL N/A 09/18/2019   Procedure: COLONOSCOPY WITH PROPOFOL;  Surgeon: Carol Ada, MD;  Location: [facility];  Service: Endoscopy;  Laterality: N/A;   CORONARY ARTERY BYPASS GRAFT N/A 02/17/2014   Procedure: CORONARY ARTERY BYPASS GRAFTING (CABG);  Surgeon: Rexene Alberts, MD;  Location: [place];  Service: Open Heart Surgery;  Laterality: N/A;  Times 3 using bilateral mammary arteries and endoscopically harvested right saphenous vein   ESOPHAGOGASTRODUODENOSCOPY (EGD) WITH PROPOFOL N/A 10/31/2018   Procedure: ESOPHAGOGASTRODUODENOSCOPY (EGD) WITH PROPOFOL;  Surgeon: Carol Ada,  MD;  Location: [facility];  Service: Endoscopy;  Laterality: N/A;   ESOPHAGOGASTRODUODENOSCOPY (EGD) WITH PROPOFOL N/A 09/18/2019   Procedure: ESOPHAGOGASTRODUODENOSCOPY (EGD) WITH PROPOFOL;  Surgeon: Carol Ada, MD;  Location: [facility];  Service: Endoscopy;  Laterality: N/A;   FEMORAL ARTERY - FEMORAL ARTERY BYPASS GRAFT      right femoral artery to below-knee popliteal artery bypass with PTFE and right first ray amputation 04/25/17   HEMOSTASIS CLIP PLACEMENT  10/31/2018   Procedure: HEMOSTASIS CLIP PLACEMENT;  Surgeon: Carol Ada, MD;  Location: [facility];  Service: Endoscopy;;   HEMOSTASIS CLIP PLACEMENT  09/18/2019   Procedure: HEMOSTASIS CLIP PLACEMENT;  Surgeon: Carol Ada, MD;  Location: [facility];  Service: Endoscopy;;   INTRAOPERATIVE TRANSESOPHAGEAL ECHOCARDIOGRAM N/A 02/17/2014   Procedure: INTRAOPERATIVE TRANSESOPHAGEAL ECHOCARDIOGRAM;  Surgeon: Rexene Alberts, MD;  Location: [place];  Service: Open Heart Surgery;  Laterality: N/A;   LEFT HEART CATHETERIZATION WITH CORONARY ANGIOGRAM N/A 02/12/2014   Procedure: LEFT HEART CATHETERIZATION WITH CORONARY ANGIOGRAM;  Surgeon: Sinclair Grooms, MD;  Location: [facility] CATH LAB;  Service: Cardiovascular;  Laterality: N/A;   POLYPECTOMY  10/31/2018   Procedure: POLYPECTOMY;  Surgeon: Carol Ada, MD;  Location: [facility];  Service: Endoscopy;;   POLYPECTOMY  09/18/2019   Procedure: POLYPECTOMY;  Surgeon: Carol Ada, MD;  Location: [facility];  Service: Endoscopy;;   SCLEROTHERAPY  09/18/2019   Procedure: Clide Deutscher;  Surgeon: Carol Ada, MD;  Location: [facility];  Service: Endoscopy;;   SUBMUCOSAL INJECTION  09/18/2019   Procedure: SUBMUCOSAL  INJECTION;  Surgeon: Carol Ada, MD;  Location: [facility];  Service: Endoscopy;;   SUBMUCOSAL LIFTING INJECTION  10/31/2018   Procedure: SUBMUCOSAL LIFTING INJECTION;  Surgeon: Carol Ada, MD;  Location: [facility];  Service: Endoscopy;;   TOE AMPUTATION Right     right great toe     Family History  Problem Relation Age of Onset   Heart attack Mother    Diabetes Mother    Heart attack Brother    Diabetes Brother    Cancer Sister    Stroke Neg Hx     Social History:  reports that he has been smoking cigarettes. He has a 17.50 pack-year smoking history. He has never used smokeless tobacco. He reports current alcohol use. He reports that he does not use drugs.  Allergies:  Allergies  Allergen Reactions   Sulfamethoxazole-Trimethoprim Other (See Comments)    "Made potassium go high and sodium go low"   Cilostazol Swelling and Other (See Comments)    Site of swelling not recalled by the patient   Lisinopril Cough and Other (See Comments)   Losartan Swelling    Pt's daughter stated it made pt's blood pressure and sodium too low, but pt has been tolerating irbesartan.    Medications: Scheduled: Continuous:  sodium chloride     lactated ringers 10 mL/hr at 01/20/21 1007    No results found for this or any previous visit (from the past 24 hour(s)).   No results found.  ROS:  As stated above in the HPI otherwise negative.  Blood pressure (!) 196/75, pulse 74, temperature 98 F (36.7 C), temperature source Temporal, resp. rate 18, height 5\' 4"  (1.626 m), weight 65.3 kg, SpO2 100 %.    PE: Gen: NAD, Alert and Oriented HEENT:  [place]/AT, EOMI Neck: Supple, no LAD Lungs: CTA Bilaterally CV: RRR without M/G/R ABD: Soft, NTND, +BS Ext: No C/C/E  Assessment/Plan: 1) Personal history of polyps - colonoscopy.  Alayha Babineaux D 01/20/2021, 10:17 AM

## 2021-01-20 NOTE — Anesthesia Postprocedure Evaluation (Signed)
Anesthesia Post Note  Patient: Caleb [name]  Procedure(s) Performed: COLONOSCOPY WITH PROPOFOL POLYPECTOMY HEMOSTASIS CLIP PLACEMENT     Patient location during evaluation: PACU Anesthesia Type: MAC Level of consciousness: awake and alert Pain management: pain level controlled Vital Signs Assessment: post-procedure vital signs reviewed and stable Respiratory status: spontaneous breathing, nonlabored ventilation, respiratory function stable and patient connected to nasal cannula oxygen Cardiovascular status: stable and blood pressure returned to baseline Postop Assessment: no apparent nausea or vomiting Anesthetic complications: no   No notable events documented.  Last Vitals:  Vitals:   01/20/21 1140 01/20/21 1150  BP: (!) 203/68 (!) 205/68  Pulse: (!) 59 62  Resp: (!) 23 19  Temp:    SpO2: 100% 99%    Last Pain:  Vitals:   01/20/21 1150  TempSrc:   PainSc: 0-No pain                 Tiajuana Amass

## 2021-01-23 LAB — SURGICAL PATHOLOGY

## 2021-01-23 NOTE — Progress Notes (Signed)
Date:  02/02/2021   ID:  Caleb [name], DOB [birth year], MRN [id number]  PCP:  Percell Belt, DO  Cardiologist:  Jenkins Rouge, MD  Electrophysiologist:  None   Evaluation Performed:  Follow-Up Visit  Chief Complaint:  none  History of Present Illness:    71 y.o. with history of CAD/CABG in 2016. Last myovue done 12/28/19 was normal and nonischemic with EF 60%. 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   LIMA to LAD, RIMA to RCA, SVG to OM1, EVH via right thigh   Tobacco abuse    Past Surgical History:  Procedure Laterality Date   BIOPSY  10/31/2018   Procedure: BIOPSY;  Surgeon: Carol Ada, MD;  Location: [facility];  Service: Endoscopy;;   cataract surgery  Bilateral 12-2017 and 01-2018   COLONOSCOPY WITH PROPOFOL N/A 10/31/2018   Procedure: COLONOSCOPY WITH PROPOFOL;  Surgeon: Carol Ada, MD;  Location: [facility];  Service: Endoscopy;  Laterality: N/A;   COLONOSCOPY WITH PROPOFOL N/A 09/18/2019   Procedure: COLONOSCOPY WITH PROPOFOL;  Surgeon: Carol Ada, MD;  Location: [facility];  Service: Endoscopy;  Laterality: N/A;   COLONOSCOPY WITH PROPOFOL N/A 01/20/2021   Procedure: COLONOSCOPY WITH PROPOFOL;  Surgeon: Carol Ada, MD;  Location: [facility];  Service: Endoscopy;  Laterality: N/A;   CORONARY ARTERY BYPASS GRAFT N/A 02/17/2014   Procedure: CORONARY ARTERY BYPASS GRAFTING (CABG);  Surgeon: Rexene Alberts, MD;  Location: [facility];  Service: Open Heart Surgery;  Laterality: N/A;  Times 3 using bilateral mammary arteries and endoscopically harvested right saphenous vein   ESOPHAGOGASTRODUODENOSCOPY (EGD) WITH PROPOFOL N/A 10/31/2018   Procedure: ESOPHAGOGASTRODUODENOSCOPY (EGD) WITH PROPOFOL;  Surgeon: Carol Ada, MD;  Location: [facility];  Service: Endoscopy;  Laterality: N/A;   ESOPHAGOGASTRODUODENOSCOPY (EGD) WITH PROPOFOL N/A 09/18/2019   Procedure: ESOPHAGOGASTRODUODENOSCOPY (EGD) WITH PROPOFOL;  Surgeon: Carol Ada, MD;  Location: [facility];  Service: Endoscopy;  Laterality: N/A;   FEMORAL ARTERY - FEMORAL ARTERY BYPASS GRAFT      right femoral artery to below-knee popliteal artery bypass with PTFE and right first ray amputation 04/25/17   HEMOSTASIS CLIP PLACEMENT  10/31/2018   Procedure: HEMOSTASIS CLIP PLACEMENT;  Surgeon: Carol Ada, MD;  Location: [facility];  Service: Endoscopy;;   HEMOSTASIS CLIP PLACEMENT  09/18/2019   Procedure: HEMOSTASIS CLIP PLACEMENT;  Surgeon: Carol Ada, MD;  Location: [facility];  Service: Endoscopy;;   HEMOSTASIS CLIP PLACEMENT  01/20/2021   Procedure: HEMOSTASIS CLIP PLACEMENT;  Surgeon: Carol Ada, MD;  Location: [facility];  Service: Endoscopy;;   INTRAOPERATIVE TRANSESOPHAGEAL ECHOCARDIOGRAM N/A 02/17/2014   Procedure: INTRAOPERATIVE TRANSESOPHAGEAL ECHOCARDIOGRAM;  Surgeon: Rexene Alberts, MD;  Location: [facility];  Service: Open  Heart Surgery;  Laterality: N/A;   LEFT HEART CATHETERIZATION WITH CORONARY ANGIOGRAM N/A 02/12/2014   Procedure: LEFT HEART CATHETERIZATION WITH CORONARY ANGIOGRAM;  Surgeon: Sinclair Grooms, MD;  Location: [facility];  Service: Cardiovascular;  Laterality: N/A;   POLYPECTOMY  10/31/2018   Procedure: POLYPECTOMY;  Surgeon: Carol Ada, MD;  Location: [facility];  Service: Endoscopy;;   POLYPECTOMY  09/18/2019   Procedure: POLYPECTOMY;  Surgeon: Carol Ada, MD;  Location: [facility];  Service: Endoscopy;;   POLYPECTOMY  01/20/2021   Procedure: POLYPECTOMY;  Surgeon: Carol Ada, MD;  Location: [facility];  Service: Endoscopy;;   SCLEROTHERAPY  09/18/2019   Procedure: Clide Deutscher;  Surgeon: Carol Ada, MD;  Location: [facility];  Service: Endoscopy;;   SUBMUCOSAL INJECTION  09/18/2019   Procedure: SUBMUCOSAL INJECTION;  Surgeon: Carol Ada, MD;  Location: [facility];  Service: Endoscopy;;   SUBMUCOSAL LIFTING INJECTION  10/31/2018   Procedure: SUBMUCOSAL LIFTING INJECTION;  Surgeon: Carol Ada, MD;  Location: [facility];  Service: Endoscopy;;   TOE AMPUTATION Right    right great toe      Current Meds  Medication Sig   acetaminophen (TYLENOL) 500 MG tablet Take 1,000 mg by mouth 2 (two) times daily as needed for mild pain or moderate pain.    albuterol (VENTOLIN HFA) 108 (90 Base) MCG/ACT inhaler Inhale 2 puffs into the lungs every 6 (six) hours as needed for wheezing or shortness of breath.   amLODipine (NORVASC) 5 MG tablet Take 5 mg by mouth daily.   aspirin EC 81 MG tablet Take 81 mg by mouth daily. Swallow whole.   atorvastatin (LIPITOR) 80 MG tablet Take 1 tablet (80 mg total) by mouth daily.   carvedilol (COREG) 25 MG tablet Take 25 mg by mouth 2 (two) times daily with a meal.   Cholecalciferol (VITAMIN D-3 PO) Take 1,000 Units by mouth daily with breakfast.   denosumab (PROLIA) 60 MG/ML SOSY injection Inject 1 mL into the skin every 6 (six) months.    ferrous sulfate 325 (65 FE) MG tablet Take 325 mg by mouth daily with breakfast.   folic acid (FOLVITE) 1 MG tablet Take 1 mg by mouth daily.   irbesartan (AVAPRO) 300 MG tablet Take 300 mg by mouth daily.   nitroGLYCERIN (NITROSTAT) 0.4 MG SL tablet Place 0.4 mg under the tongue every 5 (five) minutes as needed for chest pain.   Olopatadine HCl 0.2 % SOLN Place 1 drop into both eyes daily as needed (allergies).   pantoprazole (PROTONIX) 40 MG tablet Take 40 mg by mouth daily.   pregabalin (LYRICA) 75 MG capsule Take 75 mg by mouth 3 (three) times daily.   rivaroxaban (XARELTO) 2.5 MG TABS tablet Take 2.5 mg by mouth 2 (two) times daily.   thiamine 100 MG tablet Take 100 mg by mouth daily.   vitamin B-12 (CYANOCOBALAMIN) 1000 MCG tablet Take 1,000 mcg by mouth daily with breakfast.   [DISCONTINUED] gabapentin (NEURONTIN) 300 MG capsule Take 300 mg by mouth 3 (three) times daily.     Allergies:   Sulfamethoxazole-trimethoprim, Cilostazol, Lisinopril, and Losartan   Social History   Tobacco Use   Smoking status: Every  Day    Packs/day: 0.50    Years: 35.00    Pack years: 17.50    Types: Cigarettes    Last attempt to quit: 02/11/2014    Years since quitting: 6.9   Smokeless tobacco: Never   Tobacco comments:    has quit twice before   Vaping Use   Vaping Use: Never used  Substance Use Topics   Alcohol use: Yes    Alcohol/week: 0.0 standard drinks    Comment: Six pack a day of beer   Drug use: Never     Family Hx: The patient's family history includes Cancer in his sister; Diabetes in his brother and mother; Heart attack in his brother and mother. There is no history of Stroke.  ROS:   Please see the history of present illness.    All other systems reviewed and are negative.   Prior CV studies:   The following studies were reviewed today: Echo July 2020  Labs/Other Tests and Data Reviewed:    EKG:  SR rate 68 PVC old IMI 08/25/18   Recent Labs: 09/05/2020: TSH  1.366 09/07/2020: Magnesium 1.9 01/30/2021: ALT 6; BUN 10; Creatinine 0.89; Hemoglobin 13.8; Platelet Count 188; Potassium 4.6; Sodium 131   Recent Lipid Panel Lab Results  Component Value Date/Time   CHOL 176 12/27/2015 02:48 PM   TRIG 247 (H) 12/27/2015 02:48 PM   HDL 52 12/27/2015 02:48 PM   CHOLHDL 3.4 12/27/2015 02:48 PM   LDLCALC 75 12/27/2015 02:48 PM    Wt Readings from Last 3 Encounters:  02/02/21 146 lb (66.2 kg)  01/30/21 146 lb (66.2 kg)  01/20/21 144 lb (65.3 kg)     Objective:    Vital Signs:  BP (!) 132/50    Pulse 66    Ht 5\' 4"  (1.626 m)    Wt 146 lb (66.2 kg)    SpO2 98%    BMI 25.06 kg/m    Affect appropriate Chronically ill male  HEENT: normal Neck supple with no adenopathy JVP normal left bruits no thyromegaly Lungs clear with no wheezing and good diaphragmatic motion Heart:  S1/S2 no murmur, no rub, gallop or click PMI normal post sternotomy  Abdomen: benighn, BS positve, no tenderness, no AAA no bruit.  No HSM or HJR LLE in boot ulcer on heel almost gone with black eschar   ASSESSMENT & PLAN:    CABG x 3 2016- No recent angina non ischemic myovue 12/28/19 continue medical Rx  PVD- S/p RFP bypass  And stents followed by Dr Trudi Ida Left RAS followed by Dr Gwenlyn Found despite RAR 6 kidney size normal and BP controlled and no angiography planned   Chronic anticoagulation- On Xarelto for PVD no bleeding issues   HTN- Well controlled.  Continue current medications and low sodium Dash type diet.      HLD- On high dose statin Rx followed by PCP  Smoking:   Counseled on cessation <10 minutes lung cancer screening CT negative 12/28/19  GI:  Post polypectomy 09/18/19 f/u Dr Benson [place] Repeat colon done 01/20/21 more polyps biopsied   Hyponatremia: low solute driven improved discussed beer intake f/u primary Diuretic d/c   Bruit:  Left sided [place] 12/23/19 plaque no stenosis   Neuropathy:  From cardiac perspective could have surgery However I suspect this  would Be put off until he is out of his boot and gone to rehab to gain strength. He has f/u with neuro Surgery    COVID-19 Education: The signs and symptoms  of COVID-19 were discussed with the patient and how to seek care for testing (follow up with PCP or arrange E-visit).  The importance of social distancing was discussed today.   Medication Adjustments/Labs and Tests Ordered: Current medicines are reviewed at length with the patient today.  Concerns regarding medicines are outlined above.   Tests Ordered:  None  Medication Changes: No orders of the defined types were placed in this encounter.   Follow Up:   6 months  Signed, Jenkins Rouge, MD  02/02/2021 9:51 AM    [place]

## 2021-01-30 ENCOUNTER — Other Ambulatory Visit: Payer: Self-pay

## 2021-01-30 ENCOUNTER — Inpatient Hospital Stay: Payer: Medicare HMO | Attending: Hematology and Oncology | Admitting: Hematology and Oncology

## 2021-01-30 ENCOUNTER — Inpatient Hospital Stay: Payer: Medicare HMO

## 2021-01-30 ENCOUNTER — Other Ambulatory Visit: Payer: Self-pay | Admitting: Hematology and Oncology

## 2021-01-30 VITALS — BP 133/61 | HR 68 | Temp 99.3°F | Resp 16 | Wt 146.0 lb

## 2021-01-30 DIAGNOSIS — I110 Hypertensive heart disease with heart failure: Secondary | ICD-10-CM | POA: Insufficient documentation

## 2021-01-30 DIAGNOSIS — F17210 Nicotine dependence, cigarettes, uncomplicated: Secondary | ICD-10-CM | POA: Insufficient documentation

## 2021-01-30 DIAGNOSIS — D472 Monoclonal gammopathy: Secondary | ICD-10-CM | POA: Diagnosis present

## 2021-01-30 LAB — CBC WITH DIFFERENTIAL (CANCER CENTER ONLY)
Abs Immature Granulocytes: 0.02 10*3/uL (ref 0.00–0.07)
Basophils Absolute: 0.1 10*3/uL (ref 0.0–0.1)
Basophils Relative: 1 %
Eosinophils Absolute: 0.1 10*3/uL (ref 0.0–0.5)
Eosinophils Relative: 2 %
HCT: 39.7 % (ref 39.0–52.0)
Hemoglobin: 13.8 g/dL (ref 13.0–17.0)
Immature Granulocytes: 0 %
Lymphocytes Relative: 14 %
Lymphs Abs: 1 10*3/uL (ref 0.7–4.0)
MCH: 33.3 pg (ref 26.0–34.0)
MCHC: 34.8 g/dL (ref 30.0–36.0)
MCV: 95.9 fL (ref 80.0–100.0)
Monocytes Absolute: 0.6 10*3/uL (ref 0.1–1.0)
Monocytes Relative: 9 %
Neutro Abs: 5 10*3/uL (ref 1.7–7.7)
Neutrophils Relative %: 74 %
Platelet Count: 188 10*3/uL (ref 150–400)
RBC: 4.14 MIL/uL — ABNORMAL LOW (ref 4.22–5.81)
RDW: 13.2 % (ref 11.5–15.5)
WBC Count: 6.7 10*3/uL (ref 4.0–10.5)
nRBC: 0 % (ref 0.0–0.2)

## 2021-01-30 LAB — CMP (CANCER CENTER ONLY)
ALT: 6 U/L (ref 0–44)
AST: 11 U/L — ABNORMAL LOW (ref 15–41)
Albumin: 3.8 g/dL (ref 3.5–5.0)
Alkaline Phosphatase: 73 U/L (ref 38–126)
Anion gap: 7 (ref 5–15)
BUN: 10 mg/dL (ref 8–23)
CO2: 27 mmol/L (ref 22–32)
Calcium: 9.4 mg/dL (ref 8.9–10.3)
Chloride: 97 mmol/L — ABNORMAL LOW (ref 98–111)
Creatinine: 0.89 mg/dL (ref 0.61–1.24)
GFR, Estimated: >60 mL/min (ref >60)
Glucose, Bld: 98 mg/dL (ref 70–99)
Potassium: 4.6 mmol/L (ref 3.5–5.1)
Sodium: 131 mmol/L — ABNORMAL LOW (ref 135–145)
Total Bilirubin: 0.7 mg/dL (ref 0.3–1.2)
Total Protein: 6.9 g/dL (ref 6.5–8.1)

## 2021-01-30 LAB — LACTATE DEHYDROGENASE: LDH: 128 U/L (ref 98–192)

## 2021-01-30 NOTE — Progress Notes (Signed)
[place] [phone]   [phone]  PROGRESS NOTE  Patient Care Team: Percell Belt, DO as PCP - General (Family Medicine) Josue Hector, MD as PCP - Cardiology (Cardiology)  Hematological/Oncological History #IgG Kappa Monoclonal Gammopathy of Undetermined Significance 1) 03/07/2020: Multiple Myeloma Panel revealed M potein 0.5 with IFE showing IgG monoclonal protein with kappa light chain specificity. 2) 04/12/2020: Establish care with Caleb Query PA-C  Interval History:  Caleb [name] 71 y.o. male with medical history significant for IgG Kappa MGUS who presents for a follow up visit. The patient's last visit was on 08/01/2020. In the interim since the last visit he has had no major changes in his health.   Caleb [name] is accompanied by his daughter.  He reports he has been doing well overall in the interim since her last visit.  He notes that his energy is so-so and rates it about a 7 on a 10.  He notes he is not having any new bone or back pain.  He is not having any change in his urinary habits.  He recently transition from gabapentin to Lyrica.  He denies any bleeding or bruising.  He denies any nausea, vomiting, or diarrhea.  He otherwise reports that he feels well with no new symptoms.  A full 10 point ROS is listed below.  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   LIMA to LAD, RIMA to RCA, SVG to OM1, EVH via right thigh   Tobacco abuse     SURGICAL HISTORY: Past Surgical History:  Procedure Laterality Date   BIOPSY  10/31/2018   Procedure: BIOPSY;  Surgeon: Carol Ada, MD;  Location: [facility];  Service: Endoscopy;;   cataract surgery  Bilateral 12-2017 and 01-2018    COLONOSCOPY WITH PROPOFOL N/A 10/31/2018   Procedure: COLONOSCOPY WITH PROPOFOL;  Surgeon: Carol Ada, MD;  Location: [facility];  Service: Endoscopy;  Laterality: N/A;   COLONOSCOPY WITH PROPOFOL N/A 09/18/2019   Procedure: COLONOSCOPY WITH PROPOFOL;  Surgeon: Carol Ada, MD;  Location: [facility];  Service: Endoscopy;  Laterality: N/A;   COLONOSCOPY WITH PROPOFOL N/A 01/20/2021   Procedure: COLONOSCOPY WITH PROPOFOL;  Surgeon: Carol Ada, MD;  Location: [facility];  Service: Endoscopy;  Laterality: N/A;   CORONARY ARTERY BYPASS GRAFT N/A 02/17/2014   Procedure: CORONARY ARTERY BYPASS GRAFTING (CABG);  Surgeon: Rexene Alberts, MD;  Location: [facility];  Service: Open Heart Surgery;  Laterality: N/A;  Times 3 using bilateral mammary arteries and endoscopically harvested right saphenous vein   ESOPHAGOGASTRODUODENOSCOPY (EGD) WITH PROPOFOL N/A 10/31/2018   Procedure: ESOPHAGOGASTRODUODENOSCOPY (EGD) WITH PROPOFOL;  Surgeon: Carol Ada, MD;  [facility]: [facility];  Service: Endoscopy;  Laterality: N/A;   ESOPHAGOGASTRODUODENOSCOPY (EGD) WITH PROPOFOL N/A 09/18/2019   Procedure: ESOPHAGOGASTRODUODENOSCOPY (EGD) WITH PROPOFOL;  Surgeon: Carol Ada, MD;  Location: [facility];  Service: Endoscopy;  Laterality: N/A;   FEMORAL ARTERY - FEMORAL ARTERY BYPASS GRAFT      right femoral artery to below-knee popliteal artery bypass with PTFE and right first ray amputation 04/25/17   HEMOSTASIS CLIP PLACEMENT  10/31/2018   Procedure: HEMOSTASIS CLIP PLACEMENT;  Surgeon: Carol Ada, MD;  [facility]: [facility];  Service: Endoscopy;;   HEMOSTASIS CLIP PLACEMENT  09/18/2019   Procedure: HEMOSTASIS CLIP PLACEMENT;  Surgeon: Carol Ada, MD;  Location: [facility];  Service: Endoscopy;;   HEMOSTASIS CLIP PLACEMENT  01/20/2021   Procedure: HEMOSTASIS CLIP PLACEMENT;  Surgeon: Carol Ada, MD;  Location: [facility];  Service: Endoscopy;;   INTRAOPERATIVE TRANSESOPHAGEAL ECHOCARDIOGRAM N/A 02/17/2014    Procedure: INTRAOPERATIVE TRANSESOPHAGEAL ECHOCARDIOGRAM;  Surgeon: Rexene Alberts, MD;  Location: [place];  Service: Open Heart Surgery;  Laterality: N/A;   LEFT HEART CATHETERIZATION WITH CORONARY ANGIOGRAM N/A 02/12/2014   Procedure: LEFT HEART CATHETERIZATION WITH CORONARY ANGIOGRAM;  Surgeon: Sinclair Grooms, MD;  Location: [facility];  Service: Cardiovascular;  Laterality: N/A;   POLYPECTOMY  10/31/2018   Procedure: POLYPECTOMY;  Surgeon: Carol Ada, MD;  [facility]: [facility];  Service: Endoscopy;;   POLYPECTOMY  09/18/2019   Procedure: POLYPECTOMY;  Surgeon: Carol Ada, MD;  Location: [facility];  Service: Endoscopy;;   POLYPECTOMY  01/20/2021   Procedure: POLYPECTOMY;  Surgeon: Carol Ada, MD;  Location: [facility];  Service: Endoscopy;;   SCLEROTHERAPY  09/18/2019   Procedure: Clide Deutscher;  Surgeon: Carol Ada, MD;  Location: [facility];  Service: Endoscopy;;   SUBMUCOSAL INJECTION  09/18/2019   Procedure: SUBMUCOSAL INJECTION;  Surgeon: Carol Ada, MD;  [facility]: [facility];  Service: Endoscopy;;   SUBMUCOSAL LIFTING INJECTION  10/31/2018   Procedure: SUBMUCOSAL LIFTING INJECTION;  Surgeon: Carol Ada, MD;  Location: [facility];  Service: Endoscopy;;   TOE AMPUTATION Right    right great toe     SOCIAL HISTORY: Social History   Socioeconomic History   Marital status: Single    Spouse name: Not on file   Number of children: Not on file   Years of education: Not on file   Highest education level: Not on file  Occupational History   Occupation: metal work  Tobacco Use   Smoking status: Every Day    Packs/day: 0.50    Years: 35.00    Pack years: 17.50    Types: Cigarettes    Last attempt to quit: 02/11/2014    Years since quitting: 6.9   Smokeless tobacco: Never   Tobacco comments:    has quit twice before   Vaping Use   Vaping Use: Never used  Substance and Sexual Activity   Alcohol use: Yes    Alcohol/week: 0.0 standard drinks     Comment: Six pack a day of beer   Drug use: Never   Sexual activity: Not on file  Other Topics Concern   Not on file  Social History Narrative   Not on file   Social Determinants of Health   Financial Resource Strain: Not on file  Food Insecurity: Not on file  Transportation Needs: Not on file  Physical Activity: Not on file  Stress: Not on file  Social Connections: Not on file  Intimate Partner Violence: Not on file    FAMILY HISTORY: Family History  Problem Relation Age of Onset   Heart attack Mother    Diabetes Mother    Heart attack Brother    Diabetes Brother    Cancer Sister    Stroke Neg Hx     ALLERGIES:  is allergic to sulfamethoxazole-trimethoprim, cilostazol, lisinopril, and losartan.  MEDICATIONS:  Current Outpatient Medications  Medication Sig Dispense Refill   acetaminophen (TYLENOL) 500 MG tablet Take 1,000 mg by mouth 2 (two) times daily as needed for mild pain or moderate pain.      albuterol (VENTOLIN HFA) 108 (90 Base) MCG/ACT inhaler Inhale  2 puffs into the lungs every 6 (six) hours as needed for wheezing or shortness of breath. 18 g 3   amLODipine (NORVASC) 5 MG tablet Take 0.5 tablets (2.5 mg total) by mouth daily. (Patient taking differently: Take 5 mg by mouth daily.)     aspirin EC 81 MG tablet Take 81 mg by mouth daily. Swallow whole.     atorvastatin (LIPITOR) 80 MG tablet Take 1 tablet (80 mg total) by mouth daily. 90 tablet 3   carvedilol (COREG) 25 MG tablet Take 25 mg by mouth 2 (two) times daily with a meal.     Cholecalciferol (VITAMIN D-3 PO) Take 1,000 Units by mouth daily with breakfast.     denosumab (PROLIA) 60 MG/ML SOSY injection Inject 1 mL into the skin every 6 (six) months.     ferrous sulfate 325 (65 FE) MG tablet Take 325 mg by mouth daily with breakfast.     folic acid (FOLVITE) 1 MG tablet Take 1 mg by mouth daily.     gabapentin (NEURONTIN) 300 MG capsule Take 300 mg by mouth 3 (three) times daily.     irbesartan (AVAPRO)  300 MG tablet Take 300 mg by mouth daily.     nitroGLYCERIN (NITROSTAT) 0.4 MG SL tablet Place 0.4 mg under the tongue every 5 (five) minutes as needed for chest pain.     Olopatadine HCl 0.2 % SOLN Place 1 drop into both eyes daily as needed (allergies).     pantoprazole (PROTONIX) 40 MG tablet Take 40 mg by mouth daily.     rivaroxaban (XARELTO) 2.5 MG TABS tablet Take 2.5 mg by mouth 2 (two) times daily.     thiamine 100 MG tablet Take 100 mg by mouth daily.     vitamin B-12 (CYANOCOBALAMIN) 1000 MCG tablet Take 1,000 mcg by mouth daily with breakfast.     No current facility-administered medications for this visit.    REVIEW OF SYSTEMS:   Constitutional: ( - ) fevers, ( - )  chills , ( - ) night sweats Eyes: ( - ) blurriness of vision, ( - ) double vision, ( - ) watery eyes Ears, nose, mouth, throat, and face: ( - ) mucositis, ( - ) sore throat Respiratory: ( - ) cough, ( - ) dyspnea, ( - ) wheezes Cardiovascular: ( - ) palpitation, ( - ) chest discomfort, ( - ) lower extremity swelling Gastrointestinal:  ( - ) nausea, ( - ) heartburn, ( - ) change in bowel habits Skin: ( - ) abnormal skin rashes Lymphatics: ( - ) new lymphadenopathy, ( - ) easy bruising Neurological: ( - ) numbness, ( - ) tingling, ( - ) new weaknesses Behavioral/Psych: ( - ) mood change, ( - ) new changes  All other systems were reviewed with the patient and are negative.  PHYSICAL EXAMINATION: ECOG PERFORMANCE STATUS: 0 - Asymptomatic  Vitals:   01/30/21 1443  BP: 133/61  Pulse: 68  Resp: 16  Temp: 99.3 F (37.4 C)  SpO2: 99%    Filed Weights   01/30/21 1443  Weight: 146 lb (66.2 kg)     GENERAL: Well-appearing elderly male, alert, no distress and comfortable SKIN: skin color, texture, turgor are normal, no rashes or significant lesions EYES: conjunctiva are pink and non-injected, sclera clear LUNGS: clear to auscultation and percussion with normal breathing effort HEART: regular rate & rhythm  and no murmurs and no lower extremity edema PSYCH: alert & oriented x 3, fluent speech NEURO: no  focal motor/sensory deficits  LABORATORY DATA:  I have reviewed the data as listed CBC Latest Ref Rng & Units 01/30/2021 09/06/2020 09/04/2020  WBC 4.0 - 10.5 K/uL 6.7 9.1 9.3  Hemoglobin 13.0 - 17.0 g/dL 13.8 13.1 13.8  Hematocrit 39.0 - 52.0 % 39.7 35.1(L) 36.8(L)  Platelets 150 - 400 K/uL 188 163 155    CMP Latest Ref Rng & Units 01/30/2021 09/09/2020 09/08/2020  Glucose 70 - 99 mg/dL 98 128(H) 110(H)  BUN 8 - 23 mg/dL 10 16 13   Creatinine 0.61 - 1.24 mg/dL 0.89 0.91 0.80  Sodium 135 - 145 mmol/L 131(L) 130(L) 127(L)  Potassium 3.5 - 5.1 mmol/L 4.6 4.0 4.0  Chloride 98 - 111 mmol/L 97(L) 95(L) 95(L)  CO2 22 - 32 mmol/L 27 25 25   Calcium 8.9 - 10.3 mg/dL 9.4 9.7 9.5  Total Protein 6.5 - 8.1 g/dL 6.9 - -  Total Bilirubin 0.3 - 1.2 mg/dL 0.7 - -  Alkaline Phos 38 - 126 U/L 73 - -  AST 15 - 41 U/L 11(L) - -  ALT 0 - 44 U/L 6 - -    Lab Results  Component Value Date   MPROTEIN 0.5 (H) 08/01/2020   MPROTEIN 0.5 (H) 03/07/2020   Lab Results  Component Value Date   KPAFRELGTCHN 30.6 (H) 08/01/2020   KPAFRELGTCHN 30.4 (H) 04/13/2020   LAMBDASER 23.4 08/01/2020   LAMBDASER 22.8 04/13/2020   KAPLAMBRATIO 1.31 08/01/2020   KAPLAMBRATIO 4.90 04/20/2020   KAPLAMBRATIO 1.33 04/13/2020    RADIOGRAPHIC STUDIES: VAS US CAROTID  Result Date: 01/02/2021 Carotid Arterial Duplex Study Patient Name:  SHER HELLINGER  Date of Exam:   01/02/2021 Medical Rec #: [id number]    Accession #:    [id number] Date of Birth: 1950-06-17    Patient Gender: M Patient Age:   16 years Exam Location:  [facility] Procedure:      VAS US CAROTID Referring Phys: Jenkins Rouge --------------------------------------------------------------------------------  Indications:       Carotid artery disease follow-up. Mild to moderate                    atherosclerosis, no cerebrovascular symptoms. Risk Factors:      Hypertension,  hyperlipidemia, current smoker, PAD. Other Factors:     CABG X 3, COPD. Comparison Study:  Previous carotid duplex performed 12/23/19 showed RICA                    velocities of 376/28 cm/sec and LICA velocities of 315/17                    cm/sec. Performing Technologist: Mariane Masters RVT  Examination Guidelines: A complete evaluation includes B-mode imaging, spectral Doppler, color Doppler, and power Doppler as needed of all accessible portions of each vessel. Bilateral testing is considered an integral part of a complete examination. Limited examinations for reoccurring indications may be performed as noted.  Right Carotid Findings: +----------+--------+--------+--------+-------------------------+---------+             PSV cm/s EDV cm/s Stenosis Plaque Description        Comments   +----------+--------+--------+--------+-------------------------+---------+  CCA Prox   83       10                                                     +----------+--------+--------+--------+-------------------------+---------+  CCA Distal 58       9                                                      +----------+--------+--------+--------+-------------------------+---------+  ICA Prox   91       17       1-39%    heterogenous and calcific Shadowing  +----------+--------+--------+--------+-------------------------+---------+  ICA Mid    76       15                                                     +----------+--------+--------+--------+-------------------------+---------+  ICA Distal 74       17                                                     +----------+--------+--------+--------+-------------------------+---------+  ECA        89       9                                                      +----------+--------+--------+--------+-------------------------+---------+ +----------+--------+-------+---------+-------------------+             PSV cm/s EDV cms Describe  Arm Pressure (mmHG)   +----------+--------+-------+---------+-------------------+  Subclavian 234              Turbulent 130                  +----------+--------+-------+---------+-------------------+ +---------+--------+--+--------+--+---------+  Vertebral PSV cm/s 68 EDV cm/s 14 Antegrade  +---------+--------+--+--------+--+---------+  Left Carotid Findings: +----------+--------+--------+--------+------------------+--------+             PSV cm/s EDV cm/s Stenosis Plaque Description Comments  +----------+--------+--------+--------+------------------+--------+  CCA Prox   93       10                                             +----------+--------+--------+--------+------------------+--------+  CCA Distal 72       10       <50%     heterogenous                 +----------+--------+--------+--------+------------------+--------+  ICA Prox   106      17                heterogenous                 +----------+--------+--------+--------+------------------+--------+  ICA Mid    170      30       1-39%                                 +----------+--------+--------+--------+------------------+--------+  ICA Distal 69  14                                             +----------+--------+--------+--------+------------------+--------+  ECA        145      7                                              +----------+--------+--------+--------+------------------+--------+ +----------+--------+--------+---------+-------------------+             PSV cm/s EDV cm/s Describe  Arm Pressure (mmHG)  +----------+--------+--------+---------+-------------------+  Subclavian 228               Turbulent 130                  +----------+--------+--------+---------+-------------------+ +---------+--------+-+--------+-+------+  Vertebral PSV cm/s 0 EDV cm/s 0 Absent  +---------+--------+-+--------+-+------+   Summary: Right Carotid: Velocities in the right ICA are consistent with a 1-39% stenosis. Left Carotid: Velocities in the left ICA are consistent with a  1-39% stenosis.               Non-hemodynamically significant plaque <50% noted in the CCA. Vertebrals:  Right vertebral artery demonstrates antegrade flow. Left vertebral              artery demonstrates no discernable flow. Subclavians: Bilateral subclavian artery flow was disturbed. *See table(s) above for measurements and observations. Suggest follow up study in 12 months if clinically indicated due to plaque formation. Electronically signed by Ida Rogue MD on 01/02/2021 at 5:42:24 PM.    Final     ASSESSMENT & PLAN Caleb [name] 71 y.o. male with medical history significant for IgG Kappa MGUS who presents for a follow up visit.   #IgG Kappa Monoclonal Gammopathy of Undetermined Significance --SPEP with IFE from 08/01/20 showed M-spike of 0.5, IgG monoclonal protein with kappa light chain specificity. K/L ratio is within normal limits with no CRAB criteria.  --today will repeat CBC w/ diff, CMP, LDH, kappa/lambda light chains --Mild increase in protein in the urine.  We will repeat UPEP on a yearly basis, last performed in April 2022.  --DG bone met survey showed no lytic lesions.  Repeat this on a yearly basis. Last performed in April 2022 -- No indication for a bone marrow biopsy based on above results. --RTC in 6 months with labs unless further workup is needed.   No orders of the defined types were placed in this encounter.    All questions were answered. The patient knows to call the clinic with any problems, questions or concerns.  A total of more than 30 minutes were spent on this encounter with face-to-face time and non-face-to-face time, including preparing to see the patient, ordering tests and/or medications, counseling the patient and coordination of care as outlined above.   Ledell Peoples, MD Department of Hematology/Oncology [facility] Phone: [phone] Pager: [phone] Email: [email or web address]  01/30/2021 3:58  PM

## 2021-01-31 LAB — KAPPA/LAMBDA LIGHT CHAINS
Kappa free light chain: 38.3 mg/L — ABNORMAL HIGH (ref 3.3–19.4)
Kappa, lambda light chain ratio: 1.51 (ref 0.26–1.65)
Lambda free light chains: 25.4 mg/L (ref 5.7–26.3)

## 2021-01-31 LAB — BETA 2 MICROGLOBULIN, SERUM: Beta-2 Microglobulin: 2.4 mg/L (ref 0.6–2.4)

## 2021-02-02 ENCOUNTER — Other Ambulatory Visit: Payer: Self-pay

## 2021-02-02 ENCOUNTER — Encounter: Payer: Self-pay | Admitting: Cardiovascular Disease

## 2021-02-02 ENCOUNTER — Ambulatory Visit: Payer: Medicare HMO | Admitting: Cardiovascular Disease

## 2021-02-02 VITALS — BP 132/50 | HR 66 | Ht 64.0 in | Wt 146.0 lb

## 2021-02-02 DIAGNOSIS — I701 Atherosclerosis of renal artery: Secondary | ICD-10-CM | POA: Diagnosis not present

## 2021-02-02 DIAGNOSIS — Z720 Tobacco use: Secondary | ICD-10-CM | POA: Diagnosis not present

## 2021-02-02 DIAGNOSIS — I10 Essential (primary) hypertension: Secondary | ICD-10-CM

## 2021-02-02 DIAGNOSIS — Z951 Presence of aortocoronary bypass graft: Secondary | ICD-10-CM

## 2021-02-02 DIAGNOSIS — I739 Peripheral vascular disease, unspecified: Secondary | ICD-10-CM

## 2021-02-02 NOTE — Patient Instructions (Signed)

## 2021-02-03 LAB — MULTIPLE MYELOMA PANEL, SERUM
Albumin SerPl Elph-Mcnc: 3.5 g/dL (ref 2.9–4.4)
Albumin/Glob SerPl: 1.2 (ref 0.7–1.7)
Alpha 1: 0.2 g/dL (ref 0.0–0.4)
Alpha2 Glob SerPl Elph-Mcnc: 0.8 g/dL (ref 0.4–1.0)
B-Globulin SerPl Elph-Mcnc: 1.1 g/dL (ref 0.7–1.3)
Gamma Glob SerPl Elph-Mcnc: 0.9 g/dL (ref 0.4–1.8)
Globulin, Total: 3 g/dL (ref 2.2–3.9)
IgA: 436 mg/dL (ref 61–437)
IgG (Immunoglobin G), Serum: 1011 mg/dL (ref 603–1613)
IgM (Immunoglobulin M), Srm: 57 mg/dL (ref 15–143)
M Protein SerPl Elph-Mcnc: 0.3 g/dL — ABNORMAL HIGH
Total Protein ELP: 6.5 g/dL (ref 6.0–8.5)

## 2021-02-06 ENCOUNTER — Telehealth: Payer: Self-pay | Admitting: *Deleted

## 2021-02-06 NOTE — Telephone Encounter (Signed)
[name] patient regarding recent lab results.  Spoke with his daughter, Levada Dy.  Advised that pt's labs are stable and that Dr. Lorenso Courier will see him in 6 months for continued follow up.  Levada Dy voiced understanding and will inform her father of this call.

## 2021-02-06 NOTE — Telephone Encounter (Signed)
-----   Message from Orson Slick, MD sent at 02/04/2021  3:13 PM EST ----- Please let Mr. Caleb [name] know that his MGUS labs are stable. We will plan to see him back in 6 months time for continued monitoring.   ----- Message ----- From: Interface, Lab In [place] Sent: 01/30/2021   2:34 PM EST To: Orson Slick, MD

## 2021-07-04 ENCOUNTER — Telehealth: Payer: Self-pay | Admitting: Hematology and Oncology

## 2021-07-04 NOTE — Telephone Encounter (Signed)
Patient called in to reschedule July appointments to late August and September due to patient's request. Appointments have been moved.

## 2021-07-31 ENCOUNTER — Other Ambulatory Visit: Payer: Medicare HMO

## 2021-08-07 ENCOUNTER — Ambulatory Visit: Payer: Medicare HMO | Admitting: Hematology and Oncology

## 2021-09-11 ENCOUNTER — Inpatient Hospital Stay: Payer: Medicare HMO | Attending: Hematology and Oncology

## 2021-09-11 ENCOUNTER — Other Ambulatory Visit: Payer: Self-pay

## 2021-09-11 ENCOUNTER — Other Ambulatory Visit: Payer: Self-pay | Admitting: *Deleted

## 2021-09-11 DIAGNOSIS — D472 Monoclonal gammopathy: Secondary | ICD-10-CM | POA: Diagnosis not present

## 2021-09-11 LAB — CBC WITH DIFFERENTIAL (CANCER CENTER ONLY)
Abs Immature Granulocytes: 0.02 10*3/uL (ref 0.00–0.07)
Basophils Absolute: 0.1 10*3/uL (ref 0.0–0.1)
Basophils Relative: 1 %
Eosinophils Absolute: 0.1 10*3/uL (ref 0.0–0.5)
Eosinophils Relative: 2 %
HCT: 36.5 % — ABNORMAL LOW (ref 39.0–52.0)
Hemoglobin: 12.9 g/dL — ABNORMAL LOW (ref 13.0–17.0)
Immature Granulocytes: 0 %
Lymphocytes Relative: 14 %
Lymphs Abs: 0.9 10*3/uL (ref 0.7–4.0)
MCH: 35.8 pg — ABNORMAL HIGH (ref 26.0–34.0)
MCHC: 35.3 g/dL (ref 30.0–36.0)
MCV: 101.4 fL — ABNORMAL HIGH (ref 80.0–100.0)
Monocytes Absolute: 0.6 10*3/uL (ref 0.1–1.0)
Monocytes Relative: 9 %
Neutro Abs: 4.8 10*3/uL (ref 1.7–7.7)
Neutrophils Relative %: 74 %
Platelet Count: 119 10*3/uL — ABNORMAL LOW (ref 150–400)
RBC: 3.6 MIL/uL — ABNORMAL LOW (ref 4.22–5.81)
RDW: 12.4 % (ref 11.5–15.5)
WBC Count: 6.5 10*3/uL (ref 4.0–10.5)
nRBC: 0 % (ref 0.0–0.2)

## 2021-09-11 LAB — LACTATE DEHYDROGENASE: LDH: 125 U/L (ref 98–192)

## 2021-09-11 LAB — CMP (CANCER CENTER ONLY)
ALT: 8 U/L (ref 0–44)
AST: 14 U/L — ABNORMAL LOW (ref 15–41)
Albumin: 4.2 g/dL (ref 3.5–5.0)
Alkaline Phosphatase: 67 U/L (ref 38–126)
Anion gap: 5 (ref 5–15)
BUN: 13 mg/dL (ref 8–23)
CO2: 27 mmol/L (ref 22–32)
Calcium: 9.8 mg/dL (ref 8.9–10.3)
Chloride: 97 mmol/L — ABNORMAL LOW (ref 98–111)
Creatinine: 0.96 mg/dL (ref 0.61–1.24)
GFR, Estimated: >60 mL/min (ref >60)
Glucose, Bld: 93 mg/dL (ref 70–99)
Potassium: 4.5 mmol/L (ref 3.5–5.1)
Sodium: 129 mmol/L — ABNORMAL LOW (ref 135–145)
Total Bilirubin: 0.7 mg/dL (ref 0.3–1.2)
Total Protein: 7 g/dL (ref 6.5–8.1)

## 2021-09-12 LAB — KAPPA/LAMBDA LIGHT CHAINS
Kappa free light chain: 38.3 mg/L — ABNORMAL HIGH (ref 3.3–19.4)
Kappa, lambda light chain ratio: 1.53 (ref 0.26–1.65)
Lambda free light chains: 25.1 mg/L (ref 5.7–26.3)

## 2021-09-15 LAB — MULTIPLE MYELOMA PANEL, SERUM
Albumin SerPl Elph-Mcnc: 3.8 g/dL (ref 2.9–4.4)
Albumin/Glob SerPl: 1.3 (ref 0.7–1.7)
Alpha 1: 0.2 g/dL (ref 0.0–0.4)
Alpha2 Glob SerPl Elph-Mcnc: 0.7 g/dL (ref 0.4–1.0)
B-Globulin SerPl Elph-Mcnc: 1.1 g/dL (ref 0.7–1.3)
Gamma Glob SerPl Elph-Mcnc: 0.9 g/dL (ref 0.4–1.8)
Globulin, Total: 3 g/dL (ref 2.2–3.9)
IgA: 452 mg/dL — ABNORMAL HIGH (ref 61–437)
IgG (Immunoglobin G), Serum: 1028 mg/dL (ref 603–1613)
IgM (Immunoglobulin M), Srm: 52 mg/dL (ref 15–143)
M Protein SerPl Elph-Mcnc: 0.5 g/dL — ABNORMAL HIGH
Total Protein ELP: 6.8 g/dL (ref 6.0–8.5)

## 2021-09-20 ENCOUNTER — Inpatient Hospital Stay: Payer: Medicare HMO | Admitting: Hematology and Oncology

## 2021-09-29 ENCOUNTER — Ambulatory Visit: Payer: Medicare HMO | Admitting: Hematology and Oncology

## 2021-10-04 ENCOUNTER — Other Ambulatory Visit: Payer: Self-pay

## 2021-10-04 ENCOUNTER — Inpatient Hospital Stay: Payer: Medicare HMO | Attending: Hematology and Oncology | Admitting: Hematology and Oncology

## 2021-10-04 ENCOUNTER — Inpatient Hospital Stay: Payer: Medicare HMO

## 2021-10-04 VITALS — BP 137/51 | HR 56 | Temp 97.7°F | Wt 151.8 lb

## 2021-10-04 DIAGNOSIS — F17210 Nicotine dependence, cigarettes, uncomplicated: Secondary | ICD-10-CM | POA: Insufficient documentation

## 2021-10-04 DIAGNOSIS — Z7901 Long term (current) use of anticoagulants: Secondary | ICD-10-CM | POA: Diagnosis not present

## 2021-10-04 DIAGNOSIS — D472 Monoclonal gammopathy: Secondary | ICD-10-CM | POA: Diagnosis present

## 2021-10-04 DIAGNOSIS — D696 Thrombocytopenia, unspecified: Secondary | ICD-10-CM | POA: Diagnosis not present

## 2021-10-04 DIAGNOSIS — Z79899 Other long term (current) drug therapy: Secondary | ICD-10-CM | POA: Insufficient documentation

## 2021-10-04 DIAGNOSIS — D649 Anemia, unspecified: Secondary | ICD-10-CM | POA: Diagnosis not present

## 2021-10-04 DIAGNOSIS — Z7982 Long term (current) use of aspirin: Secondary | ICD-10-CM | POA: Diagnosis not present

## 2021-10-04 NOTE — Progress Notes (Signed)
[facility] [phone]   [phone]  PROGRESS NOTE  Patient Care Team: Mattie Marlin, DO as PCP - General (Family Medicine) Wendall Stade, MD as PCP - Cardiology (Cardiology)  Hematological/Oncological History #IgG Kappa Monoclonal Gammopathy of Undetermined Significance 1) 03/07/2020: Multiple Myeloma Panel revealed M potein 0.5 with IFE showing IgG monoclonal protein with kappa light chain specificity. 2) 04/12/2020: Establish care with Georga Kaufmann PA-C  Interval History:  Caleb [name] 71 y.o. male with medical history significant for IgG Kappa MGUS who presents for a follow up visit. The patient's last visit was on 01/30/2021. In the interim since the last visit he has had no major changes in his health.   Caleb [name] accompanied by his daughter.  He reports he has been "all right" in the interim since her last visit.  He has had no major changes in his health.  He continues to take alendronate once weekly.  He notes his energy [name] an 8 or 9 out of 10.  He feels like he [name] eating well.  He [name] not having any new bone or back pain.  He notes that his weight has been steady.  He also denies any bleeding, bruising, or dark stools.  Overall he feels well.  He denies any nausea, vomiting, or diarrhea.  He otherwise reports that he feels well with no new symptoms.  A full 10 point ROS [name] listed below.  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   LIMA to LAD, RIMA to RCA, SVG to OM1, EVH via right thigh   Tobacco abuse     SURGICAL HISTORY: Past Surgical History:  Procedure Laterality Date   BIOPSY  10/31/2018   Procedure: BIOPSY;  Surgeon: Jeani Hawking, MD;  Location: [facility];  Service:  Endoscopy;;   cataract surgery  Bilateral 12-2017 and 01-2018   COLONOSCOPY WITH PROPOFOL N/A 10/31/2018   Procedure: COLONOSCOPY WITH PROPOFOL;  Surgeon: Jeani Hawking, MD;  Location: [facility];  Service: Endoscopy;  Laterality: N/A;   COLONOSCOPY WITH PROPOFOL N/A 09/18/2019   Procedure: COLONOSCOPY WITH PROPOFOL;  Surgeon: Jeani Hawking, MD;  Location: [facility];  Service: Endoscopy;  Laterality: N/A;   COLONOSCOPY WITH PROPOFOL N/A 01/20/2021   Procedure: COLONOSCOPY WITH PROPOFOL;  Surgeon: Jeani Hawking, MD;  Location: [facility];  Service: Endoscopy;  Laterality: N/A;   CORONARY ARTERY BYPASS GRAFT N/A 02/17/2014   Procedure: CORONARY ARTERY BYPASS GRAFTING (CABG);  Surgeon: Purcell Nails, MD;  Location: [facility] OR;  Service: Open Heart Surgery;  Laterality: N/A;  Times 3 using bilateral mammary arteries and endoscopically harvested right saphenous vein   ESOPHAGOGASTRODUODENOSCOPY (EGD) WITH PROPOFOL N/A 10/31/2018   Procedure: ESOPHAGOGASTRODUODENOSCOPY (EGD) WITH PROPOFOL;  Surgeon: Jeani Hawking, MD;  Location: [facility];  Service: Endoscopy;  Laterality: N/A;   ESOPHAGOGASTRODUODENOSCOPY (EGD) WITH PROPOFOL N/A 09/18/2019   Procedure: ESOPHAGOGASTRODUODENOSCOPY (EGD) WITH PROPOFOL;  Surgeon: Jeani Hawking, MD;  Location: [facility];  Service: Endoscopy;  Laterality: N/A;   FEMORAL ARTERY - FEMORAL ARTERY BYPASS GRAFT      right femoral artery to below-knee popliteal artery bypass with PTFE and right first ray amputation 04/25/17   HEMOSTASIS CLIP PLACEMENT  10/31/2018   Procedure: HEMOSTASIS CLIP PLACEMENT;  Surgeon: Carol Ada, MD;  Location: [facility];  Service: Endoscopy;;   HEMOSTASIS CLIP PLACEMENT  09/18/2019   Procedure: HEMOSTASIS CLIP PLACEMENT;  Surgeon: Carol Ada, MD;  Location: [facility];  Service: Endoscopy;;   HEMOSTASIS CLIP PLACEMENT  01/20/2021   Procedure: HEMOSTASIS CLIP PLACEMENT;  Surgeon: Carol Ada, MD;  Location: [facility];  Service:  Endoscopy;;   INTRAOPERATIVE TRANSESOPHAGEAL ECHOCARDIOGRAM N/A 02/17/2014   Procedure: INTRAOPERATIVE TRANSESOPHAGEAL ECHOCARDIOGRAM;  Surgeon: Rexene Alberts, MD;  Location: [place];  Service: Open Heart Surgery;  Laterality: N/A;   LEFT HEART CATHETERIZATION WITH CORONARY ANGIOGRAM N/A 02/12/2014   Procedure: LEFT HEART CATHETERIZATION WITH CORONARY ANGIOGRAM;  Surgeon: Sinclair Grooms, MD;  Location: [facility];  Service: Cardiovascular;  Laterality: N/A;   POLYPECTOMY  10/31/2018   Procedure: POLYPECTOMY;  Surgeon: Carol Ada, MD;  Location: [facility];  Service: Endoscopy;;   POLYPECTOMY  09/18/2019   Procedure: POLYPECTOMY;  Surgeon: Carol Ada, MD;  Location: [facility];  Service: Endoscopy;;   POLYPECTOMY  01/20/2021   Procedure: POLYPECTOMY;  Surgeon: Carol Ada, MD;  Location: [facility];  Service: Endoscopy;;   SCLEROTHERAPY  09/18/2019   Procedure: Clide Deutscher;  Surgeon: Carol Ada, MD;  Location: [facility];  Service: Endoscopy;;   SUBMUCOSAL INJECTION  09/18/2019   Procedure: SUBMUCOSAL INJECTION;  Surgeon: Carol Ada, MD;  Location: [facility];  Service: Endoscopy;;   SUBMUCOSAL LIFTING INJECTION  10/31/2018   Procedure: SUBMUCOSAL LIFTING INJECTION;  Surgeon: Carol Ada, MD;  Location: [facility];  Service: Endoscopy;;   TOE AMPUTATION Right    right great toe     SOCIAL HISTORY: Social History   Socioeconomic History   Marital status: Single    Spouse name: Not on file   Number of children: Not on file   Years of education: Not on file   Highest education level: Not on file  Occupational History   Occupation: metal work  Tobacco Use   Smoking status: Every Day    Packs/day: 0.50    Years: 35.00    Total pack years: 17.50    Types: Cigarettes    Last attempt to quit: 02/11/2014    Years since quitting: 7.6   Smokeless tobacco: Never   Tobacco comments:    has quit twice before   Vaping Use   Vaping Use: Never used  Substance and  Sexual Activity   Alcohol use: Yes    Alcohol/week: 0.0 standard drinks of alcohol    Comment: Six pack a day of beer   Drug use: Never   Sexual activity: Not on file  Other Topics Concern   Not on file  Social History Narrative   Not on file   Social Determinants of Health   Financial Resource Strain: Not on file  Food Insecurity: Not on file  Transportation Needs: Not on file  Physical Activity: Not on file  Stress: Not on file  Social Connections: Not on file  Intimate Partner Violence: Not on file    FAMILY HISTORY: Family History  Problem Relation Age of Onset   Heart attack Mother    Diabetes Mother    Heart attack Brother    Diabetes Brother    Cancer Sister    Stroke Neg Hx     ALLERGIES:  [name] allergic to sulfamethoxazole-trimethoprim, cilostazol, lisinopril, and losartan.  MEDICATIONS:  Current Outpatient Medications  Medication Sig Dispense Refill   acetaminophen (TYLENOL) 500 MG tablet Take 1,000 mg by mouth 2 (two) times daily as needed  for mild pain or moderate pain.      albuterol (VENTOLIN HFA) 108 (90 Base) MCG/ACT inhaler Inhale 2 puffs into the lungs every 6 (six) hours as needed for wheezing or shortness of breath. 18 g 3   amLODipine (NORVASC) 5 MG tablet Take 0.5 tablets (2.5 mg total) by mouth daily. (Patient not taking: Reported on 02/02/2021)     amLODipine (NORVASC) 5 MG tablet Take 5 mg by mouth daily.     aspirin EC 81 MG tablet Take 81 mg by mouth daily. Swallow whole.     atorvastatin (LIPITOR) 80 MG tablet Take 1 tablet (80 mg total) by mouth daily. 90 tablet 3   carvedilol (COREG) 25 MG tablet Take 25 mg by mouth 2 (two) times daily with a meal.     Cholecalciferol (VITAMIN D-3 PO) Take 1,000 Units by mouth daily with breakfast.     denosumab (PROLIA) 60 MG/ML SOSY injection Inject 1 mL into the skin every 6 (six) months.     ferrous sulfate 325 (65 FE) MG tablet Take 325 mg by mouth daily with breakfast.     folic acid (FOLVITE) 1 MG  tablet Take 1 mg by mouth daily.     irbesartan (AVAPRO) 300 MG tablet Take 300 mg by mouth daily.     nitroGLYCERIN (NITROSTAT) 0.4 MG SL tablet Place 0.4 mg under the tongue every 5 (five) minutes as needed for chest pain.     Olopatadine HCl 0.2 % SOLN Place 1 drop into both eyes daily as needed (allergies).     pantoprazole (PROTONIX) 40 MG tablet Take 40 mg by mouth daily.     pregabalin (LYRICA) 75 MG capsule Take 75 mg by mouth 3 (three) times daily.     rivaroxaban (XARELTO) 2.5 MG TABS tablet Take 2.5 mg by mouth 2 (two) times daily.     thiamine 100 MG tablet Take 100 mg by mouth daily.     vitamin B-12 (CYANOCOBALAMIN) 1000 MCG tablet Take 1,000 mcg by mouth daily with breakfast.     No current facility-administered medications for this visit.    REVIEW OF SYSTEMS:   Constitutional: ( - ) fevers, ( - )  chills , ( - ) night sweats Eyes: ( - ) blurriness of vision, ( - ) double vision, ( - ) watery eyes Ears, nose, mouth, throat, and face: ( - ) mucositis, ( - ) sore throat Respiratory: ( - ) cough, ( - ) dyspnea, ( - ) wheezes Cardiovascular: ( - ) palpitation, ( - ) chest discomfort, ( - ) lower extremity swelling Gastrointestinal:  ( - ) nausea, ( - ) heartburn, ( - ) change in bowel habits Skin: ( - ) abnormal skin rashes Lymphatics: ( - ) new lymphadenopathy, ( - ) easy bruising Neurological: ( - ) numbness, ( - ) tingling, ( - ) new weaknesses Behavioral/Psych: ( - ) mood change, ( - ) new changes  All other systems were reviewed with the patient and are negative.  PHYSICAL EXAMINATION: ECOG PERFORMANCE STATUS: 0 - Asymptomatic  Vitals:   10/04/21 1518  BP: (!) 137/51  Pulse: (!) 56  Temp: 97.7 F (36.5 C)  SpO2: 100%    Filed Weights   10/04/21 1518  Weight: 151 lb 12.8 oz (68.9 kg)     GENERAL: Well-appearing elderly male, alert, no distress and comfortable SKIN: skin color, texture, turgor are normal, no rashes or significant lesions EYES: conjunctiva  are pink and non-injected, sclera clear  LUNGS: clear to auscultation and percussion with normal breathing effort HEART: regular rate & rhythm and no murmurs and no lower extremity edema PSYCH: alert & oriented x 3, fluent speech NEURO: no focal motor/sensory deficits  LABORATORY DATA:  I have reviewed the data as listed    Latest Ref Rng & Units 09/11/2021   10:28 AM 01/30/2021    2:21 PM 09/06/2020    5:41 AM  CBC  WBC 4.0 - 10.5 K/uL 6.5  6.7  9.1   Hemoglobin 13.0 - 17.0 g/dL 12.9  13.8  13.1   Hematocrit 39.0 - 52.0 % 36.5  39.7  35.1   Platelets 150 - 400 K/uL 119  188  163        Latest Ref Rng & Units 09/11/2021   10:28 AM 01/30/2021    2:21 PM 09/09/2020    1:25 AM  CMP  Glucose 70 - 99 mg/dL 93  98  128   BUN 8 - 23 mg/dL $Remove'13  10  16   'TyOpXgr$ Creatinine 0.61 - 1.24 mg/dL 0.96  0.89  0.91   Sodium 135 - 145 mmol/L 129  131  130   Potassium 3.5 - 5.1 mmol/L 4.5  4.6  4.0   Chloride 98 - 111 mmol/L 97  97  95   CO2 22 - 32 mmol/L $RemoveB'27  27  25   'UQUlWPkD$ Calcium 8.9 - 10.3 mg/dL 9.8  9.4  9.7   Total Protein 6.5 - 8.1 g/dL 7.0  6.9    Total Bilirubin 0.3 - 1.2 mg/dL 0.7  0.7    Alkaline Phos 38 - 126 U/L 67  73    AST 15 - 41 U/L 14  11    ALT 0 - 44 U/L 8  6      Lab Results  Component Value Date   MPROTEIN 0.5 (H) 09/11/2021   MPROTEIN 0.3 (H) 01/30/2021   MPROTEIN 0.5 (H) 08/01/2020   Lab Results  Component Value Date   KPAFRELGTCHN 38.3 (H) 09/11/2021   KPAFRELGTCHN 38.3 (H) 01/30/2021   KPAFRELGTCHN 30.6 (H) 08/01/2020   LAMBDASER 25.1 09/11/2021   LAMBDASER 25.4 01/30/2021   LAMBDASER 23.4 08/01/2020   KAPLAMBRATIO 1.53 09/11/2021   KAPLAMBRATIO 1.51 01/30/2021   KAPLAMBRATIO 1.31 08/01/2020    RADIOGRAPHIC STUDIES: No results found.  ASSESSMENT & PLAN EQUAN COGBILL 71 y.o. male with medical history significant for IgG Kappa MGUS who presents for a follow up visit.   #IgG Kappa Monoclonal Gammopathy of Undetermined Significance --SPEP with IFE from 09/11/2021 showed  M-spike of 0.5, IgG monoclonal protein with kappa light chain specificity. K/L ratio [name] within normal limits with no CRAB criteria.  --today will repeat CBC w/ diff, CMP, LDH, kappa/lambda light chains. Labs show WBC 6.5, Hgb 12.9, MCV 101.4, Plt 119 --Mild increase in protein in the urine.  We will repeat UPEP on a yearly basis, last performed in April 2022.  --DG bone met survey showed no lytic lesions.  Repeat this on a yearly basis. Last performed in April 2022 -- No indication for a bone marrow biopsy based on above results. --RTC in 6 months with labs unless further workup [name] needed.   # Mild Anemia/Thrombocytopenia -- Patient had unexpected drop in hemoglobin down to 12.9 with an increase in MCV to 101.4 and platelets of 119. -- Return for labs in 4 weeks time in order to reassess  No orders of the defined types were placed in this encounter.    All  questions were answered. The patient knows to call the clinic with any problems, questions or concerns.  A total of more than 30 minutes were spent on this encounter with face-to-face time and non-face-to-face time, including preparing to see the patient, ordering tests and/or medications, counseling the patient and coordination of care as outlined above.   Ledell Peoples, MD Department of Hematology/Oncology [place] [facility] Phone: [phone] Pager: [phone] Email: [email or web address]  10/04/2021 3:47 PM

## 2021-10-05 ENCOUNTER — Telehealth: Payer: Self-pay | Admitting: Hematology and Oncology

## 2021-10-05 NOTE — Telephone Encounter (Signed)
Per 9/20 los called and spoke to pt daughter about appointments

## 2021-10-12 ENCOUNTER — Other Ambulatory Visit: Payer: Self-pay | Admitting: Gastroenterology

## 2021-10-18 ENCOUNTER — Ambulatory Visit (HOSPITAL_COMMUNITY)
Admission: RE | Admit: 2021-10-18 | Discharge: 2021-10-18 | Disposition: A | Discharge status: Home / Self Care | Payer: Medicare HMO | Source: Ambulatory Visit | Attending: Hematology and Oncology | Admitting: Hematology and Oncology

## 2021-10-18 DIAGNOSIS — D472 Monoclonal gammopathy: Secondary | ICD-10-CM | POA: Diagnosis present

## 2021-10-23 ENCOUNTER — Telehealth: Payer: Self-pay | Admitting: *Deleted

## 2021-10-23 NOTE — Telephone Encounter (Signed)
[name] patient regarding bone survey results. No answer.  Unable to leave vm message. Called pt on home phone and mobile phone.

## 2021-10-23 NOTE — Telephone Encounter (Signed)
-----   Message from Orson Slick, MD sent at 10/20/2021  1:33 PM EDT ----- Please call Mr. Schoeppner and let him know that his metastatic bone survey did not show any concerning abnormalities.  Additionally we have labs scheduled for him on 11/02/2021 ----- Message ----- From: Interface, Rad Results In Sent: 10/20/2021  12:47 PM EDT To: Orson Slick, MD

## 2021-10-27 ENCOUNTER — Encounter (HOSPITAL_COMMUNITY): Payer: Self-pay | Admitting: Gastroenterology

## 2021-11-02 ENCOUNTER — Other Ambulatory Visit: Payer: Self-pay

## 2021-11-02 ENCOUNTER — Inpatient Hospital Stay: Payer: Medicare HMO | Attending: Hematology and Oncology

## 2021-11-02 DIAGNOSIS — D472 Monoclonal gammopathy: Secondary | ICD-10-CM | POA: Diagnosis present

## 2021-11-02 DIAGNOSIS — D649 Anemia, unspecified: Secondary | ICD-10-CM | POA: Insufficient documentation

## 2021-11-02 DIAGNOSIS — D696 Thrombocytopenia, unspecified: Secondary | ICD-10-CM | POA: Diagnosis not present

## 2021-11-02 LAB — FERRITIN: Ferritin: 211 ng/mL (ref 24–336)

## 2021-11-02 LAB — CMP (CANCER CENTER ONLY)
ALT: 7 U/L (ref 0–44)
AST: 14 U/L — ABNORMAL LOW (ref 15–41)
Albumin: 3.8 g/dL (ref 3.5–5.0)
Alkaline Phosphatase: 83 U/L (ref 38–126)
Anion gap: 8 (ref 5–15)
BUN: 10 mg/dL (ref 8–23)
CO2: 26 mmol/L (ref 22–32)
Calcium: 9.3 mg/dL (ref 8.9–10.3)
Chloride: 94 mmol/L — ABNORMAL LOW (ref 98–111)
Creatinine: 0.89 mg/dL (ref 0.61–1.24)
GFR, Estimated: >60 mL/min (ref >60)
Glucose, Bld: 95 mg/dL (ref 70–99)
Potassium: 4.5 mmol/L (ref 3.5–5.1)
Sodium: 128 mmol/L — ABNORMAL LOW (ref 135–145)
Total Bilirubin: 0.5 mg/dL (ref 0.3–1.2)
Total Protein: 7.2 g/dL (ref 6.5–8.1)

## 2021-11-02 LAB — CBC WITH DIFFERENTIAL (CANCER CENTER ONLY)
Abs Immature Granulocytes: 0.02 10*3/uL (ref 0.00–0.07)
Basophils Absolute: 0 10*3/uL (ref 0.0–0.1)
Basophils Relative: 1 %
Eosinophils Absolute: 0.1 10*3/uL (ref 0.0–0.5)
Eosinophils Relative: 1 %
HCT: 36.4 % — ABNORMAL LOW (ref 39.0–52.0)
Hemoglobin: 12.7 g/dL — ABNORMAL LOW (ref 13.0–17.0)
Immature Granulocytes: 0 %
Lymphocytes Relative: 12 %
Lymphs Abs: 0.9 10*3/uL (ref 0.7–4.0)
MCH: 34.9 pg — ABNORMAL HIGH (ref 26.0–34.0)
MCHC: 34.9 g/dL (ref 30.0–36.0)
MCV: 100 fL (ref 80.0–100.0)
Monocytes Absolute: 0.5 10*3/uL (ref 0.1–1.0)
Monocytes Relative: 7 %
Neutro Abs: 5.7 10*3/uL (ref 1.7–7.7)
Neutrophils Relative %: 79 %
Platelet Count: 155 10*3/uL (ref 150–400)
RBC: 3.64 MIL/uL — ABNORMAL LOW (ref 4.22–5.81)
RDW: 12.1 % (ref 11.5–15.5)
WBC Count: 7.2 10*3/uL (ref 4.0–10.5)
nRBC: 0 % (ref 0.0–0.2)

## 2021-11-02 LAB — IRON AND IRON BINDING CAPACITY (CC-WL,HP ONLY)
Iron: 59 ug/dL (ref 45–182)
Saturation Ratios: 24 % (ref 17.9–39.5)
TIBC: 251 ug/dL (ref 250–450)
UIBC: 192 ug/dL (ref 117–376)

## 2021-11-02 LAB — RETIC PANEL
Immature Retic Fract: 16.7 % — ABNORMAL HIGH (ref 2.3–15.9)
RBC.: 3.49 MIL/uL — ABNORMAL LOW (ref 4.22–5.81)
Retic Count, Absolute: 43.3 10*3/uL (ref 19.0–186.0)
Retic Ct Pct: 1.2 % (ref 0.4–3.1)
Reticulocyte Hemoglobin: 37.2 pg (ref >27.9)

## 2021-11-02 LAB — LACTATE DEHYDROGENASE: LDH: 143 U/L (ref 98–192)

## 2021-11-02 LAB — FOLATE: Folate: 20.8 ng/mL (ref >5.9)

## 2021-11-02 LAB — VITAMIN B12: Vitamin B-12: 1207 pg/mL — ABNORMAL HIGH (ref 180–914)

## 2021-11-02 NOTE — Anesthesia Preprocedure Evaluation (Addendum)
Anesthesia Evaluation  Patient identified by MRN, date of birth, ID band Patient awake    Reviewed: Allergy & Precautions, NPO status , Patient's Chart, lab work & pertinent test results, reviewed documented beta blocker date and time   Airway Mallampati: II  TM Distance: >3 FB Neck ROM: Full    Dental  (+) Edentulous Upper, Dental Advisory Given   Pulmonary COPD,  COPD inhaler, Current Smoker,    Pulmonary exam normal breath sounds clear to auscultation       Cardiovascular hypertension (197/59 in preop, did not take any BP meds today), Pt. on medications and Pt. on home beta blockers + CAD, + CABG (CABG x 3 2016), + Peripheral Vascular Disease and +CHF (LVEF 62-13%, grade 2 diatsolic dysfunction, moderate RV failure)  Normal cardiovascular exam+ Valvular Problems/Murmurs (mild MR) MR  Rhythm:Regular Rate:Normal  Echo 2021 1. Left ventricular ejection fraction, by estimation, is 45 to 50%. The  left ventricle has mildly decreased function. The left ventricle  demonstrates global hypokinesis. Left ventricular diastolic parameters are  consistent with Grade II diastolic  dysfunction (pseudonormalization). Elevated left ventricular end-diastolic  pressure.  2. Right ventricular systolic function is moderately reduced. The right  ventricular size is normal. There is normal pulmonary artery systolic  pressure.  3. Left atrial size was mild to moderately dilated.  4. The mitral valve is normal in structure. Mild mitral valve  regurgitation. No evidence of mitral stenosis.  5. Tricuspid valve regurgitation is mild to moderate.  6. The aortic valve is tricuspid. Aortic valve regurgitation is not  visualized. No aortic stenosis is present.  7. The inferior vena cava is normal in size with greater than 50%  respiratory variability, suggesting right atrial pressure of 3 mmHg.    Normal stress test 2021   Neuro/Psych negative  neurological ROS  negative psych ROS   GI/Hepatic negative GI ROS, Neg liver ROS,   Endo/Other  Chronic hypoNa, most recently 85 yesterday   Renal/GU   negative genitourinary   Musculoskeletal negative musculoskeletal ROS (+)   Abdominal   Peds  Hematology  (+) Blood dyscrasia, anemia , Hb 12.7   Anesthesia Other Findings xarelto  Reproductive/Obstetrics negative OB ROS                        Anesthesia Physical Anesthesia Plan  ASA: 4  Anesthesia Plan: MAC   Post-op Pain Management:    Induction:   PONV Risk Score and Plan: 2 and Propofol infusion and TIVA  Airway Management Planned: Natural Airway and Simple Face Mask  Additional Equipment: None  Intra-op Plan:   Post-operative Plan:   Informed Consent: I have reviewed the patients History and Physical, chart, labs and discussed the procedure including the risks, benefits and alternatives for the proposed anesthesia with the patient or authorized representative who has indicated his/her understanding and acceptance.       Plan Discussed with: CRNA  Anesthesia Plan Comments:        Anesthesia Quick Evaluation

## 2021-11-03 ENCOUNTER — Encounter (HOSPITAL_COMMUNITY)
Admission: RE | Disposition: A | Discharge status: Home / Self Care | Payer: Self-pay | Source: Ambulatory Visit | Attending: Gastroenterology

## 2021-11-03 ENCOUNTER — Ambulatory Visit (HOSPITAL_COMMUNITY): Payer: Medicare HMO | Admitting: Anesthesiology

## 2021-11-03 ENCOUNTER — Ambulatory Visit (HOSPITAL_BASED_OUTPATIENT_CLINIC_OR_DEPARTMENT_OTHER): Payer: Medicare HMO | Admitting: Anesthesiology

## 2021-11-03 ENCOUNTER — Encounter (HOSPITAL_COMMUNITY): Payer: Self-pay | Admitting: Gastroenterology

## 2021-11-03 ENCOUNTER — Ambulatory Visit (HOSPITAL_COMMUNITY)
Admission: RE | Admit: 2021-11-03 | Discharge: 2021-11-03 | Disposition: A | Discharge status: Home / Self Care | Payer: Medicare HMO | Source: Ambulatory Visit | Attending: Gastroenterology | Admitting: Gastroenterology

## 2021-11-03 DIAGNOSIS — Z86010 Personal history of colonic polyps: Secondary | ICD-10-CM | POA: Diagnosis not present

## 2021-11-03 DIAGNOSIS — K635 Polyp of colon: Secondary | ICD-10-CM | POA: Diagnosis not present

## 2021-11-03 DIAGNOSIS — D123 Benign neoplasm of transverse colon: Secondary | ICD-10-CM | POA: Diagnosis not present

## 2021-11-03 DIAGNOSIS — I110 Hypertensive heart disease with heart failure: Secondary | ICD-10-CM | POA: Insufficient documentation

## 2021-11-03 DIAGNOSIS — Z951 Presence of aortocoronary bypass graft: Secondary | ICD-10-CM | POA: Insufficient documentation

## 2021-11-03 DIAGNOSIS — Z09 Encounter for follow-up examination after completed treatment for conditions other than malignant neoplasm: Secondary | ICD-10-CM | POA: Diagnosis present

## 2021-11-03 DIAGNOSIS — Z79899 Other long term (current) drug therapy: Secondary | ICD-10-CM | POA: Insufficient documentation

## 2021-11-03 DIAGNOSIS — I2510 Atherosclerotic heart disease of native coronary artery without angina pectoris: Secondary | ICD-10-CM | POA: Insufficient documentation

## 2021-11-03 DIAGNOSIS — E871 Hypo-osmolality and hyponatremia: Secondary | ICD-10-CM | POA: Insufficient documentation

## 2021-11-03 DIAGNOSIS — J449 Chronic obstructive pulmonary disease, unspecified: Secondary | ICD-10-CM | POA: Diagnosis not present

## 2021-11-03 DIAGNOSIS — I5022 Chronic systolic (congestive) heart failure: Secondary | ICD-10-CM | POA: Insufficient documentation

## 2021-11-03 DIAGNOSIS — F17210 Nicotine dependence, cigarettes, uncomplicated: Secondary | ICD-10-CM | POA: Diagnosis not present

## 2021-11-03 DIAGNOSIS — D649 Anemia, unspecified: Secondary | ICD-10-CM | POA: Insufficient documentation

## 2021-11-03 HISTORY — PX: COLONOSCOPY WITH PROPOFOL: SHX5780

## 2021-11-03 HISTORY — PX: POLYPECTOMY: SHX5525

## 2021-11-03 SURGERY — COLONOSCOPY WITH PROPOFOL
Anesthesia: Monitor Anesthesia Care

## 2021-11-03 MED ORDER — LIDOCAINE 2% (20 MG/ML) 5 ML SYRINGE
INTRAMUSCULAR | Status: DC | PRN
  Administered 2021-11-03: 80 mg via INTRAVENOUS

## 2021-11-03 MED ORDER — SODIUM CHLORIDE 0.9 % IV SOLN: INTRAVENOUS | Status: DC

## 2021-11-03 MED ORDER — PROPOFOL 1000 MG/100ML IV EMUL
INTRAVENOUS | Status: AC
  Filled 2021-11-03: qty 100

## 2021-11-03 MED ORDER — PROPOFOL 500 MG/50ML IV EMUL
INTRAVENOUS | Status: DC | PRN
  Administered 2021-11-03: 125 ug/kg/min via INTRAVENOUS

## 2021-11-03 MED ORDER — PROPOFOL 10 MG/ML IV BOLUS
INTRAVENOUS | Status: DC | PRN
  Administered 2021-11-03: 40 mg via INTRAVENOUS

## 2021-11-03 MED ORDER — LACTATED RINGERS IV SOLN
INTRAVENOUS | Status: DC
  Administered 2021-11-03: 1000 mL via INTRAVENOUS

## 2021-11-03 SURGICAL SUPPLY — 22 items

## 2021-11-03 NOTE — H&P (Signed)
Caleb [name] HPI:  His colonoscopy on 01/20/2021 was positive for multiple adenomas.  The largest were in the ascending colon measuring 2-2.5 cm.   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   LIMA to LAD, RIMA to RCA, SVG to OM1, EVH via right thigh   Tobacco abuse     Past Surgical History:  Procedure Laterality Date   BIOPSY  10/31/2018   Procedure: BIOPSY;  Surgeon: Carol Ada, MD;  Location: [facility];  Service: Endoscopy;;   cataract surgery  Bilateral 12-2017 and 01-2018   COLONOSCOPY WITH PROPOFOL N/A 10/31/2018   Procedure: COLONOSCOPY WITH PROPOFOL;  Surgeon: Carol Ada, MD;  Location: [facility];  Service: Endoscopy;  Laterality: N/A;   COLONOSCOPY WITH PROPOFOL N/A 09/18/2019   Procedure: COLONOSCOPY WITH PROPOFOL;  Surgeon: Carol Ada, MD;  Location: [facility];  Service: Endoscopy;  Laterality: N/A;   COLONOSCOPY WITH PROPOFOL N/A 01/20/2021   Procedure: COLONOSCOPY WITH PROPOFOL;  Surgeon: Carol Ada, MD;  [facility];  Service: Endoscopy;  Laterality: N/A;   CORONARY ARTERY BYPASS GRAFT N/A 02/17/2014   Procedure: CORONARY ARTERY BYPASS GRAFTING (CABG);  Surgeon: Rexene Alberts, MD;  Location: [place];  Service: Open Heart Surgery;  Laterality: N/A;  Times 3 using bilateral mammary arteries and endoscopically harvested right saphenous vein   ESOPHAGOGASTRODUODENOSCOPY (EGD) WITH PROPOFOL N/A 10/31/2018   Procedure: ESOPHAGOGASTRODUODENOSCOPY (EGD) WITH PROPOFOL;  Surgeon: Carol Ada, MD;  [facility];  Service: Endoscopy;  Laterality: N/A;   ESOPHAGOGASTRODUODENOSCOPY (EGD) WITH PROPOFOL N/A 09/18/2019   Procedure: ESOPHAGOGASTRODUODENOSCOPY (EGD) WITH PROPOFOL;  Surgeon: Carol Ada, MD;  [facility];  Service: Endoscopy;  Laterality: N/A;   FEMORAL ARTERY - FEMORAL ARTERY BYPASS GRAFT      right femoral artery to below-knee popliteal artery bypass with PTFE and right first ray amputation 04/25/17   HEMOSTASIS CLIP PLACEMENT  10/31/2018   Procedure: HEMOSTASIS CLIP PLACEMENT;  Surgeon: Carol Ada, MD;  [facility];  Service: Endoscopy;;   HEMOSTASIS CLIP PLACEMENT  09/18/2019   Procedure: HEMOSTASIS CLIP PLACEMENT;  Surgeon: Carol Ada, MD;  [facility];  Service: Endoscopy;;   HEMOSTASIS CLIP PLACEMENT  01/20/2021   Procedure: HEMOSTASIS CLIP PLACEMENT;  Surgeon: Carol Ada, MD;  [facility];  Service: Endoscopy;;   INTRAOPERATIVE TRANSESOPHAGEAL ECHOCARDIOGRAM N/A 02/17/2014   Procedure: INTRAOPERATIVE TRANSESOPHAGEAL ECHOCARDIOGRAM;  Surgeon: Rexene Alberts, MD;  Location: [place];  Service: Open Heart Surgery;  Laterality: N/A;   LEFT HEART CATHETERIZATION WITH CORONARY ANGIOGRAM N/A 02/12/2014   Procedure: LEFT HEART CATHETERIZATION WITH CORONARY ANGIOGRAM;  Surgeon: Sinclair Grooms, MD;  Location: [facility];  Service: Cardiovascular;  Laterality: N/A;   POLYPECTOMY  10/31/2018   Procedure: POLYPECTOMY;  Surgeon: Carol Ada, MD;  [facility];  Service: Endoscopy;;   POLYPECTOMY  09/18/2019   Procedure: POLYPECTOMY;  Surgeon: Carol Ada, MD;  [facility];  Service: Endoscopy;;   POLYPECTOMY  01/20/2021   Procedure: POLYPECTOMY;  Surgeon: Carol Ada, MD;  [facility];  Service: Endoscopy;;   SCLEROTHERAPY  09/18/2019   Procedure: Clide Deutscher;  Surgeon: Carol Ada, MD;  [facility];  Service: Endoscopy;;   SUBMUCOSAL INJECTION  09/18/2019   Procedure: SUBMUCOSAL INJECTION;  Surgeon: Carol Ada,  MD;  [facility];  Service: Endoscopy;;   SUBMUCOSAL LIFTING INJECTION  10/31/2018   Procedure: SUBMUCOSAL LIFTING INJECTION;  Surgeon: Carol Ada, MD;  Location[facility];   Service: Endoscopy;;   TOE AMPUTATION Right    right great toe     Family History  Problem Relation Age of Onset   Heart attack Mother    Diabetes Mother    Heart attack Brother    Diabetes Brother    Cancer Sister    Stroke Neg Hx     Social History:  reports that he has been smoking cigarettes. He has a 17.50 pack-year smoking history. He has never used smokeless tobacco. He reports current alcohol use. He reports that he does not use drugs.  Allergies:  Allergies  Allergen Reactions   Bactrim [Sulfamethoxazole-Trimethoprim] Other (See Comments)    "Made potassium go high and sodium go low"   Lisinopril Cough and Other (See Comments)   Losartan Swelling    Pt's daughter stated it made pt's blood pressure and sodium too low, but pt has been tolerating irbesartan.   Pletal [Cilostazol] Swelling and Other (See Comments)    Site of swelling not recalled by the patient    Medications: Scheduled: Continuous:  sodium chloride     lactated ringers      Results for orders placed or performed in visit on 11/02/21 (from the past 24 hour(s))  Lactate dehydrogenase (LDH)     Status: None   Collection Time: 11/02/21  9:53 AM  Result Value Ref Range   LDH 143 98 - 192 U/L  Vitamin B12     Status: Abnormal   Collection Time: 11/02/21  9:53 AM  Result Value Ref Range   Vitamin B-12 1,207 (H) 180 - 914 pg/mL  Iron and Iron Binding Capacity ([facility] only)     Status: None   Collection Time: 11/02/21  9:53 AM  Result Value Ref Range   Iron 59 45 - 182 ug/dL   TIBC 251 250 - 450 ug/dL   Saturation Ratios 24 17.9 - 39.5 %   UIBC 192 117 - 376 ug/dL  CMP ([facility] only)     Status: Abnormal   Collection Time: 11/02/21  9:53 AM  Result Value Ref Range   Sodium 128 (L) 135 - 145 mmol/L   Potassium 4.5 3.5 - 5.1 mmol/L   Chloride 94 (L) 98 - 111 mmol/L   CO2 26 22 - 32 mmol/L   Glucose, Bld 95 70 - 99 mg/dL   BUN 10 8 - 23 mg/dL   Creatinine 0.89 0.61 - 1.24 mg/dL    Calcium 9.3 8.9 - 10.3 mg/dL   Total Protein 7.2 6.5 - 8.1 g/dL   Albumin 3.8 3.5 - 5.0 g/dL   AST 14 (L) 15 - 41 U/L   ALT 7 0 - 44 U/L   Alkaline Phosphatase 83 38 - 126 U/L   Total Bilirubin 0.5 0.3 - 1.2 mg/dL   GFR, Estimated >60 >60 mL/min   Anion gap 8 5 - 15  CBC with Differential ([facility] Only)     Status: Abnormal   Collection Time: 11/02/21  9:53 AM  Result Value Ref Range   WBC Count 7.2 4.0 - 10.5 K/uL   RBC 3.64 (L) 4.22 - 5.81 MIL/uL   Hemoglobin 12.7 (L) 13.0 - 17.0 g/dL   HCT 36.4 (L) 39.0 - 52.0 %   MCV 100.0 80.0 - 100.0 fL   MCH 34.9 (H) 26.0 -  34.0 pg   MCHC 34.9 30.0 - 36.0 g/dL   RDW 12.1 11.5 - 15.5 %   Platelet Count 155 150 - 400 K/uL   nRBC 0.0 0.0 - 0.2 %   Neutrophils Relative % 79 %   Neutro Abs 5.7 1.7 - 7.7 K/uL   Lymphocytes Relative 12 %   Lymphs Abs 0.9 0.7 - 4.0 K/uL   Monocytes Relative 7 %   Monocytes Absolute 0.5 0.1 - 1.0 K/uL   Eosinophils Relative 1 %   Eosinophils Absolute 0.1 0.0 - 0.5 K/uL   Basophils Relative 1 %   Basophils Absolute 0.0 0.0 - 0.1 K/uL   Immature Granulocytes 0 %   Abs Immature Granulocytes 0.02 0.00 - 0.07 K/uL  Folate, Serum     Status: None   Collection Time: 11/02/21  9:54 AM  Result Value Ref Range   Folate 20.8 >5.9 ng/mL  Retic Panel     Status: Abnormal   Collection Time: 11/02/21  9:54 AM  Result Value Ref Range   Retic Ct Pct 1.2 0.4 - 3.1 %   RBC. 3.49 (L) 4.22 - 5.81 MIL/uL   Retic Count, Absolute 43.3 19.0 - 186.0 K/uL   Immature Retic Fract 16.7 (H) 2.3 - 15.9 %   Reticulocyte Hemoglobin 37.2 >27.9 pg  Ferritin     Status: None   Collection Time: 11/02/21  9:54 AM  Result Value Ref Range   Ferritin 211 24 - 336 ng/mL     No results found.  ROS:  As stated above in the HPI otherwise negative.  Blood pressure (!) 197/59, pulse 71, temperature 98.4 F (36.9 C), resp. rate 20, height '5\' 6"'$  (1.676 m), weight 69.4 kg, SpO2 100 %.    PE: Gen: NAD, Alert and Oriented HEENT:  [place]/AT,  EOMI Neck: Supple, no LAD Lungs: CTA Bilaterally CV: RRR without M/G/R ABD: Soft, NTND, +BS Ext: No C/C/E  Assessment/Plan: 1) Personal history of polyps - colonoscopy.  Laraine Samet D 11/03/2021, 9:34 AM

## 2021-11-03 NOTE — Discharge Instructions (Signed)

## 2021-11-03 NOTE — Op Note (Signed)
[facility] Patient Name: Caleb [name] Procedure Date: 11/03/2021 MRN: [id number] Attending MD: Carol Ada , MD Date of Birth: December 19, 1950 CSN: [id number] Age: 71 Admit Type: Outpatient Procedure:                Colonoscopy Indications:              High risk colon cancer surveillance: Personal                            history of colonic polyps Providers:                Carol Ada, MD, Jeanella Cara, RN, [facility] Technician, Technician Referring MD:              Medicines:                 Complications:            No immediate complications. Estimated Blood Loss:     Estimated blood loss: none. Procedure:                Pre-Anesthesia Assessment:                           - Prior to the procedure, a History and Physical                            was performed, and patient medications and                            allergies were reviewed. The patient's tolerance of                            previous anesthesia was also reviewed. The risks                            and benefits of the procedure and the sedation                            options and risks were discussed with the patient.                            All questions were answered, and informed consent                            was obtained. Prior Anticoagulants: The patient has                            taken no previous anticoagulant or antiplatelet                            agents. ASA Grade Assessment: III - A patient with                            severe systemic disease.  After reviewing the risks                            and benefits, the patient was deemed in                            satisfactory condition to undergo the procedure.                           - Sedation was administered by an anesthesia                            professional. Deep sedation was attained.                           After obtaining informed consent, the colonoscope                             was passed under direct vision. Throughout the                            procedure, the patient's blood pressure, pulse, and                            oxygen saturations were monitored continuously. The                            PCF-HQ190L ([id number] Olympus colonoscope was                            introduced through the anus and advanced to the the                            cecum, identified by appendiceal orifice and                            ileocecal valve. The colonoscopy was performed                            without difficulty. The patient tolerated the                            procedure well. The quality of the bowel                            preparation was evaluated using the BBPS [facility]                            Bowel Preparation Scale) with scores of: Right                            Colon = 2 (minor amount of residual staining, small  fragments of stool and/or opaque liquid, but mucosa                            seen well), Transverse Colon = 2 (minor amount of                            residual staining, small fragments of stool and/or                            opaque liquid, but mucosa seen well) and Left Colon                            = 2 (minor amount of residual staining, small                            fragments of stool and/or opaque liquid, but mucosa                            seen well). The total BBPS score equals 6. The                            quality of the bowel preparation was good. The                            ileocecal valve, appendiceal orifice, and rectum                            were photographed. Scope In: 9:57:31 AM Scope Out: 10:19:21 AM Scope Withdrawal Time: 0 hours 14 minutes 21 seconds  Total Procedure Duration: 0 hours 21 minutes 50 seconds  Findings:      Three sessile polyps were found in the transverse colon and ascending       colon. The polyps were 2 to 3 mm in size. These  polyps were removed with       a cold snare. Resection and retrieval were complete.      A small post polypectomy scar was found in the transverse colon and in       the ascending colon. There was residual polypoid tissue.      A tattoo was seen in the descending colon. The tattoo site appeared       normal.      The residual polypoid tissue may be a reflection of the scar itself,       i.e., puckering of the surrounding mucosa. The mucosa was resected with       a cold snare for further investigation. Impression:               - Three 2 to 3 mm polyps in the transverse colon                            and in the ascending colon, removed with a cold                            snare. Resected and retrieved.                           -  Post-polypectomy scars in the transverse colon                            and in the ascending colon.                           - A tattoo was seen in the descending colon. The                            tattoo site appeared normal. Moderate Sedation:      Not Applicable - Patient had care per Anesthesia. Recommendation:           - Patient has a contact number available for                            emergencies. The signs and symptoms of potential                            delayed complications were discussed with the                            patient. Return to normal activities tomorrow.                            Written discharge instructions were provided to the                            patient.                           - Resume previous diet.                           - Continue present medications.                           - Await pathology results.                           - Repeat colonoscopy in 3 years for surveillance. Procedure Code(s):        --- Professional ---                           [phone], Colonoscopy, flexible; with removal of                            tumor(s), polyp(s), or other lesion(s) by snare                             technique Diagnosis Code(s):        --- Professional ---                           K63.5, Polyp of colon  Z86.010, Personal history of colonic polyps                           Z98.890, Other specified postprocedural states CPT copyright 2019 [employer]. All rights reserved. The codes documented in this report are preliminary and upon coder review may  be revised to meet current compliance requirements. Carol Ada, MD Carol Ada, MD 11/03/2021 10:27:59 AM This report has been signed electronically. Number of Addenda: 0

## 2021-11-03 NOTE — Anesthesia Postprocedure Evaluation (Signed)
Anesthesia Post Note  Patient: Caleb [name]  Procedure(s) Performed: COLONOSCOPY WITH PROPOFOL POLYPECTOMY     Patient location during evaluation: PACU Anesthesia Type: MAC Level of consciousness: awake and alert Pain management: pain level controlled Vital Signs Assessment: post-procedure vital signs reviewed and stable Respiratory status: spontaneous breathing, nonlabored ventilation and respiratory function stable Cardiovascular status: blood pressure returned to baseline and stable Postop Assessment: no apparent nausea or vomiting Anesthetic complications: no Comments: D/w patient to take all BP meds when he gets home   No notable events documented.  Last Vitals:  Vitals:   11/03/21 1050 11/03/21 1055  BP: (!) 201/67 (!) 201/60  Pulse: 66   Resp: 18   Temp:    SpO2: 99%     Last Pain:  Vitals:   11/03/21 1050  TempSrc:   PainSc: 0-No pain                 Pervis Hocking

## 2021-11-03 NOTE — Transfer of Care (Signed)
Immediate Anesthesia Transfer of Care Note  Patient: Caleb [name]  Procedure(s) Performed: COLONOSCOPY WITH PROPOFOL POLYPECTOMY  Patient Location: PACU  Anesthesia Type:MAC  Level of Consciousness: awake, alert  and oriented  Airway & Oxygen Therapy: Patient Spontanous Breathing and Patient connected to face mask oxygen  Post-op Assessment: Report given to RN and Post -op Vital signs reviewed and stable  Post vital signs: Reviewed and stable  Last Vitals:  Vitals Value Taken Time  BP    Temp    Pulse    Resp 23 11/03/21 1027  SpO2    Vitals shown include unvalidated device data.  Last Pain:  Vitals:   11/03/21 0923  TempSrc: Oral  PainSc: 0-No pain         Complications: No notable events documented.

## 2021-11-05 LAB — METHYLMALONIC ACID, SERUM: Methylmalonic Acid, Quantitative: 138 nmol/L (ref 0–378)

## 2021-11-06 ENCOUNTER — Encounter (HOSPITAL_COMMUNITY): Payer: Self-pay | Admitting: Gastroenterology

## 2021-11-06 LAB — UPEP/UIFE/LIGHT CHAINS/TP, 24-HR UR
% BETA, Urine: 39.5 %
ALPHA 1 URINE: 1 %
Albumin, U: 39.7 %
Alpha 2, Urine: 6.6 %
Free Kappa Lt Chains,Ur: 36.62 mg/L (ref 1.17–86.46)
Free Kappa/Lambda Ratio: 7.4 (ref 1.83–14.26)
Free Lambda Lt Chains,Ur: 4.95 mg/L (ref 0.27–15.21)
GAMMA GLOBULIN URINE: 13.2 %
Total Protein, Urine-Ur/day: 43 mg/24 hr (ref 30–150)
Total Protein, Urine: 14.2 mg/dL
Total Volume: 300

## 2021-11-09 LAB — SURGICAL PATHOLOGY

## 2021-11-13 ENCOUNTER — Ambulatory Visit (HOSPITAL_COMMUNITY)
Admission: RE | Admit: 2021-11-13 | Discharge: 2021-11-13 | Disposition: A | Discharge status: Home / Self Care | Payer: Medicare HMO | Source: Ambulatory Visit | Attending: Internal Medicine | Admitting: Internal Medicine

## 2021-11-13 DIAGNOSIS — I701 Atherosclerosis of renal artery: Secondary | ICD-10-CM | POA: Diagnosis not present

## 2021-11-27 ENCOUNTER — Encounter (HOSPITAL_BASED_OUTPATIENT_CLINIC_OR_DEPARTMENT_OTHER): Payer: Self-pay | Admitting: Pediatrics

## 2021-11-27 ENCOUNTER — Emergency Department (HOSPITAL_BASED_OUTPATIENT_CLINIC_OR_DEPARTMENT_OTHER)
Admission: EM | Admit: 2021-11-27 | Discharge: 2021-11-27 | Disposition: A | Discharge status: Home / Self Care | Payer: Medicare HMO | Attending: Emergency Medicine | Admitting: Emergency Medicine

## 2021-11-27 ENCOUNTER — Other Ambulatory Visit: Payer: Self-pay

## 2021-11-27 DIAGNOSIS — Z7901 Long term (current) use of anticoagulants: Secondary | ICD-10-CM | POA: Insufficient documentation

## 2021-11-27 DIAGNOSIS — Z7982 Long term (current) use of aspirin: Secondary | ICD-10-CM | POA: Insufficient documentation

## 2021-11-27 DIAGNOSIS — R339 Retention of urine, unspecified: Secondary | ICD-10-CM | POA: Insufficient documentation

## 2021-11-27 LAB — CBC WITH DIFFERENTIAL/PLATELET
Abs Immature Granulocytes: 0.02 10*3/uL (ref 0.00–0.07)
Basophils Absolute: 0.1 10*3/uL (ref 0.0–0.1)
Basophils Relative: 1 %
Eosinophils Absolute: 0.1 10*3/uL (ref 0.0–0.5)
Eosinophils Relative: 2 %
HCT: 37.5 % — ABNORMAL LOW (ref 39.0–52.0)
Hemoglobin: 12.6 g/dL — ABNORMAL LOW (ref 13.0–17.0)
Immature Granulocytes: 0 %
Lymphocytes Relative: 10 %
Lymphs Abs: 0.8 10*3/uL (ref 0.7–4.0)
MCH: 34.1 pg — ABNORMAL HIGH (ref 26.0–34.0)
MCHC: 33.6 g/dL (ref 30.0–36.0)
MCV: 101.4 fL — ABNORMAL HIGH (ref 80.0–100.0)
Monocytes Absolute: 0.6 10*3/uL (ref 0.1–1.0)
Monocytes Relative: 7 %
Neutro Abs: 6.4 10*3/uL (ref 1.7–7.7)
Neutrophils Relative %: 80 %
Platelets: 208 10*3/uL (ref 150–400)
RBC: 3.7 MIL/uL — ABNORMAL LOW (ref 4.22–5.81)
RDW: 12.5 % (ref 11.5–15.5)
WBC: 8 10*3/uL (ref 4.0–10.5)
nRBC: 0 % (ref 0.0–0.2)

## 2021-11-27 LAB — URINALYSIS, ROUTINE W REFLEX MICROSCOPIC
Bilirubin Urine: NEGATIVE
Glucose, UA: NEGATIVE mg/dL
Ketones, ur: NEGATIVE mg/dL
Leukocytes,Ua: NEGATIVE
Nitrite: NEGATIVE
Protein, ur: 30 mg/dL — AB
Specific Gravity, Urine: 1.02 (ref 1.005–1.030)
pH: 7.5 (ref 5.0–8.0)

## 2021-11-27 LAB — URINALYSIS, MICROSCOPIC (REFLEX): WBC, UA: >50 WBC/hpf (ref 0–5)

## 2021-11-27 LAB — BASIC METABOLIC PANEL
Anion gap: 8 (ref 5–15)
BUN: 9 mg/dL (ref 8–23)
CO2: 27 mmol/L (ref 22–32)
Calcium: 8.8 mg/dL — ABNORMAL LOW (ref 8.9–10.3)
Chloride: 98 mmol/L (ref 98–111)
Creatinine, Ser: 0.69 mg/dL (ref 0.61–1.24)
GFR, Estimated: >60 mL/min (ref >60)
Glucose, Bld: 104 mg/dL — ABNORMAL HIGH (ref 70–99)
Potassium: 4.4 mmol/L (ref 3.5–5.1)
Sodium: 133 mmol/L — ABNORMAL LOW (ref 135–145)

## 2021-11-27 MED ORDER — SODIUM CHLORIDE 0.9 % IV BOLUS
1000.0000 mL | Freq: Once | INTRAVENOUS | Status: AC
  Administered 2021-11-27: 1000 mL via INTRAVENOUS

## 2021-11-27 NOTE — ED Notes (Signed)
Pt was incontinent of large amt of urine, wet thru his depends , sweat pants and puddled on bed, pt cleaned and changed in new pants blue scrub and diaper

## 2021-11-27 NOTE — ED Notes (Signed)
Repeat Bladder Scan was 380m.

## 2021-11-27 NOTE — ED Notes (Signed)
Foley emptied of 600 cc clear urine orior to d/c , daughter given instructions on how to empty and how to apply leg bag , daughter states understanding

## 2021-11-27 NOTE — Discharge Instructions (Signed)
You were unable to fully empty your bladder.  I placed a Foley catheter.  Please call the urologist to follow-up with them in the office.  They will see you in about a week typically and pull out the catheter and see if you can urinate on your own.  Please let your family doctor know.  They may want to change some of your home medications.

## 2021-11-27 NOTE — ED Notes (Signed)
Bladder scan was 268m

## 2021-11-27 NOTE — ED Provider Notes (Signed)
[name]PART[facility] Provider Note   CSN: [id number] Arrival date & time: 11/27/21  1159     History  Chief Complaint  Patient presents with   Painful Urination    Caleb [name] is a 71 y.o. male.  71 yo M with a chief complaint of difficulty urinating.  Says since last night he really has not had much urinary output.  Is felt like he needs to go but has not really been able to produce any.  He went to urgent care today and was sent here to be evaluated with a bladder scan and possibly Foley catheter placement.  Patient tells me that since he went to urgent care he placed some warm rags about his genitals and was able to get out a small amount of urine.  He does not feel a terrible urge to go.  He denies any recent medication changes.  Denies abdominal pain.  Denies fevers.        Home Medications Prior to Admission medications   Medication Sig Start Date End Date Taking? Authorizing Provider  acetaminophen (TYLENOL) 500 MG tablet Take 1,000 mg by mouth 2 (two) times daily as needed for mild pain or moderate pain.     [provider]  albuterol (VENTOLIN HFA) 108 (90 Base) MCG/ACT inhaler Inhale 2 puffs into the lungs every 6 (six) hours as needed for wheezing or shortness of breath. 07/24/18   Laurin Coder, MD  alendronate (FOSAMAX) 70 MG tablet Take 70 mg by mouth every Saturday.    [provider]  amLODipine (NORVASC) 5 MG tablet Take 5 mg by mouth daily.    [provider]  aspirin EC 81 MG tablet Take 81 mg by mouth daily. Swallow whole.    [provider]  atorvastatin (LIPITOR) 80 MG tablet Take 1 tablet (80 mg total) by mouth daily. 12/14/15   Josue Hector, MD  carvedilol (COREG) 25 MG tablet Take 25 mg by mouth 2 (two) times daily with a meal.    [provider]  Cholecalciferol (VITAMIN D-3) 25 MCG (1000 UT) CAPS Take 1,000 Units by mouth daily with breakfast.    [provider]  ferrous  sulfate 325 (65 FE) MG tablet Take 325 mg by mouth daily with breakfast.    [provider]  fexofenadine (ALLEGRA) 180 MG tablet Take 180 mg by mouth daily.    [provider]  folic acid (FOLVITE) 1 MG tablet Take 1 mg by mouth daily. 08/01/20   [provider]  irbesartan (AVAPRO) 300 MG tablet Take 300 mg by mouth daily.    [provider]  nitroGLYCERIN (NITROSTAT) 0.4 MG SL tablet Place 0.4 mg under the tongue every 5 (five) minutes as needed for chest pain.    [provider]  Olopatadine HCl 0.2 % SOLN Place 1 drop into both eyes daily as needed (allergies).    [provider]  pantoprazole (PROTONIX) 40 MG tablet Take 40 mg by mouth daily. 04/16/20   [provider]  pregabalin (LYRICA) 75 MG capsule Take 75 mg by mouth 3 (three) times daily. 01/23/21   [provider]  rivaroxaban (XARELTO) 2.5 MG TABS tablet Take 2.5 mg by mouth 2 (two) times daily.    [provider]  thiamine (VITAMIN B1) 100 MG tablet Take 100 mg by mouth daily.    [provider]  vitamin B-12 (CYANOCOBALAMIN) 1000 MCG tablet Take 1,000 mcg by mouth daily with breakfast.  [provider]      Allergies    Bactrim [sulfamethoxazole-trimethoprim], Lisinopril, Losartan, and Pletal [cilostazol]    Review of Systems   Review of Systems  Physical Exam Updated Vital Signs BP (!) 187/68   Pulse 68   Temp 98.6 F (37 C) (Oral)   Resp 16   Ht '5\' 3"'$  (1.6 m)   Wt 68.5 kg   SpO2 97%   BMI 26.75 kg/m  Physical Exam Vitals and nursing note reviewed.  Constitutional:      Appearance: He is well-developed.  HENT:     Head: Normocephalic and atraumatic.  Eyes:     Pupils: Pupils are equal, round, and reactive to light.  Neck:     Vascular: No JVD.  Cardiovascular:     Rate and Rhythm: Normal rate and regular rhythm.     Heart sounds: No murmur heard.    No friction rub. No gallop.  Pulmonary:     Effort: No  respiratory distress.     Breath sounds: No wheezing.  Abdominal:     General: There is no distension.     Tenderness: There is no abdominal tenderness. There is no guarding or rebound.  Musculoskeletal:        General: Normal range of motion.     Cervical back: Normal range of motion and neck supple.  Skin:    Coloration: Skin is not pale.     Findings: No rash.  Neurological:     Mental Status: He is alert and oriented to person, place, and time.  Psychiatric:        Behavior: Behavior normal.     ED Results / Procedures / Treatments   Labs (all labs ordered are listed, but only abnormal results are displayed) Labs Reviewed  URINALYSIS, ROUTINE W REFLEX MICROSCOPIC - Abnormal; Notable for the following components:      Result Value   APPearance HAZY (*)    Hgb urine dipstick TRACE (*)    Protein, ur 30 (*)    All other components within normal limits  CBC WITH DIFFERENTIAL/PLATELET - Abnormal; Notable for the following components:   RBC 3.70 (*)    Hemoglobin 12.6 (*)    HCT 37.5 (*)    MCV 101.4 (*)    MCH 34.1 (*)    All other components within normal limits  BASIC METABOLIC PANEL - Abnormal; Notable for the following components:   Sodium 133 (*)    Glucose, Bld 104 (*)    Calcium 8.8 (*)    All other components within normal limits  URINALYSIS, MICROSCOPIC (REFLEX) - Abnormal; Notable for the following components:   Bacteria, UA RARE (*)    All other components within normal limits    EKG None  Radiology No results found.  Procedures Procedures    Medications Ordered in ED Medications  sodium chloride 0.9 % bolus 1,000 mL (1,000 mLs Intravenous New Bag/Given 11/27/21 1433)    ED Course/ Medical Decision Making/ A&P                           Medical Decision Making Amount and/or Complexity of Data Reviewed Labs: ordered.   71 yo M with a chief complaints of difficulty urinating.  This is been going on for about 12 hours or so.  He went to urgent  care and there is some concern for acute urinary tension and sent here for evaluation.  Patient appears very comfortable  for acute urinary tension.  I do not appreciate any significant bladder tenderness on exam.  With decreased urinary output we will obtain a bladder scan.  Bolus of IV fluids.  Check renal function.  Reassess.  Patient without change to his renal function.  Patient's anemia at baseline.  Had about 290 cc of urine in his bladder on initial bladder scan.  Tells me he has been able to urinate since then.  Repeat bladder scan with him immediately post void was greater than 300.  Will place Foley.  Give urology follow-up.  3:24 PM:  I have discussed the diagnosis/risks/treatment options with the patient and family.  Evaluation and diagnostic testing in the emergency department does not suggest an emergent condition requiring admission or immediate intervention beyond what has been performed at this time.  They will follow up with Urology. We also discussed returning to the ED immediately if new or worsening sx occur. We discussed the sx which are most concerning (e.g., sudden worsening pain, fever, inability to tolerate by mouth) that necessitate immediate return. Medications administered to the patient during their visit and any new prescriptions provided to the patient are listed below.  Medications given during this visit Medications  sodium chloride 0.9 % bolus 1,000 mL (1,000 mLs Intravenous New Bag/Given 11/27/21 1433)     The patient appears reasonably screen and/or stabilized for discharge and I doubt any other medical condition or other [facility] requiring further screening, evaluation, or treatment in the ED at this time prior to discharge.          Final Clinical Impression(s) / ED Diagnoses Final diagnoses:  Urinary retention    Rx / DC Orders ED Discharge Orders     None         Deno Etienne, DO 11/27/21 1524

## 2021-11-27 NOTE — ED Triage Notes (Signed)
C/O of painful urination started at 5am;

## 2022-01-12 ENCOUNTER — Observation Stay (HOSPITAL_COMMUNITY)
Admission: EM | Admit: 2022-01-12 | Discharge: 2022-01-20 | Disposition: A | Discharge status: Home / Self Care | Payer: Medicare HMO | Attending: Internal Medicine | Admitting: Internal Medicine

## 2022-01-12 ENCOUNTER — Emergency Department (HOSPITAL_COMMUNITY): Payer: Medicare HMO

## 2022-01-12 ENCOUNTER — Encounter (HOSPITAL_COMMUNITY): Payer: Self-pay

## 2022-01-12 ENCOUNTER — Other Ambulatory Visit: Payer: Self-pay

## 2022-01-12 DIAGNOSIS — Z1152 Encounter for screening for COVID-19: Secondary | ICD-10-CM | POA: Insufficient documentation

## 2022-01-12 DIAGNOSIS — M8660 Other chronic osteomyelitis, unspecified site: Secondary | ICD-10-CM | POA: Diagnosis not present

## 2022-01-12 DIAGNOSIS — I5022 Chronic systolic (congestive) heart failure: Secondary | ICD-10-CM

## 2022-01-12 DIAGNOSIS — I110 Hypertensive heart disease with heart failure: Secondary | ICD-10-CM | POA: Diagnosis not present

## 2022-01-12 DIAGNOSIS — I5020 Unspecified systolic (congestive) heart failure: Secondary | ICD-10-CM | POA: Diagnosis present

## 2022-01-12 DIAGNOSIS — L89229 Pressure ulcer of left hip, unspecified stage: Secondary | ICD-10-CM

## 2022-01-12 DIAGNOSIS — Z79899 Other long term (current) drug therapy: Secondary | ICD-10-CM | POA: Diagnosis not present

## 2022-01-12 DIAGNOSIS — N3000 Acute cystitis without hematuria: Secondary | ICD-10-CM | POA: Diagnosis not present

## 2022-01-12 DIAGNOSIS — R7881 Bacteremia: Secondary | ICD-10-CM | POA: Diagnosis not present

## 2022-01-12 DIAGNOSIS — W1830XA Fall on same level, unspecified, initial encounter: Secondary | ICD-10-CM | POA: Insufficient documentation

## 2022-01-12 DIAGNOSIS — M869 Osteomyelitis, unspecified: Secondary | ICD-10-CM | POA: Insufficient documentation

## 2022-01-12 DIAGNOSIS — Y999 Unspecified external cause status: Secondary | ICD-10-CM | POA: Diagnosis not present

## 2022-01-12 DIAGNOSIS — S0990XA Unspecified injury of head, initial encounter: Secondary | ICD-10-CM | POA: Insufficient documentation

## 2022-01-12 DIAGNOSIS — J449 Chronic obstructive pulmonary disease, unspecified: Secondary | ICD-10-CM

## 2022-01-12 DIAGNOSIS — I5032 Chronic diastolic (congestive) heart failure: Secondary | ICD-10-CM | POA: Diagnosis not present

## 2022-01-12 DIAGNOSIS — R531 Weakness: Principal | ICD-10-CM

## 2022-01-12 DIAGNOSIS — I10 Essential (primary) hypertension: Secondary | ICD-10-CM | POA: Diagnosis present

## 2022-01-12 DIAGNOSIS — M4802 Spinal stenosis, cervical region: Secondary | ICD-10-CM

## 2022-01-12 DIAGNOSIS — Y939 Activity, unspecified: Secondary | ICD-10-CM | POA: Diagnosis not present

## 2022-01-12 DIAGNOSIS — E559 Vitamin D deficiency, unspecified: Secondary | ICD-10-CM | POA: Insufficient documentation

## 2022-01-12 DIAGNOSIS — E222 Syndrome of inappropriate secretion of antidiuretic hormone: Secondary | ICD-10-CM

## 2022-01-12 DIAGNOSIS — Y929 Unspecified place or not applicable: Secondary | ICD-10-CM | POA: Diagnosis not present

## 2022-01-12 DIAGNOSIS — I2510 Atherosclerotic heart disease of native coronary artery without angina pectoris: Secondary | ICD-10-CM

## 2022-01-12 LAB — COMPREHENSIVE METABOLIC PANEL
ALT: 9 U/L (ref 0–44)
AST: 15 U/L (ref 15–41)
Albumin: 2.9 g/dL — ABNORMAL LOW (ref 3.5–5.0)
Alkaline Phosphatase: 69 U/L (ref 38–126)
Anion gap: 10 (ref 5–15)
BUN: 12 mg/dL (ref 8–23)
CO2: 23 mmol/L (ref 22–32)
Calcium: 8.8 mg/dL — ABNORMAL LOW (ref 8.9–10.3)
Chloride: 96 mmol/L — ABNORMAL LOW (ref 98–111)
Creatinine, Ser: 0.75 mg/dL (ref 0.61–1.24)
GFR, Estimated: >60 mL/min (ref >60)
Glucose, Bld: 106 mg/dL — ABNORMAL HIGH (ref 70–99)
Potassium: 4.8 mmol/L (ref 3.5–5.1)
Sodium: 129 mmol/L — ABNORMAL LOW (ref 135–145)
Total Bilirubin: 0.8 mg/dL (ref 0.3–1.2)
Total Protein: 7.1 g/dL (ref 6.5–8.1)

## 2022-01-12 LAB — CBC WITH DIFFERENTIAL/PLATELET
Abs Immature Granulocytes: 0.07 10*3/uL (ref 0.00–0.07)
Basophils Absolute: 0 10*3/uL (ref 0.0–0.1)
Basophils Relative: 0 %
Eosinophils Absolute: 0 10*3/uL (ref 0.0–0.5)
Eosinophils Relative: 0 %
HCT: 33.8 % — ABNORMAL LOW (ref 39.0–52.0)
Hemoglobin: 11.5 g/dL — ABNORMAL LOW (ref 13.0–17.0)
Immature Granulocytes: 1 %
Lymphocytes Relative: 4 %
Lymphs Abs: 0.5 10*3/uL — ABNORMAL LOW (ref 0.7–4.0)
MCH: 33.1 pg (ref 26.0–34.0)
MCHC: 34 g/dL (ref 30.0–36.0)
MCV: 97.4 fL (ref 80.0–100.0)
Monocytes Absolute: 0.8 10*3/uL (ref 0.1–1.0)
Monocytes Relative: 7 %
Neutro Abs: 11.3 10*3/uL — ABNORMAL HIGH (ref 1.7–7.7)
Neutrophils Relative %: 88 %
Platelets: 207 10*3/uL (ref 150–400)
RBC: 3.47 MIL/uL — ABNORMAL LOW (ref 4.22–5.81)
RDW: 13 % (ref 11.5–15.5)
WBC: 12.7 10*3/uL — ABNORMAL HIGH (ref 4.0–10.5)
nRBC: 0 % (ref 0.0–0.2)

## 2022-01-12 LAB — URINALYSIS, ROUTINE W REFLEX MICROSCOPIC
Bilirubin Urine: NEGATIVE
Glucose, UA: NEGATIVE mg/dL
Ketones, ur: 5 mg/dL — AB
Nitrite: NEGATIVE
Protein, ur: NEGATIVE mg/dL
Specific Gravity, Urine: 1.01 (ref 1.005–1.030)
pH: 5 (ref 5.0–8.0)

## 2022-01-12 LAB — RESP PANEL BY RT-PCR (RSV, FLU A&B, COVID)  RVPGX2
Influenza A by PCR: NEGATIVE
Influenza B by PCR: NEGATIVE
Resp Syncytial Virus by PCR: NEGATIVE
SARS Coronavirus 2 by RT PCR: NEGATIVE

## 2022-01-12 LAB — AMMONIA: Ammonia: 20 umol/L (ref 9–35)

## 2022-01-12 LAB — PHOSPHORUS: Phosphorus: 2.7 mg/dL (ref 2.5–4.6)

## 2022-01-12 LAB — CK: Total CK: 47 U/L — ABNORMAL LOW (ref 49–397)

## 2022-01-12 LAB — LIPASE, BLOOD: Lipase: 24 U/L (ref 11–51)

## 2022-01-12 LAB — BRAIN NATRIURETIC PEPTIDE: B Natriuretic Peptide: 492 pg/mL — ABNORMAL HIGH (ref 0.0–100.0)

## 2022-01-12 LAB — TSH: TSH: 1.412 u[IU]/mL (ref 0.350–4.500)

## 2022-01-12 LAB — MAGNESIUM: Magnesium: 1.7 mg/dL (ref 1.7–2.4)

## 2022-01-12 LAB — TROPONIN I (HIGH SENSITIVITY)
Troponin I (High Sensitivity): 6 ng/L (ref <18)
Troponin I (High Sensitivity): 6 ng/L (ref <18)

## 2022-01-12 LAB — LACTIC ACID, PLASMA: Lactic Acid, Venous: 1 mmol/L (ref 0.5–1.9)

## 2022-01-12 MED ORDER — ATORVASTATIN CALCIUM 40 MG PO TABS
80.0000 mg | ORAL_TABLET | Freq: Every day | ORAL | Status: DC
  Administered 2022-01-13 – 2022-01-20 (×8): 80 mg via ORAL
  Filled 2022-01-12 (×8): qty 2

## 2022-01-12 MED ORDER — ONDANSETRON HCL 4 MG/2ML IJ SOLN: 4.0000 mg | Freq: Four times a day (QID) | INTRAMUSCULAR | Status: DC | PRN

## 2022-01-12 MED ORDER — PREGABALIN 75 MG PO CAPS
75.0000 mg | ORAL_CAPSULE | Freq: Three times a day (TID) | ORAL | Status: DC
  Administered 2022-01-12 – 2022-01-20 (×23): 75 mg via ORAL
  Filled 2022-01-12 (×23): qty 1

## 2022-01-12 MED ORDER — HYDROCODONE-ACETAMINOPHEN 5-325 MG PO TABS
1.0000 | ORAL_TABLET | ORAL | Status: DC | PRN
  Administered 2022-01-15: 1 via ORAL
  Filled 2022-01-12: qty 1

## 2022-01-12 MED ORDER — POLYETHYLENE GLYCOL 3350 17 G PO PACK
17.0000 g | PACK | Freq: Every day | ORAL | Status: DC | PRN
  Administered 2022-01-17: 17 g via ORAL
  Filled 2022-01-12 (×2): qty 1

## 2022-01-12 MED ORDER — ASPIRIN 81 MG PO TBEC
81.0000 mg | DELAYED_RELEASE_TABLET | Freq: Every day | ORAL | Status: DC
  Administered 2022-01-13 – 2022-01-20 (×8): 81 mg via ORAL
  Filled 2022-01-12 (×8): qty 1

## 2022-01-12 MED ORDER — ONDANSETRON HCL 4 MG PO TABS: 4.0000 mg | ORAL_TABLET | Freq: Four times a day (QID) | ORAL | Status: DC | PRN

## 2022-01-12 MED ORDER — SODIUM CHLORIDE 0.9 % IV SOLN: 1.0000 g | INTRAVENOUS | Status: DC

## 2022-01-12 MED ORDER — RIVAROXABAN 2.5 MG PO TABS
10.0000 mg | ORAL_TABLET | Freq: Every day | ORAL | Status: DC
  Filled 2022-01-12: qty 4

## 2022-01-12 MED ORDER — ALBUTEROL SULFATE (2.5 MG/3ML) 0.083% IN NEBU: 3.0000 mL | INHALATION_SOLUTION | Freq: Four times a day (QID) | RESPIRATORY_TRACT | Status: DC | PRN

## 2022-01-12 MED ORDER — RIVAROXABAN 2.5 MG PO TABS
2.5000 mg | ORAL_TABLET | Freq: Two times a day (BID) | ORAL | Status: DC
  Filled 2022-01-12: qty 1

## 2022-01-12 MED ORDER — ACETAMINOPHEN 325 MG PO TABS: 650.0000 mg | ORAL_TABLET | Freq: Four times a day (QID) | ORAL | Status: DC | PRN

## 2022-01-12 MED ORDER — RIVAROXABAN 10 MG PO TABS
10.0000 mg | ORAL_TABLET | Freq: Every day | ORAL | Status: DC
  Administered 2022-01-12 – 2022-01-14 (×3): 10 mg via ORAL
  Filled 2022-01-12 (×3): qty 1

## 2022-01-12 MED ORDER — SODIUM CHLORIDE 1 G PO TABS
1.0000 g | ORAL_TABLET | Freq: Two times a day (BID) | ORAL | Status: DC
  Administered 2022-01-12 – 2022-01-20 (×16): 1 g via ORAL
  Filled 2022-01-12 (×18): qty 1

## 2022-01-12 MED ORDER — IRBESARTAN 150 MG PO TABS
300.0000 mg | ORAL_TABLET | Freq: Every day | ORAL | Status: DC
  Administered 2022-01-13 – 2022-01-20 (×8): 300 mg via ORAL
  Filled 2022-01-12: qty 2
  Filled 2022-01-12: qty 1
  Filled 2022-01-12 (×6): qty 2

## 2022-01-12 MED ORDER — PANTOPRAZOLE SODIUM 40 MG PO TBEC
40.0000 mg | DELAYED_RELEASE_TABLET | Freq: Every day | ORAL | Status: DC
  Administered 2022-01-13 – 2022-01-20 (×8): 40 mg via ORAL
  Filled 2022-01-12 (×8): qty 1

## 2022-01-12 MED ORDER — ENOXAPARIN SODIUM 40 MG/0.4ML IJ SOSY: 40.0000 mg | PREFILLED_SYRINGE | INTRAMUSCULAR | Status: DC

## 2022-01-12 MED ORDER — AMLODIPINE BESYLATE 5 MG PO TABS
5.0000 mg | ORAL_TABLET | Freq: Every day | ORAL | Status: DC
  Administered 2022-01-13 – 2022-01-20 (×8): 5 mg via ORAL
  Filled 2022-01-12 (×8): qty 1

## 2022-01-12 MED ORDER — ACETAMINOPHEN 650 MG RE SUPP: 650.0000 mg | Freq: Four times a day (QID) | RECTAL | Status: DC | PRN

## 2022-01-12 MED ORDER — SODIUM CHLORIDE 0.9 % IV SOLN
1.0000 g | Freq: Once | INTRAVENOUS | Status: AC
  Administered 2022-01-12: 1 g via INTRAVENOUS
  Filled 2022-01-12: qty 10

## 2022-01-12 MED ORDER — CARVEDILOL 25 MG PO TABS
25.0000 mg | ORAL_TABLET | Freq: Two times a day (BID) | ORAL | Status: DC
  Administered 2022-01-13 – 2022-01-20 (×15): 25 mg via ORAL
  Filled 2022-01-12 (×11): qty 1
  Filled 2022-01-12: qty 2
  Filled 2022-01-12 (×3): qty 1

## 2022-01-12 NOTE — ED Provider Notes (Signed)
[facility] DEPT Provider Note  CSN: [id number] Arrival date & time: 01/12/22 1414  Chief Complaint(s) Fall  HPI Caleb [name] is a 71 y.o. male with past medical history as below, significant for COPD, HTN, HLD, CABG, left hip trochanteric ulcer  who presents to the ED with complaint of weakness. Pt reports he is scheduled for surgery upcoming to his left hip at [facility]. He reports increased fatigue over last week or so, he uses a walker at baseline. He did report while getting into bed last night he slipped and fell to the ground, hit his head but no LOC. He was able to get back up but had difficulty getting out of bed this morning, no further falls, no fevers/chills/nausea/vomiting. Has noticed some increased bleeding from his left hip wound, slightly increased discomfort to that area. Some dysuria.   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   LIMA to LAD, RIMA to RCA, SVG to OM1, EVH via right thigh   Tobacco abuse    Patient Active Problem List   Diagnosis Date Noted   Malnutrition of moderate degree 09/09/2020   Hyponatremia 09/05/2020   Spinal stenosis of cervical region 04/21/2020   Monoclonal gammopathy 04/13/2020   SIRS (systemic inflammatory response syndrome) ([place]) 08/07/2019   Demand ischemia    Gastritis with hemorrhage 02/03/2019   PVD (peripheral vascular disease) ([place]) 02/03/2019   Chronic anticoagulation 02/03/2019   Acute kidney injury ([place]) 08/14/2018   Iron deficiency anemia 08/14/2018    Acute hyponatremia 08/13/2018   Renal artery stenosis ([place]) 10/22/2017   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 Authorizing Provider  acetaminophen (TYLENOL) 500 MG tablet Take 1,000 mg by mouth 2 (two) times daily as needed for mild pain or moderate pain.     [provider]  albuterol (VENTOLIN HFA) 108 (90 Base) MCG/ACT inhaler Inhale 2 puffs into the lungs every 6 (six) hours as needed for wheezing or shortness of breath. 07/24/18   Laurin Coder, MD  alendronate (FOSAMAX) 70 MG tablet Take 70 mg by mouth every Saturday.    [provider]  amLODipine (NORVASC) 5 MG tablet Take 5 mg by mouth daily.    [provider]  aspirin EC 81 MG tablet Take 81 mg by mouth daily. Swallow whole.    [provider]  atorvastatin (LIPITOR) 80 MG tablet Take 1 tablet (80 mg total) by mouth daily. 12/14/15   Josue Hector, MD  carvedilol (COREG) 25 MG tablet Take 25  mg by mouth 2 (two) times daily with a meal.    [provider]  Cholecalciferol (VITAMIN D-3) 25 MCG (1000 UT) CAPS Take 1,000 Units by mouth daily with breakfast.    [provider]  ferrous sulfate 325 (65 FE) MG tablet Take 325 mg by mouth daily with breakfast.    [provider]  fexofenadine (ALLEGRA) 180 MG tablet Take 180 mg by mouth daily.    [provider]  folic acid (FOLVITE) 1 MG tablet Take 1 mg by mouth daily. 08/01/20   [provider]  irbesartan (AVAPRO) 300 MG tablet Take 300 mg by mouth daily.    [provider]  nitroGLYCERIN (NITROSTAT) 0.4 MG SL tablet Place 0.4 mg under the tongue every 5 (five) minutes as needed for chest pain.    [provider]  Olopatadine HCl 0.2 % SOLN Place 1 drop into both eyes daily as needed (allergies).    [provider]  pantoprazole (PROTONIX) 40 MG tablet Take 40 mg by mouth daily. 04/16/20   [provider]  pregabalin (LYRICA) 75 MG capsule Take 75 mg by mouth 3 (three) times daily. 01/23/21   [provider]  rivaroxaban (XARELTO) 2.5 MG TABS tablet Take 2.5 mg by mouth 2 (two) times daily.    [provider]  thiamine (VITAMIN B1) 100 MG tablet Take 100 mg by mouth daily.    [provider]  vitamin B-12 (CYANOCOBALAMIN) 1000 MCG tablet Take 1,000 mcg by mouth daily with breakfast.    [provider]                                                                                                                                    Past Surgical History Past Surgical History:  Procedure Laterality Date   BIOPSY  10/31/2018   Procedure: BIOPSY;  Surgeon: Carol Ada, MD;  Location: [facility];  Service: Endoscopy;;   cataract surgery  Bilateral 12-2017 and 01-2018   COLONOSCOPY WITH PROPOFOL N/A 10/31/2018   Procedure: COLONOSCOPY WITH PROPOFOL;  Surgeon: Carol Ada, MD;  Location: [facility];  Service: Endoscopy;  Laterality: N/A;   COLONOSCOPY WITH PROPOFOL N/A 09/18/2019   Procedure: COLONOSCOPY WITH PROPOFOL;  Surgeon: Carol Ada, MD;  [facility];  Service: Endoscopy;  Laterality: N/A;   COLONOSCOPY WITH PROPOFOL N/A 01/20/2021   Procedure: COLONOSCOPY WITH PROPOFOL;  Surgeon: Carol Ada, MD;  [facility];  Service: Endoscopy;  Laterality: N/A;   COLONOSCOPY WITH PROPOFOL N/A 11/03/2021   Procedure: COLONOSCOPY WITH PROPOFOL;  Surgeon: Carol Ada, MD;  [facility]: [facility];   Service: Gastroenterology;  Laterality: N/A;   CORONARY ARTERY BYPASS GRAFT N/A 02/17/2014   Procedure: CORONARY ARTERY BYPASS GRAFTING (CABG);  Surgeon: Rexene Alberts, MD;  Location: [place];  Service: Open Heart Surgery;  Laterality: N/A;  Times 3 using bilateral mammary arteries and endoscopically harvested right saphenous vein  ESOPHAGOGASTRODUODENOSCOPY (EGD) WITH PROPOFOL N/A 10/31/2018   Procedure: ESOPHAGOGASTRODUODENOSCOPY (EGD) WITH PROPOFOL;  Surgeon: Carol Ada, MD;  [facility];  Service: Endoscopy;  Laterality: N/A;   ESOPHAGOGASTRODUODENOSCOPY (EGD) WITH PROPOFOL N/A 09/18/2019   Procedure: ESOPHAGOGASTRODUODENOSCOPY (EGD) WITH PROPOFOL;  Surgeon: Carol Ada, MD;  [facility];  Service: Endoscopy;  Laterality: N/A;   FEMORAL ARTERY - FEMORAL ARTERY BYPASS GRAFT      right femoral artery to below-knee popliteal artery bypass with PTFE and right first ray amputation 04/25/17   HEMOSTASIS CLIP PLACEMENT  10/31/2018   Procedure: HEMOSTASIS CLIP PLACEMENT;  Surgeon: Carol Ada, MD;  [facility];  Service: Endoscopy;;   HEMOSTASIS CLIP PLACEMENT  09/18/2019   Procedure: HEMOSTASIS CLIP PLACEMENT;  Surgeon: Carol Ada, MD;  [facility];  Service: Endoscopy;;   HEMOSTASIS CLIP PLACEMENT  01/20/2021   Procedure: HEMOSTASIS CLIP PLACEMENT;  Surgeon: Carol Ada, MD;  [facility];  Service: Endoscopy;;   INTRAOPERATIVE TRANSESOPHAGEAL ECHOCARDIOGRAM N/A 02/17/2014   Procedure: INTRAOPERATIVE TRANSESOPHAGEAL ECHOCARDIOGRAM;  Surgeon: Rexene Alberts, MD;  Location: [place];  Service: Open Heart Surgery;  Laterality: N/A;   LEFT HEART CATHETERIZATION WITH CORONARY ANGIOGRAM N/A 02/12/2014   Procedure: LEFT HEART CATHETERIZATION WITH CORONARY ANGIOGRAM;  Surgeon: Sinclair Grooms, MD;  Location: [facility] CATH [facility];  Service: Cardiovascular;  Laterality: N/A;   POLYPECTOMY  10/31/2018   Procedure: POLYPECTOMY;  Surgeon: Carol Ada, MD;   [facility];  Service: Endoscopy;;   POLYPECTOMY  09/18/2019   Procedure: POLYPECTOMY;  Surgeon: Carol Ada, MD;  [facility];  Service: Endoscopy;;   POLYPECTOMY  01/20/2021   Procedure: POLYPECTOMY;  Surgeon: Carol Ada, MD;  [facility];  Service: Endoscopy;;   POLYPECTOMY  11/03/2021   Procedure: POLYPECTOMY;  Surgeon: Carol Ada, MD;  [facility];  Service: Gastroenterology;;   Clide Deutscher  09/18/2019   Procedure: Clide Deutscher;  Surgeon: Carol Ada, MD;  [facility];  Service: Endoscopy;;   SUBMUCOSAL INJECTION  09/18/2019   Procedure: SUBMUCOSAL INJECTION;  Surgeon: Carol Ada, MD;  [facility];  Service: Endoscopy;;   SUBMUCOSAL LIFTING INJECTION  10/31/2018   Procedure: SUBMUCOSAL LIFTING INJECTION;  Surgeon: Carol Ada, MD;  [facility];  Service: Endoscopy;;   TOE AMPUTATION Right    right great toe    Family History Family History  Problem Relation Age of Onset   Heart attack Mother    Diabetes Mother    Heart attack Brother    Diabetes Brother    Cancer Sister    Stroke Neg Hx     Social History Social History   Tobacco Use   Smoking status: Every Day    Packs/day: 0.50    Years: 35.00    Total pack years: 17.50    Types: Cigarettes    Last attempt to quit: 02/11/2014    Years since quitting: 7.9   Smokeless tobacco: Never   Tobacco comments:    has quit twice before   Vaping Use   Vaping Use: Never used  Substance Use Topics   Alcohol use: Yes    Alcohol/week: 0.0 standard drinks of alcohol    Comment: Six pack a day of beer   Drug use: Never   Allergies Bactrim [sulfamethoxazole-trimethoprim], Lisinopril, Losartan, and Pletal [cilostazol]  Review of Systems Review of Systems  Physical Exam Vital Signs  I have reviewed the triage vital signs BP 139/83   Pulse 77   Temp 97.6 F (36.4 C) (Oral)  Resp 16   SpO2 95%  Physical Exam Vitals and nursing note  reviewed.  Constitutional:      General: He is not in acute distress.    Appearance: He is well-developed.     Comments: pale  HENT:     Head: Normocephalic and atraumatic.     Right Ear: External ear normal.     Left Ear: External ear normal.     Mouth/Throat:     Mouth: Mucous membranes are moist.  Eyes:     General: No scleral icterus. Cardiovascular:     Rate and Rhythm: Normal rate and regular rhythm.     Pulses: Normal pulses.     Heart sounds: Normal heart sounds.  Pulmonary:     Effort: Pulmonary effort is normal. No respiratory distress.     Breath sounds: Normal breath sounds.  Abdominal:     General: Abdomen is flat.     Palpations: Abdomen is soft.     Tenderness: There is no abdominal tenderness.  Musculoskeletal:        General: Normal range of motion.     Cervical back: Normal range of motion.     Right lower leg: Pitting Edema present.     Left lower leg: No edema.       Legs:     Comments: Chronic wound to left hip, scant bleeding. No crepitus.  RLE edema, similar to his baseline per pt, unchanged 2+ DP symmetric b/l  Skin:    General: Skin is warm and dry.     Capillary Refill: Capillary refill takes less than 2 seconds.  Neurological:     Mental Status: He is alert and oriented to person, place, and time.  Psychiatric:        Mood and Affect: Mood normal.        Behavior: Behavior normal.     ED Results and Treatments Labs (all labs ordered are listed, but only abnormal results are displayed) Labs Reviewed  CBC WITH DIFFERENTIAL/PLATELET - Abnormal; Notable for the following components:      Result Value   WBC 12.7 (*)    RBC 3.47 (*)    Hemoglobin 11.5 (*)    HCT 33.8 (*)    Neutro Abs 11.3 (*)    Lymphs Abs 0.5 (*)    All other components within normal limits  COMPREHENSIVE METABOLIC PANEL - Abnormal; Notable for the following components:   Sodium 129 (*)    Chloride 96 (*)    Glucose, Bld 106 (*)    Calcium 8.8 (*)    Albumin 2.9  (*)    All other components within normal limits  URINALYSIS, ROUTINE W REFLEX MICROSCOPIC - Abnormal; Notable for the following components:   APPearance HAZY (*)    Hgb urine dipstick SMALL (*)    Ketones, ur 5 (*)    Leukocytes,Ua LARGE (*)    Bacteria, UA MANY (*)    All other components within normal limits  BRAIN NATRIURETIC PEPTIDE - Abnormal; Notable for the following components:   B Natriuretic Peptide 492.0 (*)    All other components within normal limits  RESP PANEL BY RT-PCR (RSV, FLU A&B, COVID)  RVPGX2  URINE CULTURE  CULTURE, BLOOD (ROUTINE X 2)  CULTURE, BLOOD (ROUTINE X 2)  LIPASE, BLOOD  TSH  T4, FREE  LACTIC ACID, PLASMA  LACTIC ACID, PLASMA  TROPONIN I (HIGH SENSITIVITY)  TROPONIN I (HIGH SENSITIVITY)  Radiology CT Head Wo Contrast  Result Date: 01/12/2022 CLINICAL DATA:  Head trauma, minor (Age >= 65y); Neck trauma (Age >= 65y) EXAM: CT HEAD WITHOUT CONTRAST CT CERVICAL SPINE WITHOUT CONTRAST TECHNIQUE: Multidetector CT imaging of the head and cervical spine was performed following the standard protocol without intravenous contrast. Multiplanar CT image reconstructions of the cervical spine were also generated. RADIATION DOSE REDUCTION: This exam was performed according to the departmental dose-optimization program which includes automated exposure control, adjustment of the mA and/or kV according to patient size and/or use of iterative reconstruction technique. COMPARISON:  MRI of the cervical spine March 13, 2020. FINDINGS: CT HEAD FINDINGS Brain: No evidence of acute infarction, hemorrhage, hydrocephalus, extra-axial collection or mass lesion/mass effect. Remote lacunar infarcts in the cerebellum, left thalamus and basal ganglia. Additional patchy white matter hypodensities, nonspecific but compatible with chronic microvascular ischemic  disease. Vascular: Calcific atherosclerosis. Skull: No acute fracture. Sinuses/Orbits: Clear sinuses.  No acute orbital findings. Other: No mastoid effusions. CT CERVICAL SPINE FINDINGS Alignment: Degenerative anterolisthesis of C3 on C4, C4 on C5, similar to prior MRI. No new malalignment. Dextrocurvature. Skull base and vertebrae: No evidence of acute fracture. Soft tissues and spinal canal: Visualized lung apices are clear. Disc levels: Multilevel facet uncovertebral hypertrophy which contributes (along with malalignment) to probably severe foraminal stenosis at multiple levels, including C3-C4 and C4-C5. Upper chest: Visualized lung apices are clear. IMPRESSION: CT head: 1. No evidence of acute intracranial abnormality. 2. Chronic microvascular ischemic disease. CT cervical spine: 1. No evidence of acute fracture or traumatic malalignment. 2. Probably severe foraminal stenosis at C3-C4 and C4-C5. An MRI could further characterize if clinically warranted. Electronically Signed   By: Margaretha Sheffield M.D.   On: 01/12/2022 16:17   CT Cervical Spine Wo Contrast  Result Date: 01/12/2022 CLINICAL DATA:  Head trauma, minor (Age >= 65y); Neck trauma (Age >= 65y) EXAM: CT HEAD WITHOUT CONTRAST CT CERVICAL SPINE WITHOUT CONTRAST TECHNIQUE: Multidetector CT imaging of the head and cervical spine was performed following the standard protocol without intravenous contrast. Multiplanar CT image reconstructions of the cervical spine were also generated. RADIATION DOSE REDUCTION: This exam was performed according to the departmental dose-optimization program which includes automated exposure control, adjustment of the mA and/or kV according to patient size and/or use of iterative reconstruction technique. COMPARISON:  MRI of the cervical spine March 13, 2020. FINDINGS: CT HEAD FINDINGS Brain: No evidence of acute infarction, hemorrhage, hydrocephalus, extra-axial collection or mass lesion/mass effect. Remote lacunar  infarcts in the cerebellum, left thalamus and basal ganglia. Additional patchy white matter hypodensities, nonspecific but compatible with chronic microvascular ischemic disease. Vascular: Calcific atherosclerosis. Skull: No acute fracture. Sinuses/Orbits: Clear sinuses.  No acute orbital findings. Other: No mastoid effusions. CT CERVICAL SPINE FINDINGS Alignment: Degenerative anterolisthesis of C3 on C4, C4 on C5, similar to prior MRI. No new malalignment. Dextrocurvature. Skull base and vertebrae: No evidence of acute fracture. Soft tissues and spinal canal: Visualized lung apices are clear. Disc levels: Multilevel facet uncovertebral hypertrophy which contributes (along with malalignment) to probably severe foraminal stenosis at multiple levels, including C3-C4 and C4-C5. Upper chest: Visualized lung apices are clear. IMPRESSION: CT head: 1. No evidence of acute intracranial abnormality. 2. Chronic microvascular ischemic disease. CT cervical spine: 1. No evidence of acute fracture or traumatic malalignment. 2. Probably severe foraminal stenosis at C3-C4 and C4-C5. An MRI could further characterize if clinically warranted. Electronically Signed   By: Margaretha Sheffield M.D.   On: 01/12/2022 16:17  DG Hip Unilat W or Wo Pelvis 2-3 Views Left  Result Date: 01/12/2022 CLINICAL DATA:  Chronic wound. Pressure ulcer at the posterolateral left hip abutting the left femoral greater trochanter. Scheduled surgery 6 days from now. EXAM: DG HIP (WITH OR WITHOUT PELVIS) 2-3V LEFT COMPARISON:  Pelvis and left hip radiographs 12/11/2021; MRI pelvis without and with contrast 12/25/2021 FINDINGS: Mild to moderate bilateral superomedial femoroacetabular joint space narrowing. The bilateral sacroiliac and pubic symphysis joint spaces are maintained. Surgical clips overlie the right groin. Left common and external iliac arterial stent grafts. There are scattered air lucencies within the soft tissues just lateral to the left  greater trochanter. Minimal lateral left greater trochanter cortical erosion as better seen on 12/25/2021 MRI. No acute fracture or dislocation. Moderate to severe left L4-5 disc space narrowing with degenerative vacuum disc. IMPRESSION: 1. Scattered air lucencies within the soft tissues just lateral to the left greater trochanter consistent with the wound sinus tract seen on 12/25/2021 MRI. 2. Minimal lateral left greater trochanter cortical erosion as better seen on 12/25/2021 MRI where findings were concerning for osteomyelitis. Electronically Signed   By: Yvonne Kendall M.D.   On: 01/12/2022 15:52   DG Chest Portable 1 View  Result Date: 01/12/2022 CLINICAL DATA:  Chronic wound. Pressure ulcer of left hip/buttocks. Scheduled for surgery in 6 days. EXAM: PORTABLE CHEST 1 VIEW COMPARISON:  AP chest 09/05/2020 FINDINGS: Status post median sternotomy and CABG. Cardiac silhouette is at the upper limits of normal size. Mild-to-moderate calcification within the aortic arch. The lungs are clear. No pleural effusion or pneumothorax. No acute skeletal abnormality. IMPRESSION: No acute cardiopulmonary disease. Electronically Signed   By: Yvonne Kendall M.D.   On: 01/12/2022 15:39    Pertinent labs & imaging results that were available during my care of the patient were reviewed by me and considered in my medical decision making (see MDM for details).  Medications Ordered in ED Medications  cefTRIAXone (ROCEPHIN) 1 g in sodium chloride 0.9 % 100 mL IVPB (1 g Intravenous New Bag/Given 01/12/22 1727)                                                                                                                                     Procedures Procedures  (including critical care time)  Medical Decision Making / ED Course   MDM:  MAMORU TAKESHITA is a 71 y.o. male with past medical history as above, significant for left trochanter osteomyelitis, as above which further complicates the presenting complaint. Pt  presents to the ED with complaint of weakness. The complaint involves an extensive differential diagnosis and also carries with it a high risk of complications and morbidity.  Serious etiology was considered. Ddx includes but is not limited to: dehydration, infectious, metabolic, acs, intracranial injury, etc  Differential diagnoses for head trauma includes subdural hematoma, epidural hematoma, acute concussion, traumatic subarachnoid hemorrhage, cerebral contusions, etc.   On  initial assessment the patient is: resting comfortably HDS on ambient air Vital signs and nursing notes were reviewed  Clinical Course as of 01/12/22 1745  Fri Jan 12, 2022  1738 Pt with UTI, will send culture, not septic. Mild leukocytosis at 12.7. Start rocephin. Na is low chronically, Cr stable. BNP is elevated, he has no respiratory complaints.  [SG]  1739 Pt is feeling somewhat better, HDS. If he is able to ambulate at his typical level (w/ walker) would be reasonable to discharge home, o/w would favor admission. Signed out to incoming EDP pending re-assessment [SG]    Clinical Course User Index [SG] Jeanell Sparrow, DO     Additional history obtained: -Additional history obtained from ems -External records from outside source obtained and reviewed including: Chart review including previous notes, labs, imaging, consultation notes including OSH, prior labs/imaging/ed visits   Lab Tests: -I ordered, reviewed, and interpreted labs.   The pertinent results include:   Labs Reviewed  CBC WITH DIFFERENTIAL/PLATELET - Abnormal; Notable for the following components:      Result Value   WBC 12.7 (*)    RBC 3.47 (*)    Hemoglobin 11.5 (*)    HCT 33.8 (*)    Neutro Abs 11.3 (*)    Lymphs Abs 0.5 (*)    All other components within normal limits  COMPREHENSIVE METABOLIC PANEL - Abnormal; Notable for the following components:   Sodium 129 (*)    Chloride 96 (*)    Glucose, Bld 106 (*)    Calcium 8.8 (*)     Albumin 2.9 (*)    All other components within normal limits  URINALYSIS, ROUTINE W REFLEX MICROSCOPIC - Abnormal; Notable for the following components:   APPearance HAZY (*)    Hgb urine dipstick SMALL (*)    Ketones, ur 5 (*)    Leukocytes,Ua LARGE (*)    Bacteria, UA MANY (*)    All other components within normal limits  BRAIN NATRIURETIC PEPTIDE - Abnormal; Notable for the following components:   B Natriuretic Peptide 492.0 (*)    All other components within normal limits  RESP PANEL BY RT-PCR (RSV, FLU A&B, COVID)  RVPGX2  URINE CULTURE  CULTURE, BLOOD (ROUTINE X 2)  CULTURE, BLOOD (ROUTINE X 2)  LIPASE, BLOOD  TSH  T4, FREE  LACTIC ACID, PLASMA  LACTIC ACID, PLASMA  TROPONIN I (HIGH SENSITIVITY)  TROPONIN I (HIGH SENSITIVITY)    Notable for mild leukocytosis, mild hyponatremia similar to prior, Cr simialr to baseline. UTI  EKG   EKG Interpretation  Date/Time:    Ventricular Rate:    PR Interval:    QRS Duration:   QT Interval:    QTC Calculation:   R Axis:     Text Interpretation:           Imaging Studies ordered: I ordered imaging studies including CT head, ct cervical, xr hip, cxr On my interpretation imaging demonstrates likely chronic osteo left hip I independently visualized and interpreted imaging. I agree with the radiologist interpretation   Medicines ordered and prescription drug management: Meds ordered this encounter  Medications   cefTRIAXone (ROCEPHIN) 1 g in sodium chloride 0.9 % 100 mL IVPB    Order Specific Question:   Antibiotic Indication:    Answer:   UTI    -I have reviewed the patients home medicines and have made adjustments as needed   Consultations Obtained: I requested consultation with the na,  and discussed lab and imaging findings  as well as pertinent plan - they recommend: na   Cardiac Monitoring: The patient was maintained on a cardiac monitor.  I personally viewed and interpreted the cardiac monitored which showed  an underlying rhythm of: nsr  Social Determinants of Health:  Diagnosis or treatment significantly limited by social determinants of health: current smoker   Reevaluation: After the interventions noted above, I reevaluated the patient and found that they have improved  Co morbidities that complicate the patient evaluation  Past Medical History:  Diagnosis Date   Acute respiratory failure with hypoxia ([place])    Acute systolic congestive heart failure ([place]) 02/11/2014   COPD (chronic obstructive pulmonary disease) ([place]) 02/11/2014   Essential hypertension    Hypercholesteremia    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  Despite best efforts to proofread,  errors can occur which can change the documentation meaning.    Jeanell Sparrow, DO 01/12/22 1745

## 2022-01-12 NOTE — ED Notes (Signed)
Patient repositioned for comfort.

## 2022-01-12 NOTE — ED Notes (Signed)
Patient able to stand up with 2 assist in the side of the bed but unable to walk. EDP informed.

## 2022-01-12 NOTE — H&P (Addendum)
History and Physical    Caleb [name] DSK:[id number] DOB: 10-06-1950 DOA: 01/12/2022  PCP: Percell Belt, DO   Patient coming from: Home   Chief Complaint: general weakness, falls  HPI: Caleb [name] is a pleasant 71 y.o. male with medical history significant for hypertension, CAD status post CABG, PAD status post right femoral to popliteal bypass and bilateral iliac stenting, chronic HFmrEF, SIADH, COPD, cervical spinal stenosis with chronic pain, and left hip ulcer with trochanteric osteomyelitis who presents to the emergency department with generalized weakness and falls.  Patient typically ambulates with a walker but has had increasing difficulty with this due to generalized weakness over the past few days.  He has even been experiencing difficulty standing on his own for the past 2 to 3 days.  He denies focal numbness or weakness.  He denies dysuria, suprapubic pain, flank pain, fever, chills, chest pain, or shortness of breath. He states that his right leg swelling is chronic and unchanged.   ED Course: Upon arrival to the ED, patient is found to be afebrile and saturating mid 90s on room air with stable blood pressure.  No acute findings noted on head CT and no acute fracture or traumatic malalignment on cervical spine CT.  Chest x-ray is negative for acute cardiopulmonary disease.  Chemistry panel notable for sodium 129 and albumin 2.9.  CBC features a leukocytosis to 12,700.  Lactic acid is normal.  Family requested admission to [facility].  ED physician reached out to [facility] but there were no beds available.  ED physician also discussed the case with plastic surgery at [facility] as patient is scheduled for debridement on 01/18/2021, but they indicated that this is an elective procedure and could be delayed.  Blood and urine cultures were collected in the emergency department and the patient was treated with 1 g IV Rocephin.  Review of Systems:  All other systems  reviewed and apart from HPI, are negative.  Past Medical History:  Diagnosis Date   Acute respiratory failure with hypoxia (HCC)    Acute systolic congestive heart failure ([place]) 02/11/2014   COPD (chronic obstructive pulmonary disease) ([place]) 02/11/2014   Essential hypertension    Hypercholesteremia    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   LIMA to LAD, RIMA to RCA, SVG to OM1, EVH via right thigh   Tobacco abuse     Past Surgical History:  Procedure Laterality Date   BIOPSY  10/31/2018   Procedure: BIOPSY;  Surgeon: Carol Ada, MD;  Location: [facility];  Service: Endoscopy;;   cataract surgery  Bilateral 12-2017 and 01-2018   COLONOSCOPY WITH PROPOFOL N/A 10/31/2018   Procedure: COLONOSCOPY WITH PROPOFOL;  Surgeon: Carol Ada, MD;  [facility];  Service: Endoscopy;  Laterality: N/A;   COLONOSCOPY WITH PROPOFOL N/A 09/18/2019   Procedure: COLONOSCOPY WITH PROPOFOL;  Surgeon: Carol Ada, MD;  [facility];  Service: Endoscopy;  Laterality: N/A;   COLONOSCOPY WITH PROPOFOL N/A 01/20/2021   Procedure: COLONOSCOPY WITH PROPOFOL;  Surgeon: Carol Ada, MD;  [facility];  Service: Endoscopy;  Laterality: N/A;   COLONOSCOPY WITH PROPOFOL N/A 11/03/2021   Procedure: COLONOSCOPY WITH PROPOFOL;  Surgeon: Carol Ada, MD;  [facility];  Service: Gastroenterology;  Laterality: N/A;   CORONARY ARTERY BYPASS GRAFT N/A 02/17/2014   Procedure: CORONARY ARTERY BYPASS GRAFTING (CABG);  Surgeon: Rexene Alberts, MD;  Location: [place];  Service: Open Heart Surgery;  Laterality: N/A;  Times 3 using bilateral  mammary arteries and endoscopically harvested right saphenous vein   ESOPHAGOGASTRODUODENOSCOPY (EGD) WITH PROPOFOL N/A 10/31/2018   Procedure: ESOPHAGOGASTRODUODENOSCOPY (EGD) WITH PROPOFOL;  Surgeon: Carol Ada, MD;  [facility];  Service: Endoscopy;  Laterality: N/A;   ESOPHAGOGASTRODUODENOSCOPY (EGD) WITH PROPOFOL N/A 09/18/2019   Procedure:  ESOPHAGOGASTRODUODENOSCOPY (EGD) WITH PROPOFOL;  Surgeon: Carol Ada, MD;  [facility];  Service: Endoscopy;  Laterality: N/A;   FEMORAL ARTERY - FEMORAL ARTERY BYPASS GRAFT      right femoral artery to below-knee popliteal artery bypass with PTFE and right first ray amputation 04/25/17   HEMOSTASIS CLIP PLACEMENT  10/31/2018   Procedure: HEMOSTASIS CLIP PLACEMENT;  Surgeon: Carol Ada, MD;  [facility];  Service: Endoscopy;;   HEMOSTASIS CLIP PLACEMENT  09/18/2019   Procedure: HEMOSTASIS CLIP PLACEMENT;  Surgeon: Carol Ada, MD;  [facility];  Service: Endoscopy;;   HEMOSTASIS CLIP PLACEMENT  01/20/2021   Procedure: HEMOSTASIS CLIP PLACEMENT;  Surgeon: Carol Ada, MD;  [facility];  Service: Endoscopy;;   INTRAOPERATIVE TRANSESOPHAGEAL ECHOCARDIOGRAM N/A 02/17/2014   Procedure: INTRAOPERATIVE TRANSESOPHAGEAL ECHOCARDIOGRAM;  Surgeon: Rexene Alberts, MD;  Location: [place];  Service: Open Heart Surgery;  Laterality: N/A;   LEFT HEART CATHETERIZATION WITH CORONARY ANGIOGRAM N/A 02/12/2014   Procedure: LEFT HEART CATHETERIZATION WITH CORONARY ANGIOGRAM;  Surgeon: Sinclair Grooms, MD;  Location: [facility];  Service: Cardiovascular;  Laterality: N/A;   POLYPECTOMY  10/31/2018   Procedure: POLYPECTOMY;  Surgeon: Carol Ada, MD;  [facility];  Service: Endoscopy;;   POLYPECTOMY  09/18/2019   Procedure: POLYPECTOMY;  Surgeon: Carol Ada, MD;  [facility];  Service: Endoscopy;;   POLYPECTOMY  01/20/2021   Procedure: POLYPECTOMY;  Surgeon: Carol Ada, MD;  [facility];  Service: Endoscopy;;   POLYPECTOMY  11/03/2021   Procedure: POLYPECTOMY;  Surgeon: Carol Ada, MD;  [facility];  Service: Gastroenterology;;   Clide Deutscher  09/18/2019   Procedure: Clide Deutscher;  Surgeon: Carol Ada, MD;  [facility];  Service: Endoscopy;;   SUBMUCOSAL INJECTION  09/18/2019   Procedure: SUBMUCOSAL  INJECTION;  Surgeon: Carol Ada, MD;  [facility];  Service: Endoscopy;;   SUBMUCOSAL LIFTING INJECTION  10/31/2018   Procedure: SUBMUCOSAL LIFTING INJECTION;  Surgeon: Carol Ada, MD;  [facility];  Service: Endoscopy;;   TOE AMPUTATION Right    right great toe     Social History:   reports that he has been smoking cigarettes. He has a 17.50 pack-year smoking history. He has never used smokeless tobacco. He reports current alcohol use. He reports that he does not use drugs.  Allergies  Allergen Reactions   Bactrim [Sulfamethoxazole-Trimethoprim] Other (See Comments)    "Made potassium go high and sodium go low"   Lisinopril Cough and Other (See Comments)   Losartan Swelling    Pt's daughter stated it made pt's blood pressure and sodium too low, but pt has been tolerating irbesartan.   Pletal [Cilostazol] Swelling and Other (See Comments)    Site of swelling not recalled by the patient    Family History  Problem Relation Age of Onset   Heart attack Mother    Diabetes Mother    Heart attack Brother    Diabetes Brother    Cancer Sister    Stroke Neg Hx      Prior to Admission medications   Medication Sig Start Date End Date Taking? Authorizing Provider  acetaminophen (TYLENOL) 500 MG tablet Take 1,000 mg by mouth 2 (two)  times daily as needed for mild pain or moderate pain.     [provider]  albuterol (VENTOLIN HFA) 108 (90 Base) MCG/ACT inhaler Inhale 2 puffs into the lungs every 6 (six) hours as needed for wheezing or shortness of breath. 07/24/18   Laurin Coder, MD  alendronate (FOSAMAX) 70 MG tablet Take 70 mg by mouth every Saturday.    [provider]  amLODipine (NORVASC) 5 MG tablet Take 5 mg by mouth daily.    [provider]  aspirin EC 81 MG tablet Take 81 mg by mouth daily. Swallow whole.    [provider]  atorvastatin (LIPITOR) 80 MG tablet Take 1 tablet (80 mg total) by mouth daily. 12/14/15    Josue Hector, MD  carvedilol (COREG) 25 MG tablet Take 25 mg by mouth 2 (two) times daily with a meal.    [provider]  Cholecalciferol (VITAMIN D-3) 25 MCG (1000 UT) CAPS Take 1,000 Units by mouth daily with breakfast.    [provider]  ferrous sulfate 325 (65 FE) MG tablet Take 325 mg by mouth daily with breakfast.    [provider]  fexofenadine (ALLEGRA) 180 MG tablet Take 180 mg by mouth daily.    [provider]  folic acid (FOLVITE) 1 MG tablet Take 1 mg by mouth daily. 08/01/20   [provider]  irbesartan (AVAPRO) 300 MG tablet Take 300 mg by mouth daily.    [provider]  nitroGLYCERIN (NITROSTAT) 0.4 MG SL tablet Place 0.4 mg under the tongue every 5 (five) minutes as needed for chest pain.    [provider]  Olopatadine HCl 0.2 % SOLN Place 1 drop into both eyes daily as needed (allergies).    [provider]  pantoprazole (PROTONIX) 40 MG tablet Take 40 mg by mouth daily. 04/16/20   [provider]  pregabalin (LYRICA) 75 MG capsule Take 75 mg by mouth 3 (three) times daily. 01/23/21   [provider]  rivaroxaban (XARELTO) 2.5 MG TABS tablet Take 2.5 mg by mouth 2 (two) times daily.    [provider]  thiamine (VITAMIN B1) 100 MG tablet Take 100 mg by mouth daily.    [provider]  vitamin B-12 (CYANOCOBALAMIN) 1000 MCG tablet Take 1,000 mcg by mouth daily with breakfast.    [provider]    Physical Exam: Vitals:   01/12/22 1700 01/12/22 1715 01/12/22 1730 01/12/22 1830  BP:  (!) 125/105 139/83 139/83  Pulse:  76 77 70  Resp:  (!) '21 16 17  '$ Temp: 98 F (36.7 C)     TempSrc:      SpO2:  96% 95% 96%    Constitutional: NAD, calm  Eyes: PERTLA, lids and conjunctivae normal ENMT: Mucous membranes are moist. Posterior pharynx clear of any exudate or lesions.   Neck: supple, no masses  Respiratory:  no wheezing, no crackles. No accessory muscle use.   Cardiovascular: S1 & S2 heard, regular rate and rhythm. Right leg swelling. Abdomen: No distension, no tenderness, soft. Bowel sounds active.  Musculoskeletal: no clubbing / cyanosis. No joint deformity upper and lower extremities.   Skin: Left hip ulcer. Warm, dry, well-perfused. Neurologic: CN 2-12 grossly intact. Moving all extremities. Alert and oriented.  Psychiatric: Pleasant. Cooperative.    Labs and Imaging on Admission: I have personally reviewed following labs and imaging studies  CBC: Recent Labs  Lab 01/12/22 1520  WBC 12.7*  NEUTROABS 11.3*  HGB 11.5*  HCT 33.8*  MCV 97.4  PLT 408   Basic Metabolic Panel: Recent Labs  Lab 01/12/22 1520  NA 129*  K 4.8  CL 96*  CO2 23  GLUCOSE 106*  BUN 12  CREATININE 0.75  CALCIUM 8.8*   GFR: CrCl cannot be calculated (Unknown ideal weight.). Liver Function Tests: Recent Labs  Lab 01/12/22 1520  AST 15  ALT 9  ALKPHOS 69  BILITOT 0.8  PROT 7.1  ALBUMIN 2.9*   Recent Labs  Lab 01/12/22 1520  LIPASE 24   No results for input(s): "AMMONIA" in the last 168 hours. Coagulation Profile: No results for input(s): "INR", "PROTIME" in the last 168 hours. Cardiac Enzymes: No results for input(s): "CKTOTAL", "CKMB", "CKMBINDEX", "TROPONINI" in the last 168 hours. BNP (last 3 results) No results for input(s): "PROBNP" in the last 8760 hours. HbA1C: No results for input(s): "HGBA1C" in the last 72 hours. CBG: No results for input(s): "GLUCAP" in the last 168 hours. Lipid Profile: No results for input(s): "CHOL", "HDL", "LDLCALC", "TRIG", "CHOLHDL", "LDLDIRECT" in the last 72 hours. Thyroid Function Tests: Recent Labs    01/12/22 1520  TSH 1.412   Anemia Panel: No results for input(s): "VITAMINB12", "FOLATE", "FERRITIN", "TIBC", "IRON", "RETICCTPCT" in the last 72 hours. Urine analysis:    Component Value Date/Time   COLORURINE YELLOW 01/12/2022 1653   APPEARANCEUR HAZY (A) 01/12/2022 1653   LABSPEC 1.010  01/12/2022 1653   PHURINE 5.0 01/12/2022 1653   GLUCOSEU NEGATIVE 01/12/2022 1653   HGBUR SMALL (A) 01/12/2022 1653   BILIRUBINUR NEGATIVE 01/12/2022 1653   KETONESUR 5 (A) 01/12/2022 1653   PROTEINUR NEGATIVE 01/12/2022 1653   UROBILINOGEN 0.2 02/13/2014 1645   NITRITE NEGATIVE 01/12/2022 1653   LEUKOCYTESUR LARGE (A) 01/12/2022 1653   Sepsis Labs: '@LABRCNTIP'$ (procalcitonin:4,lacticidven:4) ) Recent Results (from the past 240 hour(s))  Resp panel by RT-PCR (RSV, Flu A&B, Covid) Anterior Nasal Swab     Status: None   Collection Time: 01/12/22  3:20 PM   Specimen: Anterior Nasal Swab  Result Value Ref Range Status   SARS Coronavirus 2 by RT PCR NEGATIVE NEGATIVE Final    Comment: (NOTE) SARS-CoV-2 target nucleic acids are NOT DETECTED.  The SARS-CoV-2 RNA is generally detectable in upper respiratory specimens during the acute phase of infection. The lowest concentration of SARS-CoV-2 viral copies this assay can detect is 138 copies/mL. A negative result does not preclude SARS-Cov-2 infection and should not be used as the sole basis for treatment or other patient management decisions. A negative result may occur with  improper specimen collection/handling, submission of specimen other than nasopharyngeal swab, presence of viral mutation(s) within the areas targeted by this assay, and inadequate number of viral copies(<138 copies/mL). A negative result must be combined with clinical observations, patient history, and epidemiological information. The expected result is Negative.  Fact Sheet for Patients:  EntrepreneurPulse.com.au  Fact Sheet for Healthcare Providers:  IncredibleEmployment.be  This test is no t yet approved or cleared by the [place] FDA and  has been authorized for detection and/or diagnosis of SARS-CoV-2 by FDA under an Emergency Use Authorization (EUA). This EUA will remain  in effect (meaning this test can be used)  for the duration of the COVID-19 declaration under Section 564(b)(1) of the Act, 21 U.S.C.section 360bbb-3(b)(1), unless the authorization is terminated  or revoked sooner.       Influenza A by PCR NEGATIVE NEGATIVE Final   Influenza B by PCR NEGATIVE NEGATIVE Final    Comment: (NOTE) The  Xpert Xpress SARS-CoV-2/FLU/RSV plus assay is intended as an aid in the diagnosis of influenza from Nasopharyngeal swab specimens and should not be used as a sole basis for treatment. Nasal washings and aspirates are unacceptable for Xpert Xpress SARS-CoV-2/FLU/RSV testing.  Fact Sheet for Patients: EntrepreneurPulse.com.au  Fact Sheet for Healthcare Providers: IncredibleEmployment.be  This test is not yet approved or cleared by the [place] FDA and has been authorized for detection and/or diagnosis of SARS-CoV-2 by FDA under an Emergency Use Authorization (EUA). This EUA will remain in effect (meaning this test can be used) for the duration of the COVID-19 declaration under Section 564(b)(1) of the Act, 21 U.S.C. section 360bbb-3(b)(1), unless the authorization is terminated or revoked.     Resp Syncytial Virus by PCR NEGATIVE NEGATIVE Final    Comment: (NOTE) Fact Sheet for Patients: EntrepreneurPulse.com.au  Fact Sheet for Healthcare Providers: IncredibleEmployment.be  This test is not yet approved or cleared by the [place] FDA and has been authorized for detection and/or diagnosis of SARS-CoV-2 by FDA under an Emergency Use Authorization (EUA). This EUA will remain in effect (meaning this test can be used) for the duration of the COVID-19 declaration under Section 564(b)(1) of the Act, 21 U.S.C. section 360bbb-3(b)(1), unless the authorization is terminated or revoked.  Performed at [facility], [place] [address], [place], [place] [address]      Radiological Exams on  Admission: CT Head Wo Contrast  Result Date: 01/12/2022 CLINICAL DATA:  Head trauma, minor (Age >= 65y); Neck trauma (Age >= 65y) EXAM: CT HEAD WITHOUT CONTRAST CT CERVICAL SPINE WITHOUT CONTRAST TECHNIQUE: Multidetector CT imaging of the head and cervical spine was performed following the standard protocol without intravenous contrast. Multiplanar CT image reconstructions of the cervical spine were also generated. RADIATION DOSE REDUCTION: This exam was performed according to the departmental dose-optimization program which includes automated exposure control, adjustment of the mA and/or kV according to patient size and/or use of iterative reconstruction technique. COMPARISON:  MRI of the cervical spine March 13, 2020. FINDINGS: CT HEAD FINDINGS Brain: No evidence of acute infarction, hemorrhage, hydrocephalus, extra-axial collection or mass lesion/mass effect. Remote lacunar infarcts in the cerebellum, left thalamus and basal ganglia. Additional patchy white matter hypodensities, nonspecific but compatible with chronic microvascular ischemic disease. Vascular: Calcific atherosclerosis. Skull: No acute fracture. Sinuses/Orbits: Clear sinuses.  No acute orbital findings. Other: No mastoid effusions. CT CERVICAL SPINE FINDINGS Alignment: Degenerative anterolisthesis of C3 on C4, C4 on C5, similar to prior MRI. No new malalignment. Dextrocurvature. Skull base and vertebrae: No evidence of acute fracture. Soft tissues and spinal canal: Visualized lung apices are clear. Disc levels: Multilevel facet uncovertebral hypertrophy which contributes (along with malalignment) to probably severe foraminal stenosis at multiple levels, including C3-C4 and C4-C5. Upper chest: Visualized lung apices are clear. IMPRESSION: CT head: 1. No evidence of acute intracranial abnormality. 2. Chronic microvascular ischemic disease. CT cervical spine: 1. No evidence of acute fracture or traumatic malalignment. 2. Probably severe  foraminal stenosis at C3-C4 and C4-C5. An MRI could further characterize if clinically warranted. Electronically Signed   By: Margaretha Sheffield M.D.   On: 01/12/2022 16:17   CT Cervical Spine Wo Contrast  Result Date: 01/12/2022 CLINICAL DATA:  Head trauma, minor (Age >= 65y); Neck trauma (Age >= 65y) EXAM: CT HEAD WITHOUT CONTRAST CT CERVICAL SPINE WITHOUT CONTRAST TECHNIQUE: Multidetector CT imaging of the head and cervical spine was performed following the standard protocol without intravenous contrast. Multiplanar CT image reconstructions of the  cervical spine were also generated. RADIATION DOSE REDUCTION: This exam was performed according to the departmental dose-optimization program which includes automated exposure control, adjustment of the mA and/or kV according to patient size and/or use of iterative reconstruction technique. COMPARISON:  MRI of the cervical spine March 13, 2020. FINDINGS: CT HEAD FINDINGS Brain: No evidence of acute infarction, hemorrhage, hydrocephalus, extra-axial collection or mass lesion/mass effect. Remote lacunar infarcts in the cerebellum, left thalamus and basal ganglia. Additional patchy white matter hypodensities, nonspecific but compatible with chronic microvascular ischemic disease. Vascular: Calcific atherosclerosis. Skull: No acute fracture. Sinuses/Orbits: Clear sinuses.  No acute orbital findings. Other: No mastoid effusions. CT CERVICAL SPINE FINDINGS Alignment: Degenerative anterolisthesis of C3 on C4, C4 on C5, similar to prior MRI. No new malalignment. Dextrocurvature. Skull base and vertebrae: No evidence of acute fracture. Soft tissues and spinal canal: Visualized lung apices are clear. Disc levels: Multilevel facet uncovertebral hypertrophy which contributes (along with malalignment) to probably severe foraminal stenosis at multiple levels, including C3-C4 and C4-C5. Upper chest: Visualized lung apices are clear. IMPRESSION: CT head: 1. No evidence of acute  intracranial abnormality. 2. Chronic microvascular ischemic disease. CT cervical spine: 1. No evidence of acute fracture or traumatic malalignment. 2. Probably severe foraminal stenosis at C3-C4 and C4-C5. An MRI could further characterize if clinically warranted. Electronically Signed   By: Margaretha Sheffield M.D.   On: 01/12/2022 16:17   DG Hip Unilat W or Wo Pelvis 2-3 Views Left  Result Date: 01/12/2022 CLINICAL DATA:  Chronic wound. Pressure ulcer at the posterolateral left hip abutting the left femoral greater trochanter. Scheduled surgery 6 days from now. EXAM: DG HIP (WITH OR WITHOUT PELVIS) 2-3V LEFT COMPARISON:  Pelvis and left hip radiographs 12/11/2021; MRI pelvis without and with contrast 12/25/2021 FINDINGS: Mild to moderate bilateral superomedial femoroacetabular joint space narrowing. The bilateral sacroiliac and pubic symphysis joint spaces are maintained. Surgical clips overlie the right groin. Left common and external iliac arterial stent grafts. There are scattered air lucencies within the soft tissues just lateral to the left greater trochanter. Minimal lateral left greater trochanter cortical erosion as better seen on 12/25/2021 MRI. No acute fracture or dislocation. Moderate to severe left L4-5 disc space narrowing with degenerative vacuum disc. IMPRESSION: 1. Scattered air lucencies within the soft tissues just lateral to the left greater trochanter consistent with the wound sinus tract seen on 12/25/2021 MRI. 2. Minimal lateral left greater trochanter cortical erosion as better seen on 12/25/2021 MRI where findings were concerning for osteomyelitis. Electronically Signed   By: Yvonne Kendall M.D.   On: 01/12/2022 15:52   DG Chest Portable 1 View  Result Date: 01/12/2022 CLINICAL DATA:  Chronic wound. Pressure ulcer of left hip/buttocks. Scheduled for surgery in 6 days. EXAM: PORTABLE CHEST 1 VIEW COMPARISON:  AP chest 09/05/2020 FINDINGS: Status post median sternotomy and CABG.  Cardiac silhouette is at the upper limits of normal size. Mild-to-moderate calcification within the aortic arch. The lungs are clear. No pleural effusion or pneumothorax. No acute skeletal abnormality. IMPRESSION: No acute cardiopulmonary disease. Electronically Signed   By: Yvonne Kendall M.D.   On: 01/12/2022 15:39    EKG: Independently reviewed. Accelerated junctional rhythm, non-specific IVCD.   Assessment/Plan   1. General weakness, falls  - No acute findings on head CT and no focal deficits identified; TSH normal in ED  - He is being treated for UTI in ED but pt denies any urinary sxs   - Check mag, phos, CK, B12, and ammonia,  consult PT   2. Left hip ulcer; trochanteric osteomyelitis  - Followed by ID and plastic surgery at [facility]; ED physician spoke with Dr. Quentin Cornwall of plastic surgery at [facility] who indicated the upcoming debridement is elective and can be delayed if needed, no need to admit pt to [place] wound care    3. CAD  - No anginal complaints  - Continue ASA and Lipitor   4. PAD  - No acute ischemia  - Continue ASA, Lipitor, and Xarelto    5. COPD - Not in exacerbation   - Continue as-needed albuterol    6. SIADH  - Appears stable  - Continue fluid-restrictions and salt tabs   7. Cervical spinal stenosis; chronic pain  - Followed at [facility], was not felt to be a good candidate for surgery or epidural injections  - Stable, continue medical management    DVT prophylaxis: Xarelto Code Status: Full  Level of Care: Level of care: Med-Surg Family Communication: Daughter updated by ED  Disposition Plan:  Patient is from: Home  Anticipated d/c is to: TBD Anticipated d/c date is: 01/13/22  Patient currently: pending PT eval, treatment of UTI   Consults called: none  Admission status: Observation     Vianne Bulls, MD [facility]  01/12/2022, 7:35 PM

## 2022-01-12 NOTE — ED Provider Notes (Signed)
  Physical Exam  BP 139/83   Pulse 70   Temp 98 F (36.7 C)   Resp 17   SpO2 96%   Physical Exam  Procedures  Procedures  ED Course / MDM   Clinical Course as of 01/12/22 1912  Fri Jan 12, 2022  1738 Pt with UTI, will send culture, not septic. Mild leukocytosis at 12.7. Start rocephin. Na is low chronically, Cr stable. BNP is elevated, he has no respiratory complaints.  [SG]  1739 Pt is feeling somewhat better, HDS. If he is able to ambulate at his typical level (w/ walker) would be reasonable to discharge home, o/w would favor admission. Signed out to incoming EDP pending re-assessment [SG]    Clinical Course User Index [SG] Jeanell Sparrow, DO   Medical Decision Making Care assumed at 4 PM.  Patient is here with weakness and urinary frequency.  Patient's white blood cell count is elevated at 12.  Patient also has UTI and was given Rocephin.  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  He has been following with plastic surgery and orthopedic surgery at [facility].  Patient walks with a walker at baseline.  Signed out pending ambulation and home versus admission.  5 pm Patient is unable to ambulate even with 2 people assistance.  He walks with a walker at baseline.  I discussed case with [facility].  I talked to Dr. Quentin Cornwall from plastic surgery.  He states that patient has a wound debridement and biopsy in the week but he would delay the procedure since he has a UTI.  I talked to the daughter regarding the patient.  She states that she has been changing his wound and it is bleeding more but has no purulent discharge.  I looked at the wound and does not appears to be infected.  I think the acute issue is UTI.  At this point, patient will be admitted for UTI with worsening weakness.  Problems Addressed: Acute cystitis without hematuria: acute illness or injury Minor head injury, initial encounter: acute illness or injury Weakness: acute illness or injury  Amount  and/or Complexity of Data Reviewed Labs: ordered. Decision-making details documented in ED Course. Radiology: ordered and independent interpretation performed. Decision-making details documented in ED Course.  Risk Prescription drug management. Decision regarding hospitalization.          Drenda Freeze, MD 01/12/22 [phone]

## 2022-01-12 NOTE — ED Triage Notes (Signed)
Patient BIB [facility] from home. He slipped out of bed this morning. Denies LOC or hitting head. Has pressure ulcer on left hip/buttocks that causes him pain. Scheduled for surgery on Thursday at [facility] for his ulcer.

## 2022-01-13 DIAGNOSIS — R531 Weakness: Principal | ICD-10-CM

## 2022-01-13 LAB — RESPIRATORY PANEL BY PCR

## 2022-01-13 LAB — BLOOD CULTURE ID PANEL (REFLEXED) - BCID2

## 2022-01-13 LAB — CBC
HCT: 31.1 % — ABNORMAL LOW (ref 39.0–52.0)
Hemoglobin: 10.2 g/dL — ABNORMAL LOW (ref 13.0–17.0)
MCH: 32.8 pg (ref 26.0–34.0)
MCHC: 32.8 g/dL (ref 30.0–36.0)
MCV: 100 fL (ref 80.0–100.0)
Platelets: 202 10*3/uL (ref 150–400)
RBC: 3.11 MIL/uL — ABNORMAL LOW (ref 4.22–5.81)
RDW: 13.1 % (ref 11.5–15.5)
WBC: 12 10*3/uL — ABNORMAL HIGH (ref 4.0–10.5)
nRBC: 0 % (ref 0.0–0.2)

## 2022-01-13 LAB — BASIC METABOLIC PANEL
Anion gap: 9 (ref 5–15)
BUN: 11 mg/dL (ref 8–23)
CO2: 22 mmol/L (ref 22–32)
Calcium: 8.4 mg/dL — ABNORMAL LOW (ref 8.9–10.3)
Chloride: 97 mmol/L — ABNORMAL LOW (ref 98–111)
Creatinine, Ser: 0.86 mg/dL (ref 0.61–1.24)
GFR, Estimated: >60 mL/min (ref >60)
Glucose, Bld: 83 mg/dL (ref 70–99)
Potassium: 4.3 mmol/L (ref 3.5–5.1)
Sodium: 128 mmol/L — ABNORMAL LOW (ref 135–145)

## 2022-01-13 LAB — T4, FREE: Free T4: 1.23 ng/dL — ABNORMAL HIGH (ref 0.61–1.12)

## 2022-01-13 LAB — VITAMIN B12: Vitamin B-12: 1384 pg/mL — ABNORMAL HIGH (ref 180–914)

## 2022-01-13 MED ORDER — TRAZODONE HCL 50 MG PO TABS: 50.0000 mg | ORAL_TABLET | Freq: Every evening | ORAL | Status: DC | PRN

## 2022-01-13 MED ORDER — HYDRALAZINE HCL 20 MG/ML IJ SOLN: 10.0000 mg | INTRAMUSCULAR | Status: DC | PRN

## 2022-01-13 MED ORDER — PREDNISONE 20 MG PO TABS
40.0000 mg | ORAL_TABLET | Freq: Every day | ORAL | Status: DC
  Administered 2022-01-13: 40 mg via ORAL
  Filled 2022-01-13: qty 2

## 2022-01-13 MED ORDER — SODIUM CHLORIDE 0.9 % IV SOLN
1.0000 g | INTRAVENOUS | Status: DC
  Administered 2022-01-13: 1 g via INTRAVENOUS
  Filled 2022-01-13: qty 10

## 2022-01-13 MED ORDER — IPRATROPIUM-ALBUTEROL 0.5-2.5 (3) MG/3ML IN SOLN
3.0000 mL | Freq: Two times a day (BID) | RESPIRATORY_TRACT | Status: DC
  Administered 2022-01-13: 3 mL via RESPIRATORY_TRACT
  Filled 2022-01-13 (×2): qty 3

## 2022-01-13 MED ORDER — GUAIFENESIN 100 MG/5ML PO LIQD
5.0000 mL | ORAL | Status: DC | PRN
  Administered 2022-01-14: 5 mL via ORAL
  Filled 2022-01-13: qty 10

## 2022-01-13 MED ORDER — METOPROLOL TARTRATE 5 MG/5ML IV SOLN: 5.0000 mg | INTRAVENOUS | Status: DC | PRN

## 2022-01-13 MED ORDER — IPRATROPIUM-ALBUTEROL 0.5-2.5 (3) MG/3ML IN SOLN: 3.0000 mL | RESPIRATORY_TRACT | Status: DC | PRN

## 2022-01-13 NOTE — ED Notes (Signed)
PT at bedside.

## 2022-01-13 NOTE — Progress Notes (Signed)
PROGRESS NOTE    Caleb [name]  RDE:[id number] DOB: [birth year] DOA: 01/12/2022 PCP: Percell Belt, DO   Brief Narrative:   71 y.o. male with medical history significant for hypertension, CAD status post CABG, PAD status post right femoral to popliteal bypass and bilateral iliac stenting, chronic HFmrEF, SIADH, COPD, cervical spinal stenosis with chronic pain, and left hip ulcer with trochanteric osteomyelitis who presents to the emergency department with generalized weakness and falls. Patient typically ambulates with a walker but has had increasing difficulty with this due to generalized weakness over the past few days.  In the ED CT head did not show any acute findings, chest x-ray was unremarkable.   Assessment & Plan:  Principal Problem:   General weakness Active Problems:   Essential hypertension   COPD (chronic obstructive pulmonary disease) (HCC)   Heart failure with mildly reduced ejection fraction (HFmrEF) (HCC)   SIADH (syndrome of inappropriate ADH production) (HCC)   Cervical spinal stenosis   CAD (coronary artery disease)   Chronic osteomyelitis (HCC)   Decubitus ulcer of left hip    General weakness, falls  -No clear etiology has been identified.  CT head does not show any focal neurodeficits.  TSH is normal.  CK minimally elevated.  Electrolytes overall are overall unremarkable besides mild hyponatremia.  UA suggestive of mild urinary tract infection therefore will empirically treat.  Urine cultures ordered. Check Vit D PT/OT If continues to feel weak, he has a extensive history of vasculopathy, we can consider getting MRI brain  Mild COPD exacerbation, acute bronchitis Started on p.o. prednisone, scheduled bronchodilators.  I-S/flutter valve.  Respiratory panel ordered.  Mild urinary tract infection - Order urine cultures.  Empiric IV Rocephin   Left hip ulcer; trochanteric osteomyelitis  - Followed by ID and plastic surgery at [facility]; ED physician spoke with Dr.  Quentin Cornwall of plastic surgery at [facility] who indicated the upcoming debridement is elective and can be delayed if needed, no need to admit pt to [facility]  - Continue wound care     CAD s/p CABG - No anginal complaints  - Continue ASA and Lipitor    PAD  with Right first ray amputation.  S/p Fem Bypass - No acute ischemia  - Continue ASA, Lipitor, and Xarelto        SIADH  - Appears stable  - Continue fluid-restrictions and salt tabs    Cervical spinal stenosis; chronic pain  - Followed at [facility], was not felt to be a good candidate for surgery or epidural injections  - Stable, continue medical management    DVT prophylaxis: Xarelto Code Status: Full Family Communication:    Still has quite a bit of abnormal breath sounds requiring steroids, aggressive bronchodilators, check respiratory panel.  PT/OT  Subjective:  Seen and examined at bedside, tells me he lives alone.  Feels extremely weak at this time.  Denies any nausea or vomiting.  Examination:  General exam: Appears calm and comfortable  Respiratory system: Bilateral diffuse rhonchi Cardiovascular system: S1 & S2 heard, RRR. No JVD, murmurs, rubs, gallops or clicks. No pedal edema. Gastrointestinal system: Abdomen is nondistended, soft and nontender. No organomegaly or masses felt. Normal bowel sounds heard. Central nervous system: Alert and oriented. No focal neurological deficits. Extremities: Symmetric 5 x 5 power. Skin: No rashes, lesions or ulcers Psychiatry: Judgement and insight appear normal. Mood & affect appropriate.     Objective: Vitals:   01/13/22 0630 01/13/22 0645 01/13/22 0700 01/13/22 0715  BP: (!) 151/58 [name])  129/49 (!) 144/48 (!) 140/45  Pulse: 76 71 69 70  Resp: '18 18 17 18  '$ Temp:      TempSrc:      SpO2: 97% 96% 97% 97%    Intake/Output Summary (Last 24 hours) at 01/13/2022 0817 Last data filed at 01/12/2022 1844 Gross per 24 hour  Intake 100 ml  Output --  Net 100 ml   There were no vitals  filed for this visit.   Data Reviewed:   CBC: Recent Labs  Lab 01/12/22 1520 01/13/22 0500  WBC 12.7* 12.0*  NEUTROABS 11.3*  --   HGB 11.5* 10.2*  HCT 33.8* 31.1*  MCV 97.4 100.0  PLT 207 588   Basic Metabolic Panel: Recent Labs  Lab 01/12/22 1520 01/12/22 2302 01/13/22 0500  NA 129*  --  128*  K 4.8  --  4.3  CL 96*  --  97*  CO2 23  --  22  GLUCOSE 106*  --  83  BUN 12  --  11  CREATININE 0.75  --  0.86  CALCIUM 8.8*  --  8.4*  MG  --  1.7  --   PHOS  --  2.7  --    GFR: CrCl cannot be calculated (Unknown ideal weight.). Liver Function Tests: Recent Labs  Lab 01/12/22 1520  AST 15  ALT 9  ALKPHOS 69  BILITOT 0.8  PROT 7.1  ALBUMIN 2.9*   Recent Labs  Lab 01/12/22 1520  LIPASE 24   Recent Labs  Lab 01/12/22 2302  AMMONIA 20   Coagulation Profile: No results for input(s): "INR", "PROTIME" in the last 168 hours. Cardiac Enzymes: Recent Labs  Lab 01/12/22 2302  CKTOTAL 47*   BNP (last 3 results) No results for input(s): "PROBNP" in the last 8760 hours. HbA1C: No results for input(s): "HGBA1C" in the last 72 hours. CBG: No results for input(s): "GLUCAP" in the last 168 hours. Lipid Profile: No results for input(s): "CHOL", "HDL", "LDLCALC", "TRIG", "CHOLHDL", "LDLDIRECT" in the last 72 hours. Thyroid Function Tests: Recent Labs    01/12/22 1520  TSH 1.412   Anemia Panel: Recent Labs    01/12/22 2303  VITAMINB12 1,384*   Sepsis Labs: Recent Labs  Lab 01/12/22 1745  LATICACIDVEN 1.0    Recent Results (from the past 240 hour(s))  Resp panel by RT-PCR (RSV, Flu A&B, Covid) Anterior Nasal Swab     Status: None   Collection Time: 01/12/22  3:20 PM   Specimen: Anterior Nasal Swab  Result Value Ref Range Status   SARS Coronavirus 2 by RT PCR NEGATIVE NEGATIVE Final    Comment: (NOTE) SARS-CoV-2 target nucleic acids are NOT DETECTED.  The SARS-CoV-2 RNA is generally detectable in upper respiratory specimens during the acute  phase of infection. The lowest concentration of SARS-CoV-2 viral copies this assay can detect is 138 copies/mL. A negative result does not preclude SARS-Cov-2 infection and should not be used as the sole basis for treatment or other patient management decisions. A negative result may occur with  improper specimen collection/handling, submission of specimen other than nasopharyngeal swab, presence of viral mutation(s) within the areas targeted by this assay, and inadequate number of viral copies(<138 copies/mL). A negative result must be combined with clinical observations, patient history, and epidemiological information. The expected result is Negative.  Fact Sheet for Patients:  EntrepreneurPulse.com.au  Fact Sheet for Healthcare Providers:  IncredibleEmployment.be  This test is no t yet approved or cleared by the [place] and  has been authorized for detection and/or diagnosis of SARS-CoV-2 by FDA under an Emergency Use Authorization (EUA). This EUA will remain  in effect (meaning this test can be used) for the duration of the COVID-19 declaration under Section 564(b)(1) of the Act, 21 U.S.C.section 360bbb-3(b)(1), unless the authorization is terminated  or revoked sooner.       Influenza A by PCR NEGATIVE NEGATIVE Final   Influenza B by PCR NEGATIVE NEGATIVE Final    Comment: (NOTE) The Xpert Xpress SARS-CoV-2/FLU/RSV plus assay is intended as an aid in the diagnosis of influenza from Nasopharyngeal swab specimens and should not be used as a sole basis for treatment. Nasal washings and aspirates are unacceptable for Xpert Xpress SARS-CoV-2/FLU/RSV testing.  Fact Sheet for Patients: EntrepreneurPulse.com.au  Fact Sheet for Healthcare Providers: IncredibleEmployment.be  This test is not yet approved or cleared by the [place] FDA and has been authorized for detection and/or diagnosis of  SARS-CoV-2 by FDA under an Emergency Use Authorization (EUA). This EUA will remain in effect (meaning this test can be used) for the duration of the COVID-19 declaration under Section 564(b)(1) of the Act, 21 U.S.C. section 360bbb-3(b)(1), unless the authorization is terminated or revoked.     Resp Syncytial Virus by PCR NEGATIVE NEGATIVE Final    Comment: (NOTE) Fact Sheet for Patients: EntrepreneurPulse.com.au  Fact Sheet for Healthcare Providers: IncredibleEmployment.be  This test is not yet approved or cleared by the [place] FDA and has been authorized for detection and/or diagnosis of SARS-CoV-2 by FDA under an Emergency Use Authorization (EUA). This EUA will remain in effect (meaning this test can be used) for the duration of the COVID-19 declaration under Section 564(b)(1) of the Act, 21 U.S.C. section 360bbb-3(b)(1), unless the authorization is terminated or revoked.  Performed at [facility] [place] [address], [place], [place] [address]   Blood culture (routine x 2)     Status: None (Preliminary result)   Collection Time: 01/12/22  5:45 PM   Specimen: BLOOD  Result Value Ref Range Status   Specimen Description   Final    BLOOD SITE NOT SPECIFIED Performed at [facility] [address], [place], [place] [address]    Special Requests   Final    BOTTLES DRAWN AEROBIC AND ANAEROBIC Blood Culture results may not be optimal due to an inadequate volume of blood received in culture bottles Performed at [facility] [address] [place], [place] [address]    Culture   Final    NO GROWTH < 12 HOURS Performed at [facility] [address] [place],  [address]    Report Status PENDING  Incomplete         Radiology Studies: CT Head Wo Contrast  Result Date: 01/12/2022 CLINICAL DATA:  Head trauma, minor (Age >= 65y); Neck trauma (Age >= 65y) EXAM: CT  HEAD WITHOUT CONTRAST CT CERVICAL SPINE WITHOUT CONTRAST TECHNIQUE: Multidetector CT imaging of the head and cervical spine was performed following the standard protocol without intravenous contrast. Multiplanar CT image reconstructions of the cervical spine were also generated. RADIATION DOSE REDUCTION: This exam was performed according to the departmental dose-optimization program which includes automated exposure control, adjustment of the mA and/or kV according to patient size and/or use of iterative reconstruction technique. COMPARISON:  MRI of the cervical spine March 13, 2020. FINDINGS: CT HEAD FINDINGS Brain: No evidence of acute infarction, hemorrhage, hydrocephalus, extra-axial collection or mass lesion/mass effect. Remote lacunar infarcts in the  cerebellum, left thalamus and basal ganglia. Additional patchy white matter hypodensities, nonspecific but compatible with chronic microvascular ischemic disease. Vascular: Calcific atherosclerosis. Skull: No acute fracture. Sinuses/Orbits: Clear sinuses.  No acute orbital findings. Other: No mastoid effusions. CT CERVICAL SPINE FINDINGS Alignment: Degenerative anterolisthesis of C3 on C4, C4 on C5, similar to prior MRI. No new malalignment. Dextrocurvature. Skull base and vertebrae: No evidence of acute fracture. Soft tissues and spinal canal: Visualized lung apices are clear. Disc levels: Multilevel facet uncovertebral hypertrophy which contributes (along with malalignment) to probably severe foraminal stenosis at multiple levels, including C3-C4 and C4-C5. Upper chest: Visualized lung apices are clear. IMPRESSION: CT head: 1. No evidence of acute intracranial abnormality. 2. Chronic microvascular ischemic disease. CT cervical spine: 1. No evidence of acute fracture or traumatic malalignment. 2. Probably severe foraminal stenosis at C3-C4 and C4-C5. An MRI could further characterize if clinically warranted. Electronically Signed   By: Margaretha Sheffield M.D.    On: 01/12/2022 16:17   CT Cervical Spine Wo Contrast  Result Date: 01/12/2022 CLINICAL DATA:  Head trauma, minor (Age >= 65y); Neck trauma (Age >= 65y) EXAM: CT HEAD WITHOUT CONTRAST CT CERVICAL SPINE WITHOUT CONTRAST TECHNIQUE: Multidetector CT imaging of the head and cervical spine was performed following the standard protocol without intravenous contrast. Multiplanar CT image reconstructions of the cervical spine were also generated. RADIATION DOSE REDUCTION: This exam was performed according to the departmental dose-optimization program which includes automated exposure control, adjustment of the mA and/or kV according to patient size and/or use of iterative reconstruction technique. COMPARISON:  MRI of the cervical spine March 13, 2020. FINDINGS: CT HEAD FINDINGS Brain: No evidence of acute infarction, hemorrhage, hydrocephalus, extra-axial collection or mass lesion/mass effect. Remote lacunar infarcts in the cerebellum, left thalamus and basal ganglia. Additional patchy white matter hypodensities, nonspecific but compatible with chronic microvascular ischemic disease. Vascular: Calcific atherosclerosis. Skull: No acute fracture. Sinuses/Orbits: Clear sinuses.  No acute orbital findings. Other: No mastoid effusions. CT CERVICAL SPINE FINDINGS Alignment: Degenerative anterolisthesis of C3 on C4, C4 on C5, similar to prior MRI. No new malalignment. Dextrocurvature. Skull base and vertebrae: No evidence of acute fracture. Soft tissues and spinal canal: Visualized lung apices are clear. Disc levels: Multilevel facet uncovertebral hypertrophy which contributes (along with malalignment) to probably severe foraminal stenosis at multiple levels, including C3-C4 and C4-C5. Upper chest: Visualized lung apices are clear. IMPRESSION: CT head: 1. No evidence of acute intracranial abnormality. 2. Chronic microvascular ischemic disease. CT cervical spine: 1. No evidence of acute fracture or traumatic malalignment. 2.  Probably severe foraminal stenosis at C3-C4 and C4-C5. An MRI could further characterize if clinically warranted. Electronically Signed   By: Margaretha Sheffield M.D.   On: 01/12/2022 16:17   DG Hip Unilat W or Wo Pelvis 2-3 Views Left  Result Date: 01/12/2022 CLINICAL DATA:  Chronic wound. Pressure ulcer at the posterolateral left hip abutting the left femoral greater trochanter. Scheduled surgery 6 days from now. EXAM: DG HIP (WITH OR WITHOUT PELVIS) 2-3V LEFT COMPARISON:  Pelvis and left hip radiographs 12/11/2021; MRI pelvis without and with contrast 12/25/2021 FINDINGS: Mild to moderate bilateral superomedial femoroacetabular joint space narrowing. The bilateral sacroiliac and pubic symphysis joint spaces are maintained. Surgical clips overlie the right groin. Left common and external iliac arterial stent grafts. There are scattered air lucencies within the soft tissues just lateral to the left greater trochanter. Minimal lateral left greater trochanter cortical erosion as better seen on 12/25/2021 MRI. No acute fracture or dislocation.  Moderate to severe left L4-5 disc space narrowing with degenerative vacuum disc. IMPRESSION: 1. Scattered air lucencies within the soft tissues just lateral to the left greater trochanter consistent with the wound sinus tract seen on 12/25/2021 MRI. 2. Minimal lateral left greater trochanter cortical erosion as better seen on 12/25/2021 MRI where findings were concerning for osteomyelitis. Electronically Signed   By: Yvonne Kendall M.D.   On: 01/12/2022 15:52   DG Chest Portable 1 View  Result Date: 01/12/2022 CLINICAL DATA:  Chronic wound. Pressure ulcer of left hip/buttocks. Scheduled for surgery in 6 days. EXAM: PORTABLE CHEST 1 VIEW COMPARISON:  AP chest 09/05/2020 FINDINGS: Status post median sternotomy and CABG. Cardiac silhouette is at the upper limits of normal size. Mild-to-moderate calcification within the aortic arch. The lungs are clear. No pleural effusion or  pneumothorax. No acute skeletal abnormality. IMPRESSION: No acute cardiopulmonary disease. Electronically Signed   By: Yvonne Kendall M.D.   On: 01/12/2022 15:39        Scheduled Meds:  amLODipine  5 mg Oral Daily   aspirin EC  81 mg Oral Daily   atorvastatin  80 mg Oral Daily   carvedilol  25 mg Oral BID WC   irbesartan  300 mg Oral Daily   pantoprazole  40 mg Oral Daily   pregabalin  75 mg Oral TID   rivaroxaban  10 mg Oral QHS   sodium chloride  1 g Oral BID WC   Continuous Infusions:   LOS: 0 days   Time spent= 35 mins    Kevork Joyce Arsenio Loader, MD [facility]  If 7PM-7AM, please contact night-coverage  01/13/2022, 8:17 AM

## 2022-01-13 NOTE — Evaluation (Signed)
Physical Therapy Evaluation Patient Details Name: Caleb [name] MRN: [id number] DOB: Jul 19, 1950 Today's Date: 01/13/2022  History of Present Illness  Pt is a 71yo male presenting to [facility] ED on 12/29 reporting generalized weakness including difficulty standing, reporting falls. Imaging with no acute findings. PMH: HTN, CAD s/p CABG, PAD, CHF, SIADH, COPD, chronic pain with cervical spinal stenosis, L hip trochanteric osteomyelitis, R hallux amputation.  Clinical Impression  Pt received supine in bed reporting no pain and agreeable to be seen. Pt reports history of falls and generally feeling week requiring help from family to stand the last few days. Pt required max assist for bed mobility and min assist for transfers, ambulation was attempted with min assist but pt is unable to WB on LLE secondary to pain, advances RLE through sliding along floor. Discussed SNF-level therapies upon discharge, pt expressed preference to return home but is agreeable; pt does not have level of assist at home currently to make this a safe recommendation at this time. We will continue to follow acutely.     Recommendations for follow up therapy are one component of a multi-disciplinary discharge planning process, led by the attending physician.  Recommendations may be updated based on patient status, additional functional criteria and insurance authorization.  Follow Up Recommendations Skilled nursing-short term rehab (<3 hours/day) Can patient physically be transported by private vehicle: No    Assistance Recommended at Discharge Frequent or constant Supervision/Assistance  Patient can return home with the following  Assist for transportation;Help with stairs or ramp for entrance;Assistance with cooking/housework;A lot of help with bathing/dressing/bathroom;A lot of help with walking and/or transfers    Equipment Recommendations None recommended by PT (TBD)  Recommendations for Other Services       Functional  Status Assessment Patient has had a recent decline in their functional status and demonstrates the ability to make significant improvements in function in a reasonable and predictable amount of time.     Precautions / Restrictions Precautions Precautions: Fall Precaution Comments: hx of falling Restrictions Weight Bearing Restrictions: No Other Position/Activity Restrictions: wbat      Mobility  Bed Mobility Overal bed mobility: Needs Assistance Bed Mobility: Supine to Sit, Sit to Supine     Supine to sit: Min assist, HOB elevated Sit to supine: Max assist, HOB elevated   General bed mobility comments: Supine to sit: pt required min assist for trunk elevation and scooting hips EOB. Sit to supine: pt required max assist to bring hips up on to ED stretcher and BLE into stretcher. repositioned for comfort.    Transfers Overall transfer level: Needs assistance Equipment used: Rolling walker (2 wheels) Transfers: Sit to/from Stand Sit to Stand: Min assist           General transfer comment: Min assist for steadying of pt and RW.    Ambulation/Gait Ambulation/Gait assistance: Min assist Gait Distance (Feet): 2 Feet Assistive device: Rolling walker (2 wheels) Gait Pattern/deviations: Step-to pattern, Decreased stance time - right, Decreased stance time - left, Shuffle Gait velocity: decreased     General Gait Details: Pt attempted forward/backward ambulation but unable to weightbear on rLE to advance LLE. Pt able to scoot/slide RLE for sidesteps at EOB to adjust supine positioning in bed but no true steps were taken. Pt reporting high degree of pain and weakness.  Stairs            Wheelchair Mobility    Modified Rankin (Stroke Patients Only)       Balance Overall balance  assessment: Needs assistance Sitting-balance support: Feet supported, No upper extremity supported Sitting balance-Leahy Scale: Good     Standing balance support: Reliant on assistive  device for balance, During functional activity, Bilateral upper extremity supported Standing balance-Leahy Scale: Poor                               Pertinent Vitals/Pain Pain Assessment Pain Assessment: No/denies pain    Home Living Family/patient expects to be discharged to:: Private residence Living Arrangements: Children Available Help at Discharge: Family;Available PRN/intermittently Type of Home: Mobile home Home Access: Stairs to enter Entrance Stairs-Rails: Right Entrance Stairs-Number of Steps: 4   Home Layout: One level Home Equipment: Conservation officer, nature (2 wheels);Cane - single point Additional Comments: Daughter lives beside    Prior Function Prior Level of Function : Needs assist;History of Falls (last six months)       Physical Assist : Mobility (physical) Mobility (physical): Transfers   Mobility Comments: Reports hx of falling but does not elaborate, daughter helps him get up and down sometimes. HHPT 2x week but cannot report how recently last visit. ADLs Comments: IND     Hand Dominance        Extremity/Trunk Assessment   Upper Extremity Assessment Upper Extremity Assessment: Generalized weakness    Lower Extremity Assessment Lower Extremity Assessment: Generalized weakness    Cervical / Trunk Assessment Cervical / Trunk Assessment: Kyphotic  Communication   Communication: No difficulties  Cognition Arousal/Alertness: Awake/alert Behavior During Therapy: WFL for tasks assessed/performed Overall Cognitive Status: Within Functional Limits for tasks assessed                                          General Comments      Exercises     Assessment/Plan    PT Assessment Patient needs continued PT services  PT Problem List Decreased strength;Decreased range of motion;Decreased activity tolerance;Decreased balance;Decreased mobility;Decreased coordination;Pain       PT Treatment Interventions DME instruction;Gait  training;Stair training;Functional mobility training;Therapeutic activities;Therapeutic exercise;Balance training;Neuromuscular re-education;Patient/family education    PT Goals (Current goals can be found in the Care Plan section)  Acute Rehab PT Goals Patient Stated Goal: to go home PT Goal Formulation: With patient Time For Goal Achievement: 01/27/22 Potential to Achieve Goals: Fair    Frequency Min 2X/week     Co-evaluation               AM-PAC PT "6 Clicks" Mobility  Outcome Measure Help needed turning from your back to your side while in a flat bed without using bedrails?: A Little Help needed moving from lying on your back to sitting on the side of a flat bed without using bedrails?: A Lot Help needed moving to and from a bed to a chair (including a wheelchair)?: A Lot Help needed standing up from a chair using your arms (e.g., wheelchair or bedside chair)?: A Lot Help needed to walk in hospital room?: Total Help needed climbing 3-5 steps with a railing? : Total 6 Click Score: 11    End of Session Equipment Utilized During Treatment: Gait belt Activity Tolerance: Patient limited by fatigue;Patient limited by pain Patient left: in bed;with call bell/phone within reach (stretcher in ED) Nurse Communication: Mobility status PT Visit Diagnosis: Pain;Difficulty in walking, not elsewhere classified (R26.2);History of falling (Z91.81) Pain - Right/Left: Left Pain -  part of body: Hip    Time: 1030-1057 PT Time Calculation (min) (ACUTE ONLY): 27 min   Charges:   PT Evaluation $PT Eval Low Complexity: 1 Low PT Treatments $Therapeutic Activity: 8-22 mins        Coolidge Breeze, PT, DPT [facility] Rehabilitation Department Office: [phone] Weekend pager: [phone]  Coolidge Breeze 01/13/2022, 12:55 PM

## 2022-01-13 NOTE — Progress Notes (Signed)
PHARMACY - PHYSICIAN COMMUNICATION CRITICAL VALUE ALERT - BLOOD CULTURE IDENTIFICATION (BCID)  Caleb [name] is an 71 y.o. male who presented to [facility] on 01/12/2022 with a chief complaint of generalized weakness and falls.  Assessment: Currently being treated for UTI. Urine culture growing > 100K colonies/mL E.coli with susceptibilities pending. Note patient also with left hip ulcer/trochanteric osteomyelitis, has elective debridement scheduled at [facility], wound care ordered. Blood cultures 1/2 bottles with Staph species, no resistance detected.   Name of physician (or Provider) Contacted: Raenette Rover, NP  Current antibiotics: Ceftriaxone 1g IV q24h  Changes to prescribed antibiotics recommended:  Repeat blood cultures, probably a contaminant  Results for orders placed or performed during the hospital encounter of 01/12/22  Blood Culture ID Panel (Reflexed) (Collected: 01/12/2022  5:45 PM)  Result Value Ref Range   Enterococcus faecalis NOT DETECTED NOT DETECTED   Enterococcus Faecium NOT DETECTED NOT DETECTED   Listeria monocytogenes NOT DETECTED NOT DETECTED   Staphylococcus species DETECTED (A) NOT DETECTED   Staphylococcus aureus (BCID) NOT DETECTED NOT DETECTED   Staphylococcus epidermidis NOT DETECTED NOT DETECTED   Staphylococcus lugdunensis NOT DETECTED NOT DETECTED   Streptococcus species NOT DETECTED NOT DETECTED   Streptococcus agalactiae NOT DETECTED NOT DETECTED   Streptococcus pneumoniae NOT DETECTED NOT DETECTED   Streptococcus pyogenes NOT DETECTED NOT DETECTED   A.calcoaceticus-baumannii NOT DETECTED NOT DETECTED   Bacteroides fragilis NOT DETECTED NOT DETECTED   Enterobacterales NOT DETECTED NOT DETECTED   Enterobacter cloacae complex NOT DETECTED NOT DETECTED   Escherichia coli NOT DETECTED NOT DETECTED   Klebsiella aerogenes NOT DETECTED NOT DETECTED   Klebsiella oxytoca NOT DETECTED NOT DETECTED   Klebsiella pneumoniae NOT DETECTED NOT DETECTED   Proteus  species NOT DETECTED NOT DETECTED   Salmonella species NOT DETECTED NOT DETECTED   Serratia marcescens NOT DETECTED NOT DETECTED   Haemophilus influenzae NOT DETECTED NOT DETECTED   Neisseria meningitidis NOT DETECTED NOT DETECTED   Pseudomonas aeruginosa NOT DETECTED NOT DETECTED   Stenotrophomonas maltophilia NOT DETECTED NOT DETECTED   Candida albicans NOT DETECTED NOT DETECTED   Candida auris NOT DETECTED NOT DETECTED   Candida glabrata NOT DETECTED NOT DETECTED   Candida krusei NOT DETECTED NOT DETECTED   Candida parapsilosis NOT DETECTED NOT DETECTED   Candida tropicalis NOT DETECTED NOT DETECTED   Cryptococcus neoformans/gattii NOT DETECTED NOT DETECTED    Dolly Rias RPh 01/13/2022, 10:21 PM

## 2022-01-13 NOTE — Progress Notes (Incomplete)
PHARMACY - PHYSICIAN COMMUNICATION CRITICAL VALUE ALERT - BLOOD CULTURE IDENTIFICATION (BCID)  Caleb [name] is an 71 y.o. male who presented to [facility] on 01/12/2022 with a chief complaint of generalized weakness and falls.  Assessment: Currently being treated for UTI. Urine culture growing > 100K colonies/mL E.coli with susceptibilities pending. Note patient also with left hip ulcer/trochanteric osteomyelitis, has elective debridement scheduled at [facility], wound care ordered. Blood cultures 1/2 bottles with Staph species, no resistance detected.   Name of physician (or Provider) Contacted: Caleb Rover, NP  Current antibiotics: Ceftriaxone 1g IV q24h  Changes to prescribed antibiotics recommended:  {Plan:21268}  Results for orders placed or performed during the hospital encounter of 01/12/22  Blood Culture ID Panel (Reflexed) (Collected: 01/12/2022  5:45 PM)  Result Value Ref Range   Enterococcus faecalis NOT DETECTED NOT DETECTED   Enterococcus Faecium NOT DETECTED NOT DETECTED   Listeria monocytogenes NOT DETECTED NOT DETECTED   Staphylococcus species DETECTED (A) NOT DETECTED   Staphylococcus aureus (BCID) NOT DETECTED NOT DETECTED   Staphylococcus epidermidis NOT DETECTED NOT DETECTED   Staphylococcus lugdunensis NOT DETECTED NOT DETECTED   Streptococcus species NOT DETECTED NOT DETECTED   Streptococcus agalactiae NOT DETECTED NOT DETECTED   Streptococcus pneumoniae NOT DETECTED NOT DETECTED   Streptococcus pyogenes NOT DETECTED NOT DETECTED   A.calcoaceticus-baumannii NOT DETECTED NOT DETECTED   Bacteroides fragilis NOT DETECTED NOT DETECTED   Enterobacterales NOT DETECTED NOT DETECTED   Enterobacter cloacae complex NOT DETECTED NOT DETECTED   Escherichia coli NOT DETECTED NOT DETECTED   Klebsiella aerogenes NOT DETECTED NOT DETECTED   Klebsiella oxytoca NOT DETECTED NOT DETECTED   Klebsiella pneumoniae NOT DETECTED NOT DETECTED   Proteus species NOT DETECTED NOT DETECTED    Salmonella species NOT DETECTED NOT DETECTED   Serratia marcescens NOT DETECTED NOT DETECTED   Haemophilus influenzae NOT DETECTED NOT DETECTED   Neisseria meningitidis NOT DETECTED NOT DETECTED   Pseudomonas aeruginosa NOT DETECTED NOT DETECTED   Stenotrophomonas maltophilia NOT DETECTED NOT DETECTED   Candida albicans NOT DETECTED NOT DETECTED   Candida auris NOT DETECTED NOT DETECTED   Candida glabrata NOT DETECTED NOT DETECTED   Candida krusei NOT DETECTED NOT DETECTED   Candida parapsilosis NOT DETECTED NOT DETECTED   Candida tropicalis NOT DETECTED NOT DETECTED   Cryptococcus neoformans/gattii NOT DETECTED NOT DETECTED    Caleb [name] 01/13/2022  8:40 PM

## 2022-01-14 DIAGNOSIS — R531 Weakness: Secondary | ICD-10-CM | POA: Diagnosis not present

## 2022-01-14 DIAGNOSIS — R7881 Bacteremia: Secondary | ICD-10-CM | POA: Diagnosis present

## 2022-01-14 LAB — BASIC METABOLIC PANEL
Anion gap: 6 (ref 5–15)
BUN: 13 mg/dL (ref 8–23)
CO2: 25 mmol/L (ref 22–32)
Calcium: 8.4 mg/dL — ABNORMAL LOW (ref 8.9–10.3)
Chloride: 99 mmol/L (ref 98–111)
Creatinine, Ser: 0.97 mg/dL (ref 0.61–1.24)
GFR, Estimated: >60 mL/min (ref >60)
Glucose, Bld: 119 mg/dL — ABNORMAL HIGH (ref 70–99)
Potassium: 4.4 mmol/L (ref 3.5–5.1)
Sodium: 130 mmol/L — ABNORMAL LOW (ref 135–145)

## 2022-01-14 LAB — URINE CULTURE: Culture: >=100000 — AB

## 2022-01-14 LAB — BRAIN NATRIURETIC PEPTIDE: B Natriuretic Peptide: 551.4 pg/mL — ABNORMAL HIGH (ref 0.0–100.0)

## 2022-01-14 LAB — PROCALCITONIN: Procalcitonin: 5.3 ng/mL

## 2022-01-14 LAB — VITAMIN D 25 HYDROXY (VIT D DEFICIENCY, FRACTURES): Vit D, 25-Hydroxy: 72.44 ng/mL (ref 30–100)

## 2022-01-14 LAB — MAGNESIUM: Magnesium: 1.9 mg/dL (ref 1.7–2.4)

## 2022-01-14 MED ORDER — VANCOMYCIN HCL 1250 MG/250ML IV SOLN
1250.0000 mg | INTRAVENOUS | Status: DC
  Administered 2022-01-14 – 2022-01-15 (×2): 1250 mg via INTRAVENOUS
  Filled 2022-01-14 (×3): qty 250

## 2022-01-14 MED ORDER — DAKINS (1/4 STRENGTH) 0.125 % EX SOLN
Freq: Two times a day (BID) | CUTANEOUS | Status: DC
  Administered 2022-01-14 – 2022-01-15 (×2): 1
  Filled 2022-01-14: qty 473

## 2022-01-14 NOTE — Plan of Care (Signed)
  Problem: Education: Goal: Knowledge of General Education information will improve Description: Including pain rating scale, medication(s)/side effects and non-pharmacologic comfort measures Outcome: Progressing   Problem: Activity: Goal: Risk for activity intolerance will decrease Outcome: Progressing   Problem: Pain Managment: Goal: General experience of comfort will improve Outcome: Progressing   

## 2022-01-14 NOTE — Progress Notes (Signed)
[facility] Progress Note Patient: Caleb [name] XBJ:[id number] DOB: [birth year] DOA: 01/12/2022  DOS: the patient was seen and examined on 01/14/2022  Brief hospital course: 71 y.o. male with medical history significant for hypertension, CAD status post CABG, PAD status post right femoral to popliteal bypass and bilateral iliac stenting, chronic HFmrEF, SIADH, COPD, cervical spinal stenosis with chronic pain, and left hip ulcer with trochanteric osteomyelitis who presents to the emergency department with generalized weakness and falls. Patient typically ambulates with a walker but has had increasing difficulty with this due to generalized weakness over the past few days.  In the ED CT head did not show any acute findings, chest x-ray was unremarkable.  Assessment and Plan: Bacteremia. Left hip ulcer. Trochanteric osteomyelitis. Patient has 1 out of 2 cultures positive for coagulase-negative staph. Recently was identified to have an osteomyelitis of his left trochanter with surrounding cellulitis. Follows up with [facility] ID as well as wound care. Plan was for surgical debridement and biopsy of the bone on 01/18/2022. Currently his blood culture is growing bacteria and procalcitonin is elevated at which is highly concerning for a bacterial illness. Discussed with ID recommend to transition to IV vancomycin and monitor.  We repeated another culture and will monitor response to that.  Possible UTI. Treated with Rocephin. Patient does not have any urinary symptoms.  Asymptomatic bacteriuria.  No antibiotics for now.  Upper respiratory infection. While the viral panel is negative patient suspected to have viral infection. Patient was on prednisone but due to his osteomyelitis I will discontinue the medication. Continue supportive care.  Generalized weakness. Frequent falls. Patient has multiple etiologies for which she has history of frequent fall. PT OT recommended SNF. Will monitor  progression.  Cervical stenosis. Chronic pain syndrome. Patient has chronic myelopathy. Not a candidate for surgery. PT OT recommendation.  History of PAD. History of CAD. Currently aspirin and Lipitor.  Continue.  Chronic anticoagulation. Patient is chronically on Xarelto. Will not continue.  Chronic SIADH. Sodium level stable. Monitor.  Subjective: More fatigued and tired.  Also had shortness of breath and cough.  Physical Exam: General: in Mild distress, No Rash Cardiovascular: S1 and S2 Present, No Murmur Respiratory: Good respiratory effort, Bilateral Air entry present.  Bilateral crackles, bilateral expiratory wheezes Abdomen: Bowel Sound present, No tenderness Extremities: Bilateral edema Neuro: Alert and oriented x3, no new focal deficit   Data Reviewed: I have Reviewed nursing notes, Vitals, and Lab results. Since last encounter, pertinent lab results CBC and BMP   . I have ordered test including CBC and BMP and blood culture  . I have discussed pt's care plan and test results with ID  .   Disposition: Status is: Inpatient Remains inpatient appropriate because: Need for antibiotics  rivaroxaban (XARELTO) tablet 10 mg Start: 01/12/22 2200 rivaroxaban (XARELTO) tablet 10 mg   Family Communication: Daughter at bedside Level of care: Med-Surg  Vitals:   01/14/22 0154 01/14/22 0530 01/14/22 1406 01/14/22 1500  BP: (!) 154/62 (!) 156/56 (!) 131/40   Pulse: 65 63 61 64  Resp: '14 14 20   '$ Temp:  98.2 F (36.8 C) 98.8 F (37.1 C)   TempSrc:  Oral    SpO2: 96% 96% 91%   Weight:      Height:         Author: Berle Mull, MD 01/14/2022 6:19 PM  Please look on [email or web address] to find out who is on call.

## 2022-01-14 NOTE — Plan of Care (Signed)
Plan of care reviewed and discussed. °

## 2022-01-14 NOTE — Progress Notes (Signed)
Pharmacy Antibiotic Note  Caleb [name] is a 71 y.o. male admitted on 01/12/2022 with bacteremia.  Pharmacy has been consulted for vancomycin dosing.  Plan: Vancomycin 1250 kmg IV q24h ( AUC 530, SCr 0.97 mg/dl, wt 65.9 kg )  Monitor clinical course, renal function, cultures as available   Height: '5\' 3"'$  (160 cm) Weight: 65.9 kg (145 lb 4.5 oz) IBW/kg (Calculated) : 56.9  Temp (24hrs), Avg:98.4 F (36.9 C), Min:98 F (36.7 C), Max:98.8 F (37.1 C)  Recent Labs  Lab 01/12/22 1520 01/12/22 1745 01/13/22 0500 01/14/22 0336  WBC 12.7*  --  12.0*  --   CREATININE 0.75  --  0.86 0.97  LATICACIDVEN  --  1.0  --   --     Estimated Creatinine Clearance: 54.6 mL/min (by C-G formula based on SCr of 0.97 mg/dL).    Allergies  Allergen Reactions   Bactrim [Sulfamethoxazole-Trimethoprim] Other (See Comments)    "Made potassium go high and sodium go low"   Lisinopril Cough and Other (See Comments)   Losartan Swelling    Pt's daughter stated it made pt's blood pressure and sodium too low, but pt has been tolerating irbesartan.   Pletal [Cilostazol] Swelling and Other (See Comments)    Site of swelling not recalled by the patient    Antimicrobials this admission: 12/30 ceftriaxone >> 12/31 12/31 vancomycin >>   Dose adjustments this admission:   Microbiology results: 12/29 BCx: 1/2 bottles show Staph Species  12/29 UCx: E.coli pansen 12/30 Resp Panel: negative  12/31 BCx:   Thank you for allowing pharmacy to be a part of this patient's care.  Royetta Asal, PharmD, BCPS 01/14/2022 3:16 PM

## 2022-01-14 NOTE — Evaluation (Signed)
Occupational Therapy Evaluation Patient Details Name: Caleb [name] MRN: [id number] DOB: March 31, 1950 Today's Date: 01/14/2022   History of Present Illness Pt is a 71yo male presenting to [facility] ED on 12/29 reporting generalized weakness including difficulty standing, reporting falls. Imaging with no acute findings. PMH: HTN, CAD s/p CABG, PAD, CHF, SIADH, COPD, chronic pain with cervical spinal stenosis, L hip trochanteric osteomyelitis, R hallux amputation.   Clinical Impression   Mr. Caleb [name] is a 71 year old man who presents with above medical history. On evaluation he presents with generalized weakness, decreased activity tolerance and impaired balance as well as pain in left hip. He was supervision to transfer to edge of bed and min guard to take steps to recliner. He is overall setup to mod assist for ADLs with LB dressing being the most difficulty due to left hip pain. Patient will benefit from skilled OT services while in hospital to improve deficits and learn compensatory strategies as needed in order to return to PLOF.  Patient lives alone and needs to be modified independent. Current recommendation is for SNF.       Recommendations for follow up therapy are one component of a multi-disciplinary discharge planning process, led by the attending physician.  Recommendations may be updated based on patient status, additional functional criteria and insurance authorization.   Follow Up Recommendations  Skilled nursing-short term rehab (<3 hours/day)     Assistance Recommended at Discharge Intermittent Supervision/Assistance  Patient can return home with the following A little help with walking and/or transfers;A little help with bathing/dressing/bathroom;Assistance with cooking/housework;Help with stairs or ramp for entrance    Functional Status Assessment  Patient has had a recent decline in their functional status and demonstrates the ability to make significant improvements in function in a  reasonable and predictable amount of time.  Equipment Recommendations  None recommended by OT    Recommendations for Other Services       Precautions / Restrictions Precautions Precautions: Fall Precaution Comments: hx of falling, left hip wound/drainage Restrictions Weight Bearing Restrictions: No Other Position/Activity Restrictions: wbat      Mobility Bed Mobility Overal bed mobility: Needs Assistance Bed Mobility: Supine to Sit     Supine to sit: Supervision          Transfers Overall transfer level: Needs assistance Equipment used: Rolling walker (2 wheels) Transfers: Sit to/from Stand, Bed to chair/wheelchair/BSC Sit to Stand: Min guard     Step pivot transfers: Min guard     General transfer comment: effort with ambulation and mildly unsteady      Balance Overall balance assessment: Needs assistance Sitting-balance support: No upper extremity supported, Feet supported Sitting balance-Leahy Scale: Good     Standing balance support: During functional activity, Reliant on assistive device for balance Standing balance-Leahy Scale: Poor                             ADL either performed or assessed with clinical judgement   ADL Overall ADL's : Needs assistance/impaired Eating/Feeding: Independent   Grooming: Set up;Sitting   Upper Body Bathing: Set up;Sitting   Lower Body Bathing: Minimal assistance;Sit to/from stand   Upper Body Dressing : Set up;Sitting Upper Body Dressing Details (indicate cue type and reason): no fasteners Lower Body Dressing: Moderate assistance;Sit to/from stand   Toilet Transfer: Min Pension scheme manager (2 wheels)   Toileting- Water quality scientist and Hygiene: Min guard;Sitting/lateral lean;Sit to/from stand  Functional mobility during ADLs: Min guard;Rolling walker (2 wheels)       Vision Patient Visual Report: No change from baseline       Perception     Praxis      Pertinent  Vitals/Pain Pain Assessment Pain Assessment: No/denies pain     Hand Dominance     Extremity/Trunk Assessment Upper Extremity Assessment Upper Extremity Assessment: RUE deficits/detail;LUE deficits/detail RUE Deficits / Details: WFL ROM, grossly 5/5 strength RUE Sensation: history of peripheral neuropathy LUE Deficits / Details: WFL ROM, groslsy 5/5 strength LUE Sensation: history of peripheral neuropathy   Lower Extremity Assessment Lower Extremity Assessment: Defer to PT evaluation   Cervical / Trunk Assessment Cervical / Trunk Assessment: Kyphotic   Communication Communication Communication: No difficulties   Cognition Arousal/Alertness: Awake/alert Behavior During Therapy: WFL for tasks assessed/performed Overall Cognitive Status: Within Functional Limits for tasks assessed                                       General Comments       Exercises     Shoulder Instructions      Home Living Family/patient expects to be discharged to:: Private residence Living Arrangements: Children Available Help at Discharge: Family;Available PRN/intermittently Type of Home: Mobile home Home Access: Stairs to enter Entrance Stairs-Number of Steps: 4 Entrance Stairs-Rails: Right Home Layout: One level     Bathroom Shower/Tub: Tub/shower unit;Walk-in shower   Bathroom Toilet: Standard Bathroom Accessibility: Yes   Home Equipment: Conservation officer, nature (2 wheels);Cane - single point   Additional Comments: Daughter lives beside      Prior Functioning/Environment Prior Level of Function : Needs assist;History of Falls (last six months)       Physical Assist : Mobility (physical) Mobility (physical): Transfers   Mobility Comments: Reports hx of falling but does not elaborate, daughter helps him get up and down sometimes. HHPT 2x week but cannot report how recently last visit. ADLs Comments: IND, difficulty with socks, sink baths        OT Problem List:  Decreased strength;Decreased range of motion;Impaired balance (sitting and/or standing);Decreased knowledge of use of DME or AE;Impaired sensation;Pain      OT Treatment/Interventions: Self-care/ADL training;Therapeutic exercise;DME and/or AE instruction;Therapeutic activities;Balance training;Patient/family education    OT Goals(Current goals can be found in the care plan section) Acute Rehab OT Goals Patient Stated Goal: get stronger so he can have his surgery OT Goal Formulation: With patient Time For Goal Achievement: 01/29/22 Potential to Achieve Goals: Good  OT Frequency: Min 2X/week    Co-evaluation              AM-PAC OT "6 Clicks" Daily Activity     Outcome Measure Help from another person eating meals?: None Help from another person taking care of personal grooming?: A Little Help from another person toileting, which includes using toliet, bedpan, or urinal?: A Little Help from another person bathing (including washing, rinsing, drying)?: A Little Help from another person to put on and taking off regular upper body clothing?: A Little Help from another person to put on and taking off regular lower body clothing?: A Lot 6 Click Score: 18   End of Session Equipment Utilized During Treatment: Gait belt;Rolling walker (2 wheels) Nurse Communication: Mobility status  Activity Tolerance: Patient tolerated treatment well Patient left: in chair;with call bell/phone within reach;with chair alarm set  OT Visit Diagnosis: Muscle weakness (generalized) (  M62.81)                Time: 5366-4403 OT Time Calculation (min): 17 min Charges:  OT General Charges $OT Visit: 1 Visit OT Evaluation $OT Eval Low Complexity: 1 Low  Gustavo Lah, OTR/L [place]  Office [phone]   Lenward Chancellor 01/14/2022, 3:15 PM

## 2022-01-14 NOTE — Hospital Course (Signed)
71 y.o. male with medical history significant for hypertension, CAD status post CABG, PAD status post right femoral to popliteal bypass and bilateral iliac stenting, chronic HFmrEF, SIADH, COPD, cervical spinal stenosis with chronic pain, and left hip ulcer with trochanteric osteomyelitis who presents to the emergency department with generalized weakness and falls. Patient typically ambulates with a walker but has had increasing difficulty with this due to generalized weakness over the past few days.  In the ED CT head did not show any acute findings, chest x-ray was unremarkable.  Started on IV ceftriaxone for concern for UTI.  Urine culture grew E. coli.  Antibiotics discontinued after 2 days as there were no symptoms. Started on IV vancomycin due to positive blood culture on admission for coagulase-negative staph.  Repeat blood cultures performed before initiation of vancomycin, so far negative after 48 hours.  Based on prior discussion with the ID vancomycin is now stopped. Awaiting discussion with the patient's primary wound care provider Dr. Lovett Calender with regards to disposition.  Currently monitor off of antibiotics.

## 2022-01-15 DIAGNOSIS — R7881 Bacteremia: Secondary | ICD-10-CM | POA: Diagnosis not present

## 2022-01-15 DIAGNOSIS — W1830XA Fall on same level, unspecified, initial encounter: Secondary | ICD-10-CM | POA: Diagnosis not present

## 2022-01-15 DIAGNOSIS — J449 Chronic obstructive pulmonary disease, unspecified: Secondary | ICD-10-CM | POA: Diagnosis not present

## 2022-01-15 DIAGNOSIS — E559 Vitamin D deficiency, unspecified: Secondary | ICD-10-CM | POA: Diagnosis not present

## 2022-01-15 DIAGNOSIS — Y999 Unspecified external cause status: Secondary | ICD-10-CM | POA: Diagnosis not present

## 2022-01-15 DIAGNOSIS — M4802 Spinal stenosis, cervical region: Secondary | ICD-10-CM | POA: Diagnosis not present

## 2022-01-15 DIAGNOSIS — Y939 Activity, unspecified: Secondary | ICD-10-CM | POA: Diagnosis not present

## 2022-01-15 DIAGNOSIS — I110 Hypertensive heart disease with heart failure: Secondary | ICD-10-CM | POA: Diagnosis not present

## 2022-01-15 DIAGNOSIS — N3000 Acute cystitis without hematuria: Secondary | ICD-10-CM | POA: Diagnosis not present

## 2022-01-15 DIAGNOSIS — R531 Weakness: Secondary | ICD-10-CM | POA: Diagnosis present

## 2022-01-15 DIAGNOSIS — S0990XA Unspecified injury of head, initial encounter: Secondary | ICD-10-CM | POA: Diagnosis not present

## 2022-01-15 DIAGNOSIS — I2510 Atherosclerotic heart disease of native coronary artery without angina pectoris: Secondary | ICD-10-CM | POA: Diagnosis not present

## 2022-01-15 DIAGNOSIS — Y929 Unspecified place or not applicable: Secondary | ICD-10-CM | POA: Diagnosis not present

## 2022-01-15 DIAGNOSIS — M869 Osteomyelitis, unspecified: Secondary | ICD-10-CM | POA: Diagnosis not present

## 2022-01-15 DIAGNOSIS — I5032 Chronic diastolic (congestive) heart failure: Secondary | ICD-10-CM | POA: Diagnosis not present

## 2022-01-15 DIAGNOSIS — Z79899 Other long term (current) drug therapy: Secondary | ICD-10-CM | POA: Diagnosis not present

## 2022-01-15 DIAGNOSIS — L89229 Pressure ulcer of left hip, unspecified stage: Secondary | ICD-10-CM | POA: Diagnosis not present

## 2022-01-15 DIAGNOSIS — Z1152 Encounter for screening for COVID-19: Secondary | ICD-10-CM | POA: Diagnosis not present

## 2022-01-15 LAB — BASIC METABOLIC PANEL
Anion gap: 6 (ref 5–15)
BUN: 15 mg/dL (ref 8–23)
CO2: 23 mmol/L (ref 22–32)
Calcium: 7.9 mg/dL — ABNORMAL LOW (ref 8.9–10.3)
Chloride: 100 mmol/L (ref 98–111)
Creatinine, Ser: 0.87 mg/dL (ref 0.61–1.24)
GFR, Estimated: >60 mL/min (ref >60)
Glucose, Bld: 88 mg/dL (ref 70–99)
Potassium: 3.8 mmol/L (ref 3.5–5.1)
Sodium: 129 mmol/L — ABNORMAL LOW (ref 135–145)

## 2022-01-15 LAB — CULTURE, BLOOD (ROUTINE X 2)

## 2022-01-15 LAB — C-REACTIVE PROTEIN: CRP: 2.6 mg/dL — ABNORMAL HIGH (ref <1.0)

## 2022-01-15 LAB — MAGNESIUM: Magnesium: 1.8 mg/dL (ref 1.7–2.4)

## 2022-01-15 LAB — PROCALCITONIN: Procalcitonin: 1.59 ng/mL

## 2022-01-15 LAB — SEDIMENTATION RATE: Sed Rate: 80 mm/hr — ABNORMAL HIGH (ref 0–16)

## 2022-01-15 MED ORDER — THIAMINE MONONITRATE 100 MG PO TABS
100.0000 mg | ORAL_TABLET | Freq: Every day | ORAL | Status: DC
  Administered 2022-01-15 – 2022-01-20 (×6): 100 mg via ORAL
  Filled 2022-01-15 (×8): qty 1

## 2022-01-15 MED ORDER — FOLIC ACID 1 MG PO TABS
1.0000 mg | ORAL_TABLET | Freq: Every day | ORAL | Status: DC
  Administered 2022-01-15 – 2022-01-20 (×6): 1 mg via ORAL
  Filled 2022-01-15 (×6): qty 1

## 2022-01-15 MED ORDER — TAMSULOSIN HCL 0.4 MG PO CAPS
0.4000 mg | ORAL_CAPSULE | Freq: Every evening | ORAL | Status: DC
  Administered 2022-01-15 – 2022-01-19 (×5): 0.4 mg via ORAL
  Filled 2022-01-15 (×5): qty 1

## 2022-01-15 MED ORDER — ENOXAPARIN SODIUM 40 MG/0.4ML IJ SOSY
40.0000 mg | PREFILLED_SYRINGE | INTRAMUSCULAR | Status: DC
  Administered 2022-01-15 – 2022-01-16 (×2): 40 mg via SUBCUTANEOUS
  Filled 2022-01-15 (×2): qty 0.4

## 2022-01-15 MED ORDER — VITAMIN B-12 1000 MCG PO TABS
1000.0000 ug | ORAL_TABLET | Freq: Every day | ORAL | Status: DC
  Administered 2022-01-15 – 2022-01-20 (×6): 1000 ug via ORAL
  Filled 2022-01-15 (×6): qty 1

## 2022-01-15 MED ORDER — FERROUS SULFATE 325 (65 FE) MG PO TABS
325.0000 mg | ORAL_TABLET | Freq: Every day | ORAL | Status: DC
  Administered 2022-01-15 – 2022-01-20 (×6): 325 mg via ORAL
  Filled 2022-01-15 (×6): qty 1

## 2022-01-15 NOTE — Plan of Care (Signed)

## 2022-01-15 NOTE — Progress Notes (Signed)
[facility] Progress Note Patient: Caleb [name] OHY:[id number] DOB: [birth year] DOA: 01/12/2022  DOS: the patient was seen and examined on 01/15/2022  Brief hospital course: 72 y.o. male with medical history significant for hypertension, CAD status post CABG, PAD status post right femoral to popliteal bypass and bilateral iliac stenting, chronic HFmrEF, SIADH, COPD, cervical spinal stenosis with chronic pain, and left hip ulcer with trochanteric osteomyelitis who presents to the emergency department with generalized weakness and falls. Patient typically ambulates with a walker but has had increasing difficulty with this due to generalized weakness over the past few days.  In the ED CT head did not show any acute findings, chest x-ray was unremarkable.  Assessment and Plan: Bacteremia. Left hip ulcer. Trochanteric osteomyelitis. Patient has 1 out of 2 cultures positive for coagulase-negative staph. Recently was identified to have an osteomyelitis of his left trochanter with surrounding cellulitis. Follows up with [facility] ID as well as wound care. Plan was for surgical debridement and biopsy of the bone on 01/18/2022. Currently his blood culture is growing bacteria and procalcitonin is elevated at which is highly concerning for a bacterial illness. Discussed with ID here, recommend to transition to IV vancomycin and monitor.  If the blood culture is negative for 48 hours discontinue IV vancomycin. We repeated another culture and will monitor response to that. Will discuss with patient's plastic surgery team tomorrow with regards to plan for surgery.  Possible UTI. Treated with Rocephin. Patient does not have any urinary symptoms.  Asymptomatic bacteriuria.  No antibiotics for now.  Received 2 days of antibiotic.  Upper respiratory infection. While the viral panel is negative patient suspected to have viral infection. Patient was on prednisone but due to his osteomyelitis I will discontinue the  medication. Continue supportive care.  Generalized weakness. Frequent falls. Patient has multiple etiologies for which she has history of frequent fall. PT OT recommended SNF. Will monitor progression.  Cervical stenosis. Chronic pain syndrome. Patient has chronic myelopathy. Not a candidate for surgery. PT OT recommendation.  History of PAD. History of CAD. Currently aspirin and Lipitor.  Continue.  Chronic anticoagulation. Patient is chronically on Xarelto.  Appears to be a DVT prophylaxis dose Will not continue.  And switch to Lovenox for DVT prophylaxis, for surgery  Chronic SIADH. Sodium level stable. Monitor.  Subjective: Feels about the same as yesterday.  Continues to have cough, continues to have shortness of breath.  No nausea no vomiting.  Still has fatigue.  Still has pain in his hip.  Physical Exam: General: in Mild distress, No Rash Cardiovascular: S1 and S2 Present, No Murmur Respiratory: Good respiratory effort, Bilateral Air entry present.  Bilateral crackles, bilateral unchanged wheezes Abdomen: Bowel Sound present, No tenderness Extremities: No edema Neuro: Alert and oriented x3, no new focal deficit  Data Reviewed: I have Reviewed nursing notes, Vitals, and Lab results. Since last encounter, pertinent lab results CBC and BMP   . I have ordered test including CBC BMP procalcitonin level CRP.  [name]    Disposition: Status is: Inpatient Remains inpatient appropriate because: Need for antibiotics  enoxaparin (LOVENOX) injection 40 mg Start: 01/15/22 1915   Family Communication: No one at bedside Level of care: Med-Surg  Vitals:   01/14/22 1500 01/14/22 2027 01/15/22 0606 01/15/22 1428  BP:  (!) 129/50 (!) 162/60 (!) 146/55  Pulse: 64 (!) 52 60 (!) 57  Resp:  '17 17 17  '$ Temp:  97.7 F (36.5 C) 98.7 F (37.1 C) 98.6 F (37 C)  TempSrc:  Oral Oral   SpO2:  99% 94% 90%  Weight:      Height:         Author: Berle Mull, MD 01/15/2022 6:15  PM  Please look on [email or web address] to find out who is on call.

## 2022-01-15 NOTE — Plan of Care (Signed)
  Problem: Coping: Goal: Level of anxiety will decrease Outcome: Progressing   Problem: Pain Managment: Goal: General experience of comfort will improve Outcome: Progressing   Problem: Safety: Goal: Ability to remain free from injury will improve Outcome: Progressing   

## 2022-01-15 NOTE — Consult Note (Signed)
[facility] Nurse Consult Note: Remote consult completed with review of the EHR and photo in media tab Reason for Consult: Left hip chronic wound.   Followed at [facility]. Has been treating with bleach solution made at home and has used Dakins in the past.  Wound type:infectious  Pressure Injury POA: NA Measurement:bedside RN to obtain measurements and add to flow sheet Wound GTX:MIWOEH to visualize Drainage (amount, consistency, odor) moderate purulence Periwound:Edema, erythema and induration Medical team to collaborate with plastics on need for surgical management.  Will implement topical orders for bedside staff guidance.  Any surgical orders will supercede WOC interventions.  Dressing procedure/placement/frequency: Cleanse left hip wound with and pat dry.  Cut long thin strip of Aquacel ([facility] #[id number]) (cut 4x4 square in cinnamon bun spiral fashion to make strip long enough to pack if needed) Cover with gauze and ABD pad/tape.  Will not follow at this time.  Please re-consult if needed.  Estrellita Ludwig MSN, RN, FNP-BC CWON Wound, Ostomy, Continence Nurse [facility] [phone] Pager [phone]

## 2022-01-15 NOTE — TOC Initial Note (Signed)
Transition of Care [facility]) - Initial/Assessment Note   Patient Details  Name: Caleb [name] MRN: [id number] Date of Birth: [birth year]  [facility] of [facility] CM/SW Contact:    Sherie Don, LCSW Phone Number: 01/15/2022, 3:21 PM  Clinical Narrative: PT evaluation recommended SNF. CSW met with patient to discuss recommendations. Patient is agreeable to SNF, but reported he is expected to have surgery at another hospital in a few days and requested that CSW follow up with his daughter. CSW spoke with patient's daughter, Lestine Box, regarding recommendations and daughter reported the surgery may happen this week, but some tests were pending that would determine if it would proceed. TOC to follow.  Expected Discharge Plan: Skilled Nursing Facility Barriers to Discharge: Continued Medical Work up  Patient Goals and CMS Choice Patient states their goals for this hospitalization and ongoing recovery are:: Get surgery CMS Medicare.gov Compare Post Acute Care list provided to:: Patient Choice offered to / list presented to : Patient  Expected Discharge Plan and Services In-house Referral: Clinical Social Work Post Acute Care Choice: [place] Living arrangements for the past 2 months: Mobile Home           DME Arranged: N/A DME Agency: NA  Prior Living Arrangements/Services Living arrangements for the past 2 months: Mobile Home Patient language and need for interpreter reviewed:: Yes Do you feel safe going back to the place where you live?: Yes      Need for Family Participation in Patient Care: Yes (Comment) Care giver support system in place?: Yes (comment) Criminal Activity/Legal Involvement Pertinent to Current Situation/Hospitalization: No - Comment as needed  Activities of Daily Living Home Assistive Devices/Equipment: Cane (specify quad or straight), Walker (specify type) ADL Screening (condition at time of admission) Patient's cognitive ability adequate to safely  complete daily activities?: Yes Is the patient deaf or have difficulty hearing?: No Does the patient have difficulty seeing, even when wearing glasses/contacts?: No Does the patient have difficulty concentrating, remembering, or making decisions?: No Patient able to express need for assistance with ADLs?: Yes Does the patient have difficulty dressing or bathing?: No Independently performs ADLs?: Yes (appropriate for developmental age) Does the patient have difficulty walking or climbing stairs?: No Weakness of Legs: Left Weakness of Arms/Hands: None  Permission Sought/Granted Permission sought to share information with : Family Supports Permission granted to share information with : Yes, Verbal Permission Granted Share Information with NAME: Lestine Box (daughter)  Emotional Assessment Appearance:: Appears stated age Attitude/Demeanor/Rapport: Engaged Affect (typically observed): Accepting Orientation: : Oriented to Self, Oriented to Place, Oriented to  Time, Oriented to Situation Alcohol / Substance Use: Not Applicable Psych Involvement: No (comment)  Admission diagnosis:  Acute cystitis [N30.00] Weakness [R53.1] Acute cystitis without hematuria [N30.00] Minor head injury, initial encounter [S09.90XA] Bacteremia [R78.81] Patient Active Problem List   Diagnosis Date Noted   Bacteremia 01/14/2022   Acute cystitis 01/12/2022   Cervical spinal stenosis 01/12/2022   CAD (coronary artery disease) 01/12/2022   Chronic osteomyelitis ([place]) 01/12/2022   Decubitus ulcer of left hip 01/12/2022   General weakness 01/12/2022   Malnutrition of moderate degree 09/09/2020   Hyponatremia 09/05/2020   Spinal stenosis of cervical region 04/21/2020   Monoclonal gammopathy 04/13/2020   SIRS (systemic inflammatory response syndrome) ([place]) 08/07/2019   Demand ischemia    Gastritis with hemorrhage 02/03/2019   PVD (peripheral vascular disease) ([place]) 02/03/2019   Chronic anticoagulation  02/03/2019   Acute kidney injury ([place]) 08/14/2018   Iron deficiency anemia  08/14/2018   SIADH (syndrome of inappropriate ADH production) ([place]) 08/13/2018   Renal artery stenosis ([place]) 10/22/2017   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   Heart failure with mildly reduced ejection fraction (HFmrEF) (HCC)    Tobacco abuse    Essential hypertension    Hypercholesteremia    Lactic acidosis 02/12/2014   Sinus tachycardia 02/12/2014   Acute respiratory failure with hypoxia (HCC)    Elevated troponin    COPD (chronic obstructive pulmonary disease) ([place]) 02/11/2014   COPD with acute exacerbation ([place]) 02/11/2014   Abnormal EKG 25/74/9355   Acute systolic (congestive) heart failure ([place]) 02/11/2014   PCP:  Percell Belt, DO Pharmacy:   [facility] [address] [place] - [address] [place] [place] [address] Phone: [phone] Fax: [phone]  [facility] [place], [place] [address] Phone: [phone] Fax: [phone]  Social Determinants of Health (SDOH) Social History: [place]: Low Risk  (01/13/2022)  Tobacco Use: High Risk (01/12/2022)   SDOH Interventions:    Readmission Risk Interventions     No data to display

## 2022-01-16 DIAGNOSIS — R531 Weakness: Secondary | ICD-10-CM | POA: Diagnosis not present

## 2022-01-16 LAB — CBC
HCT: 29.3 % — ABNORMAL LOW (ref 39.0–52.0)
Hemoglobin: 9.6 g/dL — ABNORMAL LOW (ref 13.0–17.0)
MCH: 32.7 pg (ref 26.0–34.0)
MCHC: 32.8 g/dL (ref 30.0–36.0)
MCV: 99.7 fL (ref 80.0–100.0)
Platelets: 212 10*3/uL (ref 150–400)
RBC: 2.94 MIL/uL — ABNORMAL LOW (ref 4.22–5.81)
RDW: 13 % (ref 11.5–15.5)
WBC: 6.6 10*3/uL (ref 4.0–10.5)
nRBC: 0 % (ref 0.0–0.2)

## 2022-01-16 LAB — MAGNESIUM: Magnesium: 1.9 mg/dL (ref 1.7–2.4)

## 2022-01-16 LAB — BASIC METABOLIC PANEL
Anion gap: 6 (ref 5–15)
BUN: 14 mg/dL (ref 8–23)
CO2: 24 mmol/L (ref 22–32)
Calcium: 8.2 mg/dL — ABNORMAL LOW (ref 8.9–10.3)
Chloride: 104 mmol/L (ref 98–111)
Creatinine, Ser: 0.84 mg/dL (ref 0.61–1.24)
GFR, Estimated: >60 mL/min (ref >60)
Glucose, Bld: 144 mg/dL — ABNORMAL HIGH (ref 70–99)
Potassium: 4 mmol/L (ref 3.5–5.1)
Sodium: 134 mmol/L — ABNORMAL LOW (ref 135–145)

## 2022-01-16 LAB — PROCALCITONIN: Procalcitonin: 0.85 ng/mL

## 2022-01-16 LAB — SEDIMENTATION RATE: Sed Rate: 67 mm/hr — ABNORMAL HIGH (ref 0–16)

## 2022-01-16 LAB — C-REACTIVE PROTEIN: CRP: 1.9 mg/dL — ABNORMAL HIGH (ref <1.0)

## 2022-01-16 MED ORDER — DEXTROMETHORPHAN POLISTIREX ER 30 MG/5ML PO SUER
30.0000 mg | Freq: Two times a day (BID) | ORAL | Status: DC
  Administered 2022-01-16 – 2022-01-20 (×9): 30 mg via ORAL
  Filled 2022-01-16 (×10): qty 5

## 2022-01-16 MED ORDER — GUAIFENESIN ER 600 MG PO TB12
1200.0000 mg | ORAL_TABLET | Freq: Two times a day (BID) | ORAL | Status: DC
  Administered 2022-01-16 – 2022-01-20 (×9): 1200 mg via ORAL
  Filled 2022-01-16 (×9): qty 2

## 2022-01-16 MED ORDER — BENZONATATE 100 MG PO CAPS
100.0000 mg | ORAL_CAPSULE | Freq: Three times a day (TID) | ORAL | Status: DC
  Administered 2022-01-16 – 2022-01-20 (×13): 100 mg via ORAL
  Filled 2022-01-16 (×13): qty 1

## 2022-01-16 NOTE — Progress Notes (Addendum)
[facility] Progress Note Patient: Caleb [name] WIO:[id number] DOB: [birth year] DOA: 01/12/2022  DOS: the patient was seen and examined on 01/16/2022  Brief hospital course: 72 y.o. male with medical history significant for hypertension, CAD status post CABG, PAD status post right femoral to popliteal bypass and bilateral iliac stenting, chronic HFmrEF, SIADH, COPD, cervical spinal stenosis with chronic pain, and left hip ulcer with trochanteric osteomyelitis who presents to the emergency department with generalized weakness and falls. Patient typically ambulates with a walker but has had increasing difficulty with this due to generalized weakness over the past few days.  In the ED CT head did not show any acute findings, chest x-ray was unremarkable.  Started on IV ceftriaxone for concern for UTI.  Urine culture grew E. coli.  Antibiotics discontinued after 2 days as there were no symptoms. Started on IV vancomycin due to positive blood culture on admission for coagulase-negative staph.  Repeat blood cultures performed before initiation of vancomycin, so far negative after 48 hours.  Based on prior discussion with the ID vancomycin is now stopped. Awaiting discussion with the patient's primary wound care provider Dr. Lovett Calender with regards to disposition.  Currently monitor off of antibiotics. 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 Patient follows up [facility]. Patient is scheduled for surgical debridement and a bone biopsy on 01/18/2022. Patient was [facility] in December for osteomyelitis.  They are holding antibiotics until the bone biopsy is performed to increase the yield. Patient missed his preop appointment which was scheduled on 1/2. Initially EDP discussed with Dr. Quentin Cornwall, plastic surgery at [facility], who indicated that the patient does not need admission to [facility].  Currently need  discussion with regards to timing of the surgery on 1/4 in view of patient's hospitalization.  Per my discussion with [facility], Dr. Lovett Calender will be available 1/3 only to discuss. If the surgery is still a 'go' patient should be discharged home to follow-up with [facility] on 1/4. If the surgery is now canceled or postponed, ID should be consulted for formal antibiotics therapy for his osteomyelitis.  Antibiotic therapy Patient presented with a fall here and was given 2 days of IV ceftriaxone for concern for UTI causing the fall.  Antibiotics were discontinued as the patient does not have any urinary symptoms. Patient's blood culture came back positive for coagulase-negative staph 1 out of 2 bottles.  Given his untreated osteomyelitis this was treated with IV vancomycin.  Prior to initiation of vancomycin blood cultures were performed.  Those blood cultures on 12/31 are actually negative thus makes the case for the blood cultures performed on 12/29 a contamination.  Patient will still need antibiotic for 6 weeks for his osteomyelitis pending plan for surgery as above.  E. coli asymptomatic bacteriuria. Continue antibiotics.  Upper respiratory infection. There is a concern that the patient has a viral respiratory infection. Respiratory virus pathogen panel as well as COVID and influenza is negative. Was briefly on steroids which is currently discontinued due to presence of osteomyelitis. Currently improving with symptomatic therapy only. Monitor.  Generalized weakness and frequent falls. Multiple etiologies for the weakness in the fall. Patient had history of falls as well. PT OT initially recommended SNF although daughter currently agrees with the plan to go home as long as surgery is scheduled on 1/4.  History of cervical stenosis and cervical myelopathy. Chronic pain syndrome. Known history.  Not  a candidate for surgical intervention.  Continue conservative measures.   Likely responsible for his fall and generalized weakness.  Chronic anticoagulation. Patient is chronically on Xarelto.  Appears to be a DVT prophylaxis dose In order to facilitate the surgery Xarelto is currently discontinued.  Last dose 12/31.   Chronic SIADH. Sodium level stable. Monitor  HTN. Patient is on amlodipine 5 mg, carvedilol 25 mg twice daily continue.  BPH. Patient is on Flomax. Continue.  Chronic neuropathy. Chronic pain syndrome. Patient is on Lyrica 75 mg 3 times daily. Will continue the same for now.  Elevated BNP. I do not feel the patient actually is volume overloaded for now.  Chest x-ray also negative for any congestion.  Continue blood pressure control.   Subjective: Continues to have some cough and shortness of breath which is chronic.  Generalized weakness still present.  No nausea or vomiting.  Oral intake adequate.  Physical Exam: General: in Mild distress, No Rash Cardiovascular: S1 and S2 Present, No Murmur Respiratory: Good respiratory effort, Bilateral Air entry present. No Crackles, Occasional  wheezes Abdomen: Bowel Sound present, No tenderness Extremities: No edema Neuro: Alert and oriented x3, no new focal deficit  Data Reviewed: I have Reviewed nursing notes, Vitals, and Lab results. Since last encounter, pertinent lab results CBC and BMP   . I have ordered test including CBC and BMP  .  I have called the physician access line at [place], but no physician was available to answer.  Disposition: Status is: Inpatient Remains inpatient appropriate because: Need plan with regards to antibiotics and surgery  enoxaparin (LOVENOX) injection 40 mg Start: 01/15/22 2200   Family Communication: Discussed with daughter in detail.  She is currently in agreement with plan. Level of care: Med-Surg  Vitals:   01/15/22 1428 01/15/22 2134 01/16/22 0551 01/16/22 1247  BP: (!) 146/55 (!) 151/57 (!) 171/62 (!) 151/59  Pulse:  (!) 57 (!) 54 (!) 59 (!) 55  Resp: '17  18 18  '$ Temp: 98.6 F (37 C) 98.5 F (36.9 C) 98.7 F (37.1 C) 98.3 F (36.8 C)  TempSrc:      SpO2: 90% 93% 97% 100%  Weight:      Height:         Author: Berle Mull, MD 01/16/2022 6:21 PM  Please look on [email or web address] to find out who is on call.

## 2022-01-16 NOTE — Plan of Care (Signed)
Plan of care reviewed and discussed.  Changes to wound care discussed with the patient, the patient does not want the dressing to be changed again today.

## 2022-01-16 NOTE — Progress Notes (Signed)
Physical Therapy Treatment Patient Details Name: Caleb [name] MRN: [id number] DOB: 1950-04-04 Today's Date: 01/16/2022   History of Present Illness Pt is a 72yo male presenting to [facility] ED on 12/29 reporting generalized weakness including difficulty standing, reporting falls. Imaging with no acute findings. PMH: HTN, CAD s/p CABG, PAD, CHF, SIADH, COPD, chronic pain with cervical spinal stenosis, L hip trochanteric osteomyelitis, R hallux amputation.    PT Comments    Pt is progressing toward goals, continues to be a fall risk, is limited by pain with WBing L hip. Will benefit from SNF for  continued rehabd  Recommendations for follow up therapy are one component of a multi-disciplinary discharge planning process, led by the attending physician.  Recommendations may be updated based on patient status, additional functional criteria and insurance authorization.  Follow Up Recommendations  Skilled nursing-short term rehab (<3 hours/day) Can patient physically be transported by private vehicle: No   Assistance Recommended at Discharge Frequent or constant Supervision/Assistance  Patient can return home with the following Assist for transportation;Help with stairs or ramp for entrance;Assistance with cooking/housework;A lot of help with bathing/dressing/bathroom;A lot of help with walking and/or transfers   Equipment Recommendations  None recommended by PT    Recommendations for Other Services       Precautions / Restrictions Precautions Precautions: Fall Precaution Comments: hx of falling, left hip wound/drainage Restrictions Weight Bearing Restrictions: No Other Position/Activity Restrictions: wbat     Mobility  Bed Mobility Overal bed mobility: Needs Assistance Bed Mobility: Supine to Sit     Supine to sit: Supervision, HOB elevated     General bed mobility comments: cues to self assist, incr time    Transfers Overall transfer level: Needs assistance Equipment used: Rolling  walker (2 wheels) Transfers: Sit to/from Stand, Bed to chair/wheelchair/BSC Sit to Stand: Min assist   Step pivot transfers: Min assist       General transfer comment: assist to balance and maneuver RW, unable to WB LLE d/t pain in hip    Ambulation/Gait                   Stairs             Wheelchair Mobility    Modified Rankin (Stroke Patients Only)       Balance   Sitting-balance support: No upper extremity supported, Feet supported Sitting balance-Leahy Scale: Good     Standing balance support: During functional activity, Reliant on assistive device for balance, Bilateral upper extremity supported Standing balance-Leahy Scale: Poor                              Cognition Arousal/Alertness: Awake/alert Behavior During Therapy: WFL for tasks assessed/performed Overall Cognitive Status: Within Functional Limits for tasks assessed                                          Exercises General Exercises - Lower Extremity Ankle Circles/Pumps: AROM, 10 reps, Both Long Arc Quad: AROM, Both, 10 reps, Seated    General Comments        Pertinent Vitals/Pain Pain Assessment Pain Assessment: Faces Faces Pain Scale: Hurts even more Pain Location: L hip Pain Descriptors / Indicators: Grimacing, Guarding Pain Intervention(s): Limited activity within patient's tolerance, Monitored during session, Premedicated before session, Repositioned    Home Living  Prior Function            PT Goals (current goals can now be found in the care plan section) Acute Rehab PT Goals Patient Stated Goal: to go home PT Goal Formulation: With patient Time For Goal Achievement: 01/27/22 Potential to Achieve Goals: Fair Progress towards PT goals: Progressing toward goals    Frequency    Min 2X/week      PT Plan Current plan remains appropriate    Co-evaluation              AM-PAC PT "6  Clicks" Mobility   Outcome Measure  Help needed turning from your back to your side while in a flat bed without using bedrails?: A Little Help needed moving from lying on your back to sitting on the side of a flat bed without using bedrails?: A Little Help needed moving to and from a bed to a chair (including a wheelchair)?: A Little Help needed standing up from a chair using your arms (e.g., wheelchair or bedside chair)?: A Little Help needed to walk in hospital room?: A Lot Help needed climbing 3-5 steps with a railing? : Total 6 Click Score: 15    End of Session Equipment Utilized During Treatment: Gait belt Activity Tolerance: Patient tolerated treatment well;Patient limited by pain Patient left: in chair;with call bell/phone within reach;with chair alarm set Nurse Communication: Mobility status PT Visit Diagnosis: Pain;Difficulty in walking, not elsewhere classified (R26.2);History of falling (Z91.81) Pain - Right/Left: Left Pain - part of body: Hip     Time: 9147-8295 PT Time Calculation (min) (ACUTE ONLY): 15 min  Charges:  $Therapeutic Activity: 8-22 mins                     Baxter Flattery, PT  Acute Rehab Dept [facility]) [phone]  [facility] Weekend Pager [facility] only)  [phone]  01/16/2022    [facility] 01/16/2022, 11:45 AM

## 2022-01-17 DIAGNOSIS — R531 Weakness: Secondary | ICD-10-CM | POA: Diagnosis not present

## 2022-01-17 LAB — BASIC METABOLIC PANEL
Anion gap: 8 (ref 5–15)
BUN: 9 mg/dL (ref 8–23)
CO2: 23 mmol/L (ref 22–32)
Calcium: 8 mg/dL — ABNORMAL LOW (ref 8.9–10.3)
Chloride: 99 mmol/L (ref 98–111)
Creatinine, Ser: 0.71 mg/dL (ref 0.61–1.24)
GFR, Estimated: >60 mL/min (ref >60)
Glucose, Bld: 99 mg/dL (ref 70–99)
Potassium: 3.5 mmol/L (ref 3.5–5.1)
Sodium: 130 mmol/L — ABNORMAL LOW (ref 135–145)

## 2022-01-17 LAB — CBC
HCT: 30.3 % — ABNORMAL LOW (ref 39.0–52.0)
Hemoglobin: 10 g/dL — ABNORMAL LOW (ref 13.0–17.0)
MCH: 33 pg (ref 26.0–34.0)
MCHC: 33 g/dL (ref 30.0–36.0)
MCV: 100 fL (ref 80.0–100.0)
Platelets: 216 10*3/uL (ref 150–400)
RBC: 3.03 MIL/uL — ABNORMAL LOW (ref 4.22–5.81)
RDW: 13.1 % (ref 11.5–15.5)
WBC: 8.1 10*3/uL (ref 4.0–10.5)
nRBC: 0 % (ref 0.0–0.2)

## 2022-01-17 LAB — MAGNESIUM: Magnesium: 1.6 mg/dL — ABNORMAL LOW (ref 1.7–2.4)

## 2022-01-17 LAB — PROCALCITONIN: Procalcitonin: 0.35 ng/mL

## 2022-01-17 MED ORDER — MAGNESIUM SULFATE 2 GM/50ML IV SOLN
2.0000 g | Freq: Once | INTRAVENOUS | Status: AC
  Administered 2022-01-17: 2 g via INTRAVENOUS
  Filled 2022-01-17: qty 50

## 2022-01-17 MED ORDER — RIVAROXABAN 2.5 MG PO TABS
2.5000 mg | ORAL_TABLET | Freq: Two times a day (BID) | ORAL | Status: DC
  Administered 2022-01-17 – 2022-01-20 (×6): 2.5 mg via ORAL
  Filled 2022-01-17 (×7): qty 1

## 2022-01-17 NOTE — Progress Notes (Signed)
[facility] Progress Note   Patient: Caleb [name] DOB: 1950/05/04 DOA: 01/12/2022  DOS: the patient was seen and examined on 01/17/2022  Brief hospital course: 72 y.o. male with medical history significant for HTN, CAD status post CABG, PAD status post right femoral to popliteal bypass and bilateral iliac stenting, chronic HFmrEF, SIADH, COPD, cervical spinal stenosis with chronic pain, and left hip ulcer with trochanteric osteomyelitis who presents to the ED with generalized weakness and falls. Patient typically ambulates with a walker but has had increasing difficulty with this due to generalized weakness over the past few days.  In the ED CT head did not show any acute findings, chest x-ray was unremarkable. Started on IV ceftriaxone for concern for UTI.  Urine culture grew E. coli.  Antibiotics discontinued after 2 days as there were no symptoms. Started on IV vancomycin due to positive blood culture on admission for coagulase-negative staph. Repeat blood cultures performed before initiation of vancomycin, so far negative after 48 hours. Based on prior discussion with the ID vancomycin is now stopped.   Discussed with Dr Lerry Paterson on 01/17/22, a resident with Dr Lovett Calender, who is patient's primary wound care provider with [facility] recommends to follow up as an outpatient for rescheduling of procedure as soon as possible. Currently monitor off of antibiotics.    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 Patient follows up with [facility] Patient was scheduled for surgical debridement and a bone biopsy on 01/18/2022, now needs to be rescheduled as mentioned above Patient was [facility] in December for osteomyelitis.  They are holding antibiotics until the bone biopsy is performed to increase the yield Initially EDP discussed with Dr. Quentin Cornwall at [facility],  who indicated that the patient does not need admission to [facility] Discussed extensively with patient's daughter, who will be calling the wound care outpatient center on 01/18/2018 4 in the morning to reschedule his procedure as an outpatient ASAP  E. coli asymptomatic bacteriuria Received 2 days of antibiotics, and was discontinued due to no symptoms  Generalized weakness and frequent falls. Multiple etiologies for the weakness in the fall. Patient had history of falls as well. PT OT initially recommended SNF  Hypomagnesemia Replace as needed  History of cervical stenosis and cervical myelopathy. Chronic pain syndrome Known history.  Not a candidate for surgical intervention Continue conservative measures Possibly responsible for his fall and generalized weakness  Chronic anticoagulation Patient is chronically on Xarelto Appears to be a DVT prophylaxis dose Restart Xarelto since surgery is postponed   Chronic SIADH Sodium level stable. Monitor  HTN Patient is on amlodipine 5 mg, carvedilol 25 mg twice daily continue.  BPH Patient is on Flomax  Chronic neuropathy Chronic pain syndrome. Patient is on Lyrica 75 mg 3 times daily    Subjective:  Patient denies any new complaints.  Eager to have his procedure   Physical Exam: General: NAD  Cardiovascular: S1, S2 present Respiratory: CTAB Abdomen: Soft, nontender, nondistended, bowel sounds present Musculoskeletal: No bilateral pedal edema noted Skin: Normal Psychiatry: Normal mood     Disposition: Status is: Inpatient Remains inpatient appropriate because: Need plan with regards to antibiotics and surgery   rivaroxaban (XARELTO) tablet 2.5 mg      Family Communication:  Discussed with daughter in details, further discharge plans pending on outpatient appointment   Level of care: Med-Surg  Vitals:  01/16/22 1247 01/16/22 2103 01/17/22 0520 01/17/22 1346  BP: (!) 151/59 (!) 150/51 (!) 162/54 (!) 141/51   Pulse: (!) 55 63 66 (!) 57  Resp: '18 17 17 17  '$ Temp: 98.3 F (36.8 C) 99 F (37.2 C) 99.2 F (37.3 C) 99 F (37.2 C)  TempSrc:  Oral Oral   SpO2: 100% 97% 98% 98%  Weight:      Height:        Author: Alma Friendly, MD 01/17/2022 4:45 PM  Please look on [email or web address] to find out who is on call.

## 2022-01-17 NOTE — Care Management Important Message (Signed)
Important Message  Patient Details IM Letter given. Name: Caleb [name] MRN: [id number] Date of Birth: Jul 16, 1950   Medicare Important Message Given:  Yes     Kerin Salen 01/17/2022, 9:23 AM

## 2022-01-18 DIAGNOSIS — M8660 Other chronic osteomyelitis, unspecified site: Secondary | ICD-10-CM | POA: Diagnosis not present

## 2022-01-18 DIAGNOSIS — R531 Weakness: Secondary | ICD-10-CM | POA: Diagnosis not present

## 2022-01-18 DIAGNOSIS — M4802 Spinal stenosis, cervical region: Secondary | ICD-10-CM | POA: Diagnosis not present

## 2022-01-18 DIAGNOSIS — L89229 Pressure ulcer of left hip, unspecified stage: Secondary | ICD-10-CM | POA: Diagnosis not present

## 2022-01-18 LAB — BASIC METABOLIC PANEL
Anion gap: 6 (ref 5–15)
BUN: 11 mg/dL (ref 8–23)
CO2: 24 mmol/L (ref 22–32)
Calcium: 8.2 mg/dL — ABNORMAL LOW (ref 8.9–10.3)
Chloride: 101 mmol/L (ref 98–111)
Creatinine, Ser: 0.78 mg/dL (ref 0.61–1.24)
GFR, Estimated: >60 mL/min (ref >60)
Glucose, Bld: 105 mg/dL — ABNORMAL HIGH (ref 70–99)
Potassium: 4.1 mmol/L (ref 3.5–5.1)
Sodium: 131 mmol/L — ABNORMAL LOW (ref 135–145)

## 2022-01-18 LAB — CBC
HCT: 28.9 % — ABNORMAL LOW (ref 39.0–52.0)
Hemoglobin: 9.5 g/dL — ABNORMAL LOW (ref 13.0–17.0)
MCH: 32.8 pg (ref 26.0–34.0)
MCHC: 32.9 g/dL (ref 30.0–36.0)
MCV: 99.7 fL (ref 80.0–100.0)
Platelets: 210 10*3/uL (ref 150–400)
RBC: 2.9 MIL/uL — ABNORMAL LOW (ref 4.22–5.81)
RDW: 13.2 % (ref 11.5–15.5)
WBC: 7.6 10*3/uL (ref 4.0–10.5)
nRBC: 0 % (ref 0.0–0.2)

## 2022-01-18 LAB — MAGNESIUM: Magnesium: 2.3 mg/dL (ref 1.7–2.4)

## 2022-01-18 NOTE — Progress Notes (Signed)
Physical Therapy Treatment Patient Details Name: Caleb [name] MRN: [id number] DOB: 1950-10-06 Today's Date: 01/18/2022   History of Present Illness Pt is a 72yo male presenting to [facility] ED on 12/29 reporting generalized weakness including difficulty standing, reporting falls. Imaging with no acute findings. PMH: HTN, CAD s/p CABG, PAD, CHF, SIADH, COPD, chronic pain with cervical spinal stenosis, L hip trochanteric osteomyelitis, R hallux amputation.    PT Comments    Pt agreeable to attempt ambulating and able to ambulate short distance within room.  Pt denies pain today however endorses weakness and fatigue.  Pt assisted back to bed.  Pt with with bleeding from left hip wound upon getting OOB earlier however outer dressing clean and intact and no drainage observed pre and post mobility.   Pt would benefit from post acute rehab upon d/c.   Recommendations for follow up therapy are one component of a multi-disciplinary discharge planning process, led by the attending physician.  Recommendations may be updated based on patient status, additional functional criteria and insurance authorization.  Follow Up Recommendations  Skilled nursing-short term rehab (<3 hours/day) Can patient physically be transported by private vehicle: No   Assistance Recommended at Discharge Frequent or constant Supervision/Assistance  Patient can return home with the following Assist for transportation;Help with stairs or ramp for entrance;Assistance with cooking/housework;A little help with walking and/or transfers;A little help with bathing/dressing/bathroom   Equipment Recommendations  None recommended by PT    Recommendations for Other Services       Precautions / Restrictions Precautions Precautions: Fall Precaution Comments: hx of falling, left hip wound/drainage Restrictions Weight Bearing Restrictions: No Other Position/Activity Restrictions: WBAT     Mobility  Bed Mobility Overal bed mobility: Needs  Assistance Bed Mobility: Sit to Supine       Sit to supine: Min guard, HOB elevated   General bed mobility comments: increased time    Transfers Overall transfer level: Needs assistance Equipment used: Rolling walker (2 wheels) Transfers: Sit to/from Stand Sit to Stand: Min assist           General transfer comment: verbal cues for hand placement, assist to rise and control descent    Ambulation/Gait Ambulation/Gait assistance: Min assist Gait Distance (Feet): 10 Feet Assistive device: Rolling walker (2 wheels) Gait Pattern/deviations: Step-to pattern, Decreased stance time - left, Narrow base of support Gait velocity: decreased     General Gait Details: pt able to take small steps today with cues for increasing weight bearing on UEs through RW, pt denies L leg pain however distance limited by fatigue and weakness   Stairs             Wheelchair Mobility    Modified Rankin (Stroke Patients Only)       Balance Overall balance assessment: Needs assistance         Standing balance support: During functional activity, Reliant on assistive device for balance, Bilateral upper extremity supported Standing balance-Leahy Scale: Poor                              Cognition Arousal/Alertness: Awake/alert Behavior During Therapy: WFL for tasks assessed/performed, Flat affect Overall Cognitive Status: Within Functional Limits for tasks assessed                                          Exercises  General Comments        Pertinent Vitals/Pain Pain Assessment Pain Assessment: No/denies pain Pain Intervention(s): Repositioned, Monitored during session    Home Living                          Prior Function            PT Goals (current goals can now be found in the care plan section) Progress towards PT goals: Progressing toward goals    Frequency    Min 2X/week      PT Plan Current plan remains  appropriate    Co-evaluation              AM-PAC PT "6 Clicks" Mobility   Outcome Measure  Help needed turning from your back to your side while in a flat bed without using bedrails?: A Little Help needed moving from lying on your back to sitting on the side of a flat bed without using bedrails?: A Little Help needed moving to and from a bed to a chair (including a wheelchair)?: A Little Help needed standing up from a chair using your arms (e.g., wheelchair or bedside chair)?: A Lot Help needed to walk in hospital room?: A Lot Help needed climbing 3-5 steps with a railing? : Total 6 Click Score: 14    End of Session Equipment Utilized During Treatment: Gait belt Activity Tolerance: Patient tolerated treatment well Patient left: in bed;with call bell/phone within reach;with bed alarm set   PT Visit Diagnosis: Difficulty in walking, not elsewhere classified (R26.2);History of falling (Z91.81)     Time: 9628-3662 PT Time Calculation (min) (ACUTE ONLY): 18 min  Charges:  $Gait Training: 8-22 mins                    Jannette Spanner PT, DPT Physical Therapist Acute Rehabilitation Services Preferred contact method: Secure Chat Weekend Pager Only: [phone] Office: [place] 01/18/2022, 3:15 PM

## 2022-01-18 NOTE — Progress Notes (Signed)
Occupational Therapy Treatment Patient Details Name: Caleb [name] MRN: [id number] DOB: [birth year] Today's Date: 01/18/2022   History of present illness Pt is a 72yo male presenting to [facility] ED on 12/29 reporting generalized weakness including difficulty standing, reporting falls. Imaging with no acute findings. PMH: HTN, CAD s/p CABG, PAD, CHF, SIADH, COPD, chronic pain with cervical spinal stenosis, L hip trochanteric osteomyelitis, R hallux amputation.   OT comments  The session emphasized standing tolerance, out of bed activity, and functional transfer training needed to facilitate progressive ADL performance. The pt performed 3 sit to stand transfers from the EOB, using a RW for support. He required a seated rest break between stands, with his overall standing tolerance decreased. He was unable to lift his LLE off the floor in order to take steps, therefore subsequently required assistance and cues to stand-pivot to the bedside chair. He reported having minimal L hip pain with activity. He presented with good effort and participation. Continue OT plan of care.    Recommendations for follow up therapy are one component of a multi-disciplinary discharge planning process, led by the attending physician.  Recommendations may be updated based on patient status, additional functional criteria and insurance authorization.    Follow Up Recommendations  Skilled nursing-short term rehab (<3 hours/day)     Assistance Recommended at Discharge Frequent or constant Supervision/Assistance  Patient can return home with the following  A lot of help with bathing/dressing/bathroom;A lot of help with walking and/or transfers;Assistance with cooking/housework;Help with stairs or ramp for entrance   Equipment Recommendations  None recommended by OT    Recommendations for Other Services      Precautions / Restrictions Precautions Precautions: Fall Precaution Comments: hx of falling, left hip  wound/drainage Restrictions Weight Bearing Restrictions: No Other Position/Activity Restrictions: wbat       Mobility Bed Mobility Overal bed mobility: Needs Assistance Bed Mobility: Supine to Sit     Supine to sit: Supervision, HOB elevated Sit to supine: Min assist, HOB elevated        Transfers Overall transfer level: Needs assistance Equipment used: Rolling walker (2 wheels) Transfers: Sit to/from Stand, Bed to chair/wheelchair/BSC Sit to Stand: Min assist, From elevated surface Stand pivot transfers: Mod assist         General transfer comment: Pt performed 3 stands using a RW. He required cues to push up from transfer surface and to push through BUE on walker to offload LLE as needed. Once in standing, he required mod assist with increased cues for RW placement & advancement, pivot technique, and reaching back prior to sitting, in order to stand-pivot to the bedside chair         ADL either performed or assessed with clinical judgement   ADL Overall ADL's : Needs assistance/impaired Eating/Feeding: Independent;Sitting   Grooming: Set up;Sitting           Upper Body Dressing : Set up;Sitting   Lower Body Dressing: Moderate assistance;Sit to/from stand                        Cognition Arousal/Alertness: Awake/alert Behavior During Therapy: WFL for tasks assessed/performed Overall Cognitive Status: Within Functional Limits for tasks assessed                       General Comments     Pertinent Vitals/ Pain       Pain Assessment Pain Assessment: Faces Pain Score: 3  Pain Location: L hip  Pain Intervention(s): Limited activity within patient's tolerance, Repositioned         Frequency  Min 2X/week        Progress Toward Goals  OT Goals(current goals can now be found in the care plan section)  Progress towards OT goals: Progressing toward goals  Acute Rehab OT Goals OT Goal Formulation: With patient Time For Goal  Achievement: 01/29/22 Potential to Achieve Goals: Good  Plan Discharge plan remains appropriate       AM-PAC OT "6 Clicks" Daily Activity     Outcome Measure   Help from another person eating meals?: None Help from another person taking care of personal grooming?: A Little Help from another person toileting, which includes using toliet, bedpan, or urinal?: A Lot Help from another person bathing (including washing, rinsing, drying)?: A Lot Help from another person to put on and taking off regular upper body clothing?: None Help from another person to put on and taking off regular lower body clothing?: A Lot 6 Click Score: 17    End of Session Equipment Utilized During Treatment: Rolling walker (2 wheels)  OT Visit Diagnosis: Muscle weakness (generalized) (M62.81)   Activity Tolerance Patient tolerated treatment well   Patient Left in chair;with call bell/phone within reach;with chair alarm set   Nurse Communication Mobility status;Other (comment) (Nurse also informed of pt's bleeding L hip wound)        Time: 6979-4801 OT Time Calculation (min): 29 min  Charges: OT General Charges $OT Visit: 1 Visit OT Treatments $Therapeutic Activity: 23-37 mins    Leota Sauers, OTR/L 01/18/2022, 1:46 PM

## 2022-01-18 NOTE — Progress Notes (Signed)
[facility] Progress Note   Patient: Caleb [name] GNF:[id number] DOB: 09-15-1950 DOA: 01/12/2022  DOS: the patient was seen and examined on 01/18/2022  Brief hospital course: 72 y.o. male with medical history significant for HTN, CAD status post CABG, PAD status post right femoral to popliteal bypass and bilateral iliac stenting, chronic HFmrEF, SIADH, COPD, cervical spinal stenosis with chronic pain, and left hip ulcer with trochanteric osteomyelitis who presents to the ED with generalized weakness and falls. Patient typically ambulates with a walker but has had increasing difficulty with this due to generalized weakness over the past few days.  In the ED CT head did not show any acute findings, chest x-ray was unremarkable. Started on IV ceftriaxone for concern for UTI.  Urine culture grew E. coli.  Antibiotics discontinued after 2 days as there were no symptoms. Started on IV vancomycin due to positive blood culture on admission for coagulase-negative staph. Repeat blood cultures performed before initiation of vancomycin, so far negative after 48 hours. Based on prior discussion with the ID vancomycin is now stopped.   Discussed with Dr Lerry Paterson on 01/17/22, a resident with Dr Lovett Calender, who is patient's primary wound care provider with [facility] recommends to follow up as an outpatient for rescheduling of procedure as soon as possible. Currently monitor off of antibiotics.    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 Patient follows up [facility] Patient was scheduled for surgical debridement and a bone biopsy on 01/18/2022, now needs to be rescheduled as mentioned above Patient was [facility] for [facility]omyelitis.  They are holding antibiotics until the bone biopsy is performed to increase the yield Initially EDP discussed with Dr. Quentin Cornwall at [facility],  who indicated that the patient does not need admission to [facility] Discussed extensively with patient's daughter, who will be calling the wound care outpatient center to reschedule his procedure as an outpatient ASAP  E. coli asymptomatic bacteriuria Received 2 days of antibiotics, and was discontinued due to no symptoms  Generalized weakness and frequent falls. Multiple etiologies for the weakness in the fall. Patient had history of falls as well. PT OT recommended SNF  Hypomagnesemia Replace as needed  History of cervical stenosis and cervical myelopathy. Chronic pain syndrome Known history.  Not a candidate for surgical intervention Continue conservative measures Possibly responsible for his fall and generalized weakness  Chronic anticoagulation Patient is chronically on Xarelto Appears to be a DVT prophylaxis dose Restart Xarelto since surgery is postponed   Chronic SIADH Sodium level stable. Monitor  HTN Patient is on amlodipine 5 mg, carvedilol 25 mg twice daily continue.  BPH Patient is on Flomax  Chronic neuropathy Chronic pain syndrome. Patient is on Lyrica 75 mg 3 times daily    Subjective:  Patient denies any new complaints.   Physical Exam: General: NAD  Cardiovascular: S1, S2 present Respiratory: CTAB Abdomen: Soft, nontender, nondistended, bowel sounds present Musculoskeletal: No bilateral pedal edema noted Skin: Normal Psychiatry: Normal mood     Disposition: Status is: Inpatient Remains inpatient appropriate because: Level of care   rivaroxaban (XARELTO) tablet 2.5 mg      Family Communication:  Discussed with daughter in details, further discharge plans pending on outpatient appointment   Level of care: Med-Surg  Vitals:   01/17/22 1346 01/17/22 2110 01/18/22 0641 01/18/22 1354  BP: (!) 141/51 (!) 142/51 (!) 157/63 [name])  120/54  Pulse: (!) 57 (!) 55 60 (!) 56  Resp: '17 17 17 17  '$ Temp: 99 F (37.2 C) 98.4 F (36.9 C) 98.7 F (37.1  C) 99.1 F (37.3 C)  TempSrc:  Oral Oral   SpO2: 98% 98% 98% 99%  Weight:      Height:        Author: Alma Friendly, MD 01/18/2022 5:45 PM  Please look on [email or web address] to find out who is on call.

## 2022-01-18 NOTE — TOC Progression Note (Signed)
Transition of Care [facility]) - Progression Note   Patient Details  Name: Caleb [name] MRN: [id number] Date of Birth: [birth year]  Transition of Care [facility]) CM/SW [place], LCSW Phone Number: 01/18/2022, 11:30 AM  Clinical Narrative: CSW spoke with patient and daughter regarding SNF versus discharging home and then getting the surgery that was originally planned for today. Per daughter, she has been unable to reschedule the surgery as the surgeon is in the OR today. Daughter to follow-up with rescheduling the surgery. TOC to follow in the event patient will need SNF prior to surgery.    Expected Discharge Plan: [place] Barriers to Discharge: Continued Medical Work up  Expected Discharge Plan and Services In-house Referral: Clinical Social Work Post Acute Care Choice: [place] Living arrangements for the past 2 months: Mobile Home             DME Arranged: N/A DME Agency: NA  Social Determinants of Health (SDOH) Interventions [place]: Low Risk  (01/13/2022)  Tobacco Use: High Risk (01/12/2022)   Readmission Risk Interventions     No data to display

## 2022-01-19 DIAGNOSIS — M8660 Other chronic osteomyelitis, unspecified site: Secondary | ICD-10-CM | POA: Diagnosis not present

## 2022-01-19 DIAGNOSIS — R531 Weakness: Secondary | ICD-10-CM | POA: Diagnosis not present

## 2022-01-19 DIAGNOSIS — M4802 Spinal stenosis, cervical region: Secondary | ICD-10-CM | POA: Diagnosis not present

## 2022-01-19 DIAGNOSIS — L89229 Pressure ulcer of left hip, unspecified stage: Secondary | ICD-10-CM | POA: Diagnosis not present

## 2022-01-19 LAB — BASIC METABOLIC PANEL
Anion gap: 5 (ref 5–15)
BUN: 13 mg/dL (ref 8–23)
CO2: 24 mmol/L (ref 22–32)
Calcium: 8 mg/dL — ABNORMAL LOW (ref 8.9–10.3)
Chloride: 102 mmol/L (ref 98–111)
Creatinine, Ser: 0.84 mg/dL (ref 0.61–1.24)
GFR, Estimated: >60 mL/min (ref >60)
Glucose, Bld: 106 mg/dL — ABNORMAL HIGH (ref 70–99)
Potassium: 4 mmol/L (ref 3.5–5.1)
Sodium: 131 mmol/L — ABNORMAL LOW (ref 135–145)

## 2022-01-19 LAB — CULTURE, BLOOD (ROUTINE X 2)
Culture: NO GROWTH
Culture: NO GROWTH
Special Requests: ADEQUATE
Special Requests: ADEQUATE

## 2022-01-19 LAB — CBC WITH DIFFERENTIAL/PLATELET
Abs Immature Granulocytes: 0.03 10*3/uL (ref 0.00–0.07)
Basophils Absolute: 0.1 10*3/uL (ref 0.0–0.1)
Basophils Relative: 1 %
Eosinophils Absolute: 0.2 10*3/uL (ref 0.0–0.5)
Eosinophils Relative: 3 %
HCT: 29.5 % — ABNORMAL LOW (ref 39.0–52.0)
Hemoglobin: 9.6 g/dL — ABNORMAL LOW (ref 13.0–17.0)
Immature Granulocytes: 0 %
Lymphocytes Relative: 17 %
Lymphs Abs: 1.2 10*3/uL (ref 0.7–4.0)
MCH: 32.5 pg (ref 26.0–34.0)
MCHC: 32.5 g/dL (ref 30.0–36.0)
MCV: 100 fL (ref 80.0–100.0)
Monocytes Absolute: 0.7 10*3/uL (ref 0.1–1.0)
Monocytes Relative: 10 %
Neutro Abs: 4.8 10*3/uL (ref 1.7–7.7)
Neutrophils Relative %: 69 %
Platelets: 207 10*3/uL (ref 150–400)
RBC: 2.95 MIL/uL — ABNORMAL LOW (ref 4.22–5.81)
RDW: 13.1 % (ref 11.5–15.5)
WBC: 7 10*3/uL (ref 4.0–10.5)
nRBC: 0 % (ref 0.0–0.2)

## 2022-01-19 NOTE — TOC Progression Note (Signed)
Transition of Care [facility]) - Progression Note   Patient Details  Name: Caleb [name] MRN: [id number] Date of Birth: [birth year]  Transition of Care [facility]) CM/SW [place], LCSW Phone Number: 01/19/2022, 3:50 PM  Clinical Narrative: CSW spoke with patient's daughter and the patient's surgeon wants to see the patient in office on 01/24/22 before his surgery is rescheduled. CSW updated hospitalist.  Expected Discharge Plan: Skilled Nursing Facility Barriers to Discharge: Continued Medical Work up  Expected Discharge Plan and Services In-house Referral: Clinical Social Work Post Acute Care Choice: [place] Living arrangements for the past 2 months: Mobile Home             DME Arranged: N/A DME Agency: NA  Social Determinants of Health (SDOH) Interventions [place]: Low Risk  (01/13/2022)  Tobacco Use: High Risk (01/12/2022)   Readmission Risk Interventions     No data to display

## 2022-01-19 NOTE — Care Management CC44 (Signed)
Condition Code 44 Documentation Completed  Patient Details  Name: ODYSSEUS CADA MRN: [id number] Date of Birth: 1950/11/30   Condition Code 44 given:  Yes Patient signature on Condition Code 44 notice:  Yes Documentation of 2 MD's agreement:  Yes Code 44 added to claim:  Yes    Rodney Booze, LCSW 01/19/2022, 4:52 PM

## 2022-01-19 NOTE — Progress Notes (Addendum)
[facility] Progress Note   Patient: Caleb [name] GXQ:[id number] DOB: 1950/08/15 DOA: 01/12/2022  DOS: the patient was seen and examined on 01/19/2022  Brief hospital course: 72 y.o. male with medical history significant for HTN, CAD status post CABG, PAD status post right femoral to popliteal bypass and bilateral iliac stenting, chronic HFmrEF, SIADH, COPD, cervical spinal stenosis with chronic pain, and left hip ulcer with trochanteric osteomyelitis who presents to the ED with generalized weakness and falls. Patient typically ambulates with a walker but has had increasing difficulty with this due to generalized weakness over the past few days.  In the ED CT head did not show any acute findings, chest x-ray was unremarkable. Started on IV ceftriaxone for concern for UTI.  Urine culture grew E. coli.  Antibiotics discontinued after 2 days as there were no symptoms. Started on IV vancomycin due to positive blood culture on admission for coagulase-negative staph. Repeat blood cultures performed before initiation of vancomycin, so far negative after 48 hours. Based on prior discussion with the ID vancomycin is now stopped.     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 Patient [facility] up [facility] Patient was scheduled for surgical debridement and a bone biopsy on 01/18/2022, now rescheduled for an outpatient appointment on 01/24/2022 to discuss plans for surgery [facility]el[facility].  They are holding antibiotics until the bone biopsy is performed to increase the yield Initially EDP discussed with Dr. Quentin Cornwall at [facility], who indicated that the patient does not need admission to [facility] Discussed extensively with ID, Dr. Gale Journey on 01/19/2022 who recommends since it is chronic osteomyelitis with chronic left hip ulcer, may benefit more from previous  recommendation for debridement at [place]  E. coli asymptomatic bacteriuria Received 2 days of antibiotics, and was discontinued due to no symptoms  Generalized weakness and frequent falls Multiple etiologies for the weakness in the fall. Patient had history of falls as well. PT OT recommended SNF  Hypomagnesemia Replace as needed  History of cervical stenosis and cervical myelopathy. Chronic pain syndrome Known history.  Not a candidate for surgical intervention Continue conservative measures Possibly responsible for his fall and generalized weakness  Chronic anticoagulation Patient is chronically on Xarelto Appears to be a DVT prophylaxis dose Restart Xarelto since surgery is postponed   Chronic SIADH Sodium level stable. Monitor  HTN Patient is on amlodipine 5 mg, carvedilol 25 mg twice daily continue.  BPH Patient is on Flomax  Chronic neuropathy Chronic pain syndrome. Patient is on Lyrica 75 mg 3 times daily    Subjective:  Patient denies any new complaints.  Is able to ambulate with a walker around his room   Physical Exam: General: NAD  Cardiovascular: S1, S2 present Respiratory: CTAB Abdomen: Soft, nontender, nondistended, bowel sounds present Musculoskeletal: No bilateral pedal edema noted, left hip wound dressing noted Skin: As mentioned above Psychiatry: Normal mood     Disposition: Status is: Inpatient Remains inpatient appropriate because: Level of care   rivaroxaban (XARELTO) tablet 2.5 mg      Family Communication:  Discussed with daughter in details, further discharge plans pending on outpatient appointment   Level of care: Med-Surg  Vitals:   01/18/22 1354 01/18/22 2135 01/19/22 0527 01/19/22 1447  BP: (!) 120/54 (!) 142/53 (!) 160/56 (!) 118/51  Pulse: (!) 56 (!) 57 (!) 57 [name])  51  Resp: '17 18 18 17  '$ Temp: 99.1 F (37.3 C) 99.4 F (37.4 C) (!) 97.4 F (36.3 C) 98.7 F (37.1 C)  TempSrc:    Oral  SpO2: 99% 99%  100% 100%  Weight:      Height:        Author: Alma Friendly, MD 01/19/2022 6:24 PM  Please look on [email or web address] to find out who is on call.

## 2022-01-19 NOTE — Care Management Obs Status (Signed)
[place] NOTIFICATION   Patient Details  Name: Caleb [name] MRN: [id number] Date of Birth: 1950/12/11   Medicare Observation Status Notification Given:  Yes    Rodney Booze, LCSW 01/19/2022, 4:52 PM

## 2022-01-19 NOTE — Progress Notes (Signed)
CSW delivered Code 44 to the patient.

## 2022-01-19 NOTE — Care Management CC44 (Signed)
Condition Code 44 Documentation Completed  Patient Details  Name: GARNELL PHENIX MRN: [id number] Date of Birth: 09-Nov-1950   Condition Code 44 given:  Yes Patient signature on Condition Code 44 notice:  Yes Documentation of 2 MD's agreement:  Yes Code 44 added to claim:  Yes    Rodney Booze, LCSW 01/19/2022, 4:52 PM

## 2022-01-19 NOTE — Care Management Important Message (Signed)
Important Message  Patient Details  Name: LEDARRIUS BEAUCHAINE MRN: [id number] Date of Birth: [birth year]   Medicare Important Message Given:  Yes     Rodney Booze, LCSW 01/19/2022, 4:52 PM

## 2022-01-20 DIAGNOSIS — R531 Weakness: Secondary | ICD-10-CM | POA: Diagnosis not present

## 2022-01-20 DIAGNOSIS — M8660 Other chronic osteomyelitis, unspecified site: Secondary | ICD-10-CM | POA: Diagnosis not present

## 2022-01-20 DIAGNOSIS — I10 Essential (primary) hypertension: Secondary | ICD-10-CM | POA: Diagnosis not present

## 2022-01-20 DIAGNOSIS — L89229 Pressure ulcer of left hip, unspecified stage: Secondary | ICD-10-CM | POA: Diagnosis not present

## 2022-01-20 MED ORDER — BENZONATATE 100 MG PO CAPS: 100.0000 mg | ORAL_CAPSULE | Freq: Two times a day (BID) | ORAL | 0 refills | Status: DC | PRN

## 2022-01-20 NOTE — Discharge Summary (Signed)
Physician Discharge Summary   Patient: Caleb [name] MRN: [id number] DOB: 1950-06-15  Admit date:     01/12/2022  Discharge date: 01/20/22  Discharge Physician: Alma Friendly   PCP: Percell Belt, DO   Recommendations at discharge:   Follow-up with PCP in 1 week Follow-up with plastic surgeon at wound center in [place] as scheduled  Discharge Diagnoses: Principal Problem:   General weakness Active Problems:   Essential hypertension   COPD (chronic obstructive pulmonary disease) (HCC)   Heart failure with mildly reduced ejection fraction (HFmrEF) (HCC)   SIADH (syndrome of inappropriate ADH production) (HCC)   Cervical spinal stenosis   CAD (coronary artery disease)   Chronic osteomyelitis (HCC)   Decubitus ulcer of left hip   Bacteremia    Hospital Course: 72 y.o. male with medical history significant for HTN, CAD status post CABG, PAD status post right femoral to popliteal bypass and bilateral iliac stenting, chronic HFmrEF, SIADH, COPD, cervical spinal stenosis with chronic pain, and left hip ulcer with trochanteric osteomyelitis who presents to the ED with generalized weakness and falls. Patient typically ambulates with a walker but has had increasing difficulty with this due to generalized weakness over the past few days.  In the ED CT head did not show any acute findings, chest x-ray was unremarkable. Started on IV ceftriaxone for concern for UTI.  Urine culture grew E. coli.  Antibiotics discontinued after 2 days as there were no symptoms. Started on IV vancomycin due to positive blood culture on admission for coagulase-negative staph. Repeat blood cultures performed before initiation of vancomycin, so far negative after 48 hours. Based on prior discussion with the ID vancomycin is now stopped.   Today, patient denies any new complaints.  Eager to be discharged.  Patient is able to ambulate to the bathroom and back with his walker.  Discussed extensively with  daughter, plans to follow-up with Dr. 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 [facility] Patient was scheduled for surgical debridement and a bone biopsy on 01/18/2022, now rescheduled for an outpatient appointment on 01/24/2022 to discuss plans for surgery Patient was [facility] for osteomyelitis.  They are holding antibiotics until the bone biopsy is performed to increase the yield Discussed extensively with ID, Dr. Gale Journey on 01/19/2022 who recommends since it is chronic osteomyelitis with chronic left hip ulcer, may benefit more from previous recommendation for debridement at [facility] where he follows at the wound care center   E. coli asymptomatic bacteriuria Received 2 days of antibiotics, and was discontinued due to no symptoms   Generalized weakness and frequent falls Multiple etiologies for the weakness in the fall. Patient had history of falls as well. PT OT recommended SNF   Hypomagnesemia Replace as needed   History of cervical stenosis and cervical myelopathy. Chronic pain syndrome Known history.  Not a candidate for surgical intervention Continue conservative measures Possibly responsible for his fall and generalized weakness   Chronic anticoagulation Patient is chronically on Xarelto Appears to be a DVT prophylaxis dose Restart Xarelto since surgery is postponed   Chronic SIADH Sodium level stable. Monitor   HTN Patient is on amlodipine 5 mg, carvedilol 25 mg twice daily continue.   BPH Patient is on Flomax   Chronic neuropathy Chronic pain  syndrome. Patient is on Lyrica 75 mg 3 times daily         Consultants: None Procedures performed: None Disposition: Home Diet recommendation:  Cardiac diet   DISCHARGE  MEDICATION: Allergies as of 01/20/2022       Reactions   Bactrim [sulfamethoxazole-trimethoprim] Other (See Comments)   "Made potassium go high and sodium go low"   Lisinopril Cough, Other (See Comments)   Losartan Swelling   Pt's daughter stated it made pt's blood pressure and sodium too low, but pt has been tolerating irbesartan.   Pletal [cilostazol] Swelling, Other (See Comments)   Site of swelling not recalled by the patient        Medication List     TAKE these medications    acetaminophen 500 MG tablet Commonly known as: TYLENOL Take 1,000 mg by mouth 2 (two) times daily as needed for mild pain or moderate pain.   albuterol 108 (90 Base) MCG/ACT inhaler Commonly known as: VENTOLIN HFA Inhale 2 puffs into the lungs every 6 (six) hours as needed for wheezing or shortness of breath.   alendronate 70 MG tablet Commonly known as: FOSAMAX Take 70 mg by mouth every Saturday.   amLODipine 5 MG tablet Commonly known as: NORVASC Take 5 mg by mouth daily.   aspirin EC 81 MG tablet Take 81 mg by mouth daily. Swallow whole.   atorvastatin 80 MG tablet Commonly known as: LIPITOR Take 1 tablet (80 mg total) by mouth daily.   benzonatate 100 MG capsule Commonly known as: TESSALON Take 1 capsule (100 mg total) by mouth 2 (two) times daily as needed for cough.   carvedilol 25 MG tablet Commonly known as: COREG Take 25 mg by mouth 2 (two) times daily with a meal.   cyanocobalamin 1000 MCG tablet Commonly known as: VITAMIN B12 Take 1,000 mcg by mouth daily with breakfast.   ferrous sulfate 325 (65 FE) MG tablet Take 325 mg by mouth daily with breakfast.   fexofenadine 180 MG tablet Commonly known as: ALLEGRA Take 180 mg by mouth daily.   folic acid 1 MG tablet Commonly known as: FOLVITE Take 1 mg by mouth daily.   irbesartan 300 MG tablet Commonly known as: AVAPRO Take 300 mg by mouth daily.   nitroGLYCERIN 0.4 MG SL tablet Commonly known as: NITROSTAT Place  0.4 mg under the tongue every 5 (five) minutes as needed for chest pain.   Olopatadine HCl 0.2 % Soln Place 1 drop into both eyes daily as needed (allergies).   pantoprazole 40 MG tablet Commonly known as: PROTONIX Take 40 mg by mouth daily.   pregabalin 75 MG capsule Commonly known as: LYRICA Take 75 mg by mouth 3 (three) times daily.   sodium chloride 1 g tablet Take 1 g by mouth 2 (two) times daily with a meal.   tamsulosin 0.4 MG Caps capsule Commonly known as: FLOMAX Take 0.4 mg by mouth every evening.   thiamine 100 MG tablet Commonly known as: VITAMIN B1 Take 100 mg by mouth daily.   Vitamin D-3 25 MCG (1000 UT) Caps Take 1,000 Units by mouth daily with breakfast.   Xarelto 2.5 MG Tabs tablet Generic drug: rivaroxaban Take 2.5 mg by mouth 2 (two) times daily.        Follow-up Information     Lazoff, Shawn P, DO. Schedule an appointment as soon as possible for a visit in 1 week(s).   Specialty: Family Medicine Contact information: [address] [place]  [place] [phone]                Discharge Exam: Danley Danker Weights   01/13/22 1352  Weight: 65.9 kg   General: NAD  Cardiovascular: S1, S2 present Respiratory: CTAB Abdomen: Soft, nontender, nondistended, bowel sounds present Musculoskeletal: No bilateral pedal edema noted Skin: Normal Psychiatry: Normal mood   Condition at discharge: good  The results of significant diagnostics from this hospitalization (including imaging, microbiology, ancillary and laboratory) are listed below for reference.   Imaging Studies: CT Head Wo Contrast  Result Date: 01/12/2022 CLINICAL DATA:  Head trauma, minor (Age >= 65y); Neck trauma (Age >= 65y) EXAM: CT HEAD WITHOUT CONTRAST CT CERVICAL SPINE WITHOUT CONTRAST TECHNIQUE: Multidetector CT imaging of the head and cervical spine was performed following the standard protocol without intravenous contrast. Multiplanar CT image reconstructions of the  cervical spine were also generated. RADIATION DOSE REDUCTION: This exam was performed according to the departmental dose-optimization program which includes automated exposure control, adjustment of the mA and/or kV according to patient size and/or use of iterative reconstruction technique. COMPARISON:  MRI of the cervical spine March 13, 2020. FINDINGS: CT HEAD FINDINGS Brain: No evidence of acute infarction, hemorrhage, hydrocephalus, extra-axial collection or mass lesion/mass effect. Remote lacunar infarcts in the cerebellum, left thalamus and basal ganglia. Additional patchy white matter hypodensities, nonspecific but compatible with chronic microvascular ischemic disease. Vascular: Calcific atherosclerosis. Skull: No acute fracture. Sinuses/Orbits: Clear sinuses.  No acute orbital findings. Other: No mastoid effusions. CT CERVICAL SPINE FINDINGS Alignment: Degenerative anterolisthesis of C3 on C4, C4 on C5, similar to prior MRI. No new malalignment. Dextrocurvature. Skull base and vertebrae: No evidence of acute fracture. Soft tissues and spinal canal: Visualized lung apices are clear. Disc levels: Multilevel facet uncovertebral hypertrophy which contributes (along with malalignment) to probably severe foraminal stenosis at multiple levels, including C3-C4 and C4-C5. Upper chest: Visualized lung apices are clear. IMPRESSION: CT head: 1. No evidence of acute intracranial abnormality. 2. Chronic microvascular ischemic disease. CT cervical spine: 1. No evidence of acute fracture or traumatic malalignment. 2. Probably severe foraminal stenosis at C3-C4 and C4-C5. An MRI could further characterize if clinically warranted. Electronically Signed   By: Margaretha Sheffield M.D.   On: 01/12/2022 16:17   CT Cervical Spine Wo Contrast  Result Date: 01/12/2022 CLINICAL DATA:  Head trauma, minor (Age >= 65y); Neck trauma (Age >= 65y) EXAM: CT HEAD WITHOUT CONTRAST CT CERVICAL SPINE WITHOUT CONTRAST TECHNIQUE:  Multidetector CT imaging of the head and cervical spine was performed following the standard protocol without intravenous contrast. Multiplanar CT image reconstructions of the cervical spine were also generated. RADIATION DOSE REDUCTION: This exam was performed according to the departmental dose-optimization program which includes automated exposure control, adjustment of the mA and/or kV according to patient size and/or use of iterative reconstruction technique. COMPARISON:  MRI of the cervical spine March 13, 2020. FINDINGS: CT HEAD FINDINGS Brain: No evidence of acute infarction, hemorrhage, hydrocephalus, extra-axial collection or mass lesion/mass effect. Remote lacunar infarcts in the cerebellum, left thalamus and basal ganglia. Additional patchy white matter hypodensities, nonspecific but compatible with chronic microvascular ischemic disease. Vascular: Calcific atherosclerosis. Skull: No acute fracture. Sinuses/Orbits: Clear sinuses.  No acute orbital findings. Other: No mastoid effusions. CT CERVICAL SPINE FINDINGS Alignment: Degenerative anterolisthesis of C3 on C4, C4 on C5, similar to prior MRI. No new malalignment. Dextrocurvature. Skull base and vertebrae: No evidence of acute fracture. Soft tissues and spinal canal: Visualized lung apices are clear. Disc levels: Multilevel  facet uncovertebral hypertrophy which contributes (along with malalignment) to probably severe foraminal stenosis at multiple levels, including C3-C4 and C4-C5. Upper chest: Visualized lung apices are clear. IMPRESSION: CT head: 1. No evidence of acute intracranial abnormality. 2. Chronic microvascular ischemic disease. CT cervical spine: 1. No evidence of acute fracture or traumatic malalignment. 2. Probably severe foraminal stenosis at C3-C4 and C4-C5. An MRI could further characterize if clinically warranted. Electronically Signed   By: Margaretha Sheffield M.D.   On: 01/12/2022 16:17   DG Hip Unilat W or Wo Pelvis 2-3 Views  Left  Result Date: 01/12/2022 CLINICAL DATA:  Chronic wound. Pressure ulcer at the posterolateral left hip abutting the left femoral greater trochanter. Scheduled surgery 6 days from now. EXAM: DG HIP (WITH OR WITHOUT PELVIS) 2-3V LEFT COMPARISON:  Pelvis and left hip radiographs 12/11/2021; MRI pelvis without and with contrast 12/25/2021 FINDINGS: Mild to moderate bilateral superomedial femoroacetabular joint space narrowing. The bilateral sacroiliac and pubic symphysis joint spaces are maintained. Surgical clips overlie the right groin. Left common and external iliac arterial stent grafts. There are scattered air lucencies within the soft tissues just lateral to the left greater trochanter. Minimal lateral left greater trochanter cortical erosion as better seen on 12/25/2021 MRI. No acute fracture or dislocation. Moderate to severe left L4-5 disc space narrowing with degenerative vacuum disc. IMPRESSION: 1. Scattered air lucencies within the soft tissues just lateral to the left greater trochanter consistent with the wound sinus tract seen on 12/25/2021 MRI. 2. Minimal lateral left greater trochanter cortical erosion as better seen on 12/25/2021 MRI where findings were concerning for osteomyelitis. Electronically Signed   By: Yvonne Kendall M.D.   On: 01/12/2022 15:52   DG Chest Portable 1 View  Result Date: 01/12/2022 CLINICAL DATA:  Chronic wound. Pressure ulcer of left hip/buttocks. Scheduled for surgery in 6 days. EXAM: PORTABLE CHEST 1 VIEW COMPARISON:  AP chest 09/05/2020 FINDINGS: Status post median sternotomy and CABG. Cardiac silhouette is at the upper limits of normal size. Mild-to-moderate calcification within the aortic arch. The lungs are clear. No pleural effusion or pneumothorax. No acute skeletal abnormality. IMPRESSION: No acute cardiopulmonary disease. Electronically Signed   By: Yvonne Kendall M.D.   On: 01/12/2022 15:39    Microbiology: Results for orders placed or performed during the  hospital encounter of 01/12/22  Resp panel by RT-PCR (RSV, Flu A&B, Covid) Anterior Nasal Swab     Status: None   Collection Time: 01/12/22  3:20 PM   Specimen: Anterior Nasal Swab  Result Value Ref Range Status   SARS Coronavirus 2 by RT PCR NEGATIVE NEGATIVE Final    Comment: (NOTE) SARS-CoV-2 target nucleic acids are NOT DETECTED.  The SARS-CoV-2 RNA is generally detectable in upper respiratory specimens during the acute phase of infection. The lowest concentration of SARS-CoV-2 viral copies this assay can detect is 138 copies/mL. A negative result does not preclude SARS-Cov-2 infection and should not be used as the sole basis for treatment or other patient management decisions. A negative result may occur with  improper specimen collection/handling, submission of specimen other than nasopharyngeal swab, presence of viral mutation(s) within the areas targeted by this assay, and inadequate number of viral copies(<138 copies/mL). A negative result must be combined with clinical observations, patient history, and epidemiological information. The expected result is Negative.  Fact Sheet for Patients:  EntrepreneurPulse.com.au  Fact Sheet for Healthcare Providers:  IncredibleEmployment.be  This test is no t yet approved or cleared by the [place] and  has been authorized for detection and/or diagnosis of SARS-CoV-2 by FDA under an Emergency Use Authorization (EUA). This EUA will remain  in effect (meaning this test can be used) for the duration of the COVID-19 declaration under Section 564(b)(1) of the Act, 21 U.S.C.section 360bbb-3(b)(1), unless the authorization is terminated  or revoked sooner.       Influenza A by PCR NEGATIVE NEGATIVE Final   Influenza B by PCR NEGATIVE NEGATIVE Final    Comment: (NOTE) The Xpert Xpress SARS-CoV-2/FLU/RSV plus assay is intended as an aid in the diagnosis of influenza from Nasopharyngeal swab  specimens and should not be used as a sole basis for treatment. Nasal washings and aspirates are unacceptable for Xpert Xpress SARS-CoV-2/FLU/RSV testing.  Fact Sheet for Patients: EntrepreneurPulse.com.au  Fact Sheet for Healthcare Providers: IncredibleEmployment.be  This test is not yet approved or cleared by the [place] FDA and has been authorized for detection and/or diagnosis of SARS-CoV-2 by FDA under an Emergency Use Authorization (EUA). This EUA will remain in effect (meaning this test can be used) for the duration of the COVID-19 declaration under Section 564(b)(1) of the Act, 21 U.S.C. section 360bbb-3(b)(1), unless the authorization is terminated or revoked.     Resp Syncytial Virus by PCR NEGATIVE NEGATIVE Final    Comment: (NOTE) Fact Sheet for Patients: EntrepreneurPulse.com.au  Fact Sheet for Healthcare Providers: IncredibleEmployment.be  This test is not yet approved or cleared by the [place] FDA and has been authorized for detection and/or diagnosis of SARS-CoV-2 by FDA under an Emergency Use Authorization (EUA). This EUA will remain in effect (meaning this test can be used) for the duration of the COVID-19 declaration under Section 564(b)(1) of the Act, 21 U.S.C. section 360bbb-3(b)(1), unless the authorization is terminated or revoked.  Performed at [facility], [place] [address], [place], [place] [address]   Urine Culture     Status: Abnormal   Collection Time: 01/12/22  5:12 PM   Specimen: Urine, Clean Catch  Result Value Ref Range Status   Specimen Description   Final    URINE, CLEAN CATCH Performed at [facility], [place] [address], [place], [place] [address]    Special Requests   Final    NONE Performed at [facility], [place] [address] [place], [place] [address]    Culture >=100,000 COLONIES/mL ESCHERICHIA  COLI (A)  Final   Report Status 01/14/2022 FINAL  Final   Organism ID, Bacteria ESCHERICHIA COLI (A)  Final      Susceptibility   Escherichia coli - MIC*    AMPICILLIN <=2 SENSITIVE Sensitive     CEFAZOLIN <=4 SENSITIVE Sensitive     CEFEPIME <=0.12 SENSITIVE Sensitive     CEFTRIAXONE <=0.25 SENSITIVE Sensitive     CIPROFLOXACIN <=0.25 SENSITIVE Sensitive     GENTAMICIN <=1 SENSITIVE Sensitive     IMIPENEM <=0.25 SENSITIVE Sensitive     NITROFURANTOIN <=16 SENSITIVE Sensitive     TRIMETH/SULFA <=20 SENSITIVE Sensitive     AMPICILLIN/SULBACTAM <=2 SENSITIVE Sensitive     PIP/TAZO <=4 SENSITIVE Sensitive     * >=100,000 COLONIES/mL ESCHERICHIA COLI  Blood culture (routine x 2)     Status: Abnormal   Collection Time: 01/12/22  5:45 PM   Specimen: BLOOD  Result Value Ref Range Status   Specimen Description   Final    BLOOD SITE NOT SPECIFIED Performed at [place] [address] [place], [place] [address]    Special Requests  Final    BOTTLES DRAWN AEROBIC AND ANAEROBIC Blood Culture results may not be optimal due to an inadequate volume of blood received in culture bottles Performed at [facility], [place] [address], [place], [place] [address]    Culture  Setup Time   Final    AEROBIC BOTTLE ONLY GRAM POSITIVE COCCI IN CLUSTERS Organism ID to follow CRITICAL RESULT CALLED TO, READ BACK BY AND VERIFIED WITH:  C/ PHARMD J. GADHIA 01/13/22 2030 A. LAFRANCE    Culture (A)  Final    STAPHYLOCOCCUS VITULINUS THE SIGNIFICANCE OF ISOLATING THIS ORGANISM FROM A SINGLE SET OF BLOOD CULTURES WHEN MULTIPLE SETS ARE DRAWN IS UNCERTAIN. PLEASE NOTIFY THE MICROBIOLOGY DEPARTMENT WITHIN ONE WEEK IF SPECIATION AND SENSITIVITIES ARE REQUIRED. Performed at [facility], [place] [address], [place], [place] [address]    Report Status 01/15/2022 FINAL  Final  Blood Culture ID Panel (Reflexed)     Status: Abnormal   Collection Time: 01/12/22  5:45 PM   Result Value Ref Range Status   Enterococcus faecalis NOT DETECTED NOT DETECTED Final   Enterococcus Faecium NOT DETECTED NOT DETECTED Final   Listeria monocytogenes NOT DETECTED NOT DETECTED Final   Staphylococcus species DETECTED (A) NOT DETECTED Final    Comment: CRITICAL RESULT CALLED TO, READ BACK BY AND VERIFIED WITH:  C/ PHARMD J. GADHIA 01/13/22 2030 A. LAFRANCE    Staphylococcus aureus (BCID) NOT DETECTED NOT DETECTED Final   Staphylococcus epidermidis NOT DETECTED NOT DETECTED Final   Staphylococcus lugdunensis NOT DETECTED NOT DETECTED Final   Streptococcus species NOT DETECTED NOT DETECTED Final   Streptococcus agalactiae NOT DETECTED NOT DETECTED Final   Streptococcus pneumoniae NOT DETECTED NOT DETECTED Final   Streptococcus pyogenes NOT DETECTED NOT DETECTED Final   A.calcoaceticus-baumannii NOT DETECTED NOT DETECTED Final   Bacteroides fragilis NOT DETECTED NOT DETECTED Final   Enterobacterales NOT DETECTED NOT DETECTED Final   Enterobacter cloacae complex NOT DETECTED NOT DETECTED Final   Escherichia coli NOT DETECTED NOT DETECTED Final   Klebsiella aerogenes NOT DETECTED NOT DETECTED Final   Klebsiella oxytoca NOT DETECTED NOT DETECTED Final   Klebsiella pneumoniae NOT DETECTED NOT DETECTED Final   Proteus species NOT DETECTED NOT DETECTED Final   Salmonella species NOT DETECTED NOT DETECTED Final   Serratia marcescens NOT DETECTED NOT DETECTED Final   Haemophilus influenzae NOT DETECTED NOT DETECTED Final   Neisseria meningitidis NOT DETECTED NOT DETECTED Final   Pseudomonas aeruginosa NOT DETECTED NOT DETECTED Final   Stenotrophomonas maltophilia NOT DETECTED NOT DETECTED Final   Candida albicans NOT DETECTED NOT DETECTED Final   Candida auris NOT DETECTED NOT DETECTED Final   Candida glabrata NOT DETECTED NOT DETECTED Final   Candida krusei NOT DETECTED NOT DETECTED Final   Candida parapsilosis NOT DETECTED NOT DETECTED Final   Candida tropicalis NOT DETECTED  NOT DETECTED Final   Cryptococcus neoformans/gattii NOT DETECTED NOT DETECTED Final    Comment: Performed at [facility], [address], [place], [place] [address]  Respiratory (~20 pathogens) panel by PCR     Status: None   Collection Time: 01/13/22 10:40 AM   Specimen: Nasopharyngeal Swab; Respiratory  Result Value Ref Range Status   Adenovirus NOT DETECTED NOT DETECTED Final   Coronavirus 229E NOT DETECTED NOT DETECTED Final    Comment: (NOTE) The Coronavirus on the Respiratory Panel, DOES NOT test for the novel  Coronavirus (2019 nCoV)    Coronavirus HKU1 NOT DETECTED NOT DETECTED Final  Coronavirus NL63 NOT DETECTED NOT DETECTED Final   Coronavirus OC43 NOT DETECTED NOT DETECTED Final   Metapneumovirus NOT DETECTED NOT DETECTED Final   Rhinovirus / Enterovirus NOT DETECTED NOT DETECTED Final   Influenza A NOT DETECTED NOT DETECTED Final   Influenza B NOT DETECTED NOT DETECTED Final   Parainfluenza Virus 1 NOT DETECTED NOT DETECTED Final   Parainfluenza Virus 2 NOT DETECTED NOT DETECTED Final   Parainfluenza Virus 3 NOT DETECTED NOT DETECTED Final   Parainfluenza Virus 4 NOT DETECTED NOT DETECTED Final   Respiratory Syncytial Virus NOT DETECTED NOT DETECTED Final   Bordetella pertussis NOT DETECTED NOT DETECTED Final   Bordetella Parapertussis NOT DETECTED NOT DETECTED Final   Chlamydophila pneumoniae NOT DETECTED NOT DETECTED Final   Mycoplasma pneumoniae NOT DETECTED NOT DETECTED Final    Comment: Performed at [facility], [place] [address] [place], [place] [address]  Culture, blood (Routine X 2) w Reflex to ID Panel     Status: None   Collection Time: 01/14/22  8:47 AM   Specimen: BLOOD LEFT ARM  Result Value Ref Range Status   Specimen Description   Final    BLOOD LEFT ARM Performed at [facility], [place] [address], [place], [place] [address]    Special Requests   Final    BOTTLES DRAWN AEROBIC ONLY Blood Culture adequate volume Performed at [place] [address] [place], [place] [address]    Culture   Final    NO GROWTH 5 DAYS Performed at [facility], [place] [address] [place], [place] [address]    Report Status 01/19/2022 FINAL  Final  Culture, blood (Routine X 2) w Reflex to ID Panel     Status: None   Collection Time: 01/14/22  8:54 AM   Specimen: BLOOD LEFT HAND  Result Value Ref Range Status   Specimen Description   Final    BLOOD LEFT HAND Performed at [facility], [place] [address], [place], [place] [address]    Special Requests   Final    BOTTLES DRAWN AEROBIC ONLY Blood Culture adequate volume Performed at [place] [address], [place], [place] [address]    Culture   Final    NO GROWTH 5 DAYS Performed at [facility], [place] [address] [place],  [address]    Report Status 01/19/2022 FINAL  Final    Labs: CBC: Recent Labs  Lab 01/16/22 0340 01/17/22 0350 01/18/22 0329 01/19/22 0346  WBC 6.6 8.1 7.6 7.0  NEUTROABS  --   --   --  4.8  HGB 9.6* 10.0* 9.5* 9.6*  HCT 29.3* 30.3* 28.9* 29.5*  MCV 99.7 100.0 99.7 100.0  PLT 212 216 210 676   Basic Metabolic Panel: Recent Labs  Lab 01/14/22 0336 01/15/22 0348 01/16/22 0340 01/17/22 0350 01/18/22 0329 01/19/22 0346  NA 130* 129* 134* 130* 131* 131*  K 4.4 3.8 4.0 3.5 4.1 4.0  CL 99 100 104 99 101 102  CO2 '25 23 24 23 24 24  '$ GLUCOSE 119* 88 144* 99 105* 106*  BUN '13 15 14 9 11 13  '$ CREATININE 0.97 0.87 0.84 0.71 0.78 0.84  CALCIUM 8.4* 7.9* 8.2* 8.0* 8.2* 8.0*  MG 1.9 1.8 1.9 1.6* 2.3  --    Liver Function Tests: No results for input(s): "AST", "ALT", "ALKPHOS", "BILITOT", "PROT", "ALBUMIN" in the last 168 hours. CBG: No results for input(s): "GLUCAP" in the  last 168 hours.  Discharge time spent: less than 30 minutes.  Signed: Alma Friendly, MD [facility] 01/20/2022

## 2022-01-20 NOTE — TOC Transition Note (Signed)
Transition of Care [facility]) - CM/SW Discharge Note   Patient Details  Name: Caleb [name] MRN: [id number] Date of Birth: 19-Jul-1950  [facility] CM/SW Contact:  Henrietta Dine, RN Phone Number: 01/20/2022, 3:50 PM   Clinical Narrative:    Notified by Dr Horris Latino pt and dtr prefer that he d/c home instead of SNF; d/c orders received; spoke w/ pt in room and he confirms this plan; he says his dtr is on the way to pick him up; no TOC needs.   Final next level of care: Home/Self Care Barriers to Discharge: No Barriers Identified   Patient Goals and CMS Choice CMS Medicare.gov Compare Post Acute Care list provided to:: Patient Choice offered to / list presented to : Patient  Discharge Placement                         Discharge Plan and Services Additional resources added to the After Visit Summary for   In-house Referral: Clinical Social Work   Post Acute Care Choice: [place]          DME Arranged: N/A DME Agency: NA                  Social Determinants of Health ([place]) Interventions [place]: Low Risk  (01/13/2022)  Tobacco Use: High Risk (01/12/2022)     Readmission Risk Interventions     No data to display

## 2022-01-20 NOTE — Plan of Care (Signed)
  Problem: Activity: Goal: Risk for activity intolerance will decrease Outcome: Progressing   Problem: Pain Managment: Goal: General experience of comfort will improve Outcome: Progressing   Problem: Safety: Goal: Ability to remain free from injury will improve Outcome: Progressing   

## 2022-01-20 NOTE — Plan of Care (Signed)
  Problem: Clinical Measurements: Goal: Ability to maintain clinical measurements within normal limits will improve Outcome: Progressing Goal: Will remain free from infection Outcome: Progressing Goal: Diagnostic test results will improve Outcome: Progressing Goal: Respiratory complications will improve Outcome: Progressing   Problem: Activity: Goal: Risk for activity intolerance will decrease Outcome: Progressing   Problem: Elimination: Goal: Will not experience complications related to bowel motility Outcome: Progressing   Problem: Pain Managment: Goal: General experience of comfort will improve Outcome: Progressing   Problem: Safety: Goal: Ability to remain free from injury will improve Outcome: Progressing   Problem: Skin Integrity: Goal: Risk for impaired skin integrity will decrease Outcome: Progressing

## 2022-01-22 IMAGING — DX DG BONE SURVEY MET
10 series · 10 of 10 positions shown · non-contrast
Comparison: Chest CT 12/28/2019

CLINICAL DATA: m-protein spike. evaluate for lytic lesions

EXAM:
METASTATIC BONE SURVEY

[skull lat]
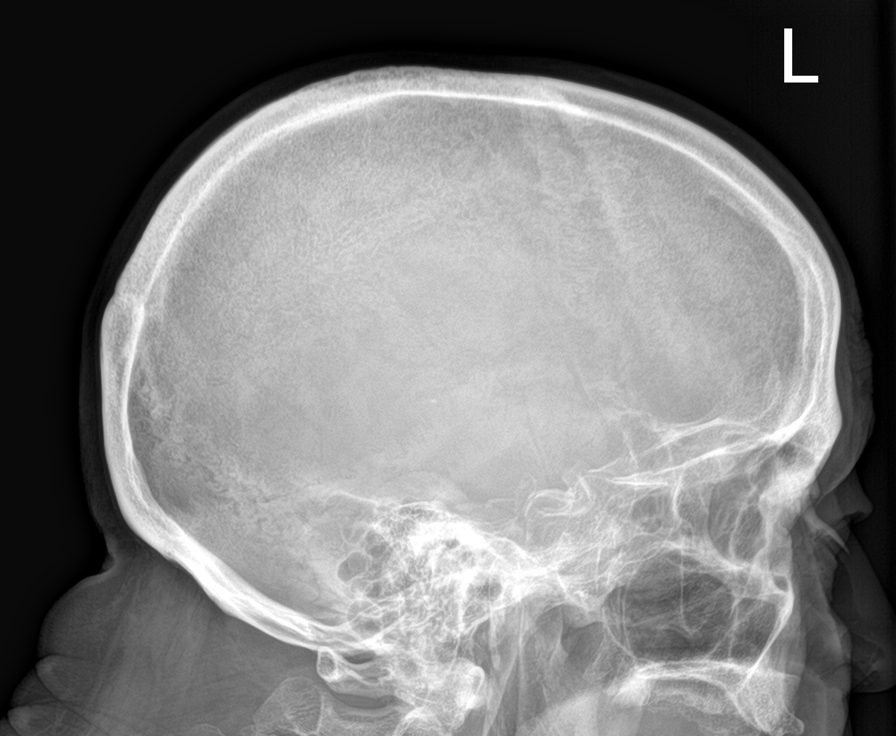

[shoulder ap (1 of 2)]
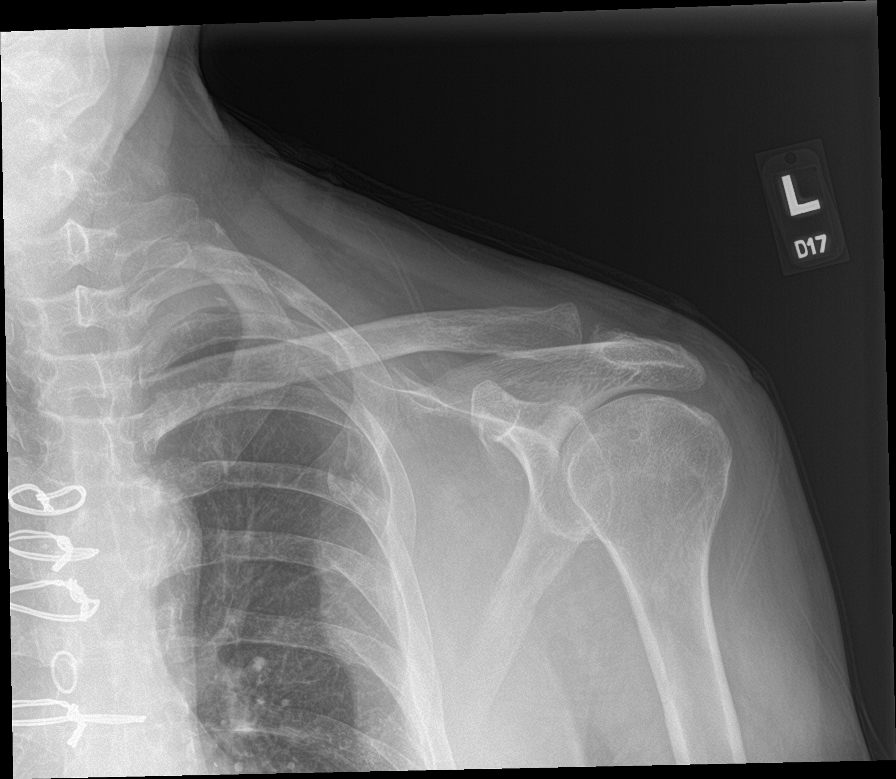

[shoulder ap (2 of 2)]
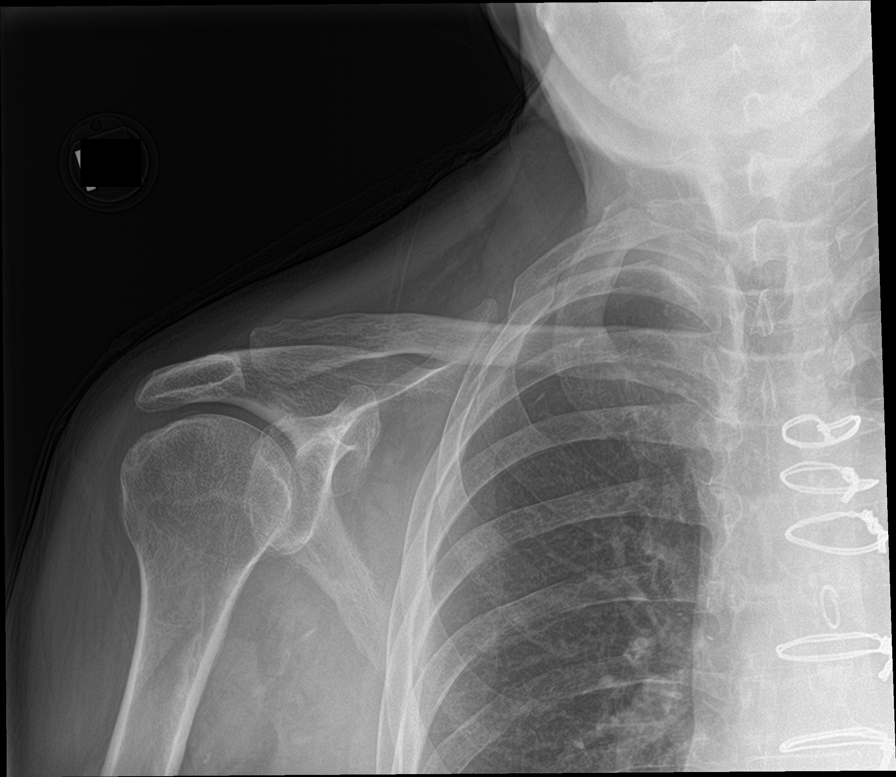

[humerus ap (1 of 2)]
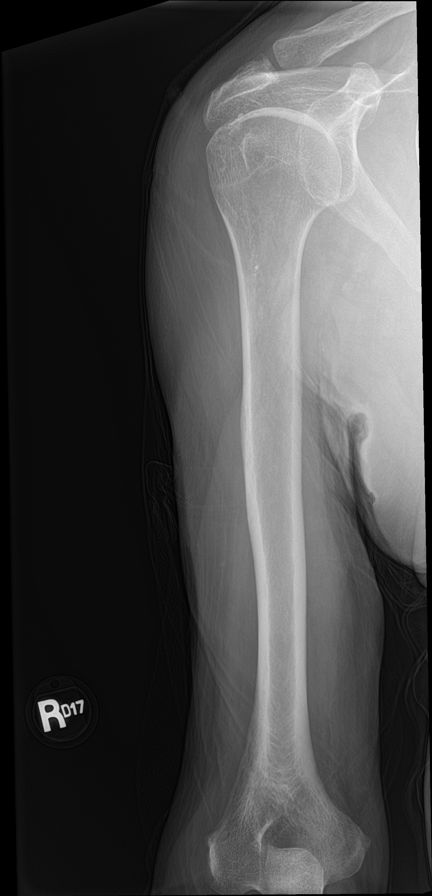

[humerus ap (2 of 2)]
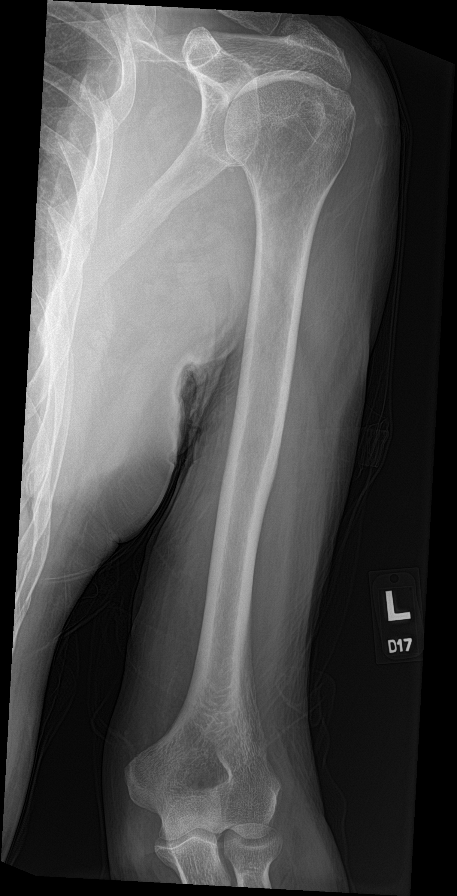

[forearm ap (1 of 2)]
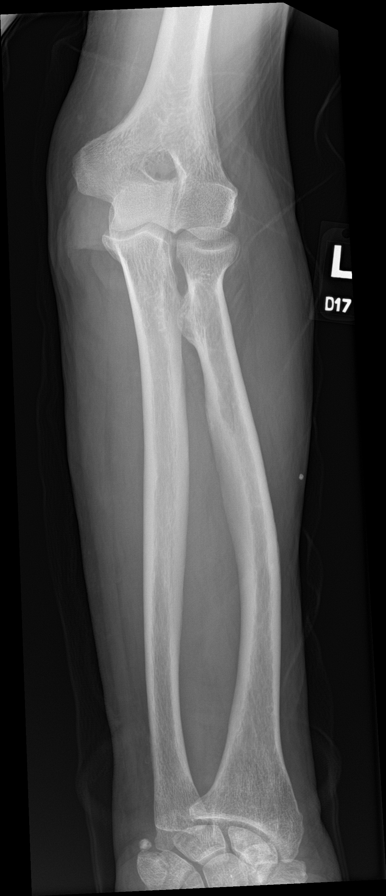

[forearm ap (2 of 2)]
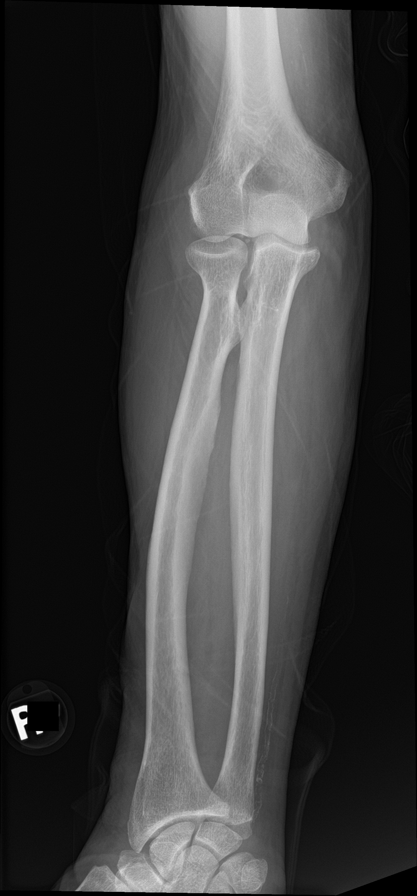

[c-spine ap]
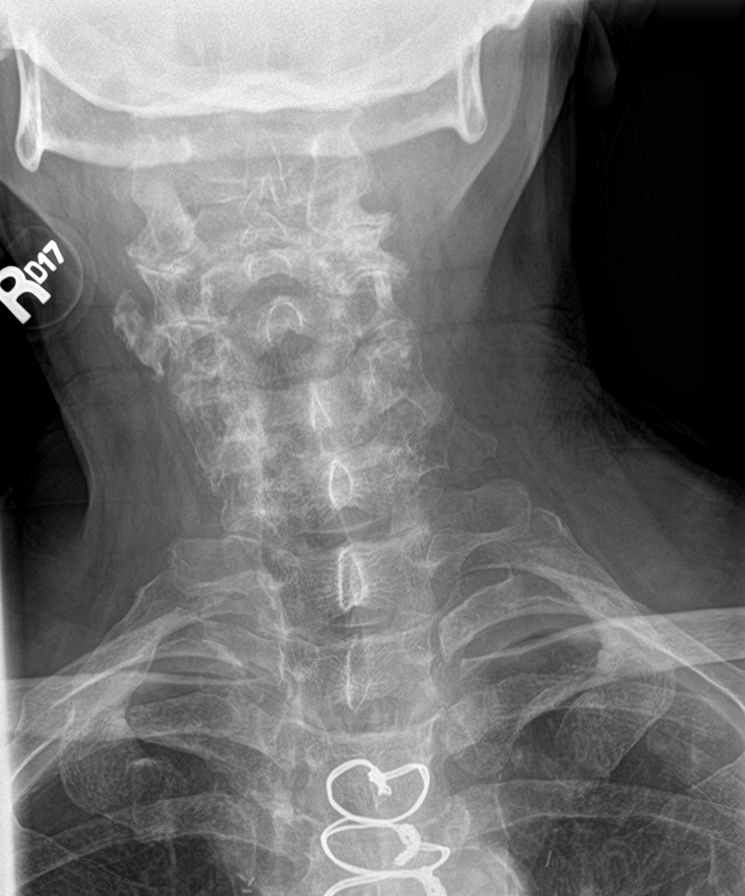

[c-spine lat]
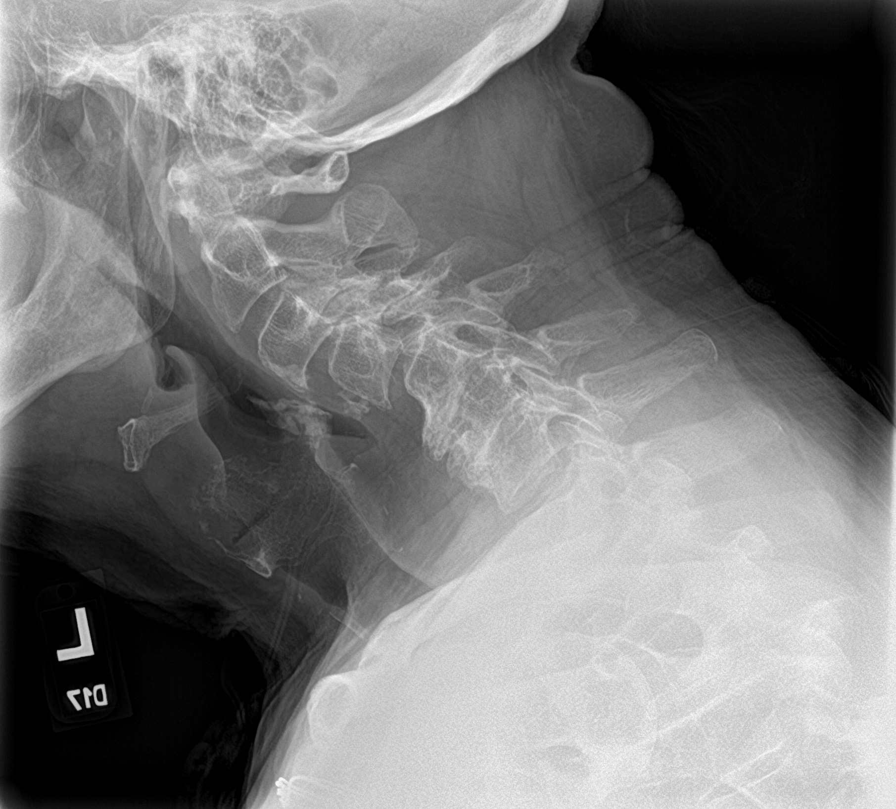

[t-spine ap]
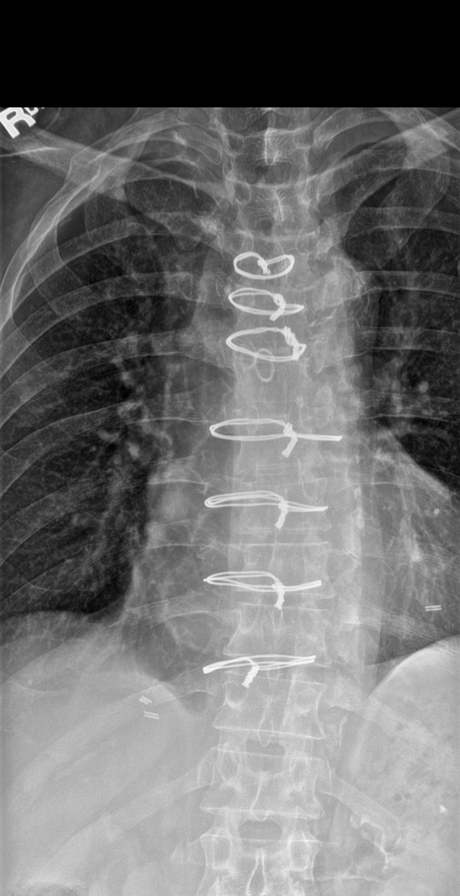

[10 of 10 positions shown; findings below may reference images not displayed]

FINDINGS: No evidence of calvarial lesion. Scoliosis and diffuse degenerative
change in the cervical spine. Anterolisthesis of C4 on C5 of 4 mm,
similar to prior 03/13/2020 cervical spine MRI. There is trace
retrolisthesis of C2 on C3. Advanced degenerative disc disease C5-C6
and C6-C7. No evidence of spinal compression deformity or focal
lesion. There is degenerative disc disease in the lumbar spine most
prominently affecting L4-L5. Multiple remote displaced left rib
fractures, nondisplaced right first rib fracture. No evidence of
underlying lesion. Median sternotomy. No evidence of lytic lesion
involving the extremities. Mild blastic osseous lesion. Advanced
vascular calcification with multiple vascular stents and probable
right femoral bypass graft. A splint overlies the left ankle.
IMPRESSION: 1. No radiographic evidence of lytic lesions in the axial or
appendicular skeleton.
2. Multiple remote displaced left rib fractures and nondisplaced
right first rib fracture. No evidence of underlying lesion.
3. Scoliosis and degenerative change in the cervical and lumbar
spine, assessed on prior MRI. Lumbar degenerative change.

## 2022-02-02 ENCOUNTER — Other Ambulatory Visit: Payer: Self-pay

## 2022-02-02 ENCOUNTER — Other Ambulatory Visit (HOSPITAL_COMMUNITY): Payer: Self-pay

## 2022-02-02 MED ORDER — ALENDRONATE SODIUM 70 MG PO TABS
70.0000 mg | ORAL_TABLET | ORAL | 3 refills | Status: DC
  Filled 2022-02-02: qty 12, 84d supply, fill #0

## 2022-02-02 MED ORDER — IRBESARTAN 300 MG PO TABS
300.0000 mg | ORAL_TABLET | Freq: Every day | ORAL | 4 refills | Status: DC
  Filled 2022-02-02: qty 90, 90d supply, fill #0
  Filled 2022-06-24: qty 90, 90d supply, fill #1

## 2022-02-02 MED ORDER — TAMSULOSIN HCL 0.4 MG PO CAPS
0.4000 mg | ORAL_CAPSULE | Freq: Every day | ORAL | 11 refills | Status: DC
  Filled 2022-02-02 – 2022-02-26 (×2): qty 30, 30d supply, fill #0
  Filled 2022-04-19: qty 30, 30d supply, fill #1
  Filled 2022-05-18: qty 30, 30d supply, fill #2
  Filled 2022-06-24: qty 30, 30d supply, fill #3

## 2022-02-02 MED ORDER — PANTOPRAZOLE SODIUM 40 MG PO TBEC
40.0000 mg | DELAYED_RELEASE_TABLET | Freq: Every day | ORAL | 4 refills | Status: DC
  Filled 2022-02-02: qty 90, 90d supply, fill #0
  Filled 2022-05-01: qty 90, 90d supply, fill #1

## 2022-02-03 ENCOUNTER — Other Ambulatory Visit (HOSPITAL_COMMUNITY): Payer: Self-pay

## 2022-02-05 ENCOUNTER — Other Ambulatory Visit (HOSPITAL_COMMUNITY): Payer: Self-pay

## 2022-02-05 ENCOUNTER — Other Ambulatory Visit: Payer: Self-pay

## 2022-02-05 MED ORDER — FERROUS SULFATE 325 (65 FE) MG PO TABS
325.0000 mg | ORAL_TABLET | Freq: Every day | ORAL | 4 refills | Status: AC
  Filled 2022-02-26 – 2022-03-02 (×2): qty 90, 90d supply, fill #0
  Filled 2022-04-19 – 2022-05-18 (×2): qty 90, 90d supply, fill #1
  Filled 2022-06-29: qty 90, 90d supply, fill #2

## 2022-02-05 MED ORDER — ATORVASTATIN CALCIUM 80 MG PO TABS
80.0000 mg | ORAL_TABLET | Freq: Every day | ORAL | 1 refills | Status: DC
  Filled 2022-02-05: qty 90, 90d supply, fill #0

## 2022-02-05 MED ORDER — FOLIC ACID 1 MG PO TABS
1.0000 mg | ORAL_TABLET | Freq: Every day | ORAL | 4 refills | Status: DC
  Filled 2022-02-26: qty 90, 90d supply, fill #0
  Filled 2022-04-19 – 2022-06-24 (×2): qty 90, 90d supply, fill #1
  Filled 2022-08-24: qty 90, 90d supply, fill #2

## 2022-02-06 ENCOUNTER — Other Ambulatory Visit (HOSPITAL_COMMUNITY): Payer: Self-pay

## 2022-02-06 ENCOUNTER — Other Ambulatory Visit: Payer: Self-pay

## 2022-02-06 MED ORDER — THIAMINE HCL 100 MG PO TABS
100.0000 mg | ORAL_TABLET | Freq: Every day | ORAL | 4 refills | Status: AC
  Filled 2022-02-26 – 2022-05-15 (×5): qty 90, 90d supply, fill #0
  Filled 2022-05-18: qty 100, 100d supply, fill #0

## 2022-02-06 MED ORDER — SODIUM CHLORIDE 1 G PO TABS
1.0000 g | ORAL_TABLET | Freq: Two times a day (BID) | ORAL | 3 refills | Status: DC
  Filled 2022-02-26: qty 180, 90d supply, fill #0

## 2022-02-06 MED ORDER — AMLODIPINE BESYLATE 5 MG PO TABS
5.0000 mg | ORAL_TABLET | Freq: Every day | ORAL | 4 refills | Status: DC
  Filled 2022-04-19: qty 90, 90d supply, fill #0
  Filled 2022-06-29: qty 90, 90d supply, fill #1

## 2022-02-06 MED ORDER — CARVEDILOL 25 MG PO TABS
25.0000 mg | ORAL_TABLET | Freq: Two times a day (BID) | ORAL | 1 refills | Status: DC
  Filled 2022-02-06: qty 180, 90d supply, fill #0
  Filled 2022-06-24: qty 180, 90d supply, fill #1

## 2022-02-07 ENCOUNTER — Other Ambulatory Visit: Payer: Self-pay

## 2022-02-07 ENCOUNTER — Other Ambulatory Visit (HOSPITAL_COMMUNITY): Payer: Self-pay

## 2022-02-07 MED ORDER — TRELEGY ELLIPTA 100-62.5-25 MCG/ACT IN AEPB
1.0000 | INHALATION_SPRAY | Freq: Every day | RESPIRATORY_TRACT | 0 refills | Status: DC
  Filled 2022-02-07: qty 60, 30d supply, fill #0

## 2022-02-09 ENCOUNTER — Other Ambulatory Visit (HOSPITAL_COMMUNITY): Payer: Self-pay

## 2022-02-09 ENCOUNTER — Other Ambulatory Visit: Payer: Self-pay

## 2022-02-26 ENCOUNTER — Other Ambulatory Visit: Payer: Self-pay

## 2022-02-26 ENCOUNTER — Other Ambulatory Visit (HOSPITAL_COMMUNITY): Payer: Self-pay

## 2022-02-27 ENCOUNTER — Other Ambulatory Visit: Payer: Self-pay

## 2022-03-02 ENCOUNTER — Other Ambulatory Visit (HOSPITAL_COMMUNITY): Payer: Self-pay

## 2022-03-02 ENCOUNTER — Other Ambulatory Visit: Payer: Self-pay

## 2022-03-02 ENCOUNTER — Other Ambulatory Visit (HOSPITAL_BASED_OUTPATIENT_CLINIC_OR_DEPARTMENT_OTHER): Payer: Self-pay

## 2022-03-02 MED ORDER — XARELTO 2.5 MG PO TABS
2.5000 mg | ORAL_TABLET | Freq: Two times a day (BID) | ORAL | 0 refills | Status: DC
  Filled 2022-03-02 (×2): qty 180, 90d supply, fill #0

## 2022-03-05 ENCOUNTER — Encounter (HOSPITAL_COMMUNITY): Payer: Self-pay | Admitting: Urology

## 2022-03-23 ENCOUNTER — Telehealth: Payer: Self-pay | Admitting: Family Medicine

## 2022-03-23 NOTE — Telephone Encounter (Signed)
Per 3/8 IB reached out to patient to reschedule; patients daughter aware of date and time of appointment.

## 2022-03-27 ENCOUNTER — Inpatient Hospital Stay: Payer: HMO

## 2022-04-03 ENCOUNTER — Inpatient Hospital Stay: Payer: HMO | Admitting: Hematology and Oncology

## 2022-04-19 ENCOUNTER — Other Ambulatory Visit (HOSPITAL_BASED_OUTPATIENT_CLINIC_OR_DEPARTMENT_OTHER): Payer: Self-pay

## 2022-04-19 ENCOUNTER — Other Ambulatory Visit (HOSPITAL_COMMUNITY): Payer: Self-pay

## 2022-04-19 ENCOUNTER — Other Ambulatory Visit: Payer: Self-pay

## 2022-04-19 MED ORDER — PREGABALIN 75 MG PO CAPS
75.0000 mg | ORAL_CAPSULE | Freq: Three times a day (TID) | ORAL | 0 refills | Status: DC
  Filled 2022-04-19 – 2022-05-18 (×2): qty 270, 90d supply, fill #0

## 2022-04-20 ENCOUNTER — Other Ambulatory Visit: Payer: Self-pay

## 2022-04-30 ENCOUNTER — Other Ambulatory Visit (HOSPITAL_BASED_OUTPATIENT_CLINIC_OR_DEPARTMENT_OTHER): Payer: Self-pay

## 2022-05-01 ENCOUNTER — Other Ambulatory Visit: Payer: Self-pay

## 2022-05-01 ENCOUNTER — Other Ambulatory Visit (HOSPITAL_COMMUNITY): Payer: Self-pay

## 2022-05-01 ENCOUNTER — Inpatient Hospital Stay: Payer: HMO | Attending: Hematology and Oncology

## 2022-05-01 ENCOUNTER — Other Ambulatory Visit: Payer: Self-pay | Admitting: Hematology and Oncology

## 2022-05-01 DIAGNOSIS — F17210 Nicotine dependence, cigarettes, uncomplicated: Secondary | ICD-10-CM | POA: Insufficient documentation

## 2022-05-01 DIAGNOSIS — D472 Monoclonal gammopathy: Secondary | ICD-10-CM | POA: Diagnosis present

## 2022-05-01 DIAGNOSIS — D649 Anemia, unspecified: Secondary | ICD-10-CM | POA: Insufficient documentation

## 2022-05-01 LAB — CMP (CANCER CENTER ONLY)
ALT: <5 U/L (ref 0–44)
AST: 9 U/L — ABNORMAL LOW (ref 15–41)
Albumin: 3.4 g/dL — ABNORMAL LOW (ref 3.5–5.0)
Alkaline Phosphatase: 69 U/L (ref 38–126)
Anion gap: 5 (ref 5–15)
BUN: 14 mg/dL (ref 8–23)
CO2: 30 mmol/L (ref 22–32)
Calcium: 9.3 mg/dL (ref 8.9–10.3)
Chloride: 105 mmol/L (ref 98–111)
Creatinine: 0.85 mg/dL (ref 0.61–1.24)
GFR, Estimated: >60 mL/min (ref >60)
Glucose, Bld: 104 mg/dL — ABNORMAL HIGH (ref 70–99)
Potassium: 3.4 mmol/L — ABNORMAL LOW (ref 3.5–5.1)
Sodium: 140 mmol/L (ref 135–145)
Total Bilirubin: 0.5 mg/dL (ref 0.3–1.2)
Total Protein: 6.7 g/dL (ref 6.5–8.1)

## 2022-05-01 LAB — CBC WITH DIFFERENTIAL (CANCER CENTER ONLY)
Abs Immature Granulocytes: 0.01 10*3/uL (ref 0.00–0.07)
Basophils Absolute: 0.1 10*3/uL (ref 0.0–0.1)
Basophils Relative: 1 %
Eosinophils Absolute: 0.2 10*3/uL (ref 0.0–0.5)
Eosinophils Relative: 3 %
HCT: 32.5 % — ABNORMAL LOW (ref 39.0–52.0)
Hemoglobin: 10.6 g/dL — ABNORMAL LOW (ref 13.0–17.0)
Immature Granulocytes: 0 %
Lymphocytes Relative: 18 %
Lymphs Abs: 1.1 10*3/uL (ref 0.7–4.0)
MCH: 29.3 pg (ref 26.0–34.0)
MCHC: 32.6 g/dL (ref 30.0–36.0)
MCV: 89.8 fL (ref 80.0–100.0)
Monocytes Absolute: 0.5 10*3/uL (ref 0.1–1.0)
Monocytes Relative: 9 %
Neutro Abs: 4.1 10*3/uL (ref 1.7–7.7)
Neutrophils Relative %: 69 %
Platelet Count: 177 10*3/uL (ref 150–400)
RBC: 3.62 MIL/uL — ABNORMAL LOW (ref 4.22–5.81)
RDW: 16 % — ABNORMAL HIGH (ref 11.5–15.5)
WBC Count: 5.9 10*3/uL (ref 4.0–10.5)
nRBC: 0 % (ref 0.0–0.2)

## 2022-05-01 LAB — LACTATE DEHYDROGENASE: LDH: 114 U/L (ref 98–192)

## 2022-05-02 LAB — KAPPA/LAMBDA LIGHT CHAINS
Kappa free light chain: 45.4 mg/L — ABNORMAL HIGH (ref 3.3–19.4)
Kappa, lambda light chain ratio: 1.77 — ABNORMAL HIGH (ref 0.26–1.65)
Lambda free light chains: 25.7 mg/L (ref 5.7–26.3)

## 2022-05-04 ENCOUNTER — Other Ambulatory Visit: Payer: Self-pay

## 2022-05-04 ENCOUNTER — Inpatient Hospital Stay (HOSPITAL_BASED_OUTPATIENT_CLINIC_OR_DEPARTMENT_OTHER): Payer: HMO | Admitting: Hematology and Oncology

## 2022-05-04 VITALS — BP 140/48 | HR 56 | Temp 98.5°F | Resp 16 | Wt 146.3 lb

## 2022-05-04 DIAGNOSIS — D472 Monoclonal gammopathy: Secondary | ICD-10-CM

## 2022-05-04 LAB — MULTIPLE MYELOMA PANEL, SERUM
Albumin SerPl Elph-Mcnc: 2.9 g/dL (ref 2.9–4.4)
Albumin/Glob SerPl: 0.9 (ref 0.7–1.7)
Alpha 1: 0.3 g/dL (ref 0.0–0.4)
Alpha2 Glob SerPl Elph-Mcnc: 0.9 g/dL (ref 0.4–1.0)
B-Globulin SerPl Elph-Mcnc: 1 g/dL (ref 0.7–1.3)
Gamma Glob SerPl Elph-Mcnc: 1.1 g/dL (ref 0.4–1.8)
Globulin, Total: 3.3 g/dL (ref 2.2–3.9)
IgA: 478 mg/dL — ABNORMAL HIGH (ref 61–437)
IgG (Immunoglobin G), Serum: 1066 mg/dL (ref 603–1613)
IgM (Immunoglobulin M), Srm: 72 mg/dL (ref 15–143)
M Protein SerPl Elph-Mcnc: 0.5 g/dL — ABNORMAL HIGH
Total Protein ELP: 6.2 g/dL (ref 6.0–8.5)

## 2022-05-04 NOTE — Progress Notes (Signed)
[facility] Telephone:([phone]) [phone]   Fax:([phone]) [phone]  PROGRESS NOTE  Patient Care Team: Mattie Marlin, DO as PCP - General (Family Medicine) Wendall Stade, MD as PCP - Cardiology (Cardiology)  Hematological/Oncological History #IgG Kappa Monoclonal Gammopathy of Undetermined Significance 1) 03/07/2020: Multiple Myeloma Panel revealed M potein 0.5 with IFE showing IgG monoclonal protein with kappa light chain specificity. 2) 04/12/2020: Establish care with Caleb [name]  Interval History:  Caleb [name] 72 y.o. male with medical history significant for IgG Kappa MGUS who presents for a follow up visit. The patient's last visit was on 10/04/2021. In the interim since the last visit he has had no major changes in his health.   Caleb [name] is accompanied by his daughter.  He reports he has a Foley in place because he has been having difficulty with bladder issues.  He has been in for approximate 3 months time.  He notes he has had no other changes in his health and no new medications.  His energy level is about the same.  He has no new bone or back pain.  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  He reports that he does not notice any changes in his urine such as foaming or bubbling.  His bowels have been moving well every other day.  Other than the Foley catheter he is at his baseline level of health.  Overall he feels well.  He denies any nausea, vomiting, or diarrhea.  He otherwise reports that he feels well with no new symptoms.  A full 10 point ROS is listed below.  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   LIMA to LAD, RIMA to RCA, SVG to  OM1, EVH via right thigh   Tobacco abuse     SURGICAL HISTORY: Past Surgical History:  Procedure Laterality Date   BIOPSY  10/31/2018   Procedure: BIOPSY;  Surgeon: Jeani Hawking, MD;  Location: [facility];  Service: Endoscopy;;   cataract surgery  Bilateral 12-2017 and 01-2018   COLONOSCOPY WITH PROPOFOL N/A 10/31/2018   Procedure: COLONOSCOPY WITH PROPOFOL;  Surgeon: Jeani Hawking, MD;  Location: [facility];  Service: Endoscopy;  Laterality: N/A;   COLONOSCOPY WITH PROPOFOL N/A 09/18/2019   Procedure: COLONOSCOPY WITH PROPOFOL;  Surgeon: Jeani Hawking, MD;  Location: [facility];  Service: Endoscopy;  Laterality: N/A;   COLONOSCOPY WITH PROPOFOL N/A 01/20/2021   Procedure: COLONOSCOPY WITH PROPOFOL;  Surgeon: Jeani Hawking, MD;  Location: [facility];  Service: Endoscopy;  Laterality: N/A;   COLONOSCOPY WITH PROPOFOL N/A 11/03/2021   Procedure: COLONOSCOPY WITH PROPOFOL;  Surgeon: Jeani Hawking, MD;  Location: [facility];  Service: Gastroenterology;  Laterality: N/A;   CORONARY ARTERY BYPASS GRAFT N/A 02/17/2014   Procedure: CORONARY ARTERY BYPASS GRAFTING (CABG);  Surgeon: Purcell Nails, MD;  [facility];  Service: Open Heart Surgery;  Laterality: N/A;  Times 3 using bilateral mammary arteries and endoscopically harvested right saphenous vein   ESOPHAGOGASTRODUODENOSCOPY (EGD) WITH PROPOFOL N/A 10/31/2018   Procedure: ESOPHAGOGASTRODUODENOSCOPY (EGD) WITH PROPOFOL;  Surgeon: Jeani Hawking, MD;  Location: [facility];  Service: Endoscopy;  Laterality: N/A;   ESOPHAGOGASTRODUODENOSCOPY (EGD)  WITH PROPOFOL N/A 09/18/2019   Procedure: ESOPHAGOGASTRODUODENOSCOPY (EGD) WITH PROPOFOL;  Surgeon: Jeani Hawking, MD;  Location: [facility];  Service: Endoscopy;  Laterality: N/A;   FEMORAL ARTERY - FEMORAL ARTERY BYPASS GRAFT      right femoral artery to below-knee popliteal artery bypass with PTFE and right first ray amputation 04/25/17   HEMOSTASIS CLIP PLACEMENT  10/31/2018   Procedure:  HEMOSTASIS CLIP PLACEMENT;  Surgeon: Jeani Hawking, MD;  [facility]: [facility];  Service: Endoscopy;;   HEMOSTASIS CLIP PLACEMENT  09/18/2019   Procedure: HEMOSTASIS CLIP PLACEMENT;  Surgeon: Jeani Hawking, MD;  [facility]: [facility];  Service: Endoscopy;;   HEMOSTASIS CLIP PLACEMENT  01/20/2021   Procedure: HEMOSTASIS CLIP PLACEMENT;  Surgeon: Jeani Hawking, MD;  [facility]: [facility];  Service: Endoscopy;;   INTRAOPERATIVE TRANSESOPHAGEAL ECHOCARDIOGRAM N/A 02/17/2014   Procedure: INTRAOPERATIVE TRANSESOPHAGEAL ECHOCARDIOGRAM;  Surgeon: Purcell Nails, MD;  Location: [facility];  Service: Open Heart Surgery;  Laterality: N/A;   LEFT HEART CATHETERIZATION WITH CORONARY ANGIOGRAM N/A 02/12/2014   Procedure: LEFT HEART CATHETERIZATION WITH CORONARY ANGIOGRAM;  Surgeon: Lesleigh Noe, MD;  Location: [facility] CATH LAB;  Service: Cardiovascular;  Laterality: N/A;   POLYPECTOMY  10/31/2018   Procedure: POLYPECTOMY;  Surgeon: Jeani Hawking, MD;  [facility]: [facility];  Service: Endoscopy;;   POLYPECTOMY  09/18/2019   Procedure: POLYPECTOMY;  Surgeon: Jeani Hawking, MD;  [facility]: [facility];  Service: Endoscopy;;   POLYPECTOMY  01/20/2021   Procedure: POLYPECTOMY;  Surgeon: Jeani Hawking, MD;  [facility]: [facility];  Service: Endoscopy;;   POLYPECTOMY  11/03/2021   Procedure: POLYPECTOMY;  Surgeon: Jeani Hawking, MD;  [facility];  Service: Gastroenterology;;   Susa Day  09/18/2019   Procedure: Susa Day;  Surgeon: Jeani Hawking, MD;  Location: [facility];  Service: Endoscopy;;   SUBMUCOSAL INJECTION  09/18/2019   Procedure: SUBMUCOSAL INJECTION;  Surgeon: Jeani Hawking, MD;  Location: [facility];  Service: Endoscopy;;   SUBMUCOSAL LIFTING INJECTION  10/31/2018   Procedure: SUBMUCOSAL LIFTING INJECTION;  Surgeon: Jeani Hawking, MD;  [facility]: [facility];  Service: Endoscopy;;   TOE AMPUTATION Right    right great toe     SOCIAL HISTORY: Social History   Socioeconomic History    Marital status: Single    Spouse name: Not on file   Number of children: Not on file   Years of education: Not on file   Highest education level: Not on file  Occupational History   Occupation: metal work  Tobacco Use   Smoking status: Every Day    Packs/day: 0.50    Years: 35.00    Additional pack years: 0.00    Total pack years: 17.50    Types: Cigarettes    Last attempt to quit: 02/11/2014    Years since quitting: 8.2   Smokeless tobacco: Never   Tobacco comments:    has quit twice before   Vaping Use   Vaping Use: Never used  Substance and Sexual Activity   Alcohol use: Yes    Alcohol/week: 0.0 standard drinks of alcohol    Comment: Six pack a day of beer   Drug use: Never   Sexual activity: Not on file  Other Topics Concern   Not on file  Social History Narrative   Not on file   Social Determinants of Health   Financial Resource Strain: Not on file  Food Insecurity: Not on file  Transportation Needs: Not on file  Physical Activity: Not on file  Stress: Not on file  Social Connections:  Not on file  Intimate Partner Violence: Not on file    FAMILY HISTORY: Family History  Problem Relation Age of Onset   Heart attack Mother    Diabetes Mother    Heart attack Brother    Diabetes Brother    Cancer Sister    Stroke Neg Hx     ALLERGIES:  is allergic to bactrim [sulfamethoxazole-trimethoprim], lisinopril, losartan, and pletal [cilostazol].  MEDICATIONS:  Current Outpatient Medications  Medication Sig Dispense Refill   acetaminophen (TYLENOL) 500 MG tablet Take 1,000 mg by mouth 2 (two) times daily as needed for mild pain or moderate pain.      albuterol (VENTOLIN HFA) 108 (90 Base) MCG/ACT inhaler Inhale 2 puffs into the lungs every 6 (six) hours as needed for wheezing or shortness of breath. 18 g 3   alendronate (FOSAMAX) 70 MG tablet Take 70 mg by mouth every Saturday.     alendronate (FOSAMAX) 70 MG tablet Take 1 tablet (70 mg total) by mouth once a week.  90 tablet 3   amLODipine (NORVASC) 5 MG tablet Take 5 mg by mouth daily.     amLODipine (NORVASC) 5 MG tablet Take 1 tablet (5 mg total) by mouth daily. 90 tablet 4   aspirin EC 81 MG tablet Take 81 mg by mouth daily. Swallow whole.     atorvastatin (LIPITOR) 80 MG tablet Take 1 tablet (80 mg total) by mouth daily. 90 tablet 3   atorvastatin (LIPITOR) 80 MG tablet Take 1 tablet (80 mg total) by mouth daily for cholesterol 90 tablet 1   benzonatate (TESSALON) 100 MG capsule Take 1 capsule (100 mg total) by mouth 2 (two) times daily as needed for cough. 20 capsule 0   carvedilol (COREG) 25 MG tablet Take 25 mg by mouth 2 (two) times daily with a meal.     carvedilol (COREG) 25 MG tablet Take 1 tablet (25 mg total) by mouth 2 (two) times daily with a meal. 180 tablet 1   Cholecalciferol (VITAMIN D-3) 25 MCG (1000 UT) CAPS Take 1,000 Units by mouth daily with breakfast.     ferrous sulfate 325 (65 FE) MG tablet Take 325 mg by mouth daily with breakfast.     ferrous sulfate 325 (65 FE) MG tablet Take 1 tablet (325 mg total) by mouth daily. 90 tablet 4   fexofenadine (ALLEGRA) 180 MG tablet Take 180 mg by mouth daily.     Fluticasone-Umeclidin-Vilant (TRELEGY ELLIPTA) 100-62.5-25 MCG/ACT AEPB Take 1 puff by mouth daily. 60 each 0   folic acid (FOLVITE) 1 MG tablet Take 1 mg by mouth daily.     folic acid (FOLVITE) 1 MG tablet Take 1 tablet (1 mg total) by mouth daily. 90 tablet 4   irbesartan (AVAPRO) 300 MG tablet Take 300 mg by mouth daily.     irbesartan (AVAPRO) 300 MG tablet Take 1 tablet (300 mg total) by mouth daily. 90 tablet 4   nitroGLYCERIN (NITROSTAT) 0.4 MG SL tablet Place 0.4 mg under the tongue every 5 (five) minutes as needed for chest pain.     Olopatadine HCl 0.2 % SOLN Place 1 drop into both eyes daily as needed (allergies).     pantoprazole (PROTONIX) 40 MG tablet Take 40 mg by mouth daily.     pantoprazole (PROTONIX) 40 MG tablet Take 1 tablet (40 mg total) by mouth daily. 90  tablet 4   pregabalin (LYRICA) 75 MG capsule Take 75 mg by mouth 3 (three) times daily.  pregabalin (LYRICA) 75 MG capsule Take 1 capsule (75 mg total) by mouth 3 (three) times a day. 270 capsule 0   rivaroxaban (XARELTO) 2.5 MG TABS tablet Take 2.5 mg by mouth 2 (two) times daily.     sodium chloride 1 g tablet Take 1 g by mouth 2 (two) times daily with a meal.     sodium chloride 1 g tablet Take 1 tablet (1 g total) by mouth 2 (two) times daily. 180 tablet 3   tamsulosin (FLOMAX) 0.4 MG CAPS capsule Take 0.4 mg by mouth every evening.     tamsulosin (FLOMAX) 0.4 MG CAPS capsule Take 1 capsule (0.4 mg total) by mouth daily. 30 capsule 11   thiamine (VITAMIN B1) 100 MG tablet Take 100 mg by mouth daily.     thiamine (VITAMIN B1) 100 MG tablet Take 1 tablet (100 mg total) by mouth daily. 90 tablet 4   vitamin B-12 (CYANOCOBALAMIN) 1000 MCG tablet Take 1,000 mcg by mouth daily with breakfast.     XARELTO 2.5 MG TABS tablet Take 1 tablet (2.5 mg total) by mouth 2 (two) times daily. 180 tablet 0   No current facility-administered medications for this visit.    REVIEW OF SYSTEMS:   Constitutional: ( - ) fevers, ( - )  chills , ( - ) night sweats Eyes: ( - ) blurriness of vision, ( - ) double vision, ( - ) watery eyes Ears, nose, mouth, throat, and face: ( - ) mucositis, ( - ) sore throat Respiratory: ( - ) cough, ( - ) dyspnea, ( - ) wheezes Cardiovascular: ( - ) palpitation, ( - ) chest discomfort, ( - ) lower extremity swelling Gastrointestinal:  ( - ) nausea, ( - ) heartburn, ( - ) change in bowel habits Skin: ( - ) abnormal skin rashes Lymphatics: ( - ) new lymphadenopathy, ( - ) easy bruising Neurological: ( - ) numbness, ( - ) tingling, ( - ) new weaknesses Behavioral/Psych: ( - ) mood change, ( - ) new changes  All other systems were reviewed with the patient and are negative.  PHYSICAL EXAMINATION: ECOG PERFORMANCE STATUS: 0 - Asymptomatic  Vitals:   05/04/22 0818  BP: (!)  140/48  Pulse: (!) 56  Resp: 16  Temp: 98.5 F (36.9 C)  SpO2: 100%     Filed Weights   05/04/22 0818  Weight: 146 lb 4.8 oz (66.4 kg)      GENERAL: Well-appearing elderly male, alert, no distress and comfortable SKIN: skin color, texture, turgor are normal, no rashes or significant lesions EYES: conjunctiva are pink and non-injected, sclera clear LUNGS: clear to auscultation and percussion with normal breathing effort HEART: regular rate & rhythm and no murmurs and no lower extremity edema PSYCH: alert & oriented x 3, fluent speech NEURO: no focal motor/sensory deficits  LABORATORY DATA:  I have reviewed the data as listed    Latest Ref Rng & Units 05/01/2022   10:22 AM 01/19/2022    3:46 AM 01/18/2022    3:29 AM  CBC  WBC 4.0 - 10.5 K/uL 5.9  7.0  7.6   Hemoglobin 13.0 - 17.0 g/dL 11.9  9.6  9.5   Hematocrit 39.0 - 52.0 % 32.5  29.5  28.9   Platelets 150 - 400 K/uL 177  207  210        Latest Ref Rng & Units 05/01/2022   10:22 AM 01/19/2022    3:46 AM 01/18/2022    3:29 AM  CMP  Glucose 70 - 99 mg/dL 409  811  914   BUN 8 - 23 mg/dL 14  13  11    Creatinine 0.61 - 1.24 mg/dL 7.82  9.56  2.13   Sodium 135 - 145 mmol/L 140  131  131   Potassium 3.5 - 5.1 mmol/L 3.4  4.0  4.1   Chloride 98 - 111 mmol/L 105  102  101   CO2 22 - 32 mmol/L 30  24  24    Calcium 8.9 - 10.3 mg/dL 9.3  8.0  8.2   Total Protein 6.5 - 8.1 g/dL 6.7     Total Bilirubin 0.3 - 1.2 mg/dL 0.5     Alkaline Phos 38 - 126 U/L 69     AST 15 - 41 U/L 9     ALT 0 - 44 U/L <5       Lab Results  Component Value Date   MPROTEIN 0.5 (H) 05/01/2022   MPROTEIN 0.5 (H) 09/11/2021   MPROTEIN 0.3 (H) 01/30/2021   Lab Results  Component Value Date   KPAFRELGTCHN 45.4 (H) 05/01/2022   KPAFRELGTCHN 38.3 (H) 09/11/2021   KPAFRELGTCHN 38.3 (H) 01/30/2021   LAMBDASER 25.7 05/01/2022   LAMBDASER 25.1 09/11/2021   LAMBDASER 25.4 01/30/2021   KAPLAMBRATIO 1.77 (H) 05/01/2022   KAPLAMBRATIO 7.40 11/02/2021    KAPLAMBRATIO 1.53 09/11/2021    RADIOGRAPHIC STUDIES: No results found.  ASSESSMENT & PLAN CLANTON EMANUELSON 72 y.o. male with medical history significant for IgG Kappa MGUS who presents for a follow up visit.   #IgG Kappa Monoclonal Gammopathy of Undetermined Significance --SPEP with IFE from 05/01/22 showed M-spike of 0.5, IgG monoclonal protein with kappa light chain specificity. K/L ratio is mildly elevated at 1.77 with no CRAB criteria.  --today will repeat CBC w/ diff, CMP, LDH, kappa/lambda light chains. Labs show WBC 5.9, hemoglobin 10.6, MCV 89.8, and platelets of 177 --Mild increase in protein in the urine.  We will repeat UPEP on a yearly basis, last performed in April 2022.  --DG bone met survey showed no lytic lesions.  Repeat this on a yearly basis. Last performed in  Oct 2023 -- No indication for a bone marrow biopsy based on above results. --RTC in 12 months with labs unless further workup is needed.   # Mild Anemia/Thrombocytopenia -- Thrombocytopenia resolved, down to 177 -- Hemoglobin 10.6 today, uptrending from prior hospitalization in January.  No orders of the defined types were placed in this encounter.    All questions were answered. The patient knows to call the clinic with any problems, questions or concerns.  A total of more than 30 minutes were spent on this encounter with face-to-face time and non-face-to-face time, including preparing to see the patient, ordering tests and/or medications, counseling the patient and coordination of care as outlined above.   Ulysees Barns, MD Department of Hematology/Oncology [facility] Phone: [phone] Pager: [phone] Email: [email or web address]  05/04/2022 5:19 PM

## 2022-05-05 ENCOUNTER — Other Ambulatory Visit (HOSPITAL_COMMUNITY): Payer: Self-pay

## 2022-05-15 ENCOUNTER — Other Ambulatory Visit (HOSPITAL_COMMUNITY): Payer: Self-pay

## 2022-05-17 ENCOUNTER — Other Ambulatory Visit (HOSPITAL_COMMUNITY): Payer: Self-pay

## 2022-05-18 ENCOUNTER — Other Ambulatory Visit: Payer: Self-pay

## 2022-05-18 ENCOUNTER — Other Ambulatory Visit (HOSPITAL_COMMUNITY): Payer: Self-pay

## 2022-05-21 ENCOUNTER — Other Ambulatory Visit (HOSPITAL_COMMUNITY): Payer: Self-pay

## 2022-05-29 ENCOUNTER — Other Ambulatory Visit (HOSPITAL_COMMUNITY): Payer: Self-pay

## 2022-06-12 ENCOUNTER — Other Ambulatory Visit (HOSPITAL_COMMUNITY): Payer: Self-pay

## 2022-06-14 ENCOUNTER — Other Ambulatory Visit (HOSPITAL_COMMUNITY): Payer: Self-pay

## 2022-06-24 ENCOUNTER — Other Ambulatory Visit (HOSPITAL_COMMUNITY): Payer: Self-pay

## 2022-06-25 ENCOUNTER — Other Ambulatory Visit: Payer: Self-pay

## 2022-06-25 ENCOUNTER — Other Ambulatory Visit (HOSPITAL_COMMUNITY): Payer: Self-pay

## 2022-06-25 MED ORDER — XARELTO 2.5 MG PO TABS
2.5000 mg | ORAL_TABLET | Freq: Two times a day (BID) | ORAL | 0 refills | Status: DC
  Filled 2022-06-25: qty 60, 30d supply, fill #0
  Filled 2022-08-24 – 2023-03-11 (×2): qty 60, 30d supply, fill #1
  Filled 2023-04-05: qty 60, 30d supply, fill #2

## 2022-06-26 ENCOUNTER — Other Ambulatory Visit: Payer: Self-pay

## 2022-06-26 ENCOUNTER — Other Ambulatory Visit (HOSPITAL_COMMUNITY): Payer: Self-pay

## 2022-06-27 ENCOUNTER — Other Ambulatory Visit (HOSPITAL_COMMUNITY): Payer: Self-pay

## 2022-06-29 ENCOUNTER — Other Ambulatory Visit: Payer: Self-pay

## 2022-06-29 ENCOUNTER — Other Ambulatory Visit (HOSPITAL_COMMUNITY): Payer: Self-pay

## 2022-07-02 ENCOUNTER — Other Ambulatory Visit: Payer: Self-pay

## 2022-07-02 ENCOUNTER — Other Ambulatory Visit (HOSPITAL_COMMUNITY): Payer: Self-pay

## 2022-07-05 ENCOUNTER — Other Ambulatory Visit (HOSPITAL_COMMUNITY): Payer: Self-pay

## 2022-07-13 ENCOUNTER — Other Ambulatory Visit: Payer: Self-pay

## 2022-07-13 ENCOUNTER — Other Ambulatory Visit (HOSPITAL_COMMUNITY): Payer: Self-pay

## 2022-07-13 ENCOUNTER — Other Ambulatory Visit (HOSPITAL_BASED_OUTPATIENT_CLINIC_OR_DEPARTMENT_OTHER): Payer: Self-pay

## 2022-07-13 MED ORDER — ATORVASTATIN CALCIUM 80 MG PO TABS
80.0000 mg | ORAL_TABLET | Freq: Every day | ORAL | 1 refills | Status: DC
  Filled 2022-07-13: qty 90, 90d supply, fill #0
  Filled 2022-09-29: qty 90, 90d supply, fill #1

## 2022-07-31 ENCOUNTER — Other Ambulatory Visit (HOSPITAL_BASED_OUTPATIENT_CLINIC_OR_DEPARTMENT_OTHER): Payer: Self-pay

## 2022-07-31 MED ORDER — PREGABALIN 75 MG PO CAPS
75.0000 mg | ORAL_CAPSULE | Freq: Three times a day (TID) | ORAL | 0 refills | Status: DC
  Filled 2022-07-31 – 2022-08-27 (×3): qty 270, 90d supply, fill #0

## 2022-08-20 ENCOUNTER — Other Ambulatory Visit (HOSPITAL_BASED_OUTPATIENT_CLINIC_OR_DEPARTMENT_OTHER): Payer: Self-pay

## 2022-08-24 ENCOUNTER — Encounter (HOSPITAL_COMMUNITY): Payer: Self-pay

## 2022-08-24 ENCOUNTER — Other Ambulatory Visit (HOSPITAL_COMMUNITY): Payer: Self-pay

## 2022-08-25 ENCOUNTER — Other Ambulatory Visit (HOSPITAL_COMMUNITY): Payer: Self-pay

## 2022-08-27 ENCOUNTER — Other Ambulatory Visit: Payer: Self-pay

## 2022-08-29 ENCOUNTER — Other Ambulatory Visit: Payer: Self-pay

## 2022-08-29 ENCOUNTER — Other Ambulatory Visit (HOSPITAL_COMMUNITY): Payer: Self-pay

## 2022-09-05 ENCOUNTER — Other Ambulatory Visit (HOSPITAL_COMMUNITY): Payer: Self-pay

## 2022-09-06 ENCOUNTER — Other Ambulatory Visit (HOSPITAL_COMMUNITY): Payer: Self-pay

## 2022-09-06 MED ORDER — FOLIC ACID 1 MG PO TABS
1.0000 mg | ORAL_TABLET | Freq: Every day | ORAL | 4 refills | Status: DC
  Filled 2022-09-06: qty 90, 90d supply, fill #0
  Filled 2022-11-30: qty 90, 90d supply, fill #1
  Filled 2023-03-11: qty 90, 90d supply, fill #2
  Filled 2023-06-04: qty 90, 90d supply, fill #3
  Filled 2023-08-02: qty 90, 90d supply, fill #4

## 2022-09-06 MED ORDER — ALENDRONATE SODIUM 70 MG PO TABS
70.0000 mg | ORAL_TABLET | ORAL | 3 refills | Status: DC
  Filled 2022-09-06: qty 12, 84d supply, fill #0
  Filled 2022-11-30: qty 12, 84d supply, fill #1
  Filled 2023-03-11: qty 12, 84d supply, fill #2
  Filled 2023-05-23: qty 12, 84d supply, fill #3
  Filled 2023-08-02: qty 12, 84d supply, fill #4

## 2022-09-29 ENCOUNTER — Other Ambulatory Visit (HOSPITAL_COMMUNITY): Payer: Self-pay

## 2022-10-01 ENCOUNTER — Other Ambulatory Visit (HOSPITAL_COMMUNITY): Payer: Self-pay

## 2022-10-01 ENCOUNTER — Other Ambulatory Visit: Payer: Self-pay

## 2022-10-01 MED ORDER — IRBESARTAN 300 MG PO TABS
300.0000 mg | ORAL_TABLET | Freq: Every day | ORAL | 0 refills | Status: DC
  Filled 2022-10-01: qty 90, 90d supply, fill #0

## 2022-10-01 MED ORDER — CARVEDILOL 25 MG PO TABS
25.0000 mg | ORAL_TABLET | Freq: Two times a day (BID) | ORAL | 0 refills | Status: DC
  Filled 2022-10-01: qty 180, 90d supply, fill #0

## 2022-10-01 MED ORDER — PANTOPRAZOLE SODIUM 40 MG PO TBEC
40.0000 mg | DELAYED_RELEASE_TABLET | Freq: Every day | ORAL | 0 refills | Status: DC
  Filled 2022-10-01: qty 90, 90d supply, fill #0

## 2022-10-04 ENCOUNTER — Inpatient Hospital Stay (HOSPITAL_COMMUNITY)
Admission: EM | Admit: 2022-10-04 | Discharge: 2022-10-08 | DRG: 871 | Disposition: A | Discharge status: Home Health | Payer: HMO | Attending: Internal Medicine | Admitting: Internal Medicine

## 2022-10-04 ENCOUNTER — Emergency Department (HOSPITAL_COMMUNITY): Payer: HMO

## 2022-10-04 DIAGNOSIS — Z882 Allergy status to sulfonamides status: Secondary | ICD-10-CM

## 2022-10-04 DIAGNOSIS — L97829 Non-pressure chronic ulcer of other part of left lower leg with unspecified severity: Secondary | ICD-10-CM | POA: Diagnosis present

## 2022-10-04 DIAGNOSIS — M4802 Spinal stenosis, cervical region: Secondary | ICD-10-CM | POA: Diagnosis present

## 2022-10-04 DIAGNOSIS — D509 Iron deficiency anemia, unspecified: Secondary | ICD-10-CM | POA: Diagnosis present

## 2022-10-04 DIAGNOSIS — B967 Clostridium perfringens [C. perfringens] as the cause of diseases classified elsewhere: Secondary | ICD-10-CM | POA: Diagnosis present

## 2022-10-04 DIAGNOSIS — J449 Chronic obstructive pulmonary disease, unspecified: Secondary | ICD-10-CM | POA: Diagnosis present

## 2022-10-04 DIAGNOSIS — Z7901 Long term (current) use of anticoagulants: Secondary | ICD-10-CM

## 2022-10-04 DIAGNOSIS — Z7983 Long term (current) use of bisphosphonates: Secondary | ICD-10-CM

## 2022-10-04 DIAGNOSIS — I110 Hypertensive heart disease with heart failure: Secondary | ICD-10-CM | POA: Diagnosis present

## 2022-10-04 DIAGNOSIS — R7401 Elevation of levels of liver transaminase levels: Secondary | ICD-10-CM

## 2022-10-04 DIAGNOSIS — I2510 Atherosclerotic heart disease of native coronary artery without angina pectoris: Secondary | ICD-10-CM | POA: Diagnosis present

## 2022-10-04 DIAGNOSIS — J690 Pneumonitis due to inhalation of food and vomit: Principal | ICD-10-CM | POA: Diagnosis present

## 2022-10-04 DIAGNOSIS — N3001 Acute cystitis with hematuria: Secondary | ICD-10-CM | POA: Diagnosis not present

## 2022-10-04 DIAGNOSIS — Z951 Presence of aortocoronary bypass graft: Secondary | ICD-10-CM

## 2022-10-04 DIAGNOSIS — M8668 Other chronic osteomyelitis, other site: Secondary | ICD-10-CM | POA: Diagnosis present

## 2022-10-04 DIAGNOSIS — Z955 Presence of coronary angioplasty implant and graft: Secondary | ICD-10-CM

## 2022-10-04 DIAGNOSIS — I10 Essential (primary) hypertension: Secondary | ICD-10-CM | POA: Diagnosis present

## 2022-10-04 DIAGNOSIS — Z8249 Family history of ischemic heart disease and other diseases of the circulatory system: Secondary | ICD-10-CM

## 2022-10-04 DIAGNOSIS — F17210 Nicotine dependence, cigarettes, uncomplicated: Secondary | ICD-10-CM | POA: Diagnosis present

## 2022-10-04 DIAGNOSIS — B9689 Other specified bacterial agents as the cause of diseases classified elsewhere: Secondary | ICD-10-CM | POA: Diagnosis present

## 2022-10-04 DIAGNOSIS — Z888 Allergy status to other drugs, medicaments and biological substances status: Secondary | ICD-10-CM

## 2022-10-04 DIAGNOSIS — E871 Hypo-osmolality and hyponatremia: Secondary | ICD-10-CM | POA: Diagnosis present

## 2022-10-04 DIAGNOSIS — A419 Sepsis, unspecified organism: Principal | ICD-10-CM | POA: Diagnosis present

## 2022-10-04 DIAGNOSIS — R7881 Bacteremia: Secondary | ICD-10-CM | POA: Diagnosis present

## 2022-10-04 DIAGNOSIS — B9620 Unspecified Escherichia coli [E. coli] as the cause of diseases classified elsewhere: Secondary | ICD-10-CM | POA: Diagnosis present

## 2022-10-04 DIAGNOSIS — Z1152 Encounter for screening for COVID-19: Secondary | ICD-10-CM

## 2022-10-04 DIAGNOSIS — E876 Hypokalemia: Secondary | ICD-10-CM | POA: Diagnosis not present

## 2022-10-04 DIAGNOSIS — N390 Urinary tract infection, site not specified: Secondary | ICD-10-CM | POA: Insufficient documentation

## 2022-10-04 DIAGNOSIS — M8660 Other chronic osteomyelitis, unspecified site: Secondary | ICD-10-CM | POA: Diagnosis present

## 2022-10-04 DIAGNOSIS — Z881 Allergy status to other antibiotic agents status: Secondary | ICD-10-CM

## 2022-10-04 DIAGNOSIS — E7800 Pure hypercholesterolemia, unspecified: Secondary | ICD-10-CM | POA: Diagnosis present

## 2022-10-04 DIAGNOSIS — Z833 Family history of diabetes mellitus: Secondary | ICD-10-CM

## 2022-10-04 DIAGNOSIS — E872 Acidosis, unspecified: Secondary | ICD-10-CM | POA: Diagnosis present

## 2022-10-04 DIAGNOSIS — I739 Peripheral vascular disease, unspecified: Secondary | ICD-10-CM

## 2022-10-04 DIAGNOSIS — Z79899 Other long term (current) drug therapy: Secondary | ICD-10-CM

## 2022-10-04 DIAGNOSIS — R5381 Other malaise: Secondary | ICD-10-CM | POA: Diagnosis present

## 2022-10-04 DIAGNOSIS — K760 Fatty (change of) liver, not elsewhere classified: Secondary | ICD-10-CM | POA: Diagnosis present

## 2022-10-04 DIAGNOSIS — D696 Thrombocytopenia, unspecified: Secondary | ICD-10-CM | POA: Diagnosis present

## 2022-10-04 DIAGNOSIS — I5022 Chronic systolic (congestive) heart failure: Secondary | ICD-10-CM | POA: Diagnosis present

## 2022-10-04 DIAGNOSIS — G8929 Other chronic pain: Secondary | ICD-10-CM | POA: Diagnosis present

## 2022-10-04 DIAGNOSIS — Z7982 Long term (current) use of aspirin: Secondary | ICD-10-CM

## 2022-10-04 DIAGNOSIS — Z720 Tobacco use: Secondary | ICD-10-CM | POA: Diagnosis present

## 2022-10-04 LAB — I-STAT VENOUS BLOOD GAS, ED
Acid-Base Excess: 0 mmol/L (ref 0.0–2.0)
Bicarbonate: 25.1 mmol/L (ref 20.0–28.0)
Calcium, Ion: 1.17 mmol/L (ref 1.15–1.40)
HCT: 39 % (ref 39.0–52.0)
Hemoglobin: 13.3 g/dL (ref 13.0–17.0)
O2 Saturation: 55 %
Potassium: 4.1 mmol/L (ref 3.5–5.1)
Sodium: 135 mmol/L (ref 135–145)
TCO2: 26 mmol/L (ref 22–32)
pCO2, Ven: 42.7 mmHg — ABNORMAL LOW (ref 44–60)
pH, Ven: 7.377 (ref 7.25–7.43)
pO2, Ven: 30 mmHg — CL (ref 32–45)

## 2022-10-04 LAB — I-STAT CG4 LACTIC ACID, ED: Lactic Acid, Venous: 2.1 mmol/L (ref 0.5–1.9)

## 2022-10-04 MED ORDER — ACETAMINOPHEN 325 MG PO TABS
650.0000 mg | ORAL_TABLET | Freq: Every day | ORAL | Status: DC
  Administered 2022-10-05 – 2022-10-08 (×5): 650 mg via ORAL
  Filled 2022-10-04 (×6): qty 2

## 2022-10-04 MED ORDER — ONDANSETRON HCL 4 MG/2ML IJ SOLN
4.0000 mg | Freq: Once | INTRAMUSCULAR | Status: AC
  Administered 2022-10-05: 4 mg via INTRAVENOUS
  Filled 2022-10-04: qty 2

## 2022-10-04 MED ORDER — SODIUM CHLORIDE 0.9 % IV BOLUS
500.0000 mL | Freq: Once | INTRAVENOUS | Status: AC
  Administered 2022-10-05: 500 mL via INTRAVENOUS

## 2022-10-04 NOTE — ED Provider Notes (Signed)
[place] EMERGENCY DEPARTMENT AT [facility] ([facility]) Provider Note   CSN: [id number] Arrival date & time: 10/04/22  2249     History  Chief Complaint  Patient presents with   Shortness of Breath    Patient to ED via EMS with complaint of sob, runny nose, and flu like symptoms x 1-1/2 hours. EMS reports patient was 95% on room air. EMS reports family of patient all report recent flu like symptoms.    Caleb [name] is a 72 y.o. male.  HPI   Patient with medical history including CAD/CABG in 2016, PVD, femoropopliteal and bilateral stents for nonhealing left heel ulcer currently on Xarelto due to PVD, hypertension, hyperlipidemia, presenting with concerns of shortness of breath.  Patient states that over the last few days she has been having a nonproductive cough, states then today he felt short of breath, he denies any actual chest pain or chest tightness, denies any pleuritic chest pain, no history of PEs, he is currently on Xarelto, states he has been compliant, no associated fevers chills, denies any recent sick contacts no stomach pain no nausea or vomiting, states he has been eating drink without difficulty, he denies any urinary symptoms.  Spoke with patient's daughter who informs he that the patient called her earlier today because he felt short of breath, they provided him with his inhaler without much relief, so they called EMS.  She states that he lives home alone, ambulates on his own, compliant with his medications, unaware of any known illnesses at this time.  Home Medications Prior to Admission medications   Medication Sig Start Date End Date Taking? Authorizing Provider  acetaminophen (TYLENOL) 500 MG tablet Take 1,000 mg by mouth 2 (two) times daily as needed for mild pain or moderate pain.     [provider]  albuterol (VENTOLIN HFA) 108 (90 Base) MCG/ACT inhaler Inhale 2 puffs into the lungs every 6 (six) hours as needed for wheezing or shortness of breath.  07/24/18   Tomma Lightning, MD  alendronate (FOSAMAX) 70 MG tablet Take 1 tablet (70 mg total) by mouth once a week. 09/06/22     amLODipine (NORVASC) 5 MG tablet Take 1 tablet (5 mg total) by mouth daily. 08/24/21     aspirin EC 81 MG tablet Take 81 mg by mouth daily. Swallow whole.    [provider]  atorvastatin (LIPITOR) 80 MG tablet Take 1 tablet (80 mg total) by mouth daily for cholesterol 07/13/22     carvedilol (COREG) 25 MG tablet Take 1 tablet (25 mg total) by mouth 2 (two) times daily with a meal. 10/01/22     Cholecalciferol (VITAMIN D-3) 25 MCG (1000 UT) CAPS Take 1,000 Units by mouth daily with breakfast.    [provider]  ferrous sulfate 325 (65 FE) MG tablet Take 1 tablet (325 mg total) by mouth daily. 08/24/21   Barbie Banner, MD  fexofenadine (ALLEGRA) 180 MG tablet Take 180 mg by mouth daily.    [provider]  Fluticasone-Umeclidin-Vilant (TRELEGY ELLIPTA) 100-62.5-25 MCG/ACT AEPB Take 1 puff by mouth daily. 11/16/21     folic acid (FOLVITE) 1 MG tablet Take 1 tablet (1 mg total) by mouth daily. 09/06/22     irbesartan (AVAPRO) 300 MG tablet Take 1 tablet (300 mg total) by mouth daily. 10/01/22     nitroGLYCERIN (NITROSTAT) 0.4 MG SL tablet Place 0.4 mg under the tongue every 5 (five) minutes as needed for chest pain.  [provider]  Olopatadine HCl 0.2 % SOLN Place 1 drop into both eyes daily as needed (allergies).    [provider]  pantoprazole (PROTONIX) 40 MG tablet Take 1 tablet (40 mg total) by mouth daily. 10/01/22     pregabalin (LYRICA) 75 MG capsule Take 1 capsule (75 mg total) by mouth 3 (three) times daily. 07/31/22     rivaroxaban (XARELTO) 2.5 MG TABS tablet Take 2.5 mg by mouth 2 (two) times daily.    [provider]  sodium chloride 1 g tablet Take 1 tablet (1 g total) by mouth 2 (two) times daily. 01/30/22     tamsulosin (FLOMAX) 0.4 MG CAPS capsule Take 1 capsule (0.4 mg total) by mouth daily. 12/20/21      thiamine (VITAMIN B1) 100 MG tablet Take 1 tablet (100 mg total) by mouth daily. 08/24/21     vitamin B-12 (CYANOCOBALAMIN) 1000 MCG tablet Take 1,000 mcg by mouth daily with breakfast.    [provider]  XARELTO 2.5 MG TABS tablet Take 1 tablet (2.5 mg total) by mouth 2 (two) times daily. 06/25/22         Allergies    Bactrim [sulfamethoxazole-trimethoprim], Lisinopril, Losartan, and Pletal [cilostazol]    Review of Systems   Review of Systems  Constitutional:  Negative for chills and fever.  Respiratory:  Positive for cough and shortness of breath.   Cardiovascular:  Negative for chest pain.  Gastrointestinal:  Negative for abdominal pain, nausea and vomiting.  Neurological:  Negative for headaches.    Physical Exam Updated Vital Signs BP (!) 131/51   Pulse 66   Temp 99.3 F (37.4 C) (Oral)   Resp 18   Ht 5\' 3"  (1.6 m)   Wt 66 kg   SpO2 93%   BMI 25.77 kg/m  Physical Exam Vitals and nursing note reviewed.  Constitutional:      General: He is in acute distress.     Appearance: He is ill-appearing.  HENT:     Head: Normocephalic and atraumatic.     Nose: No congestion.     Mouth/Throat:     Mouth: Mucous membranes are dry.     Pharynx: Oropharynx is clear.  Eyes:     Conjunctiva/sclera: Conjunctivae normal.  Cardiovascular:     Rate and Rhythm: Normal rate and regular rhythm.     Pulses: Normal pulses.     Heart sounds: No murmur heard.    No friction rub. No gallop.  Pulmonary:     Effort: No respiratory distress.     Breath sounds: Rhonchi present. No wheezing or rales.     Comments: No evidence of acute respiratory distress nontachypneic nonhypoxic no assessor muscle usage, he has coarse sounding lungs, with notable rhonchi in the upper and lower lobes of the right lung, no wheezing rales or stridor present. Abdominal:     Palpations: Abdomen is soft.     Tenderness: There is no abdominal tenderness. There is no right CVA tenderness or left CVA  tenderness.  Musculoskeletal:     Right lower leg: Edema present.     Left lower leg: Edema present.     Comments: Patient is noted 1+ edema up to the legs bilaterally nonpitting no overlying skin changes present.  Skin:    General: Skin is warm and dry.  Neurological:     Mental Status: He is alert.  Psychiatric:        Mood and Affect: Mood normal.  ED Results / Procedures / Treatments   Labs (all labs ordered are listed, but only abnormal results are displayed) Labs Reviewed  CBC WITH DIFFERENTIAL/PLATELET - Abnormal; Notable for the following components:      Result Value   WBC 2.2 (*)    Hemoglobin 12.3 (*)    HCT 38.7 (*)    Platelets 125 (*)    Lymphs Abs 0.2 (*)    Monocytes Absolute 0.0 (*)    All other components within normal limits  COMPREHENSIVE METABOLIC PANEL - Abnormal; Notable for the following components:   Glucose, Bld 139 (*)    Albumin 3.3 (*)    AST 96 (*)    ALT 49 (*)    Alkaline Phosphatase 226 (*)    Total Bilirubin 1.3 (*)    All other components within normal limits  PROTIME-INR - Abnormal; Notable for the following components:   Prothrombin Time 16.9 (*)    INR 1.4 (*)    All other components within normal limits  BRAIN NATRIURETIC PEPTIDE - Abnormal; Notable for the following components:   B Natriuretic Peptide 333.2 (*)    All other components within normal limits  URINALYSIS, W/ REFLEX TO CULTURE (INFECTION SUSPECTED) - Abnormal; Notable for the following components:   Color, Urine AMBER (*)    APPearance CLOUDY (*)    Hgb urine dipstick MODERATE (*)    Protein, ur 100 (*)    Leukocytes,Ua MODERATE (*)    Bacteria, UA MANY (*)    All other components within normal limits  LACTIC ACID, PLASMA - Abnormal; Notable for the following components:   Lactic Acid, Venous 2.2 (*)    All other components within normal limits  I-STAT CG4 LACTIC ACID, ED - Abnormal; Notable for the following components:   Lactic Acid, Venous 2.1 (*)    All  other components within normal limits  I-STAT VENOUS BLOOD GAS, ED - Abnormal; Notable for the following components:   pCO2, Ven 42.7 (*)    pO2, Ven 30 (*)    All other components within normal limits  RESP PANEL BY RT-PCR (RSV, FLU A&B, COVID)  RVPGX2  CULTURE, BLOOD (ROUTINE X 2)  CULTURE, BLOOD (ROUTINE X 2)  URINE CULTURE  APTT  LIPASE, BLOOD  I-STAT CG4 LACTIC ACID, ED  TROPONIN I (HIGH SENSITIVITY)  TROPONIN I (HIGH SENSITIVITY)    EKG None  Radiology US Abdomen Limited  Result Date: 10/05/2022 CLINICAL DATA:  Suspected wall thickening of the gallbladder on CT. Right upper quadrant pain. EXAM: ULTRASOUND ABDOMEN LIMITED RIGHT UPPER QUADRANT COMPARISON:  Contemporaneous CT. Ultrasound complete abdomen 05/17/2017. FINDINGS: Gallbladder: There are no shadowing formed stones but there is a small volume of intraluminal layering echogenic sludge. There is no positive sonographic Murphy's sign but the free wall is slightly thickened measuring 4.2 mm, with no pericholecystic fluid. Common bile duct: Diameter: 4.6 mm with no intrahepatic biliary dilatation. Liver: No focal lesion identified. The parenchyma is mildly hyperechoic consistent with steatosis. Portal vein is patent on color Doppler imaging with normal direction of blood flow towards the liver. Other: None. IMPRESSION: 1. Mild gallbladder wall thickening with intraluminal sludge but no formed stones, pericholecystic fluid, or positive sonographic Murphy's sign. 2. No biliary dilatation. 3. Mild hepatic steatosis. Electronically Signed   By: Almira Bar M.D.   On: 10/05/2022 05:08   CT Angio Chest PE W and/or Wo Contrast  Result Date: 10/05/2022 CLINICAL DATA:  Chest pain and suspected pulmonary embolism, abdominal pain and suspected  bowel obstruction. EXAM: CT ANGIOGRAPHY CHEST CT ABDOMEN AND PELVIS WITH CONTRAST TECHNIQUE: Multidetector CT imaging of the chest was performed using the standard protocol during bolus administration  of intravenous contrast. Multiplanar CT image reconstructions and MIPs were obtained to evaluate the vascular anatomy. Multidetector CT imaging of the abdomen and pelvis was performed using the standard protocol during bolus administration of intravenous contrast. RADIATION DOSE REDUCTION: This exam was performed according to the departmental dose-optimization program which includes automated exposure control, adjustment of the mA and/or kV according to patient size and/or use of iterative reconstruction technique. CONTRAST:  75mL OMNIPAQUE IOHEXOL 350 MG/ML SOLN COMPARISON:  Low-dose lung cancer screening chest CTs dated 06/27/2021 and 12/28/2019, CTA aortogram and iliofemoral runoff 02/21/2022, and CTA aortogram and iliofemoral runoff 09/25/2019. Only the images are available from these studies. Reports are not in PACS at this time. FINDINGS: CTA CHEST FINDINGS Cardiovascular: The pulmonary trunk upper normal in caliber at 2.7 cm. No arterial embolism is seen through the segmental level. The subsegmental arteries are largely obscured on this study due to respiratory motion. The pulmonary veins are nondistended. Mild panchamber cardiomegaly is similar to the prior studies with heavy native CAD and old CABG changes. There are moderate calcific plaques in the great vessels, without significant focal stenosis, and moderate to heavy calcification in the thoracic aorta without aneurysm, dissection or stenosis. Mediastinum/Nodes: No thyroid or axillary mass. There are no enlarged intrathoracic lymph nodes. There is mild layering fluid in the trachea consistent with retained secretions or aspirate. Both main bronchi appear clear. Thoracic esophagus is unremarkable. Lungs/Pleura: There are mild features of centrilobular emphysema. Trace left pleural effusion from prior studies without pneumothorax. There is no right pleural effusion. There is linear scarring anteriorly in both upper lobes. Diffuse bronchial thickening  chronically noted but today is greater than previously. There are scattered small bronchiolar subsegmental branch impactions in the lower lobes but no consolidation or other acute pneumonic process. No evidence of pulmonary nodules. 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 No acute or other significant osseous findings or destructive lesions. Additional healed fractures elsewhere in the left rib cage. There is moderate bilateral gynecomastia on the left-greater-than-right. The visualized chest wall is otherwise unremarkable. CT ABDOMEN and PELVIS FINDINGS Hepatobiliary: There are occasional calcified hepatic granulomas. The liver is slightly steatotic, without mass enhancement. Gallbladder is mildly distended. No calcified gallstones seen but some images especially the reconstructed images suggest mild wall thickening. Consider ultrasound follow-up.  No biliary dilatation. Pancreas: The pancreas is somewhat short but this is probably on the basis of normal variant development. No mass or ductal dilatation are seen. Spleen: No abnormality. Adrenals/Urinary Tract: Areas of chronic cortical volume loss left kidney. No adrenal mass. There are a few too small to characterize hypodensities of the left kidney consistent with Bosniak 2 cysts. No follow-up imaging is recommended. There is no renal mass enhancement. There is no urinary stone or obstruction. Small extrarenal pelves are again noted. The bladder is catheterized and contracted but even so does appear somewhat thickened. This was the case on the last CT as well. Correlate with urinalysis findings. Stomach/Bowel: The stomach, as before demonstrates moderate fold thickening but no more than previously. The unopacified small bowel and appendix are unremarkable. There are opacities in the cecum most likely undigested medication. There is either wall thickening or  changes of underdistention in the distal ascending and transverse colon. Rest of the colon  wall is unremarkable. Vascular/Lymphatic: Extensive aortic and branch vessel atherosclerosis. There is a stent in the left external iliac artery. Likely calcific occlusion in both internal iliac arteries and both common iliac arteries. Proximal outflow arteries are heavily calcified and there is a partially visible right femoral artery bypass graft. Ulcerative plaque of the right anterolateral infrarenal aortic segment is unchanged grossly since 02/21/2022 and appears to be penetrating ulceration on 5:44, and this was not seen in 2019. No abdominal, pelvic or inguinal adenopathy is seen. Reproductive: No prostatomegaly. Both testicles are in the scrotal sac. Other: No free hemorrhage, free fluid, free air or incarcerated hernia. Small umbilical fat hernia. Small inguinal fat hernias. Musculoskeletal: Lumbar degenerative disc change and spondylosis L2-3 down, slight dextrorotary lumbar scoliosis. No acute or significant osseous findings. Severe acquired spinal stenosis L4-5. Review of the MIP images confirms the above findings. IMPRESSION: 1. No evidence of arterial embolus through the segmental arteries. Subsegmental arteries are largely obscured by respiratory motion. 2. Cardiomegaly without evidence of CHF. 3. 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 4. Layering fluid in the trachea consistent with retained secretions or aspirate. 5. COPD with bronchitis and scattered small bronchiolar subsegmental branch impactions in the lower lobes. No focal pneumonia. 6. Trace left pleural effusion new from prior studies. 7. Mildly distended gallbladder with some images suggesting mild wall thickening. Ultrasound follow-up recommended. No calcified stones are seen. 8. Possible wall thickening versus underdistention in the distal ascending and transverse colon. Colitis not excluded. 9. Chronic gastric fold thickening. 10.  Cystitis versus bladder nondistention with catheterized bladder. Correlate with urinalysis findings. 11. 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 12. Ulcerative plaque of the right anterolateral infrarenal aortic segment, unchanged since 02/21/2022 but not seen in 2019. No adjacent stranding or evidence of gross leakage. 13. Umbilical and inguinal fat hernias. 14. Severe acquired spinal stenosis L4-5. 15. 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 16. Emphysema. Aortic Atherosclerosis (ICD10-I70.0) and Emphysema (ICD10-J43.9). Electronically Signed   By: Almira Bar M.D.   On: 10/05/2022 03:18   CT ABDOMEN PELVIS W CONTRAST  Result Date: 10/05/2022 CLINICAL DATA:  Chest pain and suspected pulmonary embolism, abdominal pain and suspected bowel obstruction. EXAM: CT ANGIOGRAPHY CHEST CT ABDOMEN AND PELVIS WITH CONTRAST TECHNIQUE: Multidetector CT imaging of the chest was performed using the standard protocol during bolus administration of intravenous contrast. Multiplanar CT image reconstructions and MIPs were obtained to evaluate the vascular anatomy. Multidetector CT imaging of the abdomen and pelvis was performed using the standard protocol during bolus administration of intravenous contrast. RADIATION DOSE REDUCTION: This exam was performed according to the departmental dose-optimization program which includes automated exposure control, adjustment of the mA and/or kV according to patient size and/or use of iterative reconstruction technique. CONTRAST:  75mL OMNIPAQUE IOHEXOL 350 MG/ML SOLN COMPARISON:  Low-dose lung cancer screening chest CTs dated 06/27/2021 and 12/28/2019, CTA aortogram and iliofemoral runoff 02/21/2022, and CTA aortogram and iliofemoral runoff 09/25/2019. Only the images are available from these studies.  Reports are not in PACS at this time. FINDINGS: CTA CHEST FINDINGS Cardiovascular: The pulmonary trunk upper normal in caliber at 2.7 cm. No arterial embolism is seen through the segmental level. The subsegmental arteries are largely obscured on this study due to respiratory motion. The pulmonary veins are nondistended. Mild panchamber cardiomegaly is similar to the prior studies with heavy native CAD  and old CABG changes. There are moderate calcific plaques in the great vessels, without significant focal stenosis, and moderate to heavy calcification in the thoracic aorta without aneurysm, dissection or stenosis. Mediastinum/Nodes: No thyroid or axillary mass. There are no enlarged intrathoracic lymph nodes. There is mild layering fluid in the trachea consistent with retained secretions or aspirate. Both main bronchi appear clear. Thoracic esophagus is unremarkable. Lungs/Pleura: There are mild features of centrilobular emphysema. Trace left pleural effusion from prior studies without pneumothorax. There is no right pleural effusion. There is linear scarring anteriorly in both upper lobes. Diffuse bronchial thickening chronically noted but today is greater than previously. There are scattered small bronchiolar subsegmental branch impactions in the lower lobes but no consolidation or other acute pneumonic process. No evidence of pulmonary nodules. 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 No acute or other significant osseous findings or destructive lesions. Additional healed fractures elsewhere in the left rib cage. There is moderate bilateral gynecomastia on the left-greater-than-right. The visualized chest wall is otherwise unremarkable. CT ABDOMEN and PELVIS FINDINGS Hepatobiliary: There are occasional calcified hepatic granulomas. The liver is slightly steatotic, without mass enhancement. Gallbladder is mildly  distended. No calcified gallstones seen but some images especially the reconstructed images suggest mild wall thickening. Consider ultrasound follow-up.  No biliary dilatation. Pancreas: The pancreas is somewhat short but this is probably on the basis of normal variant development. No mass or ductal dilatation are seen. Spleen: No abnormality. Adrenals/Urinary Tract: Areas of chronic cortical volume loss left kidney. No adrenal mass. There are a few too small to characterize hypodensities of the left kidney consistent with Bosniak 2 cysts. No follow-up imaging is recommended. There is no renal mass enhancement. There is no urinary stone or obstruction. Small extrarenal pelves are again noted. The bladder is catheterized and contracted but even so does appear somewhat thickened. This was the case on the last CT as well. Correlate with urinalysis findings. Stomach/Bowel: The stomach, as before demonstrates moderate fold thickening but no more than previously. The unopacified small bowel and appendix are unremarkable. There are opacities in the cecum most likely undigested medication. There is either wall thickening or changes of underdistention in the distal ascending and transverse colon. Rest of the colon wall is unremarkable. Vascular/Lymphatic: Extensive aortic and branch vessel atherosclerosis. There is a stent in the left external iliac artery. Likely calcific occlusion in both internal iliac arteries and both common iliac arteries. Proximal outflow arteries are heavily calcified and there is a partially visible right femoral artery bypass graft. Ulcerative plaque of the right anterolateral infrarenal aortic segment is unchanged grossly since 02/21/2022 and appears to be penetrating ulceration on 5:44, and this was not seen in 2019. No abdominal, pelvic or inguinal adenopathy is seen. Reproductive: No prostatomegaly. Both testicles are in the scrotal sac. Other: No free hemorrhage, free fluid, free air or  incarcerated hernia. Small umbilical fat hernia. Small inguinal fat hernias. Musculoskeletal: Lumbar degenerative disc change and spondylosis L2-3 down, slight dextrorotary lumbar scoliosis. No acute or significant osseous findings. Severe acquired spinal stenosis L4-5. Review of the MIP images confirms the above findings. IMPRESSION: 1. No evidence of arterial embolus through the segmental arteries. Subsegmental arteries are largely obscured by respiratory motion. 2. Cardiomegaly without evidence of CHF. 3. 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 4. Layering fluid in the trachea consistent with retained secretions or aspirate. 5. COPD with bronchitis and scattered small bronchiolar  subsegmental branch impactions in the lower lobes. No focal pneumonia. 6. Trace left pleural effusion new from prior studies. 7. Mildly distended gallbladder with some images suggesting mild wall thickening. Ultrasound follow-up recommended. No calcified stones are seen. 8. Possible wall thickening versus underdistention in the distal ascending and transverse colon. Colitis not excluded. 9. Chronic gastric fold thickening. 10. Cystitis versus bladder nondistention with catheterized bladder. Correlate with urinalysis findings. 11. 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 12. Ulcerative plaque of the right anterolateral infrarenal aortic segment, unchanged since 02/21/2022 but not seen in 2019. No adjacent stranding or evidence of gross leakage. 13. Umbilical and inguinal fat hernias. 14. Severe acquired spinal stenosis L4-5. 15. 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 16. Emphysema. Aortic Atherosclerosis (ICD10-I70.0) and Emphysema (ICD10-J43.9). Electronically Signed   By: Almira Bar M.D.   On: 10/05/2022 03:18   DG Chest Port 1  View  Result Date: 10/04/2022 CLINICAL DATA:  Questionable sepsis EXAM: PORTABLE CHEST 1 VIEW COMPARISON:  Chest x-ray 01/12/2022 FINDINGS: Sternotomy wires are present. The heart size and mediastinal contours are within normal limits. Both lungs are clear. No acute osseous findings. IMPRESSION: No active disease. Electronically Signed   By: Darliss Cheney M.D.   On: 10/04/2022 23:59    Procedures .Critical Care  Performed by: Carroll Sage, PA-C Authorized by: Carroll Sage, PA-C   Critical care provider statement:    Critical care time (minutes):  30   Critical care was necessary to treat or prevent imminent or life-threatening deterioration of the following conditions:  Sepsis   Critical care was time spent personally by me on the following activities:  Development of treatment plan with patient or surrogate, discussions with consultants, evaluation of patient's response to treatment, examination of patient, ordering and review of laboratory studies, ordering and review of radiographic studies, ordering and performing treatments and interventions, pulse oximetry, re-evaluation of patient's condition and review of old charts   I assumed direction of critical care for this patient from another provider in my specialty: no     Care discussed with: admitting provider       Medications Ordered in ED Medications  acetaminophen (TYLENOL) tablet 650 mg (650 mg Oral Given 10/05/22 0027)  lactated ringers infusion (has no administration in time range)  sodium chloride 0.9 % bolus 500 mL (0 mLs Intravenous Stopped 10/05/22 0110)  ondansetron (ZOFRAN) injection 4 mg (4 mg Intravenous Given 10/05/22 0027)  ceFEPIme (MAXIPIME) 2 g in sodium chloride 0.9 % 100 mL IVPB (0 g Intravenous Stopped 10/05/22 0126)  metroNIDAZOLE (FLAGYL) IVPB 500 mg (0 mg Intravenous Stopped 10/05/22 0303)  vancomycin (VANCOREADY) IVPB 1250 mg/250 mL (0 mg Intravenous Stopped 10/05/22 0436)  lactated ringers bolus 1,000  mL (0 mLs Intravenous Stopped 10/05/22 0621)  iohexol (OMNIPAQUE) 350 MG/ML injection 75 mL (75 mLs Intravenous Contrast Given 10/05/22 0206)    ED Course/ Medical Decision Making/ A&P                                 Medical Decision Making Amount and/or Complexity of Data Reviewed Labs: ordered. Radiology: ordered. ECG/medicine tests: ordered.  Risk OTC drugs. Prescription drug management. Decision regarding hospitalization.   This patient presents to the ED for concern of shortness of breath, this involves an extensive number of  treatment options, and is a complaint that carries with it a high risk of complications and morbidity.  The differential diagnosis includes PE, ACS, dissection, pneumonia, CHF,    Additional history obtained:  Additional history obtained from daughter External records from outside source obtained and reviewed including cardiology notes   Co morbidities that complicate the patient evaluation  On anticoag's, PVD, COPD,  Social Determinants of Health:  Geriatric    Lab Tests:  I Ordered, and personally interpreted labs.  The pertinent results include: CBC reveals leukocytopenia with a white count 2.2, normocytic anemia hemoglobin 12.3, thrombocytopenia with a platelet count of 125, CMP reveals glucose of 139, AST 96, ALT 49, alk phos 226, total T. bili 1.3, APTT 26, prothrombin time elevated at 16.9 INR 1.4, lipase 26, first troponin is 7, UA shows moderate leukocytes red blood cells white blood cells many bacteria, BNP is 332, initial lactic is 2.2, VBG reveals pCO2 of 42 pCO2 of 30,   Imaging Studies ordered:  I ordered imaging studies including chest x-ray, CT of chest, CT ab pelvis I independently visualized and interpreted imaging which showed chest x-ray unremarkable, CT imaging was negative for PE, does show cardiomegaly without CHF, concerns for possible aspiration within the trachea, mild gallbladder distention, possible cystitis.  I  agree with the radiologist interpretation   Cardiac Monitoring:  The patient was maintained on a cardiac monitor.  I personally viewed and interpreted the cardiac monitored which showed an underlying rhythm of: Without signs of ischemia   Medicines ordered and prescription drug management:  I ordered medication including antipyretics, fluids I have reviewed the patients home medicines and have made adjustments as needed  Critical Interventions:  Patient meets sepsis criteria, febrile, tachypneic, leukocytopenia with lactic of 2.2, unclear etiology, will start on broad-spectrum antibiotics. Patient was reassessed resting comfortably vital signs remained stable, patient has a downtrending lactic.   Reevaluation:  Presents febrile, borderline tachypneic, concern for possible pneumonia versus CHF versus PE, will obtain screening lab workup, continue to monitor.  Chest x-ray is unremarkable, patient has notable rhonchi on my exam, endorses shortness of breath, I am concerned for possible PE, will send for CT of chest, will also add on CT abdomen pelvis as he had abrupt episode of vomiting on my exam, abdomen is soft nontender, unclear source of possible infection, will continue to monitor.  CT scan distended gallbladder, he has no upper quadrant tenderness, will further assess with limited ultrasound  Ultrasound is negative for cholecystitis, will admit to medicine for sepsis likely secondary due to UTI.  Consultations Obtained:  I requested consultation with the Dr. Joneen Roach,  and discussed lab and imaging findings as well as pertinent plan - they recommend: She will admit the patient.    Test Considered:  N/A    Rule out I have low suspicion for ACS as history is atypical, patient has no cardiac history, EKG was sinus rhythm without signs of ischemia, patient has negative delta troponin.  Low suspicion for PE cta is negative Low suspicion for AAA or aortic dissection as history  is atypical, patient has low risk factors.  Suspicion for acute cholecystitis/cholangitis is low at this time no ductal dilation, no noted inflammation seen on ultrasound, he is also nontender my exam.  I doubt pancreatitis lipase is within normal limits.  Suspicion for bowel obstruction volvulus diverticulitis abdominal abscess or mass low at this time CT imaging is all negative these findings.    Dispostion and problem list  After consideration  of the diagnostic results and the patients response to treatment, I feel that the patent would benefit from admission.  Sepsis-suspect this is likely coming from a UTI, but is possibly could have aspirated pneumonia, currently on broad-spectrum antibiotics, patiently continue monitoring, likely de-escalation. Distended gallbladder-has no abdominal tenderness, patient likely to be evaluated as outpatient.             Final Clinical Impression(s) / ED Diagnoses Final diagnoses:  Aspiration pneumonia, unspecified aspiration pneumonia type, unspecified laterality, unspecified part of lung (HCC)  Acute cystitis with hematuria  Sepsis without acute organ dysfunction, due to unspecified organism [facility])    Rx / DC Orders ED Discharge Orders     None         Carroll Sage, PA-C 10/05/22 9147    Nicanor Alcon, April, MD 10/05/22 [phone]

## 2022-10-05 ENCOUNTER — Emergency Department (HOSPITAL_COMMUNITY): Payer: HMO

## 2022-10-05 ENCOUNTER — Observation Stay (HOSPITAL_COMMUNITY): Payer: HMO

## 2022-10-05 DIAGNOSIS — A4151 Sepsis due to Escherichia coli [E. coli]: Secondary | ICD-10-CM | POA: Diagnosis not present

## 2022-10-05 DIAGNOSIS — R7881 Bacteremia: Secondary | ICD-10-CM

## 2022-10-05 DIAGNOSIS — N390 Urinary tract infection, site not specified: Secondary | ICD-10-CM | POA: Insufficient documentation

## 2022-10-05 DIAGNOSIS — R7401 Elevation of levels of liver transaminase levels: Secondary | ICD-10-CM

## 2022-10-05 DIAGNOSIS — A419 Sepsis, unspecified organism: Secondary | ICD-10-CM | POA: Diagnosis present

## 2022-10-05 DIAGNOSIS — I739 Peripheral vascular disease, unspecified: Secondary | ICD-10-CM

## 2022-10-05 LAB — CBC WITH DIFFERENTIAL/PLATELET
Abs Immature Granulocytes: 0 10*3/uL (ref 0.00–0.07)
Abs Immature Granulocytes: 0.07 10*3/uL (ref 0.00–0.07)
Basophils Absolute: 0 10*3/uL (ref 0.0–0.1)
Basophils Absolute: 0.1 10*3/uL (ref 0.0–0.1)
Basophils Relative: 0 %
Basophils Relative: 0 %
Eosinophils Absolute: 0 10*3/uL (ref 0.0–0.5)
Eosinophils Absolute: 0 10*3/uL (ref 0.0–0.5)
Eosinophils Relative: 0 %
Eosinophils Relative: 0 %
HCT: 38.7 % — ABNORMAL LOW (ref 39.0–52.0)
HCT: 34.7 % — ABNORMAL LOW (ref 39.0–52.0)
Hemoglobin: 12.3 g/dL — ABNORMAL LOW (ref 13.0–17.0)
Hemoglobin: 11.4 g/dL — ABNORMAL LOW (ref 13.0–17.0)
Immature Granulocytes: 0 %
Immature Granulocytes: 1 %
Lymphocytes Relative: 7 %
Lymphocytes Relative: 6 %
Lymphs Abs: 0.2 10*3/uL — ABNORMAL LOW (ref 0.7–4.0)
Lymphs Abs: 0.8 10*3/uL (ref 0.7–4.0)
MCH: 28.1 pg (ref 26.0–34.0)
MCH: 29.2 pg (ref 26.0–34.0)
MCHC: 31.8 g/dL (ref 30.0–36.0)
MCHC: 32.9 g/dL (ref 30.0–36.0)
MCV: 88.4 fL (ref 80.0–100.0)
MCV: 89 fL (ref 80.0–100.0)
Monocytes Absolute: 0 10*3/uL — ABNORMAL LOW (ref 0.1–1.0)
Monocytes Absolute: 0.6 10*3/uL (ref 0.1–1.0)
Monocytes Relative: 1 %
Monocytes Relative: 4 %
Neutro Abs: 2.1 10*3/uL (ref 1.7–7.7)
Neutro Abs: 12 10*3/uL — ABNORMAL HIGH (ref 1.7–7.7)
Neutrophils Relative %: 92 %
Neutrophils Relative %: 89 %
Platelets: 125 10*3/uL — ABNORMAL LOW (ref 150–400)
Platelets: 112 10*3/uL — ABNORMAL LOW (ref 150–400)
RBC: 4.38 MIL/uL (ref 4.22–5.81)
RBC: 3.9 MIL/uL — ABNORMAL LOW (ref 4.22–5.81)
RDW: 13.7 % (ref 11.5–15.5)
RDW: 14.1 % (ref 11.5–15.5)
WBC: 2.2 10*3/uL — ABNORMAL LOW (ref 4.0–10.5)
WBC: 13.5 10*3/uL — ABNORMAL HIGH (ref 4.0–10.5)
nRBC: 0 % (ref 0.0–0.2)
nRBC: 0 % (ref 0.0–0.2)

## 2022-10-05 LAB — COMPREHENSIVE METABOLIC PANEL
ALT: 49 U/L — ABNORMAL HIGH (ref 0–44)
ALT: 57 U/L — ABNORMAL HIGH (ref 0–44)
AST: 96 U/L — ABNORMAL HIGH (ref 15–41)
AST: 71 U/L — ABNORMAL HIGH (ref 15–41)
Albumin: 3.3 g/dL — ABNORMAL LOW (ref 3.5–5.0)
Albumin: 2.7 g/dL — ABNORMAL LOW (ref 3.5–5.0)
Alkaline Phosphatase: 226 U/L — ABNORMAL HIGH (ref 38–126)
Alkaline Phosphatase: 212 U/L — ABNORMAL HIGH (ref 38–126)
Anion gap: 15 (ref 5–15)
Anion gap: 7 (ref 5–15)
BUN: 14 mg/dL (ref 8–23)
BUN: 9 mg/dL (ref 8–23)
CO2: 24 mmol/L (ref 22–32)
CO2: 24 mmol/L (ref 22–32)
Calcium: 9.3 mg/dL (ref 8.9–10.3)
Calcium: 8.3 mg/dL — ABNORMAL LOW (ref 8.9–10.3)
Chloride: 98 mmol/L (ref 98–111)
Chloride: 104 mmol/L (ref 98–111)
Creatinine, Ser: 0.97 mg/dL (ref 0.61–1.24)
Creatinine, Ser: 0.9 mg/dL (ref 0.61–1.24)
GFR, Estimated: >60 mL/min (ref >60)
GFR, Estimated: >60 mL/min (ref >60)
Glucose, Bld: 139 mg/dL — ABNORMAL HIGH (ref 70–99)
Glucose, Bld: 89 mg/dL (ref 70–99)
Potassium: 4.1 mmol/L (ref 3.5–5.1)
Potassium: 4.2 mmol/L (ref 3.5–5.1)
Sodium: 137 mmol/L (ref 135–145)
Sodium: 135 mmol/L (ref 135–145)
Total Bilirubin: 1.3 mg/dL — ABNORMAL HIGH (ref 0.3–1.2)
Total Bilirubin: 3.1 mg/dL — ABNORMAL HIGH (ref 0.3–1.2)
Total Protein: 7.3 g/dL (ref 6.5–8.1)
Total Protein: 6.3 g/dL — ABNORMAL LOW (ref 6.5–8.1)

## 2022-10-05 LAB — RESPIRATORY PANEL BY PCR

## 2022-10-05 LAB — URINALYSIS, W/ REFLEX TO CULTURE (INFECTION SUSPECTED)
Bilirubin Urine: NEGATIVE
Glucose, UA: NEGATIVE mg/dL
Ketones, ur: NEGATIVE mg/dL
Nitrite: NEGATIVE
Protein, ur: 100 mg/dL — AB
RBC / HPF: >50 RBC/hpf (ref 0–5)
Specific Gravity, Urine: 1.016 (ref 1.005–1.030)
WBC, UA: >50 WBC/hpf (ref 0–5)
pH: 7 (ref 5.0–8.0)

## 2022-10-05 LAB — BLOOD CULTURE ID PANEL (REFLEXED) - BCID2

## 2022-10-05 LAB — EXPECTORATED SPUTUM ASSESSMENT W GRAM STAIN, RFLX TO RESP C

## 2022-10-05 LAB — SARS CORONAVIRUS 2 BY RT PCR: SARS Coronavirus 2 by RT PCR: NEGATIVE

## 2022-10-05 LAB — RESP PANEL BY RT-PCR (RSV, FLU A&B, COVID)  RVPGX2
Influenza A by PCR: NEGATIVE
Influenza B by PCR: NEGATIVE
Resp Syncytial Virus by PCR: NEGATIVE
SARS Coronavirus 2 by RT PCR: NEGATIVE

## 2022-10-05 LAB — HEPATITIS PANEL, ACUTE
HCV Ab: NONREACTIVE
Hep A IgM: NONREACTIVE
Hep B C IgM: NONREACTIVE
Hepatitis B Surface Ag: NONREACTIVE

## 2022-10-05 LAB — I-STAT CG4 LACTIC ACID, ED
Lactic Acid, Venous: 1 mmol/L (ref 0.5–1.9)
Lactic Acid, Venous: 1.4 mmol/L (ref 0.5–1.9)

## 2022-10-05 LAB — PROTIME-INR
INR: 1.4 — ABNORMAL HIGH (ref 0.8–1.2)
Prothrombin Time: 16.9 seconds — ABNORMAL HIGH (ref 11.4–15.2)

## 2022-10-05 LAB — GAMMA GT: GGT: 446 U/L — ABNORMAL HIGH (ref 7–50)

## 2022-10-05 LAB — LIPASE, BLOOD: Lipase: 26 U/L (ref 11–51)

## 2022-10-05 LAB — BRAIN NATRIURETIC PEPTIDE: B Natriuretic Peptide: 333.2 pg/mL — ABNORMAL HIGH (ref 0.0–100.0)

## 2022-10-05 LAB — LACTIC ACID, PLASMA: Lactic Acid, Venous: 2.2 mmol/L (ref 0.5–1.9)

## 2022-10-05 LAB — TROPONIN I (HIGH SENSITIVITY)
Troponin I (High Sensitivity): 7 ng/L (ref <18)
Troponin I (High Sensitivity): 31 ng/L — ABNORMAL HIGH (ref <18)

## 2022-10-05 LAB — PROCALCITONIN: Procalcitonin: 35.26 ng/mL

## 2022-10-05 LAB — AMMONIA: Ammonia: 39 umol/L — ABNORMAL HIGH (ref 9–35)

## 2022-10-05 LAB — APTT: aPTT: 26 seconds (ref 24–36)

## 2022-10-05 MED ORDER — IPRATROPIUM-ALBUTEROL 0.5-2.5 (3) MG/3ML IN SOLN
3.0000 mL | Freq: Four times a day (QID) | RESPIRATORY_TRACT | Status: AC
  Administered 2022-10-05 – 2022-10-06 (×3): 3 mL via RESPIRATORY_TRACT
  Filled 2022-10-05 (×3): qty 3

## 2022-10-05 MED ORDER — METRONIDAZOLE 500 MG/100ML IV SOLN
500.0000 mg | Freq: Once | INTRAVENOUS | Status: AC
  Administered 2022-10-05: 500 mg via INTRAVENOUS
  Filled 2022-10-05: qty 100

## 2022-10-05 MED ORDER — ACETAMINOPHEN 325 MG PO TABS
650.0000 mg | ORAL_TABLET | Freq: Four times a day (QID) | ORAL | Status: DC | PRN
  Administered 2022-10-05 – 2022-10-07 (×3): 650 mg via ORAL
  Filled 2022-10-05 (×2): qty 2

## 2022-10-05 MED ORDER — NICOTINE 14 MG/24HR TD PT24
14.0000 mg | MEDICATED_PATCH | Freq: Every day | TRANSDERMAL | Status: DC
  Administered 2022-10-05 – 2022-10-08 (×4): 14 mg via TRANSDERMAL
  Filled 2022-10-05 (×4): qty 1

## 2022-10-05 MED ORDER — ALBUTEROL SULFATE (2.5 MG/3ML) 0.083% IN NEBU: 2.5000 mg | INHALATION_SOLUTION | RESPIRATORY_TRACT | Status: DC | PRN

## 2022-10-05 MED ORDER — PIPERACILLIN-TAZOBACTAM 3.375 G IVPB
3.3750 g | Freq: Three times a day (TID) | INTRAVENOUS | Status: DC
  Administered 2022-10-05 – 2022-10-07 (×7): 3.375 g via INTRAVENOUS
  Filled 2022-10-05 (×8): qty 50

## 2022-10-05 MED ORDER — ONDANSETRON HCL 4 MG PO TABS: 4.0000 mg | ORAL_TABLET | Freq: Four times a day (QID) | ORAL | Status: DC | PRN

## 2022-10-05 MED ORDER — ONDANSETRON HCL 4 MG/2ML IJ SOLN: 4.0000 mg | Freq: Four times a day (QID) | INTRAMUSCULAR | Status: DC | PRN

## 2022-10-05 MED ORDER — ACETAMINOPHEN 650 MG RE SUPP: 650.0000 mg | Freq: Four times a day (QID) | RECTAL | Status: DC | PRN

## 2022-10-05 MED ORDER — IOHEXOL 350 MG/ML SOLN
75.0000 mL | Freq: Once | INTRAVENOUS | Status: AC | PRN
  Administered 2022-10-05: 75 mL via INTRAVENOUS

## 2022-10-05 MED ORDER — SODIUM CHLORIDE 0.9 % IV SOLN
2.0000 g | Freq: Once | INTRAVENOUS | Status: AC
  Administered 2022-10-05: 2 g via INTRAVENOUS
  Filled 2022-10-05: qty 12.5

## 2022-10-05 MED ORDER — RIVAROXABAN 2.5 MG PO TABS
2.5000 mg | ORAL_TABLET | Freq: Two times a day (BID) | ORAL | Status: DC
  Administered 2022-10-05 – 2022-10-08 (×7): 2.5 mg via ORAL
  Filled 2022-10-05 (×8): qty 1

## 2022-10-05 MED ORDER — FLUTICASONE FUROATE-VILANTEROL 100-25 MCG/ACT IN AEPB
1.0000 | INHALATION_SPRAY | Freq: Every day | RESPIRATORY_TRACT | Status: DC
  Administered 2022-10-06 – 2022-10-08 (×3): 1 via RESPIRATORY_TRACT
  Filled 2022-10-05: qty 28

## 2022-10-05 MED ORDER — UMECLIDINIUM BROMIDE 62.5 MCG/ACT IN AEPB
1.0000 | INHALATION_SPRAY | Freq: Every day | RESPIRATORY_TRACT | Status: DC
  Administered 2022-10-06 – 2022-10-08 (×3): 1 via RESPIRATORY_TRACT
  Filled 2022-10-05: qty 7

## 2022-10-05 MED ORDER — VANCOMYCIN HCL IN DEXTROSE 1-5 GM/200ML-% IV SOLN: 1000.0000 mg | Freq: Once | INTRAVENOUS | Status: DC

## 2022-10-05 MED ORDER — LACTATED RINGERS IV SOLN: INTRAVENOUS | Status: DC

## 2022-10-05 MED ORDER — OLOPATADINE HCL 0.1 % OP SOLN: 1.0000 [drp] | Freq: Every day | OPHTHALMIC | Status: DC | PRN

## 2022-10-05 MED ORDER — LACTATED RINGERS IV BOLUS
1000.0000 mL | Freq: Once | INTRAVENOUS | Status: AC
  Administered 2022-10-05: 1000 mL via INTRAVENOUS

## 2022-10-05 MED ORDER — VANCOMYCIN HCL 1250 MG/250ML IV SOLN
1250.0000 mg | Freq: Once | INTRAVENOUS | Status: AC
  Administered 2022-10-05: 1250 mg via INTRAVENOUS
  Filled 2022-10-05: qty 250

## 2022-10-05 NOTE — Assessment & Plan Note (Addendum)
S/p CABG  Troponin wnl Continue medical management with lipitor, coreg and ASA

## 2022-10-05 NOTE — Sepsis Progress Note (Signed)
Elink monitoring for the code sepsis protocol.  

## 2022-10-05 NOTE — Consult Note (Signed)
[facility]    Date of Admission:  10/04/2022     Reason for Consult: bacteremia    Referring Provider: Orland Mustard      Abx: 9/19-c piptazo        Assessment: 72 yo male with chronic foley and recently changed 2 days prior to admission at home, admitted 9/19 for 1 day acute onset malaise, shaking chill without localizing sx  He was found to have: Ecoli bacteremia Gpr bacteremia   Source unclear but has foley catheter (last 3 months; changed 1 day prior to admission) and could have gone into blood from there. Other consideration gi source which is likely as well given the GPR could be clostridium species   He does have abd pelv chest ct and u/s abd --> mild colonic wall thickening (no sx in terms of abd pain/diarrhea); thickened gallbladder wall but again no sx or sonographic murphy sign  Lft up and thrombocytopenia likely sepsis related   Either way clinically much improved    Plan: Continue piptazo pending gpr identification F/u final cx report If gpr confirmed gi bug would benefit from outpatient evaluation for colonoscopy if he so desires to r/o cancer Anticipate 7 day abx treatment Given quick resolution sepsis and negative abd pelv chest ct in terms of deep seated infection I do not feel need to repeat bcx Discussed with primary team      ------------------------------------------------ Principal Problem:   Sepsis with bacteremia Active Problems:   COPD (chronic obstructive pulmonary disease) (HCC)   Tobacco abuse   Essential hypertension   Hypercholesteremia   Iron deficiency anemia   CAD (coronary artery disease)   Chronic osteomyelitis (HCC)   Transaminitis   UTI (urinary tract infection)   PAD (peripheral artery disease) (HCC)    HPI: Caleb [name] is a 72 y.o. male cad s/p cabg 2016, pvd, hx femoroppliteal bypass and bilateral LE strents for nonhealing  heel ulcers,  chronic foley and recently changed 2  days prior to admission at home, admitted 9/19 for 1 day acute onset dyspnea, malaise, without localizing sx otherwise  Patient was well until the day of admission. Just prior to that he had routine foley change  He had acute onset dyspnea without subjective f/c  However, at presentation Febrile to 102s Leukpenia Mild lft elevation and mild thrombocytopenia Ct scan and abd u/s obtained for lab abnormality as below. Colonic thickening, gallbladder wall thickening but no exam correlation  Bcx returned with gpr and ecoli Started on piptazo  Feeling lots better    Family History  Problem Relation Age of Onset   Heart attack Mother    Diabetes Mother    Heart attack Brother    Diabetes Brother    Cancer Sister    Stroke Neg Hx     Social History   Tobacco Use   Smoking status: Every Day    Current packs/day: 0.00    Average packs/day: 0.5 packs/day for 35.0 years (17.5 ttl pk-yrs)    Types: Cigarettes    Start date: 02/12/1979    Last attempt to quit: 02/11/2014    Years since quitting: 8.6   Smokeless tobacco: Never   Tobacco comments:    has quit twice before   Vaping Use   Vaping status: Never Used  Substance Use Topics   Alcohol use: Yes    Alcohol/week: 0.0 standard drinks of alcohol    Comment: Six pack a day of  beer   Drug use: Never    Allergies  Allergen Reactions   Sulfamethoxazole-Trimethoprim Other (See Comments)    "Made potassium go high and sodium go low"   Lisinopril Cough and Other (See Comments)   Losartan Swelling    Pt's daughter stated it made pt's blood pressure and sodium too low, but pt has been tolerating irbesartan.   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 rate 17, height 5\' 3"  (1.6 m), weight 66 kg, SpO2 95%.  Physical Exam  General/constitutional: no distress, pleasant HEENT: Normocephalic, PER, Conj Clear, EOMI, Oropharynx clear Neck supple CV: rrr no mrg Lungs: clear to auscultation, normal respiratory effort Abd: Soft, Nontender Ext: no edema Skin: No Rash Neuro: nonfocal MSK: no peripheral joint swelling/tenderness/warmth; back spines nontender  Gu; foley present clear yellow urine   Lab Results Lab Results  Component Value Date   WBC 2.2 (L) 10/04/2022   HGB 13.3 10/04/2022   HCT 39.0 10/04/2022   MCV 88.4 10/04/2022   PLT 125 (L) 10/04/2022    Lab Results  Component Value Date   CREATININE 0.97 10/04/2022   BUN 14 10/04/2022   NA 135 10/04/2022   K 4.1 10/04/2022   CL 98 10/04/2022   CO2 24 10/04/2022    Lab Results  Component Value Date   ALT 49 (H) 10/04/2022   AST 96 (H) 10/04/2022   ALKPHOS 226 (H) 10/04/2022   BILITOT 1.3 (H) 10/04/2022      Microbiology: Recent Results (from the past 240 hour(s))  Blood Culture (routine x 2)     Status: None (Preliminary result)   Collection Time: 10/04/22 11:19 PM   Specimen: BLOOD LEFT ARM  Result Value Ref Range Status   Specimen Description BLOOD LEFT  ARM  Final   Special Requests   Final    BOTTLES DRAWN AEROBIC AND ANAEROBIC Blood Culture results may not be optimal due to an excessive volume of blood received in culture bottles   Culture  Setup Time   Final    GRAM POSITIVE RODS ANAEROBIC BOTTLE ONLY CRITICAL RESULT CALLED TO, READ BACK BY AND VERIFIED WITH: PHARMD UTOMWEN, A. [id number] FCP GRAM NEGATIVE RODS IN BOTH AEROBIC AND ANAEROBIC BOTTLES Performed at [facility], [address] [place], [place] [address]    Culture Romie Minus NEGATIVE RODS GRAM POSITIVE RODS   Final   Report Status PENDING  Incomplete  Resp panel by RT-PCR (RSV, Flu  A&B, Covid) Urine, Catheterized     Status: None   Collection Time: 10/04/22 11:19 PM   Specimen: Urine, Catheterized; Nasal Swab  Result Value Ref Range Status   SARS Coronavirus 2 by RT PCR NEGATIVE NEGATIVE Final   Influenza A by PCR NEGATIVE NEGATIVE Final   Influenza B by PCR NEGATIVE NEGATIVE Final    Comment: (NOTE) The Xpert Xpress SARS-CoV-2/FLU/RSV plus assay is intended as an aid in the diagnosis of influenza from Nasopharyngeal swab specimens and should not be used as a sole basis for treatment. Nasal washings and aspirates are unacceptable for Xpert Xpress SARS-CoV-2/FLU/RSV testing.  Fact Sheet for Patients: BloggerCourse.com  Fact Sheet for Healthcare Providers: SeriousBroker.it  This test is not yet approved or cleared by the [place] FDA and has been authorized for detection and/or diagnosis of SARS-CoV-2 by FDA under an Emergency Use Authorization (EUA). This EUA will remain in effect (meaning this test can be used) for the duration of the COVID-19 declaration under Section 564(b)(1) of the Act, 21 U.S.C. section 360bbb-3(b)(1), unless the authorization is terminated or revoked.     Resp Syncytial Virus by PCR NEGATIVE NEGATIVE Final    Comment: (NOTE) Fact Sheet for  Patients: BloggerCourse.com  Fact Sheet for Healthcare Providers: SeriousBroker.it  This test is not yet approved or cleared by the [place] FDA and has been authorized for detection and/or diagnosis of SARS-CoV-2 by FDA under an Emergency Use Authorization (EUA). This EUA will remain in effect (meaning this test can be used) for the duration of the COVID-19 declaration under Section 564(b)(1) of the Act, 21 U.S.C. section 360bbb-3(b)(1), unless the authorization is terminated or revoked.  Performed at [facility], [address] [place], [place] [address]   Blood Culture ID Panel (Reflexed)     Status: Abnormal   Collection Time: 10/04/22 11:19 PM  Result Value Ref Range Status   Enterococcus faecalis NOT DETECTED NOT DETECTED Final   Enterococcus Faecium NOT DETECTED NOT DETECTED Final   Listeria monocytogenes NOT DETECTED NOT DETECTED Final   Staphylococcus species NOT DETECTED NOT DETECTED Final   Staphylococcus aureus (BCID) NOT DETECTED NOT DETECTED Final   Staphylococcus epidermidis NOT DETECTED NOT DETECTED Final   Staphylococcus lugdunensis NOT DETECTED NOT DETECTED Final   Streptococcus species NOT DETECTED NOT DETECTED Final   Streptococcus agalactiae NOT DETECTED NOT DETECTED Final   Streptococcus pneumoniae NOT DETECTED NOT DETECTED Final   Streptococcus pyogenes NOT DETECTED NOT DETECTED Final   A.calcoaceticus-baumannii NOT DETECTED NOT DETECTED Final   Bacteroides fragilis NOT DETECTED NOT DETECTED Final   Enterobacterales DETECTED (A) NOT DETECTED Final    Comment: Enterobacterales represent a large order of gram negative bacteria, not a single organism. CRITICAL RESULT CALLED TO, READ BACK BY AND VERIFIED WITH: PHARMD UTOMWEN, A. 1102 [id number] FCP    Enterobacter cloacae complex NOT DETECTED NOT DETECTED Final   Escherichia coli DETECTED (A) NOT DETECTED Final    Comment: CRITICAL RESULT CALLED TO,  READ BACK BY AND VERIFIED WITH: PHARMD UTOMWEN, A. 1102 [id number] FCP    Klebsiella aerogenes NOT DETECTED NOT DETECTED Final   Klebsiella oxytoca NOT DETECTED NOT DETECTED Final   Klebsiella pneumoniae NOT DETECTED NOT DETECTED Final   Proteus species NOT DETECTED NOT DETECTED Final   Salmonella species NOT DETECTED NOT DETECTED Final   Serratia marcescens NOT DETECTED NOT DETECTED Final   Haemophilus influenzae NOT DETECTED NOT DETECTED Final   Neisseria meningitidis NOT DETECTED NOT DETECTED Final   Pseudomonas  aeruginosa NOT DETECTED NOT DETECTED Final   Stenotrophomonas maltophilia NOT DETECTED NOT DETECTED Final   Candida albicans NOT DETECTED NOT DETECTED Final   Candida auris NOT DETECTED NOT DETECTED Final   Candida glabrata NOT DETECTED NOT DETECTED Final   Candida krusei NOT DETECTED NOT DETECTED Final   Candida parapsilosis NOT DETECTED NOT DETECTED Final   Candida tropicalis NOT DETECTED NOT DETECTED Final   Cryptococcus neoformans/gattii NOT DETECTED NOT DETECTED Final   CTX-M ESBL NOT DETECTED NOT DETECTED Final   Carbapenem resistance IMP NOT DETECTED NOT DETECTED Final   Carbapenem resistance KPC NOT DETECTED NOT DETECTED Final   Carbapenem resistance NDM NOT DETECTED NOT DETECTED Final   Carbapenem resist OXA 48 LIKE NOT DETECTED NOT DETECTED Final   Carbapenem resistance VIM NOT DETECTED NOT DETECTED Final    Comment: Performed at [facility], [address], [place], [place] [address]  Blood Culture (routine x 2)     Status: None (Preliminary result)   Collection Time: 10/05/22 12:28 AM   Specimen: BLOOD RIGHT HAND  Result Value Ref Range Status   Specimen Description BLOOD RIGHT HAND  Final   Special Requests   Final    BOTTLES DRAWN AEROBIC AND ANAEROBIC Blood Culture adequate volume   Culture  Setup Time   Final    GRAM NEGATIVE RODS GRAM POSITIVE RODS ANAEROBIC BOTTLE ONLY CRITICAL VALUE NOTED.  VALUE IS CONSISTENT WITH PREVIOUSLY REPORTED AND CALLED  VALUE. Performed at [facility], [address], [place], [place] [address]    Culture Romie Minus NEGATIVE RODS GRAM POSITIVE RODS   Final   Report Status PENDING  Incomplete     Serology:    Imaging: If present, new imagings (plain films, ct scans, and mri) have been personally visualized and interpreted; radiology reports have been reviewed. Decision making incorporated into the Impression / Recommendations.  9/20 abd u/s 1. Mild gallbladder wall thickening with intraluminal sludge but no formed stones, pericholecystic fluid, or positive sonographic Murphy's sign. 2. No biliary dilatation. 3. Mild hepatic steatosis.   9/20 cta abd pelv chest 1. No evidence of arterial embolus through the segmental arteries. Subsegmental arteries are largely obscured by respiratory motion. 2. Cardiomegaly without evidence of CHF. 3. 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 4. Layering fluid in the trachea consistent with retained secretions or aspirate. 5. COPD with bronchitis and scattered small bronchiolar subsegmental branch impactions in the lower lobes. No focal pneumonia. 6. Trace left pleural effusion new from prior studies. 7. Mildly distended gallbladder with some images suggesting mild wall thickening. Ultrasound follow-up recommended. No calcified stones are seen. 8. Possible wall thickening versus underdistention in the distal ascending and transverse colon. Colitis not excluded. 9. Chronic gastric fold thickening. 10. Cystitis versus bladder nondistention with catheterized bladder. Correlate with urinalysis findings. 11. 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 12. Ulcerative plaque of the right anterolateral infrarenal aortic segment, unchanged since 02/21/2022 but not seen in 2019. No adjacent stranding or evidence of gross leakage. 13. Umbilical and  inguinal fat hernias. 14. Severe acquired spinal stenosis L4-5. 15. 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 16. Emphysema  Raymondo Band, MD [facility] [phone] pager    10/05/2022, 1:32 PM

## 2022-10-05 NOTE — ED Notes (Signed)
ED TO INPATIENT HANDOFF REPORT  ED Nurse Name and Phone #: Gustavo Lah, rn [phone]  S Name/Age/Gender Caleb [name] 72 y.o. male Room/Bed: 035C/035C  Code Status   Code Status: Prior  Home/SNF/Other Home Patient oriented to: self, place, time, and situation Is this baseline? Yes   Triage Complete: Triage complete  Chief Complaint Sepsis [facility]) [A41.9]  Triage Note No notes on file   Allergies Allergies  Allergen Reactions   Bactrim [Sulfamethoxazole-Trimethoprim] Other (See Comments)    "Made potassium go high and sodium go low"   Lisinopril Cough and Other (See Comments)   Losartan Swelling    Pt's daughter stated it made pt's blood pressure and sodium too low, but pt has been tolerating irbesartan.   Pletal [Cilostazol] Swelling and Other (See Comments)    Site of swelling not recalled by the patient    Level of Care/Admitting Diagnosis ED Disposition     ED Disposition  Admit   Condition  --   Comment  Hospital Area: [facility] [[id number]]  Level of Care: Telemetry Medical [104]  May place patient in observation at [facility] or [facility] if equivalent level of care is available:: Yes  Covid Evaluation: Symptomatic Person Under Investigation (PUI) or recent exposure (last 10 days) *Testing Required*  Diagnosis: Sepsis [facility]) [[id number]]  Admitting Physician: Orland Mustard [[id number]]  Attending Physician: Orland Mustard [[id number]]          B Medical/Surgery History Past Medical History:  Diagnosis Date   Acute respiratory failure with hypoxia (HCC)    Acute systolic congestive heart failure (HCC) 02/11/2014   COPD (chronic obstructive pulmonary disease) (HCC) 02/11/2014   Essential hypertension    Hypercholesteremia    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   LIMA to LAD, RIMA to RCA, SVG to OM1, EVH via right thigh   Tobacco abuse    Past Surgical History:  Procedure Laterality Date   BIOPSY  10/31/2018   Procedure: BIOPSY;  Surgeon: Jeani Hawking,  MD;  Location: [facility];  Service: Endoscopy;;   cataract surgery  Bilateral 12-2017 and 01-2018   COLONOSCOPY WITH PROPOFOL N/A 10/31/2018   Procedure: COLONOSCOPY WITH PROPOFOL;  Surgeon: Jeani Hawking, MD;  [facility];  Service: Endoscopy;  Laterality: N/A;   COLONOSCOPY WITH PROPOFOL N/A 09/18/2019   Procedure: COLONOSCOPY WITH PROPOFOL;  Surgeon: Jeani Hawking, MD;  [facility];  Service: Endoscopy;  Laterality: N/A;   COLONOSCOPY WITH PROPOFOL N/A 01/20/2021   Procedure: COLONOSCOPY WITH PROPOFOL;  Surgeon: Jeani Hawking, MD;  [facility];  Service: Endoscopy;  Laterality: N/A;   COLONOSCOPY WITH PROPOFOL N/A 11/03/2021   Procedure: COLONOSCOPY WITH PROPOFOL;  Surgeon: Jeani Hawking, MD;  [facility];  Service: Gastroenterology;  Laterality: N/A;   CORONARY ARTERY BYPASS GRAFT N/A 02/17/2014   Procedure: CORONARY ARTERY BYPASS GRAFTING (CABG);  Surgeon: Purcell Nails, MD;  Location: [facility] OR;  Service: Open Heart Surgery;  Laterality: N/A;  Times 3 using bilateral mammary arteries and endoscopically harvested right saphenous vein   ESOPHAGOGASTRODUODENOSCOPY (EGD) WITH PROPOFOL N/A 10/31/2018   Procedure: ESOPHAGOGASTRODUODENOSCOPY (EGD) WITH PROPOFOL;  Surgeon: Jeani Hawking, MD;  [facility];  Service: Endoscopy;  Laterality: N/A;   ESOPHAGOGASTRODUODENOSCOPY (EGD) WITH PROPOFOL N/A 09/18/2019   Procedure: ESOPHAGOGASTRODUODENOSCOPY (EGD) WITH PROPOFOL;  Surgeon: Jeani Hawking, MD;  [facility];  Service: Endoscopy;  Laterality: N/A;   FEMORAL ARTERY - FEMORAL ARTERY BYPASS GRAFT      right femoral artery to below-knee popliteal  artery bypass with PTFE and right first ray amputation 04/25/17   HEMOSTASIS CLIP PLACEMENT  10/31/2018   Procedure: HEMOSTASIS CLIP PLACEMENT;  Surgeon: Jeani Hawking, MD;  [facility];  Service: Endoscopy;;   HEMOSTASIS CLIP PLACEMENT  09/18/2019   Procedure: HEMOSTASIS CLIP PLACEMENT;   Surgeon: Jeani Hawking, MD;  [facility];  Service: Endoscopy;;   HEMOSTASIS CLIP PLACEMENT  01/20/2021   Procedure: HEMOSTASIS CLIP PLACEMENT;  Surgeon: Jeani Hawking, MD;  [facility];  Service: Endoscopy;;   INTRAOPERATIVE TRANSESOPHAGEAL ECHOCARDIOGRAM N/A 02/17/2014   Procedure: INTRAOPERATIVE TRANSESOPHAGEAL ECHOCARDIOGRAM;  Surgeon: Purcell Nails, MD;  Location: [facility] OR;  Service: Open Heart Surgery;  Laterality: N/A;   LEFT HEART CATHETERIZATION WITH CORONARY ANGIOGRAM N/A 02/12/2014   Procedure: LEFT HEART CATHETERIZATION WITH CORONARY ANGIOGRAM;  Surgeon: Lesleigh Noe, MD;  Location: [facility] CATH LAB;  Service: Cardiovascular;  Laterality: N/A;   POLYPECTOMY  10/31/2018   Procedure: POLYPECTOMY;  Surgeon: Jeani Hawking, MD;  [facility];  Service: Endoscopy;;   POLYPECTOMY  09/18/2019   Procedure: POLYPECTOMY;  Surgeon: Jeani Hawking, MD;  [facility];  Service: Endoscopy;;   POLYPECTOMY  01/20/2021   Procedure: POLYPECTOMY;  Surgeon: Jeani Hawking, MD;  [facility];  Service: Endoscopy;;   POLYPECTOMY  11/03/2021   Procedure: POLYPECTOMY;  Surgeon: Jeani Hawking, MD;  [facility];  Service: Gastroenterology;;   Susa Day  09/18/2019   Procedure: Susa Day;  Surgeon: Jeani Hawking, MD;  [facility];  Service: Endoscopy;;   SUBMUCOSAL INJECTION  09/18/2019   Procedure: SUBMUCOSAL INJECTION;  Surgeon: Jeani Hawking, MD;  [facility];  Service: Endoscopy;;   SUBMUCOSAL LIFTING INJECTION  10/31/2018   Procedure: SUBMUCOSAL LIFTING INJECTION;  Surgeon: Jeani Hawking, MD;  [facility];  Service: Endoscopy;;   TOE AMPUTATION Right    right great toe      A IV Location/Drains/Wounds Patient Lines/Drains/Airways Status     Active Line/Drains/Airways     Name Placement date Placement time Site Days   Peripheral IV 10/04/22 18 G Anterior;Right Forearm 10/04/22  2327  Forearm  1   Peripheral  IV 10/04/22 20 G Anterior;Left Forearm 10/04/22  2315  Forearm  1   Wound / Incision (Open or Dehisced) 09/04/20 Laceration Toe (Comment  which one) Right Laceration to 2nd toe at distal joint, bleeding controlled with combat gauze 09/04/20  1845  Toe (Comment  which one)  761   Wound / Incision (Open or Dehisced) 01/13/22 Other (Comment) Thigh Anterior;Left;Proximal Ulcer/open wound (Lateral aspect of Left hip) 01/13/22  1407  Thigh  265            Intake/Output Last 24 hours  Intake/Output Summary (Last 24 hours) at 10/05/2022 0815 Last data filed at 10/05/2022 1610 Gross per 24 hour  Intake 1948.37 ml  Output --  Net 1948.37 ml    Labs/Imaging Results for orders placed or performed during the hospital encounter of 10/04/22 (from the past 48 hour(s))  Resp panel by RT-PCR (RSV, Flu A&B, Covid) Urine, Catheterized     Status: None   Collection Time: 10/04/22 11:19 PM   Specimen: Urine, Catheterized; Nasal Swab  Result Value Ref Range   SARS Coronavirus 2 by RT PCR NEGATIVE NEGATIVE   Influenza A by PCR NEGATIVE NEGATIVE   Influenza B by PCR NEGATIVE NEGATIVE    Comment: (NOTE) The Xpert Xpress SARS-CoV-2/FLU/RSV plus assay is intended as an aid in the diagnosis of influenza from Nasopharyngeal swab specimens and should  not be used as a sole basis for treatment. Nasal washings and aspirates are unacceptable for Xpert Xpress SARS-CoV-2/FLU/RSV testing.  Fact Sheet for Patients: BloggerCourse.com  Fact Sheet for Healthcare Providers: SeriousBroker.it  This test is not yet approved or cleared by the [place] FDA and has been authorized for detection and/or diagnosis of SARS-CoV-2 by FDA under an Emergency Use Authorization (EUA). This EUA will remain in effect (meaning this test can be used) for the duration of the COVID-19 declaration under Section 564(b)(1) of the Act, 21 U.S.C. section 360bbb-3(b)(1), unless the  authorization is terminated or revoked.     Resp Syncytial Virus by PCR NEGATIVE NEGATIVE    Comment: (NOTE) Fact Sheet for Patients: BloggerCourse.com  Fact Sheet for Healthcare Providers: SeriousBroker.it  This test is not yet approved or cleared by the [place] FDA and has been authorized for detection and/or diagnosis of SARS-CoV-2 by FDA under an Emergency Use Authorization (EUA). This EUA will remain in effect (meaning this test can be used) for the duration of the COVID-19 declaration under Section 564(b)(1) of the Act, 21 U.S.C. section 360bbb-3(b)(1), unless the authorization is terminated or revoked.  Performed at [facility], [address] [place], [place] [address]   CBC with Differential     Status: Abnormal   Collection Time: 10/04/22 11:20 PM  Result Value Ref Range   WBC 2.2 (L) 4.0 - 10.5 K/uL   RBC 4.38 4.22 - 5.81 MIL/uL   Hemoglobin 12.3 (L) 13.0 - 17.0 g/dL   HCT 60.4 (L) 54.0 - 98.1 %   MCV 88.4 80.0 - 100.0 fL   MCH 28.1 26.0 - 34.0 pg   MCHC 31.8 30.0 - 36.0 g/dL   RDW 19.1 47.8 - 29.5 %   Platelets 125 (L) 150 - 400 K/uL    Comment: REPEATED TO VERIFY   nRBC 0.0 0.0 - 0.2 %   Neutrophils Relative % 92 %   Neutro Abs 2.1 1.7 - 7.7 K/uL   Lymphocytes Relative 7 %   Lymphs Abs 0.2 (L) 0.7 - 4.0 K/uL   Monocytes Relative 1 %   Monocytes Absolute 0.0 (L) 0.1 - 1.0 K/uL   Eosinophils Relative 0 %   Eosinophils Absolute 0.0 0.0 - 0.5 K/uL   Basophils Relative 0 %   Basophils Absolute 0.0 0.0 - 0.1 K/uL   Immature Granulocytes 0 %   Abs Immature Granulocytes 0.00 0.00 - 0.07 K/uL    Comment: Performed at [facility], [address] [place], [place] [address]  Comprehensive metabolic panel     Status: Abnormal   Collection Time: 10/04/22 11:20 PM  Result Value Ref Range   Sodium 137 135 - 145 mmol/L   Potassium 4.1 3.5 - 5.1 mmol/L   Chloride 98 98 - 111 mmol/L   CO2 24 22 -  32 mmol/L   Glucose, Bld 139 (H) 70 - 99 mg/dL    Comment: Glucose reference range applies only to samples taken after fasting for at least 8 hours.   BUN 14 8 - 23 mg/dL   Creatinine, Ser 8.65 0.61 - 1.24 mg/dL   Calcium 9.3 8.9 - 78.4 mg/dL   Total Protein 7.3 6.5 - 8.1 g/dL   Albumin 3.3 (L) 3.5 - 5.0 g/dL   AST 96 (H) 15 - 41 U/L   ALT 49 (H) 0 - 44 U/L   Alkaline Phosphatase 226 (H) 38 - 126 U/L   Total Bilirubin 1.3 (H) 0.3 -  1.2 mg/dL   GFR, Estimated >40 >98 mL/min    Comment: (NOTE) Calculated using the CKD-EPI Creatinine Equation (2021)    Anion gap 15 5 - 15    Comment: Performed at [facility], [address], [place], [place] [address]  Protime-INR     Status: Abnormal   Collection Time: 10/04/22 11:20 PM  Result Value Ref Range   Prothrombin Time 16.9 (H) 11.4 - 15.2 seconds   INR 1.4 (H) 0.8 - 1.2    Comment: (NOTE) INR goal varies based on device and disease states. Performed at [facility], [address], [place], [place] [address]   APTT     Status: None   Collection Time: 10/04/22 11:20 PM  Result Value Ref Range   aPTT 26 24 - 36 seconds    Comment: Performed at [facility], [address] [place], [place] [address]  Lipase, blood     Status: None   Collection Time: 10/04/22 11:20 PM  Result Value Ref Range   Lipase 26 11 - 51 U/L    Comment: Performed at [facility], [address], [place], [place] [address]  Brain natriuretic peptide     Status: Abnormal   Collection Time: 10/04/22 11:20 PM  Result Value Ref Range   B Natriuretic Peptide 333.2 (H) 0.0 - 100.0 pg/mL    Comment: Performed at [facility], [address] [place], [place] [address]  Urinalysis, w/ Reflex to Culture (Infection Suspected) -Urine, Catheterized     Status: Abnormal   Collection Time: 10/04/22 11:20 PM  Result Value Ref Range   Specimen Source URINE, CATHETERIZED    Color, Urine AMBER (A) YELLOW    Comment: BIOCHEMICALS MAY BE AFFECTED  BY COLOR   APPearance CLOUDY (A) CLEAR   Specific Gravity, Urine 1.016 1.005 - 1.030   pH 7.0 5.0 - 8.0   Glucose, UA NEGATIVE NEGATIVE mg/dL   Hgb urine dipstick MODERATE (A) NEGATIVE   Bilirubin Urine NEGATIVE NEGATIVE   Ketones, ur NEGATIVE NEGATIVE mg/dL   Protein, ur 952 (A) NEGATIVE mg/dL   Nitrite NEGATIVE NEGATIVE   Leukocytes,Ua MODERATE (A) NEGATIVE   RBC / HPF >50 0 - 5 RBC/hpf   WBC, UA >50 0 - 5 WBC/hpf    Comment:        Reflex urine culture not performed if WBC <=10, OR if Squamous epithelial cells >5. If Squamous epithelial cells >5 suggest recollection.    Bacteria, UA MANY (A) NONE SEEN   Squamous Epithelial / HPF 0-5 0 - 5 /HPF   Mucus PRESENT     Comment: Performed at [facility], [address], [place], [place] [address]  Troponin I (High Sensitivity)     Status: None   Collection Time: 10/04/22 11:20 PM  Result Value Ref Range   Troponin I (High Sensitivity) 7 <18 ng/L    Comment: (NOTE) Elevated high sensitivity troponin I (hsTnI) values and significant  changes across serial measurements may suggest ACS but many other  chronic and acute conditions are known to elevate hsTnI results.  Refer to the "Links" section for chest pain algorithms and additional  guidance. Performed at [facility], [address] [place], [place] [address]   Lactic acid, plasma     Status: Abnormal   Collection Time: 10/04/22 11:34 PM  Result Value Ref Range   Lactic Acid, Venous 2.2 (HH) 0.5 - 1.9 mmol/L  Comment: CRITICAL RESULT CALLED TO, READ BACK BY AND VERIFIED WITH Henrine Screws RN [phone] 0021 M.[facility] Performed at [facility], [address], [place], [place] [address]   I-Stat venous blood gas, ED ([facility])     Status: Abnormal   Collection Time: 10/04/22 11:41 PM  Result Value Ref Range   pH, Ven 7.377 7.25 - 7.43   pCO2, Ven 42.7 (L) 44 - 60 mmHg   pO2, Ven 30 (LL) 32 - 45 mmHg   Bicarbonate 25.1 20.0 - 28.0 mmol/L   TCO2 26 22 - 32  mmol/L   O2 Saturation 55 %   Acid-Base Excess 0.0 0.0 - 2.0 mmol/L   Sodium 135 135 - 145 mmol/L   Potassium 4.1 3.5 - 5.1 mmol/L   Calcium, Ion 1.17 1.15 - 1.40 mmol/L   HCT 39.0 39.0 - 52.0 %   Hemoglobin 13.3 13.0 - 17.0 g/dL   Sample type VENOUS    Comment NOTIFIED PHYSICIAN   I-Stat Lactic Acid, ED     Status: Abnormal   Collection Time: 10/04/22 11:42 PM  Result Value Ref Range   Lactic Acid, Venous 2.1 (HH) 0.5 - 1.9 mmol/L   Comment NOTIFIED PHYSICIAN   I-Stat CG4 Lactic Acid     Status: None   Collection Time: 10/05/22  1:44 AM  Result Value Ref Range   Lactic Acid, Venous 1.0 0.5 - 1.9 mmol/L  Ammonia     Status: Abnormal   Collection Time: 10/05/22  7:06 AM  Result Value Ref Range   Ammonia 39 (H) 9 - 35 umol/L    Comment: Performed at [facility], [address] [place], [place] [address]  I-Stat CG4 Lactic Acid     Status: None   Collection Time: 10/05/22  7:27 AM  Result Value Ref Range   Lactic Acid, Venous 1.4 0.5 - 1.9 mmol/L   US Abdomen Limited  Result Date: 10/05/2022 CLINICAL DATA:  Suspected wall thickening of the gallbladder on CT. Right upper quadrant pain. EXAM: ULTRASOUND ABDOMEN LIMITED RIGHT UPPER QUADRANT COMPARISON:  Contemporaneous CT. Ultrasound complete abdomen 05/17/2017. FINDINGS: Gallbladder: There are no shadowing formed stones but there is a small volume of intraluminal layering echogenic sludge. There is no positive sonographic Murphy's sign but the free wall is slightly thickened measuring 4.2 mm, with no pericholecystic fluid. Common bile duct: Diameter: 4.6 mm with no intrahepatic biliary dilatation. Liver: No focal lesion identified. The parenchyma is mildly hyperechoic consistent with steatosis. Portal vein is patent on color Doppler imaging with normal direction of blood flow towards the liver. Other: None. IMPRESSION: 1. Mild gallbladder wall thickening with intraluminal sludge but no formed stones, pericholecystic fluid, or  positive sonographic Murphy's sign. 2. No biliary dilatation. 3. Mild hepatic steatosis. Electronically Signed   By: Almira Bar M.D.   On: 10/05/2022 05:08   CT Angio Chest PE W and/or Wo Contrast  Result Date: 10/05/2022 CLINICAL DATA:  Chest pain and suspected pulmonary embolism, abdominal pain and suspected bowel obstruction. EXAM: CT ANGIOGRAPHY CHEST CT ABDOMEN AND PELVIS WITH CONTRAST TECHNIQUE: Multidetector CT imaging of the chest was performed using the standard protocol during bolus administration of intravenous contrast. Multiplanar CT image reconstructions and MIPs were obtained to evaluate the vascular anatomy. Multidetector CT imaging of the abdomen and pelvis was performed using the standard protocol during bolus administration of intravenous contrast. RADIATION DOSE REDUCTION: This exam was performed according to the departmental dose-optimization program which includes automated exposure control, adjustment of  the mA and/or kV according to patient size and/or use of iterative reconstruction technique. CONTRAST:  75mL OMNIPAQUE IOHEXOL 350 MG/ML SOLN COMPARISON:  Low-dose lung cancer screening chest CTs dated 06/27/2021 and 12/28/2019, CTA aortogram and iliofemoral runoff 02/21/2022, and CTA aortogram and iliofemoral runoff 09/25/2019. Only the images are available from these studies. Reports are not in PACS at this time. FINDINGS: CTA CHEST FINDINGS Cardiovascular: The pulmonary trunk upper normal in caliber at 2.7 cm. No arterial embolism is seen through the segmental level. The subsegmental arteries are largely obscured on this study due to respiratory motion. The pulmonary veins are nondistended. Mild panchamber cardiomegaly is similar to the prior studies with heavy native CAD and old CABG changes. There are moderate calcific plaques in the great vessels, without significant focal stenosis, and moderate to heavy calcification in the thoracic aorta without aneurysm, dissection or  stenosis. Mediastinum/Nodes: No thyroid or axillary mass. There are no enlarged intrathoracic lymph nodes. There is mild layering fluid in the trachea consistent with retained secretions or aspirate. Both main bronchi appear clear. Thoracic esophagus is unremarkable. Lungs/Pleura: There are mild features of centrilobular emphysema. Trace left pleural effusion from prior studies without pneumothorax. There is no right pleural effusion. There is linear scarring anteriorly in both upper lobes. Diffuse bronchial thickening chronically noted but today is greater than previously. There are scattered small bronchiolar subsegmental branch impactions in the lower lobes but no consolidation or other acute pneumonic process. No evidence of pulmonary nodules. 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 No acute or other significant osseous findings or destructive lesions. Additional healed fractures elsewhere in the left rib cage. There is moderate bilateral gynecomastia on the left-greater-than-right. The visualized chest wall is otherwise unremarkable. CT ABDOMEN and PELVIS FINDINGS Hepatobiliary: There are occasional calcified hepatic granulomas. The liver is slightly steatotic, without mass enhancement. Gallbladder is mildly distended. No calcified gallstones seen but some images especially the reconstructed images suggest mild wall thickening. Consider ultrasound follow-up.  No biliary dilatation. Pancreas: The pancreas is somewhat short but this is probably on the basis of normal variant development. No mass or ductal dilatation are seen. Spleen: No abnormality. Adrenals/Urinary Tract: Areas of chronic cortical volume loss left kidney. No adrenal mass. There are a few too small to characterize hypodensities of the left kidney consistent with Bosniak 2 cysts. No follow-up imaging is recommended. There is no renal mass  enhancement. There is no urinary stone or obstruction. Small extrarenal pelves are again noted. The bladder is catheterized and contracted but even so does appear somewhat thickened. This was the case on the last CT as well. Correlate with urinalysis findings. Stomach/Bowel: The stomach, as before demonstrates moderate fold thickening but no more than previously. The unopacified small bowel and appendix are unremarkable. There are opacities in the cecum most likely undigested medication. There is either wall thickening or changes of underdistention in the distal ascending and transverse colon. Rest of the colon wall is unremarkable. Vascular/Lymphatic: Extensive aortic and branch vessel atherosclerosis. There is a stent in the left external iliac artery. Likely calcific occlusion in both internal iliac arteries and both common iliac arteries. Proximal outflow arteries are heavily calcified and there is a partially visible right femoral artery bypass graft. Ulcerative plaque of the right anterolateral infrarenal aortic segment is unchanged grossly since 02/21/2022 and appears to be penetrating ulceration on 5:44, and this was not seen in 2019. No abdominal, pelvic or inguinal adenopathy  is seen. Reproductive: No prostatomegaly. Both testicles are in the scrotal sac. Other: No free hemorrhage, free fluid, free air or incarcerated hernia. Small umbilical fat hernia. Small inguinal fat hernias. Musculoskeletal: Lumbar degenerative disc change and spondylosis L2-3 down, slight dextrorotary lumbar scoliosis. No acute or significant osseous findings. Severe acquired spinal stenosis L4-5. Review of the MIP images confirms the above findings. IMPRESSION: 1. No evidence of arterial embolus through the segmental arteries. Subsegmental arteries are largely obscured by respiratory motion. 2. Cardiomegaly without evidence of CHF. 3. 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 4. Layering fluid in the trachea  consistent with retained secretions or aspirate. 5. COPD with bronchitis and scattered small bronchiolar subsegmental branch impactions in the lower lobes. No focal pneumonia. 6. Trace left pleural effusion new from prior studies. 7. Mildly distended gallbladder with some images suggesting mild wall thickening. Ultrasound follow-up recommended. No calcified stones are seen. 8. Possible wall thickening versus underdistention in the distal ascending and transverse colon. Colitis not excluded. 9. Chronic gastric fold thickening. 10. Cystitis versus bladder nondistention with catheterized bladder. Correlate with urinalysis findings. 11. 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 12. Ulcerative plaque of the right anterolateral infrarenal aortic segment, unchanged since 02/21/2022 but not seen in 2019. No adjacent stranding or evidence of gross leakage. 13. Umbilical and inguinal fat hernias. 14. Severe acquired spinal stenosis L4-5. 15. 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 16. Emphysema. Aortic Atherosclerosis (ICD10-I70.0) and Emphysema (ICD10-J43.9). Electronically Signed   By: Almira Bar M.D.   On: 10/05/2022 03:18   CT ABDOMEN PELVIS W CONTRAST  Result Date: 10/05/2022 CLINICAL DATA:  Chest pain and suspected pulmonary embolism, abdominal pain and suspected bowel obstruction. EXAM: CT ANGIOGRAPHY CHEST CT ABDOMEN AND PELVIS WITH CONTRAST TECHNIQUE: Multidetector CT imaging of the chest was performed using the standard protocol during bolus administration of intravenous contrast. Multiplanar CT image reconstructions and MIPs were obtained to evaluate the vascular anatomy. Multidetector CT imaging of the abdomen and pelvis was performed using the standard protocol during bolus administration of intravenous contrast. RADIATION  DOSE REDUCTION: This exam was performed according to the departmental dose-optimization program which includes automated exposure control, adjustment of the mA and/or kV according to patient size and/or use of iterative reconstruction technique. CONTRAST:  75mL OMNIPAQUE IOHEXOL 350 MG/ML SOLN COMPARISON:  Low-dose lung cancer screening chest CTs dated 06/27/2021 and 12/28/2019, CTA aortogram and iliofemoral runoff 02/21/2022, and CTA aortogram and iliofemoral runoff 09/25/2019. Only the images are available from these studies. Reports are not in PACS at this time. FINDINGS: CTA CHEST FINDINGS Cardiovascular: The pulmonary trunk upper normal in caliber at 2.7 cm. No arterial embolism is seen through the segmental level. The subsegmental arteries are largely obscured on this study due to respiratory motion. The pulmonary veins are nondistended. Mild panchamber cardiomegaly is similar to the prior studies with heavy native CAD and old CABG changes. There are moderate calcific plaques in the great vessels, without significant focal stenosis, and moderate to heavy calcification in the thoracic aorta without aneurysm, dissection or stenosis. Mediastinum/Nodes: No thyroid or axillary mass. There are no enlarged intrathoracic lymph nodes. There is mild layering fluid in the trachea consistent with retained secretions or aspirate. Both main bronchi appear clear. Thoracic esophagus is unremarkable. Lungs/Pleura: There are mild features of centrilobular emphysema. Trace left pleural effusion from prior studies without pneumothorax. There  is no right pleural effusion. There is linear scarring anteriorly in both upper lobes. Diffuse bronchial thickening chronically noted but today is greater than previously. There are scattered small bronchiolar subsegmental branch impactions in the lower lobes but no consolidation or other acute pneumonic process. No evidence of pulmonary nodules. 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 No acute or other significant osseous findings or destructive lesions. Additional healed fractures elsewhere in the left rib cage. There is moderate bilateral gynecomastia on the left-greater-than-right. The visualized chest wall is otherwise unremarkable. CT ABDOMEN and PELVIS FINDINGS Hepatobiliary: There are occasional calcified hepatic granulomas. The liver is slightly steatotic, without mass enhancement. Gallbladder is mildly distended. No calcified gallstones seen but some images especially the reconstructed images suggest mild wall thickening. Consider ultrasound follow-up.  No biliary dilatation. Pancreas: The pancreas is somewhat short but this is probably on the basis of normal variant development. No mass or ductal dilatation are seen. Spleen: No abnormality. Adrenals/Urinary Tract: Areas of chronic cortical volume loss left kidney. No adrenal mass. There are a few too small to characterize hypodensities of the left kidney consistent with Bosniak 2 cysts. No follow-up imaging is recommended. There is no renal mass enhancement. There is no urinary stone or obstruction. Small extrarenal pelves are again noted. The bladder is catheterized and contracted but even so does appear somewhat thickened. This was the case on the last CT as well. Correlate with urinalysis findings. Stomach/Bowel: The stomach, as before demonstrates moderate fold thickening but no more than previously. The unopacified small bowel and appendix are unremarkable. There are opacities in the cecum most likely undigested medication. There is either wall thickening or changes of underdistention in the distal ascending and transverse colon. Rest of the colon wall is unremarkable. Vascular/Lymphatic: Extensive aortic and branch vessel atherosclerosis. There is a stent in the left external iliac artery. Likely calcific occlusion in both internal  iliac arteries and both common iliac arteries. Proximal outflow arteries are heavily calcified and there is a partially visible right femoral artery bypass graft. Ulcerative plaque of the right anterolateral infrarenal aortic segment is unchanged grossly since 02/21/2022 and appears to be penetrating ulceration on 5:44, and this was not seen in 2019. No abdominal, pelvic or inguinal adenopathy is seen. Reproductive: No prostatomegaly. Both testicles are in the scrotal sac. Other: No free hemorrhage, free fluid, free air or incarcerated hernia. Small umbilical fat hernia. Small inguinal fat hernias. Musculoskeletal: Lumbar degenerative disc change and spondylosis L2-3 down, slight dextrorotary lumbar scoliosis. No acute or significant osseous findings. Severe acquired spinal stenosis L4-5. Review of the MIP images confirms the above findings. IMPRESSION: 1. No evidence of arterial embolus through the segmental arteries. Subsegmental arteries are largely obscured by respiratory motion. 2. Cardiomegaly without evidence of CHF. 3. 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 4. Layering fluid in the trachea consistent with retained secretions or aspirate. 5. COPD with bronchitis and scattered small bronchiolar subsegmental branch impactions in the lower lobes. No focal pneumonia. 6. Trace left pleural effusion new from prior studies. 7. Mildly distended gallbladder with some images suggesting mild wall thickening. Ultrasound follow-up recommended. No calcified stones are seen. 8. Possible wall thickening versus underdistention in the distal ascending and transverse colon. Colitis not excluded. 9. Chronic gastric fold thickening. 10. Cystitis versus bladder nondistention with catheterized bladder. Correlate with urinalysis findings. 11. 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 12. Ulcerative plaque  of the right anterolateral infrarenal aortic segment, unchanged since 02/21/2022 but not seen in 2019. No adjacent stranding or evidence of gross leakage. 13. Umbilical and inguinal fat hernias. 14. Severe acquired spinal stenosis L4-5. 15. 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 16. Emphysema. Aortic Atherosclerosis (ICD10-I70.0) and Emphysema (ICD10-J43.9). Electronically Signed   By: Almira Bar M.D.   On: 10/05/2022 03:18   DG Chest Port 1 View  Result Date: 10/04/2022 CLINICAL DATA:  Questionable sepsis EXAM: PORTABLE CHEST 1 VIEW COMPARISON:  Chest x-ray 01/12/2022 FINDINGS: Sternotomy wires are present. The heart size and mediastinal contours are within normal limits. Both lungs are clear. No acute osseous findings. IMPRESSION: No active disease. Electronically Signed   By: Darliss Cheney M.D.   On: 10/04/2022 23:59    Pending Labs Unresulted Labs (From admission, onward)     Start     Ordered   10/05/22 4034  Hepatitis panel, acute  Once,   URGENT        10/05/22 7425   10/04/22 2320  Urine Culture  Once,   R        10/04/22 2320   10/04/22 2314  Blood Culture (routine x 2)  (Undifferentiated presentation (screening labs and basic nursing orders))  BLOOD CULTURE X 2,   STAT      10/04/22 2314            Vitals/Pain Today's Vitals   10/05/22 0500 10/05/22 0600 10/05/22 0658 10/05/22 0800  BP: (!) 132/52 (!) 131/51 (!) 139/54 (!) 141/54  Pulse: 68 66 65 62  Resp: 18 18 19 19   Temp:   98.9 F (37.2 C) 98.8 F (37.1 C)  TempSrc:   Axillary Axillary  SpO2: 93% 93% 95% 94%  Weight:      Height:      PainSc:        Isolation Precautions No active isolations  Medications Medications  acetaminophen (TYLENOL) tablet 650 mg (650 mg Oral Given 10/05/22 0027)  lactated ringers infusion ( Intravenous New Bag/Given 10/05/22 0625)  piperacillin-tazobactam (ZOSYN) IVPB 3.375 g (3.375 g Intravenous New  Bag/Given 10/05/22 0719)  sodium chloride 0.9 % bolus 500 mL (0 mLs Intravenous Stopped 10/05/22 0110)  ondansetron (ZOFRAN) injection 4 mg (4 mg Intravenous Given 10/05/22 0027)  ceFEPIme (MAXIPIME) 2 g in sodium chloride 0.9 % 100 mL IVPB (0 g Intravenous Stopped 10/05/22 0126)  metroNIDAZOLE (FLAGYL) IVPB 500 mg (0 mg Intravenous Stopped 10/05/22 0303)  vancomycin (VANCOREADY) IVPB 1250 mg/250 mL (0 mg Intravenous Stopped 10/05/22 0436)  lactated ringers bolus 1,000 mL (0 mLs Intravenous Stopped 10/05/22 0621)  iohexol (OMNIPAQUE) 350 MG/ML injection 75 mL (75 mLs Intravenous Contrast Given 10/05/22 0206)    Mobility walks with person assist     Focused Assessments     R Recommendations: See Admitting Provider Note  Report given to:   Additional Notes:

## 2022-10-05 NOTE — Assessment & Plan Note (Addendum)
status post stent placement with angioplasty of the left lower extremity in September 2001 and on Xarelto sees vascular surgery Dr. Everardo Beals, no claudication.  -continue statin and xarelto and ASA

## 2022-10-05 NOTE — Assessment & Plan Note (Signed)
-  Nicotine patch

## 2022-10-05 NOTE — Assessment & Plan Note (Signed)
Continue daily iron Hgb stable.

## 2022-10-05 NOTE — Progress Notes (Signed)
ED Pharmacy Antibiotic Sign Off An antibiotic consult was received from an ED provider for cefepime and vancomycin per pharmacy dosing for sepsis. A chart review was completed to assess appropriateness.   The following one time order(s) were placed:  Cefepime 2g  x 1  Vancomycin 1250mg  x 1   Further antibiotic and/or antibiotic pharmacy consults should be ordered by the admitting provider if indicated.   Thank you for allowing pharmacy to be a part of this patient's care.   Marja Kays, [facility]  Clinical Pharmacist 10/05/22 12:55 AM

## 2022-10-05 NOTE — Assessment & Plan Note (Addendum)
Right trochanter: two stage II ulcers. One healed. 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 MRI 12/2021 with dx and Dr. Mardene Speak performed excision 02/15/2022 with OR cultures with MRSA. He was placed on daptomycin for 6 weeks to end on 04/09/22

## 2022-10-05 NOTE — Assessment & Plan Note (Addendum)
No s/sx of exacerbation, no wheezing on exam.  Written for trellegy, but does not take this-will add back  Schuduled duo nebs  SABA prn

## 2022-10-05 NOTE — Assessment & Plan Note (Signed)
Continue lipitor 80mg  since LFT <3x upper limits of normal

## 2022-10-05 NOTE — H&P (Addendum)
History and Physical    Patient: Caleb [name] AOZ:[id number] DOB: Oct 20, 1950 DOA: 10/04/2022 DOS: the patient was seen and examined on 10/05/2022 PCP: Mattie Marlin, DO  Patient coming from: Home - lives alone. Uses walker to ambulate.    Chief Complaint: shortness of breath   HPI: Caleb [name] is a 72 y.o. male with medical history significant of  HTN, CAD status post CABG, PAD status post right femoral to popliteal bypass and bilateral iliac stenting, chronic HFmrEF, SIADH, COPD, cervical spinal stenosis with chronic pain, and left hip ulcer with trochanteric osteomyelitis and chronic indwelling urinary catheter.  Patient came to the ER with shortness of breath. He states his shortness of breath started yesterday afternoon. He felt like his stomach bloated as well. He also has a wet cough that started yesterday as well. He was not eating when symptoms started. He states family has had cold like symptoms a few days ago. He denies any fever, congestion, sinus pain or pressure, body aches, ore headaches. He has been eating and drinking well at home.   Denies any fever/chills, vision changes/headaches, chest pain or palpitations, abdominal pain, N/V/D, leg swelling.   He smokes about 12 cigarettes/day. No alcohol. Quit drinking in 02/2022   ER Course:  vitals: temp: 101.9, bp: 149/70, HR: 83, RR: 20, oxygen: 98%RA Pertinent labs: wbc: 2.2, hgb: 12.3, AST: 96, ALT: 49, AP: 226, INR: 1.4, BNP: 333, UA: moderate LE/ WBC >50, many bacteria, lactic acid: 2.2>2.1  CTA chest: No PE. Cardiomegaly without evidence of CHF. Mildly distended gallbladder with some images suggesting mild wall thickening . [place] f/u recommended. . 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 4. Layering fluid in the trachea consistent with retained secretions or aspirate. 5. COPD with bronchitis and scattered small bronchiolar subsegmental branch impactions in the lower lobes. No focal pneumonia. 6. Trace left pleural  effusion new from prior studies. 7. Mildly distended gallbladder with some images suggesting mild wall thickening. Ultrasound follow-up recommended. No calcified stones are seen. 8. Possible wall thickening versus underdistention in the distal ascending and transverse colon. Colitis not excluded. 9. Chronic gastric fold thickening. 10. Cystitis versus bladder nondistention with catheterized bladder. Correlate with urinalysis findings. 11. 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 12. Ulcerative plaque of the right anterolateral infrarenal aortic segment, unchanged since 02/21/2022 but not seen in 2019. No adjacent stranding or evidence of gross leakage. 13. Umbilical and inguinal fat hernias. 14. Severe acquired spinal stenosis L4-5. 15. 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 16. Emphysema.  US abdomen: [name] Mild gallbladder wall thickening with intraluminal sludge but no formed stones, pericholecystic fluid, or positive sonographic Murphy's sign. 2. No biliary dilatation. 3. Mild hepatic steatosis.  In ED: In ED given vanc, zosyn, cefepime and flagyl, 500cc then continuous IVF,  BC obtained. [facility] asked to admit    Review of Systems: As mentioned in the history of present illness. All other systems reviewed and are negative. Past Medical History:  Diagnosis Date   Acute respiratory failure with hypoxia (HCC)    Acute systolic congestive heart failure (HCC) 02/11/2014   COPD (chronic obstructive pulmonary disease) (HCC) 02/11/2014   Essential hypertension    Hypercholesteremia    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   LIMA to LAD, RIMA to RCA, SVG to OM1, EVH via right thigh   Tobacco abuse  Past Surgical History:  Procedure Laterality Date   BIOPSY  10/31/2018   Procedure: BIOPSY;  Surgeon: Jeani Hawking, MD;   Location: [facility];  Service: Endoscopy;;   cataract surgery  Bilateral 12-2017 and 01-2018   COLONOSCOPY WITH PROPOFOL N/A 10/31/2018   Procedure: COLONOSCOPY WITH PROPOFOL;  Surgeon: Jeani Hawking, MD;  Location: [facility];  Service: Endoscopy;  Laterality: N/A;   COLONOSCOPY WITH PROPOFOL N/A 09/18/2019   Procedure: COLONOSCOPY WITH PROPOFOL;  Surgeon: Jeani Hawking, MD;  Location: [facility];  Service: Endoscopy;  Laterality: N/A;   COLONOSCOPY WITH PROPOFOL N/A 01/20/2021   Procedure: COLONOSCOPY WITH PROPOFOL;  Surgeon: Jeani Hawking, MD;  Location: [facility];  Service: Endoscopy;  Laterality: N/A;   COLONOSCOPY WITH PROPOFOL N/A 11/03/2021   Procedure: COLONOSCOPY WITH PROPOFOL;  Surgeon: Jeani Hawking, MD;  Location: [facility];  Service: Gastroenterology;  Laterality: N/A;   CORONARY ARTERY BYPASS GRAFT N/A 02/17/2014   Procedure: CORONARY ARTERY BYPASS GRAFTING (CABG);  Surgeon: Purcell Nails, MD;  Location: [facility] OR;  Service: Open Heart Surgery;  Laterality: N/A;  Times 3 using bilateral mammary arteries and endoscopically harvested right saphenous vein   ESOPHAGOGASTRODUODENOSCOPY (EGD) WITH PROPOFOL N/A 10/31/2018   Procedure: ESOPHAGOGASTRODUODENOSCOPY (EGD) WITH PROPOFOL;  Surgeon: Jeani Hawking, MD;  Location: [facility];  Service: Endoscopy;  Laterality: N/A;   ESOPHAGOGASTRODUODENOSCOPY (EGD) WITH PROPOFOL N/A 09/18/2019   Procedure: ESOPHAGOGASTRODUODENOSCOPY (EGD) WITH PROPOFOL;  Surgeon: Jeani Hawking, MD;  Location: [facility];  Service: Endoscopy;  Laterality: N/A;   FEMORAL ARTERY - FEMORAL ARTERY BYPASS GRAFT      right femoral artery to below-knee popliteal artery bypass with PTFE and right first ray amputation 04/25/17   HEMOSTASIS CLIP PLACEMENT  10/31/2018   Procedure: HEMOSTASIS CLIP PLACEMENT;  Surgeon: Jeani Hawking, MD;  Location: [facility];  Service: Endoscopy;;   HEMOSTASIS CLIP PLACEMENT  09/18/2019   Procedure: HEMOSTASIS CLIP PLACEMENT;  Surgeon:  Jeani Hawking, MD;  Location: [facility];  Service: Endoscopy;;   HEMOSTASIS CLIP PLACEMENT  01/20/2021   Procedure: HEMOSTASIS CLIP PLACEMENT;  Surgeon: Jeani Hawking, MD;  Location: [facility];  Service: Endoscopy;;   INTRAOPERATIVE TRANSESOPHAGEAL ECHOCARDIOGRAM N/A 02/17/2014   Procedure: INTRAOPERATIVE TRANSESOPHAGEAL ECHOCARDIOGRAM;  Surgeon: Purcell Nails, MD;  Location[facility];  Service: Open Heart Surgery;  Laterality: N/A;   LEFT HEART CATHETERIZATION WITH CORONARY ANGIOGRAM N/A 02/12/2014   Procedure: LEFT HEART CATHETERIZATION WITH CORONARY ANGIOGRAM;  Surgeon: Lesleigh Noe, MD;  Location: [facility] CATH LAB;  Service: Cardiovascular;  Laterality: N/A;   POLYPECTOMY  10/31/2018   Procedure: POLYPECTOMY;  Surgeon: Jeani Hawking, MD;  Location: [facility];  Service: Endoscopy;;   POLYPECTOMY  09/18/2019   Procedure: POLYPECTOMY;  Surgeon: Jeani Hawking, MD;  Location: [facility];  Service: Endoscopy;;   POLYPECTOMY  01/20/2021   Procedure: POLYPECTOMY;  Surgeon: Jeani Hawking, MD;  Location: [facility];  Service: Endoscopy;;   POLYPECTOMY  11/03/2021   Procedure: POLYPECTOMY;  Surgeon: Jeani Hawking, MD;  Location: [facility];  Service: Gastroenterology;;   Susa Day  09/18/2019   Procedure: Susa Day;  Surgeon: Jeani Hawking, MD;  Location: [facility];  Service: Endoscopy;;   SUBMUCOSAL INJECTION  09/18/2019   Procedure: SUBMUCOSAL INJECTION;  Surgeon: Jeani Hawking, MD;  Location: [facility];  Service: Endoscopy;;   SUBMUCOSAL LIFTING INJECTION  10/31/2018   Procedure: SUBMUCOSAL LIFTING INJECTION;  Surgeon: Jeani Hawking, MD;  Location: [facility];  Service: Endoscopy;;   TOE AMPUTATION Right    right great toe    Social History:  reports that he has been smoking cigarettes. He started smoking about 43 years ago. He has a 17.5 pack-year smoking history. He has never used smokeless tobacco. He reports current alcohol use. He reports that he does not use  drugs.  Allergies  Allergen Reactions   Sulfamethoxazole-Trimethoprim Other (See Comments)    "Made potassium go high and sodium go low"   Lisinopril Cough and Other (See Comments)   Losartan Swelling    Pt's daughter stated it made pt's blood pressure and sodium too low, but pt has been tolerating irbesartan.   Pletal [Cilostazol] Swelling and Other (See Comments)    Site of swelling not recalled by the patient    Family History  Problem Relation Age of Onset   Heart attack Mother    Diabetes Mother    Heart attack Brother    Diabetes Brother    Cancer Sister    Stroke Neg Hx     Prior to Admission medications   Medication Sig Start Date End Date Taking? Authorizing Provider  acetaminophen (TYLENOL) 500 MG tablet Take 1,000 mg by mouth 2 (two) times daily as needed for mild pain or moderate pain.     [provider]  albuterol (VENTOLIN HFA) 108 (90 Base) MCG/ACT inhaler Inhale 2 puffs into the lungs every 6 (six) hours as needed for wheezing or shortness of breath. 07/24/18   Tomma Lightning, MD  alendronate (FOSAMAX) 70 MG tablet Take 1 tablet (70 mg total) by mouth once a week. 09/06/22     amLODipine (NORVASC) 5 MG tablet Take 1 tablet (5 mg total) by mouth daily. 08/24/21     aspirin EC 81 MG tablet Take 81 mg by mouth daily. Swallow whole.    [provider]  atorvastatin (LIPITOR) 80 MG tablet Take 1 tablet (80 mg total) by mouth daily for cholesterol 07/13/22     carvedilol (COREG) 25 MG tablet Take 1 tablet (25 mg total) by mouth 2 (two) times daily with a meal. 10/01/22     Cholecalciferol (VITAMIN D-3) 25 MCG (1000 UT) CAPS Take 1,000 Units by mouth daily with breakfast.    [provider]  ferrous sulfate 325 (65 FE) MG tablet Take 1 tablet (325 mg total) by mouth daily. 08/24/21   Barbie Banner, MD  fexofenadine (ALLEGRA) 180 MG tablet Take 180 mg by mouth daily.    [provider]  Fluticasone-Umeclidin-Vilant (TRELEGY ELLIPTA)  100-62.5-25 MCG/ACT AEPB Take 1 puff by mouth daily. 11/16/21     folic acid (FOLVITE) 1 MG tablet Take 1 tablet (1 mg total) by mouth daily. 09/06/22     irbesartan (AVAPRO) 300 MG tablet Take 1 tablet (300 mg total) by mouth daily. 10/01/22     nitroGLYCERIN (NITROSTAT) 0.4 MG SL tablet Place 0.4 mg under the tongue every 5 (five) minutes as needed for chest pain.    [provider]  Olopatadine HCl 0.2 % SOLN Place 1 drop into both eyes daily as needed (allergies).    [provider]  pantoprazole (PROTONIX) 40 MG tablet Take 1 tablet (40 mg total) by mouth daily. 10/01/22     pregabalin (LYRICA) 75 MG capsule Take 1 capsule (75 mg total) by mouth 3 (three) times daily. 07/31/22     rivaroxaban (XARELTO) 2.5 MG TABS tablet Take 2.5 mg by mouth 2 (two) times daily.    [provider]  sodium chloride 1 g tablet Take 1 tablet (1 g total) by mouth 2 (two) times  daily. 01/30/22     tamsulosin (FLOMAX) 0.4 MG CAPS capsule Take 1 capsule (0.4 mg total) by mouth daily. 12/20/21     thiamine (VITAMIN B1) 100 MG tablet Take 1 tablet (100 mg total) by mouth daily. 08/24/21     vitamin B-12 (CYANOCOBALAMIN) 1000 MCG tablet Take 1,000 mcg by mouth daily with breakfast.    [provider]  XARELTO 2.5 MG TABS tablet Take 1 tablet (2.5 mg total) by mouth 2 (two) times daily. 06/25/22       Physical Exam: Vitals:   10/05/22 1725 10/05/22 1732 10/05/22 1734 10/05/22 1740  BP:      Pulse:      Resp:      Temp:      TempSrc:      SpO2: (!) 86% 90% 92% 94%  Weight:      Height:       General:  Appears calm and comfortable and is in NAD Eyes:  PERRL, EOMI, normal lids, iris ENT:  grossly normal hearing, lips & tongue, mmm; dentures on top, edentulous on bottom  Neck:  no LAD, masses or thyromegaly; no carotid bruits Cardiovascular:  RRR, no m/r/g. No LE edema.  Respiratory:   CTA bilaterally with no wheezes/rales/rhonchi.  Normal respiratory effort. Abdomen:  soft, NT, ND,  NABS Back:   normal alignment, no CVAT Skin:  no rash or induration seen on limited exam Musculoskeletal:  grossly normal tone BUE/BLE, good ROM, no bony abnormality Lower extremity:  No LE edema.  Limited foot exam with no ulcerations.  2+ distal pulses. Psychiatric:  grossly normal mood and affect, speech fluent and appropriate, AOx3 Neurologic:  CN 2-12 grossly intact, moves all extremities in coordinated fashion, sensation intact   Radiological Exams on Admission: Independently reviewed - see discussion in A/P where applicable  US Abdomen Limited  Result Date: 10/05/2022 CLINICAL DATA:  Suspected wall thickening of the gallbladder on CT. Right upper quadrant pain. EXAM: ULTRASOUND ABDOMEN LIMITED RIGHT UPPER QUADRANT COMPARISON:  Contemporaneous CT. Ultrasound complete abdomen 05/17/2017. FINDINGS: Gallbladder: There are no shadowing formed stones but there is a small volume of intraluminal layering echogenic sludge. There is no positive sonographic Murphy's sign but the free wall is slightly thickened measuring 4.2 mm, with no pericholecystic fluid. Common bile duct: Diameter: 4.6 mm with no intrahepatic biliary dilatation. Liver: No focal lesion identified. The parenchyma is mildly hyperechoic consistent with steatosis. Portal vein is patent on color Doppler imaging with normal direction of blood flow towards the liver. Other: None. IMPRESSION: 1. Mild gallbladder wall thickening with intraluminal sludge but no formed stones, pericholecystic fluid, or positive sonographic Murphy's sign. 2. No biliary dilatation. 3. Mild hepatic steatosis. Electronically Signed   By: Almira Bar M.D.   On: 10/05/2022 05:08   CT Angio Chest PE W and/or Wo Contrast  Result Date: 10/05/2022 CLINICAL DATA:  Chest pain and suspected pulmonary embolism, abdominal pain and suspected bowel obstruction. EXAM: CT ANGIOGRAPHY CHEST CT ABDOMEN AND PELVIS WITH CONTRAST TECHNIQUE: Multidetector CT imaging of the chest  was performed using the standard protocol during bolus administration of intravenous contrast. Multiplanar CT image reconstructions and MIPs were obtained to evaluate the vascular anatomy. Multidetector CT imaging of the abdomen and pelvis was performed using the standard protocol during bolus administration of intravenous contrast. RADIATION DOSE REDUCTION: This exam was performed according to the departmental dose-optimization program which includes automated exposure control, adjustment of the mA and/or kV according to patient size and/or use of iterative reconstruction  technique. CONTRAST:  75mL OMNIPAQUE IOHEXOL 350 MG/ML SOLN COMPARISON:  Low-dose lung cancer screening chest CTs dated 06/27/2021 and 12/28/2019, CTA aortogram and iliofemoral runoff 02/21/2022, and CTA aortogram and iliofemoral runoff 09/25/2019. Only the images are available from these studies. Reports are not in PACS at this time. FINDINGS: CTA CHEST FINDINGS Cardiovascular: The pulmonary trunk upper normal in caliber at 2.7 cm. No arterial embolism is seen through the segmental level. The subsegmental arteries are largely obscured on this study due to respiratory motion. The pulmonary veins are nondistended. Mild panchamber cardiomegaly is similar to the prior studies with heavy native CAD and old CABG changes. There are moderate calcific plaques in the great vessels, without significant focal stenosis, and moderate to heavy calcification in the thoracic aorta without aneurysm, dissection or stenosis. Mediastinum/Nodes: No thyroid or axillary mass. There are no enlarged intrathoracic lymph nodes. There is mild layering fluid in the trachea consistent with retained secretions or aspirate. Both main bronchi appear clear. Thoracic esophagus is unremarkable. Lungs/Pleura: There are mild features of centrilobular emphysema. Trace left pleural effusion from prior studies without pneumothorax. There is no right pleural effusion. There is linear  scarring anteriorly in both upper lobes. Diffuse bronchial thickening chronically noted but today is greater than previously. There are scattered small bronchiolar subsegmental branch impactions in the lower lobes but no consolidation or other acute pneumonic process. No evidence of pulmonary nodules. 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 No acute or other significant osseous findings or destructive lesions. Additional healed fractures elsewhere in the left rib cage. There is moderate bilateral gynecomastia on the left-greater-than-right. The visualized chest wall is otherwise unremarkable. CT ABDOMEN and PELVIS FINDINGS Hepatobiliary: There are occasional calcified hepatic granulomas. The liver is slightly steatotic, without mass enhancement. Gallbladder is mildly distended. No calcified gallstones seen but some images especially the reconstructed images suggest mild wall thickening. Consider ultrasound follow-up.  No biliary dilatation. Pancreas: The pancreas is somewhat short but this is probably on the basis of normal variant development. No mass or ductal dilatation are seen. Spleen: No abnormality. Adrenals/Urinary Tract: Areas of chronic cortical volume loss left kidney. No adrenal mass. There are a few too small to characterize hypodensities of the left kidney consistent with Bosniak 2 cysts. No follow-up imaging is recommended. There is no renal mass enhancement. There is no urinary stone or obstruction. Small extrarenal pelves are again noted. The bladder is catheterized and contracted but even so does appear somewhat thickened. This was the case on the last CT as well. Correlate with urinalysis findings. Stomach/Bowel: The stomach, as before demonstrates moderate fold thickening but no more than previously. The unopacified small bowel and appendix are unremarkable. There are opacities in the cecum  most likely undigested medication. There is either wall thickening or changes of underdistention in the distal ascending and transverse colon. Rest of the colon wall is unremarkable. Vascular/Lymphatic: Extensive aortic and branch vessel atherosclerosis. There is a stent in the left external iliac artery. Likely calcific occlusion in both internal iliac arteries and both common iliac arteries. Proximal outflow arteries are heavily calcified and there is a partially visible right femoral artery bypass graft. Ulcerative plaque of the right anterolateral infrarenal aortic segment is unchanged grossly since 02/21/2022 and appears to be penetrating ulceration on 5:44, and this was not seen in 2019. No abdominal, pelvic or inguinal adenopathy is seen. Reproductive: No prostatomegaly. Both testicles are in the scrotal sac. Other:  No free hemorrhage, free fluid, free air or incarcerated hernia. Small umbilical fat hernia. Small inguinal fat hernias. Musculoskeletal: Lumbar degenerative disc change and spondylosis L2-3 down, slight dextrorotary lumbar scoliosis. No acute or significant osseous findings. Severe acquired spinal stenosis L4-5. Review of the MIP images confirms the above findings. IMPRESSION: 1. No evidence of arterial embolus through the segmental arteries. Subsegmental arteries are largely obscured by respiratory motion. 2. Cardiomegaly without evidence of CHF. 3. 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 4. Layering fluid in the trachea consistent with retained secretions or aspirate. 5. COPD with bronchitis and scattered small bronchiolar subsegmental branch impactions in the lower lobes. No focal pneumonia. 6. Trace left pleural effusion new from prior studies. 7. Mildly distended gallbladder with some images suggesting mild wall thickening. Ultrasound follow-up recommended. No calcified stones are seen. 8. Possible wall thickening versus underdistention in the distal ascending and  transverse colon. Colitis not excluded. 9. Chronic gastric fold thickening. 10. Cystitis versus bladder nondistention with catheterized bladder. Correlate with urinalysis findings. 11. 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 12. Ulcerative plaque of the right anterolateral infrarenal aortic segment, unchanged since 02/21/2022 but not seen in 2019. No adjacent stranding or evidence of gross leakage. 13. Umbilical and inguinal fat hernias. 14. Severe acquired spinal stenosis L4-5. 15. 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 16. Emphysema. Aortic Atherosclerosis (ICD10-I70.0) and Emphysema (ICD10-J43.9). Electronically Signed   By: Almira Bar M.D.   On: 10/05/2022 03:18   CT ABDOMEN PELVIS W CONTRAST  Result Date: 10/05/2022 CLINICAL DATA:  Chest pain and suspected pulmonary embolism, abdominal pain and suspected bowel obstruction. EXAM: CT ANGIOGRAPHY CHEST CT ABDOMEN AND PELVIS WITH CONTRAST TECHNIQUE: Multidetector CT imaging of the chest was performed using the standard protocol during bolus administration of intravenous contrast. Multiplanar CT image reconstructions and MIPs were obtained to evaluate the vascular anatomy. Multidetector CT imaging of the abdomen and pelvis was performed using the standard protocol during bolus administration of intravenous contrast. RADIATION DOSE REDUCTION: This exam was performed according to the departmental dose-optimization program which includes automated exposure control, adjustment of the mA and/or kV according to patient size and/or use of iterative reconstruction technique. CONTRAST:  75mL OMNIPAQUE IOHEXOL 350 MG/ML SOLN COMPARISON:  Low-dose lung cancer screening chest CTs dated 06/27/2021 and 12/28/2019, CTA aortogram and iliofemoral runoff 02/21/2022, and CTA aortogram and  iliofemoral runoff 09/25/2019. Only the images are available from these studies. Reports are not in PACS at this time. FINDINGS: CTA CHEST FINDINGS Cardiovascular: The pulmonary trunk upper normal in caliber at 2.7 cm. No arterial embolism is seen through the segmental level. The subsegmental arteries are largely obscured on this study due to respiratory motion. The pulmonary veins are nondistended. Mild panchamber cardiomegaly is similar to the prior studies with heavy native CAD and old CABG changes. There are moderate calcific plaques in the great vessels, without significant focal stenosis, and moderate to heavy calcification in the thoracic aorta without aneurysm, dissection or stenosis. Mediastinum/Nodes: No thyroid or axillary mass. There are no enlarged intrathoracic lymph nodes. There is mild layering fluid in the trachea consistent with retained secretions or aspirate. Both main bronchi appear clear. Thoracic esophagus is unremarkable. Lungs/Pleura: There are mild features of centrilobular emphysema. Trace left pleural effusion from prior studies without pneumothorax. There is no right pleural effusion. There is linear scarring anteriorly in both upper  lobes. Diffuse bronchial thickening chronically noted but today is greater than previously. There are scattered small bronchiolar subsegmental branch impactions in the lower lobes but no consolidation or other acute pneumonic process. No evidence of pulmonary nodules. 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 No acute or other significant osseous findings or destructive lesions. Additional healed fractures elsewhere in the left rib cage. There is moderate bilateral gynecomastia on the left-greater-than-right. The visualized chest wall is otherwise unremarkable. CT ABDOMEN and PELVIS FINDINGS Hepatobiliary: There are occasional calcified hepatic granulomas.  The liver is slightly steatotic, without mass enhancement. Gallbladder is mildly distended. No calcified gallstones seen but some images especially the reconstructed images suggest mild wall thickening. Consider ultrasound follow-up.  No biliary dilatation. Pancreas: The pancreas is somewhat short but this is probably on the basis of normal variant development. No mass or ductal dilatation are seen. Spleen: No abnormality. Adrenals/Urinary Tract: Areas of chronic cortical volume loss left kidney. No adrenal mass. There are a few too small to characterize hypodensities of the left kidney consistent with Bosniak 2 cysts. No follow-up imaging is recommended. There is no renal mass enhancement. There is no urinary stone or obstruction. Small extrarenal pelves are again noted. The bladder is catheterized and contracted but even so does appear somewhat thickened. This was the case on the last CT as well. Correlate with urinalysis findings. Stomach/Bowel: The stomach, as before demonstrates moderate fold thickening but no more than previously. The unopacified small bowel and appendix are unremarkable. There are opacities in the cecum most likely undigested medication. There is either wall thickening or changes of underdistention in the distal ascending and transverse colon. Rest of the colon wall is unremarkable. Vascular/Lymphatic: Extensive aortic and branch vessel atherosclerosis. There is a stent in the left external iliac artery. Likely calcific occlusion in both internal iliac arteries and both common iliac arteries. Proximal outflow arteries are heavily calcified and there is a partially visible right femoral artery bypass graft. Ulcerative plaque of the right anterolateral infrarenal aortic segment is unchanged grossly since 02/21/2022 and appears to be penetrating ulceration on 5:44, and this was not seen in 2019. No abdominal, pelvic or inguinal adenopathy is seen. Reproductive: No prostatomegaly. Both testicles  are in the scrotal sac. Other: No free hemorrhage, free fluid, free air or incarcerated hernia. Small umbilical fat hernia. Small inguinal fat hernias. Musculoskeletal: Lumbar degenerative disc change and spondylosis L2-3 down, slight dextrorotary lumbar scoliosis. No acute or significant osseous findings. Severe acquired spinal stenosis L4-5. Review of the MIP images confirms the above findings. IMPRESSION: 1. No evidence of arterial embolus through the segmental arteries. Subsegmental arteries are largely obscured by respiratory motion. 2. Cardiomegaly without evidence of CHF. 3. 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 4. Layering fluid in the trachea consistent with retained secretions or aspirate. 5. COPD with bronchitis and scattered small bronchiolar subsegmental branch impactions in the lower lobes. No focal pneumonia. 6. Trace left pleural effusion new from prior studies. 7. Mildly distended gallbladder with some images suggesting mild wall thickening. Ultrasound follow-up recommended. No calcified stones are seen. 8. Possible wall thickening versus underdistention in the distal ascending and transverse colon. Colitis not excluded. 9. Chronic gastric fold thickening. 10. Cystitis versus bladder nondistention with catheterized bladder. Correlate with urinalysis findings. 11. 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 12. Ulcerative plaque of the  right anterolateral infrarenal aortic segment, unchanged since 02/21/2022 but not seen in 2019. No adjacent stranding or evidence of gross leakage. 13. Umbilical and inguinal fat hernias. 14. Severe acquired spinal stenosis L4-5. 15. 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 16. Emphysema. Aortic Atherosclerosis (ICD10-I70.0) and Emphysema (ICD10-J43.9). Electronically  Signed   By: Almira Bar M.D.   On: 10/05/2022 03:18   DG Chest Port 1 View  Result Date: 10/04/2022 CLINICAL DATA:  Questionable sepsis EXAM: PORTABLE CHEST 1 VIEW COMPARISON:  Chest x-ray 01/12/2022 FINDINGS: Sternotomy wires are present. The heart size and mediastinal contours are within normal limits. Both lungs are clear. No acute osseous findings. IMPRESSION: No active disease. Electronically Signed   By: Darliss Cheney M.D.   On: 10/04/2022 23:59    EKG: Independently reviewed.  NSR with rate 82; nonspecific ST changes with no evidence of acute ischemia   Labs on Admission: I have personally reviewed the available labs and imaging studies at the time of the admission.  Pertinent labs:   wbc: 2.2,  hgb: 12.3,  AST: 96,  ALT: 49,  AP: 226,  INR: 1.4,  BNP: 333,  UA: moderate LE/ WBC >50,  many bacteria,  lactic acid: 2.2>2.1   Assessment and Plan: Principal Problem:   Sepsis with bacteremia Active Problems:   Tobacco abuse   Transaminitis   Chronic osteomyelitis (HCC)   COPD (chronic obstructive pulmonary disease) (HCC)   Essential hypertension   CAD (coronary artery disease)   PAD (peripheral artery disease) (HCC)   Iron deficiency anemia   Hypercholesteremia   Bacteremia   UTI (urinary tract infection)    Assessment and Plan: * Sepsis with bacteremia 72 year old male presenting with complaints of shortness of breath and cough found to be septic with E.coli bacteremia and unknown source possibly urine vs. Aspiration. Vs. GI source with ? Colitis on CT; however, no abdominal pain or diarrhea.  Also has hx of chronic OM  -obs to tele  -received broad spectrum abx in ED>zosyn.  -mild lactic acidosis, received IVF in ED, will do gentle time limited IVF in setting of CHF and stability  -BC as above  -UA appears infected, has chronic indwelling catheter. CTA shows possible aspiration  -urine culture pending  -no evidence of pneumonia -check PCT, RVP and  covid/flu/RSV  -aspiration precautions/SLP eval  -ID consulted    Tobacco abuse Nicotine patch   Transaminitis Query if he has viral illness vs. Sepsis related  No tylenol use and [place] with no acute findings [place]: mild gallbladder wall thickening with intraluminal sludge, no stones/fluid or murphy sign. No biliary dilatation. Mild hepatic steatosis.  Hepatitis panel wnl Repeat cmp today Check GGT Trend   Chronic osteomyelitis (HCC) Right trochanter: two stage II ulcers. One healed. 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 MRI 12/2021 with dx and Dr. Mardene Speak performed excision 02/15/2022 with OR cultures with MRSA. He was placed on daptomycin for 6 weeks to end on 04/09/22   COPD (chronic obstructive pulmonary disease) (HCC) No s/sx of exacerbation, no wheezing on exam.  Written for trellegy, but does not take this-will add back  Schuduled duo nebs  SABA prn   Essential hypertension Currently on irbesartan 300mg  daily, amlodpine 5mg  daily, carvedilol 25 mg  twice a day,   CAD (coronary artery disease) S/p CABG  Troponin wnl Continue medical management with lipitor, coreg and ASA   PAD (peripheral artery disease) (HCC) status post stent placement with angioplasty of the left lower extremity in September 2001 and on Xarelto sees vascular surgery Dr. Everardo Beals, no claudication.  -continue statin and xarelto and ASA   Iron deficiency anemia Continue daily iron Hgb stable.   Hypercholesteremia Continue lipitor 80mg  since LFT <3x upper limits of normal    Advance Care Planning:   Code Status: Full Code   Consults: SLP  DVT Prophylaxis: xarelto   Family Communication: daughter   Severity of Illness: The appropriate patient status for this patient is OBSERVATION. Observation status is judged to be  reasonable and necessary in order to provide the required intensity of service to ensure the patient's safety. The patient's presenting symptoms, physical exam findings, and initial radiographic and laboratory data in the context of their medical condition is felt to place them at decreased risk for further clinical deterioration. Furthermore, it is anticipated that the patient will be medically stable for discharge from the hospital within 2 midnights of admission.   Author: Orland Mustard, MD 10/05/2022 6:50 PM  For on call review [email or web address].

## 2022-10-05 NOTE — Progress Notes (Signed)
PHARMACY - PHYSICIAN COMMUNICATION CRITICAL VALUE ALERT - BLOOD CULTURE IDENTIFICATION (BCID)  Caleb [name] is an 72 y.o. male who presented to [facility] on 10/04/2022 with a chief complaint of SOB and flu like symptoms, complicated by sepsis.   Assessment:  2 of 2 anaerobic bottles positive for GNR and GPC. BCID for E.coli blood stream infection. Possible source unknown. No resistance.   Name of physician (or Provider) Contacted: Orland Mustard, MD   Current antibiotics: Zosyn IV 3.375g Q8H  Changes to prescribed antibiotics recommended:  No changes indicated at this time. Recommendation accepted by provider.     Results for orders placed or performed during the hospital encounter of 10/04/22  Blood Culture ID Panel (Reflexed) (Collected: 10/04/2022 11:19 PM)  Result Value Ref Range   Enterococcus faecalis NOT DETECTED NOT DETECTED   Enterococcus Faecium NOT DETECTED NOT DETECTED   Listeria monocytogenes NOT DETECTED NOT DETECTED   Staphylococcus species NOT DETECTED NOT DETECTED   Staphylococcus aureus (BCID) NOT DETECTED NOT DETECTED   Staphylococcus epidermidis NOT DETECTED NOT DETECTED   Staphylococcus lugdunensis NOT DETECTED NOT DETECTED   Streptococcus species NOT DETECTED NOT DETECTED   Streptococcus agalactiae NOT DETECTED NOT DETECTED   Streptococcus pneumoniae NOT DETECTED NOT DETECTED   Streptococcus pyogenes NOT DETECTED NOT DETECTED   A.calcoaceticus-baumannii NOT DETECTED NOT DETECTED   Bacteroides fragilis NOT DETECTED NOT DETECTED   Enterobacterales DETECTED (A) NOT DETECTED   Enterobacter cloacae complex NOT DETECTED NOT DETECTED   Escherichia coli DETECTED (A) NOT DETECTED   Klebsiella aerogenes NOT DETECTED NOT DETECTED   Klebsiella oxytoca NOT DETECTED NOT DETECTED   Klebsiella pneumoniae NOT DETECTED NOT DETECTED   Proteus species NOT DETECTED NOT DETECTED   Salmonella species NOT DETECTED NOT DETECTED   Serratia marcescens NOT DETECTED NOT DETECTED    Haemophilus influenzae NOT DETECTED NOT DETECTED   Neisseria meningitidis NOT DETECTED NOT DETECTED   Pseudomonas aeruginosa NOT DETECTED NOT DETECTED   Stenotrophomonas maltophilia NOT DETECTED NOT DETECTED   Candida albicans NOT DETECTED NOT DETECTED   Candida auris NOT DETECTED NOT DETECTED   Candida glabrata NOT DETECTED NOT DETECTED   Candida krusei NOT DETECTED NOT DETECTED   Candida parapsilosis NOT DETECTED NOT DETECTED   Candida tropicalis NOT DETECTED NOT DETECTED   Cryptococcus neoformans/gattii NOT DETECTED NOT DETECTED   CTX-M ESBL NOT DETECTED NOT DETECTED   Carbapenem resistance IMP NOT DETECTED NOT DETECTED   Carbapenem resistance KPC NOT DETECTED NOT DETECTED   Carbapenem resistance NDM NOT DETECTED NOT DETECTED   Carbapenem resist OXA 48 LIKE NOT DETECTED NOT DETECTED   Carbapenem resistance VIM NOT DETECTED NOT DETECTED    Caleb [name] 10/05/2022  11:06 AM

## 2022-10-05 NOTE — Assessment & Plan Note (Addendum)
Query if he has viral illness vs. Sepsis related  No tylenol use and [place] with no acute findings [place]: mild gallbladder wall thickening with intraluminal sludge, no stones/fluid or murphy sign. No biliary dilatation. Mild hepatic steatosis.  Hepatitis panel wnl Repeat cmp today Check GGT Trend

## 2022-10-05 NOTE — Progress Notes (Signed)
Pharmacy Antibiotic Note  Caleb [name] is a 72 y.o. male admitted on 10/04/2022 with sepsis 2/2 UTI vs PNA. Vancomycin, cefepime, and flagyl x 1 in ED. Pharmacy has been consulted for zosyn dosing.  Plan: Zosyn 3.375g q8h F/u renal function, length of therapy and narrow as able   Height: 5\' 3"  (160 cm) Weight: 66 kg (145 lb 8.1 oz) IBW/kg (Calculated) : 56.9  Temp (24hrs), Avg:100.6 F (38.1 C), Min:99.3 F (37.4 C), Max:101.9 F (38.8 C)  Recent Labs  Lab 10/04/22 2320 10/04/22 2334 10/04/22 2342 10/05/22 0144  WBC 2.2*  --   --   --   CREATININE 0.97  --   --   --   LATICACIDVEN  --  2.2* 2.1* 1.0    Estimated Creatinine Clearance: 54.6 mL/min (by C-G formula based on SCr of 0.97 mg/dL).    Allergies  Allergen Reactions   Bactrim [Sulfamethoxazole-Trimethoprim] Other (See Comments)    "Made potassium go high and sodium go low"   Lisinopril Cough and Other (See Comments)   Losartan Swelling    Pt's daughter stated it made pt's blood pressure and sodium too low, but pt has been tolerating irbesartan.   Pletal [Cilostazol] Swelling and Other (See Comments)    Site of swelling not recalled by the patient    Antimicrobials this admission: Vancomycin 9/20 x 1 Cefepime 9/20 x 1 Flagyl 9/20 x 1 Zosyn 9/20 >  Dose adjustments this admission:  Microbiology results: 9/20 Bcx: pending 9/20 Ucx: pending 9/20 resp panel: neg  Thank you for allowing pharmacy to be a part of this patient's care.  Marja Kays 10/05/2022 6:48 AM

## 2022-10-05 NOTE — Assessment & Plan Note (Addendum)
72 year old male presenting with complaints of shortness of breath and cough found to be septic with E.coli bacteremia and unknown source possibly urine vs. Aspiration. Vs. GI source with ? Colitis on CT; however, no abdominal pain or diarrhea.  Also has hx of chronic OM  -obs to tele  -received broad spectrum abx in ED>zosyn.  -mild lactic acidosis, received IVF in ED, will do gentle time limited IVF in setting of CHF and stability  -BC as above  -UA appears infected, has chronic indwelling catheter. CTA shows possible aspiration  -urine culture pending  -no evidence of pneumonia -check PCT, RVP and covid/flu/RSV  -aspiration precautions/SLP eval  -ID consulted

## 2022-10-05 NOTE — Assessment & Plan Note (Signed)
Currently on irbesartan 300mg  daily, amlodpine 5mg  daily, carvedilol 25 mg twice a day,

## 2022-10-06 ENCOUNTER — Observation Stay (HOSPITAL_COMMUNITY): Payer: HMO

## 2022-10-06 ENCOUNTER — Other Ambulatory Visit (HOSPITAL_COMMUNITY): Payer: Self-pay

## 2022-10-06 DIAGNOSIS — A4151 Sepsis due to Escherichia coli [E. coli]: Secondary | ICD-10-CM | POA: Diagnosis not present

## 2022-10-06 DIAGNOSIS — I739 Peripheral vascular disease, unspecified: Secondary | ICD-10-CM | POA: Diagnosis present

## 2022-10-06 DIAGNOSIS — I110 Hypertensive heart disease with heart failure: Secondary | ICD-10-CM | POA: Diagnosis present

## 2022-10-06 DIAGNOSIS — N3001 Acute cystitis with hematuria: Secondary | ICD-10-CM | POA: Diagnosis present

## 2022-10-06 DIAGNOSIS — B9689 Other specified bacterial agents as the cause of diseases classified elsewhere: Secondary | ICD-10-CM | POA: Diagnosis present

## 2022-10-06 DIAGNOSIS — E871 Hypo-osmolality and hyponatremia: Secondary | ICD-10-CM | POA: Diagnosis present

## 2022-10-06 DIAGNOSIS — Z955 Presence of coronary angioplasty implant and graft: Secondary | ICD-10-CM | POA: Diagnosis not present

## 2022-10-06 DIAGNOSIS — D696 Thrombocytopenia, unspecified: Secondary | ICD-10-CM | POA: Diagnosis present

## 2022-10-06 DIAGNOSIS — E872 Acidosis, unspecified: Secondary | ICD-10-CM | POA: Diagnosis present

## 2022-10-06 DIAGNOSIS — Z1152 Encounter for screening for COVID-19: Secondary | ICD-10-CM | POA: Diagnosis not present

## 2022-10-06 DIAGNOSIS — E7800 Pure hypercholesterolemia, unspecified: Secondary | ICD-10-CM | POA: Diagnosis present

## 2022-10-06 DIAGNOSIS — Z881 Allergy status to other antibiotic agents status: Secondary | ICD-10-CM | POA: Diagnosis not present

## 2022-10-06 DIAGNOSIS — Z951 Presence of aortocoronary bypass graft: Secondary | ICD-10-CM | POA: Diagnosis not present

## 2022-10-06 DIAGNOSIS — L97829 Non-pressure chronic ulcer of other part of left lower leg with unspecified severity: Secondary | ICD-10-CM | POA: Diagnosis present

## 2022-10-06 DIAGNOSIS — M8668 Other chronic osteomyelitis, other site: Secondary | ICD-10-CM | POA: Diagnosis present

## 2022-10-06 DIAGNOSIS — A419 Sepsis, unspecified organism: Secondary | ICD-10-CM | POA: Diagnosis present

## 2022-10-06 DIAGNOSIS — D509 Iron deficiency anemia, unspecified: Secondary | ICD-10-CM | POA: Diagnosis present

## 2022-10-06 DIAGNOSIS — J449 Chronic obstructive pulmonary disease, unspecified: Secondary | ICD-10-CM | POA: Diagnosis present

## 2022-10-06 DIAGNOSIS — Z79899 Other long term (current) drug therapy: Secondary | ICD-10-CM | POA: Diagnosis not present

## 2022-10-06 DIAGNOSIS — I5022 Chronic systolic (congestive) heart failure: Secondary | ICD-10-CM | POA: Diagnosis present

## 2022-10-06 DIAGNOSIS — J690 Pneumonitis due to inhalation of food and vomit: Secondary | ICD-10-CM | POA: Diagnosis present

## 2022-10-06 DIAGNOSIS — R5381 Other malaise: Secondary | ICD-10-CM | POA: Diagnosis present

## 2022-10-06 DIAGNOSIS — K760 Fatty (change of) liver, not elsewhere classified: Secondary | ICD-10-CM | POA: Diagnosis present

## 2022-10-06 DIAGNOSIS — E876 Hypokalemia: Secondary | ICD-10-CM | POA: Diagnosis not present

## 2022-10-06 DIAGNOSIS — F17210 Nicotine dependence, cigarettes, uncomplicated: Secondary | ICD-10-CM | POA: Diagnosis present

## 2022-10-06 LAB — CBC
HCT: 33.2 % — ABNORMAL LOW (ref 39.0–52.0)
Hemoglobin: 11.2 g/dL — ABNORMAL LOW (ref 13.0–17.0)
MCH: 29.2 pg (ref 26.0–34.0)
MCHC: 33.7 g/dL (ref 30.0–36.0)
MCV: 86.5 fL (ref 80.0–100.0)
Platelets: 96 10*3/uL — ABNORMAL LOW (ref 150–400)
RBC: 3.84 MIL/uL — ABNORMAL LOW (ref 4.22–5.81)
RDW: 14.1 % (ref 11.5–15.5)
WBC: 10.4 10*3/uL (ref 4.0–10.5)
nRBC: 0 % (ref 0.0–0.2)

## 2022-10-06 LAB — EXPECTORATED SPUTUM ASSESSMENT W GRAM STAIN, RFLX TO RESP C

## 2022-10-06 LAB — COMPREHENSIVE METABOLIC PANEL
ALT: 41 U/L (ref 0–44)
AST: 39 U/L (ref 15–41)
Albumin: 2.5 g/dL — ABNORMAL LOW (ref 3.5–5.0)
Alkaline Phosphatase: 161 U/L — ABNORMAL HIGH (ref 38–126)
Anion gap: 13 (ref 5–15)
BUN: 10 mg/dL (ref 8–23)
CO2: 21 mmol/L — ABNORMAL LOW (ref 22–32)
Calcium: 8.6 mg/dL — ABNORMAL LOW (ref 8.9–10.3)
Chloride: 98 mmol/L (ref 98–111)
Creatinine, Ser: 0.83 mg/dL (ref 0.61–1.24)
GFR, Estimated: >60 mL/min (ref >60)
Glucose, Bld: 91 mg/dL (ref 70–99)
Potassium: 3.3 mmol/L — ABNORMAL LOW (ref 3.5–5.1)
Sodium: 132 mmol/L — ABNORMAL LOW (ref 135–145)
Total Bilirubin: 1.9 mg/dL — ABNORMAL HIGH (ref 0.3–1.2)
Total Protein: 6.1 g/dL — ABNORMAL LOW (ref 6.5–8.1)

## 2022-10-06 LAB — URINE CULTURE

## 2022-10-06 LAB — PROCALCITONIN: Procalcitonin: 20.74 ng/mL

## 2022-10-06 MED ORDER — CHLORHEXIDINE GLUCONATE CLOTH 2 % EX PADS
6.0000 | MEDICATED_PAD | Freq: Every day | CUTANEOUS | Status: DC
  Administered 2022-10-06 – 2022-10-08 (×3): 6 via TOPICAL

## 2022-10-06 MED ORDER — POTASSIUM CHLORIDE CRYS ER 20 MEQ PO TBCR
40.0000 meq | EXTENDED_RELEASE_TABLET | Freq: Once | ORAL | Status: AC
  Administered 2022-10-06: 40 meq via ORAL
  Filled 2022-10-06: qty 2

## 2022-10-06 MED ORDER — FERROUS SULFATE 325 (65 FE) MG PO TABS
325.0000 mg | ORAL_TABLET | Freq: Every day | ORAL | Status: DC
  Administered 2022-10-07 – 2022-10-08 (×2): 325 mg via ORAL
  Filled 2022-10-06 (×2): qty 1

## 2022-10-06 MED ORDER — THIAMINE MONONITRATE 100 MG PO TABS
100.0000 mg | ORAL_TABLET | Freq: Every day | ORAL | Status: DC
  Administered 2022-10-06 – 2022-10-08 (×3): 100 mg via ORAL
  Filled 2022-10-06 (×3): qty 1

## 2022-10-06 MED ORDER — FOLIC ACID 1 MG PO TABS
1.0000 mg | ORAL_TABLET | Freq: Every day | ORAL | Status: DC
  Administered 2022-10-06 – 2022-10-08 (×3): 1 mg via ORAL
  Filled 2022-10-06 (×3): qty 1

## 2022-10-06 MED ORDER — ASPIRIN 81 MG PO CHEW
81.0000 mg | CHEWABLE_TABLET | Freq: Every day | ORAL | Status: DC
  Administered 2022-10-06 – 2022-10-08 (×3): 81 mg via ORAL
  Filled 2022-10-06 (×3): qty 1

## 2022-10-06 MED ORDER — TAMSULOSIN HCL 0.4 MG PO CAPS
0.4000 mg | ORAL_CAPSULE | Freq: Every day | ORAL | Status: DC
  Administered 2022-10-06 – 2022-10-08 (×3): 0.4 mg via ORAL
  Filled 2022-10-06 (×3): qty 1

## 2022-10-06 MED ORDER — AMLODIPINE BESYLATE 5 MG PO TABS
5.0000 mg | ORAL_TABLET | Freq: Every day | ORAL | Status: DC
  Administered 2022-10-06 – 2022-10-08 (×3): 5 mg via ORAL
  Filled 2022-10-06 (×3): qty 1

## 2022-10-06 MED ORDER — PANTOPRAZOLE SODIUM 40 MG PO TBEC
40.0000 mg | DELAYED_RELEASE_TABLET | Freq: Every day | ORAL | Status: DC
  Administered 2022-10-06 – 2022-10-08 (×3): 40 mg via ORAL
  Filled 2022-10-06 (×3): qty 1

## 2022-10-06 MED ORDER — SODIUM CHLORIDE 1 G PO TABS
1.0000 g | ORAL_TABLET | Freq: Two times a day (BID) | ORAL | Status: DC
  Administered 2022-10-06: 1 g via ORAL
  Filled 2022-10-06 (×2): qty 1

## 2022-10-06 NOTE — Plan of Care (Signed)
  Problem: Education: Goal: Knowledge of General Education information will improve Description: Including pain rating scale, medication(s)/side effects and non-pharmacologic comfort measures Outcome: Progressing   Problem: Health Behavior/Discharge Planning: Goal: Ability to manage health-related needs will improve Outcome: Progressing   Problem: Clinical Measurements: Goal: Ability to maintain clinical measurements within normal limits will improve Outcome: Progressing   Problem: Clinical Measurements: Goal: Will remain free from infection Outcome: Progressing   Problem: Clinical Measurements: Goal: Diagnostic test results will improve Outcome: Progressing   Problem: Clinical Measurements: Goal: Respiratory complications will improve Outcome: Progressing   Problem: Activity: Goal: Risk for activity intolerance will decrease Outcome: Progressing   Problem: Nutrition: Goal: Adequate nutrition will be maintained Outcome: Progressing   Problem: Safety: Goal: Ability to remain free from injury will improve Outcome: Progressing   Problem: Skin Integrity: Goal: Risk for impaired skin integrity will decrease Outcome: Progressing   Problem: Pain Managment: Goal: General experience of comfort will improve Outcome: Progressing

## 2022-10-06 NOTE — Progress Notes (Signed)
Expectorated sputum sent to lab

## 2022-10-06 NOTE — Evaluation (Signed)
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   LIMA to LAD, RIMA to RCA, SVG to OM1, EVH via right thigh   Tobacco abuse    Past Surgical History:  Past Surgical History:  Procedure Laterality Date   BIOPSY  10/31/2018   Procedure: BIOPSY;  Surgeon: Jeani Hawking, MD;  Location: [facility];  Service: Endoscopy;;   cataract surgery  Bilateral 12-2017 and 01-2018   COLONOSCOPY WITH PROPOFOL N/A 10/31/2018   Procedure: COLONOSCOPY WITH PROPOFOL;  Surgeon: Jeani Hawking, MD;  Location: [facility];  Service: Endoscopy;  Laterality: N/A;   COLONOSCOPY WITH PROPOFOL N/A 09/18/2019   Procedure: COLONOSCOPY WITH PROPOFOL;  Surgeon: Jeani Hawking, MD;  Location: [facility];  Service: Endoscopy;  Laterality: N/A;   COLONOSCOPY WITH PROPOFOL N/A 01/20/2021   Procedure: COLONOSCOPY WITH PROPOFOL;  Surgeon: Jeani Hawking, MD;  Location: [facility];  Service: Endoscopy;  Laterality: N/A;   COLONOSCOPY WITH PROPOFOL N/A 11/03/2021   Procedure: COLONOSCOPY WITH PROPOFOL;  Surgeon: Jeani Hawking, MD;  Location: [facility];  Service: Gastroenterology;  Laterality: N/A;   CORONARY ARTERY BYPASS GRAFT N/A 02/17/2014   Procedure: CORONARY ARTERY BYPASS GRAFTING (CABG);  Surgeon: Purcell Nails, MD;  Location: [facility] OR;  Service: Open Heart Surgery;  Laterality: N/A;  Times 3 using bilateral mammary arteries and endoscopically harvested right saphenous vein   ESOPHAGOGASTRODUODENOSCOPY (EGD)  WITH PROPOFOL N/A 10/31/2018   Procedure: ESOPHAGOGASTRODUODENOSCOPY (EGD) WITH PROPOFOL;  Surgeon: Jeani Hawking, MD;  Location: [facility];  Service: Endoscopy;  Laterality: N/A;   ESOPHAGOGASTRODUODENOSCOPY (EGD) WITH PROPOFOL N/A 09/18/2019   Procedure: ESOPHAGOGASTRODUODENOSCOPY (EGD) WITH PROPOFOL;  Surgeon: Jeani Hawking, MD;  Location: [facility];  Service: Endoscopy;  Laterality: N/A;   FEMORAL ARTERY - FEMORAL ARTERY BYPASS GRAFT      right femoral artery to below-knee popliteal artery bypass with PTFE and right first ray amputation 04/25/17   HEMOSTASIS CLIP PLACEMENT  10/31/2018   Procedure: HEMOSTASIS CLIP PLACEMENT;  Surgeon: Jeani Hawking, MD;  Location: [facility];  Service: Endoscopy;;   HEMOSTASIS CLIP PLACEMENT  09/18/2019   Procedure: HEMOSTASIS CLIP PLACEMENT;  Surgeon: Jeani Hawking, MD;  Location: [facility];  Service: Endoscopy;;   HEMOSTASIS CLIP PLACEMENT  01/20/2021   Procedure: HEMOSTASIS CLIP PLACEMENT;  Surgeon: Jeani Hawking, MD;  Location: [facility];  Service: Endoscopy;;   INTRAOPERATIVE TRANSESOPHAGEAL ECHOCARDIOGRAM N/A 02/17/2014   Procedure: INTRAOPERATIVE TRANSESOPHAGEAL ECHOCARDIOGRAM;  Surgeon: Purcell Nails, MD;  Location[facility];  Service: Open Heart Surgery;  Laterality: N/A;   LEFT HEART CATHETERIZATION WITH CORONARY ANGIOGRAM N/A 02/12/2014   Procedure: LEFT HEART CATHETERIZATION WITH CORONARY ANGIOGRAM;  Surgeon: Lesleigh Noe, MD;  Location: [facility] CATH LAB;  Service: Cardiovascular;  Laterality: N/A;   POLYPECTOMY  10/31/2018   Procedure: POLYPECTOMY;  Surgeon: Jeani Hawking, MD;  Location: [facility];  Service: Endoscopy;;   POLYPECTOMY  09/18/2019   Procedure: POLYPECTOMY;  Surgeon: Jeani Hawking, MD;  Location: [facility];  Service: Endoscopy;;   POLYPECTOMY  01/20/2021  Procedure: POLYPECTOMY;  Surgeon: Jeani Hawking, MD;  Location: [facility];  Service: Endoscopy;;   POLYPECTOMY  11/03/2021   Procedure: POLYPECTOMY;  Surgeon: Jeani Hawking, MD;  Location: [facility];  Service: Gastroenterology;;   Susa Day  09/18/2019   Procedure: Susa Day;  Surgeon: Jeani Hawking, MD;  Location: [facility];  Service: Endoscopy;;   SUBMUCOSAL INJECTION  09/18/2019   Procedure: SUBMUCOSAL INJECTION;  Surgeon: Jeani Hawking, MD;  Location: [facility];  Service: Endoscopy;;   SUBMUCOSAL LIFTING INJECTION  10/31/2018   Procedure: SUBMUCOSAL LIFTING INJECTION;  Surgeon: Jeani Hawking, MD;  Location: [facility];  Service: Endoscopy;;   TOE AMPUTATION Right    right great toe    HPI:  Caleb [name] is a 71 to male who presented to the emergency room with complaint of shortness of breath, poor oral intake. Admitted for sepsis with bacteremia. PMHx: hypertension, coronary disease status post CABG, peripheral artery disease status post right femoral to popliteal bypass and bilateral iliac stenting, chronic HFrEF, SIADH, COPD, cervical spinal stenosis with chronic pain, and left hip ulcer with trochanteric osteomyelitis and chronic indwelling urinary catheter    Assessment / Plan / Recommendation  Clinical Impression  Pt seen for skilled ST services to evaluate swallow function. Pt is currently on a regular/thin liquid diet and was assessed with thin liquid, puree, regular solids, and mixed consistencies (regular solids/thin liquid). The pts OME was [facility], the pt has top dentures in but does not use bottom dentures despite edentulism. The pt reports having some difficulty with chewing but reports no coughing or choking with meals at home but reduced appetite for the past two days. The pt consumed x3 cup sips and x3 straw sips x3 with thins independently in isolation with no overt s/sx of aspiration. The pt consumed x3 bites of puree with x1 small throat clearance. The pt consumed x2 bites of solids in isolation with minimal oral residue. The pt consumed regular solids and thin liquids x2 together to resemble a meal and had x2 immediate and  productive coughing. Given the pt's pulmonary status and s/sx of aspiration present, the pt would benefit from a Modified Barium Swallow Study to determine if aspiration is present. For now, safest diet recs remain regular (IDDSI 7)/thin liquid (IDDSI 0) with STRICT aspiration precautions (small bites and sips, alternation of solids and liquids, stay upright for all meals, and eat/drink slowly). SLP to f/u with MBS. SLP Visit Diagnosis: Dysphagia, unspecified (R13.10)    Aspiration Risk  Mild aspiration risk;Moderate aspiration risk    Diet Recommendation Regular;Thin liquid    Liquid Administration via: Cup;Straw Medication Administration: Whole meds with liquid Supervision: Patient able to self feed;Comment (May need assist for neuropathy, ask pt if he needs help) Compensations: Slow rate;Small sips/bites Postural Changes: Seated upright at 90 degrees;Remain upright for at least 30 minutes after po intake    Other  Recommendations Oral Care Recommendations: Oral care QID    Recommendations for follow up therapy are one component of a multi-disciplinary discharge planning process, led by the attending physician.  Recommendations may be updated based on patient status, additional functional criteria and insurance authorization.     Assistance Recommended at Discharge    Functional Status Assessment Patient has had a recent decline in their functional status and demonstrates the ability to make significant improvements in function in a reasonable and predictable amount of time.  Frequency and Duration min 2x/week  1 week       Prognosis Prognosis for improved oropharyngeal function:  Good      Swallow Study   General Date of Onset: 10/05/22 HPI: Caleb [name] is a 22 to male who presented to the emergency room with complaint of shortness of breath, poor oral intake. Admitted for sepsis with bacteremia. PMHx: hypertension, coronary disease status post CABG, peripheral artery disease status  post right femoral to popliteal bypass and bilateral iliac stenting, chronic HFrEF, SIADH, COPD, cervical spinal stenosis with chronic pain, and left hip ulcer with trochanteric osteomyelitis and chronic indwelling urinary catheter Type of Study: Bedside Swallow Evaluation Diet Prior to this Study: Regular Behavior/Cognition: Pleasant mood;Cooperative Oral Cavity Assessment: Within Functional Limits Oral Care Completed by SLP: No Oral Cavity - Dentition: Dentures, top;Edentulous (dentures on top, edentulous on bottom. pt reports not using bottom dentures.) Vision: Functional for self-feeding Self-Feeding Abilities: Able to feed self (Pt had mild difficulty due to neuropathy) Patient Positioning: Upright in bed Baseline Vocal Quality: Normal Volitional Cough: Strong Volitional Swallow: Able to elicit    Oral/Motor/Sensory Function Overall Oral Motor/Sensory Function: Within functional limits   Ice Chips     Thin Liquid Thin Liquid: Impaired Presentation: Self Fed;Cup;Straw Pharyngeal  Phase Impairments: Cough - Immediate    Nectar Thick     Honey Thick     Puree Puree: Impaired Presentation: Spoon;Self Fed Pharyngeal Phase Impairments: Throat Clearing - Immediate   Solid     Solid: Impaired Pharyngeal Phase Impairments: Cough - Immediate      Dione Housekeeper M.S. CCC-SLP

## 2022-10-06 NOTE — Plan of Care (Signed)

## 2022-10-06 NOTE — Procedures (Signed)
Modified Barium Swallow Study  Patient Details  Name: Caleb [name] MRN: [id number] Date of Birth: 1950-03-27  Today's Date: 10/06/2022  Modified Barium Swallow completed.  Full report located under Chart Review in the Imaging Section.  History of Present Illness Caleb [name] is a 26 to male who presented to the emergency room with complaint of shortness of breath, poor oral intake. Admitted for sepsis with bacteremia. PMHx: hypertension, coronary disease status post CABG, peripheral artery disease status post right femoral to popliteal bypass and bilateral iliac stenting, chronic HFrEF, SIADH, COPD, cervical spinal stenosis with chronic pain, and left hip ulcer with trochanteric osteomyelitis and chronic indwelling urinary catheter     Clinical Impression Patient presents with a mild pharyngeal phase dysphagia as per this modified barium swallow study. Flash penetration occured with larger sip of thin liquid barium but remained well above vocal cords and fully exited laryngeal vestibule. No aspiration observed with any of the tested barium consistencies. Swallow was initiated at level of vallecula with solids and honey thick liquids and at level of posterior laryngeal surface of the epiglottis with thin and nectar thick liquids. There was one instance of trace pharyngeal residuals in vallecular sinus and pyriform sinus, however full clearance observed with subsequent swallow. Patient exhibited a couple instances of coughing in absence of PO's and when fluoroscopy turned on, no barium observed in pharynx, laryngeal vestibule or trachea. SLP did observe an excresence that appeared to be in pharynx, below epiglottis but when patient placed in A-P view, it appeared to be outside of pharynx (see images). In addition, patient with prominent cricopharyngeal bar and appearance of structural changes of cervical spine. SLP is recommending that patient continue with PO diet of regular solids, thin liquids. No  further skilled SLP intervention warranted at this time.   Factors that may increase risk of adverse event in presence of aspiration [employer] 2021): Frail or deconditioned;Limited mobility;Poor general health and/or compromised immunity  Swallow Evaluation Recommendations Recommendations: PO diet PO Diet Recommendation: Regular;Thin liquids (Level 0) Liquid Administration via: Cup;Straw Medication Administration: Whole meds with liquid Supervision: Patient able to self-feed Swallowing strategies  : Slow rate;Small bites/sips Postural changes: Position pt fully upright for meals Oral care recommendations: Oral care BID (2x/day)    Angela Nevin, MA, CCC-SLP Speech Therapy

## 2022-10-06 NOTE — Progress Notes (Signed)
PROGRESS NOTE  Caleb [name]  WUJ:[id number] DOB: 1950/04/08 DOA: 10/04/2022 PCP: Mattie Marlin, DO   Brief Narrative: Patient is a 72 year old male with past medical history of hypertension, coronary disease status post CABG, peripheral artery disease status post right femoral to popliteal bypass and bilateral iliac stenting, chronic HFrEF, SIADH, COPD, cervical spinal stenosis with chronic pain, and left hip ulcer with trochanteric osteomyelitis and chronic indwelling urinary catheter who presented to the emergency room with complaint of shortness of breath, poor oral intake.  On presentation, he was febrile, blood pressure stable.  Lab work showed mildly elevated liver enzymes.  UA was suspicious for UTI.  CTA chest did not show any PE but showed cardiomegaly without evidence of CHF, mildly distended gallbladder with wall thickening, layering fluid in the trachea consistent with retained secretion, COPD with bronchitis features.  Ultrasound of the abdomen showed mild gallbladder thickening with intraluminal large but no formed stones, no pericholecystic fluid, no biliary dilatation.  Blood cultures now showing gram-negative rods.  Patient started on broad spectrum antibiotics.  ID consulted  Assessment & Plan:  Principal Problem:   Sepsis with bacteremia Active Problems:   Tobacco abuse   Transaminitis   Chronic osteomyelitis (HCC)   COPD (chronic obstructive pulmonary disease) (HCC)   Essential hypertension   CAD (coronary artery disease)   PAD (peripheral artery disease) (HCC)   Iron deficiency anemia   Hypercholesteremia   Bacteremia   UTI (urinary tract infection)   Gram-negative bacteremia/sepsis: Presented with shortness of breath, cough, fever.  Blood cultures showing gram-negative rods.  Source of bacteremia could be from urine versus aspiration versus GI source.  CT abdomen/pelvis also showed mild colonic wall thickening no history of abdominal pain or diarrhea.  Received  broad-spectrum antibiotics.  ID following.  Follow-up final blood and urine culture.  Elevated procalcitonin.  Afebrile this morning.  Currently on Zosyn.  Leukocytosis has resolved  Urinary tract infection: UA suspicious for UTI.  Follow-up urine culture.  Has chronic Foley catheter, recently exchanged  2 days ago prior to admission.  Urine culture showed multiple species.  He follows with urology.  Suspected aspiration pneumonia: Speech therapy consulted.  CXR did not show any clear pneumonia. Currently on 2 L of oxygen.  Will try to wean him  Chronic osteomyelitis of lower extremity: History of osteomyelitis of right trochanter with stage II ulcer.Appears to be healing, wound care consulted.  He was already treated with antibiotics and finished antibiotics course on 04/09/2022  Elevated liver enzymes: Most likely from sepsis.  Ultrasound of the bladder showed mild gallbladder wall thickening with intraluminal sludge, no stones/fluid or murphy sign. No biliary dilatation. Mild hepatic steatosis. Hepatitis panel wnl  Tobacco abuse: Smokes 12 cigarettes a day.  Counseled for cessation  COPD: No signs of exacerbation.  Continue bronchodilators as needed  Hypertension: Takes amlodipine, Coreg, irbesartan at home.  Amlodipine restarted.  Coronary artery disease: Status post CABG.  Denies any anginal symptoms.  On Lipitor, Coreg, aspirin at home.  Lipitor on hold due to elevated liver enzymes  Peripheral artery disease:status post stent placement with angioplasty of the left lower extremity in September 2001 and on Xarelto.Follows with  vascular surgery Dr. Everardo Beals, no claudication.  On statin, Xarelto and aspirin at home  Iron deficiency  anemia: Currently hemoglobin stable.  Continue iron supplementation  Hyperlipidemia: On Lipitor at home, currently on hold  Hypokalemia: Supplemented with potassium  Deconditioning/debility: Patient lives alone.  Daughter checks on him.  He will email  [place] with  the help of walker.  Very weak and deconditioned.  Will consult PT/OT         DVT prophylaxis: rivaroxaban (XARELTO) tablet 2.5 mg     Code Status: Full Code  Family Communication: called and discussed with daughter Marylene Land on phone on 9/21  Patient status:Obs  Patient is from :Home  Anticipated discharge ZO:XWRU  Estimated DC date:2-3 days   Consultants: ID  Procedures: None   Antimicrobials:  Anti-infectives (From admission, onward)    Start     Dose/Rate Route Frequency Ordered Stop   10/05/22 0800  piperacillin-tazobactam (ZOSYN) IVPB 3.375 g        3.375 g 12.5 mL/hr over 240 Minutes Intravenous Every 8 hours 10/05/22 0710     10/05/22 0045  ceFEPIme (MAXIPIME) 2 g in sodium chloride 0.9 % 100 mL IVPB        2 g 200 mL/hr over 30 Minutes Intravenous  Once 10/05/22 0036 10/05/22 0126   10/05/22 0045  metroNIDAZOLE (FLAGYL) IVPB 500 mg        500 mg 100 mL/hr over 60 Minutes Intravenous  Once 10/05/22 0036 10/05/22 0303   10/05/22 0045  vancomycin (VANCOCIN) IVPB 1000 mg/200 mL premix  Status:  Discontinued        1,000 mg 200 mL/hr over 60 Minutes Intravenous  Once 10/05/22 0036 10/05/22 0037   10/05/22 0045  vancomycin (VANCOREADY) IVPB 1250 mg/250 mL        1,250 mg 166.7 mL/hr over 90 Minutes Intravenous  Once 10/05/22 0037 10/05/22 0436       Subjective: Patient seen and examined the bedside today.  Hemodynamically stable.  On 2 L of oxygen per minute.  Looks very weak and deconditioned.  Denies any abdomen pain, nausea or vomiting.  Denies any shortness of breath.  Objective: Vitals:   10/06/22 0047 10/06/22 0421 10/06/22 0740 10/06/22 0806  BP: (!) 154/63 (!) 161/53 (!) 163/50   Pulse: 62 65 66   Resp: 18 17    Temp: 98.5 F (36.9 C) 98.6 F (37 C) 98.7 F (37.1 C)   TempSrc: Oral  Oral   SpO2: 95% 98% 97% 98%  Weight:      Height:        Intake/Output Summary (Last 24 hours) at 10/06/2022 0811 Last data filed at 10/06/2022 0425 Gross per  24 hour  Intake --  Output 2550 ml  Net -2550 ml   Filed Weights   10/04/22 2256  Weight: 66 kg    Examination:  General exam: Very weak and deconditioned HEENT: PERRL Respiratory system:  no wheezes or crackles , mildly diminished sounds bilaterally Cardiovascular system: S1 & S2 heard, RRR.  Gastrointestinal system: Abdomen is nondistended, soft and nontender. Central nervous system: Alert and oriented Extremities: No edema, no clubbing ,no cyanosis Skin: Ulcer on the left hip GU: Foley catheter   Data Reviewed: I have personally reviewed following labs and imaging studies  CBC: Recent Labs  Lab 10/04/22 2320 10/04/22 2341 10/05/22 1406 10/06/22 0723  WBC 2.2*  --  13.5* 10.4  NEUTROABS 2.1  --  12.0*  --   HGB 12.3* 13.3 11.4* 11.2*  HCT 38.7* 39.0 34.7* 33.2*  MCV 88.4  --  89.0 86.5  PLT 125*  --  112* 96*   Basic Metabolic Panel: Recent Labs  Lab 10/04/22 2320 10/04/22 2341 10/05/22 1406  NA 137 135 135  K 4.1 4.1 4.2  CL 98  --  104  CO2  24  --  24  GLUCOSE 139*  --  89  BUN 14  --  9  CREATININE 0.97  --  0.90  CALCIUM 9.3  --  8.3*     Recent Results (from the past 240 hour(s))  Blood Culture (routine x 2)     Status: None (Preliminary result)   Collection Time: 10/04/22 11:19 PM   Specimen: BLOOD LEFT ARM  Result Value Ref Range Status   Specimen Description BLOOD LEFT ARM  Final   Special Requests   Final    BOTTLES DRAWN AEROBIC AND ANAEROBIC Blood Culture results may not be optimal due to an excessive volume of blood received in culture bottles   Culture  Setup Time   Final    GRAM POSITIVE RODS ANAEROBIC BOTTLE ONLY CRITICAL RESULT CALLED TO, READ BACK BY AND VERIFIED WITH: PHARMD UTOMWEN, A. [id number] FCP GRAM NEGATIVE RODS IN BOTH AEROBIC AND ANAEROBIC BOTTLES Performed at [facility], [address], [place], [place] [address]    Culture GRAM NEGATIVE RODS GRAM POSITIVE RODS   Final   Report Status PENDING   Incomplete  Resp panel by RT-PCR (RSV, Flu A&B, Covid) Urine, Catheterized     Status: None   Collection Time: 10/04/22 11:19 PM   Specimen: Urine, Catheterized; Nasal Swab  Result Value Ref Range Status   SARS Coronavirus 2 by RT PCR NEGATIVE NEGATIVE Final   Influenza A by PCR NEGATIVE NEGATIVE Final   Influenza B by PCR NEGATIVE NEGATIVE Final    Comment: (NOTE) The Xpert Xpress SARS-CoV-2/FLU/RSV plus assay is intended as an aid in the diagnosis of influenza from Nasopharyngeal swab specimens and should not be used as a sole basis for treatment. Nasal washings and aspirates are unacceptable for Xpert Xpress SARS-CoV-2/FLU/RSV testing.  Fact Sheet for Patients: BloggerCourse.com  Fact Sheet for Healthcare Providers: SeriousBroker.it  This test is not yet approved or cleared by the [place] FDA and has been authorized for detection and/or diagnosis of SARS-CoV-2 by FDA under an Emergency Use Authorization (EUA). This EUA will remain in effect (meaning this test can be used) for the duration of the COVID-19 declaration under Section 564(b)(1) of the Act, 21 U.S.C. section 360bbb-3(b)(1), unless the authorization is terminated or revoked.     Resp Syncytial Virus by PCR NEGATIVE NEGATIVE Final    Comment: (NOTE) Fact Sheet for Patients: BloggerCourse.com  Fact Sheet for Healthcare Providers: SeriousBroker.it  This test is not yet approved or cleared by the [place] FDA and has been authorized for detection and/or diagnosis of SARS-CoV-2 by FDA under an Emergency Use Authorization (EUA). This EUA will remain in effect (meaning this test can be used) for the duration of the COVID-19 declaration under Section 564(b)(1) of the Act, 21 U.S.C. section 360bbb-3(b)(1), unless the authorization is terminated or revoked.  Performed at [facility], [address] [place], [place] [address]   Blood Culture ID Panel (Reflexed)     Status: Abnormal   Collection Time: 10/04/22 11:19 PM  Result Value Ref Range Status   Enterococcus faecalis NOT DETECTED NOT DETECTED Final   Enterococcus Faecium NOT DETECTED NOT DETECTED Final   Listeria monocytogenes NOT DETECTED NOT DETECTED Final   Staphylococcus species NOT DETECTED NOT DETECTED Final   Staphylococcus aureus (BCID) NOT DETECTED NOT DETECTED Final   Staphylococcus epidermidis NOT DETECTED NOT DETECTED Final   Staphylococcus lugdunensis NOT DETECTED NOT DETECTED Final   Streptococcus species NOT DETECTED NOT DETECTED  Final   Streptococcus agalactiae NOT DETECTED NOT DETECTED Final   Streptococcus pneumoniae NOT DETECTED NOT DETECTED Final   Streptococcus pyogenes NOT DETECTED NOT DETECTED Final   A.calcoaceticus-baumannii NOT DETECTED NOT DETECTED Final   Bacteroides fragilis NOT DETECTED NOT DETECTED Final   Enterobacterales DETECTED (A) NOT DETECTED Final    Comment: Enterobacterales represent a large order of gram negative bacteria, not a single organism. CRITICAL RESULT CALLED TO, READ BACK BY AND VERIFIED WITH: PHARMD UTOMWEN, A. [id number] FCP    Enterobacter cloacae complex NOT DETECTED NOT DETECTED Final   Escherichia coli DETECTED (A) NOT DETECTED Final    Comment: CRITICAL RESULT CALLED TO, READ BACK BY AND VERIFIED WITH: PHARMD UTOMWEN, A. [id number] FCP    Klebsiella aerogenes NOT DETECTED NOT DETECTED Final   Klebsiella oxytoca NOT DETECTED NOT DETECTED Final   Klebsiella pneumoniae NOT DETECTED NOT DETECTED Final   Proteus species NOT DETECTED NOT DETECTED Final   Salmonella species NOT DETECTED NOT DETECTED Final   Serratia marcescens NOT DETECTED NOT DETECTED Final   Haemophilus influenzae NOT DETECTED NOT DETECTED Final   Neisseria meningitidis NOT DETECTED NOT DETECTED Final   Pseudomonas aeruginosa NOT DETECTED NOT DETECTED Final   Stenotrophomonas maltophilia NOT  DETECTED NOT DETECTED Final   Candida albicans NOT DETECTED NOT DETECTED Final   Candida auris NOT DETECTED NOT DETECTED Final   Candida glabrata NOT DETECTED NOT DETECTED Final   Candida krusei NOT DETECTED NOT DETECTED Final   Candida parapsilosis NOT DETECTED NOT DETECTED Final   Candida tropicalis NOT DETECTED NOT DETECTED Final   Cryptococcus neoformans/gattii NOT DETECTED NOT DETECTED Final   CTX-M ESBL NOT DETECTED NOT DETECTED Final   Carbapenem resistance IMP NOT DETECTED NOT DETECTED Final   Carbapenem resistance KPC NOT DETECTED NOT DETECTED Final   Carbapenem resistance NDM NOT DETECTED NOT DETECTED Final   Carbapenem resist OXA 48 LIKE NOT DETECTED NOT DETECTED Final   Carbapenem resistance VIM NOT DETECTED NOT DETECTED Final    Comment: Performed at [facility], [address], [place], [place] [address]  Blood Culture (routine x 2)     Status: None (Preliminary result)   Collection Time: 10/05/22 12:28 AM   Specimen: BLOOD RIGHT HAND  Result Value Ref Range Status   Specimen Description BLOOD RIGHT HAND  Final   Special Requests   Final    BOTTLES DRAWN AEROBIC AND ANAEROBIC Blood Culture adequate volume   Culture  Setup Time   Final    GRAM NEGATIVE RODS GRAM POSITIVE RODS ANAEROBIC BOTTLE ONLY CRITICAL VALUE NOTED.  VALUE IS CONSISTENT WITH PREVIOUSLY REPORTED AND CALLED VALUE. Performed at [facility] [place] [facility], [address], [place], [place] [address]    Culture GRAM NEGATIVE RODS GRAM POSITIVE RODS   Final   Report Status PENDING  Incomplete  Respiratory (~20 pathogens) panel by PCR     Status: None   Collection Time: 10/05/22 11:05 AM   Specimen: Nasopharyngeal Swab; Respiratory  Result Value Ref Range Status   Adenovirus NOT DETECTED NOT DETECTED Final   Coronavirus 229E NOT DETECTED NOT DETECTED Final    Comment: (NOTE) The Coronavirus on the Respiratory Panel, DOES NOT test for the novel  Coronavirus (2019 nCoV)    Coronavirus HKU1 NOT DETECTED  NOT DETECTED Final   Coronavirus NL63 NOT DETECTED NOT DETECTED Final   Coronavirus OC43 NOT DETECTED NOT DETECTED Final   Metapneumovirus NOT DETECTED NOT DETECTED Final   Rhinovirus / Enterovirus NOT  DETECTED NOT DETECTED Final   Influenza A NOT DETECTED NOT DETECTED Final   Influenza B NOT DETECTED NOT DETECTED Final   Parainfluenza Virus 1 NOT DETECTED NOT DETECTED Final   Parainfluenza Virus 2 NOT DETECTED NOT DETECTED Final   Parainfluenza Virus 3 NOT DETECTED NOT DETECTED Final   Parainfluenza Virus 4 NOT DETECTED NOT DETECTED Final   Respiratory Syncytial Virus NOT DETECTED NOT DETECTED Final   Bordetella pertussis NOT DETECTED NOT DETECTED Final   Bordetella Parapertussis NOT DETECTED NOT DETECTED Final   Chlamydophila pneumoniae NOT DETECTED NOT DETECTED Final   Mycoplasma pneumoniae NOT DETECTED NOT DETECTED Final    Comment: Performed at [facility] [place], [address] [place], [place] [address]  SARS Coronavirus 2 by RT PCR (hospital order, performed in [facility] hospital lab) *cepheid single result test* Anterior Nasal Swab     Status: None   Collection Time: 10/05/22 11:05 AM   Specimen: Anterior Nasal Swab  Result Value Ref Range Status   SARS Coronavirus 2 by RT PCR NEGATIVE NEGATIVE Final    Comment: Performed at [facility], [address], [place], [place] [address]  Expectorated Sputum Assessment w Gram Stain, Rflx to Resp Cult     Status: None   Collection Time: 10/05/22 11:35 AM   Specimen: Nasopharyngeal Swab; Sputum  Result Value Ref Range Status   Specimen Description EXPECTORATED SPUTUM  Final   Special Requests NONE  Final   Sputum evaluation   Final    Sputum specimen not acceptable for testing.  Please recollect.   Gram Stain Report Called to,Read Back By and Verified With: RN Trina Ao [phone] @ [phone] FH Performed at [facility], [address], [place], [place] [address]    Report Status 10/05/2022 FINAL  Final  Expectorated  Sputum Assessment w Gram Stain, Rflx to Resp Cult     Status: None   Collection Time: 10/05/22  6:01 PM   Specimen: Expectorated Sputum  Result Value Ref Range Status   Specimen Description EXPECTORATED SPUTUM  Final   Special Requests NONE  Final   Sputum evaluation   Final    Sputum specimen not acceptable for testing.  Please recollect.   NOTIFIED RN TRACEY BENNETT ON 10/05/22 @ 2208 BY DRT Performed at [facility], [address], [place], [place] [address]    Report Status 10/05/2022 FINAL  Final     Radiology Studies: DG CHEST PORT 1 VIEW  Result Date: 10/05/2022 CLINICAL DATA:  Hypoxia. EXAM: PORTABLE CHEST 1 VIEW COMPARISON:  Radiograph yesterday.  Chest CT earlier today FINDINGS: Median sternotomy. Stable cardiomegaly. Unchanged mediastinal contours. No focal airspace disease. Bronchial thickening. Small left pleural effusion on CT is not well seen by radiograph. No pneumothorax. IMPRESSION: 1. Unchanged from CT earlier today. 2. Stable cardiomegaly.  Unchanged bronchial thickening. 3. Small left pleural effusion on CT is not well seen by radiograph. Electronically Signed   By: Narda Rutherford M.D.   On: 10/05/2022 22:09   US Abdomen Limited  Result Date: 10/05/2022 CLINICAL DATA:  Suspected wall thickening of the gallbladder on CT. Right upper quadrant pain. EXAM: ULTRASOUND ABDOMEN LIMITED RIGHT UPPER QUADRANT COMPARISON:  Contemporaneous CT. Ultrasound complete abdomen 05/17/2017. FINDINGS: Gallbladder: There are no shadowing formed stones but there is a small volume of intraluminal layering echogenic sludge. There is no positive sonographic Murphy's sign but the free wall is slightly thickened measuring 4.2 mm, with no pericholecystic fluid. Common bile duct:  Diameter: 4.6 mm with no intrahepatic biliary dilatation. Liver: No focal lesion identified. The parenchyma is mildly hyperechoic consistent with steatosis. Portal vein is patent on color Doppler imaging with normal  direction of blood flow towards the liver. Other: None. IMPRESSION: 1. Mild gallbladder wall thickening with intraluminal sludge but no formed stones, pericholecystic fluid, or positive sonographic Murphy's sign. 2. No biliary dilatation. 3. Mild hepatic steatosis. Electronically Signed   By: Almira Bar M.D.   On: 10/05/2022 05:08   CT Angio Chest PE W and/or Wo Contrast  Result Date: 10/05/2022 CLINICAL DATA:  Chest pain and suspected pulmonary embolism, abdominal pain and suspected bowel obstruction. EXAM: CT ANGIOGRAPHY CHEST CT ABDOMEN AND PELVIS WITH CONTRAST TECHNIQUE: Multidetector CT imaging of the chest was performed using the standard protocol during bolus administration of intravenous contrast. Multiplanar CT image reconstructions and MIPs were obtained to evaluate the vascular anatomy. Multidetector CT imaging of the abdomen and pelvis was performed using the standard protocol during bolus administration of intravenous contrast. RADIATION DOSE REDUCTION: This exam was performed according to the departmental dose-optimization program which includes automated exposure control, adjustment of the mA and/or kV according to patient size and/or use of iterative reconstruction technique. CONTRAST:  75mL OMNIPAQUE IOHEXOL 350 MG/ML SOLN COMPARISON:  Low-dose lung cancer screening chest CTs dated 06/27/2021 and 12/28/2019, CTA aortogram and iliofemoral runoff 02/21/2022, and CTA aortogram and iliofemoral runoff 09/25/2019. Only the images are available from these studies. Reports are not in PACS at this time. FINDINGS: CTA CHEST FINDINGS Cardiovascular: The pulmonary trunk upper normal in caliber at 2.7 cm. No arterial embolism is seen through the segmental level. The subsegmental arteries are largely obscured on this study due to respiratory motion. The pulmonary veins are nondistended. Mild panchamber cardiomegaly is similar to the prior studies with heavy native CAD and old CABG changes. There are  moderate calcific plaques in the great vessels, without significant focal stenosis, and moderate to heavy calcification in the thoracic aorta without aneurysm, dissection or stenosis. Mediastinum/Nodes: No thyroid or axillary mass. There are no enlarged intrathoracic lymph nodes. There is mild layering fluid in the trachea consistent with retained secretions or aspirate. Both main bronchi appear clear. Thoracic esophagus is unremarkable. Lungs/Pleura: There are mild features of centrilobular emphysema. Trace left pleural effusion from prior studies without pneumothorax. There is no right pleural effusion. There is linear scarring anteriorly in both upper lobes. Diffuse bronchial thickening chronically noted but today is greater than previously. There are scattered small bronchiolar subsegmental branch impactions in the lower lobes but no consolidation or other acute pneumonic process. No evidence of pulmonary nodules. 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 No acute or other significant osseous findings or destructive lesions. Additional healed fractures elsewhere in the left rib cage. There is moderate bilateral gynecomastia on the left-greater-than-right. The visualized chest wall is otherwise unremarkable. CT ABDOMEN and PELVIS FINDINGS Hepatobiliary: There are occasional calcified hepatic granulomas. The liver is slightly steatotic, without mass enhancement. Gallbladder is mildly distended. No calcified gallstones seen but some images especially the reconstructed images suggest mild wall thickening. Consider ultrasound follow-up.  No biliary dilatation. Pancreas: The pancreas is somewhat short but this is probably on the basis of normal variant development. No mass or ductal dilatation are seen. Spleen: No abnormality. Adrenals/Urinary Tract: Areas of chronic cortical volume loss left kidney. No adrenal mass.  There are a few too small to characterize hypodensities of the left  kidney consistent with Bosniak 2 cysts. No follow-up imaging is recommended. There is no renal mass enhancement. There is no urinary stone or obstruction. Small extrarenal pelves are again noted. The bladder is catheterized and contracted but even so does appear somewhat thickened. This was the case on the last CT as well. Correlate with urinalysis findings. Stomach/Bowel: The stomach, as before demonstrates moderate fold thickening but no more than previously. The unopacified small bowel and appendix are unremarkable. There are opacities in the cecum most likely undigested medication. There is either wall thickening or changes of underdistention in the distal ascending and transverse colon. Rest of the colon wall is unremarkable. Vascular/Lymphatic: Extensive aortic and branch vessel atherosclerosis. There is a stent in the left external iliac artery. Likely calcific occlusion in both internal iliac arteries and both common iliac arteries. Proximal outflow arteries are heavily calcified and there is a partially visible right femoral artery bypass graft. Ulcerative plaque of the right anterolateral infrarenal aortic segment is unchanged grossly since 02/21/2022 and appears to be penetrating ulceration on 5:44, and this was not seen in 2019. No abdominal, pelvic or inguinal adenopathy is seen. Reproductive: No prostatomegaly. Both testicles are in the scrotal sac. Other: No free hemorrhage, free fluid, free air or incarcerated hernia. Small umbilical fat hernia. Small inguinal fat hernias. Musculoskeletal: Lumbar degenerative disc change and spondylosis L2-3 down, slight dextrorotary lumbar scoliosis. No acute or significant osseous findings. Severe acquired spinal stenosis L4-5. Review of the MIP images confirms the above findings. IMPRESSION: 1. No evidence of arterial embolus through the segmental arteries. Subsegmental arteries are largely  obscured by respiratory motion. 2. Cardiomegaly without evidence of CHF. 3. 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 4. Layering fluid in the trachea consistent with retained secretions or aspirate. 5. COPD with bronchitis and scattered small bronchiolar subsegmental branch impactions in the lower lobes. No focal pneumonia. 6. Trace left pleural effusion new from prior studies. 7. Mildly distended gallbladder with some images suggesting mild wall thickening. Ultrasound follow-up recommended. No calcified stones are seen. 8. Possible wall thickening versus underdistention in the distal ascending and transverse colon. Colitis not excluded. 9. Chronic gastric fold thickening. 10. Cystitis versus bladder nondistention with catheterized bladder. Correlate with urinalysis findings. 11. 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 12. Ulcerative plaque of the right anterolateral infrarenal aortic segment, unchanged since 02/21/2022 but not seen in 2019. No adjacent stranding or evidence of gross leakage. 13. Umbilical and inguinal fat hernias. 14. Severe acquired spinal stenosis L4-5. 15. 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 16. Emphysema. Aortic Atherosclerosis (ICD10-I70.0) and Emphysema (ICD10-J43.9). Electronically Signed   By: Almira Bar M.D.   On: 10/05/2022 03:18   CT ABDOMEN PELVIS W CONTRAST  Result Date: 10/05/2022 CLINICAL DATA:  Chest pain and suspected pulmonary embolism, abdominal pain and suspected bowel obstruction. EXAM: CT ANGIOGRAPHY CHEST CT ABDOMEN AND PELVIS WITH CONTRAST TECHNIQUE: Multidetector CT imaging of the chest was performed using the standard protocol during bolus administration of intravenous contrast. Multiplanar CT image reconstructions and MIPs were obtained to evaluate the vascular  anatomy. Multidetector CT imaging of the abdomen and pelvis was performed using the standard protocol during bolus administration of intravenous contrast. RADIATION DOSE REDUCTION: This exam was performed according to the departmental dose-optimization program which includes automated exposure control, adjustment of the mA and/or kV according to patient size and/or  use of iterative reconstruction technique. CONTRAST:  75mL OMNIPAQUE IOHEXOL 350 MG/ML SOLN COMPARISON:  Low-dose lung cancer screening chest CTs dated 06/27/2021 and 12/28/2019, CTA aortogram and iliofemoral runoff 02/21/2022, and CTA aortogram and iliofemoral runoff 09/25/2019. Only the images are available from these studies. Reports are not in PACS at this time. FINDINGS: CTA CHEST FINDINGS Cardiovascular: The pulmonary trunk upper normal in caliber at 2.7 cm. No arterial embolism is seen through the segmental level. The subsegmental arteries are largely obscured on this study due to respiratory motion. The pulmonary veins are nondistended. Mild panchamber cardiomegaly is similar to the prior studies with heavy native CAD and old CABG changes. There are moderate calcific plaques in the great vessels, without significant focal stenosis, and moderate to heavy calcification in the thoracic aorta without aneurysm, dissection or stenosis. Mediastinum/Nodes: No thyroid or axillary mass. There are no enlarged intrathoracic lymph nodes. There is mild layering fluid in the trachea consistent with retained secretions or aspirate. Both main bronchi appear clear. Thoracic esophagus is unremarkable. Lungs/Pleura: There are mild features of centrilobular emphysema. Trace left pleural effusion from prior studies without pneumothorax. There is no right pleural effusion. There is linear scarring anteriorly in both upper lobes. Diffuse bronchial thickening chronically noted but today is greater than previously. There are scattered small bronchiolar subsegmental branch  impactions in the lower lobes but no consolidation or other acute pneumonic process. No evidence of pulmonary nodules. 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 No acute or other significant osseous findings or destructive lesions. Additional healed fractures elsewhere in the left rib cage. There is moderate bilateral gynecomastia on the left-greater-than-right. The visualized chest wall is otherwise unremarkable. CT ABDOMEN and PELVIS FINDINGS Hepatobiliary: There are occasional calcified hepatic granulomas. The liver is slightly steatotic, without mass enhancement. Gallbladder is mildly distended. No calcified gallstones seen but some images especially the reconstructed images suggest mild wall thickening. Consider ultrasound follow-up.  No biliary dilatation. Pancreas: The pancreas is somewhat short but this is probably on the basis of normal variant development. No mass or ductal dilatation are seen. Spleen: No abnormality. Adrenals/Urinary Tract: Areas of chronic cortical volume loss left kidney. No adrenal mass. There are a few too small to characterize hypodensities of the left kidney consistent with Bosniak 2 cysts. No follow-up imaging is recommended. There is no renal mass enhancement. There is no urinary stone or obstruction. Small extrarenal pelves are again noted. The bladder is catheterized and contracted but even so does appear somewhat thickened. This was the case on the last CT as well. Correlate with urinalysis findings. Stomach/Bowel: The stomach, as before demonstrates moderate fold thickening but no more than previously. The unopacified small bowel and appendix are unremarkable. There are opacities in the cecum most likely undigested medication. There is either wall thickening or changes of underdistention in the distal ascending and transverse colon. Rest of the colon wall is unremarkable.  Vascular/Lymphatic: Extensive aortic and branch vessel atherosclerosis. There is a stent in the left external iliac artery. Likely calcific occlusion in both internal iliac arteries and both common iliac arteries. Proximal outflow arteries are heavily calcified and there is a partially visible right femoral artery bypass graft. Ulcerative plaque of the right anterolateral infrarenal aortic segment is unchanged grossly since 02/21/2022 and appears to be penetrating ulceration on 5:44, and this was not seen in 2019. No abdominal, pelvic or inguinal adenopathy is seen. Reproductive: No prostatomegaly. Both testicles are in  the scrotal sac. Other: No free hemorrhage, free fluid, free air or incarcerated hernia. Small umbilical fat hernia. Small inguinal fat hernias. Musculoskeletal: Lumbar degenerative disc change and spondylosis L2-3 down, slight dextrorotary lumbar scoliosis. No acute or significant osseous findings. Severe acquired spinal stenosis L4-5. Review of the MIP images confirms the above findings. IMPRESSION: 1. No evidence of arterial embolus through the segmental arteries. Subsegmental arteries are largely obscured by respiratory motion. 2. Cardiomegaly without evidence of CHF. 3. 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 4. Layering fluid in the trachea consistent with retained secretions or aspirate. 5. COPD with bronchitis and scattered small bronchiolar subsegmental branch impactions in the lower lobes. No focal pneumonia. 6. Trace left pleural effusion new from prior studies. 7. Mildly distended gallbladder with some images suggesting mild wall thickening. Ultrasound follow-up recommended. No calcified stones are seen. 8. Possible wall thickening versus underdistention in the distal ascending and transverse colon. Colitis not excluded. 9. Chronic gastric fold thickening. 10. Cystitis versus bladder nondistention with catheterized bladder. Correlate with urinalysis findings. 11. 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 12. Ulcerative plaque of the right anterolateral infrarenal aortic segment, unchanged since 02/21/2022 but not seen in 2019. No adjacent stranding or evidence of gross leakage. 13. Umbilical and inguinal fat hernias. 14. Severe acquired spinal stenosis L4-5. 15. 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 16. Emphysema. Aortic Atherosclerosis (ICD10-I70.0) and Emphysema (ICD10-J43.9). Electronically Signed   By: Almira Bar M.D.   On: 10/05/2022 03:18   DG Chest Port 1 View  Result Date: 10/04/2022 CLINICAL DATA:  Questionable sepsis EXAM: PORTABLE CHEST 1 VIEW COMPARISON:  Chest x-ray 01/12/2022 FINDINGS: Sternotomy wires are present. The heart size and mediastinal contours are within normal limits. Both lungs are clear. No acute osseous findings. IMPRESSION: No active disease. Electronically Signed   By: Darliss Cheney M.D.   On: 10/04/2022 23:59    Scheduled Meds:  acetaminophen  650 mg Oral Daily   fluticasone furoate-vilanterol  1 puff Inhalation Daily   And   umeclidinium bromide  1 puff Inhalation Daily   ipratropium-albuterol  3 mL Nebulization Q6H   nicotine  14 mg Transdermal Daily   rivaroxaban  2.5 mg Oral BID   Continuous Infusions:  piperacillin-tazobactam (ZOSYN)  IV 3.375 g (10/05/22 2356)     LOS: 0 days   Burnadette Pop, MD [facility] P9/21/2024, 8:11 AM

## 2022-10-07 ENCOUNTER — Encounter (HOSPITAL_COMMUNITY): Payer: Self-pay | Admitting: Family Medicine

## 2022-10-07 ENCOUNTER — Other Ambulatory Visit: Payer: Self-pay

## 2022-10-07 DIAGNOSIS — A4151 Sepsis due to Escherichia coli [E. coli]: Secondary | ICD-10-CM | POA: Diagnosis not present

## 2022-10-07 LAB — BASIC METABOLIC PANEL
Anion gap: 9 (ref 5–15)
BUN: 6 mg/dL — ABNORMAL LOW (ref 8–23)
CO2: 24 mmol/L (ref 22–32)
Calcium: 8.4 mg/dL — ABNORMAL LOW (ref 8.9–10.3)
Chloride: 97 mmol/L — ABNORMAL LOW (ref 98–111)
Creatinine, Ser: 0.8 mg/dL (ref 0.61–1.24)
GFR, Estimated: >60 mL/min (ref >60)
Glucose, Bld: 135 mg/dL — ABNORMAL HIGH (ref 70–99)
Potassium: 3.4 mmol/L — ABNORMAL LOW (ref 3.5–5.1)
Sodium: 130 mmol/L — ABNORMAL LOW (ref 135–145)

## 2022-10-07 LAB — CULTURE, BLOOD (ROUTINE X 2)

## 2022-10-07 MED ORDER — GUAIFENESIN ER 600 MG PO TB12
600.0000 mg | ORAL_TABLET | Freq: Two times a day (BID) | ORAL | Status: DC
  Administered 2022-10-07 – 2022-10-08 (×4): 600 mg via ORAL
  Filled 2022-10-07 (×4): qty 1

## 2022-10-07 MED ORDER — AMOXICILLIN-POT CLAVULANATE 875-125 MG PO TABS: 1.0000 | ORAL_TABLET | Freq: Two times a day (BID) | ORAL | Status: DC

## 2022-10-07 MED ORDER — POTASSIUM CHLORIDE CRYS ER 20 MEQ PO TBCR
40.0000 meq | EXTENDED_RELEASE_TABLET | Freq: Once | ORAL | Status: AC
  Administered 2022-10-07: 40 meq via ORAL
  Filled 2022-10-07: qty 2

## 2022-10-07 MED ORDER — POTASSIUM CHLORIDE CRYS ER 20 MEQ PO TBCR: 40.0000 meq | EXTENDED_RELEASE_TABLET | Freq: Every day | ORAL | 0 refills | Status: DC

## 2022-10-07 MED ORDER — PIPERACILLIN-TAZOBACTAM 3.375 G IVPB
3.3750 g | Freq: Three times a day (TID) | INTRAVENOUS | Status: DC
  Filled 2022-10-07: qty 50

## 2022-10-07 MED ORDER — CARVEDILOL 12.5 MG PO TABS
12.5000 mg | ORAL_TABLET | Freq: Two times a day (BID) | ORAL | Status: DC
  Administered 2022-10-08: 12.5 mg via ORAL
  Filled 2022-10-07 (×2): qty 1

## 2022-10-07 MED ORDER — AMOXICILLIN-POT CLAVULANATE 875-125 MG PO TABS: 1.0000 | ORAL_TABLET | Freq: Two times a day (BID) | ORAL | 0 refills | Status: DC

## 2022-10-07 MED ORDER — AMOXICILLIN-POT CLAVULANATE 875-125 MG PO TABS
1.0000 | ORAL_TABLET | Freq: Two times a day (BID) | ORAL | Status: DC
  Administered 2022-10-07 – 2022-10-08 (×2): 1 via ORAL
  Filled 2022-10-07 (×2): qty 1

## 2022-10-07 NOTE — Progress Notes (Signed)
Pharmacy Antibiotic Note  Caleb [name] is a 72 y.o. male admitted on 10/04/2022 with bacteremia and sepsis.  Pharmacy has been consulted for Zosyn dosing.  Patient presented with increased shortness of breath and cough and was found to be septic with E. Coli bacteremia from an unknown source. Tmax was 101.9 on 9/19 and patient has been afebrile since. WBC have decreased from 13.5 to 10.4.   He was initiated on Zosyn on 9/20 but there were potential plans for discharge on 9/22. He was going to be transitioned to Augmentin at discharge but discharge was canceled. Pharmacy was consulted to dose Zosyn.   Blood cultures were originally positive for E. Coli and GPR, but the identification of the GPR was pending. 9/20 blood cultures now positive for E. Coli, C. Perfringens, and K. Pneumoniae. 9/19 Bcx positive for E. Coli and C. Perfringens. Sensitivities pending.   Plan: Zosyn 3.375g IV q8h (4 hour infusion). Monitor renal function Follow-up sensitivities and ability to tailor antibiotics as appropriate  Height: 5\' 3"  (160 cm) Weight: 68 kg (149 lb 14.6 oz) IBW/kg (Calculated) : 56.9  Temp (24hrs), Avg:98.4 F (36.9 C), Min:97.6 F (36.4 C), Max:99 F (37.2 C)  Recent Labs  Lab 10/04/22 2320 10/04/22 2334 10/04/22 2342 10/05/22 0144 10/05/22 0727 10/05/22 1406 10/06/22 0723 10/07/22 1025  WBC 2.2*  --   --   --   --  13.5* 10.4  --   CREATININE 0.97  --   --   --   --  0.90 0.83 0.80  LATICACIDVEN  --  2.2* 2.1* 1.0 1.4  --   --   --     Estimated Creatinine Clearance: 66.2 mL/min (by C-G formula based on SCr of 0.8 mg/dL).    Allergies  Allergen Reactions   Sulfamethoxazole-Trimethoprim Other (See Comments)    "Made potassium go high and sodium go low"   Lisinopril Cough and Other (See Comments)   Losartan Swelling    Pt's daughter stated it made pt's blood pressure and sodium too low, but pt has been tolerating irbesartan.   Pletal [Cilostazol] Swelling and Other (See  Comments)    Site of swelling not recalled by the patient    Antimicrobials this admission: Zosyn 9/20 >>   Microbiology results: 9/19 BCx: E. Coli (pan sensitive), C. Perfringens 9/20 Bcx: E. Coli (pan sensitive), C. Perfringens, Klebsiella pneumoniae (susceptibilities pending) 9/19 UCx: contaminated  9/20 Sputum: NG   Thank you for allowing pharmacy to be a part of this patient's care.  Lennie Muckle, PharmD PGY1 Pharmacy Resident 10/07/2022 2:39 PM

## 2022-10-07 NOTE — Progress Notes (Signed)
ID brief note  GPR on 9/19 and and 9/20 + clostridium perfringens-> ok to transition to Augmentin to complete 2 weeks of abx. Pt noted to be progressing well per my communication with primary Repeat blood Cx  F/U with pPCP ID will sign off

## 2022-10-07 NOTE — Evaluation (Signed)
Physical Therapy Evaluation Patient Details Name: Caleb [name] MRN: [id number] DOB: [birth year] Today's Date: 10/07/2022  History of Present Illness  Caleb [name] is a 38 to male who presented to the emergency room with complaint of shortness of breath, poor oral intake. Admitted for sepsis with bacteremia. PMHx: hypertension, coronary disease status post CABG, peripheral artery disease status post right femoral to popliteal bypass and bilateral iliac stenting, chronic HFrEF, SIADH, COPD, cervical spinal stenosis with chronic pain, and left hip ulcer with trochanteric osteomyelitis and chronic indwelling urinary catheter  Clinical Impression  Patient present with mild dependencies in gait and mobility, pretty close to baseline per patient.  Patient with h/o peripheral neuropathy which impacts gait and increases his risk for fall.  Patient ambulated today with supervision with no apparent loss of balance or knee buckling.  Feel patient can discharge home with assistance as he regains his strength.  May need HHPT if delayed in returning fully to baseline (already has [facility] RN involved).          If plan is discharge home, recommend the following: A little help with walking and/or transfers;A little help with bathing/dressing/bathroom;Assistance with cooking/housework   Can travel by private vehicle        Equipment Recommendations None recommended by PT  Recommendations for Other Services       Functional Status Assessment Patient has had a recent decline in their functional status and demonstrates the ability to make significant improvements in function in a reasonable and predictable amount of time.     Precautions / Restrictions Precautions Precautions: Fall Precaution Comments: long term cath Restrictions Weight Bearing Restrictions: No      Mobility  Bed Mobility               General bed mobility comments: pt sitting in recliner upon arrival    Transfers Overall transfer  level: Needs assistance Equipment used: Rolling walker (2 wheels) Transfers: Sit to/from Stand Sit to Stand: Supervision                Ambulation/Gait Ambulation/Gait assistance: Supervision Gait Distance (Feet): 75 Feet Assistive device: Rolling walker (2 wheels) Gait Pattern/deviations: Step-through pattern, Decreased stride length, Wide base of support Gait velocity: decreased     General Gait Details: no loss of balance during gait, no balance deficits noted, reliant on RW for balance due to peripheral neuropathy  Stairs            Wheelchair Mobility     Tilt Bed    Modified Rankin (Stroke Patients Only)       Balance Overall balance assessment: Needs assistance Sitting-balance support: No upper extremity supported Sitting balance-Leahy Scale: Good     Standing balance support: Bilateral upper extremity supported, Reliant on assistive device for balance, During functional activity Standing balance-Leahy Scale: Poor                               Pertinent Vitals/Pain Pain Assessment Pain Assessment: No/denies pain    Home Living Family/patient expects to be discharged to:: Private residence Living Arrangements: Alone Available Help at Discharge: Family;Available PRN/intermittently Type of Home: Mobile home Home Access: Ramped entrance       Home Layout: One level Home Equipment: Agricultural consultant (2 wheels);Cane - single point;Shower seat;Wheelchair - manual Additional Comments: dtr lives next door, she works from home and assists as needed    Prior Function Prior Level of Function : Independent/Modified  Independent             Mobility Comments: ambulates with RW, WC for longer community distances. denies recent falls ADLs Comments: mod I for most ADLs, dtr helps with showers if needed. Dtr completes IADLs; nursing comes in to address wound (1x/week) and catheter (1x/month)     Extremity/Trunk Assessment   Upper Extremity  Assessment Upper Extremity Assessment: Generalized weakness    Lower Extremity Assessment Lower Extremity Assessment: Generalized weakness    Cervical / Trunk Assessment Cervical / Trunk Assessment: Kyphotic  Communication   Communication Communication: No apparent difficulties  Cognition Arousal: Alert Behavior During Therapy: Flat affect Overall Cognitive Status: Within Functional Limits for tasks assessed                                 General Comments: no apparent cog deficits during session        General Comments General comments (skin integrity, edema, etc.): VSS on RA    Exercises     Assessment/Plan    PT Assessment All further PT needs can be met in the next venue of care  PT Problem List Decreased activity tolerance;Decreased mobility       PT Treatment Interventions      PT Goals (Current goals can be found in the Care Plan section)  Acute Rehab PT Goals Patient Stated Goal: go back home PT Goal Formulation: With patient Time For Goal Achievement: 10/14/22 Potential to Achieve Goals: Good    Frequency       Co-evaluation               AM-PAC PT "6 Clicks" Mobility  Outcome Measure Help needed turning from your back to your side while in a flat bed without using bedrails?: A Little Help needed moving from lying on your back to sitting on the side of a flat bed without using bedrails?: A Little Help needed moving to and from a bed to a chair (including a wheelchair)?: A Little Help needed standing up from a chair using your arms (e.g., wheelchair or bedside chair)?: A Little Help needed to walk in hospital room?: A Little Help needed climbing 3-5 steps with a railing? : A Little 6 Click Score: 18    End of Session Equipment Utilized During Treatment: Gait belt Activity Tolerance: Patient tolerated treatment well Patient left: in chair;with chair alarm set   PT Visit Diagnosis: Other abnormalities of gait and mobility  (R26.89);Muscle weakness (generalized) (M62.81)    Time: 5784-6962 PT Time Calculation (min) (ACUTE ONLY): 18 min   Charges:   PT Evaluation $PT Eval Moderate Complexity: 1 Mod   PT General Charges $$ ACUTE PT VISIT: 1 Visit         10/07/2022 Delray Alt, PT Acute Rehabilitation Services Office:  [phone]   Olivia Canter 10/07/2022, 12:06 PM

## 2022-10-07 NOTE — Progress Notes (Signed)
Cult     Status: None   Collection Time: 10/05/22  6:01 PM   Specimen: Expectorated Sputum  Result Value Ref Range Status   Specimen Description EXPECTORATED SPUTUM  Final   Special Requests NONE  Final   Sputum evaluation   Final    Sputum specimen not acceptable for testing.  Please recollect.   NOTIFIED RN TRACEY BENNETT ON 10/05/22 @ 2208 BY DRT Performed at [facility], [address] [place], [place]  [address]    Report Status 10/05/2022 FINAL  Final  Expectorated Sputum Assessment w Gram Stain, Rflx to Resp Cult     Status: None   Collection Time: 10/05/22 10:13 PM   Specimen: Sputum  Result Value Ref Range Status   Specimen Description SPUTUM  Final   Special Requests NONE  Final   Sputum evaluation   Final    Sputum specimen not acceptable for testing.  Please recollect.   Results Called to: RN Synetta Shadow [id number] 1817 BY Berline Chough, [place] Performed at [facility], [address], [place], [place] [address]    Report Status 10/06/2022 FINAL  Final     Radiology Studies: DG Swallowing Func-Speech Pathology  Result Date: 10/06/2022 Table formatting from the original result was not included. Images from the original result were not included. Modified Barium Swallow Study Patient Details Name: CECILIA FORDHAM MRN: [id number] Date of Birth: 1950-11-03 Today's Date: 10/06/2022 HPI/PMH: HPI: Garner Vanhaitsma is a 29 to male who presented to the emergency room with complaint of shortness of breath, poor oral intake. Admitted for sepsis with bacteremia. PMHx: hypertension, coronary disease status post CABG, peripheral artery disease status post right femoral to popliteal bypass and bilateral iliac stenting, chronic HFrEF, SIADH, COPD, cervical spinal stenosis with chronic pain, and left hip ulcer with trochanteric osteomyelitis and chronic indwelling urinary catheter Clinical Impression: Clinical Impression: Patient presents with a mild pharyngeal phase dysphagia as per this modified barium swallow study. Flash penetration occured with larger sip of thin liquid barium but remained well above vocal cords and fully exited laryngeal vestibule. No aspiration observed with any of the tested barium consistencies. Swallow was initiated at level of vallecula with solids and honey thick liquids and at level of posterior laryngeal surface of the epiglottis with thin and nectar thick liquids. There was one instance of trace  pharyngeal residuals in vallecular sinus and pyriform sinus, however full clearance observed with subsequent swallow. Patient exhibited a couple instances of coughing in absence of PO's and when fluoroscopy turned on, no barium observed in pharynx, laryngeal vestibule or trachea. SLP did observe an excresence that appeared to be in pharynx, below epiglottis but when patient placed in A-P view, it appeared to be outside of pharynx (see images). In addition, patient with prominent cricopharyngeal bar and appearance of structural changes of cervical spine. SLP is recommending that patient continue with PO diet of regular solids, thin liquids. No further skilled SLP intervention warranted at this time. Factors that may increase risk of adverse event in presence of aspiration Rubye Oaks & Clearance Coots 2021): Factors that may increase risk of adverse event in presence of aspiration Rubye Oaks & Clearance Coots 2021): Frail or deconditioned; Limited mobility; Poor general health and/or compromised immunity Recommendations/Plan: Swallowing Evaluation Recommendations Swallowing Evaluation Recommendations Recommendations: PO diet PO Diet Recommendation: Regular; Thin liquids (Level 0) Liquid Administration via: Cup; Straw Medication Administration: Whole meds with liquid Supervision: Patient able to self-feed Swallowing strategies  : Slow rate; Small bites/sips Postural changes:  PROGRESS NOTE  VARDAN TREICHLER  ZOX:[id number] DOB: [birth year] DOA: 10/04/2022 PCP: Mattie Marlin, DO   Brief Narrative: Patient is a 72 year old male with past medical history of hypertension, coronary disease status post CABG, peripheral artery disease status post right femoral to popliteal bypass and bilateral iliac stenting, chronic HFrEF, SIADH, COPD, cervical spinal stenosis with chronic pain, and left hip ulcer with trochanteric osteomyelitis and chronic indwelling urinary catheter who presented to the emergency room with complaint of shortness of breath, poor oral intake.  On presentation, he was febrile, blood pressure stable.  Lab work showed mildly elevated liver enzymes.  UA was suspicious for UTI.  CTA chest did not show any PE but showed cardiomegaly without evidence of CHF, mildly distended gallbladder with wall thickening, layering fluid in the trachea consistent with retained secretion, COPD with bronchitis features.  Ultrasound of the abdomen showed mild gallbladder thickening with intraluminal large but no formed stones, no pericholecystic fluid, no biliary dilatation.  Blood cultures now showing gram-negative rods.  Patient started on broad spectrum antibiotics.  ID consulted.  Waiting for PT evaluation.  Clinically improved  Assessment & Plan:  Principal Problem:   Sepsis with bacteremia Active Problems:   Tobacco abuse   Transaminitis   Chronic osteomyelitis (HCC)   COPD (chronic obstructive pulmonary disease) (HCC)   Essential hypertension   CAD (coronary artery disease)   PAD (peripheral artery disease) (HCC)   Iron deficiency anemia   Hypercholesteremia   Bacteremia   UTI (urinary tract infection)   Gram-negative bacteremia/sepsis: Presented with shortness of breath, cough, fever.  Blood cultures showing gram-negative rods.  Source of bacteremia could be from urine versus aspiration versus GI source.  CT abdomen/pelvis also showed mild colonic wall thickening no  history of abdominal pain or diarrhea.  Received broad-spectrum antibiotics.  ID was following.   Elevated procalcitonin.  Afebrile this morning.  Currently on Zosyn.  Leukocytosis has resolved.  Will likely change antibiotics to cefadroxil on discharge, plan for total of 7 days of treatment  Urinary tract infection: UA suspicious for UTI.  Follow-up urine culture.  Has chronic Foley catheter, recently exchanged  2 days ago prior to admission.  Urine culture showed multiple species.  He follows with urology.  Suspected aspiration pneumonia:   CXR did not show any clear pneumonia. Currently on room air.  Speech therapy evaluated the patient, did MBS, recommended regular diet with thin liquid  Chronic osteomyelitis of lower extremity: History of osteomyelitis of right trochanter with stage II ulcer.Appears to be healing, wound care consulted.  He was already treated with antibiotics and finished antibiotics course on 04/09/2022  Elevated liver enzymes: Most likely from sepsis.  Ultrasound of the bladder showed mild gallbladder wall thickening with intraluminal sludge, no stones/fluid or murphy sign. No biliary dilatation. Mild hepatic steatosis. Hepatitis panel wnl.  Liver function has normalized  Tobacco abuse: Smokes 12 cigarettes a day.  Counseled for cessation  COPD: No signs of exacerbation.  Continue bronchodilators as needed  Hypertension: Takes amlodipine, Coreg, irbesartan at home.  Amlodipine ,coreg restarted.  Coronary artery disease: Status post CABG.  Denies any anginal symptoms.  On Lipitor, Coreg, aspirin at home.  Lipitor on hold due to elevated liver enzymes  Peripheral artery disease:status post stent placement with angioplasty of the left lower extremity in September 2001 and on Xarelto.Follows with  vascular surgery Dr. Everardo Beals, no claudication.  On statin, Xarelto and aspirin at home  Iron deficiency  anemia: Currently hemoglobin stable.  Continue  PROGRESS NOTE  VARDAN TREICHLER  ZOX:[id number] DOB: [birth year] DOA: 10/04/2022 PCP: Mattie Marlin, DO   Brief Narrative: Patient is a 72 year old male with past medical history of hypertension, coronary disease status post CABG, peripheral artery disease status post right femoral to popliteal bypass and bilateral iliac stenting, chronic HFrEF, SIADH, COPD, cervical spinal stenosis with chronic pain, and left hip ulcer with trochanteric osteomyelitis and chronic indwelling urinary catheter who presented to the emergency room with complaint of shortness of breath, poor oral intake.  On presentation, he was febrile, blood pressure stable.  Lab work showed mildly elevated liver enzymes.  UA was suspicious for UTI.  CTA chest did not show any PE but showed cardiomegaly without evidence of CHF, mildly distended gallbladder with wall thickening, layering fluid in the trachea consistent with retained secretion, COPD with bronchitis features.  Ultrasound of the abdomen showed mild gallbladder thickening with intraluminal large but no formed stones, no pericholecystic fluid, no biliary dilatation.  Blood cultures now showing gram-negative rods.  Patient started on broad spectrum antibiotics.  ID consulted.  Waiting for PT evaluation.  Clinically improved  Assessment & Plan:  Principal Problem:   Sepsis with bacteremia Active Problems:   Tobacco abuse   Transaminitis   Chronic osteomyelitis (HCC)   COPD (chronic obstructive pulmonary disease) (HCC)   Essential hypertension   CAD (coronary artery disease)   PAD (peripheral artery disease) (HCC)   Iron deficiency anemia   Hypercholesteremia   Bacteremia   UTI (urinary tract infection)   Gram-negative bacteremia/sepsis: Presented with shortness of breath, cough, fever.  Blood cultures showing gram-negative rods.  Source of bacteremia could be from urine versus aspiration versus GI source.  CT abdomen/pelvis also showed mild colonic wall thickening no  history of abdominal pain or diarrhea.  Received broad-spectrum antibiotics.  ID was following.   Elevated procalcitonin.  Afebrile this morning.  Currently on Zosyn.  Leukocytosis has resolved.  Will likely change antibiotics to cefadroxil on discharge, plan for total of 7 days of treatment  Urinary tract infection: UA suspicious for UTI.  Follow-up urine culture.  Has chronic Foley catheter, recently exchanged  2 days ago prior to admission.  Urine culture showed multiple species.  He follows with urology.  Suspected aspiration pneumonia:   CXR did not show any clear pneumonia. Currently on room air.  Speech therapy evaluated the patient, did MBS, recommended regular diet with thin liquid  Chronic osteomyelitis of lower extremity: History of osteomyelitis of right trochanter with stage II ulcer.Appears to be healing, wound care consulted.  He was already treated with antibiotics and finished antibiotics course on 04/09/2022  Elevated liver enzymes: Most likely from sepsis.  Ultrasound of the bladder showed mild gallbladder wall thickening with intraluminal sludge, no stones/fluid or murphy sign. No biliary dilatation. Mild hepatic steatosis. Hepatitis panel wnl.  Liver function has normalized  Tobacco abuse: Smokes 12 cigarettes a day.  Counseled for cessation  COPD: No signs of exacerbation.  Continue bronchodilators as needed  Hypertension: Takes amlodipine, Coreg, irbesartan at home.  Amlodipine ,coreg restarted.  Coronary artery disease: Status post CABG.  Denies any anginal symptoms.  On Lipitor, Coreg, aspirin at home.  Lipitor on hold due to elevated liver enzymes  Peripheral artery disease:status post stent placement with angioplasty of the left lower extremity in September 2001 and on Xarelto.Follows with  vascular surgery Dr. Everardo Beals, no claudication.  On statin, Xarelto and aspirin at home  Iron deficiency  anemia: Currently hemoglobin stable.  Continue  Cult     Status: None   Collection Time: 10/05/22  6:01 PM   Specimen: Expectorated Sputum  Result Value Ref Range Status   Specimen Description EXPECTORATED SPUTUM  Final   Special Requests NONE  Final   Sputum evaluation   Final    Sputum specimen not acceptable for testing.  Please recollect.   NOTIFIED RN TRACEY BENNETT ON 10/05/22 @ 2208 BY DRT Performed at [facility], [address] [place], [place]  [address]    Report Status 10/05/2022 FINAL  Final  Expectorated Sputum Assessment w Gram Stain, Rflx to Resp Cult     Status: None   Collection Time: 10/05/22 10:13 PM   Specimen: Sputum  Result Value Ref Range Status   Specimen Description SPUTUM  Final   Special Requests NONE  Final   Sputum evaluation   Final    Sputum specimen not acceptable for testing.  Please recollect.   Results Called to: RN Synetta Shadow [id number] 1817 BY Berline Chough, [place] Performed at [facility], [address], [place], [place] [address]    Report Status 10/06/2022 FINAL  Final     Radiology Studies: DG Swallowing Func-Speech Pathology  Result Date: 10/06/2022 Table formatting from the original result was not included. Images from the original result were not included. Modified Barium Swallow Study Patient Details Name: CECILIA FORDHAM MRN: [id number] Date of Birth: 1950-11-03 Today's Date: 10/06/2022 HPI/PMH: HPI: Garner Vanhaitsma is a 29 to male who presented to the emergency room with complaint of shortness of breath, poor oral intake. Admitted for sepsis with bacteremia. PMHx: hypertension, coronary disease status post CABG, peripheral artery disease status post right femoral to popliteal bypass and bilateral iliac stenting, chronic HFrEF, SIADH, COPD, cervical spinal stenosis with chronic pain, and left hip ulcer with trochanteric osteomyelitis and chronic indwelling urinary catheter Clinical Impression: Clinical Impression: Patient presents with a mild pharyngeal phase dysphagia as per this modified barium swallow study. Flash penetration occured with larger sip of thin liquid barium but remained well above vocal cords and fully exited laryngeal vestibule. No aspiration observed with any of the tested barium consistencies. Swallow was initiated at level of vallecula with solids and honey thick liquids and at level of posterior laryngeal surface of the epiglottis with thin and nectar thick liquids. There was one instance of trace  pharyngeal residuals in vallecular sinus and pyriform sinus, however full clearance observed with subsequent swallow. Patient exhibited a couple instances of coughing in absence of PO's and when fluoroscopy turned on, no barium observed in pharynx, laryngeal vestibule or trachea. SLP did observe an excresence that appeared to be in pharynx, below epiglottis but when patient placed in A-P view, it appeared to be outside of pharynx (see images). In addition, patient with prominent cricopharyngeal bar and appearance of structural changes of cervical spine. SLP is recommending that patient continue with PO diet of regular solids, thin liquids. No further skilled SLP intervention warranted at this time. Factors that may increase risk of adverse event in presence of aspiration Rubye Oaks & Clearance Coots 2021): Factors that may increase risk of adverse event in presence of aspiration Rubye Oaks & Clearance Coots 2021): Frail or deconditioned; Limited mobility; Poor general health and/or compromised immunity Recommendations/Plan: Swallowing Evaluation Recommendations Swallowing Evaluation Recommendations Recommendations: PO diet PO Diet Recommendation: Regular; Thin liquids (Level 0) Liquid Administration via: Cup; Straw Medication Administration: Whole meds with liquid Supervision: Patient able to self-feed Swallowing strategies  : Slow rate; Small bites/sips Postural changes:  Cult     Status: None   Collection Time: 10/05/22  6:01 PM   Specimen: Expectorated Sputum  Result Value Ref Range Status   Specimen Description EXPECTORATED SPUTUM  Final   Special Requests NONE  Final   Sputum evaluation   Final    Sputum specimen not acceptable for testing.  Please recollect.   NOTIFIED RN TRACEY BENNETT ON 10/05/22 @ 2208 BY DRT Performed at [facility], [address] [place], [place]  [address]    Report Status 10/05/2022 FINAL  Final  Expectorated Sputum Assessment w Gram Stain, Rflx to Resp Cult     Status: None   Collection Time: 10/05/22 10:13 PM   Specimen: Sputum  Result Value Ref Range Status   Specimen Description SPUTUM  Final   Special Requests NONE  Final   Sputum evaluation   Final    Sputum specimen not acceptable for testing.  Please recollect.   Results Called to: RN Synetta Shadow [id number] 1817 BY Berline Chough, MT Performed at [facility], [address], [place], [place] [address]    Report Status 10/06/2022 FINAL  Final     Radiology Studies: DG Swallowing Func-Speech Pathology  Result Date: 10/06/2022 Table formatting from the original result was not included. Images from the original result were not included. Modified Barium Swallow Study Patient Details Name: CECILIA FORDHAM MRN: [id number] Date of Birth: 1950-11-03 Today's Date: 10/06/2022 HPI/PMH: HPI: Garner Vanhaitsma is a 29 to male who presented to the emergency room with complaint of shortness of breath, poor oral intake. Admitted for sepsis with bacteremia. PMHx: hypertension, coronary disease status post CABG, peripheral artery disease status post right femoral to popliteal bypass and bilateral iliac stenting, chronic HFrEF, SIADH, COPD, cervical spinal stenosis with chronic pain, and left hip ulcer with trochanteric osteomyelitis and chronic indwelling urinary catheter Clinical Impression: Clinical Impression: Patient presents with a mild pharyngeal phase dysphagia as per this modified barium swallow study. Flash penetration occured with larger sip of thin liquid barium but remained well above vocal cords and fully exited laryngeal vestibule. No aspiration observed with any of the tested barium consistencies. Swallow was initiated at level of vallecula with solids and honey thick liquids and at level of posterior laryngeal surface of the epiglottis with thin and nectar thick liquids. There was one instance of trace  pharyngeal residuals in vallecular sinus and pyriform sinus, however full clearance observed with subsequent swallow. Patient exhibited a couple instances of coughing in absence of PO's and when fluoroscopy turned on, no barium observed in pharynx, laryngeal vestibule or trachea. SLP did observe an excresence that appeared to be in pharynx, below epiglottis but when patient placed in A-P view, it appeared to be outside of pharynx (see images). In addition, patient with prominent cricopharyngeal bar and appearance of structural changes of cervical spine. SLP is recommending that patient continue with PO diet of regular solids, thin liquids. No further skilled SLP intervention warranted at this time. Factors that may increase risk of adverse event in presence of aspiration Rubye Oaks & Clearance Coots 2021): Factors that may increase risk of adverse event in presence of aspiration Rubye Oaks & Clearance Coots 2021): Frail or deconditioned; Limited mobility; Poor general health and/or compromised immunity Recommendations/Plan: Swallowing Evaluation Recommendations Swallowing Evaluation Recommendations Recommendations: PO diet PO Diet Recommendation: Regular; Thin liquids (Level 0) Liquid Administration via: Cup; Straw Medication Administration: Whole meds with liquid Supervision: Patient able to self-feed Swallowing strategies  : Slow rate; Small bites/sips Postural changes:  Cult     Status: None   Collection Time: 10/05/22  6:01 PM   Specimen: Expectorated Sputum  Result Value Ref Range Status   Specimen Description EXPECTORATED SPUTUM  Final   Special Requests NONE  Final   Sputum evaluation   Final    Sputum specimen not acceptable for testing.  Please recollect.   NOTIFIED RN TRACEY BENNETT ON 10/05/22 @ 2208 BY DRT Performed at [facility], [address] [place], [place]  [address]    Report Status 10/05/2022 FINAL  Final  Expectorated Sputum Assessment w Gram Stain, Rflx to Resp Cult     Status: None   Collection Time: 10/05/22 10:13 PM   Specimen: Sputum  Result Value Ref Range Status   Specimen Description SPUTUM  Final   Special Requests NONE  Final   Sputum evaluation   Final    Sputum specimen not acceptable for testing.  Please recollect.   Results Called to: RN Synetta Shadow [id number] 1817 BY Berline Chough, MT Performed at [facility], [address], [place], [place] [address]    Report Status 10/06/2022 FINAL  Final     Radiology Studies: DG Swallowing Func-Speech Pathology  Result Date: 10/06/2022 Table formatting from the original result was not included. Images from the original result were not included. Modified Barium Swallow Study Patient Details Name: CECILIA FORDHAM MRN: [id number] Date of Birth: 1950-11-03 Today's Date: 10/06/2022 HPI/PMH: HPI: Garner Vanhaitsma is a 29 to male who presented to the emergency room with complaint of shortness of breath, poor oral intake. Admitted for sepsis with bacteremia. PMHx: hypertension, coronary disease status post CABG, peripheral artery disease status post right femoral to popliteal bypass and bilateral iliac stenting, chronic HFrEF, SIADH, COPD, cervical spinal stenosis with chronic pain, and left hip ulcer with trochanteric osteomyelitis and chronic indwelling urinary catheter Clinical Impression: Clinical Impression: Patient presents with a mild pharyngeal phase dysphagia as per this modified barium swallow study. Flash penetration occured with larger sip of thin liquid barium but remained well above vocal cords and fully exited laryngeal vestibule. No aspiration observed with any of the tested barium consistencies. Swallow was initiated at level of vallecula with solids and honey thick liquids and at level of posterior laryngeal surface of the epiglottis with thin and nectar thick liquids. There was one instance of trace  pharyngeal residuals in vallecular sinus and pyriform sinus, however full clearance observed with subsequent swallow. Patient exhibited a couple instances of coughing in absence of PO's and when fluoroscopy turned on, no barium observed in pharynx, laryngeal vestibule or trachea. SLP did observe an excresence that appeared to be in pharynx, below epiglottis but when patient placed in A-P view, it appeared to be outside of pharynx (see images). In addition, patient with prominent cricopharyngeal bar and appearance of structural changes of cervical spine. SLP is recommending that patient continue with PO diet of regular solids, thin liquids. No further skilled SLP intervention warranted at this time. Factors that may increase risk of adverse event in presence of aspiration Rubye Oaks & Clearance Coots 2021): Factors that may increase risk of adverse event in presence of aspiration Rubye Oaks & Clearance Coots 2021): Frail or deconditioned; Limited mobility; Poor general health and/or compromised immunity Recommendations/Plan: Swallowing Evaluation Recommendations Swallowing Evaluation Recommendations Recommendations: PO diet PO Diet Recommendation: Regular; Thin liquids (Level 0) Liquid Administration via: Cup; Straw Medication Administration: Whole meds with liquid Supervision: Patient able to self-feed Swallowing strategies  : Slow rate; Small bites/sips Postural changes:  PROGRESS NOTE  VARDAN TREICHLER  ZOX:[id number] DOB: [birth year] DOA: 10/04/2022 PCP: Mattie Marlin, DO   Brief Narrative: Patient is a 72 year old male with past medical history of hypertension, coronary disease status post CABG, peripheral artery disease status post right femoral to popliteal bypass and bilateral iliac stenting, chronic HFrEF, SIADH, COPD, cervical spinal stenosis with chronic pain, and left hip ulcer with trochanteric osteomyelitis and chronic indwelling urinary catheter who presented to the emergency room with complaint of shortness of breath, poor oral intake.  On presentation, he was febrile, blood pressure stable.  Lab work showed mildly elevated liver enzymes.  UA was suspicious for UTI.  CTA chest did not show any PE but showed cardiomegaly without evidence of CHF, mildly distended gallbladder with wall thickening, layering fluid in the trachea consistent with retained secretion, COPD with bronchitis features.  Ultrasound of the abdomen showed mild gallbladder thickening with intraluminal large but no formed stones, no pericholecystic fluid, no biliary dilatation.  Blood cultures now showing gram-negative rods.  Patient started on broad spectrum antibiotics.  ID consulted.  Waiting for PT evaluation.  Clinically improved  Assessment & Plan:  Principal Problem:   Sepsis with bacteremia Active Problems:   Tobacco abuse   Transaminitis   Chronic osteomyelitis (HCC)   COPD (chronic obstructive pulmonary disease) (HCC)   Essential hypertension   CAD (coronary artery disease)   PAD (peripheral artery disease) (HCC)   Iron deficiency anemia   Hypercholesteremia   Bacteremia   UTI (urinary tract infection)   Gram-negative bacteremia/sepsis: Presented with shortness of breath, cough, fever.  Blood cultures showing gram-negative rods.  Source of bacteremia could be from urine versus aspiration versus GI source.  CT abdomen/pelvis also showed mild colonic wall thickening no  history of abdominal pain or diarrhea.  Received broad-spectrum antibiotics.  ID was following.   Elevated procalcitonin.  Afebrile this morning.  Currently on Zosyn.  Leukocytosis has resolved.  Will likely change antibiotics to cefadroxil on discharge, plan for total of 7 days of treatment  Urinary tract infection: UA suspicious for UTI.  Follow-up urine culture.  Has chronic Foley catheter, recently exchanged  2 days ago prior to admission.  Urine culture showed multiple species.  He follows with urology.  Suspected aspiration pneumonia:   CXR did not show any clear pneumonia. Currently on room air.  Speech therapy evaluated the patient, did MBS, recommended regular diet with thin liquid  Chronic osteomyelitis of lower extremity: History of osteomyelitis of right trochanter with stage II ulcer.Appears to be healing, wound care consulted.  He was already treated with antibiotics and finished antibiotics course on 04/09/2022  Elevated liver enzymes: Most likely from sepsis.  Ultrasound of the bladder showed mild gallbladder wall thickening with intraluminal sludge, no stones/fluid or murphy sign. No biliary dilatation. Mild hepatic steatosis. Hepatitis panel wnl.  Liver function has normalized  Tobacco abuse: Smokes 12 cigarettes a day.  Counseled for cessation  COPD: No signs of exacerbation.  Continue bronchodilators as needed  Hypertension: Takes amlodipine, Coreg, irbesartan at home.  Amlodipine ,coreg restarted.  Coronary artery disease: Status post CABG.  Denies any anginal symptoms.  On Lipitor, Coreg, aspirin at home.  Lipitor on hold due to elevated liver enzymes  Peripheral artery disease:status post stent placement with angioplasty of the left lower extremity in September 2001 and on Xarelto.Follows with  vascular surgery Dr. Everardo Beals, no claudication.  On statin, Xarelto and aspirin at home  Iron deficiency  anemia: Currently hemoglobin stable.  Continue  PROGRESS NOTE  VARDAN TREICHLER  ZOX:[id number] DOB: [birth year] DOA: 10/04/2022 PCP: Mattie Marlin, DO   Brief Narrative: Patient is a 72 year old male with past medical history of hypertension, coronary disease status post CABG, peripheral artery disease status post right femoral to popliteal bypass and bilateral iliac stenting, chronic HFrEF, SIADH, COPD, cervical spinal stenosis with chronic pain, and left hip ulcer with trochanteric osteomyelitis and chronic indwelling urinary catheter who presented to the emergency room with complaint of shortness of breath, poor oral intake.  On presentation, he was febrile, blood pressure stable.  Lab work showed mildly elevated liver enzymes.  UA was suspicious for UTI.  CTA chest did not show any PE but showed cardiomegaly without evidence of CHF, mildly distended gallbladder with wall thickening, layering fluid in the trachea consistent with retained secretion, COPD with bronchitis features.  Ultrasound of the abdomen showed mild gallbladder thickening with intraluminal large but no formed stones, no pericholecystic fluid, no biliary dilatation.  Blood cultures now showing gram-negative rods.  Patient started on broad spectrum antibiotics.  ID consulted.  Waiting for PT evaluation.  Clinically improved  Assessment & Plan:  Principal Problem:   Sepsis with bacteremia Active Problems:   Tobacco abuse   Transaminitis   Chronic osteomyelitis (HCC)   COPD (chronic obstructive pulmonary disease) (HCC)   Essential hypertension   CAD (coronary artery disease)   PAD (peripheral artery disease) (HCC)   Iron deficiency anemia   Hypercholesteremia   Bacteremia   UTI (urinary tract infection)   Gram-negative bacteremia/sepsis: Presented with shortness of breath, cough, fever.  Blood cultures showing gram-negative rods.  Source of bacteremia could be from urine versus aspiration versus GI source.  CT abdomen/pelvis also showed mild colonic wall thickening no  history of abdominal pain or diarrhea.  Received broad-spectrum antibiotics.  ID was following.   Elevated procalcitonin.  Afebrile this morning.  Currently on Zosyn.  Leukocytosis has resolved.  Will likely change antibiotics to cefadroxil on discharge, plan for total of 7 days of treatment  Urinary tract infection: UA suspicious for UTI.  Follow-up urine culture.  Has chronic Foley catheter, recently exchanged  2 days ago prior to admission.  Urine culture showed multiple species.  He follows with urology.  Suspected aspiration pneumonia:   CXR did not show any clear pneumonia. Currently on room air.  Speech therapy evaluated the patient, did MBS, recommended regular diet with thin liquid  Chronic osteomyelitis of lower extremity: History of osteomyelitis of right trochanter with stage II ulcer.Appears to be healing, wound care consulted.  He was already treated with antibiotics and finished antibiotics course on 04/09/2022  Elevated liver enzymes: Most likely from sepsis.  Ultrasound of the bladder showed mild gallbladder wall thickening with intraluminal sludge, no stones/fluid or murphy sign. No biliary dilatation. Mild hepatic steatosis. Hepatitis panel wnl.  Liver function has normalized  Tobacco abuse: Smokes 12 cigarettes a day.  Counseled for cessation  COPD: No signs of exacerbation.  Continue bronchodilators as needed  Hypertension: Takes amlodipine, Coreg, irbesartan at home.  Amlodipine ,coreg restarted.  Coronary artery disease: Status post CABG.  Denies any anginal symptoms.  On Lipitor, Coreg, aspirin at home.  Lipitor on hold due to elevated liver enzymes  Peripheral artery disease:status post stent placement with angioplasty of the left lower extremity in September 2001 and on Xarelto.Follows with  vascular surgery Dr. Everardo Beals, no claudication.  On statin, Xarelto and aspirin at home  Iron deficiency  anemia: Currently hemoglobin stable.  Continue  PROGRESS NOTE  VARDAN TREICHLER  ZOX:[id number] DOB: [birth year] DOA: 10/04/2022 PCP: Mattie Marlin, DO   Brief Narrative: Patient is a 72 year old male with past medical history of hypertension, coronary disease status post CABG, peripheral artery disease status post right femoral to popliteal bypass and bilateral iliac stenting, chronic HFrEF, SIADH, COPD, cervical spinal stenosis with chronic pain, and left hip ulcer with trochanteric osteomyelitis and chronic indwelling urinary catheter who presented to the emergency room with complaint of shortness of breath, poor oral intake.  On presentation, he was febrile, blood pressure stable.  Lab work showed mildly elevated liver enzymes.  UA was suspicious for UTI.  CTA chest did not show any PE but showed cardiomegaly without evidence of CHF, mildly distended gallbladder with wall thickening, layering fluid in the trachea consistent with retained secretion, COPD with bronchitis features.  Ultrasound of the abdomen showed mild gallbladder thickening with intraluminal large but no formed stones, no pericholecystic fluid, no biliary dilatation.  Blood cultures now showing gram-negative rods.  Patient started on broad spectrum antibiotics.  ID consulted.  Waiting for PT evaluation.  Clinically improved  Assessment & Plan:  Principal Problem:   Sepsis with bacteremia Active Problems:   Tobacco abuse   Transaminitis   Chronic osteomyelitis (HCC)   COPD (chronic obstructive pulmonary disease) (HCC)   Essential hypertension   CAD (coronary artery disease)   PAD (peripheral artery disease) (HCC)   Iron deficiency anemia   Hypercholesteremia   Bacteremia   UTI (urinary tract infection)   Gram-negative bacteremia/sepsis: Presented with shortness of breath, cough, fever.  Blood cultures showing gram-negative rods.  Source of bacteremia could be from urine versus aspiration versus GI source.  CT abdomen/pelvis also showed mild colonic wall thickening no  history of abdominal pain or diarrhea.  Received broad-spectrum antibiotics.  ID was following.   Elevated procalcitonin.  Afebrile this morning.  Currently on Zosyn.  Leukocytosis has resolved.  Will likely change antibiotics to cefadroxil on discharge, plan for total of 7 days of treatment  Urinary tract infection: UA suspicious for UTI.  Follow-up urine culture.  Has chronic Foley catheter, recently exchanged  2 days ago prior to admission.  Urine culture showed multiple species.  He follows with urology.  Suspected aspiration pneumonia:   CXR did not show any clear pneumonia. Currently on room air.  Speech therapy evaluated the patient, did MBS, recommended regular diet with thin liquid  Chronic osteomyelitis of lower extremity: History of osteomyelitis of right trochanter with stage II ulcer.Appears to be healing, wound care consulted.  He was already treated with antibiotics and finished antibiotics course on 04/09/2022  Elevated liver enzymes: Most likely from sepsis.  Ultrasound of the bladder showed mild gallbladder wall thickening with intraluminal sludge, no stones/fluid or murphy sign. No biliary dilatation. Mild hepatic steatosis. Hepatitis panel wnl.  Liver function has normalized  Tobacco abuse: Smokes 12 cigarettes a day.  Counseled for cessation  COPD: No signs of exacerbation.  Continue bronchodilators as needed  Hypertension: Takes amlodipine, Coreg, irbesartan at home.  Amlodipine ,coreg restarted.  Coronary artery disease: Status post CABG.  Denies any anginal symptoms.  On Lipitor, Coreg, aspirin at home.  Lipitor on hold due to elevated liver enzymes  Peripheral artery disease:status post stent placement with angioplasty of the left lower extremity in September 2001 and on Xarelto.Follows with  vascular surgery Dr. Everardo Beals, no claudication.  On statin, Xarelto and aspirin at home  Iron deficiency  anemia: Currently hemoglobin stable.  Continue

## 2022-10-07 NOTE — Progress Notes (Signed)
-  Continue pip-tazo , awaiting GPR ID, Cx re-incubating   BLOOD LEFT ARM   Special Requests BOTTLES DRAWN AEROBIC AND ANAEROBIC Blood Culture results may not be optimal due to an excessive volume of blood received in culture bottles  Culture  Setup Time GRAM POSITIVE RODS ANAEROBIC BOTTLE ONLY CRITICAL RESULT CALLED TO, READ BACK BY AND VERIFIED WITH: PHARMD UTOMWEN, A. [id number] FCP GRAM NEGATIVE RODS IN BOTH AEROBIC AND ANAEROBIC BOTTLES  Culture  Abnormal  ESCHERICHIA COLI CULTURE REINCUBATED FOR BETTER GROWTH Performed at [facility], [address] [place], [place] [address]

## 2022-10-07 NOTE — Evaluation (Signed)
Occupational Therapy Evaluation Patient Details Name: Caleb [name] MRN: [id number] DOB: 02-17-1950 Today's Date: 10/07/2022   History of Present Illness Caleb [name] is a 30 to male who presented to the emergency room with complaint of shortness of breath, poor oral intake. Admitted for sepsis with bacteremia. PMHx: hypertension, coronary disease status post CABG, peripheral artery disease status post right femoral to popliteal bypass and bilateral iliac stenting, chronic HFrEF, SIADH, COPD, cervical spinal stenosis with chronic pain, and left hip ulcer with trochanteric osteomyelitis and chronic indwelling urinary catheter   Clinical Impression   Caleb [name] was evaluated s/p the above admission list. He lives alone, walks with a RW and has intermittent assist from his dtr for ADLs at baseline. Upon evaluation the pt was limited by weakness, poor insight to safety and deficits, unsteady gait, bilat knee buckle and decreased activity tolerance. Overall he needed minA for bed mobility and up to mod A to get to the chair with the RW. Due to the deficits listed below the pt also needs max A for LB ADLs and set up A fro UB ADLs in sitting with increased time. Pt will benefit from continued acute OT services and skilled inpatient follow up therapy, <3 hours/day.         If plan is discharge home, recommend the following: A lot of help with walking and/or transfers;A lot of help with bathing/dressing/bathroom;Assistance with cooking/housework;Direct supervision/assist for medications management;Direct supervision/assist for financial management;Assist for transportation;Help with stairs or ramp for entrance    Functional Status Assessment  Patient has had a recent decline in their functional status and demonstrates the ability to make significant improvements in function in a reasonable and predictable amount of time.  Equipment Recommendations  None recommended by OT       Precautions / Restrictions  Precautions Precautions: Fall Precaution Comments: chronic cath Restrictions Weight Bearing Restrictions: No      Mobility Bed Mobility Overal bed mobility: Needs Assistance Bed Mobility: Supine to Sit     Supine to sit: Min assist          Transfers Overall transfer level: Needs assistance Equipment used: Rolling walker (2 wheels) Transfers: Sit to/from Stand, Bed to chair/wheelchair/BSC Sit to Stand: Mod assist     Step pivot transfers: Min assist     General transfer comment: bilat LE buckle upon standing      Balance Overall balance assessment: Needs assistance Sitting-balance support: No upper extremity supported Sitting balance-Leahy Scale: Fair     Standing balance support: Bilateral upper extremity supported, During functional activity Standing balance-Leahy Scale: Poor                             ADL either performed or assessed with clinical judgement   ADL Overall ADL's : Needs assistance/impaired Eating/Feeding: Independent   Grooming: Set up;Sitting   Upper Body Bathing: Set up;Sitting   Lower Body Bathing: Moderate assistance;Sit to/from stand   Upper Body Dressing : Set up;Sitting   Lower Body Dressing: Maximal assistance;Sit to/from stand   Toilet Transfer: Moderate assistance;Stand-pivot;Ambulation;Rolling walker (2 wheels);BSC/3in1   Toileting- Clothing Manipulation and Hygiene: Minimal assistance;Sitting/lateral lean       Functional mobility during ADLs: Moderate assistance;Rolling walker (2 wheels) General ADL Comments: unsteady, bilat LE buckle     Vision Baseline Vision/History: 1 Wears glasses Vision Assessment?: No apparent visual deficits     Perception Perception: Not tested       Praxis Praxis: Not  tested       Pertinent Vitals/Pain Pain Assessment Pain Assessment: Faces Faces Pain Scale: Hurts a little bit Pain Location: generalized Pain Descriptors / Indicators: Sore Pain Intervention(s):  Limited activity within patient's tolerance, Monitored during session     Extremity/Trunk Assessment Upper Extremity Assessment Upper Extremity Assessment: Generalized weakness   Lower Extremity Assessment Lower Extremity Assessment: Defer to PT evaluation   Cervical / Trunk Assessment Cervical / Trunk Assessment: Kyphotic   Communication Communication Communication: No apparent difficulties   Cognition Arousal: Alert Behavior During Therapy: Flat affect Overall Cognitive Status: No family/caregiver present to determine baseline cognitive functioning                                 General Comments: WFL for basic orientation. Slowed processing and decreased insight noted, pt perseverating on going home despite significant weakness and unsteady balance     General Comments  VSS on RA    Exercises     Shoulder Instructions      Home Living Family/patient expects to be discharged to:: Private residence Living Arrangements: Alone Available Help at Discharge: Family;Available PRN/intermittently Type of Home: Mobile home Home Access: Ramped entrance     Home Layout: One level     Bathroom Shower/Tub: Chief Strategy Officer: Standard     Home Equipment: Agricultural consultant (2 wheels);Cane - single point;Shower seat;Wheelchair - manual   Additional Comments: dtr lives next door, she works from home and assists as needed      Prior Functioning/Environment Prior Level of Function : Independent/Modified Independent             Mobility Comments: ambulates with, WC for longer community distances. denies recent falls ADLs Comments: mod I for most ADLs, dtr helps with showers if needed. Dtr completes IADLs        OT Problem List: Decreased range of motion;Decreased strength;Decreased activity tolerance;Impaired balance (sitting and/or standing);Decreased safety awareness;Decreased knowledge of use of DME or AE;Decreased knowledge of  precautions      OT Treatment/Interventions: Self-care/ADL training;Therapeutic exercise;DME and/or AE instruction;Therapeutic activities;Patient/family education;Balance training    OT Goals(Current goals can be found in the care plan section) Acute Rehab OT Goals Patient Stated Goal: home asap OT Goal Formulation: With patient Time For Goal Achievement: 10/21/22 Potential to Achieve Goals: Good ADL Goals Pt Will Perform Upper Body Dressing: with modified independence Pt Will Perform Lower Body Dressing: with supervision Pt Will Transfer to Toilet: with supervision;ambulating Additional ADL Goal #1: Pt will tolerate at least 8 minutes of OOB activity to demonstrate increased tolerance for ADLs at discharge  OT Frequency: Min 1X/week       AM-PAC OT "6 Clicks" Daily Activity     Outcome Measure Help from another person eating meals?: None Help from another person taking care of personal grooming?: A Little Help from another person toileting, which includes using toliet, bedpan, or urinal?: A Lot Help from another person bathing (including washing, rinsing, drying)?: A Lot Help from another person to put on and taking off regular upper body clothing?: A Little Help from another person to put on and taking off regular lower body clothing?: A Lot 6 Click Score: 16   End of Session Equipment Utilized During Treatment: Gait belt;Rolling walker (2 wheels) Nurse Communication: Mobility status  Activity Tolerance: Patient tolerated treatment well Patient left: in chair;with call bell/phone within reach;with chair alarm set  OT Visit Diagnosis: Unsteadiness  on feet (R26.81);Other abnormalities of gait and mobility (R26.89);Muscle weakness (generalized) (M62.81)                Time: 0347-4259 OT Time Calculation (min): 18 min Charges:  OT General Charges $OT Visit: 1 Visit OT Evaluation $OT Eval Moderate Complexity: 1 Mod  Caleb [name], OTR/L Acute Rehabilitation Services Office  [phone] Secure Chat Communication Preferred   Donia Pounds 10/07/2022, 10:08 AM

## 2022-10-07 NOTE — Plan of Care (Signed)

## 2022-10-07 NOTE — TOC Transition Note (Signed)
Transition of Care [facility]) - CM/SW Discharge Note   Patient Details  Name: Caleb [name] MRN: [id number] Date of Birth: 02-May-1950  Transition of Care [facility]) CM/SW Contact:  Lawerance Sabal, RN Phone Number: 10/07/2022, 12:29 PM   Clinical Narrative:    CAlled patient's room, no answer, called his main number, his daughter answered. We discussed plans for DC.  Patient is active w [facility] for nursing, placed resumption of HH order and added PT OT.  Daughter states that he has all needed DME at home.  Last vitals are RA.  Daughter will provide transportation home.  [facility] notified of DC later today    Final next level of care: Home w Home Health Services Barriers to Discharge: No Barriers Identified   Patient Goals and CMS Choice CMS Medicare.gov Compare Post Acute Care list provided to:: Other (Comment Required) Choice offered to / list presented to : Adult Children  Discharge Placement                         Discharge Plan and Services Additional resources added to the After Visit Summary for                  DME Arranged: N/A         HH Arranged: RN, PT, OT HH Agency: [facility] Date [facility] Agency Contacted: 10/07/22 Time HH Agency Contacted: 1229 Representative spoke with at [facility] Agency: Kandee Keen  Social Determinants of Health (SDOH) Interventions SDOH Screenings   Food Insecurity: Low Risk  (02/26/2022)   [facility]  Housing: Low Risk  (01/13/2022)  Transportation Needs: No Transportation Needs (03/07/2022)   Received from [facility], [facility]  Financial Resource Strain: Low Risk  (11/14/2021)   Received from [facility], [facility]  Social Connections: Unknown (11/13/2021)   Received from [facility], [facility]  Stress: No Stress Concern Present (11/14/2021)   Received from [facility], [facility]  Tobacco Use: High Risk (10/03/2022)   Received from [facility]     Readmission Risk  Interventions     No data to display

## 2022-10-08 ENCOUNTER — Other Ambulatory Visit (HOSPITAL_COMMUNITY): Payer: Self-pay

## 2022-10-08 MED ORDER — POTASSIUM CHLORIDE CRYS ER 20 MEQ PO TBCR
40.0000 meq | EXTENDED_RELEASE_TABLET | Freq: Every day | ORAL | 0 refills | Status: DC
  Filled 2022-10-08: qty 10, 5d supply, fill #0

## 2022-10-08 MED ORDER — AMOXICILLIN-POT CLAVULANATE 875-125 MG PO TABS
1.0000 | ORAL_TABLET | Freq: Two times a day (BID) | ORAL | 0 refills | Status: AC
  Filled 2022-10-08: qty 22, 11d supply, fill #0

## 2022-10-08 MED ORDER — IRBESARTAN 300 MG PO TABS
300.0000 mg | ORAL_TABLET | Freq: Every day | ORAL | Status: DC
  Administered 2022-10-08: 300 mg via ORAL
  Filled 2022-10-08: qty 1

## 2022-10-08 NOTE — Consult Note (Signed)
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  Lift foam daily to assess.  Change foam dressing q3 days and prn soiling.   Patients daughter should resume care as prescribed by [facility] at discharge and follow up with them as scheduled.   WOC team will not follow at this time. Re-consult if further needs arise.   Thank you,    Priscella Mann MSN, RN-BC, [employer] [phone]

## 2022-10-09 LAB — CULTURE, BLOOD (ROUTINE X 2): Special Requests: ADEQUATE

## 2022-10-11 LAB — CULTURE, BLOOD (ROUTINE X 2)
Culture: NO GROWTH
Culture: NO GROWTH
Special Requests: ADEQUATE
Special Requests: ADEQUATE

## 2022-11-08 ENCOUNTER — Other Ambulatory Visit (HOSPITAL_BASED_OUTPATIENT_CLINIC_OR_DEPARTMENT_OTHER): Payer: Self-pay

## 2022-11-08 MED ORDER — PREGABALIN 75 MG PO CAPS
75.0000 mg | ORAL_CAPSULE | Freq: Three times a day (TID) | ORAL | 0 refills | Status: DC
  Filled 2022-11-08 – 2022-11-30 (×2): qty 270, 90d supply, fill #0

## 2022-11-30 ENCOUNTER — Other Ambulatory Visit (HOSPITAL_COMMUNITY): Payer: Self-pay

## 2022-12-10 NOTE — Progress Notes (Signed)
Date:  12/24/2022   ID:  [name], DOB 07/08/1950, MRN [id number]  PCP:  Mattie Marlin, DO  Cardiologist:  Charlton Haws, MD  Electrophysiologist:  None   Evaluation Performed:  Follow-Up Visit  Chief Complaint:  none  History of Present Illness:    72 y.o. with history of CAD/CABG in 2016. Last myovue done 12/28/19 was normal and nonischemic with EF 60%. Significant PVD followed at [facility]  With right fem-pop and bilateral iliacstents for non healing left heel ulcer He takes low dose xarelto for his PVD  He has Rx HTN, HLD and continues to smoke Lung cancer screening CT negative 12/28/19 Has chronic indwelling foley and right trochanteric osteomyelitis chronic pain   Seen by Dr Allyson Sabal 12/02/20 for left RAS no intervention deemed necessary   Has seen neurology for neuropathy and right hand weakness EMG normal he had MRI with some  Spinal stenosis 03/13/20 not clear if this bad enough to cause clinical syndrome Neurology discussed Cutting back on ETOH in regard to "neuropathy "   ETOH excess admitted August 2022 with hyponatremia thought secondary to beer and lasix. MS changes and Cymbalta dc/   Colonoscopy 01/20/21 with multiple polyps biopsied and xarelto  held for 5 days   No motivation to stop drinking or smoking Has not had pneumonia shot, flu shot or COVID  Admitted 09/2022 with Ecoli sepsis Rx 2 weeks Augmentin ? 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   LIMA to LAD, RIMA to RCA, SVG to OM1, EVH via right thigh   Tobacco abuse    Past Surgical History:  Procedure Laterality Date   BIOPSY   10/31/2018   Procedure: BIOPSY;  Surgeon: Jeani Hawking, MD;  Location: [facility];  Service: Endoscopy;;   cataract surgery  Bilateral 12-2017 and 01-2018   COLONOSCOPY WITH PROPOFOL N/A 10/31/2018   Procedure: COLONOSCOPY WITH PROPOFOL;  Surgeon: Jeani Hawking, MD;  Location: [facility];  Service: Endoscopy;  Laterality: N/A;   COLONOSCOPY WITH PROPOFOL N/A 09/18/2019   Procedure: COLONOSCOPY WITH PROPOFOL;  Surgeon: Jeani Hawking, MD;  Location: [facility];  Service: Endoscopy;  Laterality: N/A;   COLONOSCOPY WITH PROPOFOL N/A 01/20/2021   Procedure: COLONOSCOPY WITH PROPOFOL;  Surgeon: Jeani Hawking, MD;  Location: [facility];  Service: Endoscopy;  Laterality: N/A;   COLONOSCOPY WITH PROPOFOL N/A 11/03/2021   Procedure: COLONOSCOPY WITH PROPOFOL;  Surgeon: Jeani Hawking, MD;  Location: [facility];  Service: Gastroenterology;  Laterality: N/A;   CORONARY ARTERY BYPASS GRAFT N/A 02/17/2014   Procedure: CORONARY ARTERY BYPASS GRAFTING (CABG);  Surgeon: Purcell Nails, MD;  Location: [facility] OR;  Service: Open Heart Surgery;  Laterality: N/A;  Times 3 using bilateral mammary arteries and endoscopically harvested right saphenous vein   ESOPHAGOGASTRODUODENOSCOPY (EGD) WITH PROPOFOL N/A 10/31/2018   Procedure: ESOPHAGOGASTRODUODENOSCOPY (EGD) WITH PROPOFOL;  Surgeon: Jeani Hawking, MD;  Location: [facility];  Service: Endoscopy;  Laterality: N/A;   ESOPHAGOGASTRODUODENOSCOPY (EGD) WITH PROPOFOL N/A 09/18/2019   Procedure: ESOPHAGOGASTRODUODENOSCOPY (EGD) WITH PROPOFOL;  Surgeon: Jeani Hawking, MD;  Location: [facility];  Service: Endoscopy;  Laterality: N/A;   FEMORAL ARTERY -  FEMORAL ARTERY BYPASS GRAFT      right femoral artery to below-knee popliteal artery bypass with PTFE and right first ray amputation 04/25/17   HEMOSTASIS CLIP PLACEMENT  10/31/2018   Procedure: HEMOSTASIS CLIP PLACEMENT;  Surgeon: Jeani Hawking, MD;  [facility]: [facility];  Service: Endoscopy;;   HEMOSTASIS CLIP PLACEMENT   09/18/2019   Procedure: HEMOSTASIS CLIP PLACEMENT;  Surgeon: Jeani Hawking, MD;  Location: [facility];  Service: Endoscopy;;   HEMOSTASIS CLIP PLACEMENT  01/20/2021   Procedure: HEMOSTASIS CLIP PLACEMENT;  Surgeon: Jeani Hawking, MD;  Location: [facility];  Service: Endoscopy;;   INTRAOPERATIVE TRANSESOPHAGEAL ECHOCARDIOGRAM N/A 02/17/2014   Procedure: INTRAOPERATIVE TRANSESOPHAGEAL ECHOCARDIOGRAM;  Surgeon: Purcell Nails, MD;  Location: [facility] OR;  Service: Open Heart Surgery;  Laterality: N/A;   LEFT HEART CATHETERIZATION WITH CORONARY ANGIOGRAM N/A 02/12/2014   Procedure: LEFT HEART CATHETERIZATION WITH CORONARY ANGIOGRAM;  Surgeon: Lesleigh Noe, MD;  Location: [facility] CATH LAB;  Service: Cardiovascular;  Laterality: N/A;   POLYPECTOMY  10/31/2018   Procedure: POLYPECTOMY;  Surgeon: Jeani Hawking, MD;  [facility];  Service: Endoscopy;;   POLYPECTOMY  09/18/2019   Procedure: POLYPECTOMY;  Surgeon: Jeani Hawking, MD;  Location: [facility];  Service: Endoscopy;;   POLYPECTOMY  01/20/2021   Procedure: POLYPECTOMY;  Surgeon: Jeani Hawking, MD;  [facility]: [facility];  Service: Endoscopy;;   POLYPECTOMY  11/03/2021   Procedure: POLYPECTOMY;  Surgeon: Jeani Hawking, MD;  [facility]: [facility];  Service: Gastroenterology;;   Susa Day  09/18/2019   Procedure: Susa Day;  Surgeon: Jeani Hawking, MD;  Location: [facility];  Service: Endoscopy;;   SUBMUCOSAL INJECTION  09/18/2019   Procedure: SUBMUCOSAL INJECTION;  Surgeon: Jeani Hawking, MD;  [facility]: [facility];  Service: Endoscopy;;   SUBMUCOSAL LIFTING INJECTION  10/31/2018   Procedure: SUBMUCOSAL LIFTING INJECTION;  Surgeon: Jeani Hawking, MD;  Location: [facility];  Service: Endoscopy;;   TOE AMPUTATION Right    right great toe      Current Meds  Medication Sig   acetaminophen (TYLENOL) 500 MG tablet Take 1,000 mg by mouth 2 (two) times daily as needed for mild pain or moderate pain.    albuterol (VENTOLIN HFA) 108 (90  Base) MCG/ACT inhaler Inhale 2 puffs into the lungs every 6 (six) hours as needed for wheezing or shortness of breath.   alendronate (FOSAMAX) 70 MG tablet Take 1 tablet (70 mg total) by mouth once a week.   amLODipine (NORVASC) 5 MG tablet Take 1 tablet (5 mg total) by mouth daily.   aspirin EC 81 MG tablet Take 81 mg by mouth daily. Swallow whole.   atorvastatin (LIPITOR) 80 MG tablet Take 1 tablet (80 mg total) by mouth daily for cholesterol   carvedilol (COREG) 25 MG tablet Take 1 tablet (25 mg total) by mouth 2 (two) times daily with a meal.   Cholecalciferol (VITAMIN D-3) 25 MCG (1000 UT) CAPS Take 1,000 Units by mouth daily with breakfast.   ferrous sulfate 325 (65 FE) MG tablet Take 1 tablet (325 mg total) by mouth daily.   fexofenadine (ALLEGRA) 180 MG tablet Take 180 mg by mouth daily as needed for allergies.   Fluticasone-Umeclidin-Vilant (TRELEGY ELLIPTA) 100-62.5-25 MCG/ACT AEPB Take 1 puff by mouth daily.   folic acid (FOLVITE) 1 MG tablet Take 1 tablet (1 mg total) by mouth daily.   irbesartan (AVAPRO) 300 MG tablet Take 1 tablet (300 mg total) by mouth daily.   nitroGLYCERIN (NITROSTAT) 0.4 MG SL tablet Place 0.4 mg under the tongue  every 5 (five) minutes as needed for chest pain.   Olopatadine HCl 0.2 % SOLN Place 1 drop into both eyes daily as needed (allergies).   pantoprazole (PROTONIX) 40 MG tablet Take 1 tablet (40 mg total) by mouth daily.   pregabalin (LYRICA) 75 MG capsule Take 1 capsule (75 mg total) by mouth 3 (three) times daily.   sodium chloride 1 g tablet Take 1 tablet (1 g total) by mouth 2 (two) times daily.   thiamine (VITAMIN B1) 100 MG tablet Take 1 tablet (100 mg total) by mouth daily.   vitamin B-12 (CYANOCOBALAMIN) 1000 MCG tablet Take 1,000 mcg by mouth daily with breakfast.   XARELTO 2.5 MG TABS tablet Take 1 tablet (2.5 mg total) by mouth 2 (two) times daily.   [DISCONTINUED] tamsulosin (FLOMAX) 0.4 MG CAPS capsule Take 1 capsule (0.4 mg total) by mouth  daily.     Allergies:   Sulfamethoxazole-trimethoprim, Lisinopril, Losartan, and Pletal [cilostazol]   Social History   Tobacco Use   Smoking status: Every Day    Current packs/day: 0.00    Average packs/day: 0.5 packs/day for 35.0 years (17.5 ttl pk-yrs)    Types: Cigarettes    Start date: 02/12/1979    Last attempt to quit: 02/11/2014    Years since quitting: 8.8   Smokeless tobacco: Never   Tobacco comments:    has quit twice before   Vaping Use   Vaping status: Never Used  Substance Use Topics   Alcohol use: Yes    Alcohol/week: 0.0 standard drinks of alcohol    Comment: Six pack a day of beer   Drug use: Never     Family Hx: The patient's family history includes Cancer in his sister; Diabetes in his brother and mother; Heart attack in his brother and mother. There is no history of Stroke.  ROS:   Please see the history of present illness.    All other systems reviewed and are negative.   Prior CV studies:   The following studies were reviewed today: Echo July 2020  Labs/Other Tests and Data Reviewed:    EKG:  SR rate 66 PVC old IMI 08/25/18   Recent Labs: 01/12/2022: TSH 1.412 01/18/2022: Magnesium 2.3 10/04/2022: B Natriuretic Peptide 333.2 10/06/2022: ALT 41; Hemoglobin 11.2; Platelets 96 10/07/2022: BUN 6; Creatinine, Ser 0.80; Potassium 3.4; Sodium 130   Recent Lipid Panel Lab Results  Component Value Date/Time   CHOL 176 12/27/2015 02:48 PM   TRIG 247 (H) 12/27/2015 02:48 PM   HDL 52 12/27/2015 02:48 PM   CHOLHDL 3.4 12/27/2015 02:48 PM   LDLCALC 75 12/27/2015 02:48 PM    Wt Readings from Last 3 Encounters:  12/24/22 152 lb (68.9 kg)  10/08/22 144 lb 13.5 oz (65.7 kg)  05/04/22 146 lb 4.8 oz (66.4 kg)     Objective:    Vital Signs:  BP 124/66   Pulse (!) 56   Ht 5' 3.5" (1.613 m)   Wt 152 lb (68.9 kg)   SpO2 98%   BMI 26.50 kg/m    Affect appropriate Chronically ill male  HEENT: normal Neck supple with no adenopathy JVP normal left  bruits no thyromegaly Lungs clear with no wheezing and good diaphragmatic motion Heart:  S1/S2 no murmur, no rub, gallop or click PMI normal post sternotomy  Abdomen: benighn, BS positve, no tenderness, no AAA no bruit.  No HSM or HJR LLE in boot ulcer on heel almost gone with black eschar Indwelling foley   ASSESSMENT &  PLAN:    CABG x 3 2016- No recent angina non ischemic myovue 12/28/19 continue medical Rx  PVD- S/p RFP bypass  And stents followed by Dr Benancio Deeds Left RAS followed by Dr Allyson Sabal despite RAR 6 kidney size normal and BP controlled and no angiography planned   Chronic anticoagulation- On Xarelto for PVD no bleeding issues   HTN- Well controlled.  Continue current medications and low sodium Dash type diet.      HLD- On high dose statin Rx followed by PCP  Smoking:   Counseled on cessation <10 minutes lung cancer screening CT negative 12/28/19 CTA 10/05/22 with no cancer but subsegmental impactions, COPD and bronchitis no PE   GI:  Post polypectomy 09/18/19 f/u Dr Elnoria Howard Repeat colon done 01/20/21 more polyps biopsied   Hyponatremia: low solute driven improved discussed beer intake f/u primary Diuretic d/c   Bruit:  Left sided [place] 12/23/19 plaque no stenosis   Neuropathy:  From cardiac perspective could have surgery However I suspect this would Be put off until he is out of his boot and gone to rehab to gain strength. He has f/u with neuro Surgery   Urology:  indwelling foley Sepsis 09/2022 with bacteremia improved F/U Dr Alvester Morin he is deferring supra pubic catheter which I think would be better   Medication Adjustments/Labs and Tests Ordered: Current medicines are reviewed at length with the patient today.  Concerns regarding medicines are outlined above.   Tests Ordered:  None  Medication Changes: No orders of the defined types were placed in this encounter.   Follow Up:   6 months  Signed, Charlton Haws, MD  12/24/2022 9:09 AM    [facility]

## 2022-12-21 ENCOUNTER — Other Ambulatory Visit (HOSPITAL_COMMUNITY): Payer: Self-pay

## 2022-12-24 ENCOUNTER — Encounter: Payer: Self-pay | Admitting: Cardiovascular Disease

## 2022-12-24 ENCOUNTER — Other Ambulatory Visit (HOSPITAL_COMMUNITY): Payer: Self-pay

## 2022-12-24 ENCOUNTER — Ambulatory Visit: Payer: HMO | Attending: Cardiovascular Disease | Admitting: Cardiovascular Disease

## 2022-12-24 VITALS — BP 124/66 | HR 56 | Ht 63.5 in | Wt 152.0 lb

## 2022-12-24 DIAGNOSIS — I10 Essential (primary) hypertension: Secondary | ICD-10-CM

## 2022-12-24 DIAGNOSIS — Z951 Presence of aortocoronary bypass graft: Secondary | ICD-10-CM | POA: Diagnosis not present

## 2022-12-24 DIAGNOSIS — I739 Peripheral vascular disease, unspecified: Secondary | ICD-10-CM | POA: Diagnosis not present

## 2022-12-24 DIAGNOSIS — I701 Atherosclerosis of renal artery: Secondary | ICD-10-CM | POA: Diagnosis not present

## 2022-12-24 MED ORDER — NITROGLYCERIN 0.4 MG SL SUBL
0.4000 mg | SUBLINGUAL_TABLET | SUBLINGUAL | 3 refills | Status: AC | PRN
  Filled 2022-12-24: qty 25, 10d supply, fill #0
  Filled 2023-01-01: qty 25, 1d supply, fill #0

## 2022-12-24 MED ORDER — CARVEDILOL 25 MG PO TABS
25.0000 mg | ORAL_TABLET | Freq: Two times a day (BID) | ORAL | 0 refills | Status: DC
  Filled 2022-12-24: qty 180, 90d supply, fill #0

## 2022-12-24 MED ORDER — IRBESARTAN 300 MG PO TABS
300.0000 mg | ORAL_TABLET | Freq: Every day | ORAL | 0 refills | Status: DC
  Filled 2022-12-24: qty 90, 90d supply, fill #0

## 2022-12-24 MED ORDER — ATORVASTATIN CALCIUM 80 MG PO TABS
80.0000 mg | ORAL_TABLET | Freq: Every day | ORAL | 1 refills | Status: DC
  Filled 2022-12-24: qty 90, 90d supply, fill #0
  Filled 2023-03-11: qty 90, 90d supply, fill #1

## 2022-12-24 NOTE — Patient Instructions (Signed)
Medication Instructions:  Your physician recommends that you continue on your current medications as directed. Please refer to the Current Medication list given to you today.  *If you need a refill on your cardiac medications before your next appointment, please call your pharmacy*  Lab Work: If you have labs (blood work) drawn today and your tests are completely normal, you will receive your results only by: [facility] (if you have MyChart) OR A paper copy in the mail If you have any lab test that is abnormal or we need to change your treatment, we will call you to review the results.  Testing/Procedures: None ordered today.  Follow-Up: At [facility], you and your health needs are our priority.  As part of our continuing mission to provide you with exceptional heart care, we have created designated Provider Care Teams.  These Care Teams include your primary Cardiologist (physician) and Advanced Practice Providers (APPs -  Physician Assistants and Nurse Practitioners) who all work together to provide you with the care you need, when you need it.  We recommend signing up for the patient portal called "MyChart".  Sign up information is provided on this After Visit Summary.  MyChart is used to connect with patients for Virtual Visits (Telemedicine).  Patients are able to view lab/test results, encounter notes, upcoming appointments, etc.  Non-urgent messages can be sent to your provider as well.   To learn more about what you can do with MyChart, go to [email or web address].    Your next appointment:   12 month(s)  Provider:   Jenkins Rouge, MD

## 2023-01-01 ENCOUNTER — Other Ambulatory Visit (HOSPITAL_COMMUNITY): Payer: Self-pay

## 2023-01-01 ENCOUNTER — Other Ambulatory Visit: Payer: Self-pay

## 2023-01-02 ENCOUNTER — Other Ambulatory Visit: Payer: Self-pay

## 2023-01-18 ENCOUNTER — Other Ambulatory Visit (HOSPITAL_COMMUNITY): Payer: Self-pay

## 2023-01-18 MED ORDER — PANTOPRAZOLE SODIUM 40 MG PO TBEC
40.0000 mg | DELAYED_RELEASE_TABLET | Freq: Every day | ORAL | 0 refills | Status: DC
  Filled 2023-01-18: qty 90, 90d supply, fill #0

## 2023-01-18 MED ORDER — AMLODIPINE BESYLATE 5 MG PO TABS
5.0000 mg | ORAL_TABLET | Freq: Every day | ORAL | 4 refills | Status: AC
  Filled 2023-01-18: qty 90, 90d supply, fill #0
  Filled 2023-04-19: qty 90, 90d supply, fill #1
  Filled 2023-06-28: qty 90, 90d supply, fill #2
  Filled 2023-10-16: qty 90, 90d supply, fill #3
  Filled 2023-12-27: qty 90, 90d supply, fill #4

## 2023-01-24 ENCOUNTER — Emergency Department (HOSPITAL_COMMUNITY): Payer: HMO

## 2023-01-24 ENCOUNTER — Emergency Department (HOSPITAL_COMMUNITY)
Admission: EM | Admit: 2023-01-24 | Discharge: 2023-01-24 | Disposition: A | Discharge status: Home / Self Care | Payer: HMO | Attending: Emergency Medicine | Admitting: Emergency Medicine

## 2023-01-24 DIAGNOSIS — Z7902 Long term (current) use of antithrombotics/antiplatelets: Secondary | ICD-10-CM | POA: Insufficient documentation

## 2023-01-24 DIAGNOSIS — I509 Heart failure, unspecified: Secondary | ICD-10-CM | POA: Insufficient documentation

## 2023-01-24 DIAGNOSIS — R7401 Elevation of levels of liver transaminase levels: Secondary | ICD-10-CM

## 2023-01-24 DIAGNOSIS — Z7901 Long term (current) use of anticoagulants: Secondary | ICD-10-CM | POA: Diagnosis not present

## 2023-01-24 DIAGNOSIS — Z79899 Other long term (current) drug therapy: Secondary | ICD-10-CM | POA: Insufficient documentation

## 2023-01-24 DIAGNOSIS — J449 Chronic obstructive pulmonary disease, unspecified: Secondary | ICD-10-CM | POA: Diagnosis not present

## 2023-01-24 DIAGNOSIS — R4182 Altered mental status, unspecified: Secondary | ICD-10-CM | POA: Insufficient documentation

## 2023-01-24 DIAGNOSIS — R17 Unspecified jaundice: Secondary | ICD-10-CM | POA: Diagnosis not present

## 2023-01-24 DIAGNOSIS — I110 Hypertensive heart disease with heart failure: Secondary | ICD-10-CM | POA: Insufficient documentation

## 2023-01-24 DIAGNOSIS — W19XXXA Unspecified fall, initial encounter: Secondary | ICD-10-CM | POA: Diagnosis not present

## 2023-01-24 DIAGNOSIS — Z7982 Long term (current) use of aspirin: Secondary | ICD-10-CM | POA: Insufficient documentation

## 2023-01-24 LAB — ETHANOL: Alcohol, Ethyl (B): <10 mg/dL (ref <10)

## 2023-01-24 LAB — URINALYSIS, W/ REFLEX TO CULTURE (INFECTION SUSPECTED)
Bilirubin Urine: NEGATIVE
Glucose, UA: NEGATIVE mg/dL
Ketones, ur: NEGATIVE mg/dL
Nitrite: NEGATIVE
Protein, ur: 100 mg/dL — AB
Specific Gravity, Urine: 1.019 (ref 1.005–1.030)
WBC, UA: >50 WBC/hpf (ref 0–5)
pH: 5 (ref 5.0–8.0)

## 2023-01-24 LAB — CBC WITH DIFFERENTIAL/PLATELET
Abs Immature Granulocytes: 0.51 10*3/uL — ABNORMAL HIGH (ref 0.00–0.07)
Basophils Absolute: 0 10*3/uL (ref 0.0–0.1)
Basophils Relative: 0 %
Eosinophils Absolute: 0 10*3/uL (ref 0.0–0.5)
Eosinophils Relative: 0 %
HCT: 34.6 % — ABNORMAL LOW (ref 39.0–52.0)
Hemoglobin: 11.3 g/dL — ABNORMAL LOW (ref 13.0–17.0)
Immature Granulocytes: 5 %
Lymphocytes Relative: 4 %
Lymphs Abs: 0.4 10*3/uL — ABNORMAL LOW (ref 0.7–4.0)
MCH: 28.2 pg (ref 26.0–34.0)
MCHC: 32.7 g/dL (ref 30.0–36.0)
MCV: 86.3 fL (ref 80.0–100.0)
Monocytes Absolute: 0.4 10*3/uL (ref 0.1–1.0)
Monocytes Relative: 5 %
Neutro Abs: 8.1 10*3/uL — ABNORMAL HIGH (ref 1.7–7.7)
Neutrophils Relative %: 86 %
Platelets: 82 10*3/uL — ABNORMAL LOW (ref 150–400)
RBC: 4.01 MIL/uL — ABNORMAL LOW (ref 4.22–5.81)
RDW: 16.1 % — ABNORMAL HIGH (ref 11.5–15.5)
WBC: 9.4 10*3/uL (ref 4.0–10.5)
nRBC: 0 % (ref 0.0–0.2)

## 2023-01-24 LAB — COMPREHENSIVE METABOLIC PANEL
ALT: 70 U/L — ABNORMAL HIGH (ref 0–44)
AST: 74 U/L — ABNORMAL HIGH (ref 15–41)
Albumin: 2.8 g/dL — ABNORMAL LOW (ref 3.5–5.0)
Alkaline Phosphatase: 160 U/L — ABNORMAL HIGH (ref 38–126)
Anion gap: 12 (ref 5–15)
BUN: 30 mg/dL — ABNORMAL HIGH (ref 8–23)
CO2: 19 mmol/L — ABNORMAL LOW (ref 22–32)
Calcium: 8.3 mg/dL — ABNORMAL LOW (ref 8.9–10.3)
Chloride: 103 mmol/L (ref 98–111)
Creatinine, Ser: 1.45 mg/dL — ABNORMAL HIGH (ref 0.61–1.24)
GFR, Estimated: 51 mL/min — ABNORMAL LOW (ref >60)
Glucose, Bld: 119 mg/dL — ABNORMAL HIGH (ref 70–99)
Potassium: 3.8 mmol/L (ref 3.5–5.1)
Sodium: 134 mmol/L — ABNORMAL LOW (ref 135–145)
Total Bilirubin: 4.8 mg/dL — ABNORMAL HIGH (ref 0.0–1.2)
Total Protein: 5.9 g/dL — ABNORMAL LOW (ref 6.5–8.1)

## 2023-01-24 LAB — I-STAT CHEM 8, ED
BUN: 35 mg/dL — ABNORMAL HIGH (ref 8–23)
Calcium, Ion: 1.08 mmol/L — ABNORMAL LOW (ref 1.15–1.40)
Chloride: 103 mmol/L (ref 98–111)
Creatinine, Ser: 1.4 mg/dL — ABNORMAL HIGH (ref 0.61–1.24)
Glucose, Bld: 121 mg/dL — ABNORMAL HIGH (ref 70–99)
HCT: 33 % — ABNORMAL LOW (ref 39.0–52.0)
Hemoglobin: 11.2 g/dL — ABNORMAL LOW (ref 13.0–17.0)
Potassium: 3.8 mmol/L (ref 3.5–5.1)
Sodium: 136 mmol/L (ref 135–145)
TCO2: 23 mmol/L (ref 22–32)

## 2023-01-24 LAB — PROTIME-INR
INR: 1.8 — ABNORMAL HIGH (ref 0.8–1.2)
Prothrombin Time: 21.3 s — ABNORMAL HIGH (ref 11.4–15.2)

## 2023-01-24 LAB — LIPASE, BLOOD: Lipase: 18 U/L (ref 11–51)

## 2023-01-24 LAB — AMMONIA: Ammonia: 33 umol/L (ref 9–35)

## 2023-01-24 LAB — APTT: aPTT: 35 s (ref 24–36)

## 2023-01-24 NOTE — ED Notes (Signed)
 Pt given food and beverage; okay per PA Meredeth Ide

## 2023-01-24 NOTE — ED Triage Notes (Addendum)
 PPt bib [facility] came in from home as a FOT. Pt did not hit his head, no LOC. Pt fell on carpet floor. Pt has urinary catheter in place. Ulcer on left foot noted by ems. Pt uses walker @ baseline.  Pt takes zyralto and plavis.  EMS VITALS 88/46 ems , most recent 100/50 CBG 164 66  20 Left forearm.

## 2023-01-24 NOTE — ED Provider Notes (Signed)
 [place] EMERGENCY DEPARTMENT AT [facility] Provider Note   CSN: [id number] Arrival date & time: 01/24/23  1259     History  Chief Complaint  Patient presents with   Spenser Harren is a 73 y.o. male.  DERWOOD BECRAFT is a 73 y.o. male with a history of COPD, CHF, hypertension, PAD, chronic indwelling Foley catheter, who presents via EMS from home after a fall.  Patient reports that he was walking with his walker and he thinks that one of the wheels got caught and he fell onto carpet.  He reports that he had a trash can but did not hit his head and denies loss of consciousness.  He reports that he then called his daughter and his home health care nurse was there to change his dressing and catheter but noted that his urine was very dark and recommended that he go to the hospital for reevaluation.  Patient was noted to have low blood pressure with EMS initially of 88/46 but this improved in transit.  Patient's daughter provides additional history by phone reporting that he has seemed to be acting strangely and not quite herself over the past 3 to 4 days and that he does not typically have falls like this.  She is concerned he could have some sort of infection or something going on.  She reports a history of chronic alcohol abuse but stopped drinking 6 to 7 months ago, no known history of liver disease.  Patient is on Xarelto  and Plavix.  The history is provided by the patient, a relative and the EMS personnel.  Fall Pertinent negatives include no chest pain, no abdominal pain and no shortness of breath.       Home Medications Prior to Admission medications   Medication Sig Start Date End Date Taking? Authorizing Provider  acetaminophen  (TYLENOL ) 500 MG tablet Take 1,000 mg by mouth 2 (two) times daily as needed for mild pain or moderate pain.     [provider]  albuterol  (VENTOLIN  HFA) 108 (90 Base) MCG/ACT inhaler Inhale 2 puffs into the lungs every 6 (six)  hours as needed for wheezing or shortness of breath. 07/24/18   Neda Jennet LABOR, MD  alendronate  (FOSAMAX ) 70 MG tablet Take 1 tablet (70 mg total) by mouth once a week. 09/06/22     amLODipine  (NORVASC ) 5 MG tablet Take 1 tablet (5 mg total) by mouth daily. 01/18/23     aspirin  EC 81 MG tablet Take 81 mg by mouth daily. Swallow whole.    [provider]  atorvastatin  (LIPITOR ) 80 MG tablet Take 1 tablet (80 mg total) by mouth daily for cholesterol 12/24/22     carvedilol  (COREG ) 25 MG tablet Take 1 tablet (25 mg total) by mouth 2 (two) times daily with a meal. 12/24/22     Cholecalciferol  (VITAMIN D -3) 25 MCG (1000 UT) CAPS Take 1,000 Units by mouth daily with breakfast.    [provider]  ferrous sulfate  325 (65 FE) MG tablet Take 1 tablet (325 mg total) by mouth daily. 08/24/21   Tanda Prentice DEL, MD  fexofenadine (ALLEGRA) 180 MG tablet Take 180 mg by mouth daily as needed for allergies.    [provider]  Fluticasone -Umeclidin-Vilant (TRELEGY ELLIPTA ) 100-62.5-25 MCG/ACT AEPB Take 1 puff by mouth daily. 11/16/21     folic acid  (FOLVITE ) 1 MG tablet Take 1 tablet (1 mg total) by mouth daily. 09/06/22  irbesartan  (AVAPRO ) 300 MG tablet Take 1 tablet (300 mg total) by mouth daily. 12/24/22     nitroGLYCERIN  (NITROSTAT ) 0.4 MG SL tablet Place 1 tablet (0.4 mg total) under the tongue every 5 (five) minutes as needed for chest pain. 12/24/22   Nishan, Peter C, MD  Olopatadine  HCl 0.2 % SOLN Place 1 drop into both eyes daily as needed (allergies).    [provider]  pantoprazole  (PROTONIX ) 40 MG tablet Take 1 tablet (40 mg total) by mouth daily. 01/18/23     potassium chloride  SA (KLOR-CON  M) 20 MEQ tablet Take 2 tablets (40 mEq total) by mouth daily for 5 days. 10/08/22 10/13/22  Jillian Buttery, MD  pregabalin  (LYRICA ) 75 MG capsule Take 1 capsule (75 mg total) by mouth 3 (three) times daily. 11/08/22     sodium chloride  1 g tablet Take 1 tablet (1 g total) by mouth 2  (two) times daily. 01/30/22     thiamine  (VITAMIN B1) 100 MG tablet Take 1 tablet (100 mg total) by mouth daily. 08/24/21     vitamin B-12 (CYANOCOBALAMIN ) 1000 MCG tablet Take 1,000 mcg by mouth daily with breakfast.    [provider]  XARELTO  2.5 MG TABS tablet Take 1 tablet (2.5 mg total) by mouth 2 (two) times daily. 06/25/22         Allergies    Sulfamethoxazole-trimethoprim, Lisinopril , Losartan , and Pletal [cilostazol]    Review of Systems   Review of Systems  Constitutional:  Negative for chills and fever.  HENT: Negative.    Respiratory:  Negative for cough and shortness of breath.   Cardiovascular:  Negative for chest pain.  Gastrointestinal:  Negative for abdominal pain, nausea and vomiting.  Genitourinary:  Negative for dysuria.       Dark    Physical Exam Updated Vital Signs BP (!) 112/47 (BP Location: Right Arm)   Pulse 70   Temp 98.7 F (37.1 C) (Oral)   Resp (!) 22   Ht 5' 3.5 (1.613 m)   Wt 68.9 kg   SpO2 100%   BMI 26.49 kg/m  Physical Exam Vitals and nursing note reviewed.  Constitutional:      General: He is not in acute distress.    Appearance: Normal appearance. He is well-developed. He is not diaphoretic.  HENT:     Head: Normocephalic and atraumatic.     Comments: No obvious hematoma, step-off or deformity over the scalp, negative Battle sign    Mouth/Throat:     Mouth: Mucous membranes are moist.     Pharynx: Oropharynx is clear.  Eyes:     General:        Right eye: No discharge.        Left eye: No discharge.     Pupils: Pupils are equal, round, and reactive to light.     Comments: Mild scleral icterus present  Neck:     Comments: No midline tenderness Cardiovascular:     Rate and Rhythm: Normal rate and regular rhythm.     Pulses: Normal pulses.     Heart sounds: Normal heart sounds.  Pulmonary:     Effort: Pulmonary effort is normal. No respiratory distress.     Breath sounds: Normal breath sounds. No wheezing or rales.      Comments: Respirations equal and unlabored, patient able to speak in full sentences, lungs clear to auscultation bilaterally  Abdominal:     General: Bowel sounds are normal. There is no distension.  Palpations: Abdomen is soft. There is no mass.     Tenderness: There is no abdominal tenderness. There is no guarding.     Comments: Abdomen soft, nondistended, nontender to palpation in all quadrants without guarding or peritoneal signs  Musculoskeletal:        General: No deformity.     Cervical back: Neck supple.  Skin:    General: Skin is warm and dry.     Capillary Refill: Capillary refill takes less than 2 seconds.     Coloration: Skin is jaundiced.     Comments: Patient appears slightly jaundice  Neurological:     Mental Status: He is alert and oriented to person, place, and time.     Coordination: Coordination normal.     Comments: Speech is clear, able to follow commands CN III-XII intact Normal strength in upper and lower extremities bilaterally including dorsiflexion and plantar flexion, strong and equal grip strength Sensation normal to light and sharp touch Moves extremities without ataxia, coordination intact  Psychiatric:        Mood and Affect: Mood normal.        Behavior: Behavior normal.     ED Results / Procedures / Treatments   Labs (all labs ordered are listed, but only abnormal results are displayed) Labs Reviewed  COMPREHENSIVE METABOLIC PANEL  CBC WITH DIFFERENTIAL/PLATELET  URINALYSIS, W/ REFLEX TO CULTURE (INFECTION SUSPECTED)  ETHANOL  PROTIME-INR  APTT  AMMONIA  I-STAT CHEM 8, ED    EKG None  Radiology DG Chest Port 1 View Result Date: 01/24/2023 CLINICAL DATA:  Status post fall with altered mental status EXAM: PORTABLE CHEST 1 VIEW COMPARISON:  Chest radiograph dated 10/05/2022 FINDINGS: Patient is rotated slightly to the right. Normal lung volumes. No focal consolidations. Subtle lucency projects over the left apex. No pleural effusion or  definite pneumothorax. Similar enlarged cardiomediastinal silhouette. Median sternotomy wires are nondisplaced. Multiple old displaced left rib fractures and right upper rib fractures. No radiographic finding of acute displaced fracture. IMPRESSION: 1. No radiographic finding of acute displaced fracture. 2. Subtle lucency projecting over the left apex is likely artifactual related to overlying skin fold. No definite pneumothorax. 3. Multiple old bilateral rib fractures. Electronically Signed   By: Limin  Xu M.D.   On: 01/24/2023 13:54   CT Head Wo Contrast Result Date: 01/24/2023 CLINICAL DATA:  Trauma EXAM: CT HEAD WITHOUT CONTRAST CT CERVICAL SPINE WITHOUT CONTRAST TECHNIQUE: Multidetector CT imaging of the head and cervical spine was performed following the standard protocol without intravenous contrast. Multiplanar CT image reconstructions of the cervical spine were also generated. RADIATION DOSE REDUCTION: This exam was performed according to the departmental dose-optimization program which includes automated exposure control, adjustment of the mA and/or kV according to patient size and/or use of iterative reconstruction technique. COMPARISON:  CT head 01/12/22 FINDINGS: CT HEAD FINDINGS Brain: No hemorrhage. No hydrocephalus. No extra-axial fluid collection. No CT evidence of an acute cortical infarct. No mass effect. No mass lesion. 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 Vascular: No hyperdense vessel or unexpected calcification. Skull: Normal soft tissue swelling the right frontal scalp. Sinuses/Orbits: No middle ear or mastoid effusion. Paranasal sinuses are clear. Bilateral lens replacement. Orbits are otherwise unremarkable. Other: None CT CERVICAL SPINE FINDINGS Alignment: Grade 1 anterolisthesis of C3 on C4 and C4 on C5. Mild retrolisthesis of C2 on C3. Skull base and vertebrae: No acute fracture. No primary bone lesion or focal pathologic process.  Soft tissues and  spinal canal: No prevertebral fluid or swelling. No visible canal hematoma. Disc levels: There are scattered levels of mild-to-moderate spinal canal narrowing, for example of the C5-C6 and C6-C7 levels. Upper chest: Negative. Other: No IMPRESSION: 1. No CT evidence of intracranial injury. 2. No acute fracture or traumatic subluxation of the cervical spine. 3. Mild soft tissue swelling the right frontal scalp. Electronically Signed   By: Lyndall Gore M.D.   On: 01/24/2023 13:48   CT Cervical Spine Wo Contrast Result Date: 01/24/2023 CLINICAL DATA:  Trauma EXAM: CT HEAD WITHOUT CONTRAST CT CERVICAL SPINE WITHOUT CONTRAST TECHNIQUE: Multidetector CT imaging of the head and cervical spine was performed following the standard protocol without intravenous contrast. Multiplanar CT image reconstructions of the cervical spine were also generated. RADIATION DOSE REDUCTION: This exam was performed according to the departmental dose-optimization program which includes automated exposure control, adjustment of the mA and/or kV according to patient size and/or use of iterative reconstruction technique. COMPARISON:  CT head 01/12/22 FINDINGS: CT HEAD FINDINGS Brain: No hemorrhage. No hydrocephalus. No extra-axial fluid collection. No CT evidence of an acute cortical infarct. No mass effect. No mass lesion. 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 Vascular: No hyperdense vessel or unexpected calcification. Skull: Normal soft tissue swelling the right frontal scalp. Sinuses/Orbits: No middle ear or mastoid effusion. Paranasal sinuses are clear. Bilateral lens replacement. Orbits are otherwise unremarkable. Other: None CT CERVICAL SPINE FINDINGS Alignment: Grade 1 anterolisthesis of C3 on C4 and C4 on C5. Mild retrolisthesis of C2 on C3. Skull base and vertebrae: No acute fracture. No primary bone lesion or focal pathologic process. Soft tissues and spinal canal: No  prevertebral fluid or swelling. No visible canal hematoma. Disc levels: There are scattered levels of mild-to-moderate spinal canal narrowing, for example of the C5-C6 and C6-C7 levels. Upper chest: Negative. Other: No IMPRESSION: 1. No CT evidence of intracranial injury. 2. No acute fracture or traumatic subluxation of the cervical spine. 3. Mild soft tissue swelling the right frontal scalp. Electronically Signed   By: Lyndall Gore M.D.   On: 01/24/2023 13:48    Procedures Procedures    Medications Ordered in ED Medications - No data to display  ED Course/ Medical Decision Making/ A&P                                 Medical Decision Making Amount and/or Complexity of Data Reviewed Labs: ordered. Radiology: ordered.   Patient presents from home after fall patient is on blood thinners but denies head injury.  Daughter does report that he has had some altered mental status over the past 3 to 4 days with darker urine.  On arrival patient appears slightly jaundice.  He does not have a known history of liver disease but does have history of chronic alcohol abuse.  Workup for potential infection versus derangement or liver pathology, as well as imaging given fall on blood thinners.  Lab work is pending at shift change.  Chest x-ray, CT of the head and cervical spine ordered, viewed and interpreted independently, no evidence of intracranial bleeding or traumatic injury.  Chest x-ray is unremarkable.  At shift change care signed out to PA Signe Servant who will follow-up on pending lab work and disposition appropriately.        Final Clinical Impression(s) / ED Diagnoses Final diagnoses:  Fall, initial encounter    Rx / DC  Orders ED Discharge Orders     None         [name] Larraine JULIANNA DEVONNA 01/24/23 1542    Elnor Hila P, DO 02/07/23 SHERRIAN

## 2023-01-24 NOTE — Discharge Instructions (Addendum)
 Please drink plenty of fluids.  Follow-up with Dr. Elnoria Howard in 1 week to further assess the transaminitis and elevated bilirubin.  You may return to the emergency department for any worsening symptoms.

## 2023-01-24 NOTE — ED Notes (Signed)
 Pt resting with eyes closed; respirations spontaneous, even, unlabored

## 2023-01-24 NOTE — ED Provider Notes (Signed)
  Physical Exam  BP (!) 119/46   Pulse 60   Temp 98.9 F (37.2 C) (Oral)   Resp 20   Ht 5' 3.5 (1.613 m)   Wt 68.9 kg   SpO2 96%   BMI 26.49 kg/m   Physical Exam Vitals and nursing note reviewed.  Constitutional:      General: He is not in acute distress.    Appearance: Normal appearance.     Comments: Appears chronically ill.  HENT:     Head: Normocephalic and atraumatic.  Eyes:     General:        Right eye: No discharge.        Left eye: No discharge.  Cardiovascular:     Comments: Regular rate and rhythm.  S1/S2 are distinct without any evidence of murmur, rubs, or gallops.  Radial pulses are 2+ bilaterally.  Dorsalis pedis pulses are 2+ bilaterally.  No evidence of pedal edema. Pulmonary:     Comments: Clear to auscultation bilaterally.  Normal effort.  No respiratory distress.  No evidence of wheezes, rales, or rhonchi heard throughout. Abdominal:     General: Abdomen is flat. Bowel sounds are normal. There is no distension.     Tenderness: There is no abdominal tenderness. There is no guarding or rebound.  Musculoskeletal:        General: Normal range of motion.     Cervical back: Neck supple.  Skin:    General: Skin is warm and dry.     Findings: No rash.  Neurological:     General: No focal deficit present.     Mental Status: He is alert.  Psychiatric:        Mood and Affect: Mood normal.        Behavior: Behavior normal.     Procedures  Procedures  ED Course / MDM   Clinical Course as of 01/24/23 2026  Thu Jan 24, 2023  2003 I spoke with Dr. Kristie with gastroenterology who agrees that the patient can follow-up in the outpatient setting. [CF]  2019 CBC with Differential(!) No evidence of leukocytosis.  There appears to be chronic anemia which is at the patient's baseline. [CF]  2020 Comprehensive metabolic panel(!) Mild hyponatremia.  There is some transaminitis which has been present for the last 3 months.  Total bili is up in comparison to baseline.  Creatinine is up from baseline but patient tolerating PO.  [CF]  2023 Urinalysis, w/ Reflex to Culture (Infection Suspected) -Urine, Clean Catch(!) Looks to be infected in the setting of a chronic indwelling catheter.  [CF]  2024 Lipase, blood Negative. [CF]  2026 Protime-INR(!) Prolonged the setting of anticoagulants. [CF]  2026 Ammonia Negative. [CF]  2026 Ethanol Negative. [CF]    Clinical Course User Index [CF] Theotis Cameron HERO, PA-C   Medical Decision Making Amount and/or Complexity of Data Reviewed Labs: ordered. Decision-making details documented in ED Course. Radiology: ordered.   Accepted handoff at shift change from Kehril PA-C. Please see prior provider note for more detail.   Briefly: Patient is 73 y.o. here for mechanical fall in the kitchen but appears jaundice.  DDX: concern for liver pathology.  Plan: Discharge home.  Follow-up with Dr. Kristie.             Theotis Cameron HERO, PA-C 01/24/23 2027    Bari Roxie HERO, DO 01/24/23 2313

## 2023-01-24 NOTE — ED Notes (Signed)
 Ultrasound at bedside

## 2023-01-27 LAB — URINE CULTURE: Culture: >=100000 — AB

## 2023-01-28 ENCOUNTER — Telehealth (HOSPITAL_BASED_OUTPATIENT_CLINIC_OR_DEPARTMENT_OTHER): Payer: Self-pay | Admitting: *Deleted

## 2023-01-28 NOTE — Telephone Encounter (Signed)
 Post ED Visit - Positive Culture Follow-up: Successful Patient Follow-Up  Culture assessed and recommendations reviewed by:  []  Rankin Dee, Pharm.D. []  Venetia Gully, Pharm.D., BCPS AQ-ID []  Garrel Crews, Pharm.D., BCPS []  Almarie Lunger, Pharm.D., BCPS []  [place], [address].D., BCPS, AAHIVP []  Rosaline Bihari, Pharm.D., BCPS, AAHIVP []  Vernell Meier, PharmD, BCPS []  Latanya Hint, PharmD, BCPS []  Donald Medley, PharmD, BCPS [x]  Koren Or, PharmD  Positive urine culture  [x]  Patient discharged without antimicrobial prescription and treatment is now indicated []  Organism is resistant to prescribed ED discharge antimicrobial []  Patient with positive blood cultures  Spoke to pts daughter. Daughter expressed pt still having urinary symptoms.  Pts PCP had ordered Macrobid.  Spoke to Pharmacist above and he stated Macrobid will only treat 2/3 of the bacteria on culture.  Advised Amoxicillin .  Informed daughter of the change to Amoxicillin  recommendations.  Changes discussed with ED provider: Burgess Fanti, MD New antibiotic prescription Amoxicillin  875mg  q12h x 7 days Called to [place], [place], [place]  Contacted patient's daughter, date 01/28/23, time 0929   Caleb [name] 01/28/2023, 9:29 AM

## 2023-02-13 ENCOUNTER — Other Ambulatory Visit (HOSPITAL_BASED_OUTPATIENT_CLINIC_OR_DEPARTMENT_OTHER): Payer: Self-pay

## 2023-03-11 ENCOUNTER — Other Ambulatory Visit (HOSPITAL_COMMUNITY): Payer: Self-pay

## 2023-03-11 ENCOUNTER — Other Ambulatory Visit: Payer: Self-pay

## 2023-03-11 MED ORDER — IRBESARTAN 300 MG PO TABS
300.0000 mg | ORAL_TABLET | Freq: Every day | ORAL | 0 refills | Status: DC
  Filled 2023-03-11: qty 90, 90d supply, fill #0

## 2023-03-11 MED ORDER — CARVEDILOL 25 MG PO TABS
25.0000 mg | ORAL_TABLET | Freq: Two times a day (BID) | ORAL | 0 refills | Status: DC
  Filled 2023-03-11: qty 180, 90d supply, fill #0

## 2023-03-12 ENCOUNTER — Other Ambulatory Visit (HOSPITAL_BASED_OUTPATIENT_CLINIC_OR_DEPARTMENT_OTHER): Payer: Self-pay

## 2023-03-12 MED ORDER — PREGABALIN 75 MG PO CAPS
75.0000 mg | ORAL_CAPSULE | Freq: Three times a day (TID) | ORAL | 0 refills | Status: DC
  Filled 2023-03-12 – 2023-03-15 (×2): qty 270, 90d supply, fill #0

## 2023-03-15 ENCOUNTER — Other Ambulatory Visit (HOSPITAL_BASED_OUTPATIENT_CLINIC_OR_DEPARTMENT_OTHER): Payer: Self-pay

## 2023-03-15 ENCOUNTER — Other Ambulatory Visit: Payer: Self-pay

## 2023-03-15 ENCOUNTER — Other Ambulatory Visit (HOSPITAL_COMMUNITY): Payer: Self-pay

## 2023-04-05 ENCOUNTER — Other Ambulatory Visit (HOSPITAL_COMMUNITY): Payer: Self-pay

## 2023-04-08 ENCOUNTER — Other Ambulatory Visit: Payer: Self-pay

## 2023-04-19 ENCOUNTER — Other Ambulatory Visit (HOSPITAL_COMMUNITY): Payer: Self-pay

## 2023-04-22 ENCOUNTER — Other Ambulatory Visit: Payer: Self-pay

## 2023-04-22 ENCOUNTER — Other Ambulatory Visit (HOSPITAL_COMMUNITY): Payer: Self-pay

## 2023-04-22 MED ORDER — PANTOPRAZOLE SODIUM 40 MG PO TBEC
40.0000 mg | DELAYED_RELEASE_TABLET | Freq: Every day | ORAL | 0 refills | Status: DC
  Filled 2023-04-22: qty 90, 90d supply, fill #0

## 2023-04-26 ENCOUNTER — Inpatient Hospital Stay: Payer: HMO | Attending: Hematology and Oncology

## 2023-04-26 ENCOUNTER — Other Ambulatory Visit: Payer: Self-pay | Admitting: Hematology and Oncology

## 2023-04-26 ENCOUNTER — Other Ambulatory Visit: Payer: Self-pay | Admitting: *Deleted

## 2023-04-26 ENCOUNTER — Other Ambulatory Visit: Payer: Self-pay

## 2023-04-26 DIAGNOSIS — D696 Thrombocytopenia, unspecified: Secondary | ICD-10-CM | POA: Insufficient documentation

## 2023-04-26 DIAGNOSIS — D472 Monoclonal gammopathy: Secondary | ICD-10-CM | POA: Insufficient documentation

## 2023-04-26 DIAGNOSIS — D649 Anemia, unspecified: Secondary | ICD-10-CM | POA: Diagnosis not present

## 2023-04-26 DIAGNOSIS — F17210 Nicotine dependence, cigarettes, uncomplicated: Secondary | ICD-10-CM | POA: Insufficient documentation

## 2023-04-26 LAB — CBC WITH DIFFERENTIAL (CANCER CENTER ONLY)
Abs Immature Granulocytes: 0.02 10*3/uL (ref 0.00–0.07)
Basophils Absolute: 0.1 10*3/uL (ref 0.0–0.1)
Basophils Relative: 1 %
Eosinophils Absolute: 0.1 10*3/uL (ref 0.0–0.5)
Eosinophils Relative: 2 %
HCT: 41.4 % (ref 39.0–52.0)
Hemoglobin: 13.9 g/dL (ref 13.0–17.0)
Immature Granulocytes: 0 %
Lymphocytes Relative: 25 %
Lymphs Abs: 1.6 10*3/uL (ref 0.7–4.0)
MCH: 30 pg (ref 26.0–34.0)
MCHC: 33.6 g/dL (ref 30.0–36.0)
MCV: 89.2 fL (ref 80.0–100.0)
Monocytes Absolute: 0.5 10*3/uL (ref 0.1–1.0)
Monocytes Relative: 7 %
Neutro Abs: 4.2 10*3/uL (ref 1.7–7.7)
Neutrophils Relative %: 65 %
Platelet Count: 143 10*3/uL — ABNORMAL LOW (ref 150–400)
RBC: 4.64 MIL/uL (ref 4.22–5.81)
RDW: 15.6 % — ABNORMAL HIGH (ref 11.5–15.5)
WBC Count: 6.5 10*3/uL (ref 4.0–10.5)
nRBC: 0 % (ref 0.0–0.2)

## 2023-04-26 LAB — CMP (CANCER CENTER ONLY)
ALT: 12 U/L (ref 0–44)
AST: 12 U/L — ABNORMAL LOW (ref 15–41)
Albumin: 4.2 g/dL (ref 3.5–5.0)
Alkaline Phosphatase: 127 U/L — ABNORMAL HIGH (ref 38–126)
Anion gap: 6 (ref 5–15)
BUN: 13 mg/dL (ref 8–23)
CO2: 28 mmol/L (ref 22–32)
Calcium: 9.2 mg/dL (ref 8.9–10.3)
Chloride: 104 mmol/L (ref 98–111)
Creatinine: 1.02 mg/dL (ref 0.61–1.24)
GFR, Estimated: >60 mL/min (ref >60)
Glucose, Bld: 170 mg/dL — ABNORMAL HIGH (ref 70–99)
Potassium: 3.9 mmol/L (ref 3.5–5.1)
Sodium: 138 mmol/L (ref 135–145)
Total Bilirubin: 0.5 mg/dL (ref 0.0–1.2)
Total Protein: 7.4 g/dL (ref 6.5–8.1)

## 2023-04-26 LAB — LACTATE DEHYDROGENASE: LDH: 122 U/L (ref 98–192)

## 2023-04-29 LAB — MULTIPLE MYELOMA PANEL, SERUM
Albumin SerPl Elph-Mcnc: 3.6 g/dL (ref 2.9–4.4)
Albumin/Glob SerPl: 1.2 (ref 0.7–1.7)
Alpha 1: 0.2 g/dL (ref 0.0–0.4)
Alpha2 Glob SerPl Elph-Mcnc: 0.7 g/dL (ref 0.4–1.0)
B-Globulin SerPl Elph-Mcnc: 1.3 g/dL (ref 0.7–1.3)
Gamma Glob SerPl Elph-Mcnc: 0.9 g/dL (ref 0.4–1.8)
Globulin, Total: 3.2 g/dL (ref 2.2–3.9)
IgA: 463 mg/dL — ABNORMAL HIGH (ref 61–437)
IgG (Immunoglobin G), Serum: 1127 mg/dL (ref 603–1613)
IgM (Immunoglobulin M), Srm: 67 mg/dL (ref 15–143)
M Protein SerPl Elph-Mcnc: 0.4 g/dL — ABNORMAL HIGH
Total Protein ELP: 6.8 g/dL (ref 6.0–8.5)

## 2023-04-29 LAB — KAPPA/LAMBDA LIGHT CHAINS
Kappa free light chain: 32.7 mg/L — ABNORMAL HIGH (ref 3.3–19.4)
Kappa, lambda light chain ratio: 1.52 (ref 0.26–1.65)
Lambda free light chains: 21.5 mg/L (ref 5.7–26.3)

## 2023-04-30 LAB — UPEP/UIFE/LIGHT CHAINS/TP, 24-HR UR
% BETA, Urine: 10.4 %
ALPHA 1 URINE: 3.2 %
Albumin, U: 63.8 %
Alpha 2, Urine: 12.1 %
Free Kappa Lt Chains,Ur: 15.87 mg/L (ref 1.17–86.46)
Free Kappa/Lambda Ratio: 5.97 (ref 1.83–14.26)
Free Lambda Lt Chains,Ur: 2.66 mg/L (ref 0.27–15.21)
GAMMA GLOBULIN URINE: 10.5 %
Total Protein, Urine-Ur/day: 587 mg/(24.h) — ABNORMAL HIGH (ref 30–150)
Total Protein, Urine: 38.5 mg/dL
Total Volume: 1525

## 2023-05-03 ENCOUNTER — Other Ambulatory Visit: Payer: Self-pay

## 2023-05-03 ENCOUNTER — Inpatient Hospital Stay: Payer: HMO | Admitting: Hematology and Oncology

## 2023-05-03 ENCOUNTER — Other Ambulatory Visit (HOSPITAL_COMMUNITY): Payer: Self-pay

## 2023-05-03 VITALS — BP 132/63 | HR 58 | Temp 97.7°F | Resp 16 | Wt 157.4 lb

## 2023-05-03 DIAGNOSIS — D472 Monoclonal gammopathy: Secondary | ICD-10-CM

## 2023-05-03 MED ORDER — XARELTO 2.5 MG PO TABS
2.5000 mg | ORAL_TABLET | Freq: Two times a day (BID) | ORAL | 0 refills | Status: DC
  Filled 2023-05-03: qty 60, 30d supply, fill #0
  Filled 2023-06-04: qty 60, 30d supply, fill #1
  Filled 2023-06-28: qty 60, 30d supply, fill #2

## 2023-05-03 NOTE — Progress Notes (Signed)
 [facility] [phone]   [phone]  PROGRESS NOTE  Patient Care Team: Lazoff, Shawn P, DO as PCP - General (Family Medicine) Delford Maude BROCKS, MD as PCP - Cardiology (Cardiology)  Hematological/Oncological History #IgG Kappa Monoclonal Gammopathy of Undetermined Significance 1) 03/07/2020: Multiple Myeloma Panel revealed M potein 0.5 with IFE showing IgG monoclonal protein with kappa light chain specificity. 2) 04/12/2020: Establish care with [employer] PA-C  Interval History:  Caleb [name] 73 y.o. male with medical history significant for IgG Kappa MGUS who presents for a follow up visit. The patient's last visit was on 05/04/2022. In the interim since the last visit he has had no major changes in his health.   Caleb [name] is accompanied by his daughter.  He reports he is had no changes in his health in interim since her last visit.  He continues to have a Foley bag in place with some sediment in the urine bag but no blood in the urine or bubbling/flowing.  He reports that he drinks plenty of water.  He reports his energy is about 8 or 9/10.  He notes he is had no changes in his weight.  He notes he does not have any new bone pain or back pain but does have his stable chronic back pain.  He reports that he did have a wound that was operated on and is now near completely healed.  His weight has been stable.  Overall he feels well and has no questions concerns or complaints today.  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   LIMA to LAD, RIMA to RCA, SVG to OM1, EVH via right thigh   Tobacco abuse     SURGICAL HISTORY: Past Surgical History:  Procedure Laterality Date   BIOPSY  10/31/2018    Procedure: BIOPSY;  Surgeon: Rollin Dover, MD;  Location: [facility];  Service: Endoscopy;;   cataract surgery  Bilateral 12-2017 and 01-2018   COLONOSCOPY WITH PROPOFOL  N/A 10/31/2018   Procedure: COLONOSCOPY WITH PROPOFOL ;  Surgeon: Rollin Dover, MD;  Location: [facility];  Service: Endoscopy;  Laterality: N/A;   COLONOSCOPY WITH PROPOFOL  N/A 09/18/2019   Procedure: COLONOSCOPY WITH PROPOFOL ;  Surgeon: Rollin Dover, MD;  Location: [facility];  Service: Endoscopy;  Laterality: N/A;   COLONOSCOPY WITH PROPOFOL  N/A 01/20/2021   Procedure: COLONOSCOPY WITH PROPOFOL ;  Surgeon: Rollin Dover, MD;  Location: [facility];  Service: Endoscopy;  Laterality: N/A;   COLONOSCOPY WITH PROPOFOL  N/A 11/03/2021   Procedure: COLONOSCOPY WITH PROPOFOL ;  Surgeon: Rollin Dover, MD;  Location: [facility];  Service: Gastroenterology;  Laterality: N/A;   CORONARY ARTERY BYPASS GRAFT N/A 02/17/2014   Procedure: CORONARY ARTERY BYPASS GRAFTING (CABG);  Surgeon: Sudie VEAR Laine, MD;  Location: [facility] OR;  Service: Open Heart Surgery;  Laterality: N/A;  Times 3 using bilateral mammary arteries and endoscopically harvested right saphenous vein   ESOPHAGOGASTRODUODENOSCOPY (EGD) WITH PROPOFOL  N/A 10/31/2018   Procedure: ESOPHAGOGASTRODUODENOSCOPY (EGD) WITH PROPOFOL ;  Surgeon: Rollin Dover, MD;  Location: [facility];  Service: Endoscopy;  Laterality: N/A;   ESOPHAGOGASTRODUODENOSCOPY (EGD) WITH PROPOFOL  N/A 09/18/2019   Procedure: ESOPHAGOGASTRODUODENOSCOPY (EGD) WITH PROPOFOL ;  Surgeon: Rollin Dover, MD;  Location: [facility];  Service: Endoscopy;  Laterality: N/A;  FEMORAL ARTERY - FEMORAL ARTERY BYPASS GRAFT      right femoral artery to below-knee popliteal artery bypass with PTFE and right first ray amputation 04/25/17   HEMOSTASIS CLIP PLACEMENT  10/31/2018   Procedure: HEMOSTASIS CLIP PLACEMENT;  Surgeon: Rollin Dover, MD;  [facility]: [facility];  Service: Endoscopy;;   HEMOSTASIS CLIP PLACEMENT  09/18/2019    Procedure: HEMOSTASIS CLIP PLACEMENT;  Surgeon: Rollin Dover, MD;  Location: [facility];  Service: Endoscopy;;   HEMOSTASIS CLIP PLACEMENT  01/20/2021   Procedure: HEMOSTASIS CLIP PLACEMENT;  Surgeon: Rollin Dover, MD;  Location: [facility];  Service: Endoscopy;;   INTRAOPERATIVE TRANSESOPHAGEAL ECHOCARDIOGRAM N/A 02/17/2014   Procedure: INTRAOPERATIVE TRANSESOPHAGEAL ECHOCARDIOGRAM;  Surgeon: Sudie VEAR Laine, MD;  Location: [facility] OR;  Service: Open Heart Surgery;  Laterality: N/A;   LEFT HEART CATHETERIZATION WITH CORONARY ANGIOGRAM N/A 02/12/2014   Procedure: LEFT HEART CATHETERIZATION WITH CORONARY ANGIOGRAM;  Surgeon: Victory LELON Claudene DOUGLAS, MD;  Location: [facility] CATH LAB;  Service: Cardiovascular;  Laterality: N/A;   POLYPECTOMY  10/31/2018   Procedure: POLYPECTOMY;  Surgeon: Rollin Dover, MD;  [facility]: [facility];  Service: Endoscopy;;   POLYPECTOMY  09/18/2019   Procedure: POLYPECTOMY;  Surgeon: Rollin Dover, MD;  Location: [facility];  Service: Endoscopy;;   POLYPECTOMY  01/20/2021   Procedure: POLYPECTOMY;  Surgeon: Rollin Dover, MD;  Location: [facility];  Service: Endoscopy;;   POLYPECTOMY  11/03/2021   Procedure: POLYPECTOMY;  Surgeon: Rollin Dover, MD;  Location: [facility];  Service: Gastroenterology;;   MATIAS  09/18/2019   Procedure: MATIAS;  Surgeon: Rollin Dover, MD;  Location: [facility];  Service: Endoscopy;;   SUBMUCOSAL INJECTION  09/18/2019   Procedure: SUBMUCOSAL INJECTION;  Surgeon: Rollin Dover, MD;  Location: [facility];  Service: Endoscopy;;   SUBMUCOSAL LIFTING INJECTION  10/31/2018   Procedure: SUBMUCOSAL LIFTING INJECTION;  Surgeon: Rollin Dover, MD;  Location: [facility];  Service: Endoscopy;;   TOE AMPUTATION Right    right great toe     SOCIAL HISTORY: Social History   Socioeconomic History   Marital status: Single    Spouse name: Not on file   Number of children: Not on file   Years of education: Not on file   Highest education  level: Not on file  Occupational History   Occupation: metal work  Tobacco Use   Smoking status: Every Day    Current packs/day: 0.00    Average packs/day: 0.5 packs/day for 35.0 years (17.5 ttl pk-yrs)    Types: Cigarettes    Start date: 02/12/1979    Last attempt to quit: 02/11/2014    Years since quitting: 9.2   Smokeless tobacco: Never   Tobacco comments:    has quit twice before   Vaping Use   Vaping status: Never Used  Substance and Sexual Activity   Alcohol use: Yes    Alcohol/week: 0.0 standard drinks of alcohol    Comment: Six pack a day of beer   Drug use: Never   Sexual activity: Not on file  Other Topics Concern   Not on file  Social History Narrative   Not on file   Social Drivers of Health   Financial Resource Strain: Low Risk  (11/14/2021)   Received from [facility]   Overall Financial Resource Strain (CARDIA)    Difficulty of Paying Living Expenses: Not hard at all  Food Insecurity: Low Risk  (03/26/2023)   Received from [facility]   Hunger Vital Sign    Worried About Running Out  of Food in the Last Year: Never true    Ran Out of Food in the Last Year: Never true  Transportation Needs: No Transportation Needs (03/26/2023)   Received from [facility]    In the past 12 months, has lack of reliable transportation kept you from medical appointments, meetings, work or from getting things needed for daily living? : No  Physical Activity: Not on file  Stress: No Stress Concern Present (11/14/2021)   Received from [facility] - Occupational Stress Questionnaire    Feeling of Stress : Not at all  Social Connections: Unknown (11/13/2021)   Received from [facility]   Social Network    Social Network: Not on file  Intimate Partner Violence: Not At Risk (10/07/2022)   Humiliation, Afraid, Rape, and Kick questionnaire    Fear of Current or Ex-Partner:  No    Emotionally Abused: No    Physically Abused: No    Sexually Abused: No    FAMILY HISTORY: Family History  Problem Relation Age of Onset   Heart attack Mother    Diabetes Mother    Heart attack Brother    Diabetes Brother    Cancer Sister    Stroke Neg Hx     ALLERGIES:  is allergic to sulfamethoxazole-trimethoprim, lisinopril , losartan , and pletal [cilostazol].  MEDICATIONS:  Current Outpatient Medications  Medication Sig Dispense Refill   acetaminophen  (TYLENOL ) 500 MG tablet Take 1,000 mg by mouth 2 (two) times daily as needed for mild pain or moderate pain.      albuterol  (VENTOLIN  HFA) 108 (90 Base) MCG/ACT inhaler Inhale 2 puffs into the lungs every 6 (six) hours as needed for wheezing or shortness of breath. (Patient not taking: Reported on 01/24/2023) 18 g 3   alendronate  (FOSAMAX ) 70 MG tablet Take 1 tablet (70 mg total) by mouth once a week. (Patient taking differently: Take 70 mg by mouth once a week. Saturday) 90 tablet 3   amLODipine  (NORVASC ) 5 MG tablet Take 1 tablet (5 mg total) by mouth daily. 90 tablet 4   aspirin  EC 81 MG tablet Take 81 mg by mouth daily. Swallow whole.     atorvastatin  (LIPITOR ) 80 MG tablet Take 1 tablet (80 mg total) by mouth daily for cholesterol 90 tablet 1   carvedilol  (COREG ) 25 MG tablet Take 1 tablet (25 mg total) by mouth 2 (two) times daily with a meal. 180 tablet 0   Cholecalciferol  (VITAMIN D -3) 25 MCG (1000 UT) CAPS Take 1,000 Units by mouth daily with breakfast.     ferrous sulfate  325 (65 FE) MG tablet Take 1 tablet (325 mg total) by mouth daily. (Patient taking differently: Take 325 mg by mouth 2 (two) times daily.) 90 tablet 4   fexofenadine (ALLEGRA) 180 MG tablet Take 180 mg by mouth daily as needed for allergies.     folic acid  (FOLVITE ) 1 MG tablet Take 1 tablet (1 mg total) by mouth daily. 90 tablet 4   irbesartan  (AVAPRO ) 300 MG tablet Take 1 tablet (300 mg total) by mouth daily. 90 tablet 0   nitroGLYCERIN  (NITROSTAT )  0.4 MG SL tablet Place 1 tablet (0.4 mg total) under the tongue every 5 (five) minutes as needed for chest pain. 25 tablet 3   pantoprazole  (PROTONIX ) 40 MG tablet Take 1 tablet (40 mg total) by mouth daily. 90 tablet 0   pregabalin  (LYRICA ) 75 MG capsule Take 1 capsule (75 mg  total) by mouth 3 (three) times daily. 270 capsule 0   thiamine  (VITAMIN B1) 100 MG tablet Take 1 tablet (100 mg total) by mouth daily. 90 tablet 4   vitamin B-12 (CYANOCOBALAMIN ) 1000 MCG tablet Take 1,000 mcg by mouth daily with breakfast.     XARELTO  2.5 MG TABS tablet Take 1 tablet (2.5 mg total) by mouth 2 (two) times daily. 180 tablet 0   No current facility-administered medications for this visit.    REVIEW OF SYSTEMS:   Constitutional: ( - ) fevers, ( - )  chills , ( - ) night sweats Eyes: ( - ) blurriness of vision, ( - ) double vision, ( - ) watery eyes Ears, nose, mouth, throat, and face: ( - ) mucositis, ( - ) sore throat Respiratory: ( - ) cough, ( - ) dyspnea, ( - ) wheezes Cardiovascular: ( - ) palpitation, ( - ) chest discomfort, ( - ) lower extremity swelling Gastrointestinal:  ( - ) nausea, ( - ) heartburn, ( - ) change in bowel habits Skin: ( - ) abnormal skin rashes Lymphatics: ( - ) new lymphadenopathy, ( - ) easy bruising Neurological: ( - ) numbness, ( - ) tingling, ( - ) new weaknesses Behavioral/Psych: ( - ) mood change, ( - ) new changes  All other systems were reviewed with the patient and are negative.  PHYSICAL EXAMINATION: ECOG PERFORMANCE STATUS: 0 - Asymptomatic  Vitals:   05/03/23 1025  BP: 132/63  Pulse: (!) 58  Resp: 16  Temp: 97.7 F (36.5 C)  SpO2: 99%      Filed Weights   05/03/23 1025  Weight: 157 lb 6.4 oz (71.4 kg)       GENERAL: Well-appearing elderly male, alert, no distress and comfortable SKIN: skin color, texture, turgor are normal, no rashes or significant lesions EYES: conjunctiva are pink and non-injected, sclera clear LUNGS: clear to auscultation  and percussion with normal breathing effort HEART: regular rate & rhythm and no murmurs and no lower extremity edema PSYCH: alert & oriented x 3, fluent speech NEURO: no focal motor/sensory deficits  LABORATORY DATA:  I have reviewed the data as listed    Latest Ref Rng & Units 04/26/2023   12:46 PM 01/24/2023    3:57 PM 01/24/2023    3:25 PM  CBC  WBC 4.0 - 10.5 K/uL 6.5   9.4   Hemoglobin 13.0 - 17.0 g/dL 86.0  88.7  88.6   Hematocrit 39.0 - 52.0 % 41.4  33.0  34.6   Platelets 150 - 400 K/uL 143   82        Latest Ref Rng & Units 04/26/2023   12:46 PM 01/24/2023    3:57 PM 01/24/2023    3:25 PM  CMP  Glucose 70 - 99 mg/dL 829  878  880   BUN 8 - 23 mg/dL 13  35  30   Creatinine 0.61 - 1.24 mg/dL 8.97  8.59  8.54   Sodium 135 - 145 mmol/L 138  136  134   Potassium 3.5 - 5.1 mmol/L 3.9  3.8  3.8   Chloride 98 - 111 mmol/L 104  103  103   CO2 22 - 32 mmol/L 28   19   Calcium  8.9 - 10.3 mg/dL 9.2   8.3   Total Protein 6.5 - 8.1 g/dL 7.4   5.9   Total Bilirubin 0.0 - 1.2 mg/dL 0.5   4.8   Alkaline Phos 38 - 126 U/L  127   160   AST 15 - 41 U/L 12   74   ALT 0 - 44 U/L 12   70     Lab Results  Component Value Date   MPROTEIN 0.4 (H) 04/26/2023   MPROTEIN 0.5 (H) 05/01/2022   MPROTEIN 0.5 (H) 09/11/2021   Lab Results  Component Value Date   KPAFRELGTCHN 32.7 (H) 04/26/2023   KPAFRELGTCHN 45.4 (H) 05/01/2022   KPAFRELGTCHN 38.3 (H) 09/11/2021   LAMBDASER 21.5 04/26/2023   LAMBDASER 25.7 05/01/2022   LAMBDASER 25.1 09/11/2021   KAPLAMBRATIO 5.97 04/26/2023   KAPLAMBRATIO 1.52 04/26/2023   KAPLAMBRATIO 1.77 (H) 05/01/2022    RADIOGRAPHIC STUDIES: No results found.  ASSESSMENT & PLAN Caleb [name] 73 y.o. male with medical history significant for IgG Kappa MGUS who presents for a follow up visit.   #IgG Kappa Monoclonal Gammopathy of Undetermined Significance --SPEP with IFE from 05/01/22 showed M-spike of 0.4, IgG monoclonal protein with kappa light chain specificity. K/L  ratio is 1.52 with no CRAB criteria.  --today will repeat CBC w/ diff, CMP, LDH, kappa/lambda light chains. Labs show WBC 6.5, Hgb 13.9, MCV 89.2, Plt 143 --Mild increase in protein in the urine.  We will repeat UPEP on a yearly basis, last performed in April 2025.  --DG bone met survey showed no lytic lesions.  Repeat this every 1-2 years.  -- No indication for a bone marrow biopsy based on above results. --RTC in 12 months with labs unless further workup is needed.   # Mild Anemia/Thrombocytopenia -- Thrombocytopenia with plt 143  -- Hemoglobin 13.9 today  --continue to monitor   No orders of the defined types were placed in this encounter.    All questions were answered. The patient knows to call the clinic with any problems, questions or concerns.  A total of more than 30 minutes were spent on this encounter with face-to-face time and non-face-to-face time, including preparing to see the patient, ordering tests and/or medications, counseling the patient and coordination of care as outlined above.   Norleen IVAR Kidney, MD Department of Hematology/Oncology [facility] Phone: [phone] Pager: [phone] Email: [email or web address]  05/04/2023 3:44 PM

## 2023-05-06 ENCOUNTER — Other Ambulatory Visit: Payer: Self-pay

## 2023-05-24 ENCOUNTER — Other Ambulatory Visit (HOSPITAL_COMMUNITY): Payer: Self-pay

## 2023-06-04 ENCOUNTER — Other Ambulatory Visit: Payer: Self-pay

## 2023-06-04 ENCOUNTER — Other Ambulatory Visit (HOSPITAL_BASED_OUTPATIENT_CLINIC_OR_DEPARTMENT_OTHER): Payer: Self-pay

## 2023-06-04 MED ORDER — PREGABALIN 75 MG PO CAPS
75.0000 mg | ORAL_CAPSULE | Freq: Three times a day (TID) | ORAL | 0 refills | Status: AC
  Filled 2023-07-03 (×2): qty 270, 90d supply, fill #0

## 2023-06-21 ENCOUNTER — Other Ambulatory Visit: Payer: Self-pay

## 2023-06-21 ENCOUNTER — Other Ambulatory Visit (HOSPITAL_COMMUNITY): Payer: Self-pay

## 2023-06-21 ENCOUNTER — Other Ambulatory Visit (HOSPITAL_BASED_OUTPATIENT_CLINIC_OR_DEPARTMENT_OTHER): Payer: Self-pay

## 2023-06-21 MED ORDER — ATORVASTATIN CALCIUM 80 MG PO TABS
80.0000 mg | ORAL_TABLET | Freq: Every day | ORAL | 1 refills | Status: DC
  Filled 2023-06-21: qty 90, 90d supply, fill #0
  Filled 2023-09-20: qty 90, 90d supply, fill #1

## 2023-06-21 MED ORDER — CARVEDILOL 25 MG PO TABS
25.0000 mg | ORAL_TABLET | Freq: Two times a day (BID) | ORAL | 0 refills | Status: DC
  Filled 2023-06-21: qty 180, 90d supply, fill #0

## 2023-06-21 MED ORDER — IRBESARTAN 300 MG PO TABS
300.0000 mg | ORAL_TABLET | Freq: Every day | ORAL | 0 refills | Status: DC
  Filled 2023-06-21: qty 90, 90d supply, fill #0

## 2023-06-28 ENCOUNTER — Other Ambulatory Visit: Payer: Self-pay

## 2023-06-28 ENCOUNTER — Other Ambulatory Visit (HOSPITAL_COMMUNITY): Payer: Self-pay

## 2023-06-28 MED ORDER — PANTOPRAZOLE SODIUM 40 MG PO TBEC
40.0000 mg | DELAYED_RELEASE_TABLET | Freq: Every day | ORAL | 0 refills | Status: DC
  Filled 2023-06-28 – 2023-07-01 (×2): qty 90, 90d supply, fill #0

## 2023-07-01 ENCOUNTER — Other Ambulatory Visit (HOSPITAL_COMMUNITY): Payer: Self-pay

## 2023-07-03 ENCOUNTER — Other Ambulatory Visit: Payer: Self-pay

## 2023-07-03 ENCOUNTER — Other Ambulatory Visit (HOSPITAL_BASED_OUTPATIENT_CLINIC_OR_DEPARTMENT_OTHER): Payer: Self-pay

## 2023-07-03 ENCOUNTER — Other Ambulatory Visit (HOSPITAL_COMMUNITY): Payer: Self-pay

## 2023-08-02 ENCOUNTER — Other Ambulatory Visit (HOSPITAL_COMMUNITY): Payer: Self-pay

## 2023-08-02 ENCOUNTER — Other Ambulatory Visit: Payer: Self-pay

## 2023-08-02 MED ORDER — XARELTO 2.5 MG PO TABS
2.5000 mg | ORAL_TABLET | Freq: Two times a day (BID) | ORAL | 0 refills | Status: DC
  Filled 2023-08-02: qty 60, 30d supply, fill #0
  Filled 2023-08-30: qty 60, 30d supply, fill #1
  Filled 2023-09-27: qty 60, 30d supply, fill #2

## 2023-08-30 ENCOUNTER — Other Ambulatory Visit (HOSPITAL_COMMUNITY): Payer: Self-pay

## 2023-09-20 ENCOUNTER — Other Ambulatory Visit (HOSPITAL_COMMUNITY): Payer: Self-pay

## 2023-09-20 ENCOUNTER — Other Ambulatory Visit: Payer: Self-pay

## 2023-09-20 MED ORDER — CARVEDILOL 25 MG PO TABS
25.0000 mg | ORAL_TABLET | Freq: Two times a day (BID) | ORAL | 0 refills | Status: DC
  Filled 2023-09-20: qty 180, 90d supply, fill #0

## 2023-09-20 MED ORDER — IRBESARTAN 300 MG PO TABS
300.0000 mg | ORAL_TABLET | Freq: Every day | ORAL | 0 refills | Status: DC
  Filled 2023-09-20: qty 90, 90d supply, fill #0

## 2023-09-20 MED ORDER — FOLIC ACID 1 MG PO TABS
1.0000 mg | ORAL_TABLET | Freq: Every day | ORAL | 4 refills | Status: AC
  Filled 2023-09-20: qty 90, 90d supply, fill #0
  Filled 2023-12-27: qty 90, 90d supply, fill #1

## 2023-09-27 ENCOUNTER — Other Ambulatory Visit (HOSPITAL_COMMUNITY): Payer: Self-pay

## 2023-09-27 ENCOUNTER — Other Ambulatory Visit: Payer: Self-pay

## 2023-09-30 ENCOUNTER — Other Ambulatory Visit (HOSPITAL_COMMUNITY): Payer: Self-pay

## 2023-09-30 ENCOUNTER — Encounter (HOSPITAL_COMMUNITY): Payer: Self-pay

## 2023-10-08 NOTE — Progress Notes (Signed)
 Please let patient know that his cholesterol and vitamin D  labs were normal.  Patient CBC had mildly low platelets (141 from 136) which is chronic and stable and will continue to observe.  Patient CMP showed mild to moderately low sodium (129 from 136) where patient had a prior history of low sodium in the past and would recommend for the patient to wean off of alcohol if he is drinking and to be on a 60 ounce a day fluid restriction and will repeat a nonfasting BMP lab in 2 weeks to see if patient needs to restart his salt tablets.  Please let patient also know that his potassium was slightly elevated and will continue to observe and his blood sugars were slightly elevated (106) and would recommend to limit the carbohydrates in his diet.  Call for questions/concerns.

## 2023-10-16 ENCOUNTER — Other Ambulatory Visit (HOSPITAL_BASED_OUTPATIENT_CLINIC_OR_DEPARTMENT_OTHER): Payer: Self-pay

## 2023-10-16 ENCOUNTER — Other Ambulatory Visit (HOSPITAL_COMMUNITY): Payer: Self-pay

## 2023-10-16 ENCOUNTER — Other Ambulatory Visit: Payer: Self-pay

## 2023-10-16 MED ORDER — PREGABALIN 75 MG PO CAPS
75.0000 mg | ORAL_CAPSULE | Freq: Three times a day (TID) | ORAL | 0 refills | Status: DC
  Filled 2023-10-16 (×3): qty 270, 90d supply, fill #0

## 2023-10-16 MED ORDER — PANTOPRAZOLE SODIUM 40 MG PO TBEC
40.0000 mg | DELAYED_RELEASE_TABLET | Freq: Every day | ORAL | 0 refills | Status: DC
  Filled 2023-10-16: qty 90, 90d supply, fill #0

## 2023-10-16 NOTE — Telephone Encounter (Signed)
 [facility]  [facility]  Established Patient Screening Note  Caleb [name] is a 73 y.o. old male presenting for return patient visit.  Pain Description: Since the last visit the pain is unchanged. The most significant location of pain is the left neck.  The 1-2 words that best describe the pain: pinch The pain improves with prescription pain medications. The pain is made worse with extension of neck and flexion of neck. The average daily pain score is 2/10. The pain can be as high as 7-8/10 at its worst. The pain interferes with: Nothing.  Therapies: Did the patient have an injection/procedure since the last visit? No Has the patient had any new imaging (X-ray, MRI, CT) for pain concerns since the last visit? No  Has physical therapy been initiated or completed for this pain concern? No Medications: Was a new medication for pain started by our clinic at the last visit? No Is the patient on a blood thinner (including aspirin , Goody/BC/Bayer powder)? Yes  - Name of anticoagulant/blood thinner: Other: Xarelto 

## 2023-10-16 NOTE — Progress Notes (Signed)
 [facility] Established Patient Visit   Referring Doctor: No ref. provider found Primary Doctor: Shawn P Lazoff, DO Date of Service: 10/16/2023  Being seen by Sherlean New, PA-C today in the [facility]. _______________________________________________________________________________________________________  Chief Complaint  Patient presents with  . Neck Pain    left   _______________________________________________________________________________________________________  History of Present Illness: Caleb [name] is a 73 y.o. male.  Patient denies any major changes in their health since the last visit.   Pain Description: Since the last visit the pain is unchanged. The most significant location of pain is the left side of his neck.  The pain does not radiate.  The 1-2 words that best describe the pain: pinch The pain improves with rest and use of prescription medications. The pain is made worse with movement (extension/flexion of neck). The average daily pain score is a 2/10. The pain can be as high as a 7-8/10 at its worst.  The pain interferes with: Nothing.   Therapies: Did the patient have an injection/procedure since the last visit? No  Has the patient had any new imaging (X-ray, MRI, CT) for pain concerns since the last visit? No   Has physical therapy been initiated or completed for this pain concern? Yes - Where was physical therapy completed? Home PT  Has a home exercise program (directed by a physical therapist or medical provider) been completed for this pain concern? Yes  Medications: Was a new medication for pain started by our clinic at the last visit? No  Is the patient on a blood thinner (including aspirin , Goody/BC/Bayer powder)? Yes: Xarelto  and Aspirin  81 mg _______________________________________________________________________________________________________  Historical  Information:  Medical History[1]   Surgical History[2]  Family History[3]  Social History   Tobacco Use  . Smoking status: Every Day    Current packs/day: 0.75    Average packs/day: 0.8 packs/day for 58.7 years (44.1 ttl pk-yrs)    Types: Cigarettes    Start date: 01/15/1965    Passive exposure: Never  . Smokeless tobacco: Former  . Tobacco comments:    2024-per 2023 LS form/chart(0.75); varied hx up to 1ppd  Substance Use Topics  . Alcohol use: Not Currently   Reviewed: Yes _______________________________________________________________________________________________________  Allergies: Sulfamethoxazole-trimethoprim, Cilostazol, Lisinopril , and Losartan   Current Medications[4] _______________________________________________________________________________________________________  Review of Systems: Reviewed by Sherlean Jenkins New, PA-C ROS was performed with positive/negative pertinent findings listed in HPI.  _______________________________________________________________________________________________________  Physical Exam:  Vitals:   10/16/23 1009 10/16/23 1011  BP: (!) 147/52 (!) 150/52  BP Location: Left arm Right arm  Patient Position: Sitting Sitting  Pulse: 54 56  Resp: 14   SpO2: 97%    Pain Assessment Pain Score  : 0  What number best describes your pain on average in the past week?: 0 - No pain What number best describes how, during the past week, pain has interfered with your enjoyment of life?: 2 What number best describes how, during the past week, pain has interfered with your general activity?: 2 PEG Pain Total Score: 1.33  General: Constitutional: Well developed, Well nourished, No acute distress and Interactive General: Patient is alert and orientated Psychological: The patient's mood is normal, and appropriate for the circumstances Gait:   Antalgic. Uses assist device - standard walker   _______________________________________________________________________________________________________ Monitoring/Surveillance: [place] PDMP Reviewed: Reviewed   CTE Measures: Is the patient taking prescription opioids? No _______________________________________________________________________________________________________ Imaging and Diagnostic Studies:  All applicable diagnostic studies related to this consultation have  been reviewed. Relevant diagnostic reports and/or my personal review of imaging or other diagnostic studies listed below:   EXAM: MRI of the cervical spine on 03/13/2020  ORDERING CLINICIAN: True Mar, MD, PhD  CLINICAL HISTORY: 74 year old man with muscle weakness and atrophy  COMPARISON FILMS: None   TECHNIQUE: MRI of the cervical spine was obtained utilizing 3 mm sagittal  slices from the posterior fossa down to the T3-4 level with T1, T2 and  inversion recovery views. In addition 4 mm axial slices from C2-3 down to  T1-2 level were included with T2 and gradient echo views.  CONTRAST: None  IMAGING SITE: [facility], [address] [place], [place], [place]   FINDINGS: :  On sagittal images, the spine is imaged from above the  cervicomedullary junction to T2.   T2 hyperintense signal is noted within  the pons.  Paravertebral soft tissue appears normal.  On the sagittal STIR  images, there appears to be increased signal within the spinal cord at  C3-C4.  This is less apparent on the axial images..   There is 2 mm  anterolisthesis of C4 upon C5 and 1 to 2 mm retrolisthesis of C6 upon C7.    Disc height is reduced at C5-C6 and C6-C7.  There is reversal of the  cervical curvature centered around C5-C6.  Endplate degenerative changes  are noted at C5-C6 and C6-C7.   The vertebral bodies have normal signal.    The discs and interspaces were further evaluated on axial views from C2 to  T1 as follows:   C2-C3: There is severe spinal stenosis (AP diameter 5.9 mm)  due to a large  central disc protrusion, left facet hypertrophy, uncovertebral spurring  and ligamenta flava hypertrophy.  Additionally, there is mild to moderate  left greater than right foraminal narrowing.  There does not appear to be  nerve root compression.   C3-C4: There is severe spinal stenosis (AP diameter 5.8 mm) due to disc  protrusion, uncovertebral spurring, severe ligamenta flava hypertrophy,  and severe left and moderate right facet hypertrophy.  There is moderately  severe foraminal narrowing, left greater than right.  There is potential  for left C4 nerve root compression.   C4-C5: There is disc bulging, 2 mm anterolisthesis and severe facet  hypertrophy.  The central canal is just minimally narrowed, not enough to  be considered spinal stenosis.  There is moderate right greater than left  foraminal narrowing that could affect the right C5 nerve root.   C5-C6: There is moderate spinal stenosis, that is more severe to the left  with apparent distortion of the left spinal cord due to bilateral disc  osteophyte complexes, facet hypertrophy and ligamenta flava hypertrophy.    There is moderately severe foraminal narrowing that could affect the C6  nerve roots.   C6-C7: There is mild spinal stenosis due to 1 to 2 mm retrolisthesis, mild  facet hypertrophy, disc bulging, uncovertebral spurring.  This causes  bilateral moderate foraminal narrowing but no definite nerve root  compression.   C7-T1: The disc and interspace appear fairly normal.   This MRI of the cervical spine without contrast shows the  following:  1.   At C2-C3, there is severe spinal stenosis.  There did not appear to  be nerve root compression at this level.  2.   At C3-C4, there is severe spinal stenosis and moderately severe  foraminal narrowing, left greater than right that could affect the left C4  nerve root.  On sagittal  images, there appears increased signal within the  spinal cord consistent  with compressive myelopathy.  3.   At C4-C5, there is anterolisthesis and borderline spinal stenosis.    Additionally, there is right greater than left foraminal narrowing that  could affect the right C5 nerve root.  4.   At C5-C6, there is moderate spinal stenosis, more severe on the left.   There is moderately severe foraminal narrowing that could affect either  of the C6 nerve roots.  5.   At C6-C7, there is mild spinal stenosis but no nerve root  compression.  6.   On the sagittal images, increased signal is noted within the pons,  most likely due to chronic microvascular ischemic change.   INTERPRETING PHYSICIAN:  Richard A. Vear, MD, PhD, FAAN  Certified in  Neuroimaging by [employer]     EXAM: X-RAY CERVICAL SPINE (AP, LAT, FLEX/EX), 05/23/2020 4:10 PM   INDICATION: cervical stenosis \ M48.02 Spinal stenosis of cervical region  COMPARISON: None.   CONCLUSION:   AP and lateral views of the cervical spine with lateral flexion and extension views and a lateral swimmer's view are submitted for evaluation. Of note, the C7-T1 level is not well evaluated on the lateral views secondary to overlying soft tissues.   1. No acute fracture. 2. 2 mm of C3 on C4 anterolisthesis in flexion reduces in extension. 3-4 mm of C4 on C5 anterolisthesis in flexion partially reduces in extension. Slight C2-3 retrolisthesis in extension reduces in flexion. 3. Focal kyphosis at C5-C6 associated with degenerative changes does not change significantly in flexion and extension. 4. Advanced degenerative disc changes are present at C5-6 and C6-7, as evidenced by near complete loss of disc space height, endplate irregularity and endplate osteophyte formation. 5. Multilevel cervical degenerative facet changes, advanced at C3-4 and C4-5. 6. Generalized osteopenia. 7. Prevertebral soft tissues are within normal limits. 8. Calcifications within the soft tissues of the right neck likely  represent carotid atherosclerosis. 9. Postsurgical changes of median sternotomy.           I have personally reviewed the procedure note and/or have reviewed and interpreted this image/images.   Electronically Signed By Attending: Niels Janifer Kato, MD on 05/23/2020  4:28 PM     EXAM: CT HEAD & CT CERVICAL SPINE WITHOUT CONTRAST on 01/12/2022  TECHNIQUE:  Multidetector CT imaging of the head and cervical spine was  performed following the standard protocol without intravenous  contrast. Multiplanar CT image reconstructions of the cervical spine  were also generated.   RADIATION DOSE REDUCTION: This exam was performed according to the  departmental dose-optimization program which includes automated  exposure control, adjustment of the mA and/or kV according to  patient size and/or use of iterative reconstruction technique.   COMPARISON:  MRI of the cervical spine March 13, 2020.   FINDINGS:  CT HEAD FINDINGS   Brain: No evidence of acute infarction, hemorrhage, hydrocephalus,  extra-axial collection or mass lesion/mass effect. Remote lacunar  infarcts in the cerebellum, left thalamus and basal ganglia.  Additional patchy white matter hypodensities, nonspecific but  compatible with chronic microvascular ischemic disease.   Vascular: Calcific atherosclerosis.   Skull: No acute fracture.   Sinuses/Orbits: Clear sinuses.  No acute orbital findings.   Other: No mastoid effusions.   CT CERVICAL SPINE FINDINGS   Alignment: Degenerative anterolisthesis of C3 on C4, C4 on C5,  similar to prior MRI. No new malalignment. Dextrocurvature.   Skull base and vertebrae: No evidence  of acute fracture.   Soft tissues and spinal canal: Visualized lung apices are clear.   Disc levels: Multilevel facet uncovertebral hypertrophy which  contributes (along with malalignment) to probably severe foraminal  stenosis at multiple levels, including C3-C4 and C4-C5.   Upper chest: Visualized  lung apices are clear.   IMPRESSION:  CT head:  1. No evidence of acute intracranial abnormality.  2. Chronic microvascular ischemic disease.   CT cervical spine:  1. No evidence of acute fracture or traumatic malalignment.  2. Probably severe foraminal stenosis at C3-C4 and C4-C5. An MRI  could further characterize if clinically warranted.   Electronically Signed    By: Gilmore GORMAN Molt M.D.    On: 01/12/2022 16:17      EXAM: CT HEAD WITHOUT CONTRAST & CT CERVICAL SPINE WITHOUT CONTRAST on 01/24/2023  TECHNIQUE:  Multidetector CT imaging of the head and cervical spine was  performed following the standard protocol without intravenous  contrast. Multiplanar CT image reconstructions of the cervical spine  were also generated.   RADIATION DOSE REDUCTION: This exam was performed according to the  departmental dose-optimization program which includes automated  exposure control, adjustment of the mA and/or kV according to  patient size and/or use of iterative reconstruction technique.   COMPARISON:  CT head 01/12/22   FINDINGS:  CT HEAD FINDINGS   Brain: No hemorrhage. No hydrocephalus. No extra-axial fluid  collection. No CT evidence of an acute cortical infarct. No mass  effect. No mass lesion. There is a chronic right cerebellar, left  thalamic, and bilateral basal ganglia infarcts.   Vascular: No hyperdense vessel or unexpected calcification.   Skull: Normal soft tissue swelling the right frontal scalp.   Sinuses/Orbits: No middle ear or mastoid effusion. Paranasal sinuses  are clear. Bilateral lens replacement. Orbits are otherwise  unremarkable.   Other: None   CT CERVICAL SPINE FINDINGS   Alignment: Grade 1 anterolisthesis of C3 on C4 and C4 on C5. Mild  retrolisthesis of C2 on C3.   Skull base and vertebrae: No acute fracture. No primary bone lesion  or focal pathologic process.   Soft tissues and spinal canal: No prevertebral fluid or swelling. No   visible canal hematoma.   Disc levels: There are scattered levels of mild-to-moderate spinal  canal narrowing, for example of the C5-C6 and C6-C7 levels.   Upper chest: Negative.   Other: No   IMPRESSION:  1. No CT evidence of intracranial injury.  2. No acute fracture or traumatic subluxation of the cervical spine.  3. Mild soft tissue swelling the right frontal scalp.   Electronically Signed    By: Lyndall Gore M.D.    On: 01/24/2023 13:48     _______________________________________________________________________________________________________  Relevant Labs:  Lab Results  Component Value Date   CREATININE 0.90 10/07/2023   Lab Results  Component Value Date   WBC 7.80 10/07/2023   HGB 14.3 10/07/2023   HCT 41.2 (L) 10/07/2023   MCV 87.2 10/07/2023   PLT 141 (L) 10/07/2023   Lab Results  Component Value Date   HGBA1C 5.1 06/10/2020   HGBA1C  06/10/2020     Comment:     Presence of heterozygote variants in patients' samples do not interfere with accurate measurements of HbA1c using Trinity affinity boronate chromatography, when no other clinical conditions affecting quality/quantity of HbA and/or quantity of hemoglobin, overall, as well as quality/quantity of RBCs are concurrently present.   Presence of homozygote variants and/or other medical conditions affecting the quality/quantity  of HbA (e.g. alpha- and beta-thalassemia) and/or quantity of hemoglobin, overall, as well as quality/quantity of RBCs (e.g. anemia of any cause) trigger inaccurate evaluations of the glycated HbA1c with any analytical method, including Trinity affinity boronate chromatography.  In these circumstances, fructosamine testing for these patients is recommended.  Hb F present at concentrations of 11% or above can interfere with accurate measurements of HbA1c.  In these circumstances, fructosamine testing for these patients is recommended.     Reviewed:  Yes _______________________________________________________________________________________________________ Diagnosis/Impression:   Spinal stenosis, cervical region -     pregabalin  (LYRICA ) 75 mg capsule; Take 1 capsule (75 mg total) by mouth 3 (three) times a day.  DDD (degenerative disc disease), cervical  Medication management    A/P 10/16/2023: Mr. Caleb [name] is a 73 y.o. male who presents to the clinic for continued management of chronic pain.  He has a PMH significant for tobacco use, alcohol use, thrombocytopenia, GERD, SIADH, HTN, HLD, CAD s/p CABG, CHF, PVD, peripheral neuropathy, chronic neck pain, cervical DDD, cervical facet arthropathy, and cervical spinal stenosis.  Noted that he was previously evaluated by Neurosurgery and felt that he would not be a good surgical candidate.  At his initial consult with Dr. Catherene in 07/2020, it was also noted that he would not be a good candidate for cervical ESI.  Therefore, the focus of his pain management has been on medication management.    Today, he reports continued and overall unchanged pain since his last telemedicine visit on 06/04/18.  His primary pain generator remains non-radiating neck pain, reported to be worse on the left side of his neck than the right.  He describes his symptoms as pinching.  Currently, he rates his average pain score as a 2/10, but notes that his symptoms can increase to a 7-8/10 at times.  He notes exacerbation of his symptoms with movement, such as neck flexion and extension.  He reports continued alleviation of his symptoms with rest and use of his current medication regimen, and denies any adverse effects associated with use of his prescribed medication.  He feels that his pain is overall well controlled and manageable with use of his current medication regimen.  At this time, given continued improvement with use of his current medication without any adverse effects, he will plan to continue with use of his  current regimen as prescribed, including Lyrica  75 mg 3 times daily.  He will plan to follow-up in 3 months for continued medication management.  In the interim, he will plan to continue to follow-up with his PCP and other specialists as scheduled/needed.   Recommended Plan of Care: - Interventional Procedures: - Not a good candidate for Cervical ESI - Per Dr. Catherene - Medical Modalities:  - Continue: Lyrica  75 mg three times daily - Last filled 07/03/23 for 90 day supply (#270) - New Post-dated Rx for 90 day supply sent to the pharmacy today  Discussed medication risks, benefits, alternatives, and side effects of medication at length. All of patient's questions and concerns addressed. - Imaging/Labs:  - None - Consults/Therapy:  - Follow-up with PCP and other specialists as scheduled/needed - Follow up: - Return in about 3 months (around 01/16/2024).  Treatment plan fully discussed and agreed upon with patient. All questions were answered.  Medical decision making for this patient was moderately complex given the independent interpretation of imaging and lab results, review of relevant records from other healthcare providers, the severity/progression of their condition, and the risk of complication  and/or morbidity that may exist with or without treatment.  Time was spent on reviewing patient PMH, medications, procedures and image, performing medically appropriate examination, and counseling and educating patient and his daughter.  Documentation on day of service.  Electronically signed by: Sherlean Jenkins New, PA-C 10/16/2023 10:26 AM       [1] Past Medical History: Diagnosis Date  . Coronary artery disease   . Hyperlipidemia   . Hypertension   . Myocardial infarction    (CMD)   . PVD (peripheral vascular disease)   [2] Past Surgical History: Procedure Laterality Date  . AORTOGRAM N/A 04/17/2017   Procedure: AORTOGRAM with run-off, RLE arteriogram;  Surgeon: Raford Jama Piggs,  MD;  Location: [facility] MAIN OR;  Service: Vascular;  Laterality: N/A;  . ARTERIOGRAM N/A 09/30/2019   Procedure: ARTERIOGRAM LEFT LOWER EXTREMITY RUNOFF WITH KISSING BALLOON ANGIOPLASTY OF BILATERAL ILIAC ARTERIES, LEFT COMMON ILIAC AND EXTERNAL ILIAC STENTING;  Surgeon: Raford Jama Piggs, MD;  Location: [facility] MAIN OR;  Service: Vascular;  Laterality: N/A;  . FEMORAL ARTERY - TIBIAL ARTERY BYPASS GRAFT Right 04/25/2017   Procedure: RIGHT FEMORAL TO BELOW KNEE POPLITEAL  BYPASS;  Surgeon: Raford Jama Piggs, MD;  Location: [facility] MAIN OR;  Service: Vascular;  Laterality: Right;  . HIP WOUND EXCISION Left 02/15/2022   Procedure: HIP WOUND EXCISION;  Surgeon: Arvis Fairy Barter, MD PhD;  Location: [facility] MAIN OR;  Service: Plastics;  Laterality: Left;  . OTHER SURGICAL HISTORY  01/2013   Procedure: OTHER SURGICAL HISTORY (Coronary Artery Triple Bypass Graft)  . TOE AMPUTATION Right 04/25/2017   Procedure: RIGHT FIRST RAY AMPUTATION;  Surgeon: Raford Jama Piggs, MD;  Location: [facility] MAIN OR;  Service: Vascular;  Laterality: Right;  [3] Family History Problem Relation Name Age of Onset  . Diabetes Mother    . Hypertension Mother    . Cancer Sister    . Lung cancer Sister    . Heart disease Brother    . Hypertension Brother    . Anesthesia problems Neg Hx    [4] Current Outpatient Medications  Medication Sig Dispense Refill  . acetaminophen  (TYLENOL ) 500 mg tablet Take 1,000 mg by mouth every 4 (four) hours as needed (pain).    . alendronate  (FOSAMAX ) 70 mg tablet Take 1 tablet (70 mg total) by mouth once a week. 90 tablet 3  . amLODIPine  (NORVASC ) 5 mg tablet Take 1 tablet (5 mg total) by mouth daily. 90 tablet 4  . aspirin  81 mg EC tablet Take 81 mg by mouth daily.    . atorvastatin  (LIPITOR ) 80 mg tablet Take 1 tablet (80 mg total) by mouth daily for cholesterol 90 tablet 1  . carvediloL  (COREG ) 25 mg tablet Take 1 tablet (25 mg total) by mouth 2 (two) times daily with a meal. 180 tablet 0  . cholecalciferol   25 mcg (1,000 unit) cap Take 1 capsule by mouth daily.    . cyanocobalamin  (VITAMIN B12) 1,000 mcg tablet Take 1,000 mcg by mouth daily.    . ferrous sulfate  (FeroSuL) 325 mg (65 mg iron) tablet Take 1 tablet by mouth 2 (two) times a day.    . fexofenadine (ALLEGRA) 180 mg tablet Take 180 mg by mouth daily.    . folic acid  (FOLVITE ) 1 mg tablet Take 1 tablet (1 mg total) by mouth daily. 90 tablet 4  . irbesartan  (AVAPRO ) 300 mg tablet Take 1 tablet (300 mg total) by mouth daily. 90 tablet 0  . pantoprazole  (PROTONIX ) 40 mg EC tablet  Take 1 tablet (40 mg total) by mouth daily. 90 tablet 0  . thiamine  (Vitamin B-1) 100 mg tablet TAKE 1 TABLET EVERY DAY 90 tablet 3  . Xarelto  2.5 mg tablet Take 1 tablet (2.5 mg total) by mouth 2 (two) times daily. 180 tablet 0  . nitroglycerin  (NITROSTAT ) 0.4 mg SL tablet Place 0.4 mg under the tongue.    . pregabalin  (LYRICA ) 75 mg capsule Take 1 capsule (75 mg total) by mouth 3 (three) times a day. 270 capsule 0   Current Facility-Administered Medications  Medication Dose Route Frequency Provider Last Rate Last Admin  . lidocaine  (XYLOCAINE ) 2 % jelly 5 mL  5 mL topical PRN Fairy Prentice Miyamoto, MD   5 mL at 05/08/23 1302  . sodium chlor-hypochlorous acid (VASHE) 0.033 % irrigation solution irsl 1 Application  1 Application topical PRN Fairy Prentice Miyamoto, MD   1 Application at 11/14/22 1018

## 2023-10-17 ENCOUNTER — Other Ambulatory Visit: Payer: Self-pay

## 2023-11-01 ENCOUNTER — Other Ambulatory Visit: Payer: Self-pay

## 2023-11-01 ENCOUNTER — Other Ambulatory Visit (HOSPITAL_COMMUNITY): Payer: Self-pay

## 2023-11-01 MED ORDER — ALENDRONATE SODIUM 70 MG PO TABS
70.0000 mg | ORAL_TABLET | ORAL | 3 refills | Status: AC
  Filled 2023-11-01: qty 12, 84d supply, fill #0
  Filled 2024-02-14: qty 12, 84d supply, fill #1

## 2023-11-01 MED ORDER — XARELTO 2.5 MG PO TABS
2.5000 mg | ORAL_TABLET | Freq: Two times a day (BID) | ORAL | 0 refills | Status: DC
  Filled 2023-11-01: qty 60, 30d supply, fill #0
  Filled 2023-12-03: qty 60, 30d supply, fill #1
  Filled 2023-12-27: qty 60, 30d supply, fill #2

## 2023-12-03 ENCOUNTER — Other Ambulatory Visit (HOSPITAL_COMMUNITY): Payer: Self-pay

## 2023-12-27 ENCOUNTER — Other Ambulatory Visit (HOSPITAL_COMMUNITY): Payer: Self-pay

## 2023-12-27 ENCOUNTER — Other Ambulatory Visit: Payer: Self-pay

## 2023-12-27 MED ORDER — ATORVASTATIN CALCIUM 80 MG PO TABS
80.0000 mg | ORAL_TABLET | Freq: Every day | ORAL | 1 refills | Status: AC
  Filled 2023-12-27: qty 90, 90d supply, fill #0

## 2023-12-27 MED ORDER — CARVEDILOL 25 MG PO TABS
25.0000 mg | ORAL_TABLET | Freq: Two times a day (BID) | ORAL | 0 refills | Status: AC
  Filled 2023-12-27: qty 180, 90d supply, fill #0

## 2023-12-27 MED ORDER — IRBESARTAN 300 MG PO TABS
300.0000 mg | ORAL_TABLET | Freq: Every day | ORAL | 0 refills | Status: AC
  Filled 2023-12-27: qty 90, 90d supply, fill #0

## 2024-01-15 ENCOUNTER — Other Ambulatory Visit: Payer: Self-pay

## 2024-01-15 ENCOUNTER — Other Ambulatory Visit (HOSPITAL_BASED_OUTPATIENT_CLINIC_OR_DEPARTMENT_OTHER): Payer: Self-pay

## 2024-01-15 MED ORDER — PREGABALIN 75 MG PO CAPS
75.0000 mg | ORAL_CAPSULE | Freq: Three times a day (TID) | ORAL | 0 refills | Status: AC
  Filled 2024-01-15 – 2024-01-24 (×2): qty 270, 90d supply, fill #0

## 2024-01-17 ENCOUNTER — Other Ambulatory Visit: Payer: Self-pay

## 2024-01-23 ENCOUNTER — Other Ambulatory Visit (HOSPITAL_COMMUNITY): Payer: Self-pay

## 2024-01-24 ENCOUNTER — Other Ambulatory Visit: Payer: Self-pay

## 2024-01-24 ENCOUNTER — Other Ambulatory Visit (HOSPITAL_COMMUNITY): Payer: Self-pay

## 2024-01-24 ENCOUNTER — Other Ambulatory Visit (HOSPITAL_BASED_OUTPATIENT_CLINIC_OR_DEPARTMENT_OTHER): Payer: Self-pay

## 2024-01-24 MED ORDER — PANTOPRAZOLE SODIUM 40 MG PO TBEC
40.0000 mg | DELAYED_RELEASE_TABLET | Freq: Every day | ORAL | 0 refills | Status: AC
  Filled 2024-01-24: qty 90, 90d supply, fill #0

## 2024-01-24 MED ORDER — XARELTO 2.5 MG PO TABS
2.5000 mg | ORAL_TABLET | Freq: Two times a day (BID) | ORAL | 0 refills | Status: AC
  Filled 2024-01-24: qty 180, 90d supply, fill #0

## 2024-01-27 ENCOUNTER — Other Ambulatory Visit (HOSPITAL_COMMUNITY): Payer: Self-pay

## 2024-02-14 ENCOUNTER — Other Ambulatory Visit: Payer: Self-pay

## 2024-05-01 ENCOUNTER — Other Ambulatory Visit

## 2024-05-08 ENCOUNTER — Ambulatory Visit: Admitting: Hematology and Oncology
